# Patient Record
Sex: Female | Born: 1955 | Race: White | Hispanic: No | Smoking: Never smoker
Health system: Southern US, Community
[De-identification: ages and names within clinical notes are randomized; demographics above are authoritative.]

## PROBLEM LIST (undated history)

## (undated) DIAGNOSIS — I639 Cerebral infarction, unspecified: Secondary | ICD-10-CM

## (undated) DIAGNOSIS — F329 Major depressive disorder, single episode, unspecified: Secondary | ICD-10-CM

## (undated) DIAGNOSIS — G500 Trigeminal neuralgia: Secondary | ICD-10-CM

## (undated) DIAGNOSIS — Z8719 Personal history of other diseases of the digestive system: Secondary | ICD-10-CM

## (undated) DIAGNOSIS — T8859XA Other complications of anesthesia, initial encounter: Secondary | ICD-10-CM

## (undated) DIAGNOSIS — I10 Essential (primary) hypertension: Secondary | ICD-10-CM

## (undated) DIAGNOSIS — S52501A Unspecified fracture of the lower end of right radius, initial encounter for closed fracture: Secondary | ICD-10-CM

## (undated) DIAGNOSIS — IMO0002 Reserved for concepts with insufficient information to code with codable children: Secondary | ICD-10-CM

## (undated) DIAGNOSIS — M1990 Unspecified osteoarthritis, unspecified site: Secondary | ICD-10-CM

## (undated) DIAGNOSIS — K219 Gastro-esophageal reflux disease without esophagitis: Secondary | ICD-10-CM

## (undated) DIAGNOSIS — K3184 Gastroparesis: Secondary | ICD-10-CM

## (undated) DIAGNOSIS — K589 Irritable bowel syndrome without diarrhea: Secondary | ICD-10-CM

## (undated) DIAGNOSIS — R112 Nausea with vomiting, unspecified: Secondary | ICD-10-CM

## (undated) DIAGNOSIS — I341 Nonrheumatic mitral (valve) prolapse: Secondary | ICD-10-CM

## (undated) DIAGNOSIS — M329 Systemic lupus erythematosus, unspecified: Secondary | ICD-10-CM

## (undated) HISTORY — DX: Cerebral infarction, unspecified: I63.9

## (undated) HISTORY — PX: COLONOSCOPY: SHX174

## (undated) HISTORY — PX: CHOLECYSTECTOMY: SHX55

## (undated) HISTORY — PX: CARPAL TUNNEL RELEASE: SHX101

---

## 1998-02-03 ENCOUNTER — Other Ambulatory Visit: Admission: RE | Admit: 1998-02-03 | Discharge: 1998-02-03 | Payer: Self-pay | Admitting: Obstetrics and Gynecology

## 2000-12-12 ENCOUNTER — Encounter: Payer: Self-pay | Admitting: Family Medicine

## 2000-12-12 ENCOUNTER — Encounter: Admission: RE | Admit: 2000-12-12 | Discharge: 2000-12-12 | Payer: Self-pay | Admitting: Family Medicine

## 2001-03-05 ENCOUNTER — Encounter: Payer: Self-pay | Admitting: Occupational Medicine

## 2001-03-05 ENCOUNTER — Encounter: Admission: RE | Admit: 2001-03-05 | Discharge: 2001-03-05 | Payer: Self-pay | Admitting: Occupational Medicine

## 2001-05-01 ENCOUNTER — Encounter: Admission: RE | Admit: 2001-05-01 | Discharge: 2001-05-01 | Payer: Self-pay | Admitting: Family Medicine

## 2001-05-01 ENCOUNTER — Encounter: Payer: Self-pay | Admitting: Family Medicine

## 2001-10-02 ENCOUNTER — Encounter: Payer: Self-pay | Admitting: Rheumatology

## 2001-10-02 ENCOUNTER — Encounter: Admission: RE | Admit: 2001-10-02 | Discharge: 2001-10-02 | Payer: Self-pay | Admitting: Rheumatology

## 2002-02-17 ENCOUNTER — Other Ambulatory Visit: Admission: RE | Admit: 2002-02-17 | Discharge: 2002-02-17 | Payer: Self-pay | Admitting: *Deleted

## 2002-05-05 ENCOUNTER — Encounter: Payer: Self-pay | Admitting: Emergency Medicine

## 2002-05-05 ENCOUNTER — Observation Stay (HOSPITAL_COMMUNITY): Admission: EM | Admit: 2002-05-05 | Discharge: 2002-05-07 | Payer: Self-pay | Admitting: Emergency Medicine

## 2003-03-18 ENCOUNTER — Encounter: Admission: RE | Admit: 2003-03-18 | Discharge: 2003-03-18 | Payer: Self-pay | Admitting: Family Medicine

## 2003-03-18 ENCOUNTER — Encounter: Payer: Self-pay | Admitting: Family Medicine

## 2003-04-02 ENCOUNTER — Encounter
Admission: RE | Admit: 2003-04-02 | Discharge: 2003-07-01 | Payer: Self-pay | Admitting: Physical Medicine & Rehabilitation

## 2004-03-22 ENCOUNTER — Encounter: Admission: RE | Admit: 2004-03-22 | Discharge: 2004-03-22 | Payer: Self-pay | Admitting: Family Medicine

## 2012-07-07 ENCOUNTER — Encounter (HOSPITAL_BASED_OUTPATIENT_CLINIC_OR_DEPARTMENT_OTHER): Payer: Self-pay

## 2012-07-07 ENCOUNTER — Emergency Department (HOSPITAL_BASED_OUTPATIENT_CLINIC_OR_DEPARTMENT_OTHER)
Admission: EM | Admit: 2012-07-07 | Discharge: 2012-07-07 | Disposition: A | Discharge status: Home / Self Care | Payer: No Typology Code available for payment source | Attending: Emergency Medicine | Admitting: Emergency Medicine

## 2012-07-07 DIAGNOSIS — Z882 Allergy status to sulfonamides status: Secondary | ICD-10-CM | POA: Insufficient documentation

## 2012-07-07 DIAGNOSIS — I10 Essential (primary) hypertension: Secondary | ICD-10-CM | POA: Insufficient documentation

## 2012-07-07 DIAGNOSIS — F3130 Bipolar disorder, current episode depressed, mild or moderate severity, unspecified: Secondary | ICD-10-CM | POA: Insufficient documentation

## 2012-07-07 DIAGNOSIS — K529 Noninfective gastroenteritis and colitis, unspecified: Secondary | ICD-10-CM

## 2012-07-07 DIAGNOSIS — Z880 Allergy status to penicillin: Secondary | ICD-10-CM | POA: Insufficient documentation

## 2012-07-07 DIAGNOSIS — K5289 Other specified noninfective gastroenteritis and colitis: Secondary | ICD-10-CM | POA: Insufficient documentation

## 2012-07-07 HISTORY — DX: Gastroparesis: K31.84

## 2012-07-07 HISTORY — DX: Essential (primary) hypertension: I10

## 2012-07-07 HISTORY — DX: Irritable bowel syndrome, unspecified: K58.9

## 2012-07-07 HISTORY — DX: Systemic lupus erythematosus, unspecified: M32.9

## 2012-07-07 HISTORY — DX: Reserved for concepts with insufficient information to code with codable children: IMO0002

## 2012-07-07 LAB — URINALYSIS, ROUTINE W REFLEX MICROSCOPIC
Hgb urine dipstick: NEGATIVE
Leukocytes, UA: NEGATIVE
Nitrite: NEGATIVE
Specific Gravity, Urine: 1.019 (ref 1.005–1.030)
Urobilinogen, UA: 0.2 mg/dL (ref 0.0–1.0)

## 2012-07-07 LAB — CBC WITH DIFFERENTIAL/PLATELET
Eosinophils Absolute: 0.1 10*3/uL (ref 0.0–0.7)
Lymphocytes Relative: 4 % — ABNORMAL LOW (ref 12–46)
Lymphs Abs: 0.4 10*3/uL — ABNORMAL LOW (ref 0.7–4.0)
MCH: 29.6 pg (ref 26.0–34.0)
Neutrophils Relative %: 90 % — ABNORMAL HIGH (ref 43–77)
Platelets: 315 10*3/uL (ref 150–400)
RBC: 4.02 MIL/uL (ref 3.87–5.11)
WBC: 11.3 10*3/uL — ABNORMAL HIGH (ref 4.0–10.5)

## 2012-07-07 LAB — BASIC METABOLIC PANEL
GFR calc non Af Amer: >90 mL/min (ref >90)
Glucose, Bld: 155 mg/dL — ABNORMAL HIGH (ref 70–99)
Potassium: 4.3 mEq/L (ref 3.5–5.1)
Sodium: 132 mEq/L — ABNORMAL LOW (ref 135–145)

## 2012-07-07 MED ORDER — LOPERAMIDE HCL 2 MG PO CAPS
4.0000 mg | ORAL_CAPSULE | Freq: Once | ORAL | Status: AC
  Administered 2012-07-07: 4 mg via ORAL
  Filled 2012-07-07 (×2): qty 1

## 2012-07-07 MED ORDER — SODIUM CHLORIDE 0.9 % IV BOLUS (SEPSIS)
1000.0000 mL | Freq: Once | INTRAVENOUS | Status: AC
  Administered 2012-07-07: 1000 mL via INTRAVENOUS

## 2012-07-07 MED ORDER — PROMETHAZINE HCL 25 MG PO TABS: 25.0000 mg | ORAL_TABLET | Freq: Four times a day (QID) | ORAL | Status: DC | PRN

## 2012-07-07 MED ORDER — ONDANSETRON HCL 4 MG/2ML IJ SOLN
4.0000 mg | Freq: Once | INTRAMUSCULAR | Status: AC
  Administered 2012-07-07: 4 mg via INTRAVENOUS
  Filled 2012-07-07: qty 2

## 2012-07-07 NOTE — ED Provider Notes (Signed)
History     CSN: [id number]  Arrival date & time 07/07/12  0546   First MD Initiated Contact with Patient 07/07/12 [phone]      Chief Complaint  Patient presents with  . Emesis    (Consider location/radiation/quality/duration/timing/severity/associated sxs/prior treatment) Patient is a 56 y.o. female presenting with vomiting. The history is provided by the patient.  Emesis   She had onset yesterday of diarrhea which was followed by nausea and vomiting. This is associated with abdominal cramping which is moderate in intensity. She rates her abdominal pain at 4/10. Abdominal pain and improves following a bowel movement the following vomiting. She denies any hematemesis or hematochezia. She denies fever, chills, sweats. Another family member has developed diarrhea without nausea and vomiting. Symptoms are severe. Nothing makes it better nothing makes it worse. She was unable to take any medications for her symptoms at home because of the vomiting.  Past Medical History  Diagnosis Date  . Lupus   . Irritable bowel disease   . Bipolar 1 disorder, depressed   . Hypertension     History reviewed. No pertinent past surgical history.  No family history on file.  History  Substance Use Topics  . Smoking status: Never Smoker   . Smokeless tobacco: Not on file  . Alcohol Use: No    OB History    Grav Para Term Preterm Abortions TAB SAB Ect Mult Living                  Review of Systems  Gastrointestinal: Positive for vomiting.  All other systems reviewed and are negative.    Allergies  Sulfa antibiotics and Penicillins  Home Medications   Current Outpatient Rx  Name Route Sig Dispense Refill  . ACYCLOVIR 800 MG PO TABS Oral Take 1,000 mg by mouth once.    . ALPRAZOLAM 0.5 MG PO TABS Oral Take 0.5 mg by mouth at bedtime as needed.    . CARBAMAZEPINE ER 300 MG PO CP12 Oral Take 300 mg by mouth 3 (three) times daily.    Marland Kitchen ESOMEPRAZOLE MAGNESIUM 40 MG PO CPDR Oral Take 40 mg  by mouth Nightly.    Marland Kitchen HYDROXYZINE PAMOATE 25 MG PO CAPS Oral Take 25 mg by mouth 3 (three) times daily as needed.    Marland Kitchen LISINOPRIL-HYDROCHLOROTHIAZIDE 20-12.5 MG PO TABS Oral Take 1 tablet by mouth daily.    Marland Kitchen RANITIDINE HCL 300 MG PO TABS Oral Take 300 mg by mouth every morning.    . TRAZODONE HCL 150 MG PO TABS Oral Take 150 mg by mouth at bedtime.    . VENLAFAXINE HCL ER 150 MG PO CP24 Oral Take 150 mg by mouth daily.      BP 123/61  Pulse 91  Temp 97.8 F (36.6 C) (Oral)  Resp 20  SpO2 100%  Physical Exam  Nursing note and vitals reviewed. 56 year old female, resting comfortably and in no acute distress. Vital signs are normal. Oxygen saturation is 100%, which is normal. Head is normocephalic and atraumatic. PERRLA, EOMI. Oropharynx is clear. Neck is nontender and supple without adenopathy or JVD. Back is nontender and there is no CVA tenderness. Lungs are clear without rales, wheezes, or rhonchi. Chest is nontender. Heart has regular rate and rhythm without murmur. Abdomen is soft, flat, nontender without masses or hepatosplenomegaly and peristalsis is hypoactive. Extremities have no cyanosis or edema, full range of motion is present. Skin is warm and dry without rash. Neurologic: Mental status is normal,  cranial nerves are intact, there are no motor or sensory deficits.   ED Course  Procedures (including critical care time)  Results for orders placed during the hospital encounter of 07/07/12  CBC WITH DIFFERENTIAL      Component Value Range   WBC 11.3 (*) 4.0 - 10.5 K/uL   RBC 4.02  3.87 - 5.11 MIL/uL   Hemoglobin 11.9 (*) 12.0 - 15.0 g/dL   HCT 09.8 (*) 11.9 - 14.7 %   MCV 84.1  78.0 - 100.0 fL   MCH 29.6  26.0 - 34.0 pg   MCHC 35.2  30.0 - 36.0 g/dL   RDW 82.9  56.2 - 13.0 %   Platelets 315  150 - 400 K/uL   Neutrophils Relative 90 (*) 43 - 77 %   Neutro Abs 10.2 (*) 1.7 - 7.7 K/uL   Lymphocytes Relative 4 (*) 12 - 46 %   Lymphs Abs 0.4 (*) 0.7 - 4.0 K/uL    Monocytes Relative 5  3 - 12 %   Monocytes Absolute 0.6  0.1 - 1.0 K/uL   Eosinophils Relative 0  0 - 5 %   Eosinophils Absolute 0.1  0.0 - 0.7 K/uL   Basophils Relative 0  0 - 1 %   Basophils Absolute 0.0  0.0 - 0.1 K/uL  BASIC METABOLIC PANEL      Component Value Range   Sodium 132 (*) 135 - 145 mEq/L   Potassium 4.3  3.5 - 5.1 mEq/L   Chloride 95 (*) 96 - 112 mEq/L   CO2 24  19 - 32 mEq/L   Glucose, Bld 155 (*) 70 - 99 mg/dL   BUN 16  6 - 23 mg/dL   Creatinine, Ser 8.65  0.50 - 1.10 mg/dL   Calcium 9.1  8.4 - 78.4 mg/dL   GFR calc non Af Amer >90  >90 mL/min   GFR calc Af Amer >90  >90 mL/min    1. Gastroenteritis       MDM  Gastroenteritis which most likely is viral. She'll be given IV hydration and IV ondansetron and oral loperamide.  She feels much better after IV hydration and IV ondansetron. Laboratory workup is unremarkable. Urinalysis is pending. She will be discharged after urinalysis results are available.      Dione Booze, MD 07/07/12 [phone]

## 2012-07-07 NOTE — ED Notes (Signed)
Report reced form JB rn- pt states feels much better. Unable to give urine. MD aware

## 2012-07-07 NOTE — ED Notes (Signed)
Patient reports that she developed nausea, vomiting and diarrhea 18 hours ago. Reports abdominal cramping for same.

## 2014-03-28 ENCOUNTER — Encounter (HOSPITAL_BASED_OUTPATIENT_CLINIC_OR_DEPARTMENT_OTHER): Payer: Self-pay | Admitting: Emergency Medicine

## 2014-03-28 ENCOUNTER — Emergency Department (HOSPITAL_BASED_OUTPATIENT_CLINIC_OR_DEPARTMENT_OTHER): Payer: BC Managed Care – PPO

## 2014-03-28 ENCOUNTER — Emergency Department (HOSPITAL_BASED_OUTPATIENT_CLINIC_OR_DEPARTMENT_OTHER)
Admission: EM | Admit: 2014-03-28 | Discharge: 2014-03-29 | Disposition: A | Discharge status: Home / Self Care | Payer: BC Managed Care – PPO | Attending: Emergency Medicine | Admitting: Emergency Medicine

## 2014-03-28 DIAGNOSIS — F329 Major depressive disorder, single episode, unspecified: Secondary | ICD-10-CM | POA: Insufficient documentation

## 2014-03-28 DIAGNOSIS — F3289 Other specified depressive episodes: Secondary | ICD-10-CM | POA: Insufficient documentation

## 2014-03-28 DIAGNOSIS — Y9389 Activity, other specified: Secondary | ICD-10-CM | POA: Insufficient documentation

## 2014-03-28 DIAGNOSIS — Y9289 Other specified places as the place of occurrence of the external cause: Secondary | ICD-10-CM | POA: Insufficient documentation

## 2014-03-28 DIAGNOSIS — S8010XA Contusion of unspecified lower leg, initial encounter: Secondary | ICD-10-CM | POA: Insufficient documentation

## 2014-03-28 DIAGNOSIS — I10 Essential (primary) hypertension: Secondary | ICD-10-CM | POA: Insufficient documentation

## 2014-03-28 DIAGNOSIS — IMO0002 Reserved for concepts with insufficient information to code with codable children: Secondary | ICD-10-CM | POA: Insufficient documentation

## 2014-03-28 DIAGNOSIS — Z880 Allergy status to penicillin: Secondary | ICD-10-CM | POA: Insufficient documentation

## 2014-03-28 DIAGNOSIS — Z23 Encounter for immunization: Secondary | ICD-10-CM | POA: Insufficient documentation

## 2014-03-28 DIAGNOSIS — Z8719 Personal history of other diseases of the digestive system: Secondary | ICD-10-CM | POA: Insufficient documentation

## 2014-03-28 DIAGNOSIS — Z79899 Other long term (current) drug therapy: Secondary | ICD-10-CM | POA: Insufficient documentation

## 2014-03-28 HISTORY — DX: Major depressive disorder, single episode, unspecified: F32.9

## 2014-03-28 MED ORDER — BACITRACIN 500 UNIT/GM EX OINT
1.0000 "application " | TOPICAL_OINTMENT | Freq: Two times a day (BID) | CUTANEOUS | Status: DC
  Administered 2014-03-28: 1 via TOPICAL
  Filled 2014-03-28: qty 0.9

## 2014-03-28 MED ORDER — HYDROCODONE-ACETAMINOPHEN 5-325 MG PO TABS: 2.0000 | ORAL_TABLET | ORAL | Status: DC | PRN

## 2014-03-28 MED ORDER — OXYCODONE-ACETAMINOPHEN 5-325 MG PO TABS
1.0000 | ORAL_TABLET | Freq: Once | ORAL | Status: AC
  Administered 2014-03-28: 1 via ORAL
  Filled 2014-03-28: qty 1

## 2014-03-28 MED ORDER — TETANUS-DIPHTH-ACELL PERTUSSIS 5-2.5-18.5 LF-MCG/0.5 IM SUSP
0.5000 mL | Freq: Once | INTRAMUSCULAR | Status: AC
  Administered 2014-03-28: 0.5 mL via INTRAMUSCULAR
  Filled 2014-03-28: qty 0.5

## 2014-03-28 NOTE — ED Provider Notes (Signed)
CSN: [id number]     Arrival date & time 03/28/14  2001 History   First MD Initiated Contact with Patient 03/28/14 2143     Chief Complaint  Patient presents with  . Foot Injury     (Consider location/radiation/quality/duration/timing/severity/associated sxs/prior Treatment) Patient is a 58 y.o. female presenting with foot injury. The history is provided by the patient.  Foot Injury Location:  Ankle and leg Injury: yes   Mechanism of injury: fall   Fall:    Fall occurred:  Down stairs and tripped   Impact surface:  Grass   Entrapped after fall: no   Ankle location:  R ankle Pain details:    Severity:  Moderate   Onset quality:  Sudden   Timing:  Constant   Progression:  Worsening Chronicity:  New Dislocation: no   Foreign body present:  No foreign bodies Prior injury to area:  Yes Worsened by:  Activity and bearing weight Associated symptoms: decreased ROM and swelling    Debbie Hatfield is a 58 y.o. female who presents to the ED with bilateral lower extremity injuries. She tripped over a fence today and went forward. She went down 2 steps and hit her right lower leg/ankle and left lower leg. She has swelling, bruising and tenderness to bilateral lower legs.   Past Medical History  Diagnosis Date  . Lupus   . Irritable bowel disease   . Hypertension   . Gastroparesis   . Major depression    Past Surgical History  Procedure Laterality Date  . Cholecystectomy     History reviewed. No pertinent family history. History  Substance Use Topics  . Smoking status: Never Smoker   . Smokeless tobacco: Not on file  . Alcohol Use: No   OB History   Grav Para Term Preterm Abortions TAB SAB Ect Mult Living                 Review of Systems Negative except as stated in HPI   Allergies  Sulfa antibiotics and Penicillins  Home Medications   Prior to Admission medications   Medication Sig Start Date End Date Taking? Authorizing Provider  acyclovir (ZOVIRAX) 800 MG  tablet Take 1,000 mg by mouth once.    Historical Provider, MD  ALPRAZolam Prudy Feeler) 0.5 MG tablet Take 0.5 mg by mouth at bedtime as needed.    Historical Provider, MD  carbamazepine (CARBATROL) 300 MG 12 hr capsule Take 300 mg by mouth 3 (three) times daily.    Historical Provider, MD  esomeprazole (NEXIUM) 40 MG capsule Take 40 mg by mouth Nightly.    Historical Provider, MD  hydrOXYzine (VISTARIL) 25 MG capsule Take 25 mg by mouth 3 (three) times daily as needed.    Historical Provider, MD  lisinopril-hydrochlorothiazide (PRINZIDE,ZESTORETIC) 20-12.5 MG per tablet Take 1 tablet by mouth daily.    Historical Provider, MD  promethazine (PHENERGAN) 25 MG tablet Take 1 tablet (25 mg total) by mouth every 6 (six) hours as needed for nausea. 07/07/12   Dione Booze, MD  ranitidine (ZANTAC) 300 MG tablet Take 300 mg by mouth every morning.    Historical Provider, MD  traZODone (DESYREL) 150 MG tablet Take 150 mg by mouth at bedtime.    Historical Provider, MD  venlafaxine XR (EFFEXOR-XR) 150 MG 24 hr capsule Take 150 mg by mouth daily.    Historical Provider, MD   BP 135/70  Pulse 74  Temp(Src) 98.3 F (36.8 C) (Oral)  Resp 16  SpO2 99% Physical  Exam  Nursing note and vitals reviewed. Constitutional: She is oriented to person, place, and time. She appears well-developed and well-nourished.  HENT:  Head: Normocephalic.  Eyes: Conjunctivae and EOM are normal.  Neck: Neck supple.  Cardiovascular: Normal rate.   Pulmonary/Chest: Effort normal.  Musculoskeletal:       Right ankle: She exhibits swelling. Lacerations: abrasion. Tenderness.       Left lower leg: She exhibits tenderness, bony tenderness and swelling. She exhibits no deformity and no laceration.       Legs:      Feet:  Tenderness and abrasions to the right ankle and lower leg.  Tenderness to left lower leg with swelling and ecchymosis noted. Pedal pulses equal bilateral, adequate circulation, good touch sensation.   Neurological:  She is alert and oriented to person, place, and time. No cranial nerve deficit.  Skin: Skin is warm and dry.  Psychiatric: She has a normal mood and affect. Her behavior is normal.    ED Course  Procedures Dg Tibia/fibula Left  03/28/2014   CLINICAL DATA:  Fall, injury.  EXAM: LEFT TIBIA AND FIBULA - 2 VIEW  COMPARISON:  None.  FINDINGS: There is no evidence of fracture or other focal bone lesions. Soft tissues are unremarkable.  IMPRESSION: Negative.   Electronically Signed   By: Awilda Metroourtnay  Bloomer   On: 03/28/2014 22:36   Dg Ankle Complete Right  03/28/2014   CLINICAL DATA:  Fall, pain.  EXAM: RIGHT ANKLE - COMPLETE 3+ VIEW  COMPARISON:  None.  FINDINGS: No fracture deformity nor dislocation. The ankle mortise appears congruent and the tibiofibular syndesmosis intact. Mild tibiotalar osteoarthrosis. No destructive bony lesions. Soft tissue planes are non-suspicious.  IMPRESSION: No acute fracture deformity or dislocation.   Electronically Signed   By: Awilda Metroourtnay  Bloomer   On: 03/28/2014 22:35    MDM  58 y.o. female with contusions to bilateral lower legs and contusion and abrasion to the right ankle. Stable for discharge with no fracture noted on x-ray and no neurovascular deficits. Ace wrap applied to left lower leg. Right ankle wounds cleaned, bacitracin ointment and dressing followed by ace wrap. Tetanus up dated, crutches, pain management. Patient will follow up with her PCP or return here for any problems. I have reviewed this patient's vital signs, nurses notes, appropriate labs and imaging.  I have discussed findings and plan of care with the patient. She voices understanding.    Medication List    TAKE these medications       HYDROcodone-acetaminophen 5-325 MG per tablet  Commonly known as:  NORCO/VICODIN  Take 2 tablets by mouth every 4 (four) hours as needed.      ASK your doctor about these medications       acyclovir 800 MG tablet  Commonly known as:  ZOVIRAX  Take 1,000 mg  by mouth once.     ALPRAZolam 0.5 MG tablet  Commonly known as:  XANAX  Take 0.5 mg by mouth at bedtime as needed.     carbamazepine 300 MG 12 hr capsule  Commonly known as:  CARBATROL  Take 300 mg by mouth 3 (three) times daily.     esomeprazole 40 MG capsule  Commonly known as:  NEXIUM  Take 40 mg by mouth Nightly.     hydrOXYzine 25 MG capsule  Commonly known as:  VISTARIL  Take 25 mg by mouth 3 (three) times daily as needed.     lisinopril-hydrochlorothiazide 20-12.5 MG per tablet  Commonly known as:  PRINZIDE,ZESTORETIC  Take 1 tablet by mouth daily.     promethazine 25 MG tablet  Commonly known as:  PHENERGAN  Take 1 tablet (25 mg total) by mouth every 6 (six) hours as needed for nausea.     ranitidine 300 MG tablet  Commonly known as:  ZANTAC  Take 300 mg by mouth every morning.     traZODone 150 MG tablet  Commonly known as:  DESYREL  Take 150 mg by mouth at bedtime.     venlafaxine XR 150 MG 24 hr capsule  Commonly known as:  EFFEXOR-XR  Take 150 mg by mouth daily.           [address]Hope DeerfieldM Neese, [place]NP 03/29/14 [phone]

## 2014-03-28 NOTE — Discharge Instructions (Signed)
Follow up with your doctor. Return here as needed for problems. °

## 2014-03-28 NOTE — ED Notes (Signed)
Pt reports while stepping ovefr dog fence twisted ankle and foot

## 2014-03-29 NOTE — ED Provider Notes (Signed)
Medical screening examination/treatment/procedure(s) were performed by non-physician practitioner and as supervising physician I was immediately available for consultation/collaboration.   EKG Interpretation None        Junius ArgyleForrest S Tine Mabee, MD 03/29/14 1545

## 2015-12-14 DIAGNOSIS — K279 Peptic ulcer, site unspecified, unspecified as acute or chronic, without hemorrhage or perforation: Secondary | ICD-10-CM

## 2015-12-14 DIAGNOSIS — M8580 Other specified disorders of bone density and structure, unspecified site: Secondary | ICD-10-CM

## 2015-12-14 HISTORY — DX: Peptic ulcer, site unspecified, unspecified as acute or chronic, without hemorrhage or perforation: K27.9

## 2015-12-14 HISTORY — DX: Other specified disorders of bone density and structure, unspecified site: M85.80

## 2016-06-01 DIAGNOSIS — M359 Systemic involvement of connective tissue, unspecified: Secondary | ICD-10-CM | POA: Insufficient documentation

## 2016-08-09 DIAGNOSIS — R000 Tachycardia, unspecified: Secondary | ICD-10-CM | POA: Insufficient documentation

## 2016-08-22 ENCOUNTER — Encounter (HOSPITAL_COMMUNITY): Payer: Self-pay | Admitting: Family Medicine

## 2016-08-22 ENCOUNTER — Ambulatory Visit (HOSPITAL_COMMUNITY)
Admission: EM | Admit: 2016-08-22 | Discharge: 2016-08-22 | Disposition: A | Discharge status: Home / Self Care | Payer: Managed Care, Other (non HMO) | Attending: Family Medicine | Admitting: Family Medicine

## 2016-08-22 DIAGNOSIS — B029 Zoster without complications: Secondary | ICD-10-CM

## 2016-08-22 HISTORY — DX: Trigeminal neuralgia: G50.0

## 2016-08-22 MED ORDER — VALACYCLOVIR HCL 1 G PO TABS: 1000.0000 mg | ORAL_TABLET | Freq: Three times a day (TID) | ORAL | 0 refills | Status: AC

## 2016-08-22 NOTE — ED Triage Notes (Signed)
Pt here for rash under left eye for a few days. sts she thinks its shingles and took extra dose of acyclovir.

## 2016-08-22 NOTE — Discharge Instructions (Signed)
I believe this is a very mild shingles outbreak based on the distribution, the paresthesias that you're feeling in the skin, and the fact that you have a shingles vaccine in the past. This shouldn't last more than 10 days and should be very mild.

## 2016-08-22 NOTE — ED Provider Notes (Signed)
[facility]    CSN: [id number] Arrival date & time: 08/22/16  1004     History   Chief Complaint Chief Complaint  Patient presents with  . Rash    HPI Debbie Hatfield is a 60 y.o. female.   This a 60 year old woman who presents with rash. She has a history of hypertension, reflux esophagitis, lupus, and trigeminal neuralgia.  She's had a rash on her face for the last 3 days. Is characterized by a tingling and burning in the skin as well as papular features. She has no scalp pain or tenderness. She has no ear pain. She's had a shingles shot in the past. She takes acyclovir daily for HSV.  Patient works as a Engineer, civil (consulting)nurse in admitting.      Past Medical History:  Diagnosis Date  . Gastroparesis   . Hypertension   . Irritable bowel disease   . Lupus   . Major depression   . Trigeminal neuralgia     There are no active problems to display for this patient.   Past Surgical History:  Procedure Laterality Date  . CHOLECYSTECTOMY      OB History    No data available       Home Medications    Prior to Admission medications   Medication Sig Start Date End Date Taking? Authorizing Provider  acyclovir (ZOVIRAX) 800 MG tablet Take 1,000 mg by mouth once.    Historical Provider, MD  carbamazepine (CARBATROL) 300 MG 12 hr capsule Take 300 mg by mouth 3 (three) times daily.    Historical Provider, MD  esomeprazole (NEXIUM) 40 MG capsule Take 40 mg by mouth Nightly.    Historical Provider, MD  HYDROcodone-acetaminophen (NORCO/VICODIN) 5-325 MG per tablet Take 2 tablets by mouth every 4 (four) hours as needed. 03/28/14   Hope Orlene OchM Neese, NP  hydrOXYzine (VISTARIL) 25 MG capsule Take 25 mg by mouth Nightly.     Historical Provider, MD  lisinopril-hydrochlorothiazide (PRINZIDE,ZESTORETIC) 20-12.5 MG per tablet Take 1 tablet by mouth daily.    Historical Provider, MD  promethazine (PHENERGAN) 25 MG tablet Take 1 tablet (25 mg total) by mouth every 6 (six) hours as needed for  nausea. 07/07/12   Dione Boozeavid Glick, MD  ranitidine (ZANTAC) 300 MG tablet Take 300 mg by mouth every morning.    Historical Provider, MD  valACYclovir (VALTREX) 1000 MG tablet Take 1 tablet (1,000 mg total) by mouth 3 (three) times daily. 08/22/16 09/05/16  Elvina SidleKurt Graylyn Bunney, MD  venlafaxine XR (EFFEXOR-XR) 150 MG 24 hr capsule Take 75 mg by mouth daily.     Historical Provider, MD    Family History History reviewed. No pertinent family history.  Social History Social History  Substance Use Topics  . Smoking status: Never Smoker  . Smokeless tobacco: Never Used  . Alcohol use No     Allergies   Sulfa antibiotics and Penicillins   Review of Systems Review of Systems  Constitutional: Negative.   HENT: Negative.   Eyes: Negative for pain, discharge, redness, itching and visual disturbance.  Respiratory: Negative.   Cardiovascular: Negative.   Neurological:       Patient has paresthesias on him under the left eye     Physical Exam Triage Vital Signs ED Triage Vitals [08/22/16 1033]  Enc Vitals Group     BP 141/76     Pulse Rate 73     Resp 18     Temp 98.1 F (36.7 C)     Temp src  SpO2 100 %     Weight      Height      Head Circumference      Peak Flow      Pain Score 4     Pain Loc      Pain Edu?      Excl. in GC?    No data found.   Updated Vital Signs BP 141/76   Pulse 73   Temp 98.1 F (36.7 C)   Resp 18   SpO2 100%      Physical Exam  Constitutional: She is oriented to person, place, and time. She appears well-developed and well-nourished.  HENT:  Patient has a dermatomal orientation to a mild papular rash which extends from the bridge of the nose under the left eye with some mild swelling there as well. She has no tenderness or rash in her scalp or ear.  Musculoskeletal: Normal range of motion.  Neurological: She is alert and oriented to person, place, and time. No cranial nerve deficit. Coordination normal.  Skin: Skin is warm and dry. Rash  noted.  Nursing note and vitals reviewed.    UC Treatments / Results  Labs (all labs ordered are listed, but only abnormal results are displayed) Labs Reviewed - No data to display  EKG  EKG Interpretation None       Radiology No results found.  Procedures Procedures (including critical care time)  Medications Ordered in UC Medications - No data to display   Initial Impression / Assessment and Plan / UC Course  I have reviewed the triage vital signs and the nursing notes.  Pertinent labs & imaging results that were available during my care of the patient were reviewed by me and considered in my medical decision making (see chart for details).  Clinical Course     Final Clinical Impressions(s) / UC Diagnoses   Final diagnoses:  Herpes zoster without complication    New Prescriptions New Prescriptions   VALACYCLOVIR (VALTREX) 1000 MG TABLET    Take 1 tablet (1,000 mg total) by mouth 3 (three) times daily.     Elvina SidleKurt Ahava Kissoon, MD 08/22/16 1048

## 2017-06-05 DIAGNOSIS — F339 Major depressive disorder, recurrent, unspecified: Secondary | ICD-10-CM | POA: Insufficient documentation

## 2019-03-04 DIAGNOSIS — M5416 Radiculopathy, lumbar region: Secondary | ICD-10-CM | POA: Insufficient documentation

## 2019-10-16 HISTORY — PX: LOOP RECORDER IMPLANT: SHX5954

## 2020-01-26 DIAGNOSIS — M51369 Other intervertebral disc degeneration, lumbar region without mention of lumbar back pain or lower extremity pain: Secondary | ICD-10-CM

## 2020-01-26 DIAGNOSIS — M5136 Other intervertebral disc degeneration, lumbar region: Secondary | ICD-10-CM | POA: Insufficient documentation

## 2020-01-26 HISTORY — DX: Other intervertebral disc degeneration, lumbar region without mention of lumbar back pain or lower extremity pain: M51.369

## 2020-09-14 ENCOUNTER — Emergency Department (HOSPITAL_COMMUNITY): Payer: Worker's Compensation

## 2020-09-14 ENCOUNTER — Emergency Department (HOSPITAL_COMMUNITY)
Admission: EM | Admit: 2020-09-14 | Discharge: 2020-09-14 | Disposition: A | Discharge status: Home / Self Care | Payer: Worker's Compensation | Attending: Emergency Medicine | Admitting: Emergency Medicine

## 2020-09-14 ENCOUNTER — Encounter (HOSPITAL_COMMUNITY): Payer: Self-pay | Admitting: Student

## 2020-09-14 ENCOUNTER — Other Ambulatory Visit: Payer: Self-pay

## 2020-09-14 DIAGNOSIS — S52571A Other intraarticular fracture of lower end of right radius, initial encounter for closed fracture: Secondary | ICD-10-CM | POA: Diagnosis not present

## 2020-09-14 DIAGNOSIS — W19XXXA Unspecified fall, initial encounter: Secondary | ICD-10-CM

## 2020-09-14 DIAGNOSIS — Y92009 Unspecified place in unspecified non-institutional (private) residence as the place of occurrence of the external cause: Secondary | ICD-10-CM | POA: Insufficient documentation

## 2020-09-14 DIAGNOSIS — W108XXA Fall (on) (from) other stairs and steps, initial encounter: Secondary | ICD-10-CM | POA: Insufficient documentation

## 2020-09-14 DIAGNOSIS — T148XXA Other injury of unspecified body region, initial encounter: Secondary | ICD-10-CM

## 2020-09-14 DIAGNOSIS — Z79899 Other long term (current) drug therapy: Secondary | ICD-10-CM | POA: Insufficient documentation

## 2020-09-14 DIAGNOSIS — I10 Essential (primary) hypertension: Secondary | ICD-10-CM | POA: Insufficient documentation

## 2020-09-14 DIAGNOSIS — Y990 Civilian activity done for income or pay: Secondary | ICD-10-CM | POA: Insufficient documentation

## 2020-09-14 DIAGNOSIS — S59911A Unspecified injury of right forearm, initial encounter: Secondary | ICD-10-CM | POA: Diagnosis present

## 2020-09-14 DIAGNOSIS — S62101A Fracture of unspecified carpal bone, right wrist, initial encounter for closed fracture: Secondary | ICD-10-CM

## 2020-09-14 NOTE — ED Provider Notes (Signed)
[facility]-EMERGENCY DEPT Provider Note   CSN: [id number] Arrival date & time: 09/14/20  1841     History Chief Complaint  Patient presents with  . Fall  . Arm Injury    Debbie Hatfield is a 64 y.o. female.  HPI Patient is a 63 year old female with a medical history as noted below. 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 She reports sudden onset, moderate, pain along the right wrist. Pain worsens with movement. Associated mild swelling in the region. No numbness, tingling, weakness. No head trauma or LOC.    Past Medical History:  Diagnosis Date  . Gastroparesis   . Hypertension   . Irritable bowel disease   . Lupus (HCC)   . Major depression   . Trigeminal neuralgia     There are no problems to display for this patient.   Past Surgical History:  Procedure Laterality Date  . CHOLECYSTECTOMY       OB History   No obstetric history on file.     History reviewed. No pertinent family history.  Social History   Tobacco Use  . Smoking status: Never Smoker  . Smokeless tobacco: Never Used  Substance Use Topics  . Alcohol use: No  . Drug use: Not on file    Home Medications Prior to Admission medications   Medication Sig Start Date End Date Taking? Authorizing Provider  acyclovir (ZOVIRAX) 800 MG tablet Take 1,000 mg by mouth once.    [provider]  carbamazepine (CARBATROL) 300 MG 12 hr capsule Take 300 mg by mouth 3 (three) times daily.    [provider]  esomeprazole (NEXIUM) 40 MG capsule Take 40 mg by mouth Nightly.    [provider]  HYDROcodone-acetaminophen (NORCO/VICODIN) 5-325 MG per tablet Take 2 tablets by mouth every 4 (four) hours as needed. 03/28/14   Janne Napoleon, NP  hydrOXYzine (VISTARIL) 25 MG capsule Take 25 mg by mouth Nightly.     [provider]   lisinopril-hydrochlorothiazide (PRINZIDE,ZESTORETIC) 20-12.5 MG per tablet Take 1 tablet by mouth daily.    [provider]  promethazine (PHENERGAN) 25 MG tablet Take 1 tablet (25 mg total) by mouth every 6 (six) hours as needed for nausea. 07/07/12   Dione Booze, MD  ranitidine (ZANTAC) 300 MG tablet Take 300 mg by mouth every morning.    [provider]  venlafaxine XR (EFFEXOR-XR) 150 MG 24 hr capsule Take 75 mg by mouth daily.     [provider]    Allergies    Sulfa antibiotics and Penicillins  Review of Systems   Review of Systems  Musculoskeletal: Positive for arthralgias and joint swelling.  Skin: Negative for wound.  Neurological: Negative for weakness and numbness.   Physical Exam Updated Vital Signs BP (!) 202/84 (BP Location: Left Arm)   Pulse 75   Temp 98.4 F (36.9 C) (Oral)   Resp 16   Ht 5\' 2"  (1.575 m)   Wt 68 kg   SpO2 100%   BMI 27.44 kg/m   Physical Exam Vitals and nursing note reviewed.  Constitutional:      General: She is not in acute distress.    Appearance: She is well-developed.  HENT:     Head: Normocephalic and atraumatic.     Right Ear: External ear normal.     Left Ear: External ear normal.  Eyes:     General: No scleral icterus.       Right eye: No discharge.        Left eye: No discharge.     Conjunctiva/sclera: Conjunctivae normal.  Neck:     Trachea: No tracheal deviation.  Cardiovascular:     Rate and Rhythm: Normal rate.  Pulmonary:     Effort: Pulmonary effort is normal. No respiratory distress.     Breath sounds: No stridor.  Abdominal:     General: There is no distension.  Musculoskeletal:        General: Swelling and tenderness present. No deformity.     Cervical back: Neck supple.     Comments: Exquisite TTP overlying the radial aspect of the right wrist. Mild hematoma noted overlying the region. Unable to assess range of motion secondary to pain. Distal sensation intact. Good cap refill.   Skin:    General: Skin is warm and dry.     Findings: No rash.  Neurological:     Mental Status: She is alert.     Cranial Nerves: Cranial nerve deficit: no gross deficits.    ED Results / Procedures / Treatments   Labs (all labs ordered are listed, but only abnormal results are displayed) Labs Reviewed - No data to display  EKG None  Radiology DG Forearm Right  Result Date: 09/14/2020 CLINICAL DATA:  Fall, pain EXAM: RIGHT FOREARM - 2 VIEW; RIGHT WRIST - COMPLETE 3+ VIEW COMPARISON:  None. FINDINGS: Comminuted impacted fracture of the distal radial metaphysis with intra-articular extension to the radiocarpal joint and possible extension into the distal radioulnar joint as well. Question a tiny nondisplaced fracture of the ulnar styloid process. No other acute fracture or traumatic osseous injury is identified. Mild degenerative changes in the wrist. Carpal bones remain in expected alignment. Dorsal ossicle noted along the first carpal arc. Circumferential soft tissue swelling of the wrist. No additional proximal radial or ulnar fractures are identified. No additional swelling or soft tissue abnormality seen in the proximal forearm. Alignment at the elbow is grossly maintained. Minimal enthesopathic changes along medial epicondyle. IMPRESSION: 1. Comminuted impacted fracture of the distal radial metaphysis with intra-articular extension to the radiocarpal joint and possible extension into the distal radioulnar joint. 2. Question tiny nondisplaced fracture of the ulnar styloid process. 3. No other acute fracture or traumatic osseous injury. 4. Circumferential swelling. 5. Degenerative changes and ossicles as above. Electronically Signed   By: Kreg Shropshire M.D.   On: 09/14/2020 19:25   DG Wrist Complete Right  Result Date: 09/14/2020 CLINICAL DATA:  Fall, pain EXAM: RIGHT FOREARM - 2 VIEW; RIGHT WRIST - COMPLETE 3+ VIEW COMPARISON:  None. FINDINGS: Comminuted impacted fracture of the distal  radial metaphysis with intra-articular extension to the radiocarpal joint and possible extension into the distal radioulnar joint as well. Question a tiny nondisplaced fracture of the ulnar styloid process. No other acute fracture or traumatic osseous injury is identified. Mild degenerative changes in the wrist. Carpal bones remain in expected alignment. Dorsal ossicle noted along the first carpal arc. Circumferential soft tissue swelling of the wrist. No additional proximal radial or ulnar fractures are identified. No additional swelling or soft tissue abnormality seen in the proximal forearm. Alignment at the elbow is grossly maintained. Minimal enthesopathic changes along medial epicondyle. IMPRESSION: 1. Comminuted impacted fracture of the distal radial metaphysis with intra-articular extension to the radiocarpal joint and possible extension into the distal radioulnar joint. 2. Question tiny nondisplaced fracture of the  ulnar styloid process. 3. No other acute fracture or traumatic osseous injury. 4. Circumferential swelling. 5. Degenerative changes and ossicles as above. Electronically Signed   By: Kreg Shropshire M.D.   On: 09/14/2020 19:25   Procedures Procedures   Medications Ordered in ED Medications - No data to display  ED Course  I have reviewed the triage vital signs and the nursing notes.  Pertinent labs & imaging results that were available during my care of the patient were reviewed by me and considered in my medical decision making (see chart for details).    MDM Rules/Calculators/A&P                          Patient is a 64 year old female who presents to the emergency department with a right wrist fracture. Findings noted above which were personally reviewed and interpreted.  I spoke to Dr. Roney Mans who is on-call for hand surgery. Recommended we place patient in a sugar tong splint and have her call the office tomorrow. They can evaluate her Friday of this week or Tuesday of next  week. This was discussed with the patient and she confirms that she will call tomorrow and is planning on going to their office this Friday.  Patient has Ultram for pain at home and plans on using this as prescribed. Knows to return to the emergency department with any new or worsening symptoms. Her questions were answered and she was amicable at the time of discharge.   Final Clinical Impression(s) / ED Diagnoses Final diagnoses:  Fall, initial encounter  Impacted comminuted fracture  Closed fracture of right wrist, initial encounter   Rx / DC Orders ED Discharge Orders    None       Placido Sou, PA-C 09/14/20 2042    Gwyneth Sprout, MD 09/18/20 0900

## 2020-09-14 NOTE — Progress Notes (Signed)
Orthopedic Tech Progress Note Patient Details:  Debbie Hatfield [birth year] [id number]  Ortho Devices Type of Ortho Device: Ace wrap, Sugartong splint Ortho Device/Splint Location: splint and sling to RUE Ortho Device/Splint Interventions: Ordered, Application   Post Interventions Patient Tolerated: Well Instructions Provided: Care of device   [name] 09/14/2020, 9:03 PM

## 2020-09-14 NOTE — ED Triage Notes (Signed)
Patient BIB POV after a fall.  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  Patient has what she believes is a fx right arm, 8/10 pain.

## 2020-09-14 NOTE — Discharge Instructions (Signed)
Like we discussed, please follow-up with Dr. Candi Leash at [facility]. They are expecting your call tomorrow. They can see you in the office this Friday or next Tuesday.  If you develop any new or worsening symptoms please return the emergency department for reevaluation. It was a pleasure to meet you.

## 2020-09-15 DIAGNOSIS — M79641 Pain in right hand: Secondary | ICD-10-CM | POA: Insufficient documentation

## 2020-09-19 ENCOUNTER — Encounter (HOSPITAL_BASED_OUTPATIENT_CLINIC_OR_DEPARTMENT_OTHER): Payer: Self-pay | Admitting: Orthopaedic Surgery

## 2020-09-19 ENCOUNTER — Encounter (HOSPITAL_BASED_OUTPATIENT_CLINIC_OR_DEPARTMENT_OTHER)
Admission: RE | Admit: 2020-09-19 | Discharge: 2020-09-19 | Disposition: A | Discharge status: Home / Self Care | Payer: PRIVATE HEALTH INSURANCE | Source: Ambulatory Visit | Attending: Orthopaedic Surgery | Admitting: Orthopaedic Surgery

## 2020-09-19 ENCOUNTER — Other Ambulatory Visit: Payer: Self-pay

## 2020-09-19 ENCOUNTER — Other Ambulatory Visit (HOSPITAL_COMMUNITY)
Admission: RE | Admit: 2020-09-19 | Discharge: 2020-09-19 | Disposition: A | Discharge status: Home / Self Care | Payer: PRIVATE HEALTH INSURANCE | Source: Ambulatory Visit | Attending: Orthopaedic Surgery | Admitting: Orthopaedic Surgery

## 2020-09-19 DIAGNOSIS — Z20822 Contact with and (suspected) exposure to covid-19: Secondary | ICD-10-CM | POA: Diagnosis not present

## 2020-09-19 DIAGNOSIS — Z01812 Encounter for preprocedural laboratory examination: Secondary | ICD-10-CM | POA: Diagnosis present

## 2020-09-19 DIAGNOSIS — I10 Essential (primary) hypertension: Secondary | ICD-10-CM | POA: Insufficient documentation

## 2020-09-19 DIAGNOSIS — M329 Systemic lupus erythematosus, unspecified: Secondary | ICD-10-CM | POA: Insufficient documentation

## 2020-09-19 DIAGNOSIS — Z01818 Encounter for other preprocedural examination: Secondary | ICD-10-CM | POA: Insufficient documentation

## 2020-09-19 LAB — BASIC METABOLIC PANEL
Anion gap: 9 (ref 5–15)
BUN: 15 mg/dL (ref 8–23)
CO2: 28 mmol/L (ref 22–32)
Calcium: 8.9 mg/dL (ref 8.9–10.3)
Chloride: 100 mmol/L (ref 98–111)
Creatinine, Ser: 1.05 mg/dL — ABNORMAL HIGH (ref 0.44–1.00)
GFR, Estimated: 60 mL/min — ABNORMAL LOW (ref >60)
Glucose, Bld: 92 mg/dL (ref 70–99)
Potassium: 4 mmol/L (ref 3.5–5.1)
Sodium: 137 mmol/L (ref 135–145)

## 2020-09-19 LAB — SARS CORONAVIRUS 2 (TAT 6-24 HRS): SARS Coronavirus 2: NEGATIVE

## 2020-09-19 NOTE — Progress Notes (Signed)

## 2020-09-22 ENCOUNTER — Encounter (HOSPITAL_BASED_OUTPATIENT_CLINIC_OR_DEPARTMENT_OTHER): Payer: Self-pay | Admitting: Orthopaedic Surgery

## 2020-09-22 ENCOUNTER — Ambulatory Visit (HOSPITAL_BASED_OUTPATIENT_CLINIC_OR_DEPARTMENT_OTHER): Payer: Worker's Compensation | Admitting: Anesthesiology

## 2020-09-22 ENCOUNTER — Encounter (HOSPITAL_BASED_OUTPATIENT_CLINIC_OR_DEPARTMENT_OTHER)
Admission: RE | Disposition: A | Discharge status: Home / Self Care | Payer: Self-pay | Source: Home / Self Care | Attending: Orthopaedic Surgery

## 2020-09-22 ENCOUNTER — Ambulatory Visit (HOSPITAL_BASED_OUTPATIENT_CLINIC_OR_DEPARTMENT_OTHER)
Admission: RE | Admit: 2020-09-22 | Discharge: 2020-09-22 | Disposition: A | Discharge status: Home / Self Care | Payer: Worker's Compensation | Attending: Orthopaedic Surgery | Admitting: Orthopaedic Surgery

## 2020-09-22 ENCOUNTER — Other Ambulatory Visit: Payer: Self-pay

## 2020-09-22 DIAGNOSIS — Z882 Allergy status to sulfonamides status: Secondary | ICD-10-CM | POA: Insufficient documentation

## 2020-09-22 DIAGNOSIS — S52571A Other intraarticular fracture of lower end of right radius, initial encounter for closed fracture: Secondary | ICD-10-CM | POA: Insufficient documentation

## 2020-09-22 DIAGNOSIS — Z79899 Other long term (current) drug therapy: Secondary | ICD-10-CM | POA: Diagnosis not present

## 2020-09-22 DIAGNOSIS — M329 Systemic lupus erythematosus, unspecified: Secondary | ICD-10-CM | POA: Insufficient documentation

## 2020-09-22 DIAGNOSIS — I10 Essential (primary) hypertension: Secondary | ICD-10-CM | POA: Insufficient documentation

## 2020-09-22 DIAGNOSIS — Z880 Allergy status to penicillin: Secondary | ICD-10-CM | POA: Insufficient documentation

## 2020-09-22 DIAGNOSIS — W19XXXA Unspecified fall, initial encounter: Secondary | ICD-10-CM | POA: Diagnosis not present

## 2020-09-22 DIAGNOSIS — Y939 Activity, unspecified: Secondary | ICD-10-CM | POA: Diagnosis not present

## 2020-09-22 HISTORY — PX: ORIF WRIST FRACTURE: SHX2133

## 2020-09-22 HISTORY — DX: Unspecified fracture of the lower end of right radius, initial encounter for closed fracture: S52.501A

## 2020-09-22 HISTORY — DX: Gastro-esophageal reflux disease without esophagitis: K21.9

## 2020-09-22 SURGERY — OPEN REDUCTION INTERNAL FIXATION (ORIF) WRIST FRACTURE
Anesthesia: Monitor Anesthesia Care | Site: Wrist | Laterality: Right

## 2020-09-22 MED ORDER — PROPOFOL 500 MG/50ML IV EMUL
INTRAVENOUS | Status: AC
  Filled 2020-09-22: qty 50

## 2020-09-22 MED ORDER — CEFAZOLIN SODIUM-DEXTROSE 2-4 GM/100ML-% IV SOLN
INTRAVENOUS | Status: AC
  Filled 2020-09-22: qty 100

## 2020-09-22 MED ORDER — PROPOFOL 500 MG/50ML IV EMUL
INTRAVENOUS | Status: DC | PRN
  Administered 2020-09-22: 125 ug/kg/min via INTRAVENOUS

## 2020-09-22 MED ORDER — LACTATED RINGERS IV SOLN: INTRAVENOUS | Status: DC

## 2020-09-22 MED ORDER — FENTANYL CITRATE (PF) 100 MCG/2ML IJ SOLN
INTRAMUSCULAR | Status: AC
  Filled 2020-09-22: qty 2

## 2020-09-22 MED ORDER — ACETAMINOPHEN 500 MG PO TABS
ORAL_TABLET | ORAL | Status: AC
  Filled 2020-09-22: qty 2

## 2020-09-22 MED ORDER — MIDAZOLAM HCL 2 MG/2ML IJ SOLN
INTRAMUSCULAR | Status: AC
  Filled 2020-09-22: qty 2

## 2020-09-22 MED ORDER — FENTANYL CITRATE (PF) 100 MCG/2ML IJ SOLN: 25.0000 ug | INTRAMUSCULAR | Status: DC | PRN

## 2020-09-22 MED ORDER — POVIDONE-IODINE 10 % EX SWAB
2.0000 "application " | Freq: Once | CUTANEOUS | Status: AC
  Administered 2020-09-22: 2 via TOPICAL

## 2020-09-22 MED ORDER — DEXAMETHASONE SODIUM PHOSPHATE 10 MG/ML IJ SOLN
INTRAMUSCULAR | Status: AC
  Filled 2020-09-22: qty 1

## 2020-09-22 MED ORDER — HYDROCODONE-ACETAMINOPHEN 5-325 MG PO TABS
1.0000 | ORAL_TABLET | Freq: Four times a day (QID) | ORAL | 0 refills | Status: DC | PRN
Start: 2020-09-22 — End: 2021-01-12

## 2020-09-22 MED ORDER — ACETAMINOPHEN 500 MG PO TABS
1000.0000 mg | ORAL_TABLET | Freq: Once | ORAL | Status: AC
  Administered 2020-09-22: 1000 mg via ORAL

## 2020-09-22 MED ORDER — LIDOCAINE 2% (20 MG/ML) 5 ML SYRINGE
INTRAMUSCULAR | Status: AC
  Filled 2020-09-22: qty 5

## 2020-09-22 MED ORDER — ONDANSETRON HCL 4 MG/2ML IJ SOLN
INTRAMUSCULAR | Status: DC | PRN
  Administered 2020-09-22: 4 mg via INTRAVENOUS

## 2020-09-22 MED ORDER — MIDAZOLAM HCL 2 MG/2ML IJ SOLN
2.0000 mg | Freq: Once | INTRAMUSCULAR | Status: AC
  Administered 2020-09-22: 2 mg via INTRAVENOUS

## 2020-09-22 MED ORDER — CEFAZOLIN SODIUM-DEXTROSE 2-4 GM/100ML-% IV SOLN
2.0000 g | INTRAVENOUS | Status: AC
  Administered 2020-09-22: 2 g via INTRAVENOUS

## 2020-09-22 MED ORDER — LIDOCAINE HCL (CARDIAC) PF 100 MG/5ML IV SOSY
PREFILLED_SYRINGE | INTRAVENOUS | Status: DC | PRN
  Administered 2020-09-22: 40 mg via INTRAVENOUS

## 2020-09-22 MED ORDER — FENTANYL CITRATE (PF) 100 MCG/2ML IJ SOLN
INTRAMUSCULAR | Status: DC | PRN
  Administered 2020-09-22 (×2): 50 ug via INTRAVENOUS

## 2020-09-22 MED ORDER — BUPIVACAINE-EPINEPHRINE (PF) 0.5% -1:200000 IJ SOLN
INTRAMUSCULAR | Status: DC | PRN
  Administered 2020-09-22: 30 mL via PERINEURAL

## 2020-09-22 MED ORDER — FENTANYL CITRATE (PF) 100 MCG/2ML IJ SOLN
50.0000 ug | Freq: Once | INTRAMUSCULAR | Status: AC
  Administered 2020-09-22: 50 ug via INTRAVENOUS

## 2020-09-22 MED ORDER — CHLORHEXIDINE GLUCONATE 4 % EX LIQD: 60.0000 mL | Freq: Once | CUTANEOUS | Status: DC

## 2020-09-22 MED ORDER — ONDANSETRON HCL 4 MG/2ML IJ SOLN
INTRAMUSCULAR | Status: AC
  Filled 2020-09-22: qty 2

## 2020-09-22 SURGICAL SUPPLY — 81 items
APL PRP STRL LF DISP 70% ISPRP (MISCELLANEOUS) ×1
BIT DRILL 2.0 LNG QUCK RELEASE (BIT) ×1 IMPLANT
BIT DRILL QC 2.8X5 (BIT) ×3 IMPLANT
BLADE CLIPPER SURG (BLADE) IMPLANT
BLADE SURG 15 STRL LF DISP TIS (BLADE) ×2 IMPLANT
BLADE SURG 15 STRL SS (BLADE) ×6
BNDG CMPR 9X4 STRL LF SNTH (GAUZE/BANDAGES/DRESSINGS) ×1
BNDG CONFORM 2 STRL LF (GAUZE/BANDAGES/DRESSINGS) IMPLANT
BNDG ELASTIC 3X5.8 VLCR STR LF (GAUZE/BANDAGES/DRESSINGS) ×3 IMPLANT
BNDG ELASTIC 4X5.8 VLCR STR LF (GAUZE/BANDAGES/DRESSINGS) IMPLANT
BNDG ESMARK 4X9 LF (GAUZE/BANDAGES/DRESSINGS) ×3 IMPLANT
BNDG GAUZE ELAST 4 BULKY (GAUZE/BANDAGES/DRESSINGS) ×6 IMPLANT
BRUSH SCRUB EZ PLAIN DRY (MISCELLANEOUS) IMPLANT
CANISTER SUCT 1200ML W/VALVE (MISCELLANEOUS) ×3 IMPLANT
CHLORAPREP W/TINT 26 (MISCELLANEOUS) ×3 IMPLANT
CORD BIPOLAR FORCEPS 12FT (ELECTRODE) ×3 IMPLANT
COVER BACK TABLE 60X90IN (DRAPES) ×3 IMPLANT
COVER WAND RF STERILE (DRAPES) IMPLANT
CUFF TOURN SGL QUICK 18X4 (TOURNIQUET CUFF) ×3 IMPLANT
DECANTER SPIKE VIAL GLASS SM (MISCELLANEOUS) IMPLANT
DRAPE EXTREMITY T 121X128X90 (DISPOSABLE) ×3 IMPLANT
DRAPE OEC MINIVIEW 54X84 (DRAPES) ×3 IMPLANT
DRAPE SURG 17X23 STRL (DRAPES) ×3 IMPLANT
DRILL 2.0 LNG QUICK RELEASE (BIT) ×3
DRSG ADAPTIC 3X8 NADH LF (GAUZE/BANDAGES/DRESSINGS) IMPLANT
DRSG EMULSION OIL 3X3 NADH (GAUZE/BANDAGES/DRESSINGS) IMPLANT
GAUZE 4X4 16PLY RFD (DISPOSABLE) IMPLANT
GAUZE SPONGE 4X4 12PLY STRL (GAUZE/BANDAGES/DRESSINGS) ×3 IMPLANT
GAUZE XEROFORM 1X8 LF (GAUZE/BANDAGES/DRESSINGS) ×3 IMPLANT
GAUZE XEROFORM 5X9 LF (GAUZE/BANDAGES/DRESSINGS) IMPLANT
GLOVE ECLIPSE 6.5 STRL STRAW (GLOVE) ×3 IMPLANT
GLOVE SRG 8 PF TXTR STRL LF DI (GLOVE) ×1 IMPLANT
GLOVE SURG SYN 7.5  E (GLOVE)
GLOVE SURG SYN 7.5 E (GLOVE) IMPLANT
GLOVE SURG UNDER POLY LF SZ8 (GLOVE) ×3
GOWN STRL REUS W/ TWL LRG LVL3 (GOWN DISPOSABLE) ×1 IMPLANT
GOWN STRL REUS W/ TWL XL LVL3 (GOWN DISPOSABLE) ×1 IMPLANT
GOWN STRL REUS W/TWL LRG LVL3 (GOWN DISPOSABLE) ×3
GOWN STRL REUS W/TWL XL LVL3 (GOWN DISPOSABLE) ×3
GUIDEWIRE ORTHO 0.054X6 (WIRE) ×3 IMPLANT
NEEDLE HYPO 22GX1.5 SAFETY (NEEDLE) IMPLANT
NEEDLE HYPO 25X1 1.5 SAFETY (NEEDLE) IMPLANT
NS IRRIG 1000ML POUR BTL (IV SOLUTION) ×3 IMPLANT
PACK BASIN DAY SURGERY FS (CUSTOM PROCEDURE TRAY) ×3 IMPLANT
PAD CAST 3X4 CTTN HI CHSV (CAST SUPPLIES) ×2 IMPLANT
PAD CAST 4YDX4 CTTN HI CHSV (CAST SUPPLIES) ×1 IMPLANT
PADDING CAST ABS 3INX4YD NS (CAST SUPPLIES)
PADDING CAST ABS 4INX4YD NS (CAST SUPPLIES) ×2
PADDING CAST ABS COTTON 3X4 (CAST SUPPLIES) IMPLANT
PADDING CAST ABS COTTON 4X4 ST (CAST SUPPLIES) ×1 IMPLANT
PADDING CAST COTTON 3X4 STRL (CAST SUPPLIES) ×6
PADDING CAST COTTON 4X4 STRL (CAST SUPPLIES) ×3
PLATE ACULOCK 2 NARROW RT (Plate) ×3 IMPLANT
SCREW BN FT 16X2.3XLCK HEX CRT (Screw) ×2 IMPLANT
SCREW CORT FT 18X2.3XLCK HEX (Screw) ×4 IMPLANT
SCREW CORTICAL LOCKING 2.3X16M (Screw) ×6 IMPLANT
SCREW CORTICAL LOCKING 2.3X18M (Screw) ×12 IMPLANT
SCREW LOCK 12X3.5X HEXALOBE (Screw) ×1 IMPLANT
SCREW LOCKING 3.5X10MM (Screw) ×3 IMPLANT
SCREW LOCKING 3.5X12 (Screw) ×3 IMPLANT
SCREW NON LOCK 3.5X10MM (Screw) ×3 IMPLANT
SHEET MEDIUM DRAPE 40X70 STRL (DRAPES) ×3 IMPLANT
SPLINT FIBERGLASS 3X35 (CAST SUPPLIES) ×3 IMPLANT
SPLINT FIBERGLASS 4X30 (CAST SUPPLIES) IMPLANT
SPLINT PLASTER CAST XFAST 3X15 (CAST SUPPLIES) IMPLANT
SPLINT PLASTER XTRA FASTSET 3X (CAST SUPPLIES)
STOCKINETTE SYNTHETIC 3 UNSTER (CAST SUPPLIES) ×3 IMPLANT
SUCTION FRAZIER HANDLE 10FR (MISCELLANEOUS) ×3
SUCTION TUBE FRAZIER 10FR DISP (MISCELLANEOUS) ×1 IMPLANT
SUT ETHIBOND 3-0 V-5 (SUTURE) IMPLANT
SUT PROLENE 4 0 PS 2 18 (SUTURE) ×6 IMPLANT
SUT VIC AB 3-0 FS2 27 (SUTURE) ×3 IMPLANT
SYR BULB EAR ULCER 3OZ GRN STR (SYRINGE) ×3 IMPLANT
SYR CONTROL 10ML LL (SYRINGE) IMPLANT
SYSTEM CHEST DRAIN TLS 7FR (DRAIN) IMPLANT
TAPE SURG TRANSPORE 1 IN (GAUZE/BANDAGES/DRESSINGS) ×1 IMPLANT
TAPE SURGICAL TRANSPORE 1 IN (GAUZE/BANDAGES/DRESSINGS) ×3
TOWEL GREEN STERILE FF (TOWEL DISPOSABLE) ×6 IMPLANT
TUBE CONNECTING 20'X1/4 (TUBING) ×1
TUBE CONNECTING 20X1/4 (TUBING) ×2 IMPLANT
UNDERPAD 30X36 HEAVY ABSORB (UNDERPADS AND DIAPERS) ×3 IMPLANT

## 2020-09-22 NOTE — Discharge Instructions (Signed)
Discharge Instructions  - Keep dressings in place. Do not remove them. - The dressings must stay dry - Take all medication as prescribed. Transition to over the counter pain medication as your pain improves - Keep the hand elevated over the next 48-72 hours to help with pain and swelling - Move all digits not restricted by the dressings regularly to prevent stiffness - Please call to schedule a follow up appointment with Dr. Roney Mans and therapy at [phone] for 10-14 days following surgery - Your pain medication have been sent digitally to your pharmacy   Post Anesthesia Home Care Instructions  Activity: Get plenty of rest for the remainder of the day. A responsible individual must stay with you for 24 hours following the procedure.  For the next 24 hours, DO NOT: -Drive a car -Advertising copywriter -Drink alcoholic beverages -Take any medication unless instructed by your physician -Make any legal decisions or sign important papers.  Meals: Start with liquid foods such as gelatin or soup. Progress to regular foods as tolerated. Avoid greasy, spicy, heavy foods. If nausea and/or vomiting occur, drink only clear liquids until the nausea and/or vomiting subsides. Call your physician if vomiting continues.  Special Instructions/Symptoms: Your throat may feel dry or sore from the anesthesia or the breathing tube placed in your throat during surgery. If this causes discomfort, gargle with warm salt water. The discomfort should disappear within 24 hours.  If you had a scopolamine patch placed behind your ear for the management of post- operative nausea and/or vomiting:  1. The medication in the patch is effective for 72 hours, after which it should be removed.  Wrap patch in a tissue and discard in the trash. Wash hands thoroughly with soap and water. 2. You may remove the patch earlier than 72 hours if you experience unpleasant side effects which may include dry mouth, dizziness or visual  disturbances. 3. Avoid touching the patch. Wash your hands with soap and water after contact with the patch.    Regional Anesthesia Blocks  1. Numbness or the inability to move the "blocked" extremity may last from 3-48 hours after placement. The length of time depends on the medication injected and your individual response to the medication. If the numbness is not going away after 48 hours, call your surgeon.  2. The extremity that is blocked will need to be protected until the numbness is gone and the  Strength has returned. Because you cannot feel it, you will need to take extra care to avoid injury. Because it may be weak, you may have difficulty moving it or using it. You may not know what position it is in without looking at it while the block is in effect.  3. For blocks in the legs and feet, returning to weight bearing and walking needs to be done carefully. You will need to wait until the numbness is entirely gone and the strength has returned. You should be able to move your leg and foot normally before you try and bear weight or walk. You will need someone to be with you when you first try to ensure you do not fall and possibly risk injury.  4. Bruising and tenderness at the needle site are common side effects and will resolve in a few days.  5. Persistent numbness or new problems with movement should be communicated to the surgeon or the [facility] [phone] [facility] [phone]).  NO TYLENOL BEFORE 4pm, IF NEEDED.

## 2020-09-22 NOTE — Transfer of Care (Signed)
Immediate Anesthesia Transfer of Care Note  Patient: Debbie Hatfield  Procedure(s) Performed: OPEN REDUCTION INTERNAL FIXATION (ORIF) WRIST FRACTURE (Right Wrist)  Patient Location: PACU  Anesthesia Type:MAC  Level of Consciousness: awake and alert   Airway & Oxygen Therapy: Patient Spontanous Breathing and Patient connected to nasal cannula oxygen  Post-op Assessment: Report given to RN and Post -op Vital signs reviewed and stable  Post vital signs: Reviewed and stable  Last Vitals:  Vitals Value Taken Time  BP    Temp    Pulse 87 09/22/20 1155  Resp 18 09/22/20 1155  SpO2 99 % 09/22/20 1155  Vitals shown include unvalidated device data.  Last Pain:  Vitals:   09/22/20 0906  TempSrc: Oral  PainSc: 4       Patients Stated Pain Goal: 2 (67/61/95 0932)  Complications: No complications documented.

## 2020-09-22 NOTE — Anesthesia Procedure Notes (Signed)
Anesthesia Regional Block: Supraclavicular block   Pre-Anesthetic Checklist: ,, timeout performed, Correct Patient, Correct Site, Correct Laterality, Correct Procedure, Correct Position, site marked, Risks and benefits discussed, pre-op evaluation,  At surgeon's request and post-op pain management  Laterality: Right  Prep: Maximum Sterile Barrier Precautions used, chloraprep       Needles:  Injection technique: Single-shot  Needle Type: Echogenic Stimulator Needle     Needle Length: 5cm  Needle Gauge: 22     Additional Needles:   Procedures:,,,, ultrasound used (permanent image in chart),,,,  Narrative:  Start time: 09/22/2020 9:46 AM End time: 09/22/2020 9:56 AM Injection made incrementally with aspirations every 5 mL. Anesthesiologist: Gaynelle Adu, MD  Additional Notes: 2% Lidocaine skin wheel.

## 2020-09-22 NOTE — Op Note (Signed)
PREOPERATIVE DIAGNOSIS: Displaced right three-part intra-articular distal radius fracture  POSTOPERATIVE DIAGNOSIS: Same  ATTENDING PHYSICIAN: Maudry Mayhew. Jeannie Fend, III, MD who was present and scrubbed for the entire case   ASSISTANT SURGEON: None.   ANESTHESIA: Regional with MAC  SURGICAL PROCEDURES: Open reduction and internal fixation of right three-part intra-articular distal radius fracture  SURGICAL INDICATIONS: Patient is a 64 year old female who last had a fall onto an outstretched right hand.  She had immediate pain in the right wrist and was seen at [facility] long ER.  She was found to have a intra-articular displaced radius fracture.  She was then seen by me in clinic.  We discussed treatment options going forward and due to the loss of articular height and inclination of the distal radius fracture, did recommend proceeding forward with open reduction and internal fixation to reestablish the normal contour of her articular surface.  After discussing treatment options she did wish to proceed forward with surgery and presents today for fixation of her right wrist.  FINDING: Anatomic alignment of the three-part intra-articular distal radius fracture was achieved and stable fixation using a volar locking plate screw construct.  DESCRIPTION OF PROCEDURE: Patient was identified in the preoperative holding area where the risk benefits and alternatives of the procedure were discussed with the patient.  These risks include but are not limited to infection, bleeding, damage to surrounding structures including blood vessels and nerves, pain, stiffness, malunion, nonunion, implant failure need for additional procedures.  After having this discussion informed consent was obtained the patient's right wrist was marked with a surgical marking pen.  She then underwent a right upper extremity plexus block by anesthesia.  She was then brought to the operative suite where timeout was performed identifying the  correct patient operative site.  Patient was positioned supine on the operative table and induced under MAC sedation.  Preoperative antibiotics were administered.  A tourniquet was placed on the upper arm and the right upper extremity was then prepped and draped in usual sterile fashion.  The limb was exsanguinated and the tourniquet was inflated.  A longitudinal incision was made over the volar radial wrist overlying the FCR tendon.  Sharp dissection was carried down through subcutaneous tissue.  The FCR tendon sheath was incised and the tendon was mobilized ulnarly.  The deep FCR tendon sheath was then incised and the FPL tendon was bluntly mobilized ulnarly as well.  15 blade was then used to incise the pronator quadratus which was then elevated off the volar distal radius using a wood handle elevator.  This revealed the distal radius fracture.  This was distal along the volar wrist with intra-articular split creating both a lunate and scaphoid fossa fragments.  Manipulation of these fragments was performed in anatomic alignment of the pieces was achieved using palmar translation of the hand and ulnar deviation of the wrist.  While holding this in position the narrow width distally fitting Acumed volar locking plate was placed along the volar distal radius and pinned in the position.  K wires were placed to help hold reduction.  A screw was then placed into the oblong hole into the proximal portion of the plate to allow for some manipulation of the plate into good position along the volar rim of the distal radius.  A distal row of locking screws were then placed while holding the distal radius in a reduced position.  Fluoroscopic images were then obtained including both AP and lateral images.  This showed anatomic reduction of the  intra-articular distal radius fracture with appropriate position of the volar locking plate and screws.  At this point the remaining holes in the distal row of the plate were filled  as where the two additional locking screws placed proximally into the radial shaft.  Additional fluoroscopic images were obtained which showed appropriate screw lengths throughout as well as appropriate plate and screw position along the volar distal radius.  The wrist was taken through series range of motion and found to have full flexion, extension, pronation and supination of the DRUJ was found to be stable in both pronation and supination.  The wound was then copiously irrigated with normal saline.  The skin was then closed with interrupted 4-0 Prolene sutures.  Xeroform, 4 x 4's and well-padded volar slab splint was then placed.  The tourniquet was released and the patient had return of brisk capillary refill to all of her digits.  She was awoken from her sedation and taken the PACU in stable condition.  She tolerated the procedure well and there were no complications.  RADIOGRAPHIC INTERPRETATION: AP, lateral and fossa lateral images were obtained intraoperative under fluoroscopy.  These show anatomic alignment of the previously displaced and intra-articular 3 part distal radius fracture with volar locking plate and screw construct.  No new fractures or dislocations are noted.  ESTIMATED BLOOD LOSS: Less than 10 mL  TOURNIQUET TIME: 30 minutes  SPECIMENS: None  POSTOPERATIVE PLAN: The patient will be discharged home and seen back  in the office in approximately 10-12 days for wound check, suture  removal, and then be sent to a therapist for volar wrist splint, early range of motion and edema control exercises.  IMPLANTS: Acumed distally fitting, narrow volar locking plate and screw construct.

## 2020-09-22 NOTE — Anesthesia Preprocedure Evaluation (Signed)
Anesthesia Evaluation  Patient identified by MRN, date of birth, ID band Patient awake    Reviewed: Allergy & Precautions, H&P , NPO status , Patient's Chart, lab work & pertinent test results  Airway Mallampati: II  TM Distance: >3 FB Neck ROM: Full    Dental no notable dental hx. (+) Teeth Intact, Dental Advisory Given   Pulmonary neg pulmonary ROS,    Pulmonary exam normal breath sounds clear to auscultation       Cardiovascular hypertension, Pt. on medications  Rhythm:Regular Rate:Normal     Neuro/Psych Depression negative neurological ROS  negative psych ROS   GI/Hepatic Neg liver ROS, GERD  Medicated,  Endo/Other  negative endocrine ROS  Renal/GU negative Renal ROS  negative genitourinary   Musculoskeletal   Abdominal   Peds  Hematology negative hematology ROS (+)   Anesthesia Other Findings   Reproductive/Obstetrics negative OB ROS                             Anesthesia Physical Anesthesia Plan  ASA: II  Anesthesia Plan: MAC and Regional   Post-op Pain Management:    Induction: Intravenous  PONV Risk Score and Plan: 2 and Propofol infusion, Ondansetron, Dexamethasone and Midazolam  Airway Management Planned: Simple Face Mask  Additional Equipment:   Intra-op Plan:   Post-operative Plan:   Informed Consent: I have reviewed the patients History and Physical, chart, labs and discussed the procedure including the risks, benefits and alternatives for the proposed anesthesia with the patient or authorized representative who has indicated his/her understanding and acceptance.     Dental advisory given  Plan Discussed with: CRNA  Anesthesia Plan Comments:         Anesthesia Quick Evaluation

## 2020-09-22 NOTE — Progress Notes (Signed)
Assisted Dr. Edmond Fitzgerald with right, ultrasound guided, supraclavicular block. Side rails up, monitors on throughout procedure. See vital signs in flow sheet. Tolerated Procedure well. 

## 2020-09-22 NOTE — Anesthesia Postprocedure Evaluation (Signed)
Anesthesia Post Note  Patient: Debbie Hatfield  Procedure(s) Performed: OPEN REDUCTION INTERNAL FIXATION (ORIF) WRIST FRACTURE (Right Wrist)     Patient location during evaluation: PACU Anesthesia Type: Regional and MAC Level of consciousness: awake and alert Pain management: pain level controlled Vital Signs Assessment: post-procedure vital signs reviewed and stable Respiratory status: spontaneous breathing, nonlabored ventilation and respiratory function stable Cardiovascular status: stable and blood pressure returned to baseline Postop Assessment: no apparent nausea or vomiting Anesthetic complications: no   No complications documented.  Last Vitals:  Vitals:   09/22/20 1200 09/22/20 1201  BP: (!) 155/76   Pulse: 80 80  Resp: 17 16  Temp: (!) 36.4 C   SpO2: 99% 99%    Last Pain:  Vitals:   09/22/20 1154  TempSrc:   PainSc: 0-No pain                 Imanuel Pruiett,[name]

## 2020-09-22 NOTE — H&P (Signed)
ORTHOPAEDIC H&P  PCP:  [facility]  Chief Complaint: Right wrist pain  HPI: Debbie Hatfield is a 64 y.o. female who complains of right wrist pain.  Patient fell last week onto outstretched right hand.  She had immediate pain and swelling to her right wrist and was seen at [facility] long ER.  She was found to have an impacted and displaced intra-articular distal radius fracture.  She was then sent to see me in clinic.  Based on the amount of displacement and step-off of her articular surface we discussed both operative and nonoperative treatment measures.  She did wish to proceed forward with surgical fixation of her right wrist and she presents today for that.  She states her pain is been controlled.  She denies numbness or tingling in the digits.  She has been n.p.o.  Past Medical History:  Diagnosis Date  . Closed fracture of right distal radius   . Gastroparesis   . GERD (gastroesophageal reflux disease)   . Hypertension   . Irritable bowel disease   . Lupus (HCC)   . Major depression   . Trigeminal neuralgia    Past Surgical History:  Procedure Laterality Date  . CHOLECYSTECTOMY    . COLONOSCOPY     Social History   Socioeconomic History  . Marital status: Divorced    Spouse name: Not on file  . Number of children: Not on file  . Years of education: Not on file  . Highest education level: Not on file  Occupational History  . Not on file  Tobacco Use  . Smoking status: Never Smoker  . Smokeless tobacco: Never Used  Substance and Sexual Activity  . Alcohol use: Yes    Comment: social  . Drug use: Never  . Sexual activity: Not Currently    Birth control/protection: Post-menopausal  Other Topics Concern  . Not on file  Social History Narrative  . Not on file   Social Determinants of Health   Financial Resource Strain: Not on file  Food Insecurity: Not on file  Transportation Needs: Not on file  Physical Activity: Not on file   Stress: Not on file  Social Connections: Not on file   History reviewed. No pertinent family history. Allergies  Allergen Reactions  . Sulfa Antibiotics Nausea And Vomiting  . Penicillins Rash   Prior to Admission medications   Medication Sig Start Date End Date Taking? Authorizing Provider  acyclovir (ZOVIRAX) 800 MG tablet Take 1,000 mg by mouth once.   Yes [provider]  carbamazepine (CARBATROL) 300 MG 12 hr capsule Take 300 mg by mouth daily.    Yes [provider]  esomeprazole (NEXIUM) 40 MG capsule Take 40 mg by mouth Nightly.   Yes [provider]  gabapentin (NEURONTIN) 300 MG capsule Take 600 mg by mouth 3 (three) times daily.   Yes [provider]  HYDROcodone-acetaminophen (NORCO/VICODIN) 5-325 MG per tablet Take 2 tablets by mouth every 4 (four) hours as needed. 03/28/14  Yes Neese, Hope M, NP  hydroxychloroquine (PLAQUENIL) 200 MG tablet Take 200 mg by mouth daily.   Yes [provider]  lisinopril-hydrochlorothiazide (PRINZIDE,ZESTORETIC) 20-12.5 MG per tablet Take 1 tablet by mouth daily.   Yes [provider]  traMADol (ULTRAM) 50 MG tablet Take 50 mg by mouth every 6 (six) hours as needed.   Yes [provider]  venlafaxine XR (EFFEXOR-XR) 150 MG 24 hr capsule Take 75 mg by mouth daily.  Yes [provider]   No results found.  Positive ROS: All other systems have been reviewed and were otherwise negative with the exception of those mentioned in the HPI and as above.  Physical Exam: General: Alert, no acute distress Cardiovascular: No edema Respiratory: No cyanosis, no use of accessory musculature Skin: No lesions in the area of chief complaint Psychiatric: Patient is competent for consent with normal mood and affect  MUSCULOSKELETAL: Sugar tong splint in place.  Intact motion through the digits.  Fingertips are warm well perfused with brisk capillary refill.  Her sensation is grossly  intact light touch throughout all digits.  Assessment: Displaced intra-articular right distal radius fracture  Plan: Proceed forward with open reduction and into fixation of the right distal radius fracture.  Risk, benefits and alternatives of the procedure were once again discussed with the patient.  These risks include but are not limited to infection, bleeding, damage to surrounding structures including blood vessels and nerves, pain, stiffness, malunion, nonunion, implant failure and need for additional procedures.  Informed consent was obtained the patient's right hand was marked.  Plan for discharge home postoperatively with follow-up with me in approximate 10 to 14 days.    Ernest Mallick, MD [phone]   09/22/2020 9:12 AM

## 2020-09-23 ENCOUNTER — Encounter (HOSPITAL_BASED_OUTPATIENT_CLINIC_OR_DEPARTMENT_OTHER): Payer: Self-pay | Admitting: Orthopaedic Surgery

## 2020-12-29 ENCOUNTER — Encounter (HOSPITAL_BASED_OUTPATIENT_CLINIC_OR_DEPARTMENT_OTHER): Payer: Self-pay | Admitting: *Deleted

## 2020-12-29 ENCOUNTER — Other Ambulatory Visit: Payer: Self-pay

## 2020-12-29 ENCOUNTER — Emergency Department (HOSPITAL_BASED_OUTPATIENT_CLINIC_OR_DEPARTMENT_OTHER): Payer: PRIVATE HEALTH INSURANCE

## 2020-12-29 ENCOUNTER — Emergency Department (HOSPITAL_BASED_OUTPATIENT_CLINIC_OR_DEPARTMENT_OTHER)
Admission: EM | Admit: 2020-12-29 | Discharge: 2020-12-29 | Disposition: A | Discharge status: Home / Self Care | Payer: PRIVATE HEALTH INSURANCE | Attending: Emergency Medicine | Admitting: Emergency Medicine

## 2020-12-29 DIAGNOSIS — Z23 Encounter for immunization: Secondary | ICD-10-CM | POA: Insufficient documentation

## 2020-12-29 DIAGNOSIS — S0990XA Unspecified injury of head, initial encounter: Secondary | ICD-10-CM | POA: Diagnosis present

## 2020-12-29 DIAGNOSIS — Z79899 Other long term (current) drug therapy: Secondary | ICD-10-CM | POA: Diagnosis not present

## 2020-12-29 DIAGNOSIS — W1812XA Fall from or off toilet with subsequent striking against object, initial encounter: Secondary | ICD-10-CM | POA: Insufficient documentation

## 2020-12-29 DIAGNOSIS — Y92002 Bathroom of unspecified non-institutional (private) residence single-family (private) house as the place of occurrence of the external cause: Secondary | ICD-10-CM | POA: Insufficient documentation

## 2020-12-29 DIAGNOSIS — I10 Essential (primary) hypertension: Secondary | ICD-10-CM | POA: Insufficient documentation

## 2020-12-29 DIAGNOSIS — R55 Syncope and collapse: Secondary | ICD-10-CM | POA: Diagnosis not present

## 2020-12-29 DIAGNOSIS — M542 Cervicalgia: Secondary | ICD-10-CM | POA: Diagnosis not present

## 2020-12-29 DIAGNOSIS — S0031XA Abrasion of nose, initial encounter: Secondary | ICD-10-CM

## 2020-12-29 DIAGNOSIS — R040 Epistaxis: Secondary | ICD-10-CM | POA: Diagnosis not present

## 2020-12-29 DIAGNOSIS — S0083XA Contusion of other part of head, initial encounter: Secondary | ICD-10-CM | POA: Insufficient documentation

## 2020-12-29 DIAGNOSIS — S0093XA Contusion of unspecified part of head, initial encounter: Secondary | ICD-10-CM

## 2020-12-29 LAB — BASIC METABOLIC PANEL
Anion gap: 9 (ref 5–15)
BUN: 16 mg/dL (ref 8–23)
CO2: 29 mmol/L (ref 22–32)
Calcium: 9.3 mg/dL (ref 8.9–10.3)
Chloride: 100 mmol/L (ref 98–111)
Creatinine, Ser: 0.89 mg/dL (ref 0.44–1.00)
GFR, Estimated: >60 mL/min (ref >60)
Glucose, Bld: 99 mg/dL (ref 70–99)
Potassium: 3.8 mmol/L (ref 3.5–5.1)
Sodium: 138 mmol/L (ref 135–145)

## 2020-12-29 LAB — CBC
HCT: 38.3 % (ref 36.0–46.0)
Hemoglobin: 12.4 g/dL (ref 12.0–15.0)
MCH: 28.7 pg (ref 26.0–34.0)
MCHC: 32.4 g/dL (ref 30.0–36.0)
MCV: 88.7 fL (ref 80.0–100.0)
Platelets: 295 10*3/uL (ref 150–400)
RBC: 4.32 MIL/uL (ref 3.87–5.11)
RDW: 13.1 % (ref 11.5–15.5)
WBC: 6.9 10*3/uL (ref 4.0–10.5)
nRBC: 0 % (ref 0.0–0.2)

## 2020-12-29 MED ORDER — BACITRACIN ZINC 500 UNIT/GM EX OINT
TOPICAL_OINTMENT | Freq: Once | CUTANEOUS | Status: AC
  Administered 2020-12-29: 1 via TOPICAL
  Filled 2020-12-29: qty 28.35

## 2020-12-29 MED ORDER — ACETAMINOPHEN 500 MG PO TABS
1000.0000 mg | ORAL_TABLET | Freq: Once | ORAL | Status: AC
  Administered 2020-12-29: 1000 mg via ORAL
  Filled 2020-12-29: qty 2

## 2020-12-29 MED ORDER — TETANUS-DIPHTH-ACELL PERTUSSIS 5-2.5-18.5 LF-MCG/0.5 IM SUSY
PREFILLED_SYRINGE | INTRAMUSCULAR | Status: AC
  Filled 2020-12-29: qty 0.5

## 2020-12-29 MED ORDER — TETANUS-DIPHTH-ACELL PERTUSSIS 5-2.5-18.5 LF-MCG/0.5 IM SUSY
0.5000 mL | PREFILLED_SYRINGE | Freq: Once | INTRAMUSCULAR | Status: AC
  Administered 2020-12-29: 0.5 mL via INTRAMUSCULAR

## 2020-12-29 NOTE — ED Notes (Signed)
Patient stated that she wants to do the wound care herself and her friend (both are nurses).  She just want to take the Bacitracin with her.

## 2020-12-29 NOTE — ED Triage Notes (Addendum)
Patient fainted last night while she was bearing down in her bathroom.  Patient also c/o being dizzy and nausea.

## 2020-12-29 NOTE — ED Provider Notes (Addendum)
[facility] EMERGENCY DEPT Provider Note   CSN: [id number] Arrival date & time: 12/29/20  4098     History Chief Complaint  Patient presents with  . Fall  . Dizziness  . Nausea    Debbie Hatfield is a 65 y.o. female.  Patient c/o syncope and fall last pm. 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 Brief loc. Was able to get up under own power. This AM, has been ambulatory. Called friend who brought to ED. C/o pain to back of head, superior neck, and face - symptoms acute onset, moderate, dull, worse w palpation. No radicular pain. No numbness/weakness. No nausea/vomiting. No change in vision. Nose bled initially, now stopped. No anticoag use. Prior to syncope/fall felt fine, at baseline. No palpitations. No chest pain or discomfort. No sob or unusual doe. No leg pain or swelling. No recent blood loss. No change of meds/new meds.   The history is provided by the patient.  Fall Pertinent negatives include no chest pain, no abdominal pain, no headaches and no shortness of breath.  Dizziness Associated symptoms: no chest pain, no headaches, no palpitations, no shortness of breath and no vomiting        Past Medical History:  Diagnosis Date  . Closed fracture of right distal radius   . Gastroparesis   . GERD (gastroesophageal reflux disease)   . Hypertension   . Irritable bowel disease   . Lupus (HCC)   . Major depression   . Trigeminal neuralgia     There are no problems to display for this patient.   Past Surgical History:  Procedure Laterality Date  . CHOLECYSTECTOMY    . COLONOSCOPY    . ORIF WRIST FRACTURE Right 09/22/2020   Procedure: OPEN REDUCTION INTERNAL FIXATION (ORIF) WRIST FRACTURE;  Surgeon: Ernest Mallick, MD;  Location: [facility];  Service: Orthopedics;  Laterality: Right;      OB History   No obstetric history on file.     History  reviewed. No pertinent family history.  Social History   Tobacco Use  . Smoking status: Never Smoker  . Smokeless tobacco: Never Used  Substance Use Topics  . Alcohol use: Yes    Comment: social  . Drug use: Never    Home Medications Prior to Admission medications   Medication Sig Start Date End Date Taking? Authorizing Provider  acyclovir (ZOVIRAX) 800 MG tablet Take 1,000 mg by mouth once.    [provider]  carbamazepine (CARBATROL) 300 MG 12 hr capsule Take 300 mg by mouth daily.     [provider]  esomeprazole (NEXIUM) 40 MG capsule Take 40 mg by mouth Nightly.    [provider]  gabapentin (NEURONTIN) 300 MG capsule Take 600 mg by mouth 3 (three) times daily.    [provider]  HYDROcodone-acetaminophen (NORCO/VICODIN) 5-325 MG tablet Take 1-2 tablets by mouth every 6 (six) hours as needed. 09/22/20   Cain Saupe III, MD  hydroxychloroquine (PLAQUENIL) 200 MG tablet Take 200 mg by mouth daily.    [provider]  lisinopril-hydrochlorothiazide (PRINZIDE,ZESTORETIC) 20-12.5 MG per tablet Take 1 tablet by mouth daily.    [provider]  traMADol (ULTRAM) 50 MG tablet Take 50 mg by mouth every 6 (six) hours as needed.    [provider]  venlafaxine XR (EFFEXOR-XR) 150 MG 24 hr capsule Take 75 mg by mouth daily.  [provider]    Allergies    Sulfa antibiotics and Penicillins  Review of Systems   Review of Systems  Constitutional: Negative for fever.  HENT: Positive for nosebleeds.   Eyes: Negative for pain and visual disturbance.  Respiratory: Negative for cough and shortness of breath.   Cardiovascular: Negative for chest pain and palpitations.  Gastrointestinal: Negative for abdominal pain and vomiting.  Genitourinary: Negative for dysuria and flank pain.  Musculoskeletal: Positive for neck pain. Negative for back pain.  Skin:       Contusion/abrasion to nose.   Neurological:  Positive for syncope and light-headedness. Negative for headaches.  Hematological: Does not bruise/bleed easily.  Psychiatric/Behavioral: Negative for confusion.    Physical Exam Updated Vital Signs BP (!) 143/123 (BP Location: Right Arm)   Pulse 84   Resp 15   Ht 1.575 m (5\' 2" )   Wt 68 kg   SpO2 100%   BMI 27.44 kg/m   Physical Exam Vitals and nursing note reviewed.  Constitutional:      Appearance: Normal appearance. She is well-developed.  HENT:     Head:     Comments: Contusion, sts/tenderness, to bridge or nose and right cheek/inferior orbit. No nasal septal hematoma. No malocclusion.     Right Ear: Tympanic membrane normal.     Left Ear: Tympanic membrane normal.     Nose: Nose normal.     Mouth/Throat:     Mouth: Mucous membranes are moist.  Eyes:     General: No scleral icterus.    Conjunctiva/sclera: Conjunctivae normal.     Pupils: Pupils are equal, round, and reactive to light.  Neck:     Vascular: No carotid bruit.     Trachea: No tracheal deviation.  Cardiovascular:     Rate and Rhythm: Normal rate and regular rhythm.     Pulses: Normal pulses.     Heart sounds: Normal heart sounds. No murmur heard. No friction rub. No gallop.   Pulmonary:     Effort: Pulmonary effort is normal. No respiratory distress.     Breath sounds: Normal breath sounds.  Chest:     Chest wall: No tenderness.  Abdominal:     General: Bowel sounds are normal. There is no distension.     Palpations: Abdomen is soft. There is no mass.     Tenderness: There is no abdominal tenderness.  Genitourinary:    Comments: No cva tenderness.  Musculoskeletal:        General: No swelling.     Cervical back: Normal range of motion and neck supple. Tenderness present. No rigidity. No muscular tenderness.     Comments: Tenderness upper cervical area, otherwise, CTLS spine, non tender, aligned, no step off. Good rom bil ext without pain or focal bony tenderness.   Skin:    General: Skin is  warm and dry.     Findings: No rash.  Neurological:     Mental Status: She is alert.     Comments: Alert, speech normal. GCS 15. Motor/sens grossly intact bil. Steady gait.   Psychiatric:        Mood and Affect: Mood normal.     ED Results / Procedures / Treatments   Labs (all labs ordered are listed, but only abnormal results are displayed) Results for orders placed or performed during the hospital encounter of 12/29/20  CBC  Result Value Ref Range   WBC 6.9 4.0 - 10.5 K/uL   RBC 4.32 3.87 - 5.11 MIL/uL  Hemoglobin 12.4 12.0 - 15.0 g/dL   HCT 16.1 09.6 - 04.5 %   MCV 88.7 80.0 - 100.0 fL   MCH 28.7 26.0 - 34.0 pg   MCHC 32.4 30.0 - 36.0 g/dL   RDW 40.9 81.1 - 91.4 %   Platelets 295 150 - 400 K/uL   nRBC 0.0 0.0 - 0.2 %  Basic metabolic panel  Result Value Ref Range   Sodium 138 135 - 145 mmol/L   Potassium 3.8 3.5 - 5.1 mmol/L   Chloride 100 98 - 111 mmol/L   CO2 29 22 - 32 mmol/L   Glucose, Bld 99 70 - 99 mg/dL   BUN 16 8 - 23 mg/dL   Creatinine, Ser 7.82 0.44 - 1.00 mg/dL   Calcium 9.3 8.9 - 95.6 mg/dL   GFR, Estimated >21 >30 mL/min   Anion gap 9 5 - 15   CT HEAD WO CONTRAST  Result Date: 12/29/2020 CLINICAL DATA:  Larey Seat last night with trauma to the head, face and neck. EXAM: CT HEAD WITHOUT CONTRAST CT MAXILLOFACIAL WITHOUT CONTRAST CT CERVICAL SPINE WITHOUT CONTRAST TECHNIQUE: Multidetector CT imaging of the head, cervical spine, and maxillofacial structures were performed using the standard protocol without intravenous contrast. Multiplanar CT image reconstructions of the cervical spine and maxillofacial structures were also generated. COMPARISON:  None. FINDINGS: CT HEAD FINDINGS Brain: The brain shows a normal appearance without evidence of malformation, atrophy, old or acute small or large vessel infarction, mass lesion, hemorrhage, hydrocephalus or extra-axial collection. Vascular: No hyperdense vessel. No evidence of atherosclerotic calcification. Skull: Normal.   No traumatic finding.  No focal bone lesion. Sinuses/Orbits: Sinuses are clear. Orbits appear normal. Mastoids are clear. Other: None significant CT MAXILLOFACIAL FINDINGS Osseous: No facial fracture. Orbits: No orbital injury. Sinuses: Clear Soft tissues: Soft tissue swelling of the nose. CT CERVICAL SPINE FINDINGS Alignment: No traumatic malalignment. Straightening of the normal cervical lordosis, which could simply be positional. Skull base and vertebrae: No regional fracture. Soft tissues and spinal canal: Normal Disc levels: No degenerative disc disease. No facet arthropathy. No stenosis. Upper chest: Normal Other: None IMPRESSION: HEAD CT: Normal. MAXILLOFACIAL CT: Soft tissue swelling of the nose. No facial fracture. CERVICAL SPINE CT: No traumatic finding. Straightening of the normal cervical lordosis, which could simply be positional. Electronically Signed   By: Paulina Fusi M.D.   On: 12/29/2020 11:33   CT CERVICAL SPINE WO CONTRAST  Result Date: 12/29/2020 CLINICAL DATA:  Larey Seat last night with trauma to the head, face and neck. EXAM: CT HEAD WITHOUT CONTRAST CT MAXILLOFACIAL WITHOUT CONTRAST CT CERVICAL SPINE WITHOUT CONTRAST TECHNIQUE: Multidetector CT imaging of the head, cervical spine, and maxillofacial structures were performed using the standard protocol without intravenous contrast. Multiplanar CT image reconstructions of the cervical spine and maxillofacial structures were also generated. COMPARISON:  None. FINDINGS: CT HEAD FINDINGS Brain: The brain shows a normal appearance without evidence of malformation, atrophy, old or acute small or large vessel infarction, mass lesion, hemorrhage, hydrocephalus or extra-axial collection. Vascular: No hyperdense vessel. No evidence of atherosclerotic calcification. Skull: Normal.  No traumatic finding.  No focal bone lesion. Sinuses/Orbits: Sinuses are clear. Orbits appear normal. Mastoids are clear. Other: None significant CT MAXILLOFACIAL FINDINGS  Osseous: No facial fracture. Orbits: No orbital injury. Sinuses: Clear Soft tissues: Soft tissue swelling of the nose. CT CERVICAL SPINE FINDINGS Alignment: No traumatic malalignment. Straightening of the normal cervical lordosis, which could simply be positional. Skull base and vertebrae: No regional fracture. Soft tissues and  spinal canal: Normal Disc levels: No degenerative disc disease. No facet arthropathy. No stenosis. Upper chest: Normal Other: None IMPRESSION: HEAD CT: Normal. MAXILLOFACIAL CT: Soft tissue swelling of the nose. No facial fracture. CERVICAL SPINE CT: No traumatic finding. Straightening of the normal cervical lordosis, which could simply be positional. Electronically Signed   By: Paulina FusiMark  Shogry M.D.   On: 12/29/2020 11:33   CT Maxillofacial WO CM  Result Date: 12/29/2020 CLINICAL DATA:  Larey SeatFell last night with trauma to the head, face and neck. EXAM: CT HEAD WITHOUT CONTRAST CT MAXILLOFACIAL WITHOUT CONTRAST CT CERVICAL SPINE WITHOUT CONTRAST TECHNIQUE: Multidetector CT imaging of the head, cervical spine, and maxillofacial structures were performed using the standard protocol without intravenous contrast. Multiplanar CT image reconstructions of the cervical spine and maxillofacial structures were also generated. COMPARISON:  None. FINDINGS: CT HEAD FINDINGS Brain: The brain shows a normal appearance without evidence of malformation, atrophy, old or acute small or large vessel infarction, mass lesion, hemorrhage, hydrocephalus or extra-axial collection. Vascular: No hyperdense vessel. No evidence of atherosclerotic calcification. Skull: Normal.  No traumatic finding.  No focal bone lesion. Sinuses/Orbits: Sinuses are clear. Orbits appear normal. Mastoids are clear. Other: None significant CT MAXILLOFACIAL FINDINGS Osseous: No facial fracture. Orbits: No orbital injury. Sinuses: Clear Soft tissues: Soft tissue swelling of the nose. CT CERVICAL SPINE FINDINGS Alignment: No traumatic malalignment.  Straightening of the normal cervical lordosis, which could simply be positional. Skull base and vertebrae: No regional fracture. Soft tissues and spinal canal: Normal Disc levels: No degenerative disc disease. No facet arthropathy. No stenosis. Upper chest: Normal Other: None IMPRESSION: HEAD CT: Normal. MAXILLOFACIAL CT: Soft tissue swelling of the nose. No facial fracture. CERVICAL SPINE CT: No traumatic finding. Straightening of the normal cervical lordosis, which could simply be positional. Electronically Signed   By: Paulina FusiMark  Shogry M.D.   On: 12/29/2020 11:33    EKG EKG Interpretation  Date/Time:  Thursday December 29 2020 10:06:09 EDT Ventricular Rate:  74 PR Interval:    QRS Duration: 77 QT Interval:  379 QTC Calculation: 421 R Axis:   46 Text Interpretation: Sinus rhythm Borderline prolonged PR interval Confirmed by Cathren LaineSteinl, Schyler Butikofer ([id number]) on 12/29/2020 10:08:50 AM   Radiology CT HEAD WO CONTRAST  Result Date: 12/29/2020 CLINICAL DATA:  Larey SeatFell last night with trauma to the head, face and neck. EXAM: CT HEAD WITHOUT CONTRAST CT MAXILLOFACIAL WITHOUT CONTRAST CT CERVICAL SPINE WITHOUT CONTRAST TECHNIQUE: Multidetector CT imaging of the head, cervical spine, and maxillofacial structures were performed using the standard protocol without intravenous contrast. Multiplanar CT image reconstructions of the cervical spine and maxillofacial structures were also generated. COMPARISON:  None. FINDINGS: CT HEAD FINDINGS Brain: The brain shows a normal appearance without evidence of malformation, atrophy, old or acute small or large vessel infarction, mass lesion, hemorrhage, hydrocephalus or extra-axial collection. Vascular: No hyperdense vessel. No evidence of atherosclerotic calcification. Skull: Normal.  No traumatic finding.  No focal bone lesion. Sinuses/Orbits: Sinuses are clear. Orbits appear normal. Mastoids are clear. Other: None significant CT MAXILLOFACIAL FINDINGS Osseous: No facial fracture. Orbits:  No orbital injury. Sinuses: Clear Soft tissues: Soft tissue swelling of the nose. CT CERVICAL SPINE FINDINGS Alignment: No traumatic malalignment. Straightening of the normal cervical lordosis, which could simply be positional. Skull base and vertebrae: No regional fracture. Soft tissues and spinal canal: Normal Disc levels: No degenerative disc disease. No facet arthropathy. No stenosis. Upper chest: Normal Other: None IMPRESSION: HEAD CT: Normal. MAXILLOFACIAL CT: Soft tissue swelling of the nose.  No facial fracture. CERVICAL SPINE CT: No traumatic finding. Straightening of the normal cervical lordosis, which could simply be positional. Electronically Signed   By: Paulina Fusi M.D.   On: 12/29/2020 11:33   CT CERVICAL SPINE WO CONTRAST  Result Date: 12/29/2020 CLINICAL DATA:  Larey Seat last night with trauma to the head, face and neck. EXAM: CT HEAD WITHOUT CONTRAST CT MAXILLOFACIAL WITHOUT CONTRAST CT CERVICAL SPINE WITHOUT CONTRAST TECHNIQUE: Multidetector CT imaging of the head, cervical spine, and maxillofacial structures were performed using the standard protocol without intravenous contrast. Multiplanar CT image reconstructions of the cervical spine and maxillofacial structures were also generated. COMPARISON:  None. FINDINGS: CT HEAD FINDINGS Brain: The brain shows a normal appearance without evidence of malformation, atrophy, old or acute small or large vessel infarction, mass lesion, hemorrhage, hydrocephalus or extra-axial collection. Vascular: No hyperdense vessel. No evidence of atherosclerotic calcification. Skull: Normal.  No traumatic finding.  No focal bone lesion. Sinuses/Orbits: Sinuses are clear. Orbits appear normal. Mastoids are clear. Other: None significant CT MAXILLOFACIAL FINDINGS Osseous: No facial fracture. Orbits: No orbital injury. Sinuses: Clear Soft tissues: Soft tissue swelling of the nose. CT CERVICAL SPINE FINDINGS Alignment: No traumatic malalignment. Straightening of the normal  cervical lordosis, which could simply be positional. Skull base and vertebrae: No regional fracture. Soft tissues and spinal canal: Normal Disc levels: No degenerative disc disease. No facet arthropathy. No stenosis. Upper chest: Normal Other: None IMPRESSION: HEAD CT: Normal. MAXILLOFACIAL CT: Soft tissue swelling of the nose. No facial fracture. CERVICAL SPINE CT: No traumatic finding. Straightening of the normal cervical lordosis, which could simply be positional. Electronically Signed   By: Paulina Fusi M.D.   On: 12/29/2020 11:33   CT Maxillofacial WO CM  Result Date: 12/29/2020 CLINICAL DATA:  Larey Seat last night with trauma to the head, face and neck. EXAM: CT HEAD WITHOUT CONTRAST CT MAXILLOFACIAL WITHOUT CONTRAST CT CERVICAL SPINE WITHOUT CONTRAST TECHNIQUE: Multidetector CT imaging of the head, cervical spine, and maxillofacial structures were performed using the standard protocol without intravenous contrast. Multiplanar CT image reconstructions of the cervical spine and maxillofacial structures were also generated. COMPARISON:  None. FINDINGS: CT HEAD FINDINGS Brain: The brain shows a normal appearance without evidence of malformation, atrophy, old or acute small or large vessel infarction, mass lesion, hemorrhage, hydrocephalus or extra-axial collection. Vascular: No hyperdense vessel. No evidence of atherosclerotic calcification. Skull: Normal.  No traumatic finding.  No focal bone lesion. Sinuses/Orbits: Sinuses are clear. Orbits appear normal. Mastoids are clear. Other: None significant CT MAXILLOFACIAL FINDINGS Osseous: No facial fracture. Orbits: No orbital injury. Sinuses: Clear Soft tissues: Soft tissue swelling of the nose. CT CERVICAL SPINE FINDINGS Alignment: No traumatic malalignment. Straightening of the normal cervical lordosis, which could simply be positional. Skull base and vertebrae: No regional fracture. Soft tissues and spinal canal: Normal Disc levels: No degenerative disc disease.  No facet arthropathy. No stenosis. Upper chest: Normal Other: None IMPRESSION: HEAD CT: Normal. MAXILLOFACIAL CT: Soft tissue swelling of the nose. No facial fracture. CERVICAL SPINE CT: No traumatic finding. Straightening of the normal cervical lordosis, which could simply be positional. Electronically Signed   By: Paulina Fusi M.D.   On: 12/29/2020 11:33    Procedures Procedures   Medications Ordered in ED Medications - No data to display  ED Course  I have reviewed the triage vital signs and the nursing notes.  Pertinent labs & imaging results that were available during my care of the patient were reviewed by me and considered  in my medical decision making (see chart for details).    MDM Rules/Calculators/A&P                          Continuous pulse ox and cardiac monitoring. Ecg. Labs. Imaging ordered.   Reviewed nursing notes and prior charts for additional history.   Labs reviewed/interpreted by me hgb normal.   CT reviewed/interpreted by me - no hem.   Acetaminophen po. Po fluids. Ambulate in hall.  Pt appears stable for d/c.   Return precautions provided.      Final Clinical Impression(s) / ED Diagnoses Final diagnoses:  None    Rx / DC Orders ED Discharge Orders    None           Cathren Laine, MD 12/29/20 1225

## 2020-12-29 NOTE — Discharge Instructions (Signed)
It was our pleasure to provide your ER care today - we hope that you feel better.  Icepack to sore area/face.   Take your pain medication as need.   If you begin to feel faint, lie down immediately, so as to help decrease chance of fall and injury.   Follow up with primary care doctor in the next couple of weeks - also have blood pressure rechecked then as it is high today.   Return to ER if worse, new symptoms, fevers, new, worsening or severe pain, recurrent fainting, chest pain, racing heart beat, or other concern.

## 2020-12-29 NOTE — ED Notes (Signed)
ED Provider at bedside. 

## 2021-01-07 ENCOUNTER — Emergency Department (HOSPITAL_BASED_OUTPATIENT_CLINIC_OR_DEPARTMENT_OTHER)
Admission: EM | Admit: 2021-01-07 | Discharge: 2021-01-07 | Disposition: A | Discharge status: Home / Self Care | Payer: PRIVATE HEALTH INSURANCE | Attending: Emergency Medicine | Admitting: Emergency Medicine

## 2021-01-07 ENCOUNTER — Other Ambulatory Visit: Payer: Self-pay

## 2021-01-07 ENCOUNTER — Emergency Department (HOSPITAL_BASED_OUTPATIENT_CLINIC_OR_DEPARTMENT_OTHER): Payer: PRIVATE HEALTH INSURANCE | Admitting: Radiology

## 2021-01-07 ENCOUNTER — Emergency Department (HOSPITAL_BASED_OUTPATIENT_CLINIC_OR_DEPARTMENT_OTHER): Payer: PRIVATE HEALTH INSURANCE

## 2021-01-07 DIAGNOSIS — S060X1A Concussion with loss of consciousness of 30 minutes or less, initial encounter: Secondary | ICD-10-CM | POA: Insufficient documentation

## 2021-01-07 DIAGNOSIS — R002 Palpitations: Secondary | ICD-10-CM | POA: Diagnosis not present

## 2021-01-07 DIAGNOSIS — Z79899 Other long term (current) drug therapy: Secondary | ICD-10-CM | POA: Insufficient documentation

## 2021-01-07 DIAGNOSIS — Y9301 Activity, walking, marching and hiking: Secondary | ICD-10-CM | POA: Insufficient documentation

## 2021-01-07 DIAGNOSIS — R0789 Other chest pain: Secondary | ICD-10-CM | POA: Diagnosis not present

## 2021-01-07 DIAGNOSIS — S0990XA Unspecified injury of head, initial encounter: Secondary | ICD-10-CM | POA: Diagnosis present

## 2021-01-07 DIAGNOSIS — W19XXXA Unspecified fall, initial encounter: Secondary | ICD-10-CM | POA: Insufficient documentation

## 2021-01-07 DIAGNOSIS — I10 Essential (primary) hypertension: Secondary | ICD-10-CM | POA: Insufficient documentation

## 2021-01-07 DIAGNOSIS — S060X9A Concussion with loss of consciousness of unspecified duration, initial encounter: Secondary | ICD-10-CM

## 2021-01-07 LAB — CBC WITH DIFFERENTIAL/PLATELET
Abs Immature Granulocytes: 0 10*3/uL (ref 0.00–0.07)
Basophils Absolute: 0 10*3/uL (ref 0.0–0.1)
Basophils Relative: 1 %
Eosinophils Absolute: 0.1 10*3/uL (ref 0.0–0.5)
Eosinophils Relative: 2 %
HCT: 39.2 % (ref 36.0–46.0)
Hemoglobin: 12.7 g/dL (ref 12.0–15.0)
Immature Granulocytes: 0 %
Lymphocytes Relative: 40 %
Lymphs Abs: 2.3 10*3/uL (ref 0.7–4.0)
MCH: 28.7 pg (ref 26.0–34.0)
MCHC: 32.4 g/dL (ref 30.0–36.0)
MCV: 88.7 fL (ref 80.0–100.0)
Monocytes Absolute: 0.8 10*3/uL (ref 0.1–1.0)
Monocytes Relative: 14 %
Neutro Abs: 2.5 10*3/uL (ref 1.7–7.7)
Neutrophils Relative %: 43 %
Platelets: 338 10*3/uL (ref 150–400)
RBC: 4.42 MIL/uL (ref 3.87–5.11)
RDW: 12.9 % (ref 11.5–15.5)
WBC: 5.7 10*3/uL (ref 4.0–10.5)
nRBC: 0 % (ref 0.0–0.2)

## 2021-01-07 LAB — BASIC METABOLIC PANEL
Anion gap: 11 (ref 5–15)
BUN: 16 mg/dL (ref 8–23)
CO2: 25 mmol/L (ref 22–32)
Calcium: 9.2 mg/dL (ref 8.9–10.3)
Chloride: 103 mmol/L (ref 98–111)
Creatinine, Ser: 0.9 mg/dL (ref 0.44–1.00)
GFR, Estimated: >60 mL/min (ref >60)
Glucose, Bld: 111 mg/dL — ABNORMAL HIGH (ref 70–99)
Potassium: 3.7 mmol/L (ref 3.5–5.1)
Sodium: 139 mmol/L (ref 135–145)

## 2021-01-07 LAB — URINALYSIS, ROUTINE W REFLEX MICROSCOPIC
Bilirubin Urine: NEGATIVE
Glucose, UA: NEGATIVE mg/dL
Hgb urine dipstick: NEGATIVE
Ketones, ur: NEGATIVE mg/dL
Leukocytes,Ua: NEGATIVE
Nitrite: NEGATIVE
Protein, ur: NEGATIVE mg/dL
Specific Gravity, Urine: 1.017 (ref 1.005–1.030)
pH: 6 (ref 5.0–8.0)

## 2021-01-07 LAB — TROPONIN I (HIGH SENSITIVITY)
Troponin I (High Sensitivity): 6 ng/L (ref <18)
Troponin I (High Sensitivity): 7 ng/L (ref <18)

## 2021-01-07 MED ORDER — SODIUM CHLORIDE 0.9 % IV BOLUS
1000.0000 mL | Freq: Once | INTRAVENOUS | Status: AC
  Administered 2021-01-07: 1000 mL via INTRAVENOUS

## 2021-01-07 MED ORDER — ACETAMINOPHEN 500 MG PO TABS
1000.0000 mg | ORAL_TABLET | Freq: Once | ORAL | Status: AC
  Administered 2021-01-07: 1000 mg via ORAL
  Filled 2021-01-07: qty 2

## 2021-01-07 NOTE — ED Triage Notes (Signed)
Pt c/o high HR, up to 123, & high BP x4 days. Seen last week for fall; states dizzy, L sd headache, & visual disturbances since.

## 2021-01-07 NOTE — Discharge Instructions (Addendum)
Follow-up with your primary care doctor as discussed.  Refrain from driving until your symptoms have improved.  I have given you a referral to follow-up with the cardiologist regarding your recurrent syncopal episodes.  You will need to call make an appointment.  Return here as needed for any worsening symptoms.

## 2021-01-07 NOTE — ED Notes (Signed)
Advised by lab that trop is being sent out due to machine still being worked on.

## 2021-01-07 NOTE — ED Provider Notes (Signed)
[facility] EMERGENCY DEPT Provider Note   CSN: [id number] Arrival date & time: 01/07/21  1135     History Chief Complaint  Patient presents with  . Tachycardia  . Hypertension    Debbie Hatfield is a 65 y.o. female.  Patient is a 65 year old female with a history of lupus, hypertension, GERD, trigeminal neuralgia who presents with worsening headaches and dizziness after recent fall.  She was seen here on March 17 after she had a fall.  She has a history of vasovagal syncope and had an episode on March 17 where she was having some abdominal cramping and went to sit on the toilet and then passed out, waking up on the ground.  She had some facial trauma and had a CT scan of her head and facial bones as well as her cervical spine here that were negative for acute trauma.  She said about 48 hours after the event she started having left-sided headache which is progressively gotten worse since that time.  She has associated nausea.  She is also noticed some intermittent vision change in that left eye.  She says she feels like it is a little bit blurry on the outside but as she concentrates, it normalizes.  She does not have any double vision or blind spots.  She has had no vomiting.  She has some soreness all down the left side of her face and her left neck.  She also has felt like her blood pressure and her heart rate had been elevated for the last few days.  Her heart rate was up to 123 this morning.  She is felt like her heart is pounding harder than normally and at times she has some chest tightness which is actually what she is experiencing currently.  Its in the center of her chest.  Its nonradiating.  She denies any shortness of breath.  No diaphoresis.  She has had some difficulty with her concentration since the accident.  She is also had some feeling that she is a little off balance when she is walking.  She denies any numbness or weakness to her extremities.  She does report some  dizziness but but no vertiginous symptoms.  No difficulty with her speech.        Past Medical History:  Diagnosis Date  . Closed fracture of right distal radius   . Gastroparesis   . GERD (gastroesophageal reflux disease)   . Hypertension   . Irritable bowel disease   . Lupus (HCC)   . Major depression   . Trigeminal neuralgia     There are no problems to display for this patient.   Past Surgical History:  Procedure Laterality Date  . CHOLECYSTECTOMY    . COLONOSCOPY    . ORIF WRIST FRACTURE Right 09/22/2020   Procedure: OPEN REDUCTION INTERNAL FIXATION (ORIF) WRIST FRACTURE;  Surgeon: Ernest Mallick, MD;  Location: [facility];  Service: Orthopedics;  Laterality: Right;      OB History   No obstetric history on file.     No family history on file.  Social History   Tobacco Use  . Smoking status: Never Smoker  . Smokeless tobacco: Never Used  Substance Use Topics  . Alcohol use: Yes    Comment: social  . Drug use: Never    Home Medications Prior to Admission medications   Medication Sig Start Date End Date Taking? Authorizing Provider  acyclovir (ZOVIRAX) 800 MG tablet Take 1,000 mg  by mouth once.    [provider]  carbamazepine (CARBATROL) 300 MG 12 hr capsule Take 300 mg by mouth daily.     [provider]  esomeprazole (NEXIUM) 40 MG capsule Take 40 mg by mouth Nightly.    [provider]  gabapentin (NEURONTIN) 300 MG capsule Take 600 mg by mouth 3 (three) times daily.    [provider]  HYDROcodone-acetaminophen (NORCO/VICODIN) 5-325 MG tablet Take 1-2 tablets by mouth every 6 (six) hours as needed. 09/22/20   Cain Saupe III, MD  hydroxychloroquine (PLAQUENIL) 200 MG tablet Take 200 mg by mouth daily.    [provider]  lisinopril-hydrochlorothiazide (PRINZIDE,ZESTORETIC) 20-12.5 MG per tablet Take 1 tablet by mouth daily.    [provider]  traMADol (ULTRAM) 50  MG tablet Take 50 mg by mouth every 6 (six) hours as needed.    [provider]  venlafaxine XR (EFFEXOR-XR) 150 MG 24 hr capsule Take 75 mg by mouth daily.     [provider]    Allergies    Sulfa antibiotics and Penicillins  Review of Systems   Review of Systems  Constitutional: Positive for fatigue. Negative for chills, diaphoresis and fever.  HENT: Negative for congestion, rhinorrhea and sneezing.   Eyes: Negative.   Respiratory: Positive for chest tightness. Negative for cough and shortness of breath.   Cardiovascular: Positive for palpitations. Negative for chest pain and leg swelling.  Gastrointestinal: Positive for nausea. Negative for abdominal pain, blood in stool, diarrhea and vomiting.  Genitourinary: Negative for difficulty urinating, flank pain, frequency and hematuria.  Musculoskeletal: Positive for myalgias. Negative for arthralgias and back pain.  Skin: Negative for rash.  Neurological: Positive for dizziness and headaches. Negative for speech difficulty, weakness and numbness.  Psychiatric/Behavioral: Positive for decreased concentration.    Physical Exam Updated Vital Signs BP (!) 158/72   Pulse 77   Temp 98 F (36.7 C)   Resp 20   Ht 5\' 2"  (1.575 m)   Wt 68 kg   SpO2 100%   BMI 27.44 kg/m   Physical Exam Constitutional:      Appearance: She is well-developed.  HENT:     Head: Normocephalic.     Comments: Minor ecchymosis around her eyes, left TM is clear.  There is some tenderness on palpation of the left frontal temporal bone.  No step-offs or deformities are noted.  There is some minor soreness over her left maxilla but no deformity noted. Eyes:     Pupils: Pupils are equal, round, and reactive to light.  Neck:     Comments: No meningismus or palpable tenderness along the spine, no carotid bruit Cardiovascular:     Rate and Rhythm: Normal rate and regular rhythm.     Heart sounds: Normal heart sounds.  Pulmonary:     Effort:  Pulmonary effort is normal. No respiratory distress.     Breath sounds: Normal breath sounds. No wheezing or rales.  Chest:     Chest wall: No tenderness.  Abdominal:     General: Bowel sounds are normal.     Palpations: Abdomen is soft.     Tenderness: There is no abdominal tenderness. There is no guarding or rebound.  Musculoskeletal:        General: Normal range of motion.     Cervical back: Normal range of motion and neck supple.  Lymphadenopathy:     Cervical: No cervical adenopathy.  Skin:    General: Skin is warm and  dry.     Findings: No rash.  Neurological:     Mental Status: She is alert and oriented to person, place, and time.     Comments: Motor 5/5 all extremities Sensation grossly intact to LT all extremities Finger to Nose intact, no pronator drift CN II-XII grossly intact Visual fields full to confrontation      ED Results / Procedures / Treatments   Labs (all labs ordered are listed, but only abnormal results are displayed) Labs Reviewed  BASIC METABOLIC PANEL - Abnormal; Notable for the following components:      Result Value   Glucose, Bld 111 (*)    All other components within normal limits  CBC WITH DIFFERENTIAL/PLATELET  URINALYSIS, ROUTINE W REFLEX MICROSCOPIC  TROPONIN I (HIGH SENSITIVITY)  TROPONIN I (HIGH SENSITIVITY)    EKG None  Radiology DG Chest 2 View  Result Date: 01/07/2021 CLINICAL DATA:  Chest pain, hypertension EXAM: CHEST - 2 VIEW COMPARISON:  None. FINDINGS: The heart size and mediastinal contours are within normal limits. Atherosclerotic calcification of the aortic knob. 2-3 mm probable granuloma at the right upper lobe. Minimal right basilar atelectasis. Lungs are otherwise clear. No pleural effusion or pneumothorax. The visualized skeletal structures are unremarkable. IMPRESSION: Minimal right basilar atelectasis. Otherwise, no acute cardiopulmonary findings. Electronically Signed   By: Duanne GuessNicholas  Plundo D.O.   On: 01/07/2021  13:02   CT Head Wo Contrast  Result Date: 01/07/2021 CLINICAL DATA:  Hypertension EXAM: CT HEAD WITHOUT CONTRAST TECHNIQUE: Contiguous axial images were obtained from the base of the skull through the vertex without intravenous contrast. COMPARISON:  12/29/2020 FINDINGS: Brain: No evidence of acute infarction, hemorrhage, hydrocephalus, extra-axial collection or mass lesion/mass effect. Vascular: Atherosclerotic calcifications involving the large vessels of the skull base. No unexpected hyperdense vessel. Skull: Normal. Negative for fracture or focal lesion. Sinuses/Orbits: No acute finding. Other: None. IMPRESSION: No acute intracranial findings. Electronically Signed   By: Duanne GuessNicholas  Plundo D.O.   On: 01/07/2021 13:04    Procedures Procedures   Medications Ordered in ED Medications  acetaminophen (TYLENOL) tablet 1,000 mg (1,000 mg Oral Given 01/07/21 1224)  sodium chloride 0.9 % bolus 1,000 mL (0 mLs Intravenous Stopped 01/07/21 1410)    ED Course  I have reviewed the triage vital signs and the nursing notes.  Pertinent labs & imaging results that were available during my care of the patient were reviewed by me and considered in my medical decision making (see chart for details).    MDM Rules/Calculators/A&P                          Patient is a 65 year old female who presents with what seems like some concussion symptoms.  She has had some worsening headache associated with some nausea dizziness and some intermittent vision changes.  She does not have any focal neurologic changes.  She had a repeat head CT which shows no acute abnormality.  No delayed bleeding.  She previously had a CT scan of her head facial bones and cervical spine on her prior ED visit.  Her labs are nonconcerning.  She was having some chest discomfort which is unclear whether it was related to palpitations or not.  She does not have any arrhythmias or ischemic changes on EKG.  She is had 2 - troponins.  Her chest x-ray  is clear other than some mild atelectasis.  She was given IV fluids.  Her heart rate is consistently in the 70s  and 80s without identified arrhythmias.  She is able to ambulate without ataxia or significant dizziness.  She says she is feeling a bit better after the IV fluids and some Tylenol for her headache.  She was discharged home in good condition.  I encouraged her to follow-up with her PCP regarding her concussion symptoms.  She does report that she has had these recurrent vasovagal type episodes and she is now having them every 3 to 4 months.  She does not report that she has had any significant evaluation for these and request a referral to cardiology which I feel is appropriate.  Strict return precautions were given. Final Clinical Impression(s) / ED Diagnoses Final diagnoses:  Concussion with loss of consciousness, initial encounter  Palpitations    Rx / DC Orders ED Discharge Orders    None       Rolan Bucco, MD 01/07/21 1507

## 2021-01-07 NOTE — ED Notes (Signed)
EKG exported and given to Rolan Bucco MD.

## 2021-01-07 NOTE — ED Notes (Signed)
She has ambulated to and from b.r. capably without assistance. She ha no requests at this time.

## 2021-01-09 DIAGNOSIS — G500 Trigeminal neuralgia: Secondary | ICD-10-CM | POA: Insufficient documentation

## 2021-01-09 DIAGNOSIS — K589 Irritable bowel syndrome without diarrhea: Secondary | ICD-10-CM | POA: Insufficient documentation

## 2021-01-09 DIAGNOSIS — F329 Major depressive disorder, single episode, unspecified: Secondary | ICD-10-CM | POA: Insufficient documentation

## 2021-01-09 DIAGNOSIS — S52501A Unspecified fracture of the lower end of right radius, initial encounter for closed fracture: Secondary | ICD-10-CM | POA: Insufficient documentation

## 2021-01-09 DIAGNOSIS — M329 Systemic lupus erythematosus, unspecified: Secondary | ICD-10-CM | POA: Insufficient documentation

## 2021-01-09 DIAGNOSIS — K3184 Gastroparesis: Secondary | ICD-10-CM | POA: Insufficient documentation

## 2021-01-09 DIAGNOSIS — I10 Essential (primary) hypertension: Secondary | ICD-10-CM | POA: Insufficient documentation

## 2021-01-09 DIAGNOSIS — IMO0002 Reserved for concepts with insufficient information to code with codable children: Secondary | ICD-10-CM | POA: Insufficient documentation

## 2021-01-09 DIAGNOSIS — K219 Gastro-esophageal reflux disease without esophagitis: Secondary | ICD-10-CM | POA: Insufficient documentation

## 2021-01-11 NOTE — Progress Notes (Signed)
Cardiology Office Note:    Date:  01/12/2021   ID:  Debbie Hatfield, DOB [birth year], MRN [id number]  PCP:  [facility]  Cardiologist:  Norman Herrlich, MD   Referring MD: [facility]  ASSESSMENT:    1. Syncope and collapse   2. Hypertension, unspecified type   3. Systolic murmur    PLAN:    In order of problems listed above:  1. Unusual presentation aspect suggest seizure disorder with bowel incontinence and some bleeding from the mouth symptoms that could be postictal afterwards.  Unfortunately this was unwitnessed.  As part of evaluation for syncope we will check an EEG for 30-day live monitor on it these were not remarkable we should consider an implantable loop recorder. 2. Poorly controlled avoid beta-blockers avoid direct peripheral dilators and additionally I will switch her to a more potent ARB thiazide combination trend blood pressures with a second agent as needed consider MRA. 3. Check echocardiogram differential diagnosis includes aortic valve sclerosis bicuspid aortic valve or even obstructive hypertrophic cardiomyopathy  Next appointment 6 weeks   Medication Adjustments/Labs and Tests Ordered: Current medicines are reviewed at length with the patient today.  Concerns regarding medicines are outlined above.  Orders Placed This Encounter  Procedures  . LONG TERM MONITOR-LIVE TELEMETRY (3-14 DAYS)  . EKG 12-Lead  . ECHOCARDIOGRAM COMPLETE  . EEG adult   Meds ordered this encounter  Medications  . telmisartan-hydrochlorothiazide (MICARDIS HCT) 40-12.5 MG tablet    Sig: Take 1 tablet by mouth daily.    Dispense:  90 tablet    Refill:  3     Chief Complaint  Patient presents with  . Follow-up    History of syncope recent episode in ED visit    History of Present Illness:    Debbie Hatfield is a 65 y.o. female with a history of hypertension who is being seen today for the evaluation of syncope at the request of  [facility]  She was seen in the ED after an episode which occurred during defecation fell and struck her face and head.  ED evaluation showed normal CBC BMP EKG was sinus rhythm borderline first-degree AV block otherwise normal CT of the head was performed markable was discharged from the hospital.  She was seen again in the ED 01/07/2021 for headaches and dizziness following her recent syncopal episode and fall.  She is felt to have symptoms from concussion.  She is agreeable and reevaluated EKG with stable no arrhythmia and troponin was normal.  She was monitored in the ED with heart rates 70 to 90 bpm without arrhythmia  She is a Facilities manager in outpatient care. She has a history for several years of episodes of near syncope never severe enough to lose consciousness did not happen frequently but usually brief. Recently she got up in the middle the night to go the bathroom and she loses track of events and was alone at home and found herself on the floor.  She had fallen off the toilet and she had defecated she had struck her nose was bleeding but was also bleeding from her mouth.  She managed to clean up to get back to bed and went back to sleep and has no idea how long she was on before no recall of events but knows that she was not right and how she felt to have a cough process when she regained consciousness.  She has no history of previous head trauma  or seizure and was subsequently seen in the ED was felt to be postconcussive symptoms.  Her blood pressures been difficult to control and recently she has had systolics greater than 190 diastolics greater than 100.  She has been taking ACE inhibitor diuretic in apparent basis.  She has no known history of heart disease congenital rheumatic, she was told she had a murmur in the past no palpitation.  Family history of individuals with slow heart rates and pacemakers.  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  Her mother was present and participated in the evaluation and decision making Past Medical History:  Diagnosis Date  . Closed fracture of right distal radius   . Gastroparesis   . GERD (gastroesophageal reflux disease)   . Hypertension   . Irritable bowel disease   . Lupus (HCC)   . Major depression   . Trigeminal neuralgia     Past Surgical History:  Procedure Laterality Date  . CHOLECYSTECTOMY    . COLONOSCOPY    . ORIF WRIST FRACTURE Right 09/22/2020   Procedure: OPEN REDUCTION INTERNAL FIXATION (ORIF) WRIST FRACTURE;  Surgeon: Ernest Mallick, MD;  Location: [facility];  Service: Orthopedics;  Laterality: Right;     Current Medications: Current Meds  Medication Sig  . acyclovir (ZOVIRAX) 800 MG tablet Take 1,000 mg by mouth once.  . carbamazepine (CARBATROL) 300 MG 12 hr capsule Take 300 mg by mouth daily.   Marland Kitchen esomeprazole (NEXIUM) 40 MG capsule Take 40 mg by mouth Nightly.  . gabapentin (NEURONTIN) 300 MG capsule Take 600 mg by mouth 3 (three) times daily.  . hydroxychloroquine (PLAQUENIL) 200 MG tablet Take 200 mg by mouth daily.  Marland Kitchen telmisartan-hydrochlorothiazide (MICARDIS HCT) 40-12.5 MG tablet Take 1 tablet by mouth daily.  . tizanidine (ZANAFLEX) 2 MG capsule Take 1 capsule by mouth at bedtime.  . traMADol (ULTRAM) 50 MG tablet Take 50 mg by mouth every 6 (six) hours as needed.  . venlafaxine XR (EFFEXOR-XR) 150 MG 24 hr capsule Take 150 mg by mouth daily.  . [DISCONTINUED] lisinopril-hydrochlorothiazide (PRINZIDE,ZESTORETIC) 20-12.5 MG per tablet Take 1 tablet by mouth daily.     Allergies:   Sulfa antibiotics and Penicillins   Social History   Socioeconomic History  . Marital status: Divorced    Spouse name: Not on file  . Number of children: Not on file  . Years of education: Not on file  . Highest education level: Not on file  Occupational History  . Not on file  Tobacco Use  . Smoking  status: Never Smoker  . Smokeless tobacco: Never Used  Substance and Sexual Activity  . Alcohol use: Yes    Comment: social  . Drug use: Never  . Sexual activity: Not Currently    Birth control/protection: Post-menopausal  Other Topics Concern  . Not on file  Social History Narrative  . Not on file   Social Determinants of Health   Financial Resource Strain: Not on file  Food Insecurity: Not on file  Transportation Needs: Not on file  Physical Activity: Not on file  Stress: Not on file  Social Connections: Not on file     Family History: The patient's family history includes Alcoholism in her father; Colon cancer in her maternal grandfather; Congestive Heart Failure in her maternal grandfather and maternal grandmother; Depression in her mother; Diabetes in her maternal grandmother; Gout in her mother; Heart block  in her mother; Hypertension in her mother and paternal grandfather; Macular degeneration in her mother; Stroke in her paternal grandfather.  ROS:   ROS Please see the history of present illness.     All other systems reviewed and are negative.  EKGs/Labs/Other Studies Reviewed:    The following studies were reviewed today:   EKG:  EKG is  ordered today.  The ekg ordered today is personally reviewed and demonstrates sinus rhythm and is normal  Recent Labs: 01/07/2021: BUN 16; Creatinine, Ser 0.90; Hemoglobin 12.7; Platelets 338; Potassium 3.7; Sodium 139    Physical Exam:    VS:  BP (!) 180/80   Pulse 68   Ht 5\' 2"  (1.575 m)   Wt 149 lb 1.3 oz (67.6 kg)   SpO2 99%   BMI 27.27 kg/m     Wt Readings from Last 3 Encounters:  01/12/21 149 lb 1.3 oz (67.6 kg)  01/07/21 150 lb (68 kg)  12/29/20 150 lb (68 kg)     GEN:  Well nourished, well developed in no acute distress HEENT: Normal NECK: No JVD; No carotid bruits LYMPHATICS: No lymphadenopathy CARDIAC: 1-2 of 6 localized left sternal border to the aortic area systolic ejection murmur does not radiate to  the carotids no aortic regurgitation RRR, no  rubs, gallops RESPIRATORY:  Clear to auscultation without rales, wheezing or rhonchi  ABDOMEN: Soft, non-tender, non-distended MUSCULOSKELETAL:  No edema; No deformity  SKIN: Warm and dry NEUROLOGIC:  Alert and oriented x 3 PSYCHIATRIC:  Normal affect     Signed, 12/31/20, MD  01/12/2021 10:36 AM    [facility]

## 2021-01-12 ENCOUNTER — Other Ambulatory Visit: Payer: Self-pay

## 2021-01-12 ENCOUNTER — Ambulatory Visit (INDEPENDENT_AMBULATORY_CARE_PROVIDER_SITE_OTHER): Payer: No Typology Code available for payment source | Admitting: Cardiology

## 2021-01-12 ENCOUNTER — Ambulatory Visit (INDEPENDENT_AMBULATORY_CARE_PROVIDER_SITE_OTHER): Payer: No Typology Code available for payment source

## 2021-01-12 ENCOUNTER — Encounter: Payer: Self-pay | Admitting: Cardiology

## 2021-01-12 VITALS — BP 180/80 | HR 68 | Ht 62.0 in | Wt 149.1 lb

## 2021-01-12 DIAGNOSIS — R011 Cardiac murmur, unspecified: Secondary | ICD-10-CM | POA: Diagnosis not present

## 2021-01-12 DIAGNOSIS — I10 Essential (primary) hypertension: Secondary | ICD-10-CM | POA: Diagnosis not present

## 2021-01-12 DIAGNOSIS — R55 Syncope and collapse: Secondary | ICD-10-CM | POA: Diagnosis not present

## 2021-01-12 MED ORDER — TELMISARTAN-HCTZ 40-12.5 MG PO TABS: 1.0000 | ORAL_TABLET | Freq: Every day | ORAL | 3 refills | Status: DC

## 2021-01-12 NOTE — Patient Instructions (Signed)
Medication Instructions:  Your physician has recommended you make the following change in your medication: STOP: Lisinopril/HCTZ START: Telmisartan/HCTZ 40/12.5 mg take one tablet by mouth daily.  *If you need a refill on your cardiac medications before your next appointment, please call your pharmacy*   Lab Work: None If you have labs (blood work) drawn today and your tests are completely normal, you will receive your results only by: Marland Kitchen MyChart Message (if you have MyChart) OR . A paper copy in the mail If you have any lab test that is abnormal or we need to change your treatment, we will call you to review the results.   Testing/Procedures: Your physician has requested that you have an echocardiogram. Echocardiography is a painless test that uses sound waves to create images of your heart. It provides your doctor with information about the size and shape of your heart and how well your heart's chambers and valves are working. This procedure takes approximately one hour. There are no restrictions for this procedure.  A zio monitor was ordered today. It will remain on for 14 days. You will then return monitor and event diary in provided box. It takes 1-2 weeks for report to be downloaded and returned to [place]. We will call you with the results. If monitor falls off or has orange flashing light, please call Zio for further instructions.    EEG:  Bring a list of your medications.  Sleep deprived studies: You must be without sleep 6 hours  prior to test.   No napping prior to test.  Shampoo your hair prior to study. Hair should be dry.  Please do not use any hair products including hair spray.  Hold Ritalin or any other medications of this type the day of  your study.  Eat a well-balanced caffeine-free meal, which excludes soft  drinks, tea, coffee and chocolate.   Follow-Up: At [facility], you and your health needs are our priority.  As part of our continuing mission to  provide you with exceptional heart care, we have created designated Provider Care Teams.  These Care Teams include your primary Cardiologist (physician) and Advanced Practice Providers (APPs -  Physician Assistants and Nurse Practitioners) who all work together to provide you with the care you need, when you need it.  We recommend signing up for the patient portal called "MyChart".  Sign up information is provided on this After Visit Summary.  MyChart is used to connect with patients for Virtual Visits (Telemedicine).  Patients are able to view lab/test results, encounter notes, upcoming appointments, etc.  Non-urgent messages can be sent to your provider as well.   To learn more about what you can do with MyChart, go to [email or web address].    Your next appointment:   6 week(s)  The format for your next appointment:   In Person  Provider:   Norman Herrlich, MD   Other Instructions

## 2021-01-17 ENCOUNTER — Other Ambulatory Visit: Payer: Self-pay

## 2021-01-17 ENCOUNTER — Ambulatory Visit (HOSPITAL_COMMUNITY)
Admission: RE | Admit: 2021-01-17 | Discharge: 2021-01-17 | Disposition: A | Discharge status: Home / Self Care | Payer: PRIVATE HEALTH INSURANCE | Source: Ambulatory Visit | Attending: Cardiology | Admitting: Cardiology

## 2021-01-17 ENCOUNTER — Telehealth: Payer: Self-pay

## 2021-01-17 DIAGNOSIS — R55 Syncope and collapse: Secondary | ICD-10-CM | POA: Diagnosis present

## 2021-01-17 NOTE — Telephone Encounter (Signed)
-----   Message from Baldo Daub, MD sent at 01/17/2021  4:12 PM EDT ----- Good result EEG normal

## 2021-01-17 NOTE — Progress Notes (Signed)
EEG complete. Results pending.  ?

## 2021-01-17 NOTE — Procedures (Signed)
Patient Name: Debbie Hatfield  MRN: [id number]  Epilepsy Attending: Charlsie Quest  Referring Physician/Provider: Dr Norman Herrlich Date: 01/17/2021 Duration: 32.29 mins  Patient history: 65yo F with syncope. EEG to evaluate for seizure  Level of alertness: Awake  AEDs during EEG study: None  Technical aspects: This EEG study was done with scalp electrodes positioned according to the 10-20 International system of electrode placement. Electrical activity was acquired at a sampling rate of 500Hz  and reviewed with a high frequency filter of 70Hz  and a low frequency filter of 1Hz . EEG data were recorded continuously and digitally stored.   Description: The posterior dominant rhythm consists of 9 Hz activity of moderate voltage (25-35 uV) seen predominantly in posterior head regions, symmetric and reactive to eye opening and eye closing. Hyperventilation did not show any EEG change.  Physiologic photic driving was seen during photic stimulation.       IMPRESSION: This study is within normal limits. No seizures or epileptiform discharges were seen throughout the recording.  [name] 

## 2021-01-17 NOTE — Telephone Encounter (Signed)
Left message on patients voicemail to please return our call.   

## 2021-01-18 ENCOUNTER — Telehealth: Payer: Self-pay

## 2021-01-18 LAB — HM PAP SMEAR

## 2021-01-18 NOTE — Telephone Encounter (Signed)
Spoke with patient regarding results and recommendation.  Patient verbalizes understanding and is agreeable to plan of care. Advised patient to call back with any issues or concerns.  

## 2021-01-18 NOTE — Telephone Encounter (Signed)
-----   Message from Brian J Munley, MD sent at 01/17/2021  4:12 PM EDT ----- Good result EEG normal 

## 2021-01-20 MED ORDER — CLONIDINE HCL 0.1 MG PO TABS: 0.1000 mg | ORAL_TABLET | Freq: Two times a day (BID) | ORAL | 3 refills | Status: DC | PRN

## 2021-01-26 DIAGNOSIS — R55 Syncope and collapse: Secondary | ICD-10-CM

## 2021-01-27 ENCOUNTER — Ambulatory Visit (HOSPITAL_COMMUNITY)
Admission: RE | Admit: 2021-01-27 | Discharge: 2021-01-27 | Disposition: A | Discharge status: Home / Self Care | Payer: No Typology Code available for payment source | Source: Ambulatory Visit | Attending: Cardiology | Admitting: Cardiology

## 2021-01-27 ENCOUNTER — Other Ambulatory Visit: Payer: Self-pay

## 2021-01-27 DIAGNOSIS — R55 Syncope and collapse: Secondary | ICD-10-CM | POA: Insufficient documentation

## 2021-01-27 NOTE — Progress Notes (Signed)
  Echocardiogram 2D Echocardiogram has been performed.  Debbie Hatfield 01/27/2021, 8:59 AM

## 2021-01-31 ENCOUNTER — Telehealth: Payer: Self-pay

## 2021-01-31 LAB — ECHOCARDIOGRAM COMPLETE
Area-P 1/2: 4.21 cm2
S' Lateral: 2.3 cm

## 2021-01-31 NOTE — Telephone Encounter (Signed)
-----   Message from Baldo Daub, MD sent at 01/31/2021  4:15 PM EDT ----- There is a good results normal echocardiogram no evidence of underlying heart disease

## 2021-01-31 NOTE — Telephone Encounter (Signed)
Spoke with patient regarding results and recommendation.  Patient verbalizes understanding and is agreeable to plan of care. Advised patient to call back with any issues or concerns.  

## 2021-02-20 ENCOUNTER — Telehealth: Payer: Self-pay

## 2021-02-20 NOTE — Telephone Encounter (Signed)
Left message on patients voicemail to please return our call.   

## 2021-02-20 NOTE — Telephone Encounter (Signed)
-----   Message from Baldo Daub, MD sent at 02/18/2021  1:24 PM EDT ----- Overall her monitor is good, she has occasional ventricular and atrial premature beats and she is aware of them.  I think that her episode was so profound that she should consider an implanted loop recorder.  If she is interested please schedule her with Dr. Elberta Fortis.

## 2021-02-20 NOTE — Telephone Encounter (Signed)
Debbie Hatfield is returning a call regarding results from this morning. Please advise

## 2021-02-21 ENCOUNTER — Telehealth: Payer: Self-pay

## 2021-02-21 DIAGNOSIS — I491 Atrial premature depolarization: Secondary | ICD-10-CM

## 2021-02-21 NOTE — Telephone Encounter (Signed)
-----   Message from Brian J Munley, MD sent at 02/18/2021  1:24 PM EDT ----- Overall her monitor is good, she has occasional ventricular and atrial premature beats and she is aware of them.  I think that her episode was so profound that she should consider an implanted loop recorder.  If she is interested please schedule her with Dr. Camnitz. 

## 2021-02-21 NOTE — Telephone Encounter (Signed)
Spoke with patient regarding results and recommendation.  Patient verbalizes understanding and is agreeable to plan of care. Advised patient to call back with any issues or concerns.  

## 2021-02-23 ENCOUNTER — Ambulatory Visit (INDEPENDENT_AMBULATORY_CARE_PROVIDER_SITE_OTHER): Payer: No Typology Code available for payment source | Admitting: Cardiology

## 2021-02-23 ENCOUNTER — Encounter: Payer: Self-pay | Admitting: Cardiology

## 2021-02-23 ENCOUNTER — Other Ambulatory Visit: Payer: Self-pay

## 2021-02-23 VITALS — BP 142/70 | HR 66 | Ht 62.0 in | Wt 152.0 lb

## 2021-02-23 DIAGNOSIS — R55 Syncope and collapse: Secondary | ICD-10-CM | POA: Diagnosis not present

## 2021-02-23 NOTE — Patient Instructions (Signed)
Medication Instructions:  Your physician recommends that you continue on your current medications as directed. Please refer to the Current Medication list given to you today.  *If you need a refill on your cardiac medications before your next appointment, please call your pharmacy*   Lab Work: None ordered   Testing/Procedures: None ordered   Follow-Up: At [facility], you and your health needs are our priority.  As part of our continuing mission to provide you with exceptional heart care, we have created designated Provider Care Teams.  These Care Teams include your primary Cardiologist (physician) and Advanced Practice Providers (APPs -  Physician Assistants and Nurse Practitioners) who all work together to provide you with the care you need, when you need it.   Your next appointment:    to be determined  The format for your next appointment:   In Person  Provider:   Loman Brooklyn, MD  We will call you to schedule loop recorder implant when we have received approval from your insurance.   Thank you for choosing [facility]!!   Dory Horn, RN [phone]   Other Instructions    Implantable Loop Recorder Placement  An implantable loop recorder is a small electronic device that is placed under the skin of your chest. The device records the electrical activity of your heart over a long period of time. Your health care provider can download these recordings to monitor your heart. You may need an implantable loop recorder if you have periods of abnormal heart activity (arrhythmias) or unexplained fainting (syncope). The recorder can be left in place for 1 year or longer. Tell a health care provider about:  Any allergies you have.  All medicines you are taking, including vitamins, herbs, eye drops, creams, and over-the-counter medicines.  Any problems you or family members have had with anesthetic medicines.  Any blood disorders you have.  Any surgeries you  have had.  Any medical conditions you have.  Whether you are pregnant or may be pregnant. What are the risks? Generally, this is a safe procedure. However, problems may occur, including:  Infection.  Bleeding.  Allergic reactions to anesthetic medicines.  Damage to nerves or blood vessels.  Failure of the device to work. This could require another surgery to replace it. What happens before the procedure?  You may have a physical exam, blood tests, and imaging tests of your heart, such as a chest X-ray.  Follow instructions from your health care provider about eating or drinking restrictions.  Ask your health care provider about: ? Changing or stopping your regular medicines. This is especially important if you are taking diabetes medicines or blood thinners. ? Taking medicines such as aspirin and ibuprofen. These medicines can thin your blood. Do not take these medicines unless your health care provider tells you to take them. ? Taking over-the-counter medicines, vitamins, herbs, and supplements.  Ask your health care provider how your surgical site will be marked or identified.  Ask your health care provider what steps will be taken to help prevent infection. These may include: ? Removing hair at the surgery site. ? Washing skin with a germ-killing soap.  Plan to have someone take you home from the hospital or clinic.  Plan to have a responsible adult care for you for at least 24 hours after you leave the hospital or clinic. This is important.  Do not use any products that contain nicotine or tobacco, such as cigarettes and e-cigarettes. If you need help quitting, ask  your health care provider.   What happens during the procedure?  An IV will be inserted into one of your veins.  You may be given one or more of the following: ? A medicine to help you relax (sedative). ? A medicine to numb the area (local anesthetic).  A small incision will be made on the left side of  your upper chest.  A pocket will be created under your skin.  The device will be placed in the pocket.  The incision will be closed with stitches (sutures) or adhesive strips.  A bandage (dressing) will be placed over the incision. The procedure may vary among health care providers and hospitals. What happens after the procedure?  Your blood pressure, heart rate, breathing rate, and blood oxygen level will be monitored until you leave the hospital or clinic.  You may be able to go home on the day of your surgery. Before you go home: ? Your health care provider will program your recorder. ? You will learn how to trigger your device with a handheld activator. ? You will learn how to send recordings to your health care provider. ? You will get an ID card for your device, and you will be told when to use it.  Do not drive for 24 hours if you were given a sedative during your procedure. Summary  An implantable loop recorder is a small electronic device that is placed under the skin of your chest to monitor your heart over a long period of time.  The recorder can be left in place for 1 year or longer.  Plan to have someone take you home from the hospital or clinic. This information is not intended to replace advice given to you by your health care provider. Make sure you discuss any questions you have with your health care provider. Document Revised: 06/21/2020 Document Reviewed: 11/16/2017 Elsevier Patient Education  2021 [employer].

## 2021-02-23 NOTE — Progress Notes (Signed)
Electrophysiology Office Note   Date:  02/23/2021   ID:  Debbie Hatfield, DOB 28-Apr-1956, MRN [id number]  PCP:  [facility]  Cardiologist:  [facility] Primary Electrophysiologist:  Dorlene Footman Jorja Loa, MD    Chief Complaint: Syncope   History of Present Illness: Debbie Hatfield is a 65 y.o. female who is being seen today for the evaluation of Syncope at the request of Dulce Sellar, Iline Oven, MD. Presenting today for electrophysiology evaluation.  She has a history of syncope.  She was seen in emergency room after an episode of syncope.  She struck her head and face.  She was using the restroom at the time.  Evaluation in the emergency room showed no major abnormalities.  She was then again in the emergency room 01/07/2021 for headaches and dizziness following a recurrent syncopal episode.  She was felt to have symptoms consistent with a concussion.  Again no arrhythmia was found and work-up was negative.  She has a several year history of near syncope.  She has never had syncope prior to these most recent events.  She recently got up to go to the bathroom in the middle of the night.  She was alone at home and found herself on the floor.  She had fallen off the toilet, and had a bowel movement, both her mouth and nose were bleeding at the time.  She got back in bed and went to sleep.  Today, she denies symptoms of palpitations, chest pain, shortness of breath, orthopnea, PND, lower extremity edema, claudication, dizziness, presyncope, syncope, bleeding, or neurologic sequela. The patient is tolerating medications without difficulties.  Since his episodes, she has had no further episodes of syncope.  She is felt well.  While wearing the monitor, felt well and did not have syncope or near syncope.   Past Medical History:  Diagnosis Date  . Closed fracture of right distal radius   . Gastroparesis   . GERD (gastroesophageal reflux disease)   . Hypertension   . Irritable  bowel disease   . Lupus (HCC)   . Major depression   . Trigeminal neuralgia    Past Surgical History:  Procedure Laterality Date  . CHOLECYSTECTOMY    . COLONOSCOPY    . ORIF WRIST FRACTURE Right 09/22/2020   Procedure: OPEN REDUCTION INTERNAL FIXATION (ORIF) WRIST FRACTURE;  Surgeon: Ernest Mallick, MD;  Location: [facility];  Service: Orthopedics;  Laterality: Right;      Current Outpatient Medications  Medication Sig Dispense Refill  . acyclovir (ZOVIRAX) 800 MG tablet Take 1,000 mg by mouth once.    . carbamazepine (CARBATROL) 300 MG 12 hr capsule Take 300 mg by mouth daily.     . cloNIDine (CATAPRES) 0.1 MG tablet Take 1 tablet (0.1 mg total) by mouth 2 (two) times daily as needed. 180 tablet 3  . esomeprazole (NEXIUM) 40 MG capsule Take 40 mg by mouth Nightly.    . gabapentin (NEURONTIN) 300 MG capsule Take 600 mg by mouth 3 (three) times daily.    . hydroxychloroquine (PLAQUENIL) 200 MG tablet Take 200 mg by mouth daily.    Marland Kitchen telmisartan-hydrochlorothiazide (MICARDIS HCT) 40-12.5 MG tablet Take 1 tablet by mouth daily. 90 tablet 3  . tizanidine (ZANAFLEX) 2 MG capsule Take 1 capsule by mouth at bedtime.    . traMADol (ULTRAM) 50 MG tablet Take 50 mg by mouth every 6 (six) hours as needed.    . venlafaxine XR (EFFEXOR-XR)  150 MG 24 hr capsule Take 150 mg by mouth daily.     No current facility-administered medications for this visit.    Allergies:   Sulfa antibiotics and Penicillins   Social History:  The patient  reports that she has never smoked. She has never used smokeless tobacco. She reports current alcohol use. She reports that she does not use drugs.   Family History:  The patient's family history includes Alcoholism in her father; Colon cancer in her maternal grandfather; Congestive Heart Failure in her maternal grandfather and maternal grandmother; Depression in her mother; Diabetes in her maternal grandmother; Gout in her mother; Heart  block in her mother; Hypertension in her mother and paternal grandfather; Macular degeneration in her mother; Stroke in her paternal grandfather.    ROS:  Please see the history of present illness.   Otherwise, review of systems is positive for none.   All other systems are reviewed and negative.    PHYSICAL EXAM: VS:  BP (!) 142/70   Pulse 66   Ht 5\' 2"  (1.575 m)   Wt 152 lb (68.9 kg)   SpO2 99%   BMI 27.80 kg/m  , BMI Body mass index is 27.8 kg/m. GEN: Well nourished, well developed, in no acute distress  HEENT: normal  Neck: no JVD, carotid bruits, or masses Cardiac: RRR; no murmurs, rubs, or gallops,no edema  Respiratory:  clear to auscultation bilaterally, normal work of breathing GI: soft, nontender, nondistended, + BS MS: no deformity or atrophy  Skin: warm and dry Neuro:  Strength and sensation are intact Psych: euthymic mood, full affect  EKG:  EKG is not ordered today. Personal review of the ekg ordered 01/12/2021 shows sinus rhythm, anterior Q waves  Recent Labs: 01/07/2021: BUN 16; Creatinine, Ser 0.90; Hemoglobin 12.7; Platelets 338; Potassium 3.7; Sodium 139    Lipid Panel  No results found for: CHOL, TRIG, HDL, CHOLHDL, VLDL, LDLCALC, LDLDIRECT   Wt Readings from Last 3 Encounters:  02/23/21 152 lb (68.9 kg)  01/12/21 149 lb 1.3 oz (67.6 kg)  01/07/21 150 lb (68 kg)      Other studies Reviewed: Additional studies/ records that were reviewed today include: TTE 01/31/21  Review of the above records today demonstrates:  1. Left ventricular ejection fraction, by estimation, is 60 to 65%. The  left ventricle has normal function. The left ventricle has no regional  wall motion abnormalities. Left ventricular diastolic parameters are  consistent with Grade I diastolic  dysfunction (impaired relaxation).  2. Right ventricular systolic function is normal. The right ventricular  size is normal.  3. The mitral valve is normal in structure. Trivial mitral  valve  regurgitation. No evidence of mitral stenosis.  4. The aortic valve is normal in structure. Aortic valve regurgitation is  trivial. Mild aortic valve sclerosis is present, with no evidence of  aortic valve stenosis.  5. The inferior vena cava is normal in size with greater than 50%  respiratory variability, suggesting right atrial pressure of 3 mmHg.   Cardiac monitor 02/18/21 personally reviewed Patient had a min HR of 53 bpm, max HR of 185 bpm, and avg HR of 83 bpm. Predominant underlying rhythm was Sinus Rhythm. First Degree AV Block was present. 8 Supraventricular Tachycardia runs occurred, the run with the fastest interval lasting 5 beats  with a max rate of 185 bpm, the longest lasting 9 beats with an avg rate of 129 bpm. Supraventricular Tachycardia was detected within +/- 45 seconds of symptomatic patient event(s).  Isolated SVEs were rare (<1.0%), SVE Couplets were rare (<1.0%), and SVE  Triplets were rare (<1.0%). Isolated VEs were rare (<1.0%, 1579), VE Couplets were rare (<1.0%, 1), and VE Triplets were rare (<1.0%, 1).   ASSESSMENT AND PLAN:  1.  Syncope: She wore a cardiac monitor that showed no obvious causes.  She was mostly in sinus rhythm without PACs or PVCs.  She did not have episodes of syncope while wearing her monitor.  It is certainly possible that she is having syncope due to Valsalva, though this would be quite a severe reaction.  I do feel that further monitoring is required.  Risk and benefits of Linq monitor implant have been discussed include bleeding and infection.  She is agreed to the procedure.  We Erskin Zinda check with her insurance to make sure that it Shereen Marton be covered.  2.  Hypertension: Mildly elevated today.  Plan per primary cardiology.  Case discussed with primary cardiology  Current medicines are reviewed at length with the patient today.   The patient does not have concerns regarding her medicines.  The following changes were made today:  none  Labs/  tests ordered today include:  No orders of the defined types were placed in this encounter.    Disposition:   FU with Ulis Kaps pending insurance review  Signed, Briannah Lona Jorja Loa, MD  02/23/2021 9:29 AM     [facility] [address] [place] [address] [phone] (office) [phone] (fax)

## 2021-02-24 ENCOUNTER — Ambulatory Visit: Payer: No Typology Code available for payment source | Admitting: Cardiology

## 2021-03-27 LAB — LIPID PANEL
Cholesterol: 212 — AB (ref 0–200)
HDL: 95 — AB (ref 35–70)
LDL Cholesterol: 101
Triglycerides: 93 (ref 40–160)

## 2021-03-27 LAB — HM HEPATITIS C SCREENING LAB: HM Hepatitis Screen: NEGATIVE

## 2021-05-08 NOTE — Progress Notes (Signed)
Electrophysiology Office Note   Date:  05/09/2021   ID:  Debbie Hatfield, DOB [birth year], MRN [id number]  PCP:  [facility]  Cardiologist:  [facility] Primary Electrophysiologist:  Henrick Mcgue Jorja Loa, MD    Chief Complaint: Syncope   History of Present Illness: Debbie Hatfield is a 65 y.o. female who is being seen today for the evaluation of Syncope at the request of [facility] Presenting today for electrophysiology evaluation.   She has a history of syncope.  She was seen in emergency room after an episode of syncope.  She fell and struck her face.  She was in the restroom at the time.  Evaluation in the emergency room showed no major abnormalities.  She was seen again in the emergency room 01/07/2021 for headaches and dizziness following a recurrent syncopal episode.  She was found to have symptoms consistent with a concussion.  No arrhythmia was found at the time.  She has a several year history of near syncope.  She has not had syncope prior to these events.  She wore a cardiac monitor that showed no major abnormalities.  She presents today for Linq monitor implant.  Today, denies symptoms of palpitations, chest pain, shortness of breath, orthopnea, PND, lower extremity edema, claudication, dizziness, presyncope, syncope, bleeding, or neurologic sequela. The patient is tolerating medications without difficulties. She has had no further episodes of syncope.    Past Medical History:  Diagnosis Date   Closed fracture of right distal radius    Gastroparesis    GERD (gastroesophageal reflux disease)    Hypertension    Irritable bowel disease    Lupus (HCC)    Major depression    Trigeminal neuralgia    Past Surgical History:  Procedure Laterality Date   CHOLECYSTECTOMY     COLONOSCOPY     ORIF WRIST FRACTURE Right 09/22/2020   Procedure: OPEN REDUCTION INTERNAL FIXATION (ORIF) WRIST FRACTURE;  Surgeon: Ernest Mallick, MD;   Location: [facility];  Service: Orthopedics;  Laterality: Right;      Current Outpatient Medications  Medication Sig Dispense Refill   acyclovir (ZOVIRAX) 800 MG tablet Take 800 mg by mouth once.     carbamazepine (CARBATROL) 300 MG 12 hr capsule Take 300 mg by mouth daily.      cloNIDine (CATAPRES) 0.1 MG tablet Take 1 tablet (0.1 mg total) by mouth 2 (two) times daily as needed. 180 tablet 3   esomeprazole (NEXIUM) 40 MG capsule Take 40 mg by mouth Nightly.     gabapentin (NEURONTIN) 300 MG capsule Take 600 mg by mouth 3 (three) times daily.     hydroxychloroquine (PLAQUENIL) 200 MG tablet Take 200 mg by mouth daily.     telmisartan-hydrochlorothiazide (MICARDIS HCT) 40-12.5 MG tablet Take 1 tablet by mouth daily. 90 tablet 3   tizanidine (ZANAFLEX) 2 MG capsule Take 1 capsule by mouth at bedtime.     traMADol (ULTRAM) 50 MG tablet Take 50 mg by mouth every 6 (six) hours as needed.     venlafaxine XR (EFFEXOR-XR) 150 MG 24 hr capsule Take 150 mg by mouth daily.     No current facility-administered medications for this visit.    Allergies:   Sulfa antibiotics and Penicillins   Social History:  The patient  reports that she has never smoked. She has never used smokeless tobacco. She reports current alcohol use. She reports that she does not use drugs.   Family History:  The patient's family history includes Alcoholism in her father; Colon cancer in her maternal grandfather; Congestive Heart Failure in her maternal grandfather and maternal grandmother; Depression in her mother; Diabetes in her maternal grandmother; Gout in her mother; Heart block in her mother; Hypertension in her mother and paternal grandfather; Macular degeneration in her mother; Stroke in her paternal grandfather.   ROS:  Please see the history of present illness.   Otherwise, review of systems is positive for none.   All other systems are reviewed and negative.   PHYSICAL EXAM: VS:  BP 130/70 (BP  Location: Left Arm, Patient Position: Sitting, Cuff Size: Normal)   Pulse 90   Ht 5\' 2"  (1.575 m)   Wt 147 lb (66.7 kg)   SpO2 98%   BMI 26.89 kg/m  , BMI Body mass index is 26.89 kg/m. GEN: Well nourished, well developed, in no acute distress  HEENT: normal  Neck: no JVD, carotid bruits, or masses Cardiac: RRR; no murmurs, rubs, or gallops,no edema  Respiratory:  clear to auscultation bilaterally, normal work of breathing GI: soft, nontender, nondistended, + BS MS: no deformity or atrophy  Skin: warm and dry Neuro:  Strength and sensation are intact Psych: euthymic mood, full affect  EKG:  EKG is not ordered today. Personal review of the ekg ordered 01/12/21 shows sinus rhythm   Recent Labs: 01/07/2021: BUN 16; Creatinine, Ser 0.90; Hemoglobin 12.7; Platelets 338; Potassium 3.7; Sodium 139    Lipid Panel  No results found for: CHOL, TRIG, HDL, CHOLHDL, VLDL, LDLCALC, LDLDIRECT   Wt Readings from Last 3 Encounters:  05/09/21 147 lb (66.7 kg)  02/23/21 152 lb (68.9 kg)  01/12/21 149 lb 1.3 oz (67.6 kg)      Other studies Reviewed: Additional studies/ records that were reviewed today include: TTE 01/31/21  Review of the above records today demonstrates:   1. Left ventricular ejection fraction, by estimation, is 60 to 65%. The  left ventricle has normal function. The left ventricle has no regional  wall motion abnormalities. Left ventricular diastolic parameters are  consistent with Grade I diastolic  dysfunction (impaired relaxation).   2. Right ventricular systolic function is normal. The right ventricular  size is normal.   3. The mitral valve is normal in structure. Trivial mitral valve  regurgitation. No evidence of mitral stenosis.   4. The aortic valve is normal in structure. Aortic valve regurgitation is  trivial. Mild aortic valve sclerosis is present, with no evidence of  aortic valve stenosis.   5. The inferior vena cava is normal in size with greater than 50%   respiratory variability, suggesting right atrial pressure of 3 mmHg.   Cardiac monitor 02/18/21 personally reviewed Patient had a min HR of 53 bpm, max HR of 185 bpm, and avg HR of 83 bpm. Predominant underlying rhythm was Sinus Rhythm. First Degree AV Block was present. 8 Supraventricular Tachycardia runs occurred, the run with the fastest interval lasting 5 beats  with a max rate of 185 bpm, the longest lasting 9 beats with an avg rate of 129 bpm. Supraventricular Tachycardia was detected within +/- 45 seconds of symptomatic patient event(s). Isolated SVEs were rare (<1.0%), SVE Couplets were rare (<1.0%), and SVE  Triplets were rare (<1.0%). Isolated VEs were rare (<1.0%, 1579), VE Couplets were rare (<1.0%, 1), and VE Triplets were rare (<1.0%, 1).   ASSESSMENT AND PLAN:  1.  Syncope: Patient wore a cardiac monitor that showed no causes.  Was mostly in sinus rhythm  with out PACs or PVCs.  We Lovenia Debruler plan for Linq monitor.  Risks and benefits of been discussed include bleeding and infection.  She understands the risks and has agreed to the procedure.  2.  Hypertension:well controlled  Current medicines are reviewed at length with the patient today.   The patient does not have concerns regarding her medicines.  The following changes were made today:  none  Labs/ tests ordered today include:  No orders of the defined types were placed in this encounter.    Disposition:   FU with Jaelon Gatley pending LINQ results  Signed, Malakye Nolden Jorja Loa, MD  05/09/2021 8:48 AM     [facility] [address] [place] [address] [phone] (office) [phone] (fax)  SURGEON:  Loman Brooklyn, MD     PREPROCEDURE DIAGNOSIS:  Syncope    POSTPROCEDURE DIAGNOSIS:  Syncope     PROCEDURES:   1. Implantable loop recorder implantation    INTRODUCTION:  RIHANA KIDDY is a 65 y.o. female with a history of syncope who presents today for implantable loop implantation.  The  patient has had syncope without a cause identified.   she has worn telemetry previously during which she did not have arrhythmias.  There is significant concern for possible arrhythmia as the cause for the syncope. The patient therefore presents today for implantable loop implantation.     DESCRIPTION OF PROCEDURE:  Informed written consent was obtained, and the patient was brought to the electrophysiology lab in a fasting state.  The patient required no sedation for the procedure today.  Mapping over the patient's chest was performed by the EP lab staff to identify the area where electrograms were most prominent for ILR recording.  This area was found to be the left parasternal region over the 3rd-4th intercostal space. The patients left chest was therefore prepped and draped in the usual sterile fashion by the EP lab staff. The skin overlying the left parasternal region was infiltrated with lidocaine for local analgesia.  A 0.5-cm incision was made over the left parasternal region over the 3rd intercostal space.  A subcutaneous ILR pocket was fashioned using a combination of sharp and blunt dissection.  A Medtronic Reveal Linq model [place] [id number] implantable loop recorder was then placed into the pocket  R waves were very prominent and measured 0.57mV.  Steri- Strips and a sterile dressing were then applied.  There were no early apparent complications.     CONCLUSIONS:   1. Successful implantation of a Medtronic Reveal LINQ implantable loop recorder for syncope  2. No early apparent complications.   Lasheika Ortloff Jorja Loa, MD 05/09/2021 8:48 AM

## 2021-05-09 ENCOUNTER — Ambulatory Visit (INDEPENDENT_AMBULATORY_CARE_PROVIDER_SITE_OTHER): Payer: No Typology Code available for payment source | Admitting: Cardiology

## 2021-05-09 ENCOUNTER — Encounter: Payer: Self-pay | Admitting: Cardiology

## 2021-05-09 ENCOUNTER — Other Ambulatory Visit: Payer: Self-pay

## 2021-05-09 VITALS — BP 130/70 | HR 90 | Ht 62.0 in | Wt 147.0 lb

## 2021-05-09 DIAGNOSIS — R55 Syncope and collapse: Secondary | ICD-10-CM

## 2021-05-09 NOTE — Patient Instructions (Signed)
Medication Instructions:  Your physician recommends that you continue on your current medications as directed. Please refer to the Current Medication list given to you today.  *If you need a refill on your cardiac medications before your next appointment, please call your pharmacy*   Lab Work: None ordered   Testing/Procedures: None ordered   Follow-Up: At [facility], you and your health needs are our priority.  As part of our continuing mission to provide you with exceptional heart care, we have created designated Provider Care Teams.  These Care Teams include your primary Cardiologist (physician) and Advanced Practice Providers (APPs -  Physician Assistants and Nurse Practitioners) who all work together to provide you with the care you need, when you need it.  Your next appointment:   As needed  The format for your next appointment:   In Person  Provider:   Loman Brooklyn, MD    Thank you for choosing [facility]!!   Dory Horn, RN [phone]   Other Instructions   Implantable Loop Recorder Placement, Care After This sheet gives you information about how to care for yourself after your procedure. Your health care provider may also give you more specific instructions. If you have problems or questions, contact your health careprovider. What can I expect after the procedure? After the procedure, it is common to have: Soreness or discomfort near the incision. Some swelling or bruising near the incision. Follow these instructions at home: Incision care  Follow instructions from your health care provider about how to take care of your incision. Make sure you: Wash your hands with soap and water before you change your bandage (dressing). If soap and water are not available, use hand sanitizer. Change your dressing as told by your health care provider. Keep your dressing dry. Leave stitches (sutures), skin glue, or adhesive strips in place. These skin closures  may need to stay in place for 2 weeks or longer. If adhesive strip edges start to loosen and curl up, you may trim the loose edges. Do not remove adhesive strips completely unless your health care provider tells you to do that. Check your incision area every day for signs of infection. Check for: Redness, swelling, or pain. Fluid or blood. Warmth. Pus or a bad smell. Do not take baths, swim, or use a hot tub until your health care provider approves. Ask your health care provider if you can take showers.  Activity  Return to your normal activities as told by your health care provider. Ask your health care provider what activities are safe for you. Do not drive for 24 hours if you were given a sedative during your procedure.  General instructions Follow instructions from your health care provider about how to manage your implantable loop recorder and transmit the information. Learn how to activate a recording if this is necessary for your type of device. Do not go through a metal detection gate, and do not let someone hold a metal detector over your chest. Show your ID card. Do not have an MRI unless you check with your health care provider first. Take over-the-counter and prescription medicines only as told by your health care provider. Keep all follow-up visits as told by your health care provider. This is important. Contact a health care provider if: You have redness, swelling, or pain around your incision. You have a fever. You have pain that is not relieved by your pain medicine. You have triggered your device because of fainting (syncope) or because of  a heartbeat that feels like it is racing, slow, fluttering, or skipping (palpitations). Get help right away if you have: Chest pain. Difficulty breathing. Summary After the procedure, it is common to have soreness or discomfort near the incision. Change your dressing as told by your health care provider. Follow instructions from your  health care provider about how to manage your implantable loop recorder and transmit the information. Keep all follow-up visits as told by your health care provider. This is important. This information is not intended to replace advice given to you by your health care provider. Make sure you discuss any questions you have with your healthcare provider. Document Revised: 11/16/2017 Document Reviewed: 11/16/2017 Elsevier Patient Education  2022 [employer].

## 2021-06-13 ENCOUNTER — Ambulatory Visit (INDEPENDENT_AMBULATORY_CARE_PROVIDER_SITE_OTHER): Payer: No Typology Code available for payment source

## 2021-06-13 DIAGNOSIS — R55 Syncope and collapse: Secondary | ICD-10-CM | POA: Diagnosis not present

## 2021-06-13 LAB — CUP PACEART REMOTE DEVICE CHECK
Date Time Interrogation Session: 20220830091431
Implantable Pulse Generator Implant Date: 20220728

## 2021-06-26 NOTE — Progress Notes (Signed)
Carelink Summary Report / Loop Recorder 

## 2021-07-16 LAB — CUP PACEART REMOTE DEVICE CHECK
Date Time Interrogation Session: 20221002091351
Implantable Pulse Generator Implant Date: 20220728

## 2021-07-17 ENCOUNTER — Ambulatory Visit (INDEPENDENT_AMBULATORY_CARE_PROVIDER_SITE_OTHER): Payer: No Typology Code available for payment source

## 2021-07-17 DIAGNOSIS — R55 Syncope and collapse: Secondary | ICD-10-CM

## 2021-07-25 NOTE — Progress Notes (Signed)
Carelink Summary Report / Loop Recorder 

## 2021-08-20 LAB — CUP PACEART REMOTE DEVICE CHECK
Date Time Interrogation Session: 20221104091405
Implantable Pulse Generator Implant Date: 20220728

## 2021-08-21 ENCOUNTER — Ambulatory Visit (INDEPENDENT_AMBULATORY_CARE_PROVIDER_SITE_OTHER): Payer: No Typology Code available for payment source

## 2021-08-21 DIAGNOSIS — R55 Syncope and collapse: Secondary | ICD-10-CM | POA: Diagnosis not present

## 2021-08-28 NOTE — Progress Notes (Signed)
Carelink Summary Report / Loop Recorder 

## 2021-09-25 ENCOUNTER — Ambulatory Visit (INDEPENDENT_AMBULATORY_CARE_PROVIDER_SITE_OTHER): Payer: No Typology Code available for payment source

## 2021-09-25 DIAGNOSIS — R55 Syncope and collapse: Secondary | ICD-10-CM

## 2021-09-26 LAB — CUP PACEART REMOTE DEVICE CHECK
Date Time Interrogation Session: 20221207091231
Implantable Pulse Generator Implant Date: 20220728

## 2021-10-05 NOTE — Progress Notes (Signed)
Carelink Summary Report / Loop Recorder 

## 2021-10-30 ENCOUNTER — Ambulatory Visit (INDEPENDENT_AMBULATORY_CARE_PROVIDER_SITE_OTHER): Payer: No Typology Code available for payment source

## 2021-10-30 DIAGNOSIS — R55 Syncope and collapse: Secondary | ICD-10-CM

## 2021-10-31 LAB — CUP PACEART REMOTE DEVICE CHECK
Date Time Interrogation Session: 20230115230737
Implantable Pulse Generator Implant Date: 20220728

## 2021-11-09 NOTE — Progress Notes (Signed)
Carelink Summary Report / Loop Recorder 

## 2021-11-15 ENCOUNTER — Other Ambulatory Visit: Payer: Self-pay

## 2021-11-15 ENCOUNTER — Ambulatory Visit (INDEPENDENT_AMBULATORY_CARE_PROVIDER_SITE_OTHER): Payer: Medicare Other | Admitting: Internal Medicine

## 2021-11-15 ENCOUNTER — Encounter: Payer: Self-pay | Admitting: Internal Medicine

## 2021-11-15 VITALS — BP 132/70 | HR 93 | Temp 97.7°F | Ht 62.0 in | Wt 139.0 lb

## 2021-11-15 DIAGNOSIS — F3341 Major depressive disorder, recurrent, in partial remission: Secondary | ICD-10-CM

## 2021-11-15 DIAGNOSIS — Z23 Encounter for immunization: Secondary | ICD-10-CM | POA: Diagnosis not present

## 2021-11-15 DIAGNOSIS — F411 Generalized anxiety disorder: Secondary | ICD-10-CM | POA: Diagnosis not present

## 2021-11-15 DIAGNOSIS — I10 Essential (primary) hypertension: Secondary | ICD-10-CM

## 2021-11-15 DIAGNOSIS — M329 Systemic lupus erythematosus, unspecified: Secondary | ICD-10-CM | POA: Diagnosis not present

## 2021-11-15 DIAGNOSIS — N1831 Chronic kidney disease, stage 3a: Secondary | ICD-10-CM | POA: Insufficient documentation

## 2021-11-15 DIAGNOSIS — Z1231 Encounter for screening mammogram for malignant neoplasm of breast: Secondary | ICD-10-CM | POA: Insufficient documentation

## 2021-11-15 DIAGNOSIS — K635 Polyp of colon: Secondary | ICD-10-CM | POA: Insufficient documentation

## 2021-11-15 HISTORY — DX: Encounter for immunization: Z23

## 2021-11-15 HISTORY — DX: Chronic kidney disease, stage 3a: N18.31

## 2021-11-15 HISTORY — DX: Major depressive disorder, recurrent, in partial remission: F33.41

## 2021-11-15 HISTORY — DX: Encounter for screening mammogram for malignant neoplasm of breast: Z12.31

## 2021-11-15 HISTORY — DX: Generalized anxiety disorder: F41.1

## 2021-11-15 LAB — BASIC METABOLIC PANEL
BUN: 19 mg/dL (ref 6–23)
CO2: 30 mEq/L (ref 19–32)
Calcium: 9.9 mg/dL (ref 8.4–10.5)
Chloride: 105 mEq/L (ref 96–112)
Creatinine, Ser: 1.06 mg/dL (ref 0.40–1.20)
GFR: 55.29 mL/min — ABNORMAL LOW (ref >60.00)
Glucose, Bld: 94 mg/dL (ref 70–99)
Potassium: 4.3 mEq/L (ref 3.5–5.1)
Sodium: 143 mEq/L (ref 135–145)

## 2021-11-15 LAB — CBC WITH DIFFERENTIAL/PLATELET
Basophils Absolute: 0 10*3/uL (ref 0.0–0.1)
Basophils Relative: 0.5 % (ref 0.0–3.0)
Eosinophils Absolute: 0 10*3/uL (ref 0.0–0.7)
Eosinophils Relative: 0.6 % (ref 0.0–5.0)
HCT: 38.8 % (ref 36.0–46.0)
Hemoglobin: 12.6 g/dL (ref 12.0–15.0)
Lymphocytes Relative: 28.2 % (ref 12.0–46.0)
Lymphs Abs: 2.2 10*3/uL (ref 0.7–4.0)
MCHC: 32.5 g/dL (ref 30.0–36.0)
MCV: 86.8 fl (ref 78.0–100.0)
Monocytes Absolute: 1.2 10*3/uL — ABNORMAL HIGH (ref 0.1–1.0)
Monocytes Relative: 15.2 % — ABNORMAL HIGH (ref 3.0–12.0)
Neutro Abs: 4.4 10*3/uL (ref 1.4–7.7)
Neutrophils Relative %: 55.5 % (ref 43.0–77.0)
Platelets: 335 10*3/uL (ref 150.0–400.0)
RBC: 4.47 Mil/uL (ref 3.87–5.11)
RDW: 13.4 % (ref 11.5–15.5)
WBC: 7.8 10*3/uL (ref 4.0–10.5)

## 2021-11-15 LAB — TSH: TSH: 1.45 u[IU]/mL (ref 0.35–5.50)

## 2021-11-15 MED ORDER — TELMISARTAN-HCTZ 40-12.5 MG PO TABS: 1.0000 | ORAL_TABLET | Freq: Every day | ORAL | 0 refills | Status: DC

## 2021-11-15 MED ORDER — HYDROXYZINE HCL 10 MG PO TABS: 10.0000 mg | ORAL_TABLET | Freq: Three times a day (TID) | ORAL | 0 refills | Status: DC | PRN

## 2021-11-15 MED ORDER — VENLAFAXINE HCL ER 37.5 MG PO CP24: 75.0000 mg | ORAL_CAPSULE | Freq: Every day | ORAL | 1 refills | Status: DC

## 2021-11-15 MED ORDER — SHINGRIX 50 MCG/0.5ML IM SUSR: 0.5000 mL | Freq: Once | INTRAMUSCULAR | 1 refills | Status: AC

## 2021-11-15 MED ORDER — HYDROXYCHLOROQUINE SULFATE 200 MG PO TABS: 200.0000 mg | ORAL_TABLET | Freq: Every day | ORAL | 0 refills | Status: DC

## 2021-11-15 NOTE — Patient Instructions (Signed)

## 2021-11-15 NOTE — Progress Notes (Signed)
Subjective:  Patient ID: Debbie Hatfield, female    DOB: 17-Jul-1956  Age: 66 y.o. MRN: [id number]  CC: Hypertension  This visit occurred during the SARS-CoV-2 public health emergency.  Safety protocols were in place, including screening questions prior to the visit, additional usage of staff PPE, and extensive cleaning of exam room while observing appropriate contact time as indicated for disinfecting solutions.    HPI Debbie Hatfield presents for establishing.  She would like to lower the dose of venlafaxine.  She has a history of serotonin syndrome and feels like the current dose is too high because she struggles with a sensation of heart pounding, anxiety, insomnia, and a panicky sensation.  She has a history of mixed connective tissue disease and lupus and asked that I refill her hydroxychloroquine.  She has had no recent episodes of joint pain or swelling.  She is active and denies chest pain, shortness of breath, diaphoresis, dizziness, or lightheadedness.  History Debbie Hatfield has a past medical history of Closed fracture of right distal radius, Gastroparesis, GERD (gastroesophageal reflux disease), Hypertension, Irritable bowel disease, Lupus ([place]), Major depression, and Trigeminal neuralgia.   She has a past surgical history that includes Cholecystectomy; Colonoscopy; and ORIF wrist fracture (Right, 09/22/2020).   Her family history includes Alcoholism in her father; Colon cancer in her maternal grandfather; Congestive Heart Failure in her maternal grandfather and maternal grandmother; Depression in her mother; Diabetes in her maternal grandmother; Gout in her mother; Heart block in her mother; Hypertension in her mother and paternal grandfather; Macular degeneration in her mother; Stroke in her paternal grandfather.She reports that she has never smoked. She has never used smokeless tobacco. She reports current alcohol use. She reports that she does not use drugs.  Outpatient Medications Prior to  Visit  Medication Sig Dispense Refill   acyclovir (ZOVIRAX) 800 MG tablet Take 800 mg by mouth once.     carbamazepine (CARBATROL) 300 MG 12 hr capsule Take 300 mg by mouth daily.      esomeprazole (NEXIUM) 40 MG capsule Take 40 mg by mouth Nightly.     gabapentin (NEURONTIN) 300 MG capsule Take 600 mg by mouth 3 (three) times daily.     tizanidine (ZANAFLEX) 2 MG capsule Take 1 capsule by mouth at bedtime.     traMADol (ULTRAM) 50 MG tablet Take 50 mg by mouth every 6 (six) hours as needed.     hydroxychloroquine (PLAQUENIL) 200 MG tablet Take 200 mg by mouth daily.     telmisartan-hydrochlorothiazide (MICARDIS HCT) 40-12.5 MG tablet Take 1 tablet by mouth daily. 90 tablet 3   venlafaxine XR (EFFEXOR-XR) 150 MG 24 hr capsule Take 150 mg by mouth daily.     cloNIDine (CATAPRES) 0.1 MG tablet Take 1 tablet (0.1 mg total) by mouth 2 (two) times daily as needed. (Patient not taking: Reported on 11/15/2021) 180 tablet 3   No facility-administered medications prior to visit.    ROS Review of Systems  Constitutional:  Negative for chills, diaphoresis, fatigue and fever.  HENT: Negative.    Eyes: Negative.   Respiratory:  Negative for cough, chest tightness, shortness of breath and wheezing.   Cardiovascular:  Negative for chest pain, palpitations and leg swelling.  Gastrointestinal:  Negative for abdominal pain, diarrhea, nausea and vomiting.  Endocrine: Negative.   Genitourinary: Negative.   Musculoskeletal:  Negative for joint swelling and myalgias.  Skin: Negative.   Neurological:  Negative for dizziness, light-headedness and headaches.  Hematological:  Negative for adenopathy.  Does not bruise/bleed easily.  Psychiatric/Behavioral:  Positive for dysphoric mood and sleep disturbance. Negative for confusion, decreased concentration, self-injury and suicidal ideas. The patient is nervous/anxious.    Objective:  BP 132/70 (BP Location: Left Arm, Patient Position: Sitting, Cuff Size: Large)     Pulse 93    Temp 97.7 F (36.5 C) (Oral)    Ht 5\' 2"  (1.575 m)    Wt 139 lb (63 kg)    SpO2 98%    BMI 25.42 kg/m   Physical Exam Vitals reviewed.  HENT:     Nose: Nose normal.     Mouth/Throat:     Mouth: Mucous membranes are moist.  Eyes:     General: No scleral icterus.    Conjunctiva/sclera: Conjunctivae normal.  Cardiovascular:     Rate and Rhythm: Normal rate and regular rhythm.     Heart sounds: S1 normal and S2 normal. Murmur heard.  Decrescendo systolic murmur is present with a grade of 1/6.    No gallop.     Comments: 1/6 Harsh SEM RUSB Pulmonary:     Effort: Pulmonary effort is normal.     Breath sounds: No stridor. No wheezing, rhonchi or rales.  Abdominal:     General: Abdomen is flat.     Palpations: There is no mass.     Tenderness: There is no abdominal tenderness. There is no guarding.     Hernia: No hernia is present.  Musculoskeletal:        General: No swelling, tenderness or deformity.     Right lower leg: No edema.     Left lower leg: No edema.  Skin:    General: Skin is warm and dry.  Neurological:     General: No focal deficit present.     Mental Status: She is alert.  Psychiatric:        Mood and Affect: Mood normal.        Behavior: Behavior normal.    Lab Results  Component Value Date   WBC 7.8 11/15/2021   HGB 12.6 11/15/2021   HCT 38.8 11/15/2021   PLT 335.0 11/15/2021   GLUCOSE 94 11/15/2021   CHOL 212 (A) 03/27/2021   TRIG 93 03/27/2021   HDL 95 (A) 03/27/2021   LDLCALC 101 03/27/2021   NA 143 11/15/2021   K 4.3 11/15/2021   CL 105 11/15/2021   CREATININE 1.06 11/15/2021   BUN 19 11/15/2021   CO2 30 11/15/2021   TSH 1.45 11/15/2021     Assessment & Plan:   Debbie Hatfield was seen today for hypertension.  Diagnoses and all orders for this visit:  Lupus ([place]) -     hydroxychloroquine (PLAQUENIL) 200 MG tablet; Take 1 tablet (200 mg total) by mouth daily.  Primary hypertension- Her blood pressure is adequately well  controlled. -     telmisartan-hydrochlorothiazide (MICARDIS HCT) 40-12.5 MG tablet; Take 1 tablet by mouth daily. -     Basic metabolic panel; Future -     TSH; Future -     CBC with Differential/Platelet; Future -     CBC with Differential/Platelet -     TSH -     Basic metabolic panel  Need for shingles vaccine -     Zoster Vaccine Adjuvanted [facility]) injection; Inject 0.5 mLs into the muscle once for 1 dose.  Recurrent major depressive disorder, in partial remission (HCC) -     venlafaxine XR (EFFEXOR XR) 37.5 MG 24 hr capsule; Take 2 capsules (75 mg  total) by mouth daily with breakfast. -     TSH; Future -     TSH  Need for vaccination -     Pneumococcal conjugate vaccine 20-valent (Prevnar 20)  Visit for screening mammogram -     MM DIGITAL SCREENING BILATERAL; Future  Polyp of colon, unspecified part of colon, unspecified type -     Ambulatory referral to Gastroenterology  GAD (generalized anxiety disorder) -     hydrOXYzine (ATARAX) 10 MG tablet; Take 1 tablet (10 mg total) by mouth every 8 (eight) hours as needed.  Stage 3a chronic kidney disease ([place])- Her blood pressure is adequately well controlled.  She will avoid nephrotoxic agents.   I have discontinued Arrie [employer]. Hornig's venlafaxine XR and cloNIDine. I have also changed her hydroxychloroquine. Additionally, I am having her start on Shingrix, venlafaxine XR, and hydrOXYzine. Lastly, I am having her maintain her esomeprazole, acyclovir, carbamazepine, traMADol, gabapentin, tizanidine, and telmisartan-hydrochlorothiazide.  Meds ordered this encounter  Medications   hydroxychloroquine (PLAQUENIL) 200 MG tablet    Sig: Take 1 tablet (200 mg total) by mouth daily.    Dispense:  90 tablet    Refill:  0   telmisartan-hydrochlorothiazide (MICARDIS HCT) 40-12.5 MG tablet    Sig: Take 1 tablet by mouth daily.    Dispense:  90 tablet    Refill:  0   Zoster Vaccine Adjuvanted [facility]) injection    Sig: Inject 0.5 mLs  into the muscle once for 1 dose.    Dispense:  0.5 mL    Refill:  1   venlafaxine XR (EFFEXOR XR) 37.5 MG 24 hr capsule    Sig: Take 2 capsules (75 mg total) by mouth daily with breakfast.    Dispense:  180 capsule    Refill:  1   hydrOXYzine (ATARAX) 10 MG tablet    Sig: Take 1 tablet (10 mg total) by mouth every 8 (eight) hours as needed.    Dispense:  270 tablet    Refill:  0     Follow-up: Return in about 6 months (around 05/15/2022).  Scarlette Calico, MD

## 2021-12-04 ENCOUNTER — Ambulatory Visit (INDEPENDENT_AMBULATORY_CARE_PROVIDER_SITE_OTHER): Payer: Medicare Other

## 2021-12-04 DIAGNOSIS — R55 Syncope and collapse: Secondary | ICD-10-CM

## 2021-12-04 LAB — CUP PACEART REMOTE DEVICE CHECK
Date Time Interrogation Session: 20230219231411
Implantable Pulse Generator Implant Date: 20220728

## 2021-12-08 IMAGING — CT CT HEAD W/O CM
2 series · 15 of 30 positions shown, 17 images · non-contrast
Comparison: 12/29/2020

CLINICAL DATA: Hypertension

EXAM:
CT HEAD WITHOUT CONTRAST
TECHNIQUE: Contiguous axial images were obtained from the base of the skull
through the vertex without intravenous contrast.

[Series 2: head wo · axial · 0.39mm/px · z∈[-358,-238]mm · 7 of 34 slices shown, 9 images]
[im 5/34  brain]
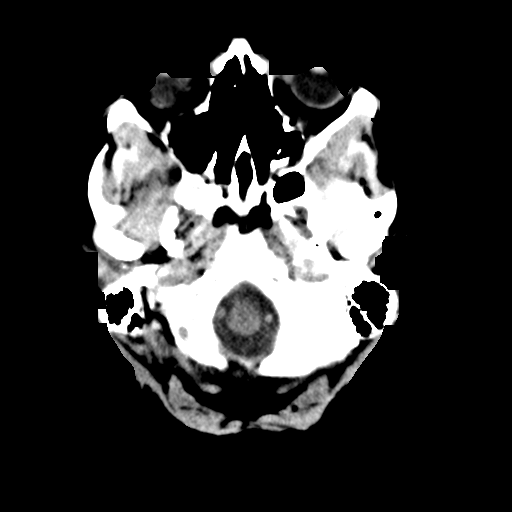
[im 5/34  bone]
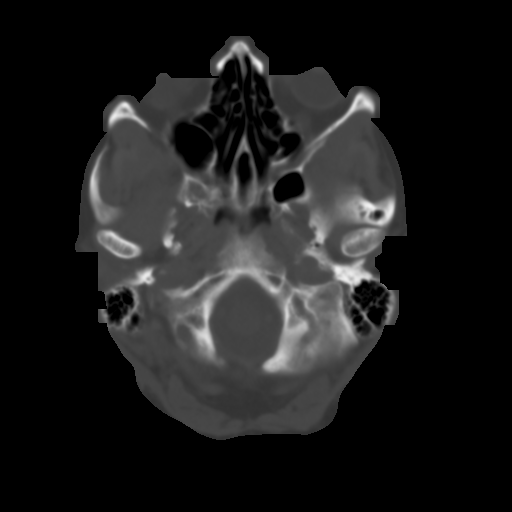
[im 9/34  brain]
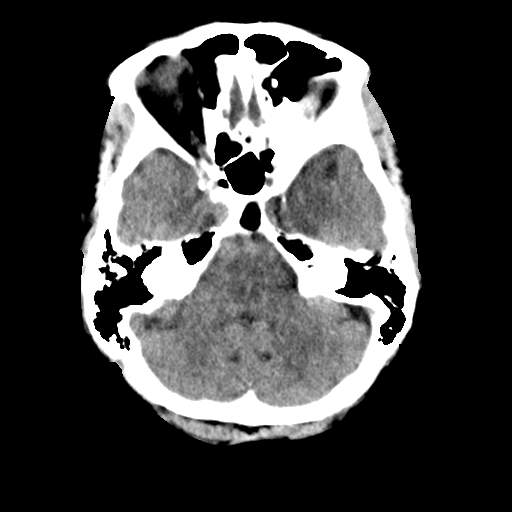
[im 13/34  brain]
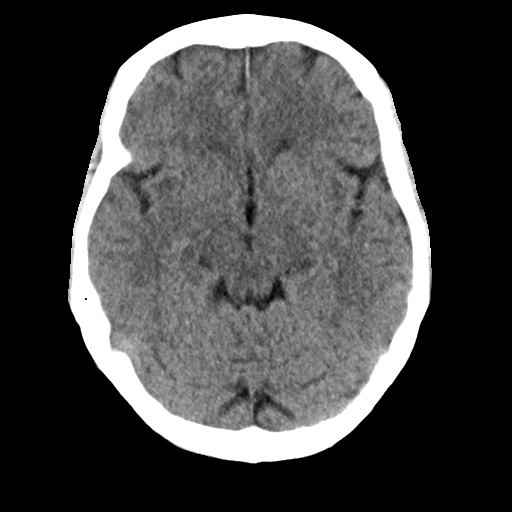
[im 17/34  brain]
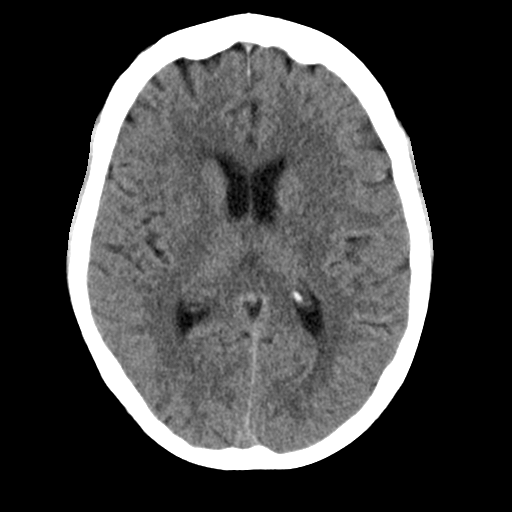
[im 21/34  brain]
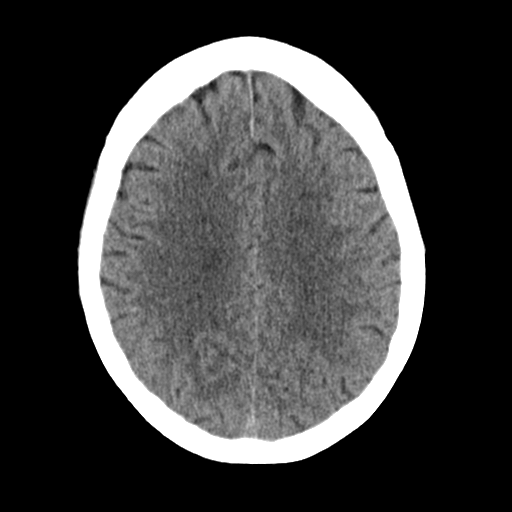
[im 21/34  bone]
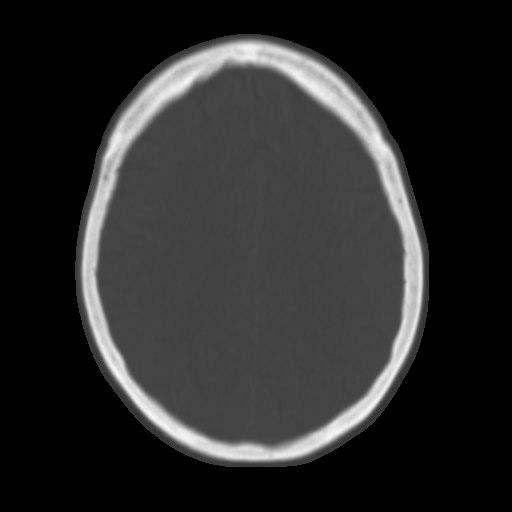
[im 25/34  brain]
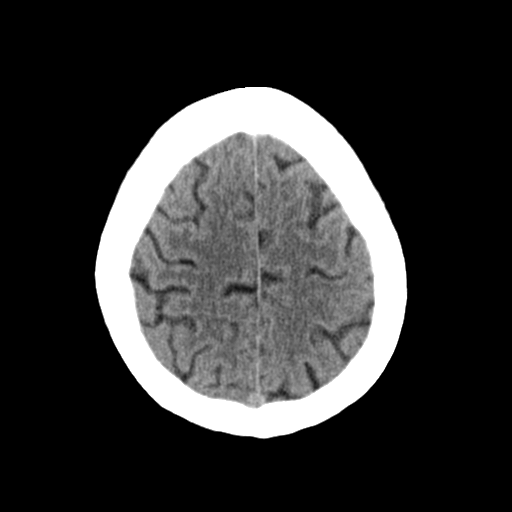
[im 29/34  brain]
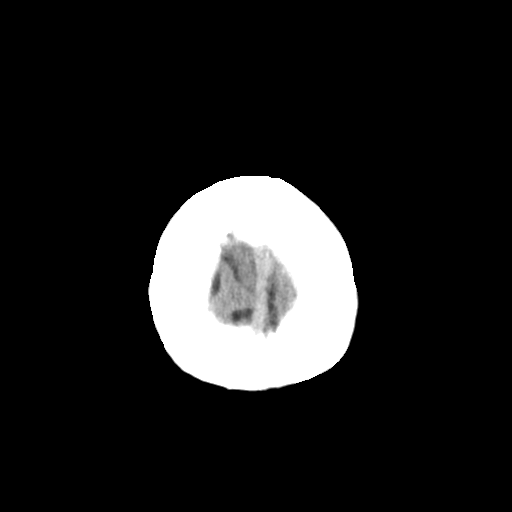

[Series 3: head bone · axial · 0.39mm/px · z∈[-362,-230]mm · 8 of 84 slices shown]
[im 9/84  bone]
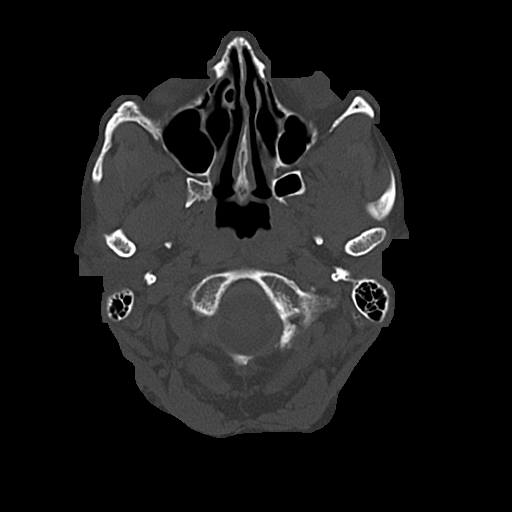
[im 17/84  bone]
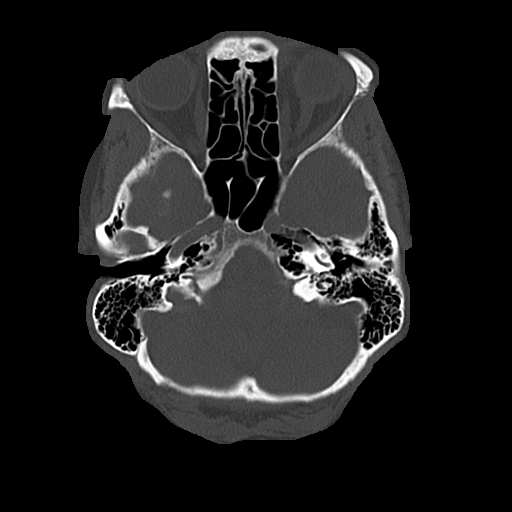
[im 25/84  bone]
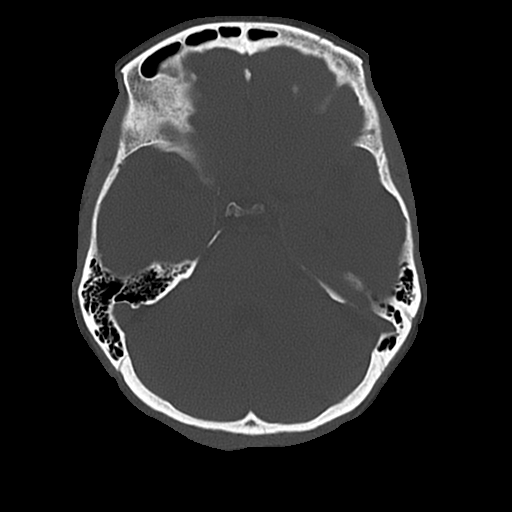
[im 38/84  bone]
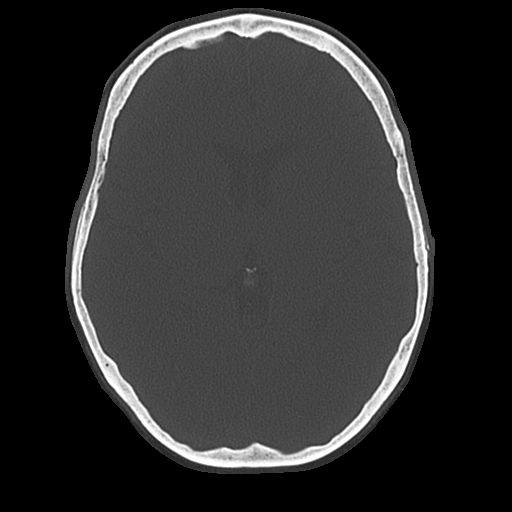
[im 46/84  bone]
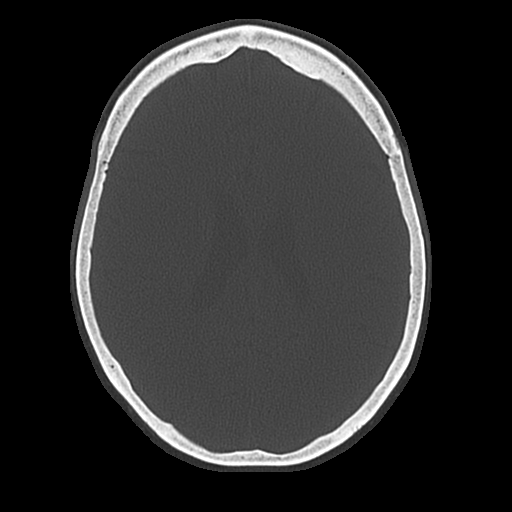
[im 59/84  bone]
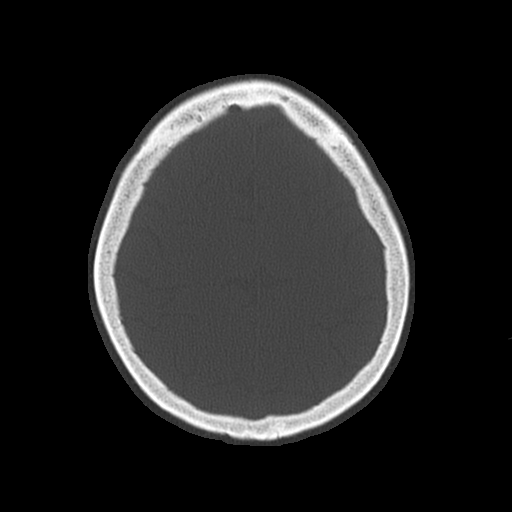
[im 67/84  bone]
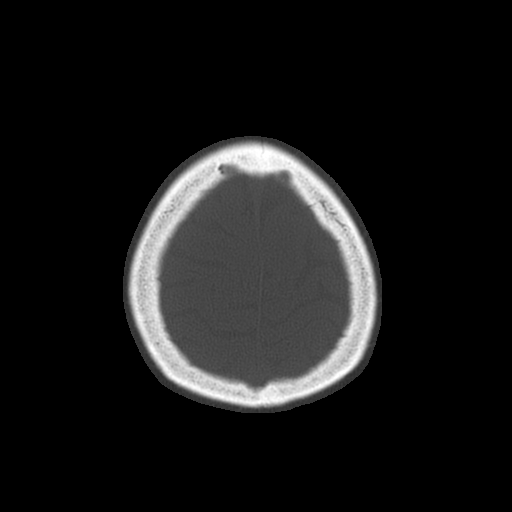
[im 75/84  bone]
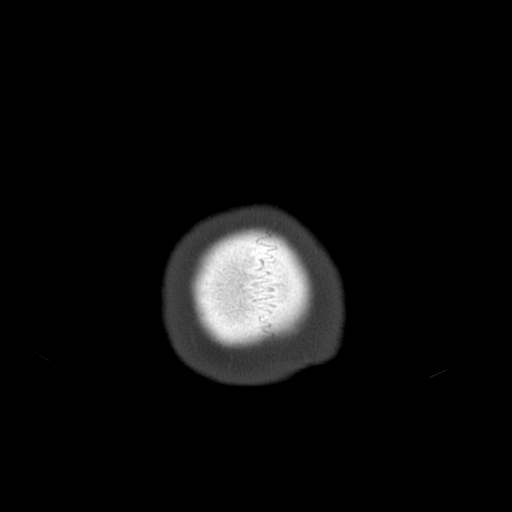

[15 of 30 positions shown; findings below may reference images not displayed]

FINDINGS: Brain: No evidence of acute infarction, hemorrhage, hydrocephalus,
extra-axial collection or mass lesion/mass effect.

Vascular: Atherosclerotic calcifications involving the large vessels
of the skull base. No unexpected hyperdense vessel.

Skull: Normal. Negative for fracture or focal lesion.

Sinuses/Orbits: No acute finding.

Other: None.
IMPRESSION: No acute intracranial findings.

## 2021-12-08 IMAGING — DX DG CHEST 2V
2 series · 2 of 2 positions shown · non-contrast
Comparison: None.

CLINICAL DATA: Chest pain, hypertension

EXAM:
CHEST - 2 VIEW

[chest pa]
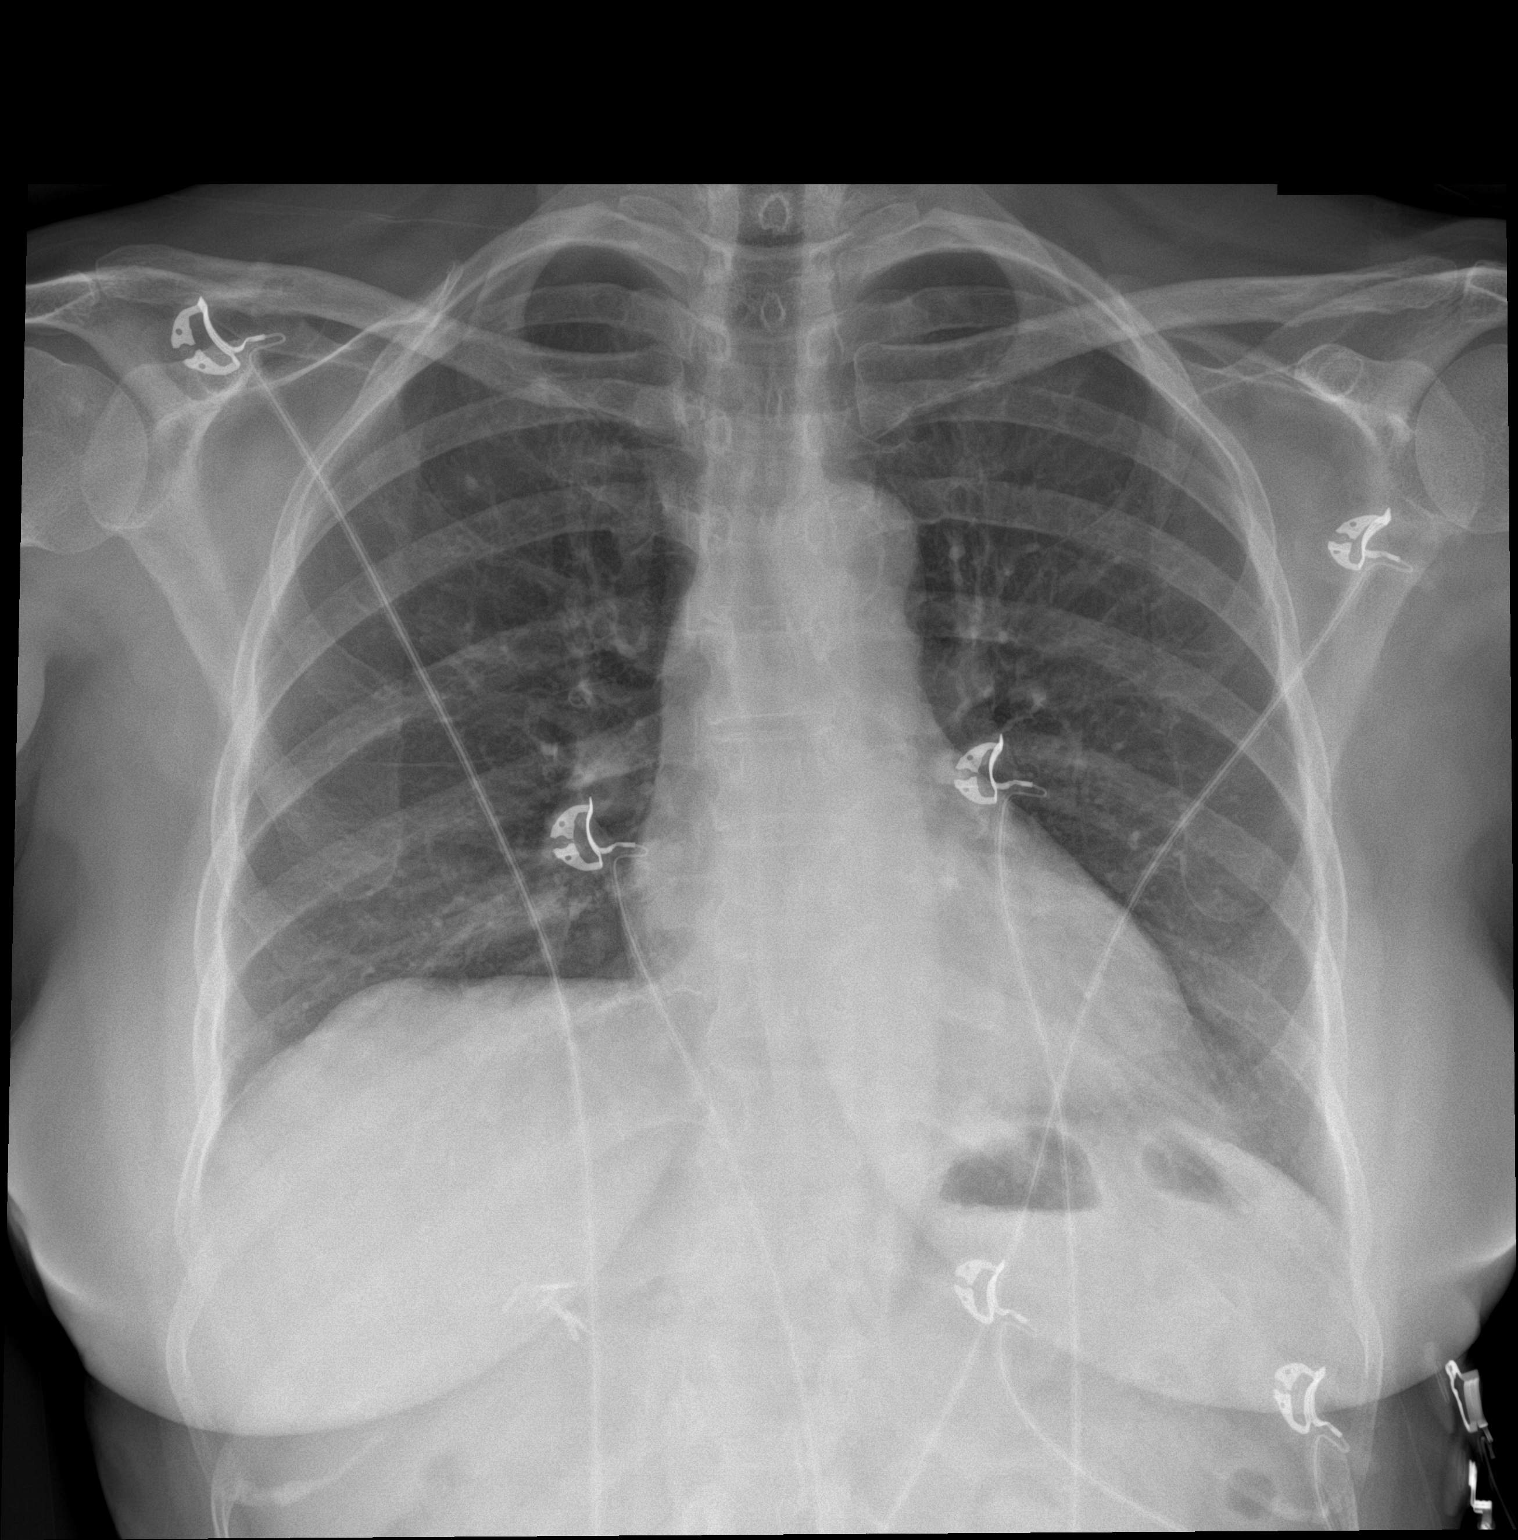

[chest lat]
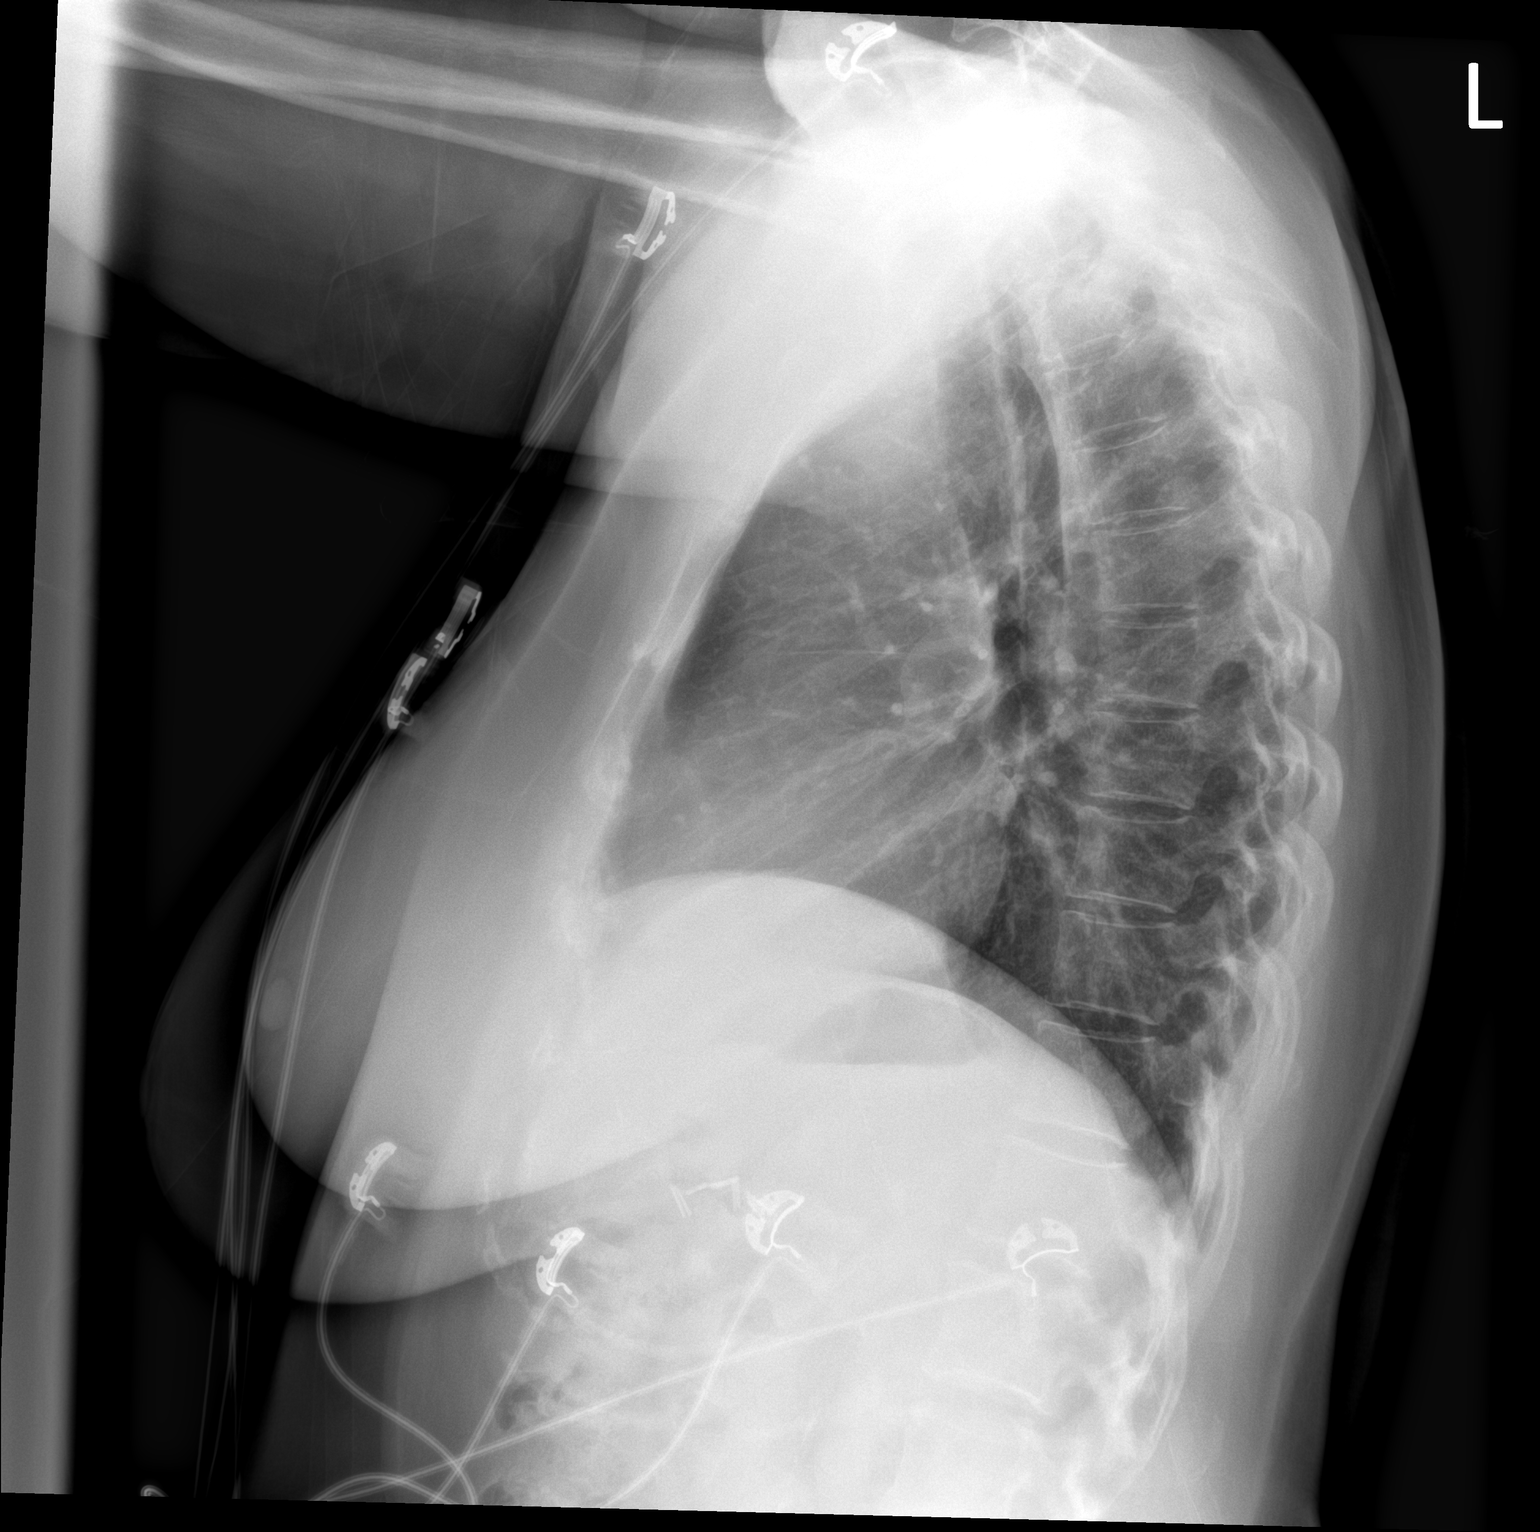

[2 of 2 positions shown; findings below may reference images not displayed]

FINDINGS: The heart size and mediastinal contours are within normal limits.
Atherosclerotic calcification of the aortic knob. 2-3 mm probable
granuloma at the right upper lobe. Minimal right basilar
atelectasis. Lungs are otherwise clear. No pleural effusion or
pneumothorax. The visualized skeletal structures are unremarkable.
IMPRESSION: Minimal right basilar atelectasis. Otherwise, no acute
cardiopulmonary findings.

## 2021-12-08 NOTE — Progress Notes (Signed)
Carelink Summary Report / Loop Recorder 

## 2021-12-26 ENCOUNTER — Ambulatory Visit
Admission: RE | Admit: 2021-12-26 | Discharge: 2021-12-26 | Disposition: A | Discharge status: Home / Self Care | Payer: Medicare Other | Source: Ambulatory Visit | Attending: Internal Medicine | Admitting: Internal Medicine

## 2021-12-26 DIAGNOSIS — Z1231 Encounter for screening mammogram for malignant neoplasm of breast: Secondary | ICD-10-CM

## 2022-01-01 ENCOUNTER — Encounter: Payer: Self-pay | Admitting: Internal Medicine

## 2022-01-01 NOTE — Progress Notes (Signed)
? ? ?Subjective:  ? ? Patient ID: Debbie Hatfield, female    DOB: 05/26/1956, 66 y.o.   MRN: [id number] ? ?This visit occurred during the SARS-CoV-2 public health emergency.  Safety protocols were in place, including screening questions prior to the visit, additional usage of staff PPE, and extensive cleaning of exam room while observing appropriate contact time as indicated for disinfecting solutions. ? ? ? ?HPI ?Debbie Hatfield is here for  ?Chief Complaint  ?Patient presents with  ? Medication discussion  ? ? 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 She has lost some of her central vision.  She also has macular degeneration.  She has been on the plaquenil for 30 + years.  She noticed changes in her vision 3-4 weeks ago.   ? ?She stopped the plaquenil Thursday - has increased fatigue and pain.  Has increased leg muscle pain.  She is concerned that without the Plaquenil her pain will continue to get worse-Lupus will flare.  She has not been on anything else for her lupus.  She was seeing a rheumatologist and last somewhat in May 2022, but changed insurances and was not able to see them.  She just recently started Medicare and can now see anyone, but would like to stay in the [facility] system.  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 ? ?She has a follow-up appointment with the retinal specialist in 3 weeks ? ? ? ? ?Medications and allergies reviewed with patient and updated if appropriate. ? ?Current Outpatient Medications on File Prior to Visit  ?Medication Sig Dispense Refill  ? acyclovir (ZOVIRAX) 800 MG tablet Take 800 mg by mouth once.    ? carbamazepine (CARBATROL) 300 MG 12 hr capsule Take 300 mg by mouth daily.     ? esomeprazole (NEXIUM) 40 MG capsule Take 40 mg by mouth Nightly.    ? gabapentin (NEURONTIN) 300 MG capsule Take 600 mg by mouth 3 (three) times daily.    ? hydroxychloroquine (PLAQUENIL)  200 MG tablet Take 1 tablet (200 mg total) by mouth daily. 90 tablet 0  ? hydrOXYzine (ATARAX) 10 MG tablet Take 1 tablet (10 mg total) by mouth every 8 (eight) hours as needed. 270 tablet 0  ? telmisartan-hydrochlorothiazide (MICARDIS HCT) 40-12.5 MG tablet Take 1 tablet by mouth daily. 90 tablet 0  ? tizanidine (ZANAFLEX) 2 MG capsule Take 1 capsule by mouth at bedtime.    ? traMADol (ULTRAM) 50 MG tablet Take 50 mg by mouth every 6 (six) hours as needed.    ? venlafaxine XR (EFFEXOR XR) 37.5 MG 24 hr capsule Take 2 capsules (75 mg total) by mouth daily with breakfast. 180 capsule 1  ? ?No current facility-administered medications on file prior to visit.  ? ? ?Review of Systems  ?Constitutional:  Positive for fatigue.  ?Musculoskeletal:  Positive for arthralgias and myalgias.  ? ?   ?Objective:  ? ?Vitals:  ? 01/02/22 1053  ?BP: 126/76  ?Pulse: 61  ?Temp: 98.1 ?F (36.7 ?C)  ?SpO2: 99%  ? ?BP Readings from Last 3 Encounters:  ?01/02/22 126/76  ?11/15/21 132/70  ?05/09/21 130/70  ? ?Wt Readings from Last 3 Encounters:  ?01/02/22 138 lb 6.4 oz (62.8 kg)  ?11/15/21 139 lb (63 kg)  ?05/09/21 147 lb (66.7 kg)  ? ?Body mass index is 25.31 kg/m?. ? ?  ?  Physical Exam ?Constitutional:   ?   General: She is not in acute distress. ?   Appearance: Normal appearance.  ?HENT:  ?   Head: Normocephalic and atraumatic.  ?Skin: ?   General: Skin is warm and dry.  ?   Findings: No rash.  ?Neurological:  ?   Mental Status: She is alert.  ? ?   ? ? ? ? ? ?Assessment & Plan:  ? ? ?Lupus: ?Chronic ?Has been on Plaquenil for 30+ years and has done well with it-lupus was controlled ?Noticed vision changes 3-4 weeks ago and saw ophthalmologist 5 days ago and was found to have retinal changes from Plaquenil and stopped it.  Also diagnosed with macular degeneration ?Will have follow-up with retinal specialist in 3 weeks ?Has noticed some increase in pain and fatigue since being off the Plaquenil-wonders what is next for the lupus ?Recommend  that she see a rheumatologist for further treatment-referral ordered ?She will call with any questions or concerns ? ? ? ? ?

## 2022-01-02 ENCOUNTER — Telehealth: Payer: Self-pay | Admitting: Internal Medicine

## 2022-01-02 ENCOUNTER — Ambulatory Visit (INDEPENDENT_AMBULATORY_CARE_PROVIDER_SITE_OTHER): Payer: Medicare Other | Admitting: Internal Medicine

## 2022-01-02 ENCOUNTER — Other Ambulatory Visit: Payer: Self-pay

## 2022-01-02 VITALS — BP 126/76 | HR 61 | Temp 98.1°F | Ht 62.0 in | Wt 138.4 lb

## 2022-01-02 DIAGNOSIS — M329 Systemic lupus erythematosus, unspecified: Secondary | ICD-10-CM

## 2022-01-02 NOTE — Telephone Encounter (Signed)
Upon check out of visit today, pt requested to change from current PCP to Dr. Lawerance Bach.  ? ?She gave no reason for wanting change- other than she "loved her" ? ?Is it okay to change? ?

## 2022-01-02 NOTE — Telephone Encounter (Signed)
I am not accepting new patients.

## 2022-01-02 NOTE — Patient Instructions (Signed)
? ? ? ? ?  A referral was ordered for rheumatology.     Someone from that office will call you to schedule an appointment.  ? ? ?

## 2022-01-04 ENCOUNTER — Encounter: Payer: Self-pay | Admitting: Internal Medicine

## 2022-01-04 DIAGNOSIS — N1831 Chronic kidney disease, stage 3a: Secondary | ICD-10-CM

## 2022-01-08 ENCOUNTER — Ambulatory Visit (INDEPENDENT_AMBULATORY_CARE_PROVIDER_SITE_OTHER): Payer: Medicare Other

## 2022-01-08 ENCOUNTER — Encounter: Payer: Self-pay | Admitting: Internal Medicine

## 2022-01-08 DIAGNOSIS — R55 Syncope and collapse: Secondary | ICD-10-CM

## 2022-01-09 LAB — CUP PACEART REMOTE DEVICE CHECK
Date Time Interrogation Session: 20230326230505
Implantable Pulse Generator Implant Date: 20220728

## 2022-01-10 ENCOUNTER — Encounter: Payer: Self-pay | Admitting: Internal Medicine

## 2022-01-17 NOTE — Progress Notes (Signed)
Carelink Summary Report / Loop Recorder 

## 2022-02-07 ENCOUNTER — Other Ambulatory Visit: Payer: Self-pay | Admitting: Internal Medicine

## 2022-02-07 DIAGNOSIS — I10 Essential (primary) hypertension: Secondary | ICD-10-CM

## 2022-02-13 ENCOUNTER — Ambulatory Visit (INDEPENDENT_AMBULATORY_CARE_PROVIDER_SITE_OTHER): Payer: Medicare Other

## 2022-02-13 DIAGNOSIS — R55 Syncope and collapse: Secondary | ICD-10-CM

## 2022-02-13 LAB — CUP PACEART REMOTE DEVICE CHECK
Date Time Interrogation Session: 20230428230513
Implantable Pulse Generator Implant Date: 20220728

## 2022-02-20 ENCOUNTER — Ambulatory Visit: Payer: Medicare Other | Admitting: Internal Medicine

## 2022-02-28 NOTE — Progress Notes (Signed)
Carelink Summary Report / Loop Recorder 

## 2022-03-19 ENCOUNTER — Ambulatory Visit (INDEPENDENT_AMBULATORY_CARE_PROVIDER_SITE_OTHER): Payer: Medicare Other

## 2022-03-19 DIAGNOSIS — R55 Syncope and collapse: Secondary | ICD-10-CM | POA: Diagnosis not present

## 2022-03-20 LAB — CUP PACEART REMOTE DEVICE CHECK
Date Time Interrogation Session: 20230531230428
Implantable Pulse Generator Implant Date: 20220728

## 2022-03-26 ENCOUNTER — Encounter: Payer: Self-pay | Admitting: Internal Medicine

## 2022-03-26 ENCOUNTER — Ambulatory Visit
Admission: RE | Admit: 2022-03-26 | Discharge: 2022-03-26 | Disposition: A | Discharge status: Home / Self Care | Payer: Medicare Other | Source: Ambulatory Visit | Attending: Internal Medicine | Admitting: Internal Medicine

## 2022-03-26 ENCOUNTER — Ambulatory Visit (INDEPENDENT_AMBULATORY_CARE_PROVIDER_SITE_OTHER): Payer: Medicare Other | Admitting: Internal Medicine

## 2022-03-26 VITALS — BP 166/72 | HR 75 | Temp 97.9°F | Resp 16 | Ht 62.0 in | Wt 141.0 lb

## 2022-03-26 DIAGNOSIS — R519 Headache, unspecified: Secondary | ICD-10-CM | POA: Insufficient documentation

## 2022-03-26 DIAGNOSIS — G500 Trigeminal neuralgia: Secondary | ICD-10-CM

## 2022-03-26 DIAGNOSIS — E785 Hyperlipidemia, unspecified: Secondary | ICD-10-CM

## 2022-03-26 DIAGNOSIS — I10 Essential (primary) hypertension: Secondary | ICD-10-CM | POA: Diagnosis not present

## 2022-03-26 DIAGNOSIS — N1831 Chronic kidney disease, stage 3a: Secondary | ICD-10-CM

## 2022-03-26 DIAGNOSIS — A6000 Herpesviral infection of urogenital system, unspecified: Secondary | ICD-10-CM

## 2022-03-26 DIAGNOSIS — F411 Generalized anxiety disorder: Secondary | ICD-10-CM | POA: Diagnosis not present

## 2022-03-26 HISTORY — DX: Hyperlipidemia, unspecified: E78.5

## 2022-03-26 LAB — CBC WITH DIFFERENTIAL/PLATELET
Basophils Absolute: 0 10*3/uL (ref 0.0–0.1)
Basophils Relative: 0.3 % (ref 0.0–3.0)
Eosinophils Absolute: 0.1 10*3/uL (ref 0.0–0.7)
Eosinophils Relative: 1.4 % (ref 0.0–5.0)
HCT: 35 % — ABNORMAL LOW (ref 36.0–46.0)
Hemoglobin: 11.5 g/dL — ABNORMAL LOW (ref 12.0–15.0)
Lymphocytes Relative: 41.2 % (ref 12.0–46.0)
Lymphs Abs: 2.4 10*3/uL (ref 0.7–4.0)
MCHC: 32.8 g/dL (ref 30.0–36.0)
MCV: 89.9 fl (ref 78.0–100.0)
Monocytes Absolute: 0.9 10*3/uL (ref 0.1–1.0)
Monocytes Relative: 14.7 % — ABNORMAL HIGH (ref 3.0–12.0)
Neutro Abs: 2.5 10*3/uL (ref 1.4–7.7)
Neutrophils Relative %: 42.4 % — ABNORMAL LOW (ref 43.0–77.0)
Platelets: 325 10*3/uL (ref 150.0–400.0)
RBC: 3.9 Mil/uL (ref 3.87–5.11)
RDW: 13.7 % (ref 11.5–15.5)
WBC: 5.9 10*3/uL (ref 4.0–10.5)

## 2022-03-26 LAB — LIPID PANEL
Cholesterol: 223 mg/dL — ABNORMAL HIGH (ref 0–200)
HDL: 102 mg/dL (ref >39.00)
LDL Cholesterol: 100 mg/dL — ABNORMAL HIGH (ref 0–99)
NonHDL: 121.16
Total CHOL/HDL Ratio: 2
Triglycerides: 105 mg/dL (ref 0.0–149.0)
VLDL: 21 mg/dL (ref 0.0–40.0)

## 2022-03-26 LAB — SEDIMENTATION RATE: Sed Rate: 26 mm/hr (ref 0–30)

## 2022-03-26 MED ORDER — ACYCLOVIR 400 MG PO TABS: 400.0000 mg | ORAL_TABLET | Freq: Every day | ORAL | 0 refills | Status: DC

## 2022-03-26 MED ORDER — CARBAMAZEPINE ER 300 MG PO CP12: 300.0000 mg | ORAL_CAPSULE | Freq: Every day | ORAL | 0 refills | Status: DC

## 2022-03-26 NOTE — Progress Notes (Unsigned)
Subjective:  Patient ID: Debbie Hatfield, female    DOB: 1955-10-20  Age: 66 y.o. MRN: [id number]  CC: Hypertension and Headache   HPI Debbie Hatfield presents for f/up -   She complains of a 1 week history of right frontal headache.  She has blurred vision but believes this is caused by Plaquenil.  She describes the headache as an achy sensation.  She denies nausea, vomiting, or paresthesias.  She is using methotrexate and leucovorin for lupus.  Outpatient Medications Prior to Visit  Medication Sig Dispense Refill   esomeprazole (NEXIUM) 40 MG capsule Take 40 mg by mouth Nightly.     gabapentin (NEURONTIN) 300 MG capsule Take 600 mg by mouth 3 (three) times daily.     hydrOXYzine (ATARAX) 10 MG tablet Take 1 tablet (10 mg total) by mouth every 8 (eight) hours as needed. 270 tablet 0   leucovorin (WELLCOVORIN) 5 MG tablet Take 5 mg by mouth once a week.     telmisartan-hydrochlorothiazide (MICARDIS HCT) 40-12.5 MG tablet TAKE 1 TABLET BY MOUTH DAILY 90 tablet 0   tizanidine (ZANAFLEX) 2 MG capsule Take 1 capsule by mouth at bedtime.     traMADol (ULTRAM) 50 MG tablet Take 50 mg by mouth every 6 (six) hours as needed.     venlafaxine XR (EFFEXOR XR) 37.5 MG 24 hr capsule Take 2 capsules (75 mg total) by mouth daily with breakfast. 180 capsule 1   acyclovir (ZOVIRAX) 800 MG tablet Take 800 mg by mouth once.     carbamazepine (CARBATROL) 300 MG 12 hr capsule Take 300 mg by mouth daily.      No facility-administered medications prior to visit.    ROS Review of Systems  Constitutional: Negative.  Negative for chills, diaphoresis, fatigue and fever.  HENT: Negative.  Negative for trouble swallowing.   Eyes:  Positive for visual disturbance. Negative for photophobia and pain.  Respiratory: Negative.  Negative for cough, chest tightness, shortness of breath and wheezing.   Cardiovascular:  Negative for chest pain, palpitations and leg swelling.  Gastrointestinal:  Negative for abdominal  pain, diarrhea, nausea and vomiting.  Genitourinary:  Negative for difficulty urinating.  Musculoskeletal: Negative.  Negative for back pain and neck pain.  Skin: Negative.   Neurological:  Positive for headaches. Negative for dizziness, tremors, syncope, speech difficulty, weakness, light-headedness and numbness.  Hematological:  Negative for adenopathy. Does not bruise/bleed easily.  Psychiatric/Behavioral: Negative.      Objective:  BP (!) 166/72 (BP Location: Left Arm, Patient Position: Sitting, Cuff Size: Large)   Pulse 75   Temp 97.9 F (36.6 C) (Oral)   Resp 16   Ht 5\' 2"  (1.575 m)   Wt 141 lb (64 kg)   SpO2 99%   BMI 25.79 kg/m   BP Readings from Last 3 Encounters:  03/26/22 (!) 166/72  01/02/22 126/76  11/15/21 132/70    Wt Readings from Last 3 Encounters:  03/26/22 141 lb (64 kg)  01/02/22 138 lb 6.4 oz (62.8 kg)  11/15/21 139 lb (63 kg)    Physical Exam Vitals reviewed.  HENT:     Nose: Nose normal.     Mouth/Throat:     Mouth: Mucous membranes are moist.  Eyes:     General: No scleral icterus.    Extraocular Movements: Extraocular movements intact.     Conjunctiva/sclera: Conjunctivae normal.     Pupils: Pupils are equal, round, and reactive to light.  Cardiovascular:     Rate and  Rhythm: Normal rate and regular rhythm.     Heart sounds: Normal heart sounds, S1 normal and S2 normal. No murmur heard.    Comments: EKG- NSR, 66 bpm Normal EKG Pulmonary:     Effort: Pulmonary effort is normal.     Breath sounds: No stridor. No wheezing, rhonchi or rales.  Abdominal:     General: Abdomen is flat.     Palpations: There is no mass.     Tenderness: There is no abdominal tenderness. There is no guarding.     Hernia: No hernia is present.  Musculoskeletal:     Cervical back: Neck supple.     Right lower leg: No edema.     Left lower leg: No edema.  Lymphadenopathy:     Cervical: No cervical adenopathy.  Skin:    General: Skin is warm and dry.      Findings: No rash.  Neurological:     General: No focal deficit present.     Mental Status: She is alert and oriented to person, place, and time. Mental status is at baseline.     Deep Tendon Reflexes: Reflexes normal.  Psychiatric:        Mood and Affect: Mood normal.        Behavior: Behavior normal.     Lab Results  Component Value Date   WBC 5.9 03/26/2022   HGB 11.5 (L) 03/26/2022   HCT 35.0 (L) 03/26/2022   PLT 325.0 03/26/2022   GLUCOSE 94 11/15/2021   CHOL 223 (H) 03/26/2022   TRIG 105.0 03/26/2022   HDL 102.00 03/26/2022   LDLCALC 100 (H) 03/26/2022   NA 143 11/15/2021   K 4.3 11/15/2021   CL 105 11/15/2021   CREATININE 1.06 11/15/2021   BUN 19 11/15/2021   CO2 30 11/15/2021   TSH 1.45 11/15/2021    MM 3D SCREEN BREAST BILATERAL  Result Date: 12/26/2021 CLINICAL DATA:  Screening. EXAM: DIGITAL SCREENING BILATERAL MAMMOGRAM WITH TOMOSYNTHESIS AND CAD TECHNIQUE: Bilateral screening digital craniocaudal and mediolateral oblique mammograms were obtained. Bilateral screening digital breast tomosynthesis was performed. The images were evaluated with computer-aided detection. COMPARISON:  None. ACR Breast Density Category b: There are scattered areas of fibroglandular density. FINDINGS: There are no findings suspicious for malignancy. IMPRESSION: No mammographic evidence of malignancy. A result letter of this screening mammogram will be mailed directly to the patient. RECOMMENDATION: Screening mammogram in one year. (Code:SM-B-01Y) BI-RADS CATEGORY  1: Negative. Electronically Signed   By: Everlean Alstrom M.D.   On: 12/26/2021 08:44   CT HEAD WO CONTRAST (5MM)  Result Date: 03/26/2022 CLINICAL DATA:  Headache, new or worsening (Age >= 50y) EXAM: CT HEAD WITHOUT CONTRAST TECHNIQUE: Contiguous axial images were obtained from the base of the skull through the vertex without intravenous contrast. RADIATION DOSE REDUCTION: This exam was performed according to the departmental  dose-optimization program which includes automated exposure control, adjustment of the mA and/or kV according to patient size and/or use of iterative reconstruction technique. COMPARISON:  CT head January 07, 2021. FINDINGS: Brain: No evidence of acute infarction, hemorrhage, hydrocephalus, extra-axial collection or mass lesion/mass effect. Vascular: No hyperdense vessel identified. Calcific intracranial atherosclerosis. Skull: No acute fracture. Sinuses/Orbits: Clear sinuses.  No acute orbital finding. Other: No mastoid effusions. IMPRESSION: No evidence of acute intracranial abnormality. Electronically Signed   By: Margaretha Sheffield M.D.   On: 03/26/2022 14:03      Assessment & Plan:   Debbie Hatfield was seen today for hypertension and headache.  Diagnoses and  all orders for this visit:  Primary hypertension- This may be contributing to her headaches.  I have asked her to add amlodipine to the current antihypertensives. -     Cancel: EKG 12-Lead -     CBC with Differential/Platelet; Future -     EKG 12-Lead -     CBC with Differential/Platelet -     amLODipine (NORVASC) 5 MG tablet; Take 1 tablet (5 mg total) by mouth daily.  Stage 3a chronic kidney disease ([place])- Her renal function is stable.  Will try to get better control of her blood pressure. -     CBC with Differential/Platelet; Future -     CBC with Differential/Platelet  GAD (generalized anxiety disorder)  Severe frontal headaches- Exam, CT scan, and labs are reassuring. -     CT HEAD WO CONTRAST (5MM); Future -     CBC with Differential/Platelet; Future -     Sedimentation rate; Future -     Sedimentation rate -     CBC with Differential/Platelet  Hyperlipidemia LDL goal <100- Her HDL is over 100.  Statin therapy is not indicated. -     Lipid panel; Future -     Lipid panel  Recurrent genital herpes -     acyclovir (ZOVIRAX) 400 MG tablet; Take 1 tablet (400 mg total) by mouth daily.  Trigeminal neuralgia -     carbamazepine  (CARBATROL) 300 MG 12 hr capsule; Take 1 capsule (300 mg total) by mouth daily.   I have discontinued Debbie Hatfield. Debbie Hatfield acyclovir. I have also changed her carbamazepine. Additionally, I am having her start on acyclovir and amLODipine. Lastly, I am having her maintain her esomeprazole, traMADol, gabapentin, tizanidine, venlafaxine XR, hydrOXYzine, telmisartan-hydrochlorothiazide, and leucovorin.  Meds ordered this encounter  Medications   acyclovir (ZOVIRAX) 400 MG tablet    Sig: Take 1 tablet (400 mg total) by mouth daily.    Dispense:  90 tablet    Refill:  0   carbamazepine (CARBATROL) 300 MG 12 hr capsule    Sig: Take 1 capsule (300 mg total) by mouth daily.    Dispense:  90 capsule    Refill:  0   amLODipine (NORVASC) 5 MG tablet    Sig: Take 1 tablet (5 mg total) by mouth daily.    Dispense:  90 tablet    Refill:  0   I spent 50 minutes in preparing to see the patient by review of recent labs, obtaining and reviewing separately obtained history, communicating with the patient , ordering medications, an EKG and CT scan - discussing the results with her. Documenting clinical information in the EHR including the differential Dx, treatment, and any further evaluation and management of multiple complex medical issues.     Follow-up: Return in about 3 weeks (around 04/16/2022).  Scarlette Calico, MD

## 2022-03-26 NOTE — Patient Instructions (Signed)
Hypertension, Adult High blood pressure (hypertension) is when the force of blood pumping through the arteries is too strong. The arteries are the blood vessels that carry blood from the heart throughout the body. Hypertension forces the heart to work harder to pump blood and may cause arteries to become narrow or stiff. Untreated or uncontrolled hypertension can lead to a heart attack, heart failure, a stroke, kidney disease, and other problems. A blood pressure reading consists of a higher number over a lower number. Ideally, your blood pressure should be below 120/80. The first ("top") number is called the systolic pressure. It is a measure of the pressure in your arteries as your heart beats. The second ("bottom") number is called the diastolic pressure. It is a measure of the pressure in your arteries as the heart relaxes. What are the causes? The exact cause of this condition is not known. There are some conditions that result in high blood pressure. What increases the risk? Certain factors may make you more likely to develop high blood pressure. Some of these risk factors are under your control, including: Smoking. Not getting enough exercise or physical activity. Being overweight. Having too much fat, sugar, calories, or salt (sodium) in your diet. Drinking too much alcohol. Other risk factors include: Having a personal history of heart disease, diabetes, high cholesterol, or kidney disease. Stress. Having a family history of high blood pressure and high cholesterol. Having obstructive sleep apnea. Age. The risk increases with age. What are the signs or symptoms? High blood pressure may not cause symptoms. Very high blood pressure (hypertensive crisis) may cause: Headache. Fast or irregular heartbeats (palpitations). Shortness of breath. Nosebleed. Nausea and vomiting. Vision changes. Severe chest pain, dizziness, and seizures. How is this diagnosed? This condition is diagnosed by  measuring your blood pressure while you are seated, with your arm resting on a flat surface, your legs uncrossed, and your feet flat on the floor. The cuff of the blood pressure monitor will be placed directly against the skin of your upper arm at the level of your heart. Blood pressure should be measured at least twice using the same arm. Certain conditions can cause a difference in blood pressure between your right and left arms. If you have a high blood pressure reading during one visit or you have normal blood pressure with other risk factors, you may be asked to: Return on a different day to have your blood pressure checked again. Monitor your blood pressure at home for 1 week or longer. If you are diagnosed with hypertension, you may have other blood or imaging tests to help your health care provider understand your overall risk for other conditions. How is this treated? This condition is treated by making healthy lifestyle changes, such as eating healthy foods, exercising more, and reducing your alcohol intake. You may be referred for counseling on a healthy diet and physical activity. Your health care provider may prescribe medicine if lifestyle changes are not enough to get your blood pressure under control and if: Your systolic blood pressure is above 130. Your diastolic blood pressure is above 80. Your personal target blood pressure may vary depending on your medical conditions, your age, and other factors. Follow these instructions at home: Eating and drinking  Eat a diet that is high in fiber and potassium, and low in sodium, added sugar, and fat. An example of this eating plan is called the DASH diet. DASH stands for Dietary Approaches to Stop Hypertension. To eat this way: Eat   plenty of fresh fruits and vegetables. Try to fill one half of your plate at each meal with fruits and vegetables. Eat whole grains, such as whole-wheat pasta, brown rice, or whole-grain bread. Fill about one  fourth of your plate with whole grains. Eat or drink low-fat dairy products, such as skim milk or low-fat yogurt. Avoid fatty cuts of meat, processed or cured meats, and poultry with skin. Fill about one fourth of your plate with lean proteins, such as fish, chicken without skin, beans, eggs, or tofu. Avoid pre-made and processed foods. These tend to be higher in sodium, added sugar, and fat. Reduce your daily sodium intake. Many people with hypertension should eat less than 1,500 mg of sodium a day. Do not drink alcohol if: Your health care provider tells you not to drink. You are pregnant, may be pregnant, or are planning to become pregnant. If you drink alcohol: Limit how much you have to: 0-1 drink a day for women. 0-2 drinks a day for men. Know how much alcohol is in your drink. In the [place] one drink equals one 12 oz bottle of beer (355 mL), one 5 oz glass of wine (148 mL), or one 1 oz glass of hard liquor (44 mL). Lifestyle  Work with your health care provider to maintain a healthy body weight or to lose weight. Ask what an ideal weight is for you. Get at least 30 minutes of exercise that causes your heart to beat faster (aerobic exercise) most days of the week. Activities may include walking, swimming, or biking. Include exercise to strengthen your muscles (resistance exercise), such as Pilates or lifting weights, as part of your weekly exercise routine. Try to do these types of exercises for 30 minutes at least 3 days a week. Do not use any products that contain nicotine or tobacco. These products include cigarettes, chewing tobacco, and vaping devices, such as e-cigarettes. If you need help quitting, ask your health care provider. Monitor your blood pressure at home as told by your health care provider. Keep all follow-up visits. This is important. Medicines Take over-the-counter and prescription medicines only as told by your health care provider. Follow directions carefully. Blood  pressure medicines must be taken as prescribed. Do not skip doses of blood pressure medicine. Doing this puts you at risk for problems and can make the medicine less effective. Ask your health care provider about side effects or reactions to medicines that you should watch for. Contact a health care provider if you: Think you are having a reaction to a medicine you are taking. Have headaches that keep coming back (recurring). Feel dizzy. Have swelling in your ankles. Have trouble with your vision. Get help right away if you: Develop a severe headache or confusion. Have unusual weakness or numbness. Feel faint. Have severe pain in your chest or abdomen. Vomit repeatedly. Have trouble breathing. These symptoms may be an emergency. Get help right away. Call 911. Do not wait to see if the symptoms will go away. Do not drive yourself to the hospital. Summary Hypertension is when the force of blood pumping through your arteries is too strong. If this condition is not controlled, it may put you at risk for serious complications. Your personal target blood pressure may vary depending on your medical conditions, your age, and other factors. For most people, a normal blood pressure is less than 120/80. Hypertension is treated with lifestyle changes, medicines, or a combination of both. Lifestyle changes include losing weight, eating a healthy,   low-sodium diet, exercising more, and limiting alcohol. This information is not intended to replace advice given to you by your health care provider. Make sure you discuss any questions you have with your health care provider. Document Revised: 08/08/2021 Document Reviewed: 08/08/2021 Elsevier Patient Education  2023 [employer]  

## 2022-03-27 ENCOUNTER — Encounter: Payer: Self-pay | Admitting: Internal Medicine

## 2022-03-27 MED ORDER — AMLODIPINE BESYLATE 5 MG PO TABS: 5.0000 mg | ORAL_TABLET | Freq: Every day | ORAL | 0 refills | Status: DC

## 2022-04-03 NOTE — Progress Notes (Signed)
Carelink Summary Report / Loop Recorder 

## 2022-04-20 LAB — CUP PACEART REMOTE DEVICE CHECK
Date Time Interrogation Session: 20230703230405
Implantable Pulse Generator Implant Date: 20220728

## 2022-04-23 ENCOUNTER — Ambulatory Visit (INDEPENDENT_AMBULATORY_CARE_PROVIDER_SITE_OTHER): Payer: Medicare Other

## 2022-04-23 DIAGNOSIS — R55 Syncope and collapse: Secondary | ICD-10-CM | POA: Diagnosis not present

## 2022-04-24 ENCOUNTER — Other Ambulatory Visit: Payer: Self-pay | Admitting: Internal Medicine

## 2022-04-24 DIAGNOSIS — I10 Essential (primary) hypertension: Secondary | ICD-10-CM

## 2022-04-30 ENCOUNTER — Ambulatory Visit (INDEPENDENT_AMBULATORY_CARE_PROVIDER_SITE_OTHER): Payer: Medicare Other | Admitting: Internal Medicine

## 2022-04-30 ENCOUNTER — Encounter: Payer: Self-pay | Admitting: Internal Medicine

## 2022-04-30 VITALS — BP 134/76 | HR 61 | Temp 98.4°F | Ht 62.0 in | Wt 143.0 lb

## 2022-04-30 DIAGNOSIS — I10 Essential (primary) hypertension: Secondary | ICD-10-CM

## 2022-04-30 DIAGNOSIS — K635 Polyp of colon: Secondary | ICD-10-CM | POA: Diagnosis not present

## 2022-04-30 DIAGNOSIS — D638 Anemia in other chronic diseases classified elsewhere: Secondary | ICD-10-CM | POA: Diagnosis not present

## 2022-04-30 DIAGNOSIS — Z23 Encounter for immunization: Secondary | ICD-10-CM

## 2022-04-30 NOTE — Progress Notes (Signed)
Subjective:  Patient ID: Debbie Hatfield, female    DOB: [birth year]  Age: 66 y.o. MRN: [id number]  CC: Anemia and Hypertension   HPI Debbie Hatfield presents for f/up -  She is active and denies chest pain, shortness of breath, diaphoresis, edema, or fatigue.  Outpatient Medications Prior to Visit  Medication Sig Dispense Refill   acyclovir (ZOVIRAX) 400 MG tablet Take 1 tablet (400 mg total) by mouth daily. 90 tablet 0   amLODipine (NORVASC) 5 MG tablet Take 1 tablet (5 mg total) by mouth daily. 90 tablet 0   carbamazepine (CARBATROL) 300 MG 12 hr capsule Take 1 capsule (300 mg total) by mouth daily. 90 capsule 0   esomeprazole (NEXIUM) 40 MG capsule Take 40 mg by mouth Nightly.     gabapentin (NEURONTIN) 300 MG capsule Take 600 mg by mouth 3 (three) times daily.     hydrOXYzine (ATARAX) 10 MG tablet Take 1 tablet (10 mg total) by mouth every 8 (eight) hours as needed. 270 tablet 0   leucovorin (WELLCOVORIN) 5 MG tablet Take 5 mg by mouth once a week.     telmisartan-hydrochlorothiazide (MICARDIS HCT) 40-12.5 MG tablet TAKE 1 TABLET BY MOUTH DAILY 90 tablet 0   tizanidine (ZANAFLEX) 2 MG capsule Take 1 capsule by mouth at bedtime.     traMADol (ULTRAM) 50 MG tablet Take 50 mg by mouth every 6 (six) hours as needed.     venlafaxine XR (EFFEXOR XR) 37.5 MG 24 hr capsule Take 2 capsules (75 mg total) by mouth daily with breakfast. 180 capsule 1   No facility-administered medications prior to visit.    ROS Review of Systems  Constitutional: Negative.  Negative for diaphoresis and fatigue.  HENT: Negative.    Eyes: Negative.   Respiratory:  Negative for chest tightness, shortness of breath and wheezing.   Cardiovascular:  Negative for chest pain, palpitations and leg swelling.  Gastrointestinal:  Negative for abdominal pain, constipation, diarrhea, nausea and vomiting.  Endocrine: Negative.   Genitourinary: Negative.  Negative for difficulty urinating.  Musculoskeletal:  Positive for  arthralgias. Negative for joint swelling.  Skin: Negative.  Negative for color change and pallor.  Neurological: Negative.  Negative for dizziness, weakness, light-headedness and headaches.  Hematological:  Negative for adenopathy. Does not bruise/bleed easily.  Psychiatric/Behavioral: Negative.      Objective:  BP 134/76 (BP Location: Left Arm, Patient Position: Sitting, Cuff Size: Large)   Pulse 61   Temp 98.4 F (36.9 C) (Oral)   Ht 5\' 2"  (1.575 m)   Wt 143 lb (64.9 kg)   SpO2 98%   BMI 26.16 kg/m   BP Readings from Last 3 Encounters:  04/30/22 134/76  03/26/22 (!) 166/72  01/02/22 126/76    Wt Readings from Last 3 Encounters:  04/30/22 143 lb (64.9 kg)  03/26/22 141 lb (64 kg)  01/02/22 138 lb 6.4 oz (62.8 kg)    Physical Exam  Lab Results  Component Value Date   WBC 5.9 03/26/2022   HGB 11.5 (L) 03/26/2022   HCT 35.0 (L) 03/26/2022   PLT 325.0 03/26/2022   GLUCOSE 94 11/15/2021   CHOL 223 (H) 03/26/2022   TRIG 105.0 03/26/2022   HDL 102.00 03/26/2022   LDLCALC 100 (H) 03/26/2022   NA 143 11/15/2021   K 4.3 11/15/2021   CL 105 11/15/2021   CREATININE 1.06 11/15/2021   BUN 19 11/15/2021   CO2 30 11/15/2021   TSH 1.45 11/15/2021    CT HEAD  WO CONTRAST ( )  Result Date: 03/26/2022 CLINICAL DATA:  Headache, new or worsening (Age >= 50y) EXAM: CT HEAD WITHOUT CONTRAST TECHNIQUE: Contiguous axial images were obtained from the base of the skull through the vertex without intravenous contrast. RADIATION DOSE REDUCTION: This exam was performed according to the departmental dose-optimization program which includes automated exposure control, adjustment of the mA and/or kV according to patient size and/or use of iterative reconstruction technique. COMPARISON:  CT head January 07, 2021. FINDINGS: Brain: No evidence of acute infarction, hemorrhage, hydrocephalus, extra-axial collection or mass lesion/mass effect. Vascular: No hyperdense vessel identified. Calcific  intracranial atherosclerosis. Skull: No acute fracture. Sinuses/Orbits: Clear sinuses.  No acute orbital finding. Other: No mastoid effusions. IMPRESSION: No evidence of acute intracranial abnormality. Electronically Signed   By: Feliberto Harts M.D.   On: 03/26/2022 14:03    Assessment & Plan:   Debbie Hatfield was seen today for anemia and hypertension.  Diagnoses and all orders for this visit:  Polyp of colon, unspecified part of colon, unspecified type -     Ambulatory referral to Gastroenterology  Need for shingles vaccine -     Zoster Vaccine Adjuvanted [facility]) injection; Inject 0.5 mLs into the muscle once for 1 dose.  Primary hypertension-her blood pressure is adequately well controlled.  Anemia, chronic disease-her H&H are stable.   I am having Debbie Hatfield start on Shingrix. I am also having her maintain her esomeprazole, traMADol, gabapentin, tizanidine, venlafaxine XR, hydrOXYzine, leucovorin, acyclovir, carbamazepine, amLODipine, and telmisartan-hydrochlorothiazide.  Meds ordered this encounter  Medications   Zoster Vaccine Adjuvanted [facility]) injection    Sig: Inject 0.5 mLs into the muscle once for 1 dose.    Dispense:  0.5 mL    Refill:  1     Follow-up: Return in about 6 months (around 10/31/2022).  [name] Linger, MD

## 2022-04-30 NOTE — Patient Instructions (Signed)
Hypertension, Adult High blood pressure (hypertension) is when the force of blood pumping through the arteries is too strong. The arteries are the blood vessels that carry blood from the heart throughout the body. Hypertension forces the heart to work harder to pump blood and may cause arteries to become narrow or stiff. Untreated or uncontrolled hypertension can lead to a heart attack, heart failure, a stroke, kidney disease, and other problems. A blood pressure reading consists of a higher number over a lower number. Ideally, your blood pressure should be below 120/80. The first ("top") number is called the systolic pressure. It is a measure of the pressure in your arteries as your heart beats. The second ("bottom") number is called the diastolic pressure. It is a measure of the pressure in your arteries as the heart relaxes. What are the causes? The exact cause of this condition is not known. There are some conditions that result in high blood pressure. What increases the risk? Certain factors may make you more likely to develop high blood pressure. Some of these risk factors are under your control, including: Smoking. Not getting enough exercise or physical activity. Being overweight. Having too much fat, sugar, calories, or salt (sodium) in your diet. Drinking too much alcohol. Other risk factors include: Having a personal history of heart disease, diabetes, high cholesterol, or kidney disease. Stress. Having a family history of high blood pressure and high cholesterol. Having obstructive sleep apnea. Age. The risk increases with age. What are the signs or symptoms? High blood pressure may not cause symptoms. Very high blood pressure (hypertensive crisis) may cause: Headache. Fast or irregular heartbeats (palpitations). Shortness of breath. Nosebleed. Nausea and vomiting. Vision changes. Severe chest pain, dizziness, and seizures. How is this diagnosed? This condition is diagnosed by  measuring your blood pressure while you are seated, with your arm resting on a flat surface, your legs uncrossed, and your feet flat on the floor. The cuff of the blood pressure monitor will be placed directly against the skin of your upper arm at the level of your heart. Blood pressure should be measured at least twice using the same arm. Certain conditions can cause a difference in blood pressure between your right and left arms. If you have a high blood pressure reading during one visit or you have normal blood pressure with other risk factors, you may be asked to: Return on a different day to have your blood pressure checked again. Monitor your blood pressure at home for 1 week or longer. If you are diagnosed with hypertension, you may have other blood or imaging tests to help your health care provider understand your overall risk for other conditions. How is this treated? This condition is treated by making healthy lifestyle changes, such as eating healthy foods, exercising more, and reducing your alcohol intake. You may be referred for counseling on a healthy diet and physical activity. Your health care provider may prescribe medicine if lifestyle changes are not enough to get your blood pressure under control and if: Your systolic blood pressure is above 130. Your diastolic blood pressure is above 80. Your personal target blood pressure may vary depending on your medical conditions, your age, and other factors. Follow these instructions at home: Eating and drinking  Eat a diet that is high in fiber and potassium, and low in sodium, added sugar, and fat. An example of this eating plan is called the DASH diet. DASH stands for Dietary Approaches to Stop Hypertension. To eat this way: Eat   plenty of fresh fruits and vegetables. Try to fill one half of your plate at each meal with fruits and vegetables. Eat whole grains, such as whole-wheat pasta, brown rice, or whole-grain bread. Fill about one  fourth of your plate with whole grains. Eat or drink low-fat dairy products, such as skim milk or low-fat yogurt. Avoid fatty cuts of meat, processed or cured meats, and poultry with skin. Fill about one fourth of your plate with lean proteins, such as fish, chicken without skin, beans, eggs, or tofu. Avoid pre-made and processed foods. These tend to be higher in sodium, added sugar, and fat. Reduce your daily sodium intake. Many people with hypertension should eat less than 1,500 mg of sodium a day. Do not drink alcohol if: Your health care provider tells you not to drink. You are pregnant, may be pregnant, or are planning to become pregnant. If you drink alcohol: Limit how much you have to: 0-1 drink a day for women. 0-2 drinks a day for men. Know how much alcohol is in your drink. In the [place] one drink equals one 12 oz bottle of beer (355 mL), one 5 oz glass of wine (148 mL), or one 1 oz glass of hard liquor (44 mL). Lifestyle  Work with your health care provider to maintain a healthy body weight or to lose weight. Ask what an ideal weight is for you. Get at least 30 minutes of exercise that causes your heart to beat faster (aerobic exercise) most days of the week. Activities may include walking, swimming, or biking. Include exercise to strengthen your muscles (resistance exercise), such as Pilates or lifting weights, as part of your weekly exercise routine. Try to do these types of exercises for 30 minutes at least 3 days a week. Do not use any products that contain nicotine or tobacco. These products include cigarettes, chewing tobacco, and vaping devices, such as e-cigarettes. If you need help quitting, ask your health care provider. Monitor your blood pressure at home as told by your health care provider. Keep all follow-up visits. This is important. Medicines Take over-the-counter and prescription medicines only as told by your health care provider. Follow directions carefully. Blood  pressure medicines must be taken as prescribed. Do not skip doses of blood pressure medicine. Doing this puts you at risk for problems and can make the medicine less effective. Ask your health care provider about side effects or reactions to medicines that you should watch for. Contact a health care provider if you: Think you are having a reaction to a medicine you are taking. Have headaches that keep coming back (recurring). Feel dizzy. Have swelling in your ankles. Have trouble with your vision. Get help right away if you: Develop a severe headache or confusion. Have unusual weakness or numbness. Feel faint. Have severe pain in your chest or abdomen. Vomit repeatedly. Have trouble breathing. These symptoms may be an emergency. Get help right away. Call 911. Do not wait to see if the symptoms will go away. Do not drive yourself to the hospital. Summary Hypertension is when the force of blood pumping through your arteries is too strong. If this condition is not controlled, it may put you at risk for serious complications. Your personal target blood pressure may vary depending on your medical conditions, your age, and other factors. For most people, a normal blood pressure is less than 120/80. Hypertension is treated with lifestyle changes, medicines, or a combination of both. Lifestyle changes include losing weight, eating a healthy,   low-sodium diet, exercising more, and limiting alcohol. This information is not intended to replace advice given to you by your health care provider. Make sure you discuss any questions you have with your health care provider. Document Revised: 08/08/2021 Document Reviewed: 08/08/2021 Elsevier Patient Education  2023 [employer]  

## 2022-05-06 DIAGNOSIS — D638 Anemia in other chronic diseases classified elsewhere: Secondary | ICD-10-CM | POA: Insufficient documentation

## 2022-05-06 MED ORDER — SHINGRIX 50 MCG/0.5ML IM SUSR: 0.5000 mL | Freq: Once | INTRAMUSCULAR | 1 refills | Status: AC

## 2022-05-07 ENCOUNTER — Emergency Department (HOSPITAL_COMMUNITY): Payer: Medicare Other

## 2022-05-07 ENCOUNTER — Inpatient Hospital Stay (HOSPITAL_COMMUNITY): Payer: Medicare Other

## 2022-05-07 ENCOUNTER — Other Ambulatory Visit: Payer: Self-pay

## 2022-05-07 ENCOUNTER — Inpatient Hospital Stay (HOSPITAL_COMMUNITY)
Admission: EM | Admit: 2022-05-07 | Discharge: 2022-05-08 | DRG: 063 | Disposition: A | Discharge status: Home / Self Care | Payer: Medicare Other | Attending: Neurology | Admitting: Neurology

## 2022-05-07 ENCOUNTER — Encounter (HOSPITAL_COMMUNITY): Payer: Self-pay | Admitting: *Deleted

## 2022-05-07 ENCOUNTER — Encounter (INDEPENDENT_AMBULATORY_CARE_PROVIDER_SITE_OTHER): Payer: Self-pay | Admitting: *Deleted

## 2022-05-07 DIAGNOSIS — I6350 Cerebral infarction due to unspecified occlusion or stenosis of unspecified cerebral artery: Secondary | ICD-10-CM

## 2022-05-07 DIAGNOSIS — Z882 Allergy status to sulfonamides status: Secondary | ICD-10-CM | POA: Diagnosis not present

## 2022-05-07 DIAGNOSIS — I10 Essential (primary) hypertension: Secondary | ICD-10-CM | POA: Diagnosis present

## 2022-05-07 DIAGNOSIS — Z888 Allergy status to other drugs, medicaments and biological substances status: Secondary | ICD-10-CM | POA: Diagnosis not present

## 2022-05-07 DIAGNOSIS — Z20822 Contact with and (suspected) exposure to covid-19: Secondary | ICD-10-CM | POA: Diagnosis present

## 2022-05-07 DIAGNOSIS — I63311 Cerebral infarction due to thrombosis of right middle cerebral artery: Secondary | ICD-10-CM | POA: Diagnosis not present

## 2022-05-07 DIAGNOSIS — K589 Irritable bowel syndrome without diarrhea: Secondary | ICD-10-CM | POA: Diagnosis present

## 2022-05-07 DIAGNOSIS — G500 Trigeminal neuralgia: Secondary | ICD-10-CM | POA: Diagnosis present

## 2022-05-07 DIAGNOSIS — M329 Systemic lupus erythematosus, unspecified: Secondary | ICD-10-CM | POA: Diagnosis present

## 2022-05-07 DIAGNOSIS — R471 Dysarthria and anarthria: Secondary | ICD-10-CM | POA: Diagnosis present

## 2022-05-07 DIAGNOSIS — I6389 Other cerebral infarction: Secondary | ICD-10-CM

## 2022-05-07 DIAGNOSIS — I639 Cerebral infarction, unspecified: Secondary | ICD-10-CM | POA: Diagnosis present

## 2022-05-07 DIAGNOSIS — Z79899 Other long term (current) drug therapy: Secondary | ICD-10-CM | POA: Diagnosis not present

## 2022-05-07 DIAGNOSIS — K219 Gastro-esophageal reflux disease without esophagitis: Secondary | ICD-10-CM | POA: Diagnosis present

## 2022-05-07 DIAGNOSIS — I69392 Facial weakness following cerebral infarction: Secondary | ICD-10-CM

## 2022-05-07 DIAGNOSIS — K3184 Gastroparesis: Secondary | ICD-10-CM | POA: Diagnosis present

## 2022-05-07 DIAGNOSIS — R29703 NIHSS score 3: Secondary | ICD-10-CM | POA: Diagnosis present

## 2022-05-07 DIAGNOSIS — Z8249 Family history of ischemic heart disease and other diseases of the circulatory system: Secondary | ICD-10-CM

## 2022-05-07 DIAGNOSIS — I6381 Other cerebral infarction due to occlusion or stenosis of small artery: Secondary | ICD-10-CM | POA: Diagnosis present

## 2022-05-07 DIAGNOSIS — Z880 Allergy status to penicillin: Secondary | ICD-10-CM

## 2022-05-07 HISTORY — DX: Cerebral infarction, unspecified: I63.9

## 2022-05-07 LAB — DIFFERENTIAL
Abs Immature Granulocytes: 0.02 10*3/uL (ref 0.00–0.07)
Basophils Absolute: 0 10*3/uL (ref 0.0–0.1)
Basophils Relative: 0 %
Eosinophils Absolute: 0.1 10*3/uL (ref 0.0–0.5)
Eosinophils Relative: 2 %
Immature Granulocytes: 0 %
Lymphocytes Relative: 42 %
Lymphs Abs: 2.5 10*3/uL (ref 0.7–4.0)
Monocytes Absolute: 1.2 10*3/uL — ABNORMAL HIGH (ref 0.1–1.0)
Monocytes Relative: 20 %
Neutro Abs: 2.2 10*3/uL (ref 1.7–7.7)
Neutrophils Relative %: 36 %

## 2022-05-07 LAB — ECHOCARDIOGRAM COMPLETE
Area-P 1/2: 3.31 cm2
Calc EF: 60.6 %
Height: 62 in
S' Lateral: 2.2 cm
Single Plane A2C EF: 62.9 %
Single Plane A4C EF: 60.2 %
Weight: 2321 oz

## 2022-05-07 LAB — URINALYSIS, ROUTINE W REFLEX MICROSCOPIC
Bilirubin Urine: NEGATIVE
Glucose, UA: NEGATIVE mg/dL
Hgb urine dipstick: NEGATIVE
Ketones, ur: NEGATIVE mg/dL
Leukocytes,Ua: NEGATIVE
Nitrite: NEGATIVE
Protein, ur: NEGATIVE mg/dL
Specific Gravity, Urine: 1.029 (ref 1.005–1.030)
pH: 6 (ref 5.0–8.0)

## 2022-05-07 LAB — COMPREHENSIVE METABOLIC PANEL
ALT: 18 U/L (ref 0–44)
AST: 20 U/L (ref 15–41)
Albumin: 3.9 g/dL (ref 3.5–5.0)
Alkaline Phosphatase: 68 U/L (ref 38–126)
Anion gap: 10 (ref 5–15)
BUN: 19 mg/dL (ref 8–23)
CO2: 26 mmol/L (ref 22–32)
Calcium: 9.3 mg/dL (ref 8.9–10.3)
Chloride: 102 mmol/L (ref 98–111)
Creatinine, Ser: 1.14 mg/dL — ABNORMAL HIGH (ref 0.44–1.00)
GFR, Estimated: 53 mL/min — ABNORMAL LOW (ref >60)
Glucose, Bld: 80 mg/dL (ref 70–99)
Potassium: 4.4 mmol/L (ref 3.5–5.1)
Sodium: 138 mmol/L (ref 135–145)
Total Bilirubin: 0.7 mg/dL (ref 0.3–1.2)
Total Protein: 6.6 g/dL (ref 6.5–8.1)

## 2022-05-07 LAB — I-STAT CHEM 8, ED
BUN: 22 mg/dL (ref 8–23)
Calcium, Ion: 1.09 mmol/L — ABNORMAL LOW (ref 1.15–1.40)
Chloride: 102 mmol/L (ref 98–111)
Creatinine, Ser: 1.1 mg/dL — ABNORMAL HIGH (ref 0.44–1.00)
Glucose, Bld: 76 mg/dL (ref 70–99)
HCT: 38 % (ref 36.0–46.0)
Hemoglobin: 12.9 g/dL (ref 12.0–15.0)
Potassium: 4.3 mmol/L (ref 3.5–5.1)
Sodium: 135 mmol/L (ref 135–145)
TCO2: 24 mmol/L (ref 22–32)

## 2022-05-07 LAB — RESP PANEL BY RT-PCR (FLU A&B, COVID) ARPGX2
Influenza A by PCR: NEGATIVE
Influenza B by PCR: NEGATIVE
SARS Coronavirus 2 by RT PCR: NEGATIVE

## 2022-05-07 LAB — RAPID URINE DRUG SCREEN, HOSP PERFORMED
Amphetamines: NOT DETECTED
Barbiturates: NOT DETECTED
Benzodiazepines: NOT DETECTED
Cocaine: NOT DETECTED
Opiates: NOT DETECTED
Tetrahydrocannabinol: NOT DETECTED

## 2022-05-07 LAB — CBC
HCT: 35.5 % — ABNORMAL LOW (ref 36.0–46.0)
Hemoglobin: 11.7 g/dL — ABNORMAL LOW (ref 12.0–15.0)
MCH: 29.8 pg (ref 26.0–34.0)
MCHC: 33 g/dL (ref 30.0–36.0)
MCV: 90.6 fL (ref 80.0–100.0)
Platelets: 305 10*3/uL (ref 150–400)
RBC: 3.92 MIL/uL (ref 3.87–5.11)
RDW: 15.1 % (ref 11.5–15.5)
WBC: 6.1 10*3/uL (ref 4.0–10.5)
nRBC: 0 % (ref 0.0–0.2)

## 2022-05-07 LAB — APTT: aPTT: 25 seconds (ref 24–36)

## 2022-05-07 LAB — PROTIME-INR
INR: 0.9 (ref 0.8–1.2)
Prothrombin Time: 12.1 seconds (ref 11.4–15.2)

## 2022-05-07 LAB — CBG MONITORING, ED: Glucose-Capillary: 77 mg/dL (ref 70–99)

## 2022-05-07 LAB — MRSA NEXT GEN BY PCR, NASAL: MRSA by PCR Next Gen: NOT DETECTED

## 2022-05-07 LAB — ETHANOL: Alcohol, Ethyl (B): <10 mg/dL (ref <10)

## 2022-05-07 MED ORDER — PANTOPRAZOLE SODIUM 40 MG IV SOLR
40.0000 mg | Freq: Every day | INTRAVENOUS | Status: DC
Start: 2022-05-07 — End: 2022-05-07

## 2022-05-07 MED ORDER — TRAMADOL HCL 50 MG PO TABS
100.0000 mg | ORAL_TABLET | Freq: Three times a day (TID) | ORAL | Status: DC
  Administered 2022-05-07 – 2022-05-08 (×3): 100 mg via ORAL
  Filled 2022-05-07 (×3): qty 2

## 2022-05-07 MED ORDER — ACETAMINOPHEN 160 MG/5ML PO SOLN: 650.0000 mg | ORAL | Status: DC | PRN

## 2022-05-07 MED ORDER — SODIUM CHLORIDE 0.9 % IV SOLN: INTRAVENOUS | Status: DC

## 2022-05-07 MED ORDER — LABETALOL HCL 5 MG/ML IV SOLN
INTRAVENOUS | Status: AC
  Filled 2022-05-07: qty 4

## 2022-05-07 MED ORDER — CHLORHEXIDINE GLUCONATE CLOTH 2 % EX PADS
6.0000 | MEDICATED_PAD | Freq: Every day | CUTANEOUS | Status: DC
  Administered 2022-05-07 – 2022-05-08 (×2): 6 via TOPICAL

## 2022-05-07 MED ORDER — CARBAMAZEPINE ER 100 MG PO TB12
300.0000 mg | ORAL_TABLET | Freq: Every day | ORAL | Status: DC
  Administered 2022-05-07 – 2022-05-08 (×2): 300 mg via ORAL
  Filled 2022-05-07 (×2): qty 3

## 2022-05-07 MED ORDER — CLEVIDIPINE BUTYRATE 0.5 MG/ML IV EMUL: 0.0000 mg/h | INTRAVENOUS | Status: DC

## 2022-05-07 MED ORDER — IOHEXOL 350 MG/ML SOLN
100.0000 mL | Freq: Once | INTRAVENOUS | Status: AC | PRN
  Administered 2022-05-07: 100 mL via INTRAVENOUS

## 2022-05-07 MED ORDER — TENECTEPLASE FOR STROKE
0.2500 mg/kg | PACK | Freq: Once | INTRAVENOUS | Status: AC
Start: 2022-05-07 — End: 2022-05-07
  Administered 2022-05-07: 16 mg via INTRAVENOUS
  Filled 2022-05-07: qty 10

## 2022-05-07 MED ORDER — STROKE: EARLY STAGES OF RECOVERY BOOK
Freq: Once | Status: AC
Start: 2022-05-08 — End: 2022-05-08
  Filled 2022-05-07: qty 1

## 2022-05-07 MED ORDER — CLEVIDIPINE BUTYRATE 0.5 MG/ML IV EMUL
INTRAVENOUS | Status: AC
  Administered 2022-05-07: 2 mg/h via INTRAVENOUS
  Filled 2022-05-07: qty 50

## 2022-05-07 MED ORDER — TIZANIDINE HCL 2 MG PO TABS
4.0000 mg | ORAL_TABLET | Freq: Every day | ORAL | Status: DC
  Administered 2022-05-07: 4 mg via ORAL
  Filled 2022-05-07 (×2): qty 2

## 2022-05-07 MED ORDER — PANTOPRAZOLE SODIUM 40 MG PO TBEC
40.0000 mg | DELAYED_RELEASE_TABLET | Freq: Every day | ORAL | Status: DC
  Administered 2022-05-07 – 2022-05-08 (×2): 40 mg via ORAL
  Filled 2022-05-07 (×2): qty 1

## 2022-05-07 MED ORDER — LABETALOL HCL 5 MG/ML IV SOLN
10.0000 mg | Freq: Once | INTRAVENOUS | Status: AC
  Administered 2022-05-07: 10 mg via INTRAVENOUS

## 2022-05-07 MED ORDER — SENNOSIDES-DOCUSATE SODIUM 8.6-50 MG PO TABS: 1.0000 | ORAL_TABLET | Freq: Every evening | ORAL | Status: DC | PRN

## 2022-05-07 MED ORDER — ORAL CARE MOUTH RINSE
15.0000 mL | OROMUCOSAL | Status: DC | PRN
Start: 2022-05-07 — End: 2022-05-08

## 2022-05-07 MED ORDER — ACETAMINOPHEN 650 MG RE SUPP: 650.0000 mg | RECTAL | Status: DC | PRN

## 2022-05-07 MED ORDER — LABETALOL HCL 5 MG/ML IV SOLN: 10.0000 mg | Freq: Once | INTRAVENOUS | Status: DC

## 2022-05-07 MED ORDER — ACETAMINOPHEN 325 MG PO TABS
650.0000 mg | ORAL_TABLET | ORAL | Status: DC | PRN
  Administered 2022-05-07 – 2022-05-08 (×2): 650 mg via ORAL
  Filled 2022-05-07 (×2): qty 2

## 2022-05-07 MED ORDER — VENLAFAXINE HCL ER 75 MG PO CP24
75.0000 mg | ORAL_CAPSULE | Freq: Every day | ORAL | Status: DC
  Administered 2022-05-07: 75 mg via ORAL
  Filled 2022-05-07 (×2): qty 1

## 2022-05-07 MED ORDER — GABAPENTIN 300 MG PO CAPS
600.0000 mg | ORAL_CAPSULE | Freq: Three times a day (TID) | ORAL | Status: DC
  Administered 2022-05-07 – 2022-05-08 (×3): 600 mg via ORAL
  Filled 2022-05-07 (×3): qty 2

## 2022-05-07 MED ORDER — ACYCLOVIR 400 MG PO TABS
400.0000 mg | ORAL_TABLET | Freq: Every day | ORAL | Status: DC
  Administered 2022-05-07 – 2022-05-08 (×2): 400 mg via ORAL
  Filled 2022-05-07 (×2): qty 1

## 2022-05-07 NOTE — H&P (Signed)
Please refer to my consult note. Thanks.   Marvel Plan, MD PhD Stroke Neurology 05/07/2022 5:03 PM

## 2022-05-07 NOTE — Progress Notes (Signed)
PHARMACIST CODE STROKE RESPONSE  Notified to mix TNK at 0936 by Dr. Roda Shutters TNK preparation completed at [phone]  TNK dose = 16 mg IV over 5 seconds  Issues/delays encountered (if applicable): n/a   Leia Alf, PharmD, BCPS Please check AMION for all [facility] contact numbers Clinical Pharmacist 05/07/2022 9:40 AM

## 2022-05-07 NOTE — Progress Notes (Signed)
SLP Cancellation Note  Patient Details Name: Debbie Hatfield MRN: [id number] DOB: February 07, 1956   Cancelled treatment:       Reason Eval/Treat Not Completed: SLP screened, no needs identified, will sign off.  Pt passed [facility] swallow screen since swallow evaluation evaluation order placed. No formalized SLP swallow eval is needed per protocol; SLP will follow for speech-language evaluation only.  Anivea Velasques I. Vear Clock, MS, CCC-SLP Acute Rehabilitation Services Office number [phone] Pager [phone]  Scheryl Marten 05/07/2022, 3:00 PM

## 2022-05-07 NOTE — ED Notes (Signed)
Into ED orange 15 with neuro MD, and stroke RN. Pt alert, NAD, calm, interactive.

## 2022-05-07 NOTE — ED Notes (Signed)
Report called to [id number] Beryl Meager, RN, ready to receive pt. Echo in progress at [facility] now. Stroke RN at [facility] [place]. VSS/BP improved.

## 2022-05-07 NOTE — ED Provider Notes (Signed)
[facility] EMERGENCY DEPARTMENT Provider Note   CSN: [id number] Arrival date & time: 05/07/22  0539  An emergency department physician performed an initial assessment on this suspected stroke patient at 57.  History  Chief Complaint  Patient presents with   Code Stroke    Debbie Hatfield is a 66 y.o. female.  HPI Patient presents with facial droop and difficulty speaking.  Last normal last night around 1030 when she went to bed.  Woke up with a facial droop this morning and discovered difficulty speaking.  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  Found to have a deficits brought down to the ER.  Code stroke called and I met the patient prior to being in her room.  No headache.  States she has difficulty getting the sentences out.  States the words are in her head but trouble getting to speak them.   Past Medical History:  Diagnosis Date   Closed fracture of right distal radius    Gastroparesis    GERD (gastroesophageal reflux disease)    Hypertension    Irritable bowel disease    Lupus ([place])    Major depression    Trigeminal neuralgia     Home Medications Prior to Admission medications   Medication Sig Start Date End Date Taking? Authorizing Provider  acyclovir (ZOVIRAX) 400 MG tablet Take 1 tablet (400 mg total) by mouth daily. 03/26/22   Janith Lima, MD  amLODipine (NORVASC) 5 MG tablet Take 1 tablet (5 mg total) by mouth daily. 03/27/22   Janith Lima, MD  carbamazepine (CARBATROL) 300 MG 12 hr capsule Take 1 capsule (300 mg total) by mouth daily. 03/26/22   Janith Lima, MD  esomeprazole (NEXIUM) 40 MG capsule Take 40 mg by mouth Nightly.    [provider]  gabapentin (NEURONTIN) 300 MG capsule Take 600 mg by mouth 3 (three) times daily.    [provider]  hydrOXYzine (ATARAX) 10 MG tablet Take 1 tablet (10 mg total) by mouth every 8 (eight) hours as needed. 11/15/21   Janith Lima, MD  leucovorin  (WELLCOVORIN) 5 MG tablet Take 5 mg by mouth once a week.    [provider]  telmisartan-hydrochlorothiazide (MICARDIS HCT) 40-12.5 MG tablet TAKE 1 TABLET BY MOUTH DAILY 04/24/22   Janith Lima, MD  tizanidine (ZANAFLEX) 2 MG capsule Take 1 capsule by mouth at bedtime. 12/16/20   [provider]  traMADol (ULTRAM) 50 MG tablet Take 50 mg by mouth every 6 (six) hours as needed.    [provider]  venlafaxine XR (EFFEXOR XR) 37.5 MG 24 hr capsule Take 2 capsules (75 mg total) by mouth daily with breakfast. 11/15/21   Janith Lima, MD      Allergies    Plaquenil [hydroxychloroquine], Sulfa antibiotics, and Penicillins    Review of Systems   Review of Systems  Physical Exam Updated Vital Signs BP (!) 177/80   Pulse 68   Temp 97.9 F (36.6 C) (Oral)   Resp (!) 9   Ht _0  (1.575 m)   Wt 65.8 kg   SpO2 100%   BMI 26.53 kg/m  Physical Exam HENT:     Head: Atraumatic.  Eyes:     Extraocular Movements: Extraocular movements intact.  Cardiovascular:     Rate and Rhythm: Regular rhythm.  Musculoskeletal:     Cervical back: Neck supple.  Skin:    General: Skin  is warm.  Neurological:     Mental Status: She is alert.     Comments: Left lower facial droop.  Forehead appears to be spared.  Some difficulty with speech.  Appears to have normal reception of speech but difficulty getting the words out.  Somewhat broken up speech.  Moving all extremities equally.   Complete NIH scoring done by neurology.  ED Results / Procedures / Treatments   Labs (all labs ordered are listed, but only abnormal results are displayed) Labs Reviewed  CBC - Abnormal; Notable for the following components:      Result Value   Hemoglobin 11.7 (*)    HCT 35.5 (*)    All other components within normal limits  DIFFERENTIAL - Abnormal; Notable for the following components:   Monocytes Absolute 1.2 (*)    All other components within normal limits  I-STAT CHEM 8, ED - Abnormal;  Notable for the following components:   Creatinine, Ser 1.10 (*)    Calcium, Ion 1.09 (*)    All other components within normal limits  RESP PANEL BY RT-PCR (FLU A&B, COVID) ARPGX2  PROTIME-INR  APTT  ETHANOL  COMPREHENSIVE METABOLIC PANEL  RAPID URINE DRUG SCREEN, HOSP PERFORMED  URINALYSIS, ROUTINE W REFLEX MICROSCOPIC  CBG MONITORING, ED    EKG None  Radiology MR BRAIN WO CONTRAST  Result Date: 05/07/2022 CLINICAL DATA:  Stroke, follow up EXAM: MRI HEAD WITHOUT CONTRAST TECHNIQUE: Multiplanar, multiecho pulse sequences of the brain and surrounding structures were obtained without intravenous contrast. COMPARISON:  Same day CT head and CTA head/neck. FINDINGS: Brain: Small acute infarct in the right posterior frontal white matter. Slight edema without mass effect. Moderate scattered T2/FLAIR hyperintensities within the white matter. No evidence of acute hemorrhage, mass lesion, midline shift or hydrocephalus. Vascular: Major arterial flow voids are maintained at the skull base Skull and upper cervical spine: Normal marrow signal. Sinuses/Orbits: Visualized sinuses are clear. No acute orbital findings. Other: No mastoid effusions. IMPRESSION: 1. Small acute infarct in the right posterior frontal white matter. 2. Moderate scattered T2/FLAIR hyperintensities the white matter, which are nonspecific but most likely due to chronic microvascular ischemic disease given the patient's risk factors. Chronic demyelination is thought less likely. Electronically Signed   By: Margaretha Sheffield M.D.   On: 05/07/2022 09:38   CT ANGIO HEAD NECK W WO CM W PERF (CODE STROKE)  Result Date: 05/07/2022 CLINICAL DATA:  Neuro deficit, acute, stroke suspected. EXAM: CT ANGIOGRAPHY HEAD AND NECK TECHNIQUE: Multidetector CT imaging of the head and neck was performed using the standard protocol during bolus administration of intravenous contrast. Multiplanar CT image reconstructions and MIPs were obtained to evaluate  the vascular anatomy. Carotid stenosis measurements (when applicable) are obtained utilizing NASCET criteria, using the distal internal carotid diameter as the denominator. RADIATION DOSE REDUCTION: This exam was performed according to the departmental dose-optimization program which includes automated exposure control, adjustment of the mA and/or kV according to patient size and/or use of iterative reconstruction technique. CONTRAST:  121m OMNIPAQUE IOHEXOL 350 MG/ML SOLN COMPARISON:  Same day CT head. FINDINGS: CTA NECK FINDINGS Aortic arch: Great vessel origins are patent. Right carotid system: No evidence of dissection, stenosis (50% or greater) or occlusion. Left carotid system: No evidence of dissection, stenosis (50% or greater) or occlusion. Vertebral arteries: Reflux of contrast into the venous vessels in the neck results in streak artifact and significantly limits evaluation the vertebral arteries. The reflux of contrast in the neck is likely due to narrowing  of the brachiocephalic vein subjacent to the sternum. Within this limitation, both vertebral arteries appear patent without visible high-grade stenosis. Skeleton: No acute fracture. Other neck: 2.0 cm right thyroid nodule. Upper chest: Visualized lung apices are clear. Review of the MIP images confirms the above findings CTA HEAD FINDINGS Anterior circulation: Bilateral intracranial ICAs, MCAs and ACAs are patent without proximal hemodynamically significant stenosis. Posterior circulation: Bilateral intradural vertebral arteries, basilar artery, and both posterior cerebral arteries are patent without proximal hemodynamically significant stenosis. Venous sinuses: As permitted by contrast timing, patent. Review of the MIP images confirms the above findings IMPRESSION: 1. No emergent large vessel occlusion. 2. No visible hemodynamically significant stenosis in the neck with limited assessment of the vertebral arteries due to reflux of venous contrast  into the neck. 3. Approximately 2.0 cm right thyroid nodule. Recommend thyroid ultrasound (ref: J Am Coll Radiol. 2015 Feb;12(2): 143-50). Electronically Signed   By: Margaretha Sheffield M.D.   On: 05/07/2022 09:26   CT HEAD CODE STROKE WO CONTRAST  Result Date: 05/07/2022 CLINICAL DATA:  Code stroke. Neuro deficit, acute, stroke suspected. EXAM: CT HEAD WITHOUT CONTRAST TECHNIQUE: Contiguous axial images were obtained from the base of the skull through the vertex without intravenous contrast. RADIATION DOSE REDUCTION: This exam was performed according to the departmental dose-optimization program which includes automated exposure control, adjustment of the mA and/or kV according to patient size and/or use of iterative reconstruction technique. COMPARISON:  03/26/2022 FINDINGS: Brain: No acute intracranial finding. Question old small left parietal cortical infarction as seen previously. No mass, hemorrhage, hydrocephalus or extra-axial collection. Aided Vascular: Minimal calcification in the carotid siphon regions. Otherwise negative. No hyperdense vessel identified. Skull: Normal Sinuses/Orbits: Clear/normal Other: None ASPECTS ([place] Stroke Program Early CT Score) - Ganglionic level infarction (caudate, lentiform nuclei, internal capsule, insula, M1-M3 cortex): 7 - Supraganglionic infarction (M4-M6 cortex): 3 Total score (0-10 with 10 being normal): 10 IMPRESSION: 1. No acute CT finding. Question old small left parietal cortical infarction, unchanged from prior exams. 2. ASPECTS is 10. 3. These results were communicated to Dr. Erlinda Hong at 9:05 am on 05/07/2022 by text page via the [facility] messaging system. Electronically Signed   By: Nelson Chimes M.D.   On: 05/07/2022 09:06    Procedures Procedures    Medications Ordered in ED Medications  labetalol (NORMODYNE) injection 10 mg (has no administration in time range)  clevidipine (CLEVIPREX) infusion 0.5 mg/mL (has no administration in time range)  labetalol  (NORMODYNE) 5 MG/ML injection (has no administration in time range)  iohexol (OMNIPAQUE) 350 MG/ML injection 100 mL (100 mLs Intravenous Contrast Given 05/07/22 0907)  tenecteplase (TNKASE) injection for Stroke 16 mg (16 mg Intravenous Given 05/07/22 0939)  labetalol (NORMODYNE) injection 10 mg (10 mg Intravenous Given 05/07/22 1005)    ED Course/ Medical Decision Making/ A&P                           Medical Decision Making Amount and/or Complexity of Data Reviewed Labs: ordered. Radiology: ordered.  Risk Decision regarding hospitalization.  Patient presented with difficulty speaking and facial droop.  Last normal last night however.  Woke up with symptoms.  Code stroke called initially by stroke nurse after discussing with Dr. Leonel Ramsay who had not seen the patient.  I then evaluated her and does have deficits.  Taken emergently to CT scan.  Initial head CT reassuring independently interpreted by me.  CTA also done.  MRI done emergently and did  show stroke.  Also interestingly showed potential MS that would not be the cause of the symptoms.  Dr.Xu from neurology gave TNK at 938. Patient will be admitted to the neuro ICU.  CRITICAL CARE Performed by: Davonna Belling Total critical care time: 30 minutes Critical care time was exclusive of separately billable procedures and treating other patients. Critical care was necessary to treat or prevent imminent or life-threatening deterioration. Critical care was time spent personally by me on the following activities: development of treatment plan with patient and/or surrogate as well as nursing, discussions with consultants, evaluation of patient's response to treatment, examination of patient, obtaining history from patient or surrogate, ordering and performing treatments and interventions, ordering and review of laboratory studies, ordering and review of radiographic studies, pulse oximetry and re-evaluation of patient's  condition.         Final Clinical Impression(s) / ED Diagnoses Final diagnoses:  Cerebrovascular accident (CVA), unspecified mechanism [facility])    Rx / Margaret Orders ED Discharge Orders     None         Davonna Belling, MD 05/07/22 1008

## 2022-05-07 NOTE — Consult Note (Signed)
Neurology Consultation  Reason for Consult: Code Stroke Referring Physician: Rubin Payor  CC: Dysarthria, left facial droop  History is obtained from: Patient  HPI: Debbie Hatfield is a 66 y.o. female with a past medical hisotry of undifferentiated connective tissue disease, GERD, gastroparesis, HTN, trigeminal neuralgia (left), and IBS. She went to bed at 2230 last night, woke up this morning around 6am seemed felt some unusual feeling on the left face but not much she could tell. When she drove to work at 7:30am she felt left face numbness, and she noted left facial droop. At work she had dysarthria, left facial droop, and a left sensory deficit. She also reported some tongue thickness feeling, difficulty swallowing and difficulty getting words out. She was sent to ED for emergent evaluation.   She has been seeing rheumatology for achy pain and fatigue, and labs unremarkable. Diagnosed with undifferentiated connective tissue disease but more likel fibromyalgia. She was not able to tolerate plaquenil in the past. Currently not on any immunosuppressive meds.   She also has hx of left trigeminal neuralgia, on tegretol and controlled well. Last flare was 2-3 years ago with left facial pain.  She is not on blood thinners.   LKW: 2230 7/23 IV thrombolysis given?: yes, per woke up protocol Premorbid modified Rankin scale (mRS): 1  ROS: Full ROS was performed and is negative except as noted in the HPI.   Past Medical History:  Diagnosis Date   Closed fracture of right distal radius    Gastroparesis    GERD (gastroesophageal reflux disease)    Hypertension    Irritable bowel disease    Lupus (HCC)    Major depression    Trigeminal neuralgia     Family History  Problem Relation Age of Onset   Heart block Mother        Pacemaker   Hypertension Mother    Depression Mother    Gout Mother    Macular degeneration Mother    Alcoholism Father    Congestive Heart Failure Maternal Grandmother     Diabetes Maternal Grandmother    Congestive Heart Failure Maternal Grandfather    Colon cancer Maternal Grandfather    Stroke Paternal Grandfather    Hypertension Paternal Grandfather     Social History:   reports that she has never smoked. She has never used smokeless tobacco. She reports current alcohol use. She reports that she does not use drugs.  Medications No current facility-administered medications for this encounter.  Current Outpatient Medications:    acyclovir (ZOVIRAX) 400 MG tablet, Take 1 tablet (400 mg total) by mouth daily., Disp: 90 tablet, Rfl: 0   amLODipine (NORVASC) 5 MG tablet, Take 1 tablet (5 mg total) by mouth daily., Disp: 90 tablet, Rfl: 0   carbamazepine (CARBATROL) 300 MG 12 hr capsule, Take 1 capsule (300 mg total) by mouth daily., Disp: 90 capsule, Rfl: 0   esomeprazole (NEXIUM) 40 MG capsule, Take 40 mg by mouth Nightly., Disp: , Rfl:    gabapentin (NEURONTIN) 300 MG capsule, Take 600 mg by mouth 3 (three) times daily., Disp: , Rfl:    hydrOXYzine (ATARAX) 10 MG tablet, Take 1 tablet (10 mg total) by mouth every 8 (eight) hours as needed., Disp: 270 tablet, Rfl: 0   leucovorin (WELLCOVORIN) 5 MG tablet, Take 5 mg by mouth once a week., Disp: , Rfl:    telmisartan-hydrochlorothiazide (MICARDIS HCT) 40-12.5 MG tablet, TAKE 1 TABLET BY MOUTH DAILY, Disp: 90 tablet, Rfl: 0   tizanidine (  ZANAFLEX) 2 MG capsule, Take 1 capsule by mouth at bedtime., Disp: , Rfl:    traMADol (ULTRAM) 50 MG tablet, Take 50 mg by mouth every 6 (six) hours as needed., Disp: , Rfl:    venlafaxine XR (EFFEXOR XR) 37.5 MG 24 hr capsule, Take 2 capsules (75 mg total) by mouth daily with breakfast., Disp: 180 capsule, Rfl: 1  Exam: Current vital signs: BP (!) 176/76  Vital signs in last 24 hours: BP: (176)/(76) 176/76 (07/24 0853)  GENERAL: Awake, alert in NAD HEENT: - Normocephalic and atraumatic, dry mm, no LN++, no Thyromegally LUNGS - Clear to auscultation bilaterally with no  wheezes CV - S1S2 RRR, no m/r/g, equal pulses bilaterally. ABDOMEN - Soft, nontender, nondistended with normoactive BS Ext: warm, well perfused, intact peripheral pulses, no edema  NEURO:  Mental Status: AA&Ox3  Language: speech is mildly dysarthric.  Naming, repetition and comprehension intact. Slow on speech, but no aphasia.  Cranial Nerves: PERRL EOMI, visual fields full, mild left facial droop, left facial sensation mildly decreased comparing with right, hearing intact, tongue/uvula/soft palate midline, normal sternocleidomastoid and trapezius muscle strength. No evidence of tongue atrophy or fibrillations Left facial droop  Motor: Strength is 5/5 in all extremities Tone: is normal and bulk is normal Sensation- Intact to light touch bilaterally Coordination: FTN intact bilaterally, no ataxia in BLE. Gait- deferred  NIHSS 1a Level of Conscious.: 0 1b LOC Questions: 0 1c LOC Commands: 0 2 Best Gaze: 0 3 Visual: 0 4 Facial Palsy: 1 5a Motor Arm - left: 0 5b Motor Arm - Right: 0 6a Motor Leg - Left: 0 6b Motor Leg - Right: 0 7 Limb Ataxia: 0 8 Sensory: 1 9 Best Language: 0 10 Dysarthria: 1 11 Extinct. and Inatten.: 0 TOTAL: 3   Labs I have reviewed labs in epic and the results pertinent to this consultation are:  CBC    Component Value Date/Time   WBC 5.9 03/26/2022 1021   RBC 3.90 03/26/2022 1021   HGB 11.5 (L) 03/26/2022 1021   HCT 35.0 (L) 03/26/2022 1021   PLT 325.0 03/26/2022 1021   MCV 89.9 03/26/2022 1021   MCH 28.7 01/07/2021 1212   MCHC 32.8 03/26/2022 1021   RDW 13.7 03/26/2022 1021   LYMPHSABS 2.4 03/26/2022 1021   MONOABS 0.9 03/26/2022 1021   EOSABS 0.1 03/26/2022 1021   BASOSABS 0.0 03/26/2022 1021    CMP     Component Value Date/Time   NA 143 11/15/2021 1148   K 4.3 11/15/2021 1148   CL 105 11/15/2021 1148   CO2 30 11/15/2021 1148   GLUCOSE 94 11/15/2021 1148   BUN 19 11/15/2021 1148   CREATININE 1.06 11/15/2021 1148   CALCIUM 9.9  11/15/2021 1148   GFRNONAA >60 01/07/2021 1212   GFRAA >90 07/07/2012 0610    Lipid Panel     Component Value Date/Time   CHOL 223 (H) 03/26/2022 1021   TRIG 105.0 03/26/2022 1021   HDL 102.00 03/26/2022 1021   CHOLHDL 2 03/26/2022 1021   VLDL 21.0 03/26/2022 1021   LDLCALC 100 (H) 03/26/2022 1021     Imaging I have reviewed the images obtained:  MR BRAIN WO CONTRAST  Result Date: 05/07/2022 CLINICAL DATA:  Stroke, follow up EXAM: MRI HEAD WITHOUT CONTRAST TECHNIQUE: Multiplanar, multiecho pulse sequences of the brain and surrounding structures were obtained without intravenous contrast. COMPARISON:  Same day CT head and CTA head/neck. FINDINGS: Brain: Small acute infarct in the right posterior frontal white matter. Slight  edema without mass effect. Moderate scattered T2/FLAIR hyperintensities within the white matter. No evidence of acute hemorrhage, mass lesion, midline shift or hydrocephalus. Vascular: Major arterial flow voids are maintained at the skull base Skull and upper cervical spine: Normal marrow signal. Sinuses/Orbits: Visualized sinuses are clear. No acute orbital findings. Other: No mastoid effusions. IMPRESSION: 1. Small acute infarct in the right posterior frontal white matter. 2. Moderate scattered T2/FLAIR hyperintensities the white matter, which are nonspecific but most likely due to chronic microvascular ischemic disease given the patient's risk factors. Chronic demyelination is thought less likely. Electronically Signed   By: Margaretha Sheffield M.D.   On: 05/07/2022 09:38   CT ANGIO HEAD NECK W WO CM W PERF (CODE STROKE)  Result Date: 05/07/2022 CLINICAL DATA:  Neuro deficit, acute, stroke suspected. EXAM: CT ANGIOGRAPHY HEAD AND NECK TECHNIQUE: Multidetector CT imaging of the head and neck was performed using the standard protocol during bolus administration of intravenous contrast. Multiplanar CT image reconstructions and MIPs were obtained to evaluate the vascular  anatomy. Carotid stenosis measurements (when applicable) are obtained utilizing NASCET criteria, using the distal internal carotid diameter as the denominator. RADIATION DOSE REDUCTION: This exam was performed according to the departmental dose-optimization program which includes automated exposure control, adjustment of the mA and/or kV according to patient size and/or use of iterative reconstruction technique. CONTRAST:  121mL OMNIPAQUE IOHEXOL 350 MG/ML SOLN COMPARISON:  Same day CT head. FINDINGS: CTA NECK FINDINGS Aortic arch: Great vessel origins are patent. Right carotid system: No evidence of dissection, stenosis (50% or greater) or occlusion. Left carotid system: No evidence of dissection, stenosis (50% or greater) or occlusion. Vertebral arteries: Reflux of contrast into the venous vessels in the neck results in streak artifact and significantly limits evaluation the vertebral arteries. The reflux of contrast in the neck is likely due to narrowing of the brachiocephalic vein subjacent to the sternum. Within this limitation, both vertebral arteries appear patent without visible high-grade stenosis. Skeleton: No acute fracture. Other neck: 2.0 cm right thyroid nodule. Upper chest: Visualized lung apices are clear. Review of the MIP images confirms the above findings CTA HEAD FINDINGS Anterior circulation: Bilateral intracranial ICAs, MCAs and ACAs are patent without proximal hemodynamically significant stenosis. Posterior circulation: Bilateral intradural vertebral arteries, basilar artery, and both posterior cerebral arteries are patent without proximal hemodynamically significant stenosis. Venous sinuses: As permitted by contrast timing, patent. Review of the MIP images confirms the above findings IMPRESSION: 1. No emergent large vessel occlusion. 2. No visible hemodynamically significant stenosis in the neck with limited assessment of the vertebral arteries due to reflux of venous contrast into the neck.  3. Approximately 2.0 cm right thyroid nodule. Recommend thyroid ultrasound (ref: J Am Coll Radiol. 2015 Feb;12(2): 143-50). Electronically Signed   By: Margaretha Sheffield M.D.   On: 05/07/2022 09:26   CT HEAD CODE STROKE WO CONTRAST  Result Date: 05/07/2022 CLINICAL DATA:  Code stroke. Neuro deficit, acute, stroke suspected. EXAM: CT HEAD WITHOUT CONTRAST TECHNIQUE: Contiguous axial images were obtained from the base of the skull through the vertex without intravenous contrast. RADIATION DOSE REDUCTION: This exam was performed according to the departmental dose-optimization program which includes automated exposure control, adjustment of the mA and/or kV according to patient size and/or use of iterative reconstruction technique. COMPARISON:  03/26/2022 FINDINGS: Brain: No acute intracranial finding. Question old small left parietal cortical infarction as seen previously. No mass, hemorrhage, hydrocephalus or extra-axial collection. Aided Vascular: Minimal calcification in the carotid siphon regions. Otherwise negative.  No hyperdense vessel identified. Skull: Normal Sinuses/Orbits: Clear/normal Other: None ASPECTS ([place] Stroke Program Early CT Score) - Ganglionic level infarction (caudate, lentiform nuclei, internal capsule, insula, M1-M3 cortex): 7 - Supraganglionic infarction (M4-M6 cortex): 3 Total score (0-10 with 10 being normal): 10 IMPRESSION: 1. No acute CT finding. Question old small left parietal cortical infarction, unchanged from prior exams. 2. ASPECTS is 10. 3. These results were communicated to Dr. Erlinda Hong at 9:05 am on 05/07/2022 by text page via the [facility] messaging system. Electronically Signed   By: Nelson Chimes M.D.   On: 05/07/2022 09:06     Assessment: 66 yo F with a past medical hisotry of undifferentiated connective tissue disease, HTN, trigeminal neuralgia (left), and IBS presented to ED for left facial droop, slurry speech and left facial numbness. LSW 2230 last night, but symptoms more  likely started at 6am-7:30am, but not quite sure. CT no acute finding, CTA head and neck and CTP negative. MRI showed small right CR lacunar infarct with DWI-FLAIR mismatch. Discussed with pt about TNK benefit and risk, she is agreeable with TNK. Not IR candidate given no LVO and negative CTP. Will admit to neuro ICU for post TNK care.    Recommendations: Admit for routine post IV thrombolytics care  Check blood pressure and NIHSS every 15 min for 2 h, then every 30 min for 6 h, and finally every hour for 16 h BP goal < 180/105. Will give labetalol once and put on cleviprex PRN. CT or MRI brain 24 hours post thrombolytics Stat CT head without contrast if acute neuro changesNPO until swallowing screen performed and passed No antiplatelet agents or anticoagulants (including heparin for DVT prophylaxis) in first 24 hours No Foley catheter, nasogastric tube, arterial catheter or central venous catheter for 24 hours, unless absolutely necessary Telemetry Euglycemia  Avoid hyperthermia, PRN acetaminophen DVT prophylaxis with SCDsDiscussed with Dr. Alvino Chapel ED physician  Rosalin Hawking, MD PhD Stroke Neurology 05/07/2022 10:08 AM  This patient is critically ill due to acute stroke s/p TNK and at significant risk of neurological worsening, death form recurrent stroke, hemorrhagic conversion and bleeding from TNK. This patient's care requires constant monitoring of vital signs, hemodynamics, respiratory and cardiac monitoring, review of multiple databases, neurological assessment, discussion with family, other specialists and medical decision making of high complexity. I spent 60 minutes of neurocritical care time in the care of this patient.

## 2022-05-07 NOTE — ED Notes (Signed)
EDP at BS 

## 2022-05-07 NOTE — Plan of Care (Signed)
  Problem: Education: Goal: Knowledge of General Education information will improve Description: Including pain rating scale, medication(s)/side effects and non-pharmacologic comfort measures Outcome: Progressing   Problem: Clinical Measurements: Goal: Ability to maintain clinical measurements within normal limits will improve Outcome: Progressing   

## 2022-05-07 NOTE — ED Notes (Addendum)
BP 170/83. TNK given in MRI at 0939. Neuro MD present.

## 2022-05-07 NOTE — Code Documentation (Addendum)
Stroke Response Nurse Documentation Code Documentation  Debbie Hatfield is a 66 y.o. female arriving to [facility]  via  Wheelchair from [place]  on 05/07/2022 with past medical hx of Lupus, HTN, Trigeminal Neuralgia. Code stroke was activated by ED.   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 This morning, pt originally reported waking up with left sided facial numbness and facial weakness. She reported talking to her cats at 0730 and noted slurred speech at that time. When she was in the car, she reported looking in the mirror and seeing left sided facial droop and feeling numbness on the left side. She came into work and started talking to her coworker and they noted slight aphasia and dysarthria at this time.   [facility] [place] staff called Rapid Response at [phone] and this Stroke RN went and evaluated the patient on [place]. Upon arrival, pt was noted to have left sided facial droop, left sided facial numbness, slurred speech, and very slight aphasia. Her speech was noted to be in short sentences with lack of fluency. Pt was brought down to the ED with LKW thought to be at 1030pm when she went to bed. At that time, pt did not meet criteria to activate Code Stroke by protocol.   Neurology called and stated to activate due to aphasia. EDP evaluated at the bridge. Pt was taken directly to CT and MD Xu evaluated in CT. NIHSS 5 - See Stroke Timeline. CT, CTA completed. Decision to take patient to MRI at this time for potential WAKE UP stroke.   After MRI, pt started to report that she got up at 0600 and ate yogurt with no problem. "The more I start thinking about it. I woke up and felt fine, ate yogurt without having trouble swallowing, and then noted it when talking to my cats at 0730."   The following imaging was completed:  CT Head, CTA, and MRI. Patient is a candidate for IV Thrombolytic due to having stroke like symptoms and DWI/Flair mismatch noted by MD Roda Shutters. Patient is not a candidate for IR  due to no LVO noted on imaging per provider.   Care Plan: Post-TNK Protocol and Stroke- Work-up.    Debbie Hatfield  Stroke Response RN

## 2022-05-08 ENCOUNTER — Encounter: Payer: Self-pay | Admitting: Internal Medicine

## 2022-05-08 ENCOUNTER — Inpatient Hospital Stay (HOSPITAL_COMMUNITY): Payer: Medicare Other

## 2022-05-08 DIAGNOSIS — I63311 Cerebral infarction due to thrombosis of right middle cerebral artery: Secondary | ICD-10-CM

## 2022-05-08 LAB — CBC
HCT: 33.4 % — ABNORMAL LOW (ref 36.0–46.0)
Hemoglobin: 10.9 g/dL — ABNORMAL LOW (ref 12.0–15.0)
MCH: 29.5 pg (ref 26.0–34.0)
MCHC: 32.6 g/dL (ref 30.0–36.0)
MCV: 90.5 fL (ref 80.0–100.0)
Platelets: 294 10*3/uL (ref 150–400)
RBC: 3.69 MIL/uL — ABNORMAL LOW (ref 3.87–5.11)
RDW: 15 % (ref 11.5–15.5)
WBC: 5.3 10*3/uL (ref 4.0–10.5)
nRBC: 0 % (ref 0.0–0.2)

## 2022-05-08 LAB — HEMOGLOBIN A1C
Hgb A1c MFr Bld: 5.7 % — ABNORMAL HIGH (ref 4.8–5.6)
Mean Plasma Glucose: 116.89 mg/dL

## 2022-05-08 LAB — LIPID PANEL
Cholesterol: 238 mg/dL — ABNORMAL HIGH (ref 0–200)
HDL: 109 mg/dL (ref >40)
LDL Cholesterol: 105 mg/dL — ABNORMAL HIGH (ref 0–99)
Total CHOL/HDL Ratio: 2.2 RATIO
Triglycerides: 119 mg/dL (ref <150)
VLDL: 24 mg/dL (ref 0–40)

## 2022-05-08 LAB — BASIC METABOLIC PANEL
Anion gap: 8 (ref 5–15)
BUN: 18 mg/dL (ref 8–23)
CO2: 26 mmol/L (ref 22–32)
Calcium: 8.7 mg/dL — ABNORMAL LOW (ref 8.9–10.3)
Chloride: 107 mmol/L (ref 98–111)
Creatinine, Ser: 0.82 mg/dL (ref 0.44–1.00)
GFR, Estimated: >60 mL/min (ref >60)
Glucose, Bld: 94 mg/dL (ref 70–99)
Potassium: 4 mmol/L (ref 3.5–5.1)
Sodium: 141 mmol/L (ref 135–145)

## 2022-05-08 LAB — HIV ANTIBODY (ROUTINE TESTING W REFLEX): HIV Screen 4th Generation wRfx: NONREACTIVE

## 2022-05-08 MED ORDER — SODIUM CHLORIDE 0.9 % IV BOLUS
1000.0000 mL | Freq: Once | INTRAVENOUS | Status: AC
  Administered 2022-05-08: 1000 mL via INTRAVENOUS

## 2022-05-08 MED ORDER — CLOPIDOGREL BISULFATE 75 MG PO TABS
75.0000 mg | ORAL_TABLET | Freq: Every day | ORAL | Status: DC
  Administered 2022-05-08: 75 mg via ORAL
  Filled 2022-05-08: qty 1

## 2022-05-08 MED ORDER — ASPIRIN 81 MG PO TBEC
81.0000 mg | DELAYED_RELEASE_TABLET | Freq: Every day | ORAL | Status: DC
  Administered 2022-05-08: 81 mg via ORAL
  Filled 2022-05-08: qty 1

## 2022-05-08 MED ORDER — SODIUM CHLORIDE 0.9 % IV SOLN
INTRAVENOUS | Status: DC
Start: 2022-05-08 — End: 2022-05-08

## 2022-05-08 MED ORDER — IRBESARTAN 150 MG PO TABS
150.0000 mg | ORAL_TABLET | Freq: Every day | ORAL | Status: DC
  Administered 2022-05-08: 150 mg via ORAL
  Filled 2022-05-08: qty 1

## 2022-05-08 MED ORDER — ASPIRIN 81 MG PO TBEC: 81.0000 mg | DELAYED_RELEASE_TABLET | Freq: Every day | ORAL | 12 refills | Status: DC

## 2022-05-08 MED ORDER — HYDROCHLOROTHIAZIDE 12.5 MG PO TABS
12.5000 mg | ORAL_TABLET | Freq: Every day | ORAL | Status: DC
  Administered 2022-05-08: 12.5 mg via ORAL
  Filled 2022-05-08: qty 1

## 2022-05-08 MED ORDER — AMLODIPINE BESYLATE 5 MG PO TABS
5.0000 mg | ORAL_TABLET | Freq: Every day | ORAL | Status: DC
Start: 2022-05-08 — End: 2022-05-08
  Administered 2022-05-08: 5 mg via ORAL
  Filled 2022-05-08: qty 1

## 2022-05-08 MED ORDER — CLOPIDOGREL BISULFATE 75 MG PO TABS: 75.0000 mg | ORAL_TABLET | Freq: Every day | ORAL | 1 refills | Status: DC

## 2022-05-08 MED ORDER — SODIUM CHLORIDE 0.9 % IV SOLN: INTRAVENOUS | Status: DC

## 2022-05-08 MED ORDER — ROSUVASTATIN CALCIUM 20 MG PO TABS
20.0000 mg | ORAL_TABLET | Freq: Every day | ORAL | Status: DC
  Administered 2022-05-08: 20 mg via ORAL
  Filled 2022-05-08: qty 1

## 2022-05-08 MED ORDER — TELMISARTAN-HCTZ 40-12.5 MG PO TABS: 1.0000 | ORAL_TABLET | Freq: Every day | ORAL | Status: DC

## 2022-05-08 MED ORDER — ROSUVASTATIN CALCIUM 20 MG PO TABS: 20.0000 mg | ORAL_TABLET | Freq: Every day | ORAL | 1 refills | Status: DC

## 2022-05-08 NOTE — Evaluation (Signed)
Occupational Therapy Evaluation Patient Details Name: Debbie Hatfield MRN: [id number] DOB: 28-Feb-1956 Today's Date: 05/08/2022   History of Present Illness Patient is a 66 y/o female who presents on 7/24 with speech difficulties, facial droop and left facial numbness. NIH: 5. 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  PMH includes connective tissue disease, gastroparesis, HTN, trigeminal neuralgia (left).   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 Pt does admit to having a few falls due to chronic back pain and BLEs "not wanting to work." She lives with her mother for 24/7 assist if needed. Upon evaluation pt was able to get to the EOB with mod I, however required min G for standing and min-mod A for ambulating to the bathroom due to RLE pain and several LOBs. She was furniture walking for support and still needed further assist for balance, anticipate increased safety with RW. Due to deficits listed below, pt needs min G- mod A for ADLs to prevent a fall. She will benefit from OT acutely. Recommend d/c to home with OP OT follow up as she is already active with OP PT.      Recommendations for follow up therapy are one component of a multi-disciplinary discharge planning process, led by the attending physician.  Recommendations may be updated based on patient status, additional functional criteria and insurance authorization.   Follow Up Recommendations  Outpatient OT    Assistance Recommended at Discharge Frequent or constant Supervision/Assistance  Patient can return home with the following A lot of help with walking and/or transfers;A lot of help with bathing/dressing/bathroom;Help with stairs or ramp for entrance;Assist for transportation    Functional Status Assessment  Patient has had a recent decline in their functional status and demonstrates the ability to make significant  improvements in function in a reasonable and predictable amount of time.  Equipment Recommendations  Tub/shower seat    Recommendations for Other Services       Precautions / Restrictions Precautions Precautions: Fall Precaution Comments: chronic low back pain Restrictions Weight Bearing Restrictions: No      Mobility Bed Mobility Overal bed mobility: Modified Independent                  Transfers Overall transfer level: Needs assistance Equipment used: 1 person hand held assist Transfers: Sit to/from Stand Sit to Stand: Min guard                  Balance Overall balance assessment: Needs assistance, History of Falls Sitting-balance support: Feet supported Sitting balance-Leahy Scale: Good     Standing balance support: Single extremity supported, During functional activity Standing balance-Leahy Scale: Poor Standing balance comment: min-mod A for any dynamic standing task without UE support                           ADL either performed or assessed with clinical judgement   ADL Overall ADL's : Needs assistance/impaired Eating/Feeding: Independent;Sitting   Grooming: Supervision/safety;Standing Grooming Details (indicate cue type and reason): statically standing. did require mod A for LOB when ambulating to the sink Upper Body Bathing: Set up;Sitting   Lower Body Bathing: Min guard;Sit to/from stand   Upper Body Dressing : Set up;Sitting   Lower Body Dressing: Min guard;Sit to/from stand   Toilet Transfer: Moderate assistance;Ambulation Toilet Transfer Details (indicate cue type and reason): no AD.  mod A for 1 LOB Toileting- Clothing Manipulation and Hygiene: Supervision/safety;Sitting/lateral lean       Functional mobility during ADLs: Moderate assistance;Minimal assistance General ADL Comments: limited by RLE radiating pain from chronic back issues. Pt furniture walking in the room and needed min-mod A for LOBs in the bathroom      Vision Baseline Vision/History: 1 Wears glasses Vision Assessment?: No apparent visual deficits     Perception     Praxis      Pertinent Vitals/Pain Pain Assessment Pain Assessment: Faces Faces Pain Scale: Hurts even more Pain Location: right buttock/lateral thigh Pain Descriptors / Indicators: Grimacing, Guarding, Sore Pain Intervention(s): Limited activity within patient's tolerance, Monitored during session     Hand Dominance Right   Extremity/Trunk Assessment Upper Extremity Assessment Upper Extremity Assessment: Overall WFL for tasks assessed   Lower Extremity Assessment Lower Extremity Assessment: Defer to PT evaluation   Cervical / Trunk Assessment Cervical / Trunk Assessment: Normal   Communication Communication Communication: Expressive difficulties (mild - states tongue feels "heavy")   Cognition Arousal/Alertness: Awake/alert Behavior During Therapy: WFL for tasks assessed/performed Overall Cognitive Status: Within Functional Limits for tasks assessed                                       General Comments  VSS on RA, family present. MD care team arrived at the end of the session, limited assessment due to interruption.    Exercises     Shoulder Instructions      Home Living Family/patient expects to be discharged to:: Private residence Living Arrangements: Parent Available Help at Discharge: Family Type of Home: House Home Access: Stairs to enter [employer] of Steps: threshold   Home Layout: One level     Bathroom Shower/Tub: Tub/shower unit;Walk-in shower   Bathroom Toilet: Standard     Home Equipment: Teacher, English as a foreign language (2 wheels)   Additional Comments: placed over toilet      Prior Functioning/Environment Prior Level of Function : Independent/Modified Independent             Mobility Comments: Works as Engineer, 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  Pt states she has has a few falls, and sometimes feels as if her legs "don't want to work." Needs moms assist to get up from the fround occasionally ADLs Comments: independent        OT Problem List: Decreased strength;Decreased range of motion;Decreased activity tolerance;Impaired balance (sitting and/or standing);Decreased cognition;Decreased safety awareness;Decreased knowledge of precautions;Decreased knowledge of use of DME or AE      OT Treatment/Interventions: Self-care/ADL training;Therapeutic exercise;DME and/or AE instruction;Therapeutic activities;Patient/family education;Balance training    OT Goals(Current goals can be found in the care plan section) Acute Rehab OT Goals Patient Stated Goal: home today OT Goal Formulation: With patient Time For Goal Achievement: 05/22/22 Potential to Achieve Goals: Good ADL Goals Pt Will Perform Grooming: Independently;standing Pt Will Perform Lower Body Dressing: Independently;sit to/from stand Pt Will Transfer to Toilet: Independently;ambulating;regular height toilet Additional ADL Goal #1: Pt will demonstrate increased activity tolerance to complete at least 5 OOB standing ADLs with superivison A without sitting rest break or LOB  OT Frequency: Min 2X/week    Co-evaluation              AM-PAC OT "6 Clicks" Daily Activity     Outcome Measure Help from another person eating meals?: None  Help from another person taking care of personal grooming?: A Little Help from another person toileting, which includes using toliet, bedpan, or urinal?: A Lot Help from another person bathing (including washing, rinsing, drying)?: A Lot Help from another person to put on and taking off regular upper body clothing?: A Little Help from another person to put on and taking off regular lower body clothing?: A Lot 6 Click Score: 16   End of Session Equipment Utilized During Treatment: Gait belt Nurse Communication: Mobility status  Activity  Tolerance: Patient tolerated treatment well Patient left: in bed;with call bell/phone within reach  OT Visit Diagnosis: Unsteadiness on feet (R26.81);Other abnormalities of gait and mobility (R26.89);Muscle weakness (generalized) (M62.81);History of falling (Z91.81);Pain                Time: 1937-9024 OT Time Calculation (min): 23 min Charges:  OT General Charges $OT Visit: 1 Visit OT Evaluation $OT Eval Moderate Complexity: 1 Mod    Chareese Sergent A Tiffanny Lamarche 05/08/2022, 11:07 AM

## 2022-05-08 NOTE — Progress Notes (Signed)
Dr Otelia Limes notified of blood pressure. Patient neuro exam is unchanged.

## 2022-05-08 NOTE — Evaluation (Addendum)
Speech Language Pathology Evaluation Patient Details Name: Debbie Hatfield MRN: [id number] DOB: 11/02/1955 Today's Date: 05/08/2022 Time: 6195-0932 SLP Time Calculation (min) (ACUTE ONLY): 26 min  Problem List:  Patient Active Problem List   Diagnosis Date Noted   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   Anemia, chronic disease 05/06/2022   Hyperlipidemia LDL goal <100 03/26/2022   Recurrent major depressive disorder, in partial remission (HCC) 11/15/2021   Need for shingles vaccine 11/15/2021   Visit for screening mammogram 11/15/2021   Need for vaccination 11/15/2021   GAD (generalized anxiety disorder) 11/15/2021   Stage 3a chronic kidney disease (HCC) 11/15/2021   Trigeminal neuralgia    Lupus (HCC)    Irritable bowel disease    GERD (gastroesophageal reflux disease)    Hypertension    Gastroparesis    Degeneration of lumbar intervertebral disc 01/26/2020   Chronic recurrent major depressive disorder (HCC) 06/05/2017   Osteopenia 12/14/2015   Peptic ulcer disease 12/14/2015   Past Medical History:  Past Medical History:  Diagnosis Date   Closed fracture of right distal radius    Gastroparesis    GERD (gastroesophageal reflux disease)    Hypertension    Irritable bowel disease    Lupus (HCC)    Major depression    Trigeminal neuralgia    Past Surgical History:  Past Surgical History:  Procedure Laterality Date   CHOLECYSTECTOMY     COLONOSCOPY     ORIF WRIST FRACTURE Right 09/22/2020   Procedure: OPEN REDUCTION INTERNAL FIXATION (ORIF) WRIST FRACTURE;  Surgeon: Ernest Mallick, MD;  Location: [facility];  Service: Orthopedics;  Laterality: Right;    HPI:  Pt is a 66 yo female presenting with L facial droop/numbness and slurred speech. MRI showed small R CR lacunar infarct. PMH includes:  GERD, gastroparesis, undifferentiated connective tissue disease, HTN, trigeminal neuralgia (left), IBS, lupus   Assessment / Plan / Recommendation Clinical  Impression  Pt presents with a mild dysarthria, with speech itnelligible at the conversational level but with mild errors with articulation noted. Note that she has mild L facial weakness and although she has symmetric protrusion of her tongue, she reports difficulty with moving her tongue to the L side (she demonstrates this, showing effort in bringing tongue to this side). Education was initiated about use of over-articulation as a strategy for speech with practice opportunities provided. Note that pt also completed the SLUMS, scoring 24/30, which falls into the range of mild difficulty. Mild errors were noted with delayed recall, clock drawiing, and paragraph level comprehension/recall. Given pt's highly independent baseline and work as a Engineer, civil (consulting), recommend OP SLP follow up.    SLP Assessment  SLP Recommendation/Assessment: All further Speech Lanaguage Pathology  needs can be addressed in the next venue of care SLP Visit Diagnosis: Dysarthria and anarthria (R47.1);Cognitive communication deficit (R41.841)    Recommendations for follow up therapy are one component of a multi-disciplinary discharge planning process, led by the attending physician.  Recommendations may be updated based on patient status, additional functional criteria and insurance authorization.    Follow Up Recommendations  Outpatient SLP    Assistance Recommended at Discharge  Intermittent Supervision/Assistance  Functional Status Assessment Patient has had a recent decline in their functional status and demonstrates the ability to make significant improvements in function in a reasonable and predictable amount of time.  Frequency and Duration           SLP Evaluation Cognition  Overall Cognitive Status: Impaired/Different from baseline Arousal/Alertness:  Awake/alert Orientation Level: Oriented X4 Attention: Sustained Sustained Attention: Appears intact Memory: Impaired Memory Impairment: Retrieval deficit Awareness:  Appears intact Problem Solving: Appears intact Safety/Judgment: Appears intact       Comprehension  Auditory Comprehension Overall Auditory Comprehension: Appears within functional limits for tasks assessed    Expression Expression Primary Mode of Expression: Verbal Verbal Expression Overall Verbal Expression: Appears within functional limits for tasks assessed Written Expression Dominant Hand: Right   Oral / Motor  Oral Motor/Sensory Function Overall Oral Motor/Sensory Function: Mild impairment Facial ROM: Reduced left;Suspected CN VII (facial) dysfunction Facial Symmetry: Abnormal symmetry left;Suspected CN VII (facial) dysfunction Lingual ROM: Reduced left Lingual Symmetry: Within Functional Limits Motor Speech Overall Motor Speech: Impaired Respiration: Within functional limits Phonation: Normal Resonance: Within functional limits Articulation: Impaired Level of Impairment: Conversation Intelligibility: Intelligible Motor Planning: Witnin functional limits Motor Speech Errors: Not applicable            Mahala Menghini., M.A. CCC-SLP Acute Rehabilitation Services Office [phone]  Secure chat preferred  05/08/2022, 2:41 PM

## 2022-05-08 NOTE — TOC Transition Note (Signed)
Transition of Care [facility]) - CM/SW Discharge Note   Patient Details  Name: GOLDY CALANDRA MRN: [id number] Date of Birth: June 19, 1956  [facility]) CM/SW Contact:  Glennon Mac, RN Phone Number: 05/08/2022, 1:54 PM   Clinical Narrative:    Patient is a 66 y/o female who presents on 7/24 with speech difficulties, facial droop and left facial numbness. NIH: 5. 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   PTA, pt independent and working; she lives at home with her mother, who is able to provide needed assistance at home.  PT/OT and ST recommending OP follow up, and patient agreeable. Referral to [facility] OP Neuro-Rehab for continued therapies.  Referral to [facility] for RW, to be delivered to bedside prior to dc.    Final next level of care: OP Rehab Barriers to Discharge: Barriers Resolved                         Discharge Plan and Services   Discharge Planning Services: CM Consult            DME Arranged: Dan Humphreys rolling   Date DME Agency Contacted: 05/08/22 Time DME Agency Contacted: 1315 Representative spoke with at DME Agency: Mayra Reel            Social Determinants of Health (SDOH) Interventions     Readmission Risk Interventions     No data to display         Quintella Baton, RN, BSN  Trauma/Neuro ICU Case Manager [phone]

## 2022-05-08 NOTE — Evaluation (Signed)
Physical Therapy Evaluation Patient Details Name: Debbie Hatfield MRN: [id number] DOB: 05-Sep-1956 Today's Date: 05/08/2022  History of Present Illness  Patient is a 66 y/o female who presents on 7/24 with speech difficulties, facial droop and left facial numbness. NIH: 5. 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  PMH includes connective tissue disease, gastroparesis, HTN, trigeminal neuralgia (left).  Clinical Impression  Patient presents with speech difficulties, decreased facial sensation and pain s/p above impacting mobility. Pt is independent PTA and lives with her mother; works as a Engineer, civil (consulting) at [employer]. Today, pt reports increased pain in right lower back/hip resulting in difficulty with gait requiring Min-Mod A for balance and to advance RLE. Reports speech is improving but still not at baseline. Session limited due to interruption of NPs. Pt reports hx of falls due to chronic back pain/arthritis, which she gets injections for. Will follow acutely to maximize independence and mobility as well as further gait training to determine if pt needs DME for home safety.      Recommendations for follow up therapy are one component of a multi-disciplinary discharge planning process, led by the attending physician.  Recommendations may be updated based on patient status, additional functional criteria and insurance authorization.  Follow Up Recommendations Outpatient PT (neuro)      Assistance Recommended at Discharge Intermittent Supervision/Assistance  Patient can return home with the following  A little help with walking and/or transfers;A little help with bathing/dressing/bathroom;Help with stairs or ramp for entrance;Assistance with cooking/housework    Equipment Recommendations None recommended by PT  Recommendations for Other Services       Functional Status Assessment Patient has had a recent decline in their functional status and demonstrates the ability to make significant  improvements in function in a reasonable and predictable amount of time.     Precautions / Restrictions Precautions Precautions: Fall;Other (comment) Precaution Comments: chronic low back pain Restrictions Weight Bearing Restrictions: No      Mobility  Bed Mobility Overal bed mobility: Modified Independent             General bed mobility comments: No assist needed.    Transfers Overall transfer level: Needs assistance Equipment used: 1 person hand held assist Transfers: Sit to/from Stand Sit to Stand: Min guard           General transfer comment: Min guard for safety. Difficulty getting upright and slow to rise, hx of chronic back pain. Stood from [employer], from toilet x1.    Ambulation/Gait Ambulation/Gait assistance: Min assist, Mod assist Gait Distance (Feet): 12 Feet (+ 15') Assistive device: 1 person hand held assist Gait Pattern/deviations: Step-to pattern, Decreased step length - right, Narrow base of support Gait velocity: decreased Gait velocity interpretation: <1.31 ft/sec, indicative of household ambulator   General Gait Details: Slow, unsteady gait with difficulty advancing RLE resulting in pt reaching forward to grab sink and missing counter needing Mod A for support, Needs UE support at this time for balance, might try RW. Stiffness noted in back/right hip.  Stairs            Wheelchair Mobility    Modified Rankin (Stroke Patients Only) Modified Rankin (Stroke Patients Only) Pre-Morbid Rankin Score: No significant disability Modified Rankin: Moderately severe disability     Balance Overall balance assessment: Needs assistance, History of Falls Sitting-balance support: Feet supported, No upper extremity supported Sitting balance-Leahy Scale: Good     Standing balance support: During functional activity Standing balance-Leahy Scale: Poor Standing balance comment: min-mod  A for any dynamic standing task without UE support                              Pertinent Vitals/Pain Pain Assessment Pain Assessment: Faces Faces Pain Scale: Hurts even more Pain Location: right buttock/lateral thigh Pain Descriptors / Indicators: Grimacing, Guarding, Sore Pain Intervention(s): Monitored during session, Repositioned, Limited activity within patient's tolerance    Home Living Family/patient expects to be discharged to:: Private residence Living Arrangements: Parent (mom) Available Help at Discharge: Family Type of Home: House Home Access: Stairs to enter   Secretary/administrator of Steps: threshold   Home Layout: One level Home Equipment: Teacher, English as a foreign language (2 wheels) Additional Comments: placed over toilet    Prior Function Prior Level of Function : Independent/Modified Independent             Mobility Comments: Works as Engineer, 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 Pt states she has has a few falls, and sometimes feels as if her legs "don't want to work." Needs moms assist to get up from the ground occasionally ADLs Comments: independent     Hand Dominance   Dominant Hand: Right    Extremity/Trunk Assessment   Upper Extremity Assessment Upper Extremity Assessment: Defer to OT evaluation    Lower Extremity Assessment Lower Extremity Assessment: RLE deficits/detail RLE Deficits / Details: Stiff like movement esp with hip flexion/advancement of LE during walking    Cervical / Trunk Assessment Cervical / Trunk Assessment: Normal  Communication   Communication: Expressive difficulties (mild, states her tongue is swollen)  Cognition Arousal/Alertness: Awake/alert Behavior During Therapy: WFL for tasks assessed/performed Overall Cognitive Status: Within Functional Limits for tasks assessed                                          General Comments General comments (skin integrity, edema, etc.): VSS on RA, family present during  session. Limited evaluation due to interruption.    Exercises     Assessment/Plan    PT Assessment Patient needs continued PT services  PT Problem List Decreased strength;Decreased mobility;Decreased range of motion;Pain;Decreased balance;Decreased activity tolerance       PT Treatment Interventions Therapeutic activities;DME instruction;Gait training;Therapeutic exercise;Stair training;Balance training;Neuromuscular re-education;Patient/family education    PT Goals (Current goals can be found in the Care Plan section)  Acute Rehab PT Goals Patient Stated Goal: to go home today PT Goal Formulation: With patient Time For Goal Achievement: 05/22/22 Potential to Achieve Goals: Good    Frequency Min 4X/week     Co-evaluation               AM-PAC PT "6 Clicks" Mobility  Outcome Measure Help needed turning from your back to your side while in a flat bed without using bedrails?: None Help needed moving from lying on your back to sitting on the side of a flat bed without using bedrails?: None Help needed moving to and from a bed to a chair (including a wheelchair)?: A Little Help needed standing up from a chair using your arms (e.g., wheelchair or bedside chair)?: A Little Help needed to walk in hospital room?: Total Help needed climbing 3-5 steps with a railing? : A Lot 6 Click Score: 17    End of Session Equipment Utilized During Treatment: Gait belt Activity Tolerance: Patient limited  by pain Patient left: in bed;with call bell/phone within reach;Other (comment);with family/visitor present (with multiple MDs present) Nurse Communication: Mobility status PT Visit Diagnosis: Pain;Difficulty in walking, not elsewhere classified (R26.2);Unsteadiness on feet (R26.81) Pain - Right/Left: Right Pain - part of body: Hip (back)    Time: 2585-2778 PT Time Calculation (min) (ACUTE ONLY): 23 min   Charges:   PT Evaluation $PT Eval Moderate Complexity: 1 Mod           Vale Haven, PT, DPT Acute Rehabilitation Services Secure chat preferred Office [phone]     Blake Divine A Breckin [place] 05/08/2022, 12:16 PM

## 2022-05-08 NOTE — Progress Notes (Signed)
   05/08/22 1445  Clinical Encounter Type  Visited With Patient and family together  Visit Type Initial;Spiritual support  Referral From Nurse  Consult/Referral To Chaplain   Chaplain visited with patient as I was rounding on the floor. The patient, Debbie Hatfield, expressed that she was doing much better than the prior day. I shared that her co- workers were concerned for her and she is in our prayers.  Debbie Hatfield is looking forward to returning home today and resting in familiar surroundings.  I wished her well as I departed.   Valerie Roys [facility]  [phone]

## 2022-05-08 NOTE — Discharge Summary (Addendum)
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   acyclovir 400 MG tablet Commonly known as: ZOVIRAX Take 1 tablet (400 mg total) by mouth daily.   amLODipine 5 MG tablet Commonly known as: NORVASC Take 1 tablet (5 mg total) by mouth daily.   aspirin EC 81 MG tablet Take 1 tablet (81 mg total) by mouth daily. Swallow whole. Start taking on: May 09, 2022   B-12 PO Take 1 tablet by mouth daily.   carbamazepine 300 MG 12 hr capsule Commonly known as: CARBATROL Take 1 capsule (300 mg total) by mouth daily.   clopidogrel 75 MG tablet Commonly known as: PLAVIX Take 1 tablet (75 mg total) by mouth daily. Start taking on: May 09, 2022   D3 PO Take 1 capsule by mouth daily.   esomeprazole 40 MG capsule Commonly known as: NEXIUM Take 40 mg by mouth Nightly.   gabapentin 300 MG capsule Commonly known as: NEURONTIN Take 600 mg by mouth 3 (three) times daily.   hydrOXYzine 10 MG tablet Commonly known as: ATARAX Take 1 tablet (10 mg total) by mouth every 8 (eight) hours as needed. What changed: reasons to take this   predniSONE 5 MG tablet Commonly known as: DELTASONE Take 5-15 mg by mouth daily.   rosuvastatin 20 MG tablet Commonly known as: CRESTOR Take 1  tablet (20 mg total) by mouth daily.   telmisartan-hydrochlorothiazide 40-12.5 MG tablet Commonly known as: MICARDIS HCT TAKE 1 TABLET BY MOUTH DAILY   tizanidine 2 MG capsule Commonly known as: ZANAFLEX Take 4 mg by mouth at bedtime.   traMADol 50 MG tablet Commonly known as: ULTRAM Take 100 mg by mouth 3 (three) times daily.   venlafaxine XR 37.5 MG 24 hr capsule Commonly known as: Effexor XR Take 2 capsules (75 mg total) by mouth daily with breakfast. What changed: when to take this        LABORATORY STUDIES CBC    Component Value Date/Time   WBC 5.3 05/08/2022 0424   RBC 3.69 (L) 05/08/2022 0424   HGB 10.9 (L) 05/08/2022 0424   HCT 33.4 (L) 05/08/2022 0424   PLT 294 05/08/2022 0424   MCV 90.5 05/08/2022 0424   MCH 29.5 05/08/2022 0424   MCHC 32.6 05/08/2022 0424   RDW 15.0 05/08/2022 0424   LYMPHSABS 2.5 05/07/2022 0905   MONOABS 1.2 (H) 05/07/2022 0905   EOSABS 0.1 05/07/2022 0905   BASOSABS 0.0 05/07/2022 0905   CMP    Component Value Date/Time   NA 141 05/08/2022 0424   K 4.0 05/08/2022 0424   CL 107 05/08/2022 0424   CO2 26 05/08/2022 0424   GLUCOSE  94 05/08/2022 0424   BUN 18 05/08/2022 0424   CREATININE 0.82 05/08/2022 0424   CALCIUM 8.7 (L) 05/08/2022 0424   PROT 6.6 05/07/2022 0905   ALBUMIN 3.9 05/07/2022 0905   AST 20 05/07/2022 0905   ALT 18 05/07/2022 0905   ALKPHOS 68 05/07/2022 0905   BILITOT 0.7 05/07/2022 0905   GFRNONAA >60 05/08/2022 0424   GFRAA >90 07/07/2012 0610   COAGS Lab Results  Component Value Date   INR 0.9 05/07/2022   Lipid Panel    Component Value Date/Time   CHOL 238 (H) 05/08/2022 0424   TRIG 119 05/08/2022 0424   HDL 109 05/08/2022 0424   CHOLHDL 2.2 05/08/2022 0424   VLDL 24 05/08/2022 0424   LDLCALC 105 (H) 05/08/2022 0424   HgbA1C  Lab Results  Component Value Date   HGBA1C 5.7 (H) 05/08/2022   Urinalysis    Component Value Date/Time   COLORURINE COLORLESS (A) 05/07/2022 0851   APPEARANCEUR  CLEAR 05/07/2022 0851   LABSPEC 1.029 05/07/2022 0851   PHURINE 6.0 05/07/2022 0851   GLUCOSEU NEGATIVE 05/07/2022 0851   HGBUR NEGATIVE 05/07/2022 0851   BILIRUBINUR NEGATIVE 05/07/2022 0851   KETONESUR NEGATIVE 05/07/2022 0851   PROTEINUR NEGATIVE 05/07/2022 0851   UROBILINOGEN 0.2 07/07/2012 0720   NITRITE NEGATIVE 05/07/2022 0851   LEUKOCYTESUR NEGATIVE 05/07/2022 0851   Urine Drug Screen     Component Value Date/Time   LABOPIA NONE DETECTED 05/07/2022 1056   COCAINSCRNUR NONE DETECTED 05/07/2022 1056   LABBENZ NONE DETECTED 05/07/2022 1056   AMPHETMU NONE DETECTED 05/07/2022 1056   THCU NONE DETECTED 05/07/2022 1056   LABBARB NONE DETECTED 05/07/2022 1056    Alcohol Level    Component Value Date/Time   ETH <10 05/07/2022 0905     SIGNIFICANT DIAGNOSTIC STUDIES CT HEAD WO CONTRAST ( )  Result Date: 05/08/2022 CLINICAL DATA:  Follow-up stroke EXAM: CT HEAD WITHOUT CONTRAST TECHNIQUE: Contiguous axial images were obtained from the base of the skull through the vertex without intravenous contrast. RADIATION DOSE REDUCTION: This exam was performed according to the departmental dose-optimization program which includes automated exposure control, adjustment of the mA and/or kV according to patient size and/or use of iterative reconstruction technique. COMPARISON:  Head CT dated April 07, 2022 FINDINGS: Brain: Small evolving infarct of the right posterior frontal white matter. No evidence of hemorrhage, hydrocephalus, extra-axial collection or mass lesion/mass effect. Vascular: No hyperdense vessel or unexpected calcification. Skull: Normal. Negative for fracture or focal lesion. Sinuses/Orbits: No acute finding. Other: None. IMPRESSION: Small evolving infarct of the right posterior frontal white matter. Electronically Signed   By: Allegra Lai M.D.   On: 05/08/2022 08:16   ECHOCARDIOGRAM COMPLETE  Result Date: 05/07/2022    ECHOCARDIOGRAM REPORT   Patient Name:   Debbie Hatfield  Date of Exam: 05/07/2022 Medical Rec #:  [id number]      Height:       62.0 in Accession #:    [id number]     Weight:       145.1 lb Date of Birth:  Jan 29, 1956       BSA:          1.668 m Patient Age:    66 years       BP:           167/80 mmHg Patient Gender: F              HR:           57 bpm.  Exam Location:  Inpatient Procedure: 2D Echo, Cardiac Doppler and Color Doppler Indications:    Stroke I63.9  History:        Patient has prior history of Echocardiogram examinations, most                 recent 01/31/2021. Risk Factors:Hypertension.  Sonographer:    Ronny Flurry Sonographer#2:  Bernadene Person RDCS Referring Phys: [id number] [place]  1. Left ventricular ejection fraction, by estimation, is 60 to 65%. The left ventricle has normal function. The left ventricle has no regional wall motion abnormalities. Left ventricular diastolic parameters were normal.  2. Right ventricular systolic function is normal. The right ventricular size is normal. There is normal pulmonary artery systolic pressure.  3. Left atrial size was mildly dilated.  4. The mitral valve is abnormal. Mild mitral valve regurgitation. No evidence of mitral stenosis.  5. The aortic valve is tricuspid. There is mild calcification of the aortic valve. There is mild thickening of the aortic valve. Aortic valve regurgitation is trivial. Aortic valve sclerosis is present, with no evidence of aortic valve stenosis.  6. The inferior vena cava is normal in size with greater than 50% respiratory variability, suggesting right atrial pressure of 3 mmHg. FINDINGS  Left Ventricle: Left ventricular ejection fraction, by estimation, is 60 to 65%. The left ventricle has normal function. The left ventricle has no regional wall motion abnormalities. The left ventricular internal cavity size was normal in size. There is  no left ventricular hypertrophy. Left ventricular diastolic parameters were normal. Right Ventricle: The right ventricular size is  normal. No increase in right ventricular wall thickness. Right ventricular systolic function is normal. There is normal pulmonary artery systolic pressure. The tricuspid regurgitant velocity is 2.20 m/s, and  with an assumed right atrial pressure of 3 mmHg, the estimated right ventricular systolic pressure is [id number] mmHg. Left Atrium: Left atrial size was mildly dilated. Right Atrium: Right atrial size was normal in size. Pericardium: There is no evidence of pericardial effusion. Mitral Valve: The mitral valve is abnormal. There is mild thickening of the mitral valve leaflet(s). There is mild calcification of the mitral valve leaflet(s). Mild mitral annular calcification. Mild mitral valve regurgitation. No evidence of mitral valve stenosis. Tricuspid Valve: The tricuspid valve is normal in structure. Tricuspid valve regurgitation is mild . No evidence of tricuspid stenosis. Aortic Valve: The aortic valve is tricuspid. There is mild calcification of the aortic valve. There is mild thickening of the aortic valve. Aortic valve regurgitation is trivial. Aortic valve sclerosis is present, with no evidence of aortic valve stenosis. Pulmonic Valve: The pulmonic valve was normal in structure. Pulmonic valve regurgitation is not visualized. No evidence of pulmonic stenosis. Aorta: The aortic root is normal in size and structure. Venous: The inferior vena cava is normal in size with greater than 50% respiratory variability, suggesting right atrial pressure of 3 mmHg. IAS/Shunts: No atrial level shunt detected by color flow Doppler.  LEFT VENTRICLE PLAX 2D LVIDd:         4.00 cm     Diastology LVIDs:         2.20 cm     LV e' medial:    6.57 cm/s LV PW:         1.00 cm     LV E/e' medial:  13.4 LV IVS:        0.80 cm     LV e' lateral:   7.98 cm/s LVOT diam:  1.90 cm     LV E/e' lateral: 11.0 LV SV:         72 LV SV Index:   43 LVOT Area:     2.84 cm  LV Volumes (MOD) LV vol d, MOD A2C: 83.2 ml LV vol d, MOD A4C: 83.7 ml LV  vol s, MOD A2C: 30.9 ml LV vol s, MOD A4C: 33.3 ml LV SV MOD A2C:     52.3 ml LV SV MOD A4C:     83.7 ml LV SV MOD BP:      50.3 ml RIGHT VENTRICLE RV S prime:     11.90 cm/s TAPSE (M-mode): 2.0 cm LEFT ATRIUM             Index        RIGHT ATRIUM           Index LA diam:        3.80 cm 2.28 cm/m   RA Area:     10.10 cm LA Vol (A2C):   56.5 ml 33.88 ml/m  RA Volume:   18.40 ml  11.03 ml/m LA Vol (A4C):   39.3 ml 23.56 ml/m LA Biplane Vol: 49.4 ml 29.62 ml/m  AORTIC VALVE LVOT Vmax:   96.30 cm/s LVOT Vmean:  71.700 cm/s LVOT VTI:    0.254 m  AORTA Ao Root diam: 3.00 cm Ao Asc diam:  3.00 cm MITRAL VALVE               TRICUSPID VALVE MV Area (PHT): 3.31 cm    TR Peak grad:   19.4 mmHg MV Decel Time: 229 msec    TR Vmax:        220.00 cm/s MV E velocity: 88.10 cm/s MV A velocity: 96.70 cm/s  SHUNTS MV E/A ratio:  0.91        Systemic VTI:  0.25 m                            Systemic Diam: 1.90 cm Jenkins Rouge MD Electronically signed by Jenkins Rouge MD Signature Date/Time: 05/07/2022/11:50:43 AM    Final    MR BRAIN WO CONTRAST  Result Date: 05/07/2022 CLINICAL DATA:  Stroke, follow up EXAM: MRI HEAD WITHOUT CONTRAST TECHNIQUE: Multiplanar, multiecho pulse sequences of the brain and surrounding structures were obtained without intravenous contrast. COMPARISON:  Same day CT head and CTA head/neck. FINDINGS: Brain: Small acute infarct in the right posterior frontal white matter. Slight edema without mass effect. Moderate scattered T2/FLAIR hyperintensities within the white matter. No evidence of acute hemorrhage, mass lesion, midline shift or hydrocephalus. Vascular: Major arterial flow voids are maintained at the skull base Skull and upper cervical spine: Normal marrow signal. Sinuses/Orbits: Visualized sinuses are clear. No acute orbital findings. Other: No mastoid effusions. IMPRESSION: 1. Small acute infarct in the right posterior frontal white matter. 2. Moderate scattered T2/FLAIR hyperintensities the  white matter, which are nonspecific but most likely due to chronic microvascular ischemic disease given the patient's risk factors. Chronic demyelination is thought less likely. Electronically Signed   By: Margaretha Sheffield M.D.   On: 05/07/2022 09:38   CT ANGIO HEAD NECK W WO CM W PERF (CODE STROKE)  Result Date: 05/07/2022 CLINICAL DATA:  Neuro deficit, acute, stroke suspected. EXAM: CT ANGIOGRAPHY HEAD AND NECK TECHNIQUE: Multidetector CT imaging of the head and neck was performed using the standard protocol during bolus administration of intravenous contrast. Multiplanar CT image reconstructions and  MIPs were obtained to evaluate the vascular anatomy. Carotid stenosis measurements (when applicable) are obtained utilizing NASCET criteria, using the distal internal carotid diameter as the denominator. RADIATION DOSE REDUCTION: This exam was performed according to the departmental dose-optimization program which includes automated exposure control, adjustment of the mA and/or kV according to patient size and/or use of iterative reconstruction technique. CONTRAST:  173mL OMNIPAQUE IOHEXOL 350 MG/ML SOLN COMPARISON:  Same day CT head. FINDINGS: CTA NECK FINDINGS Aortic arch: Great vessel origins are patent. Right carotid system: No evidence of dissection, stenosis (50% or greater) or occlusion. Left carotid system: No evidence of dissection, stenosis (50% or greater) or occlusion. Vertebral arteries: Reflux of contrast into the venous vessels in the neck results in streak artifact and significantly limits evaluation the vertebral arteries. The reflux of contrast in the neck is likely due to narrowing of the brachiocephalic vein subjacent to the sternum. Within this limitation, both vertebral arteries appear patent without visible high-grade stenosis. Skeleton: No acute fracture. Other neck: 2.0 cm right thyroid nodule. Upper chest: Visualized lung apices are clear. Review of the MIP images confirms the above  findings CTA HEAD FINDINGS Anterior circulation: Bilateral intracranial ICAs, MCAs and ACAs are patent without proximal hemodynamically significant stenosis. Posterior circulation: Bilateral intradural vertebral arteries, basilar artery, and both posterior cerebral arteries are patent without proximal hemodynamically significant stenosis. Venous sinuses: As permitted by contrast timing, patent. Review of the MIP images confirms the above findings IMPRESSION: 1. No emergent large vessel occlusion. 2. No visible hemodynamically significant stenosis in the neck with limited assessment of the vertebral arteries due to reflux of venous contrast into the neck. 3. Approximately 2.0 cm right thyroid nodule. Recommend thyroid ultrasound (ref: J Am Coll Radiol. 2015 Feb;12(2): 143-50). Electronically Signed   By: Margaretha Sheffield M.D.   On: 05/07/2022 09:26   CT HEAD CODE STROKE WO CONTRAST  Result Date: 05/07/2022 CLINICAL DATA:  Code stroke. Neuro deficit, acute, stroke suspected. EXAM: CT HEAD WITHOUT CONTRAST TECHNIQUE: Contiguous axial images were obtained from the base of the skull through the vertex without intravenous contrast. RADIATION DOSE REDUCTION: This exam was performed according to the departmental dose-optimization program which includes automated exposure control, adjustment of the mA and/or kV according to patient size and/or use of iterative reconstruction technique. COMPARISON:  03/26/2022 FINDINGS: Brain: No acute intracranial finding. Question old small left parietal cortical infarction as seen previously. No mass, hemorrhage, hydrocephalus or extra-axial collection. Aided Vascular: Minimal calcification in the carotid siphon regions. Otherwise negative. No hyperdense vessel identified. Skull: Normal Sinuses/Orbits: Clear/normal Other: None ASPECTS ([place] Stroke Program Early CT Score) - Ganglionic level infarction (caudate, lentiform nuclei, internal capsule, insula, M1-M3 cortex): 7 -  Supraganglionic infarction (M4-M6 cortex): 3 Total score (0-10 with 10 being normal): 10 IMPRESSION: 1. No acute CT finding. Question old small left parietal cortical infarction, unchanged from prior exams. 2. ASPECTS is 10. 3. These results were communicated to Dr. Erlinda Hong at 9:05 am on 05/07/2022 by text page via the [facility] messaging system. Electronically Signed   By: Nelson Chimes M.D.   On: 05/07/2022 09:06   CUP PACEART REMOTE DEVICE CHECK  Result Date: 04/20/2022 ILR summary report received. Battery status OK. Normal device function. No new symptom, tachy, brady, or pause episodes. No new AF episodes. Monthly summary reports and ROV/PRN     HISTORY OF PRESENT ILLNESS ABIOLA KUEHNLE is a 66 y.o. female with a past medical hisotry of undifferentiated connective tissue disease, GERD, gastroparesis, HTN, trigeminal neuralgia (  left), and IBS. She went to bed at 2230 last night, woke up this morning around 6am seemed felt some unusual feeling on the left face but not much she could tell. When she drove to work at 7:30am she felt left face numbness, and she noted left facial droop. At work she had dysarthria, left facial droop, and a left sensory deficit. She also reported some tongue thickness feeling, difficulty swallowing and difficulty getting words out   HOSPITAL COURSE Stroke: Small acute infarct in the right posterior frontal lobe s/p TNK per wake up protocol, etiology likely small vessel disease  Code Stroke CT head No acute abnormality.  CTA head & neck No LVO CT perfusion No  core or penumbra identified MRI Small acute infarct in the right posterior frontal white matter. Moderate scattered T2/FLAIR hyperintensities the white matter, which are nonspecific but most likely due to chronic microvascular ischemic disease given the patient's risk factors. Chronic demyelination is thought less likely. 2D Echo EF 60-65% LDL 105 HgbA1c 5.7 VTE prophylaxis - SCDs No antithrombotic prior to admission, now on  aspirin 81 mg daily and clopidogrel 75 mg daily for 3 weeks and then ASA 81mg  alone Therapy recommendations:  Outpatient follow up Disposition:  Home   Undifferentiated connective disease disease Follow-up with outpatient rheumatology, concerning for fibromyalgia Unspecific symptoms including numbness, whole body pain, tiredness MRI with multiple white matter changes Not very consistent with MS but will refer to Dr. Felecia Shelling for evaluation.  Hypertension Home meds:  amlodipine, telmisartan- hydrochlorothiazide Normotensive is long term goal  Hyperlipidemia Home meds:  None LDL 105, goal < 70 Add crestor 20mg   Continue statin at discharge  Other Stroke Risk Factors Advanced Age >/= 45  Coronary artery disease Migraines  Other Active Problems Hx of Trigeminal Neuralgia affecting left side of face, on Tegretol, continue on discharge Depression- continue home medications   DISCHARGE EXAM Blood pressure (!) 159/71, pulse 65, temperature 98.1 F (36.7 C), temperature source Oral, resp. rate 16, height 5\' 2"  (1.575 m), weight 65.8 kg, SpO2 99 %.  GENERAL: Awake, alert in NAD LUNGS - Clear to auscultation bilaterally with no wheezes CV - S1S2 RRR, no m/r/g, equal pulses bilaterally.   NEURO:  Mental Status: AA&Ox3  Language: speech is clear.  Naming, repetition and comprehension intact. Slow on speech, but no aphasia.  Cranial Nerves: PERRL EOMI, visual fields full, mild left facial droop, left facial sensation mildly decreased comparing with right, hearing intact, tongue/uvula/soft palate midline, normal sternocleidomastoid and trapezius muscle strength. No evidence of tongue atrophy or fibrillations Mild left facial droop   Motor: Strength is 5/5 in all extremities Tone: is normal and bulk is normal Sensation- Intact to light touch bilaterally Coordination: FTN intact bilaterally, no ataxia in BLE. Gait- deferred  Discharge Diet       Diet   Diet regular Room service  appropriate? Yes; Fluid consistency: Thin   liquids  DISCHARGE PLAN Disposition:  Home with outpatient OT/PT aspirin 81 mg daily and clopidogrel 75 mg daily for secondary stroke prevention for 3 weeks then asprin alone. Ongoing stroke risk factor control by Primary Care Physician at time of discharge Follow-up PCP Janith Lima, MD in 2 weeks. Follow-up in [facility] in 8 weeks- follow up with Dr. Felecia Shelling  35 minutes were spent preparing discharge.  Patient seen and examined by NP/APP with MD. MD to update note as needed.   Janine Ores, DNP, FNP-BC [facility] Pager: [phone]  ATTENDING NOTE:  I reviewed above note and agree with the assessment and plan. Pt was seen and examined.   2 family members at the bedside.  Patient working with PT/OT doing the time of rounding.  Still has mild left facial droop and mild slurred speech but improved from yesterday.  CT at 24 hours no bleeding.  Put on DAPT for 3 weeks and then aspirin alone.  Recommend to follow-up with Dr. Epimenio Foot at [facility] for MS evaluation although not a typical case.  Continue Tegretol for left trigeminal neuralgia.  PT/OT recommend outpatient therapy.  Patient will be discharged in good condition.  For detailed assessment and plan, please refer to above/below as I have made changes wherever appropriate.   Marvel Plan, MD PhD Stroke Neurology 05/08/2022 6:09 PM  ADDENDEM Pt admitted for right CR stroke, received TNK for acute stroke treatment. Her symptoms improved significantly the second day and much quicker than expected. Her stroke work up was done and I do not think she needed to remain in hospital for further stroke treatment. PT/OT recommended outpt PT/OT given the improvement. Therefore, she was discharged home in good condition with neurology outpt follow up.   Marvel Plan, MD PhD Stroke Neurology 05/15/2022 10:44 AM   To contact Stroke Continuity provider, please  refer to [email or web address]. After hours, contact General Neurology

## 2022-05-08 NOTE — Progress Notes (Signed)
PT Cancellation Note  Patient Details Name: SAACHI ZALE MRN: [id number] DOB: 1956-09-22   Cancelled Treatment:    Reason Eval/Treat Not Completed: Active bedrest order Will await increase in activity orders prior to PT evaluation. Will follow.   Blake Divine A Serai Tukes 05/08/2022, 6:57 AM [place], PT, DPT Acute Rehabilitation Services Secure chat preferred Office [phone]

## 2022-05-08 NOTE — Progress Notes (Signed)
Pt d/c with mom an bedside, rolling walker delivered to pt's room, vs wnl, PIV d/c and pressure dressing applied. Reviewed d/c instructions with pt, no concerns noted.

## 2022-05-08 NOTE — Progress Notes (Signed)
Physical Therapy Treatment Patient Details Name: Debbie Hatfield MRN: [id number] DOB: 1956/03/25 Today's Date: 05/08/2022   History of Present Illness Patient is a 66 y/o female who presents on 7/24 with speech difficulties, facial droop and left facial numbness. NIH: 5. 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  PMH includes connective tissue disease, gastroparesis, HTN, trigeminal neuralgia (left).    PT Comments    Patient progressing well towards PT goals for second PT session for gait training to prepare pt for d/c home. Tolerated gait training with HHA for support (Mod A at times) progressing to ambulation with use of RW and Min guard-supervision for safety. Able to progress to step through gait with increased distance and cues with RLE better able to advance during swing phase. Encouraged use of RW for home for safe ambulation and to decrease fall risk. Continues to report pain in right hip/back described as sharp, stabbing pain. Eager to see SLP prior to d/c. Will follow.   Recommendations for follow up therapy are one component of a multi-disciplinary discharge planning process, led by the attending physician.  Recommendations may be updated based on patient status, additional functional criteria and insurance authorization.  Follow Up Recommendations  Outpatient PT     Assistance Recommended at Discharge Set up Supervision/Assistance  Patient can return home with the following A little help with walking and/or transfers;A little help with bathing/dressing/bathroom;Help with stairs or ramp for entrance;Assistance with cooking/housework   Equipment Recommendations  Rolling walker (2 wheels)    Recommendations for Other Services       Precautions / Restrictions Precautions Precautions: None Precaution Comments: chronic low back pain Restrictions Weight Bearing Restrictions: No     Mobility  Bed Mobility Overal bed mobility: Modified Independent              General bed mobility comments: No assist needed.    Transfers Overall transfer level: Needs assistance Equipment used: None Transfers: Sit to/from Stand Sit to Stand: Min guard           General transfer comment: Min guard for safety. Difficulty getting upright with sharp pains initially, slow to rise, stood from EOB x1.    Ambulation/Gait Ambulation/Gait assistance: Min guard, Supervision Gait Distance (Feet): 175 Feet Assistive device: Rolling walker (2 wheels), 1 person hand held assist Gait Pattern/deviations: Step-to pattern, Decreased step length - right, Narrow base of support, Step-through pattern, Decreased stride length, Trunk flexed Gait velocity: decreased Gait velocity interpretation: <1.31 ft/sec, indicative of household ambulator   General Gait Details: Initially slow step to and guarded gait with HHA progressing to step through gait with RW, some jerking noted with sharp pain during RLE advancement, improved with increased distance.   Stairs             Wheelchair Mobility    Modified Rankin (Stroke Patients Only) Modified Rankin (Stroke Patients Only) Pre-Morbid Rankin Score: No significant disability Modified Rankin: Moderate disability     Balance Overall balance assessment: Needs assistance, History of Falls Sitting-balance support: Feet supported, No upper extremity supported Sitting balance-Leahy Scale: Good     Standing balance support: During functional activity Standing balance-Leahy Scale: Fair Standing balance comment: Does better with BUE support and RW                            Cognition Arousal/Alertness: Awake/alert Behavior During Therapy: WFL for tasks assessed/performed Overall Cognitive Status: Within Functional Limits for tasks assessed  Exercises      General Comments General comments (skin integrity, edema, etc.): Mother present during  session.      Pertinent Vitals/Pain Pain Assessment Pain Assessment: Faces Faces Pain Scale: Hurts even more Pain Location: right buttock/lateral thigh Pain Descriptors / Indicators: Guarding, Grimacing, Stabbing, Sharp Pain Intervention(s): Monitored during session, Patient requesting pain meds-RN notified, Repositioned    Home Living Family/patient expects to be discharged to:: Private residence Living Arrangements: Parent (mom) Available Help at Discharge: Family Type of Home: House Home Access: Stairs to enter   Secretary/administrator of Steps: threshold   Home Layout: One level Home Equipment: Teacher, English as a foreign language (2 wheels) Additional Comments: placed over toilet    Prior Function            PT Goals (current goals can now be found in the care plan section) Acute Rehab PT Goals Patient Stated Goal: to go home today PT Goal Formulation: With patient Time For Goal Achievement: 05/22/22 Potential to Achieve Goals: Good Progress towards PT goals: Progressing toward goals    Frequency    Min 4X/week      PT Plan Current plan remains appropriate    Co-evaluation              AM-PAC PT "6 Clicks" Mobility   Outcome Measure  Help needed turning from your back to your side while in a flat bed without using bedrails?: None Help needed moving from lying on your back to sitting on the side of a flat bed without using bedrails?: None Help needed moving to and from a bed to a chair (including a wheelchair)?: A Little Help needed standing up from a chair using your arms (e.g., wheelchair or bedside chair)?: A Little Help needed to walk in hospital room?: A Little Help needed climbing 3-5 steps with a railing? : A Little 6 Click Score: 20    End of Session Equipment Utilized During Treatment: Gait belt Activity Tolerance: Patient tolerated treatment well Patient left: in bed;with call bell/phone within reach;with family/visitor present Nurse  Communication: Mobility status PT Visit Diagnosis: Pain;Difficulty in walking, not elsewhere classified (R26.2);Unsteadiness on feet (R26.81) Pain - Right/Left: Right Pain - part of body: Hip (back)     Time: 8250-5397 PT Time Calculation (min) (ACUTE ONLY): 19 min  Charges:  $Gait Training: 8-22 mins                     Vale Haven, PT, DPT Acute Rehabilitation Services Secure chat preferred Office [phone]      Blake Divine A Eleny Cortez 05/08/2022, 1:08 PM

## 2022-05-08 NOTE — Progress Notes (Signed)
OT Cancellation Note  Patient Details Name: Debbie Hatfield MRN: [id number] DOB: [birth year]   Cancelled Treatment:    Reason Eval/Treat Not Completed: Active bedrest order (Per [facility] protocol. OT to evaluate once activity orders are updated)  Lelon Mast A Lowella Kindley 05/08/2022, 7:44 AM

## 2022-05-10 ENCOUNTER — Ambulatory Visit: Payer: Medicare Other | Attending: Neurology | Admitting: Occupational Therapy

## 2022-05-10 ENCOUNTER — Encounter: Payer: Self-pay | Admitting: Internal Medicine

## 2022-05-10 ENCOUNTER — Encounter: Payer: Self-pay | Admitting: Occupational Therapy

## 2022-05-10 ENCOUNTER — Ambulatory Visit (INDEPENDENT_AMBULATORY_CARE_PROVIDER_SITE_OTHER): Payer: Medicare Other | Admitting: Internal Medicine

## 2022-05-10 VITALS — BP 160/78 | HR 88 | Temp 97.8°F | Resp 16 | Ht 62.0 in | Wt 147.0 lb

## 2022-05-10 DIAGNOSIS — I340 Nonrheumatic mitral (valve) insufficiency: Secondary | ICD-10-CM | POA: Diagnosis not present

## 2022-05-10 DIAGNOSIS — M6281 Muscle weakness (generalized): Secondary | ICD-10-CM | POA: Diagnosis present

## 2022-05-10 DIAGNOSIS — M25551 Pain in right hip: Secondary | ICD-10-CM | POA: Insufficient documentation

## 2022-05-10 DIAGNOSIS — I69328 Other speech and language deficits following cerebral infarction: Secondary | ICD-10-CM

## 2022-05-10 DIAGNOSIS — I63311 Cerebral infarction due to thrombosis of right middle cerebral artery: Secondary | ICD-10-CM

## 2022-05-10 DIAGNOSIS — R2681 Unsteadiness on feet: Secondary | ICD-10-CM | POA: Insufficient documentation

## 2022-05-10 DIAGNOSIS — I10 Essential (primary) hypertension: Secondary | ICD-10-CM

## 2022-05-10 DIAGNOSIS — R278 Other lack of coordination: Secondary | ICD-10-CM | POA: Insufficient documentation

## 2022-05-10 DIAGNOSIS — R41840 Attention and concentration deficit: Secondary | ICD-10-CM | POA: Insufficient documentation

## 2022-05-10 HISTORY — DX: Nonrheumatic mitral (valve) insufficiency: I34.0

## 2022-05-10 MED ORDER — OLMESARTAN MEDOXOMIL 20 MG PO TABS: 20.0000 mg | ORAL_TABLET | Freq: Every day | ORAL | 0 refills | Status: DC

## 2022-05-10 NOTE — Patient Instructions (Signed)
Hypertension, Adult High blood pressure (hypertension) is when the force of blood pumping through the arteries is too strong. The arteries are the blood vessels that carry blood from the heart throughout the body. Hypertension forces the heart to work harder to pump blood and may cause arteries to become narrow or stiff. Untreated or uncontrolled hypertension can lead to a heart attack, heart failure, a stroke, kidney disease, and other problems. A blood pressure reading consists of a higher number over a lower number. Ideally, your blood pressure should be below 120/80. The first ("top") number is called the systolic pressure. It is a measure of the pressure in your arteries as your heart beats. The second ("bottom") number is called the diastolic pressure. It is a measure of the pressure in your arteries as the heart relaxes. What are the causes? The exact cause of this condition is not known. There are some conditions that result in high blood pressure. What increases the risk? Certain factors may make you more likely to develop high blood pressure. Some of these risk factors are under your control, including: Smoking. Not getting enough exercise or physical activity. Being overweight. Having too much fat, sugar, calories, or salt (sodium) in your diet. Drinking too much alcohol. Other risk factors include: Having a personal history of heart disease, diabetes, high cholesterol, or kidney disease. Stress. Having a family history of high blood pressure and high cholesterol. Having obstructive sleep apnea. Age. The risk increases with age. What are the signs or symptoms? High blood pressure may not cause symptoms. Very high blood pressure (hypertensive crisis) may cause: Headache. Fast or irregular heartbeats (palpitations). Shortness of breath. Nosebleed. Nausea and vomiting. Vision changes. Severe chest pain, dizziness, and seizures. How is this diagnosed? This condition is diagnosed by  measuring your blood pressure while you are seated, with your arm resting on a flat surface, your legs uncrossed, and your feet flat on the floor. The cuff of the blood pressure monitor will be placed directly against the skin of your upper arm at the level of your heart. Blood pressure should be measured at least twice using the same arm. Certain conditions can cause a difference in blood pressure between your right and left arms. If you have a high blood pressure reading during one visit or you have normal blood pressure with other risk factors, you may be asked to: Return on a different day to have your blood pressure checked again. Monitor your blood pressure at home for 1 week or longer. If you are diagnosed with hypertension, you may have other blood or imaging tests to help your health care provider understand your overall risk for other conditions. How is this treated? This condition is treated by making healthy lifestyle changes, such as eating healthy foods, exercising more, and reducing your alcohol intake. You may be referred for counseling on a healthy diet and physical activity. Your health care provider may prescribe medicine if lifestyle changes are not enough to get your blood pressure under control and if: Your systolic blood pressure is above 130. Your diastolic blood pressure is above 80. Your personal target blood pressure may vary depending on your medical conditions, your age, and other factors. Follow these instructions at home: Eating and drinking  Eat a diet that is high in fiber and potassium, and low in sodium, added sugar, and fat. An example of this eating plan is called the DASH diet. DASH stands for Dietary Approaches to Stop Hypertension. To eat this way: Eat   plenty of fresh fruits and vegetables. Try to fill one half of your plate at each meal with fruits and vegetables. Eat whole grains, such as whole-wheat pasta, brown rice, or whole-grain bread. Fill about one  fourth of your plate with whole grains. Eat or drink low-fat dairy products, such as skim milk or low-fat yogurt. Avoid fatty cuts of meat, processed or cured meats, and poultry with skin. Fill about one fourth of your plate with lean proteins, such as fish, chicken without skin, beans, eggs, or tofu. Avoid pre-made and processed foods. These tend to be higher in sodium, added sugar, and fat. Reduce your daily sodium intake. Many people with hypertension should eat less than 1,500 mg of sodium a day. Do not drink alcohol if: Your health care provider tells you not to drink. You are pregnant, may be pregnant, or are planning to become pregnant. If you drink alcohol: Limit how much you have to: 0-1 drink a day for women. 0-2 drinks a day for men. Know how much alcohol is in your drink. In the [place] one drink equals one 12 oz bottle of beer (355 mL), one 5 oz glass of wine (148 mL), or one 1 oz glass of hard liquor (44 mL). Lifestyle  Work with your health care provider to maintain a healthy body weight or to lose weight. Ask what an ideal weight is for you. Get at least 30 minutes of exercise that causes your heart to beat faster (aerobic exercise) most days of the week. Activities may include walking, swimming, or biking. Include exercise to strengthen your muscles (resistance exercise), such as Pilates or lifting weights, as part of your weekly exercise routine. Try to do these types of exercises for 30 minutes at least 3 days a week. Do not use any products that contain nicotine or tobacco. These products include cigarettes, chewing tobacco, and vaping devices, such as e-cigarettes. If you need help quitting, ask your health care provider. Monitor your blood pressure at home as told by your health care provider. Keep all follow-up visits. This is important. Medicines Take over-the-counter and prescription medicines only as told by your health care provider. Follow directions carefully. Blood  pressure medicines must be taken as prescribed. Do not skip doses of blood pressure medicine. Doing this puts you at risk for problems and can make the medicine less effective. Ask your health care provider about side effects or reactions to medicines that you should watch for. Contact a health care provider if you: Think you are having a reaction to a medicine you are taking. Have headaches that keep coming back (recurring). Feel dizzy. Have swelling in your ankles. Have trouble with your vision. Get help right away if you: Develop a severe headache or confusion. Have unusual weakness or numbness. Feel faint. Have severe pain in your chest or abdomen. Vomit repeatedly. Have trouble breathing. These symptoms may be an emergency. Get help right away. Call 911. Do not wait to see if the symptoms will go away. Do not drive yourself to the hospital. Summary Hypertension is when the force of blood pumping through your arteries is too strong. If this condition is not controlled, it may put you at risk for serious complications. Your personal target blood pressure may vary depending on your medical conditions, your age, and other factors. For most people, a normal blood pressure is less than 120/80. Hypertension is treated with lifestyle changes, medicines, or a combination of both. Lifestyle changes include losing weight, eating a healthy,   low-sodium diet, exercising more, and limiting alcohol. This information is not intended to replace advice given to you by your health care provider. Make sure you discuss any questions you have with your health care provider. Document Revised: 08/08/2021 Document Reviewed: 08/08/2021 Elsevier Patient Education  2023 [employer]  

## 2022-05-10 NOTE — Progress Notes (Signed)
Subjective:  Patient ID: Debbie Hatfield, female    DOB: 1955-12-15  Age: 66 y.o. MRN: [id number]  CC: Hypertension   HPI Debbie Hatfield presents for f/up -  She was recently admitted for a small cerebral stroke.  Her only lingering neurological sequelae is some difficulty with speech.  Her post discharge course has been complicated by an episode of sciatica which was treated recently with steroids by orthopedics.  She denies headache, blurred vision, or paresthesias.  Her echo during the admission showed mitral regurgitation.  Date of Admission: 05/07/2022 Date of Discharge: 05/08/2022   Attending Physician:  Marvel Plan MD Consultant(s):    None  Patient's PCP:  Debbie Grandchild, MD   DISCHARGE DIAGNOSIS: Small acute infarct in the right posterior frontal white matter likely due to small vessel disease Principal Problem:   Stroke (cerebrum) [facility])  Outpatient Medications Prior to Visit  Medication Sig Dispense Refill   acetaminophen (TYLENOL) 325 MG tablet Take 325-650 mg by mouth every 6 (six) hours as needed for mild pain.     acyclovir (ZOVIRAX) 400 MG tablet Take 1 tablet (400 mg total) by mouth daily. 90 tablet 0   amLODipine (NORVASC) 5 MG tablet Take 1 tablet (5 mg total) by mouth daily. 90 tablet 0   aspirin EC 81 MG tablet Take 1 tablet (81 mg total) by mouth daily. Swallow whole. 30 tablet 12   carbamazepine (CARBATROL) 300 MG 12 hr capsule Take 1 capsule (300 mg total) by mouth daily. 90 capsule 0   clopidogrel (PLAVIX) 75 MG tablet Take 1 tablet (75 mg total) by mouth daily. 30 tablet 1   Cyanocobalamin (B-12 PO) Take 1 tablet by mouth daily.     esomeprazole (NEXIUM) 40 MG capsule Take 40 mg by mouth Nightly.     gabapentin (NEURONTIN) 300 MG capsule Take 600 mg by mouth 3 (three) times daily.     hydrOXYzine (ATARAX) 10 MG tablet Take 1 tablet (10 mg total) by mouth every 8 (eight) hours as needed. (Patient taking differently: Take 10 mg by mouth every 8 (eight) hours as  needed for anxiety.) 270 tablet 0   rosuvastatin (CRESTOR) 20 MG tablet Take 1 tablet (20 mg total) by mouth daily. 30 tablet 1   telmisartan-hydrochlorothiazide (MICARDIS HCT) 40-12.5 MG tablet TAKE 1 TABLET BY MOUTH DAILY 90 tablet 0   tizanidine (ZANAFLEX) 2 MG capsule Take 4 mg by mouth at bedtime.     traMADol (ULTRAM) 50 MG tablet Take 100 mg by mouth 3 (three) times daily.     venlafaxine XR (EFFEXOR XR) 37.5 MG 24 hr capsule Take 2 capsules (75 mg total) by mouth daily with breakfast. (Patient taking differently: Take 75 mg by mouth at bedtime.) 180 capsule 1   Cholecalciferol (D3 PO) Take 1 capsule by mouth daily.     predniSONE (DELTASONE) 5 MG tablet Take 5-15 mg by mouth daily.     No facility-administered medications prior to visit.    ROS Review of Systems  Constitutional: Negative.  Negative for diaphoresis and fatigue.  HENT: Negative.    Eyes: Negative.   Respiratory: Negative.  Negative for choking, shortness of breath and wheezing.   Cardiovascular:  Negative for chest pain, palpitations and leg swelling.  Gastrointestinal:  Negative for abdominal pain, constipation, diarrhea, nausea and vomiting.  Endocrine: Negative.   Genitourinary: Negative.   Musculoskeletal:  Positive for back pain and gait problem. Negative for myalgias.  Neurological:  Positive for speech difficulty. Negative  for dizziness, weakness and numbness.  Hematological:  Negative for adenopathy. Does not bruise/bleed easily.  Psychiatric/Behavioral: Negative.      Objective:  BP (!) 160/78 (BP Location: Right Arm, Patient Position: Sitting, Cuff Size: Large)   Pulse 88   Temp 97.8 F (36.6 C) (Oral)   Resp 16   Ht 5\' 2"  (1.575 m)   Wt 147 lb (66.7 kg)   SpO2 98%   BMI 26.89 kg/m   BP Readings from Last 3 Encounters:  05/10/22 (!) 160/78  05/08/22 (!) 158/68  04/30/22 134/76    Wt Readings from Last 3 Encounters:  05/10/22 147 lb (66.7 kg)  05/07/22 145 lb 1 oz (65.8 kg)  04/30/22  143 lb (64.9 kg)    Physical Exam Vitals reviewed.  HENT:     Nose: Nose normal.     Mouth/Throat:     Mouth: Mucous membranes are moist.  Eyes:     General: No scleral icterus.    Conjunctiva/sclera: Conjunctivae normal.  Cardiovascular:     Rate and Rhythm: Normal rate and regular rhythm.     Heart sounds: Murmur heard.     Crescendo systolic murmur is present with a grade of 2/6.     No gallop.  Musculoskeletal:        General: No swelling.     Cervical back: Neck supple.     Right lower leg: No edema.     Left lower leg: No edema.  Skin:    General: Skin is warm and dry.  Neurological:     General: No focal deficit present.     Mental Status: She is alert.  Psychiatric:        Mood and Affect: Mood normal.        Behavior: Behavior normal.     Lab Results  Component Value Date   WBC 5.3 05/08/2022   HGB 10.9 (L) 05/08/2022   HCT 33.4 (L) 05/08/2022   PLT 294 05/08/2022   GLUCOSE 94 05/08/2022   CHOL 238 (H) 05/08/2022   TRIG 119 05/08/2022   HDL 109 05/08/2022   LDLCALC 105 (H) 05/08/2022   ALT 18 05/07/2022   AST 20 05/07/2022   NA 141 05/08/2022   K 4.0 05/08/2022   CL 107 05/08/2022   CREATININE 0.82 05/08/2022   BUN 18 05/08/2022   CO2 26 05/08/2022   TSH 1.45 11/15/2021   INR 0.9 05/07/2022   HGBA1C 5.7 (H) 05/08/2022    CT HEAD WO CONTRAST (5MM)  Result Date: 05/08/2022 CLINICAL DATA:  Follow-up stroke EXAM: CT HEAD WITHOUT CONTRAST TECHNIQUE: Contiguous axial images were obtained from the base of the skull through the vertex without intravenous contrast. RADIATION DOSE REDUCTION: This exam was performed according to the departmental dose-optimization program which includes automated exposure control, adjustment of the mA and/or kV according to patient size and/or use of iterative reconstruction technique. COMPARISON:  Head CT dated April 07, 2022 FINDINGS: Brain: Small evolving infarct of the right posterior frontal white matter. No evidence of  hemorrhage, hydrocephalus, extra-axial collection or mass lesion/mass effect. Vascular: No hyperdense vessel or unexpected calcification. Skull: Normal. Negative for fracture or focal lesion. Sinuses/Orbits: No acute finding. Other: None. IMPRESSION: Small evolving infarct of the right posterior frontal white matter. Electronically Signed   By: Yetta Glassman M.D.   On: 05/08/2022 08:16   ECHOCARDIOGRAM COMPLETE  Result Date: 05/07/2022    ECHOCARDIOGRAM REPORT   Patient Name:   NATAJHA PINTADO Date of Exam: 05/07/2022  Medical Rec #:  [id number]      Height:       62.0 in Accession #:    [id number]     Weight:       145.1 lb Date of Birth:  1955-11-11       BSA:          1.668 m Patient Age:    54 years       BP:           167/80 mmHg Patient Gender: F              HR:           57 bpm. Exam Location:  Inpatient Procedure: 2D Echo, Cardiac Doppler and Color Doppler Indications:    Stroke I63.9  History:        Patient has prior history of Echocardiogram examinations, most                 recent 01/31/2021. Risk Factors:Hypertension.  Sonographer:    Ronny Flurry Sonographer#2:  Bernadene Person RDCS Referring Phys: [id number] [place]  1. Left ventricular ejection fraction, by estimation, is 60 to 65%. The left ventricle has normal function. The left ventricle has no regional wall motion abnormalities. Left ventricular diastolic parameters were normal.  2. Right ventricular systolic function is normal. The right ventricular size is normal. There is normal pulmonary artery systolic pressure.  3. Left atrial size was mildly dilated.  4. The mitral valve is abnormal. Mild mitral valve regurgitation. No evidence of mitral stenosis.  5. The aortic valve is tricuspid. There is mild calcification of the aortic valve. There is mild thickening of the aortic valve. Aortic valve regurgitation is trivial. Aortic valve sclerosis is present, with no evidence of aortic valve stenosis.  6. The inferior vena cava is  normal in size with greater than 50% respiratory variability, suggesting right atrial pressure of 3 mmHg. FINDINGS  Left Ventricle: Left ventricular ejection fraction, by estimation, is 60 to 65%. The left ventricle has normal function. The left ventricle has no regional wall motion abnormalities. The left ventricular internal cavity size was normal in size. There is  no left ventricular hypertrophy. Left ventricular diastolic parameters were normal. Right Ventricle: The right ventricular size is normal. No increase in right ventricular wall thickness. Right ventricular systolic function is normal. There is normal pulmonary artery systolic pressure. The tricuspid regurgitant velocity is 2.20 m/s, and  with an assumed right atrial pressure of 3 mmHg, the estimated right ventricular systolic pressure is [id number] mmHg. Left Atrium: Left atrial size was mildly dilated. Right Atrium: Right atrial size was normal in size. Pericardium: There is no evidence of pericardial effusion. Mitral Valve: The mitral valve is abnormal. There is mild thickening of the mitral valve leaflet(s). There is mild calcification of the mitral valve leaflet(s). Mild mitral annular calcification. Mild mitral valve regurgitation. No evidence of mitral valve stenosis. Tricuspid Valve: The tricuspid valve is normal in structure. Tricuspid valve regurgitation is mild . No evidence of tricuspid stenosis. Aortic Valve: The aortic valve is tricuspid. There is mild calcification of the aortic valve. There is mild thickening of the aortic valve. Aortic valve regurgitation is trivial. Aortic valve sclerosis is present, with no evidence of aortic valve stenosis. Pulmonic Valve: The pulmonic valve was normal in structure. Pulmonic valve regurgitation is not visualized. No evidence of pulmonic stenosis. Aorta: The aortic root is normal in size and structure. Venous: The inferior vena cava is normal in  size with greater than 50% respiratory variability, suggesting  right atrial pressure of 3 mmHg. IAS/Shunts: No atrial level shunt detected by color flow Doppler.  LEFT VENTRICLE PLAX 2D LVIDd:         4.00 cm     Diastology LVIDs:         2.20 cm     LV e' medial:    6.57 cm/s LV PW:         1.00 cm     LV E/e' medial:  13.4 LV IVS:        0.80 cm     LV e' lateral:   7.98 cm/s LVOT diam:     1.90 cm     LV E/e' lateral: 11.0 LV SV:         72 LV SV Index:   43 LVOT Area:     2.84 cm  LV Volumes (MOD) LV vol d, MOD A2C: 83.2 ml LV vol d, MOD A4C: 83.7 ml LV vol s, MOD A2C: 30.9 ml LV vol s, MOD A4C: 33.3 ml LV SV MOD A2C:     52.3 ml LV SV MOD A4C:     83.7 ml LV SV MOD BP:      50.3 ml RIGHT VENTRICLE RV S prime:     11.90 cm/s TAPSE (M-mode): 2.0 cm LEFT ATRIUM             Index        RIGHT ATRIUM           Index LA diam:        3.80 cm 2.28 cm/m   RA Area:     10.10 cm LA Vol (A2C):   56.5 ml 33.88 ml/m  RA Volume:   18.40 ml  11.03 ml/m LA Vol (A4C):   39.3 ml 23.56 ml/m LA Biplane Vol: 49.4 ml 29.62 ml/m  AORTIC VALVE LVOT Vmax:   96.30 cm/s LVOT Vmean:  71.700 cm/s LVOT VTI:    0.254 m  AORTA Ao Root diam: 3.00 cm Ao Asc diam:  3.00 cm MITRAL VALVE               TRICUSPID VALVE MV Area (PHT): 3.31 cm    TR Peak grad:   19.4 mmHg MV Decel Time: 229 msec    TR Vmax:        220.00 cm/s MV E velocity: 88.10 cm/s MV A velocity: 96.70 cm/s  SHUNTS MV E/A ratio:  0.91        Systemic VTI:  0.25 m                            Systemic Diam: 1.90 cm Jenkins Rouge MD Electronically signed by Jenkins Rouge MD Signature Date/Time: 05/07/2022/11:50:43 AM    Final    MR BRAIN WO CONTRAST  Result Date: 05/07/2022 CLINICAL DATA:  Stroke, follow up EXAM: MRI HEAD WITHOUT CONTRAST TECHNIQUE: Multiplanar, multiecho pulse sequences of the brain and surrounding structures were obtained without intravenous contrast. COMPARISON:  Same day CT head and CTA head/neck. FINDINGS: Brain: Small acute infarct in the right posterior frontal white matter. Slight edema without mass effect.  Moderate scattered T2/FLAIR hyperintensities within the white matter. No evidence of acute hemorrhage, mass lesion, midline shift or hydrocephalus. Vascular: Major arterial flow voids are maintained at the skull base Skull and upper cervical spine: Normal marrow signal. Sinuses/Orbits: Visualized sinuses are clear. No acute orbital findings. Other:  No mastoid effusions. IMPRESSION: 1. Small acute infarct in the right posterior frontal white matter. 2. Moderate scattered T2/FLAIR hyperintensities the white matter, which are nonspecific but most likely due to chronic microvascular ischemic disease given the patient's risk factors. Chronic demyelination is thought less likely. Electronically Signed   By: Feliberto Harts M.D.   On: 05/07/2022 09:38   CT ANGIO HEAD NECK W WO CM W PERF (CODE STROKE)  Result Date: 05/07/2022 CLINICAL DATA:  Neuro deficit, acute, stroke suspected. EXAM: CT ANGIOGRAPHY HEAD AND NECK TECHNIQUE: Multidetector CT imaging of the head and neck was performed using the standard protocol during bolus administration of intravenous contrast. Multiplanar CT image reconstructions and MIPs were obtained to evaluate the vascular anatomy. Carotid stenosis measurements (when applicable) are obtained utilizing NASCET criteria, using the distal internal carotid diameter as the denominator. RADIATION DOSE REDUCTION: This exam was performed according to the departmental dose-optimization program which includes automated exposure control, adjustment of the mA and/or kV according to patient size and/or use of iterative reconstruction technique. CONTRAST:  OMNIPAQUE IOHEXOL 350 MG/ML SOLN COMPARISON:  Same day CT head. FINDINGS: CTA NECK FINDINGS Aortic arch: Great vessel origins are patent. Right carotid system: No evidence of dissection, stenosis (50% or greater) or occlusion. Left carotid system: No evidence of dissection, stenosis (50% or greater) or occlusion. Vertebral arteries: Reflux of  contrast into the venous vessels in the neck results in streak artifact and significantly limits evaluation the vertebral arteries. The reflux of contrast in the neck is likely due to narrowing of the brachiocephalic vein subjacent to the sternum. Within this limitation, both vertebral arteries appear patent without visible high-grade stenosis. Skeleton: No acute fracture. Other neck: 2.0 cm right thyroid nodule. Upper chest: Visualized lung apices are clear. Review of the MIP images confirms the above findings CTA HEAD FINDINGS Anterior circulation: Bilateral intracranial ICAs, MCAs and ACAs are patent without proximal hemodynamically significant stenosis. Posterior circulation: Bilateral intradural vertebral arteries, basilar artery, and both posterior cerebral arteries are patent without proximal hemodynamically significant stenosis. Venous sinuses: As permitted by contrast timing, patent. Review of the MIP images confirms the above findings IMPRESSION: 1. No emergent large vessel occlusion. 2. No visible hemodynamically significant stenosis in the neck with limited assessment of the vertebral arteries due to reflux of venous contrast into the neck. 3. Approximately 2.0 cm right thyroid nodule. Recommend thyroid ultrasound (ref: J Am Coll Radiol. 2015 Feb;12(2): 143-50). Electronically Signed   By: Feliberto Harts M.D.   On: 05/07/2022 09:26   CT HEAD CODE STROKE WO CONTRAST  Result Date: 05/07/2022 CLINICAL DATA:  Code stroke. Neuro deficit, acute, stroke suspected. EXAM: CT HEAD WITHOUT CONTRAST TECHNIQUE: Contiguous axial images were obtained from the base of the skull through the vertex without intravenous contrast. RADIATION DOSE REDUCTION: This exam was performed according to the departmental dose-optimization program which includes automated exposure control, adjustment of the mA and/or kV according to patient size and/or use of iterative reconstruction technique. COMPARISON:  03/26/2022 FINDINGS:  Brain: No acute intracranial finding. Question old small left parietal cortical infarction as seen previously. No mass, hemorrhage, hydrocephalus or extra-axial collection. Aided Vascular: Minimal calcification in the carotid siphon regions. Otherwise negative. No hyperdense vessel identified. Skull: Normal Sinuses/Orbits: Clear/normal Other: None ASPECTS ([place] Stroke Program Early CT Score) - Ganglionic level infarction (caudate, lentiform nuclei, internal capsule, insula, M1-M3 cortex): 7 - Supraganglionic infarction (M4-M6 cortex): 3 Total score (0-10 with 10 being normal): 10 IMPRESSION: 1. No acute CT finding. Question old  small left parietal cortical infarction, unchanged from prior exams. 2. ASPECTS is 10. 3. These results were communicated to Dr. Erlinda Hong at 9:05 am on 05/07/2022 by text page via the [facility] messaging system. Electronically Signed   By: Nelson Chimes M.D.   On: 05/07/2022 09:06    Assessment & Plan:   Zonnie was seen today for hypertension.  Diagnoses and all orders for this visit:  Cerebrovascular accident (CVA) due to thrombosis of right middle cerebral artery ([place]) -     Ambulatory referral to [place]  Primary hypertension- Her blood pressure is not adequately well controlled.  Will evaluate for hyperaldosteronism and start an ARB. -     olmesartan (BENICAR) 20 MG tablet; Take 1 tablet (20 mg total) by mouth daily. -     Aldosterone + renin activity w/ ratio; Future -     Aldosterone + renin activity w/ ratio  Nonrheumatic mitral valve regurgitation -     Ambulatory referral to Cardiology   I am having Reeves Forth start on olmesartan. I am also having her maintain her esomeprazole, traMADol, gabapentin, tizanidine, venlafaxine XR, hydrOXYzine, acyclovir, carbamazepine, amLODipine, telmisartan-hydrochlorothiazide, acetaminophen, Cholecalciferol (D3 PO), Cyanocobalamin (B-12 PO), predniSONE, aspirin EC, clopidogrel, and rosuvastatin.  Meds ordered this encounter   Medications   olmesartan (BENICAR) 20 MG tablet    Sig: Take 1 tablet (20 mg total) by mouth daily.    Dispense:  90 tablet    Refill:  0     Follow-up: Return in about 6 weeks (around 06/21/2022).  Scarlette Calico, MD

## 2022-05-10 NOTE — Therapy (Signed)
OUTPATIENT OCCUPATIONAL THERAPY NEURO EVALUATION  Patient Name: Debbie Hatfield MRN: [id number] DOB:[birth year], 66 y.o., female Today's Date: 05/10/2022  PCP: [name] Linger REFERRING PROVIDER: Elmer Picker - will send to [name] Linger PCP   OT End of Session - 05/10/22 1435     Visit Number 1    Number of Visits 13    Date for OT Re-Evaluation 07/09/22    Authorization Type Medicare A&B    Progress Note Due on Visit 10    OT Start Time 1100    OT Stop Time 1145    OT Time Calculation (min) 45 min    Activity Tolerance Patient tolerated treatment well    Behavior During Therapy WFL for tasks assessed/performed             Past Medical History:  Diagnosis Date   Closed fracture of right distal radius    Gastroparesis    GERD (gastroesophageal reflux disease)    Hypertension    Irritable bowel disease    Lupus (HCC)    Major depression    Trigeminal neuralgia    Past Surgical History:  Procedure Laterality Date   CHOLECYSTECTOMY     COLONOSCOPY     ORIF WRIST FRACTURE Right 09/22/2020   Procedure: OPEN REDUCTION INTERNAL FIXATION (ORIF) WRIST FRACTURE;  Surgeon: Ernest Mallick, MD;  Location: [facility];  Service: Orthopedics;  Laterality: Right;    Patient Active Problem List   Diagnosis Date Noted   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   Anemia, chronic disease 05/06/2022   Hyperlipidemia LDL goal <100 03/26/2022   Recurrent major depressive disorder, in partial remission (HCC) 11/15/2021   Need for shingles vaccine 11/15/2021   Visit for screening mammogram 11/15/2021   Need for vaccination 11/15/2021   GAD (generalized anxiety disorder) 11/15/2021   Stage 3a chronic kidney disease (HCC) 11/15/2021   Trigeminal neuralgia    Lupus (HCC)    Irritable bowel disease    GERD (gastroesophageal reflux disease)    Hypertension    Gastroparesis    Degeneration of lumbar intervertebral disc 01/26/2020   Osteopenia 12/14/2015   Peptic ulcer  disease 12/14/2015    ONSET DATE: 05/08/22  REFERRING DIAG: Small acute infarct in the right posterior frontal white matter likely due to small vessel disease   THERAPY DIAG:  Other lack of coordination  Muscle weakness (generalized)  Unsteadiness on feet  Attention and concentration deficit  Pain in right hip  Rationale for Evaluation and Treatment Rehabilitation  SUBJECTIVE:   SUBJECTIVE STATEMENT: The left side of my face and my tongue more than my arms/legs Pt accompanied by: family member  PERTINENT HISTORY: connective tissue disorder, HTN  PRECAUTIONS: Fall  WEIGHT BEARING RESTRICTIONS No  PAIN:  Are you having pain? Yes: NPRS scale: 6/10 Pain location: Back radiating to hip and thigh Pain description: stabbing, burning Aggravating factors: sit to stand/ stand to sit, bed mobility Relieving factors: walking, heat and ice/ meds - limited help  FALLS: Has patient fallen in last 6 months? No  LIVING ENVIRONMENT: Lives with: lives with their family Lives in: House/apartment Stairs: No Has following equipment at home: Walker - 2 wheeled, shower chair, and bed side commode  PLOF: Independent with basic ADLs  PATIENT GOALS Wants to return to independence  OBJECTIVE:   HAND DOMINANCE: Right  ADLs: Overall ADLs: Mod assist Transfers/ambulation related to ADLs: Eating: Independent - may have some issues with swallowing Grooming: Independent UB Dressing: Able to don/doff pull over  shirt and bra LB Dressing: Unable to doff/don underwear, pants, socks, shoes, tie shoes - max assist Toileting: Walks with walker - has to use LEFT arm to clean - not easy.  Has BSC over toilet Bathing: Cannot bathe/dry self below knees Tub Shower transfers: Using mom's walk in shower and seat.  Has ~4in lip.   Equipment: Shower seat with back, Walk in shower, and bed side commode   IADLs: Patient is just home from the hospital, has not yet attempted IADL's    POSTURE  COMMENTS:  decreased lumbar lordosis and decreased thoracic kyphosisPatient sits erectly has limited pelvic excursion toward posterior tilt Sitting balance: Moves/returns truncal midpoint 1-2 inches in multiple planes  ACTIVITY TOLERANCE: Activity tolerance: Reports fatigue and overstimulation    UPPER EXTREMITY ROM     Active ROM Right eval Left eval  Shoulder flexion [facility]  Shoulder abduction thru out thru out  Shoulder adduction    Shoulder extension    Shoulder internal rotation    Shoulder external rotation    Elbow flexion    Elbow extension    Wrist flexion    Wrist extension    Wrist ulnar deviation    Wrist radial deviation    Wrist pronation    Wrist supination    (Blank rows = not tested)   UPPER EXTREMITY MMT:     MMT Right eval Left eval  Shoulder flexion 4+/5 4-/5  Shoulder abduction    Shoulder adduction    Shoulder extension    Shoulder internal rotation    Shoulder external rotation    Middle trapezius    Lower trapezius    Elbow flexion    Elbow extension    Wrist flexion    Wrist extension    Wrist ulnar deviation    Wrist radial deviation    Wrist pronation    Wrist supination    (Blank rows = not tested)  HAND FUNCTION: Grip strength: Right: 33 lbs; Left: 16.3 lbs and Lateral pinch: Right: 8 lbs, Left: 4 lbs  COORDINATION: 9 Hole Peg test: Right: 36.09 sec; Left: 42.65 sec  SENSATION: WFL    MUSCLE TONE: RUE: Within functional limits and LUE: Within functional limits  COGNITION: Overall cognitive status: Impaired Patient reports thoughts are unclear, she feels "fuzzy"  Fatigue and pain also factors  VISION: Subjective report:  Reports no change to vision with recent stroke Baseline vision: Wears glasses for reading only Visual history: Had failing vision in left eye due to medication side effect.  Med stopped 2 months ago - vision improved in R eye - still has some central vision and left inferior field deficits per  report.   VISION ASSESSMENT: To be further assessed in functional context    PERCEPTION: WFL  PRAXIS: WFL     TODAY'S TREATMENT:  Reviewed OT potential goals and plan of care following sharing OT eval results.     PATIENT EDUCATION: Education details: eval results Person educated: Patient Education method: Explanation Education comprehension: needs further education   HOME EXERCISE PROGRAM: TBD    GOALS: Goals reviewed with patient? No  SHORT TERM GOALS: Target date: 06/10/22  Patient will complete an HEP designed to improve grip and pinch strength in LUE Baseline: Goal status: INITIAL  2.  Patient will demonstrate awareness of AE for LB bathing and dressing Baseline:  Goal status: INITIAL  3.  Patient will bathe lower body with min assist Baseline:  Goal status: INITIAL   LONG TERM GOALS: Target  date: 07/11/22  Patient will complete updated HEP designed to improve LUE strength and coordination Baseline:  Goal status: INITIAL  2.  Patient will demonstrate awareness of return to driving recommendations Baseline:  Goal status: INITIAL  3.  Patient will demonstrate awareness of return to work recommendations Baseline:  Goal status: INITIAL  4.  Patient will dress and bathe herself with modified independence Baseline:  Goal status: INITIAL   ASSESSMENT:  CLINICAL IMPRESSION: Patient is a 66 y.o. female with a past medical hisotry of undifferentiated connective tissue disease, GERD, gastroparesis, HTN, trigeminal neuralgia (left), and IBS. Referred to OT following acute cerebral infarction onset 05/08/22.   PERFORMANCE DEFICITS in functional skills including coordination, strength, pain, flexibility, FMC, mobility, balance, body mechanics, and endurance, cognitive skills including attention, memory, and sequencing.  IMPAIRMENTS are limiting patient from ADLs, IADLs, and work.   COMORBIDITIES may have co-morbidities  that affects occupational  performance. Patient will benefit from skilled OT to address above impairments and improve overall function.  MODIFICATION OR ASSISTANCE TO COMPLETE EVALUATION: Min-Moderate modification of tasks or assist with assess necessary to complete an evaluation.  OT OCCUPATIONAL PROFILE AND HISTORY: Detailed assessment: Review of records and additional review of physical, cognitive, psychosocial history related to current functional performance.  CLINICAL DECISION MAKING: Moderate - several treatment options, min-mod task modification necessary  REHAB POTENTIAL: Good  EVALUATION COMPLEXITY: Moderate    PLAN: OT FREQUENCY: 2x/week  OT DURATION: 8 weeks  PLANNED INTERVENTIONS: self care/ADL training, therapeutic exercise, therapeutic activity, neuromuscular re-education, manual therapy, balance training, functional mobility training, aquatic therapy, patient/family education, cognitive remediation/compensation, and DME and/or AE instructions  RECOMMENDED OTHER SERVICES: NA  CONSULTED AND AGREED WITH PLAN OF CARE: Patient and family member/caregiver  PLAN FOR NEXT SESSION: Review OT goals, Introduce AE for LB self care   Mariah Milling, OT 05/10/2022, 2:39 PM

## 2022-05-11 ENCOUNTER — Ambulatory Visit: Payer: Medicare Other

## 2022-05-11 ENCOUNTER — Ambulatory Visit: Payer: Medicare Other | Admitting: Physical Therapy

## 2022-05-14 NOTE — Progress Notes (Unsigned)
Cardiology Office Note:    Date:  05/15/2022   ID:  Debbie Hatfield, DOB [birth year], MRN [id number]  PCP:  Janith Lima, MD   [facility] Providers Cardiologist:  None Electrophysiologist:  Will Meredith Leeds, MD     Referring MD: Janith Lima, MD   Chief Complaint: chest pain  History of Present Illness:    Debbie Hatfield is a very pleasant 66 y.o. female with a hx of syncope, connective tissue disease, hypertension, GERD, trigeminal neuralgia, and CVA.   Initially seen by Dr. Bettina Gavia 01/12/2021 following an episode of syncope that occurred during defecation and she fell and struck her face and head.  ED evaluation showed normal CBC and BMP.  EKG with SR borderline first-degree AV block.  Normal CT of the head.  Seen again in ED on 01/07/2021 for headaches and dizziness felt to be secondary to concussion.  EKG was stable with no arrhythmia, troponin was normal.  Monitor in ED revealed HR 70 to 90 bpm without arrhythmia.  At office visit 01/12/2021 she reported a history of several years of near syncope without loss of consciousness.  She was home alone at the time of the initial syncopal event.  She reported that BP had recently been quite elevated.  Reported a history of several family members with slow heart rates and pacemakers.  Cardiac monitor completed 02/18/2021 revealed occasional ventricular and atrial premature beats, no significant arrhythmias.  Echocardiogram revealed no evidence of underlying heart disease. She was referred to Dr. Curt Bears, [place]. Seen by Dr. Curt Bears on 05/09/21 and LINQ monitor was implanted . There has been no recorded arrhythmia on monitor.   Admission 7/24-7/25/23 for CVA.  Went to bed the evening prior and woke up with unusual feeling on left face but could not tell much difference on examination.  Drove to work that morning and felt facial numbness, was noted to have facial droop.  At work she had dysarthria, left facial droop, and left sensory deficit.  Was  found to have small acute infarct in right posterior frontal white matter likely due to small vessel disease.  She was discharged home with OT/PT, advised to take aspirin and clopidogrel daily for 3 weeks then aspirin alone.  Today, she is here for post-hospital follow-up. Having episodes of chest pressure with radiation into neck. Occurred today while walking in today, now resolved. More pronounced after eating. Residual left sided weakness s/p CVA, still has home PT/OT and speech therapy. Uses a walker to ambulate. Chest pain started after hospital discharge. Cannot determine if symptoms worsen with activity due to bed rest during hospitalization and has just resumed activity yesterday due to severe sciatica. Currently on 20 mg daily of prednisone for sciatica pain, can now bear weight. Has always had pain when sitting to eat so she stands to eat. Use Gaviscon in addition to Nexium for GERD. Thinks chest pressure may be 2/2 GERD. She denies shortness of breath, lower extremity edema, fatigue, palpitations, melena, hematuria, hemoptysis, diaphoresis, presyncope, syncope, orthopnea, and PND.  Past Medical History:  Diagnosis Date   Closed fracture of right distal radius    Gastroparesis    GERD (gastroesophageal reflux disease)    Hypertension    Irritable bowel disease    Lupus ([place])    Major depression    Trigeminal neuralgia     Past Surgical History:  Procedure Laterality Date   CHOLECYSTECTOMY     COLONOSCOPY     ORIF WRIST FRACTURE Right 09/22/2020  Procedure: OPEN REDUCTION INTERNAL FIXATION (ORIF) WRIST FRACTURE;  Surgeon: Verner Mould, MD;  Location: [facility];  Service: Orthopedics;  Laterality: Right;  80min    Current Medications: Current Meds  Medication Sig   acetaminophen (TYLENOL) 325 MG tablet Take 325-650 mg by mouth every 6 (six) hours as needed for mild pain.   acyclovir (ZOVIRAX) 400 MG tablet Take 1 tablet (400 mg total) by mouth daily.    amLODipine (NORVASC) 5 MG tablet Take 1 tablet (5 mg total) by mouth daily.   aspirin EC 81 MG tablet Take 1 tablet (81 mg total) by mouth daily. Swallow whole.   carbamazepine (CARBATROL) 300 MG 12 hr capsule Take 1 capsule (300 mg total) by mouth daily.   Cholecalciferol (D3 PO) Take 1 capsule by mouth daily.   clopidogrel (PLAVIX) 75 MG tablet Take 1 tablet (75 mg total) by mouth daily.   Cyanocobalamin (B-12 PO) Take 1 tablet by mouth daily.   esomeprazole (NEXIUM) 40 MG capsule Take 40 mg by mouth Nightly.   gabapentin (NEURONTIN) 300 MG capsule Take 600 mg by mouth 3 (three) times daily.   hydrOXYzine (ATARAX) 10 MG tablet Take 1 tablet (10 mg total) by mouth every 8 (eight) hours as needed.   predniSONE (DELTASONE) 5 MG tablet Take 20 mg by mouth daily.   rosuvastatin (CRESTOR) 20 MG tablet Take 1 tablet (20 mg total) by mouth daily.   telmisartan-hydrochlorothiazide (MICARDIS HCT) 40-12.5 MG tablet TAKE 1 TABLET BY MOUTH DAILY   tizanidine (ZANAFLEX) 2 MG capsule Take 4 mg by mouth at bedtime.   traMADol (ULTRAM) 50 MG tablet Take 100 mg by mouth 3 (three) times daily.   venlafaxine XR (EFFEXOR XR) 37.5 MG 24 hr capsule Take 2 capsules (75 mg total) by mouth daily with breakfast.   [DISCONTINUED] olmesartan (BENICAR) 20 MG tablet Take 1 tablet (20 mg total) by mouth daily.     Allergies:   Plaquenil [hydroxychloroquine], Sulfa antibiotics, and Penicillins   Social History   Socioeconomic History   Marital status: Divorced    Spouse name: Not on file   Number of children: Not on file   Years of education: Not on file   Highest education level: Not on file  Occupational History   Not on file  Tobacco Use   Smoking status: Never    Passive exposure: Never   Smokeless tobacco: Never  Substance and Sexual Activity   Alcohol use: Yes    Comment: social   Drug use: Never   Sexual activity: Not Currently    Birth control/protection: Post-menopausal  Other Topics Concern   Not  on file  Social History Narrative   Not on file   Social Determinants of Health   Financial Resource Strain: Not on file  Food Insecurity: Not on file  Transportation Needs: Not on file  Physical Activity: Not on file  Stress: Not on file  Social Connections: Not on file     Family History: The patient's family history includes Alcoholism in her father; Colon cancer in her maternal grandfather; Congestive Heart Failure in her maternal grandfather and maternal grandmother; Depression in her mother; Diabetes in her maternal grandmother; Gout in her mother; Heart block in her mother; Hypertension in her mother and paternal grandfather; Macular degeneration in her mother; Stroke in her paternal grandfather.  ROS:   Please see the history of present illness.    + chest pain + left sided weakness All other systems reviewed and  are negative.  Labs/Other Studies Reviewed:    The following studies were reviewed today:  Echo 05/07/22  1. Left ventricular ejection fraction, by estimation, is 60 to 65%. The  left ventricle has normal function. The left ventricle has no regional  wall motion abnormalities. Left ventricular diastolic parameters were  normal.   2. Right ventricular systolic function is normal. The right ventricular  size is normal. There is normal pulmonary artery systolic pressure.   3. Left atrial size was mildly dilated.   4. The mitral valve is abnormal. Mild mitral valve regurgitation. No  evidence of mitral stenosis.   5. The aortic valve is tricuspid. There is mild calcification of the  aortic valve. There is mild thickening of the aortic valve. Aortic valve  regurgitation is trivial. Aortic valve sclerosis is present, with no  evidence of aortic valve stenosis.   6. The inferior vena cava is normal in size with greater than 50%  respiratory variability, suggesting right atrial pressure of 3 mmHg.   MR Brain 05/07/22  1. Small acute infarct in the right posterior  frontal white matter. 2. Moderate scattered T2/FLAIR hyperintensities the white matter, which are nonspecific but most likely due to chronic microvascular ischemic disease given the patient's risk factors. Chronic demyelination is thought less likely.    Cardiac monitor 02/18/21  Conclusion, first-degree AV block, rare ventricular and supraventricular ectopy however symptomatic events were predominantly associated with PVCs and APCs.    Echo 01/31/21  1. Left ventricular ejection fraction, by estimation, is 60 to 65%. The  left ventricle has normal function. The left ventricle has no regional  wall motion abnormalities. Left ventricular diastolic parameters are  consistent with Grade I diastolic  dysfunction (impaired relaxation).   2. Right ventricular systolic function is normal. The right ventricular  size is normal.   3. The mitral valve is normal in structure. Trivial mitral valve  regurgitation. No evidence of mitral stenosis.   4. The aortic valve is normal in structure. Aortic valve regurgitation is  trivial. Mild aortic valve sclerosis is present, with no evidence of  aortic valve stenosis.   5. The inferior vena cava is normal in size with greater than 50%  respiratory variability, suggesting right atrial pressure of 3 mmHg.    Recent Labs: 11/15/2021: TSH 1.45 05/07/2022: ALT 18 05/08/2022: BUN 18; Creatinine, Ser 0.82; Hemoglobin 10.9; Platelets 294; Potassium 4.0; Sodium 141  Recent Lipid Panel    Component Value Date/Time   CHOL 238 (H) 05/08/2022 0424   TRIG 119 05/08/2022 0424   HDL 109 05/08/2022 0424   CHOLHDL 2.2 05/08/2022 0424   VLDL 24 05/08/2022 0424   LDLCALC 105 (H) 05/08/2022 0424     Risk Assessment/Calculations:       Physical Exam:    VS:  BP 138/62   Pulse 66   Ht 5\' 2"  (1.575 m)   Wt 143 lb 9.6 oz (65.1 kg)   SpO2 96%   BMI 26.26 kg/m     Wt Readings from Last 3 Encounters:  05/15/22 143 lb 9.6 oz (65.1 kg)  05/10/22 147 lb (66.7  kg)  05/07/22 145 lb 1 oz (65.8 kg)     GEN:  Well nourished, well developed in no acute distress HEENT: Normal NECK: No JVD; No carotid bruits CARDIAC: RRR, no murmurs, rubs, gallops RESPIRATORY:  Clear to auscultation without rales, wheezing or rhonchi  ABDOMEN: Soft, non-tender, non-distended MUSCULOSKELETAL:  No edema; No deformity. 2+ pedal pulses, equal bilaterally SKIN: Warm  and dry NEUROLOGIC:  Alert and oriented x 3 PSYCHIATRIC:  Normal affect   EKG:  EKG is ordered today.  The ekg ordered today demonstrates NSR at 64 bpm, nonspecific T wave abnormality, no acute change from previous  Diagnoses:    1. Hypertension, unspecified type   2. Hyperlipidemia LDL goal <100   3. Chest pain of uncertain etiology   4. Syncope, unspecified syncope type   5. History of stroke    Assessment and Plan:     Chest pain: Has had chest tightness that radiates into her neck on a few occasions since hospital discharge. Had one episode today walking into our office which has resolved. No acute changes on EKG. In talking with her, this may be a symptom of GERD. Has had issues with swallowing food in the past.  She is just now increasing activity since discharge due to sciatica.  Advised her to monitor symptoms as activity increases and notify us if symptoms worsen. We will get a Lexiscan Myoview in 3 weeks when she is able to return to the office for that test. ED precautions advised.    Syncope: No recent episodes of presyncope or syncope. BP and HR are stable.  No evidence of arrhythmia on Linq monitor. Continue remote device checks.   Stroke: Recent admission for small acute infarct in the right posterior frontal white matter likely due to small vessel disease. Residual left-sided weakness. Emphasized importance of good BP control. No bleeding problems on Plavix and aspirin. Will d/c Plavix per neurology after 3 weeks.  Management per neurology.  Hypertension: Was advised to take olmesartan in  addition to Micardis by PCP for very elevated BP.  Kidney function and electrolytes are stable on lab work 05/08/2022, however we will discontinue olmesartan and have her monitor BP at home.  I have advised her to notify us if BP is consistently > 135/80 so that we can add an additional anti-hypertensive agent for better BP control without overly taxing her kidneys.  Continue amlodipine, Micardis.  Hyperlipidemia LDL goal < 70: LDL 105 on 05/08/22.  Recently started on rosuvastatin. Emphasized importance of goal < 100, preferably < 70 in the setting of prevention of CAD.  Will recheck lipids/lfts at follow-up appointment in 6 weeks  Disposition: 1-2 months with Dr. Bettina Gavia  Shared Decision Making/Informed Consent The risks [chest pain, shortness of breath, cardiac arrhythmias, dizziness, blood pressure fluctuations, myocardial infarction, stroke/transient ischemic attack, nausea, vomiting, allergic reaction, radiation exposure, metallic taste sensation and life-threatening complications (estimated to be 1 in 10,000)], benefits (risk stratification, diagnosing coronary artery disease, treatment guidance) and alternatives of a nuclear stress test were discussed in detail with Debbie Hatfield and she agrees to proceed.    Medication Adjustments/Labs and Tests Ordered: Current medicines are reviewed at length with the patient today.  Concerns regarding medicines are outlined above.  Orders Placed This Encounter  Procedures   Cardiac Stress Test: Informed Consent Details: Physician/Practitioner Attestation; Transcribe to consent form and obtain patient signature   Myocardial Perfusion Imaging   EKG 12-Lead   No orders of the defined types were placed in this encounter.   Patient Instructions  Medication Instructions:   DISCONTINUE Olmesartan  *If you need a refill on your cardiac medications before your next appointment, please call your pharmacy*   Lab Work:  Your physician recommends that you  return for a FASTING lipid profile/lft on the day you see Dr. Bettina Gavia in [facility].    If you have labs (blood  work) drawn today and your tests are completely normal, you will receive your results only by: MyChart Message (if you have MyChart) OR A paper copy in the mail If you have any lab test that is abnormal or we need to change your treatment, we will call you to review the results.   Testing/Procedures:  You are scheduled for a Myocardial Perfusion Imaging Study on Tuesday, August 8 at 8:00 am.   Please arrive 15 minutes prior to your appointment time for registration and insurance purposes.   The test will take approximately 3 to 4 hours to complete; you may bring reading material. If someone comes with you to your appointment, they will need to remain in the main lobby due to limited space in the testing area.   How to prepare for your Myocardial Perfusion test:   Do not eat or drink 3 hours prior to your test, except you may have water.    Do not consume products containing caffeine (regular or decaffeinated) 12 hours prior to your test (ex: coffee, chocolate, soda, tea)   Do bring a list of your current medications with you. If not listed below, you may take your medications as normal.   Bring any held medication to your appointment, as you may be required to take it once the test is complete.   Do wear comfortable clothes (no dresses or overalls) and walking shoes. Tennis shoes are preferred. No heels or open toed shoes.  Do not wear  perfume, or lotions (deodorant is allowed).   If these instructions are not followed, you test will have to be rescheduled.   Please report to [address] for your test. If you have questions or concerns about your appointment, please call the Nuclear Lab at #[phone].  If you cannot keep your appointment, please provide 24 hour notification to the Nuclear lab to avoid a possible $50 charge to your account.        Follow-Up: At [facility], you and your health needs are our priority.  As part of our continuing mission to provide you with exceptional heart care, we have created designated Provider Care Teams.  These Care Teams include your primary Cardiologist (physician) and Advanced Practice Providers (APPs -  Physician Assistants and Nurse Practitioners) who all work together to provide you with the care you need, when you need it.  We recommend signing up for the patient portal called "MyChart".  Sign up information is provided on this After Visit Summary.  MyChart is used to connect with patients for Virtual Visits (Telemedicine).  Patients are able to view lab/test results, encounter notes, upcoming appointments, etc.  Non-urgent messages can be sent to your provider as well.   To learn more about what you can do with MyChart, go to [email or web address].    Your next appointment:   1 month(s)  The format for your next appointment:   In Person  Provider:   Norman Herrlich, MD    Other Instructions HOW TO TAKE YOUR BLOOD PRESSURE: Rest 5 minutes before taking your blood pressure.  Don't smoke or drink caffeinated beverages for at least 30 minutes before. Take your blood pressure before (not after) you eat. Sit comfortably with your back supported and both feet on the floor (don't cross your legs). Elevate your arm to heart level on a table or a desk. Use the proper sized cuff. It should fit smoothly and snugly around your bare upper arm. There should be enough room  to slip a fingertip under the cuff. The bottom edge of the cuff should be 1 inch above the crease of the elbow.  If Blood pressure is consisently above 135/80 please call or send mychart message.   Important Information About Sugar         Signed, Levi Aland, NP  05/15/2022 12:47 PM    [facility]

## 2022-05-15 ENCOUNTER — Ambulatory Visit (INDEPENDENT_AMBULATORY_CARE_PROVIDER_SITE_OTHER): Payer: Medicare Other | Admitting: Nurse Practitioner

## 2022-05-15 ENCOUNTER — Encounter: Payer: Self-pay | Admitting: Nurse Practitioner

## 2022-05-15 VITALS — BP 138/62 | HR 66 | Ht 62.0 in | Wt 143.6 lb

## 2022-05-15 DIAGNOSIS — I10 Essential (primary) hypertension: Secondary | ICD-10-CM

## 2022-05-15 DIAGNOSIS — R079 Chest pain, unspecified: Secondary | ICD-10-CM | POA: Diagnosis not present

## 2022-05-15 DIAGNOSIS — E785 Hyperlipidemia, unspecified: Secondary | ICD-10-CM

## 2022-05-15 DIAGNOSIS — R55 Syncope and collapse: Secondary | ICD-10-CM

## 2022-05-15 DIAGNOSIS — Z8673 Personal history of transient ischemic attack (TIA), and cerebral infarction without residual deficits: Secondary | ICD-10-CM

## 2022-05-15 NOTE — Progress Notes (Signed)
Carelink Summary Report / Loop Recorder 

## 2022-05-15 NOTE — Patient Instructions (Signed)
Medication Instructions:   DISCONTINUE Olmesartan  *If you need a refill on your cardiac medications before your next appointment, please call your pharmacy*   Lab Work:  Your physician recommends that you return for a FASTING lipid profile/lft on the day you see Dr. Dulce Sellar in [facility].    If you have labs (blood work) drawn today and your tests are completely normal, you will receive your results only by: MyChart Message (if you have MyChart) OR A paper copy in the mail If you have any lab test that is abnormal or we need to change your treatment, we will call you to review the results.   Testing/Procedures:  You are scheduled for a Myocardial Perfusion Imaging Study on Tuesday, August 8 at 8:00 am.   Please arrive 15 minutes prior to your appointment time for registration and insurance purposes.   The test will take approximately 3 to 4 hours to complete; you may bring reading material. If someone comes with you to your appointment, they will need to remain in the main lobby due to limited space in the testing area.   How to prepare for your Myocardial Perfusion test:   Do not eat or drink 3 hours prior to your test, except you may have water.    Do not consume products containing caffeine (regular or decaffeinated) 12 hours prior to your test (ex: coffee, chocolate, soda, tea)   Do bring a list of your current medications with you. If not listed below, you may take your medications as normal.   Bring any held medication to your appointment, as you may be required to take it once the test is complete.   Do wear comfortable clothes (no dresses or overalls) and walking shoes. Tennis shoes are preferred. No heels or open toed shoes.  Do not wear  perfume, or lotions (deodorant is allowed).   If these instructions are not followed, you test will have to be rescheduled.   Please report to [address] for your test. If you have questions or concerns  about your appointment, please call the Nuclear Lab at #[phone].  If you cannot keep your appointment, please provide 24 hour notification to the Nuclear lab to avoid a possible $50 charge to your account.       Follow-Up: At [facility], you and your health needs are our priority.  As part of our continuing mission to provide you with exceptional heart care, we have created designated Provider Care Teams.  These Care Teams include your primary Cardiologist (physician) and Advanced Practice Providers (APPs -  Physician Assistants and Nurse Practitioners) who all work together to provide you with the care you need, when you need it.  We recommend signing up for the patient portal called "MyChart".  Sign up information is provided on this After Visit Summary.  MyChart is used to connect with patients for Virtual Visits (Telemedicine).  Patients are able to view lab/test results, encounter notes, upcoming appointments, etc.  Non-urgent messages can be sent to your provider as well.   To learn more about what you can do with MyChart, go to [email or web address].    Your next appointment:   1 month(s)  The format for your next appointment:   In Person  Provider:   Norman Herrlich, MD    Other Instructions HOW TO TAKE YOUR BLOOD PRESSURE: Rest 5 minutes before taking your blood pressure.  Don't smoke or drink caffeinated beverages for at least 30 minutes before.  Take your blood pressure before (not after) you eat. Sit comfortably with your back supported and both feet on the floor (don't cross your legs). Elevate your arm to heart level on a table or a desk. Use the proper sized cuff. It should fit smoothly and snugly around your bare upper arm. There should be enough room to slip a fingertip under the cuff. The bottom edge of the cuff should be 1 inch above the crease of the elbow.  If Blood pressure is consisently above 135/80 please call or send mychart message.   Important  Information About Sugar

## 2022-05-16 ENCOUNTER — Telehealth: Payer: Self-pay | Admitting: Internal Medicine

## 2022-05-16 ENCOUNTER — Telehealth: Payer: Self-pay

## 2022-05-16 DIAGNOSIS — Z0289 Encounter for other administrative examinations: Secondary | ICD-10-CM

## 2022-05-16 LAB — ALDOSTERONE + RENIN ACTIVITY W/ RATIO
ALDO / PRA Ratio: 1.7 Ratio (ref 0.9–28.9)
Aldosterone: 3 ng/dL
Renin Activity: 1.79 ng/mL/h (ref 0.25–5.82)

## 2022-05-16 NOTE — Telephone Encounter (Signed)
[facility] is needing a verbal order speech therapy 1 x week for 6 weeks.  Please advise

## 2022-05-16 NOTE — Telephone Encounter (Signed)
Dorian, a home health nurse, called for verbal orders for home care 1-2 times a week for 8 weeks.   Please call Dorian to confirm.  Dorian: [phone]

## 2022-05-16 NOTE — Telephone Encounter (Signed)
Verbal given for speech therapy. 

## 2022-05-16 NOTE — Telephone Encounter (Signed)
[employer] FML[employer] received via fax. Pt requesting continuous leave for post-stroke care.   Forms have been completed and given to PCP to review and sign.

## 2022-05-16 NOTE — Telephone Encounter (Signed)
[facility] is requesting an order for occupational therapy 2 times a week for 2 weeks 1 time a week for 6 weeks  Also wants a Child psychotherapist consult and an order for a home health aide.

## 2022-05-16 NOTE — Telephone Encounter (Signed)
Verbal given for HH orders.

## 2022-05-16 NOTE — Telephone Encounter (Signed)
Verbal order given for OT, social work consult and HH.

## 2022-05-16 NOTE — Telephone Encounter (Signed)
Forms have been signed by PCP  Copy sent to pt via mail Copy filed with CMA Copy sent to charge.

## 2022-05-17 ENCOUNTER — Ambulatory Visit: Payer: Medicare Other | Admitting: Internal Medicine

## 2022-05-17 ENCOUNTER — Other Ambulatory Visit: Payer: Self-pay | Admitting: Nurse Practitioner

## 2022-05-18 MED ORDER — HYDROCHLOROTHIAZIDE 12.5 MG PO CAPS: 12.5000 mg | ORAL_CAPSULE | Freq: Every day | ORAL | 11 refills | Status: DC

## 2022-05-24 LAB — CUP PACEART REMOTE DEVICE CHECK
Date Time Interrogation Session: 20230805230812
Implantable Pulse Generator Implant Date: 20220728

## 2022-05-25 ENCOUNTER — Encounter: Payer: Self-pay | Admitting: Internal Medicine

## 2022-05-25 ENCOUNTER — Telehealth: Payer: Self-pay | Admitting: Internal Medicine

## 2022-05-25 NOTE — Telephone Encounter (Signed)
Pt called to request she be discharged from [facility]. Claims they are unable to meet her needs, [facility] can't get an RN, PT or aid out to her in the next 3 weeks and she needs home aid as soon as possible.  Pt is requesting her referral be sent to [facility] instead.  Referral # [id number] [place]: [phone]

## 2022-05-28 ENCOUNTER — Other Ambulatory Visit: Payer: Self-pay | Admitting: Internal Medicine

## 2022-05-28 ENCOUNTER — Encounter: Payer: Self-pay | Admitting: Internal Medicine

## 2022-05-28 ENCOUNTER — Telehealth: Payer: Self-pay

## 2022-05-28 ENCOUNTER — Ambulatory Visit: Payer: Medicare Other

## 2022-05-28 DIAGNOSIS — I63311 Cerebral infarction due to thrombosis of right middle cerebral artery: Secondary | ICD-10-CM

## 2022-05-28 DIAGNOSIS — Z0289 Encounter for other administrative examinations: Secondary | ICD-10-CM

## 2022-05-28 NOTE — Telephone Encounter (Signed)
Form has been signed and faxed.  Copy sent to charge Copy sent to pt via mail as requested. Original filed with CMA

## 2022-05-28 NOTE — Telephone Encounter (Addendum)
Forms received from [employer] for disability.  Forms have been completed and given to PCP to sign.

## 2022-05-29 ENCOUNTER — Encounter: Payer: Self-pay | Admitting: Cardiology

## 2022-05-31 ENCOUNTER — Encounter: Payer: Medicare Other | Admitting: Occupational Therapy

## 2022-06-04 ENCOUNTER — Telehealth: Payer: Self-pay | Admitting: Internal Medicine

## 2022-06-04 ENCOUNTER — Ambulatory Visit: Payer: Medicare Other | Admitting: Internal Medicine

## 2022-06-04 NOTE — Telephone Encounter (Signed)
Verbal orders given  

## 2022-06-04 NOTE — Telephone Encounter (Signed)
Debbie Hatfield is requesting verbal orders for speech therapy once a week for 8 weeks.   Ok to leave a message  [phone] Debbie Hatfield

## 2022-06-07 ENCOUNTER — Other Ambulatory Visit: Payer: Self-pay | Admitting: Internal Medicine

## 2022-06-07 ENCOUNTER — Telehealth (HOSPITAL_COMMUNITY): Payer: Self-pay | Admitting: *Deleted

## 2022-06-07 DIAGNOSIS — F3341 Major depressive disorder, recurrent, in partial remission: Secondary | ICD-10-CM

## 2022-06-07 NOTE — Telephone Encounter (Signed)
Patient given detailed instructions per Myocardial Perfusion Study Information Sheet for the test on   06/12/22 Patient notified to arrive 15 minutes early and that it is imperative to arrive on time for appointment to keep from having the test rescheduled.  If you need to cancel or reschedule your appointment, please call the office within 24 hours of your appointment. . Patient verbalized understanding. Debbie Hatfield, Debbie Hatfield   

## 2022-06-08 ENCOUNTER — Telehealth: Payer: Self-pay | Admitting: *Deleted

## 2022-06-08 NOTE — Progress Notes (Signed)
Office Visit Note  Patient: Debbie Hatfield             Date of Birth: Jul 16, 1956           MRN: [id number]             PCP: Janith Lima, MD Referring: Binnie Rail, MD Visit Date: 06/22/2022 Occupation: @GUAROCC @  Subjective:  Pain in multiple joints and muscles  History of Present Illness: Debbie Hatfield is a 66 y.o. female seen in consultation per request of her PCP.  She is currently on disability due to recent a stroke.  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  According the patient her symptoms a started at age 55 with rash on her chest, pain in her legs and fatigue.  She was also experiencing generalized joint and muscle pain.  She states her husband was a physician and he did lab work.  She recalls that her sed rate was elevated and her rheumatoid factor was positive and her ANA was 1: 40.  She states that her husband gave her the diagnosis of systemic lupus and started her on prednisone.  She was on prednisone for about 90 days.  She also took some pain medications and muscle relaxers.  After 90 days her symptoms completely resolved and she discontinued prednisone.  She states 5 years later her husband passed away and she went to see Dr. Charlestine Night who was concerned about the diagnosis of lupus and placed her on hydroxychloroquine.  She states she was on hydroxychloroquine 200 mg twice daily for many years.  He also gave her trochanteric bursa injections.  After Dr. Truddie Coco retired she went to [facility] rheumatology where she continued hydroxychloroquine.  She states she did not have eye exams.  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  She stopped hydroxychloroquine and went to see a retina specialist who confirmed the ocular toxicity.  She was switched to Imuran which she took for about 4 weeks and discontinued due to muscle pain.  She states over the years she had episodes of  joint pain about twice a year for which she to was given prednisone tapers.  She has not taken any treatment for the last 8 months.  She states for the last 6 months she has been experiencing some weakness in her left lower extremity.  She initially thought it was due to working in the yard and doing her routine work on the surgical floor.  She states in the last 6 months she fell about 3 times due to left lower extremity weakness.  In July 2023 she drove to work and noticed that she had left facial droop and dysarthria.  She went to the emergency room where she underwent thrombectomy for a stroke.  Her symptoms reversed.  She was referred to Dr. Felecia Shelling.  She is undergoing work-up for possible multiple sclerosis.  She states she has been having pain in the right hip which radiates down into her leg.  She also has difficulty sleeping on the right side.  She was given Medrol Dosepak by her neurologist which did not help.  Baclofen 10 mg 3 times daily helps but it causes dizziness.  She has been on gabapentin and Ultram for many years.  She denies any history of joint swelling.  She has intermittent pain in her knee joints in her ankle  joints.  The pain moves around.  There is no history of oral ulcers, nasal ulcers.  She gives history of dry mouth and dry eyes which could be related to medication use.  There is no history of malar rash, Raynaud's phenomenon, lymphadenopathy or hair loss.  This history of Sjogren's in her knees and her maternal aunt.  She is gravida 0.  There is no history of DVTs.  Activities of Daily Living:  Patient reports morning stiffness for 1 hour.   Patient Reports nocturnal pain.  Difficulty dressing/grooming: Reports Difficulty climbing stairs: Reports Difficulty getting out of chair: Reports Difficulty using hands for taps, buttons, cutlery, and/or writing: Denies  Review of Systems  Constitutional:  Positive for fatigue.  HENT:  Positive for mouth dryness. Negative for mouth  sores.   Eyes:  Positive for dryness.  Respiratory:  Positive for shortness of breath.   Cardiovascular:  Positive for chest pain. Negative for palpitations.  Gastrointestinal:  Positive for constipation. Negative for blood in stool and diarrhea.  Endocrine: Negative for increased urination.  Genitourinary:  Negative for involuntary urination.  Musculoskeletal:  Positive for joint pain, joint pain, myalgias, muscle weakness, morning stiffness, muscle tenderness and myalgias. Negative for gait problem and joint swelling.  Skin:  Positive for sensitivity to sunlight. Negative for color change, rash and hair loss.  Allergic/Immunologic: Negative for susceptible to infections.  Neurological:  Positive for dizziness and headaches.  Hematological:  Negative for swollen glands.  Psychiatric/Behavioral:  Positive for depressed mood. Negative for sleep disturbance. The patient is nervous/anxious.     PMFS History:  Patient Active Problem List   Diagnosis Date Noted   Nonrheumatic mitral valve regurgitation 05/10/2022   Stroke (cerebrum) ([place]) 05/07/2022   Anemia, chronic disease 05/06/2022   Hyperlipidemia LDL goal <100 03/26/2022   Recurrent major depressive disorder, in partial remission ([place]) 11/15/2021   Need for shingles vaccine 11/15/2021   Visit for screening mammogram 11/15/2021   Need for vaccination 11/15/2021   GAD (generalized anxiety disorder) 11/15/2021   Stage 3a chronic kidney disease ([place]) 11/15/2021   Trigeminal neuralgia    Lupus ([place])    Irritable bowel disease    GERD (gastroesophageal reflux disease)    Hypertension    Gastroparesis    Degeneration of lumbar intervertebral disc 01/26/2020   Osteopenia 12/14/2015   Peptic ulcer disease 12/14/2015    Past Medical History:  Diagnosis Date   Closed fracture of right distal radius    Gastroparesis    GERD (gastroesophageal reflux disease)    Hypertension    Irritable bowel disease    Lupus ([place])    Major  depression    Stroke ([place])    Trigeminal neuralgia     Family History  Problem Relation Age of Onset   Heart block Mother        Pacemaker   Hypertension Mother    Depression Mother    Gout Mother    Macular degeneration Mother    Alcoholism Father    Congestive Heart Failure Maternal Grandmother    Diabetes Maternal Grandmother    Congestive Heart Failure Maternal Grandfather    Colon cancer Maternal Grandfather    Stroke Paternal Grandfather    Hypertension Paternal Grandfather    Past Surgical History:  Procedure Laterality Date   CHOLECYSTECTOMY     COLONOSCOPY     LOOP RECORDER IMPLANT  2021   ORIF WRIST FRACTURE Right 09/22/2020   Procedure: OPEN REDUCTION INTERNAL FIXATION (ORIF) WRIST FRACTURE;  Surgeon:  Ernest Mallick, MD;  Location: [facility];  Service: Orthopedics;  Laterality: Right;    Social History   Social History Narrative   06/13/22 living with her mother   Immunization History  Administered Date(s) Administered   Influenza,inj,Quad PF,6-35 Mos 06/17/2019   Influenza-Unspecified 09/02/2020, 07/27/2021   Moderna SARS-COV2 Booster Vaccination 09/02/2020   Moderna Sars-Covid-2 Vaccination 10/23/2019, 11/20/2019   PNEUMOCOCCAL CONJUGATE-20 11/15/2021   Tdap 03/28/2014, 12/29/2020     Objective: Vital Signs: BP (!) 143/80 (BP Location: Right Arm, Patient Position: Sitting, Cuff Size: Normal)   Pulse 84   Resp 15   Ht 5\' 2"  (1.575 m)   Wt 146 lb 9.6 oz (66.5 kg)   BMI 26.81 kg/m    Physical Exam Vitals and nursing note reviewed.  Constitutional:      Appearance: She is well-developed.  HENT:     Head: Normocephalic and atraumatic.  Eyes:     Conjunctiva/sclera: Conjunctivae normal.  Cardiovascular:     Rate and Rhythm: Normal rate and regular rhythm.     Heart sounds: Normal heart sounds.  Pulmonary:     Effort: Pulmonary effort is normal.     Breath sounds: Normal breath sounds.  Abdominal:     General: Bowel  sounds are normal.     Palpations: Abdomen is soft.  Musculoskeletal:     Cervical back: Normal range of motion.  Lymphadenopathy:     Cervical: No cervical adenopathy.  Skin:    General: Skin is warm and dry.     Capillary Refill: Capillary refill takes less than 2 seconds.  Neurological:     Mental Status: She is alert and oriented to person, place, and time.  Psychiatric:        Behavior: Behavior normal.      Musculoskeletal Exam: C-spine, thoracic and lumbar spine were in good range of motion.  Shoulder joints, elbow joints, wrist joints, MCPs PIPs and DIPs and good range of motion.  She has surgical scar over the volar aspect of her right wrist due to previous fracture.  She had bilateral PIP and DIP thickening.  Hip joints were in good range of motion.  She had tenderness over right trochanteric bursa.  Knee joints were in good range of motion without any warmth swelling or effusion.  There was no tenderness over ankles or MTPs.  No synovitis was noted.  CDAI Exam: CDAI Score: -- Patient Global: --; Provider Global: -- Swollen: --; Tender: -- Joint Exam 06/22/2022   No joint exam has been documented for this visit   There is currently no information documented on the homunculus. Go to the Rheumatology activity and complete the homunculus joint exam.  Investigation: No additional findings.  Imaging: Myocardial Perfusion Imaging  Result Date: 06/12/2022   The study is normal. The study is low risk.   No ST deviation was noted.   Left ventricular function is normal. Nuclear stress EF: 72 %. The left ventricular ejection fraction is hyperdynamic (>65%). End diastolic cavity size is normal.   Prior study not available for comparison.    Recent Labs: Lab Results  Component Value Date   WBC 5.3 05/08/2022   HGB 10.9 (L) 05/08/2022   PLT 294 05/08/2022   NA 141 05/08/2022   K 4.0 05/08/2022   CL 107 05/08/2022   CO2 26 05/08/2022   GLUCOSE 94 05/08/2022   BUN 18  05/08/2022   CREATININE 0.82 05/08/2022   BILITOT 0.7 05/07/2022   ALKPHOS  68 05/07/2022   AST 20 05/07/2022   ALT 18 05/07/2022   PROT 6.6 05/07/2022   ALBUMIN 3.9 05/07/2022   CALCIUM 8.7 (L) 05/08/2022   GFRAA >90 07/07/2012    Speciality Comments: No specialty comments available.  Procedures:  Large Joint Inj: R greater trochanter on 06/22/2022 11:50 AM Indications: pain Details: 27 G 1.5 in needle, lateral approach  Arthrogram: No  Medications: 40 mg triamcinolone acetonide 40 MG/ML; 1.5 mL lidocaine 1 % Aspirate: 0 mL Outcome: tolerated well, no immediate complications Procedure, treatment alternatives, risks and benefits explained, specific risks discussed. Consent was given by the patient. Immediately prior to procedure a time out was called to verify the correct patient, procedure, equipment, support staff and site/side marked as required. Patient was prepped and draped in the usual sterile fashion.     Allergies: Plaquenil [hydroxychloroquine], Sulfa antibiotics, and Penicillins   Assessment / Plan:     Visit Diagnoses: Lupus ([place]) -patient states that she was diagnosed with lupus at age 84 by her husband based on her positive ANA elevated sedimentation rate and fatigue and myalgias.  She was treated with prednisone for 90 days and her symptoms resolved.  She went to see Dr. Truddie Coco 5 years later and was placed on hydroxychloroquine due to previous history.  She had been on Hydroxychloroquine since then until 8 months ago.  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  She was tried on Imuran for 2 months which was discontinued due to muscle pain.  She gives history of fatigue, dry mouth and dry eyes.  There is no history of inflammatory arthritis, Raynaud's phenomenon, malar rash or lymphadenopathy.  I will obtain autoimmune labs today.  Plan: Sedimentation rate, Cyclic citrul peptide  antibody, IgG, ANA, Anti-scleroderma antibody, RNP Antibody, Anti-Mey antibody, Sjogrens syndrome-A extractable nuclear antibody, Sjogrens syndrome-B extractable nuclear antibody, Anti-DNA antibody, double-stranded, C3 and C4, Beta-2 glycoprotein antibodies, Cardiolipin antibodies, IgG, IgM, IgA, Lupus Anticoagulant Eval w/Reflex  High risk medication use - Plaquenil discontinued 8 months ago per patient due to retinal toxicity-30+years of use.  She was initially given prednisone for 90 days and since then she has had about 2 courses of prednisone a year.- Plan: CBC with Differential/Platelet, COMPLETE METABOLIC PANEL WITH GFR, Hepatitis B core antibody, IgM, Hepatitis B surface antigen, Hepatitis C antibody, QuantiFERON-TB Gold Plus, Serum protein electrophoresis with reflex, IgG, IgA, IgM  Degeneration of lumbar intervertebral disc-patient states that she was diagnosed with degenerative disc disease of lumbar spine several years ago.  She has intermittent discomfort in her lower back.  She was evaluated by Dr. Nelva Bush and had MRI of her lumbar spine.  Trochanteric bursitis, right hip-she had pain and tenderness on palpation of her right trochanteric bursa.  She is also having nocturnal pain and difficulty sleeping on the right side.  She was given a prednisone taper by Dr. Felecia Shelling without response.  She states the muscle relaxers help.  After different treatment options were discussed as patient was having severe pain and discomfort right trochanteric bursa was injected with lidocaine and Kenalog as described above.  She tolerated the procedure well.  Postprocedure instructions were given.  IT band stretches were advised.  Myalgia -she gives history of myalgias.  No muscular weakness or tenderness was noted.  Plan: CK  Chronic pain of both knees -she gives history of ongoing discomfort in her knee joints and ankle joints.  She states the joint pain  moves around.  No warmth swelling or effusion was noted.   Plan: Rheumatoid factor  Cerebrovascular accident (CVA) due to thrombosis of right middle cerebral artery (HCC)-she developed a stroke on May 07, 2022 with left facial droop and dysarthria.  She was treated with TNK therapy and had reversal of symptoms in 8 hours.  She is getting further work-up by Dr. Epimenio Foot.  She is on aspirin 81 mg p.o. daily and Plavix 75 mg a day.  Primary hypertension-pressure was mildly elevated today.  She was advised to monitor blood pressure closely.  Other medical problems are listed as follows:  Nonrheumatic mitral valve regurgitation  Hyperlipidemia LDL goal <100  Peptic ulcer disease  History of gastroesophageal reflux (GERD)  History of IBS  Gastroparesis  Trigeminal neuralgia  Stage 3a chronic kidney disease (HCC)  History of osteopenia  Anemia, chronic disease  Recurrent major depressive disorder, in partial remission (HCC)  GAD (generalized anxiety disorder)   Orders: Orders Placed This Encounter  Procedures   Large Joint Inj: R greater trochanter   CBC with Differential/Platelet   COMPLETE METABOLIC PANEL WITH GFR   CK   Sedimentation rate   Rheumatoid factor   Cyclic citrul peptide antibody, IgG   ANA   Anti-scleroderma antibody   RNP Antibody   Anti-Iden antibody   Sjogrens syndrome-A extractable nuclear antibody   Sjogrens syndrome-B extractable nuclear antibody   Anti-DNA antibody, double-stranded   C3 and C4   Beta-2 glycoprotein antibodies   Cardiolipin antibodies, IgG, IgM, IgA   Lupus Anticoagulant Eval w/Reflex   Hepatitis B core antibody, IgM   Hepatitis B surface antigen   Hepatitis C antibody   QuantiFERON-TB Gold Plus   Serum protein electrophoresis with reflex   IgG, IgA, IgM   No orders of the defined types were placed in this encounter.  Face-to-face time spent patient was over 60 minutes.  More than 50% time was spent and counseling and coordination of care.  Follow-Up Instructions: Return for  History of lupus, arthralgias.   Pollyann Savoy, MD  Note - This record has been created using Animal nutritionist.  Chart creation errors have been sought, but may not always  have been located. Such creation errors do not reflect on  the standard of medical care.

## 2022-06-08 NOTE — Telephone Encounter (Signed)
This patient was d/c 06/05/22 after 21 days for Hemiplegia and hemiparesis following cerebral infarction affecting left non-dominant side. Was told to f/u w/ MD in 2 weeks. You don't have anything until 06/26/22.Marland Kitchen is it ok to add somewhere..She already have hosp f/u w/ neurology on 8/30.Marland KitchenRaechel Chute

## 2022-06-11 NOTE — Telephone Encounter (Signed)
Called pt for [facility] call no answer  Transition Care Management Unsuccessful Follow-up Telephone Call  Date of discharge and from where:  [facility]  Attempts:  2nd Attempt  Reason for unsuccessful TCM follow-up call:  Left voice message

## 2022-06-12 ENCOUNTER — Ambulatory Visit (HOSPITAL_COMMUNITY): Payer: Medicare Other | Attending: Internal Medicine

## 2022-06-12 DIAGNOSIS — I10 Essential (primary) hypertension: Secondary | ICD-10-CM | POA: Insufficient documentation

## 2022-06-12 DIAGNOSIS — E785 Hyperlipidemia, unspecified: Secondary | ICD-10-CM | POA: Diagnosis present

## 2022-06-12 DIAGNOSIS — R079 Chest pain, unspecified: Secondary | ICD-10-CM | POA: Diagnosis present

## 2022-06-12 LAB — MYOCARDIAL PERFUSION IMAGING
LV dias vol: 53 mL (ref 46–106)
LV sys vol: 15 mL
Nuc Stress EF: 72 %
Peak HR: 81 {beats}/min
Rest HR: 57 {beats}/min
Rest Nuclear Isotope Dose: 10.6 mCi
SDS: 1
SRS: 0
SSS: 1
ST Depression (mm): 0 mm
Stress Nuclear Isotope Dose: 32.9 mCi
TID: 0.95

## 2022-06-12 MED ORDER — TECHNETIUM TC 99M TETROFOSMIN IV KIT
30.6000 | PACK | Freq: Once | INTRAVENOUS | Status: AC | PRN
  Administered 2022-06-12: 30.6 via INTRAVENOUS

## 2022-06-12 MED ORDER — REGADENOSON 0.4 MG/5ML IV SOLN
0.4000 mg | Freq: Once | INTRAVENOUS | Status: AC
  Administered 2022-06-12: 0.4 mg via INTRAVENOUS

## 2022-06-12 MED ORDER — TECHNETIUM TC 99M TETROFOSMIN IV KIT
10.4000 | PACK | Freq: Once | INTRAVENOUS | Status: AC | PRN
  Administered 2022-06-12: 10.4 via INTRAVENOUS

## 2022-06-13 ENCOUNTER — Ambulatory Visit (INDEPENDENT_AMBULATORY_CARE_PROVIDER_SITE_OTHER): Payer: Medicare Other | Admitting: Neurology

## 2022-06-13 ENCOUNTER — Telehealth: Payer: Self-pay | Admitting: Neurology

## 2022-06-13 ENCOUNTER — Encounter: Payer: Self-pay | Admitting: Neurology

## 2022-06-13 VITALS — BP 145/75 | HR 76 | Ht 62.0 in | Wt 144.6 lb

## 2022-06-13 DIAGNOSIS — R260 Ataxic gait: Secondary | ICD-10-CM

## 2022-06-13 DIAGNOSIS — R29898 Other symptoms and signs involving the musculoskeletal system: Secondary | ICD-10-CM

## 2022-06-13 DIAGNOSIS — M62838 Other muscle spasm: Secondary | ICD-10-CM

## 2022-06-13 DIAGNOSIS — I63311 Cerebral infarction due to thrombosis of right middle cerebral artery: Secondary | ICD-10-CM

## 2022-06-13 DIAGNOSIS — G500 Trigeminal neuralgia: Secondary | ICD-10-CM

## 2022-06-13 DIAGNOSIS — R9082 White matter disease, unspecified: Secondary | ICD-10-CM

## 2022-06-13 DIAGNOSIS — R0683 Snoring: Secondary | ICD-10-CM

## 2022-06-13 DIAGNOSIS — G4719 Other hypersomnia: Secondary | ICD-10-CM

## 2022-06-13 MED ORDER — BACLOFEN 10 MG PO TABS: 10.0000 mg | ORAL_TABLET | Freq: Three times a day (TID) | ORAL | 5 refills | Status: DC

## 2022-06-13 MED ORDER — METHYLPREDNISOLONE 4 MG PO TABS: ORAL_TABLET | ORAL | 0 refills | Status: DC

## 2022-06-13 NOTE — Progress Notes (Signed)
[facility]  PATIENT: KARNE OZGA DOB: [birth year]  REFERRING DOCTOR OR PCP: [name] Linger, MD (PCP); Elmer Picker, NP (referring provider) SOURCE: Patient, notes from hospital admission, imaging and lab reports, MRI and CT images personally reviewed.  _________________________________   HISTORICAL  CHIEF COMPLAINT:  Chief Complaint  Patient presents with   Cerebrovascular Accident    Rm 16 hospital FU, sister- French Ana, "getting ready to be d/c from PT/OT; want to discuss MRI report"      HISTORY OF PRESENT ILLNESS She was working as an Charity fundraiser.  She was driving to work 0/62/6948 3 AM when she noted her left face was drooping and she felt speech unable to speak. At first she had difficulty with word finding and slurring.  She stuttered a little.     She went to [employer] where she worked and another Engineer, civil (consulting) also noted the facial droop and speech issues.  Her tongue felt thick and she felt numb on the left side.Marland Kitchen  She went to the ED at [facility].  She had a CT, MRi and tenecteplase (TNK) thrombolytic therapy quickly.   She saw Dr. Roda Shutters.   After TNK therapy, facial weakness and speech started to improve 8 hours later.   She is seeing speech therapy and is near baseline now..     She noted some patchy visual changes around the time of the stroke x 2 week.       She notes mild cognitive issues since the stroke but not before.    She has phasic spasms of right leg that started after the stroke..  Tizanifine had not helped.    Thinking back in time, she has had several neurologic symptoms.  She had bells palsy many years ago and has left Trigeminal neuralgia x 30 years.  She has noted left leg weakness x 10 years that is progressing.  She can walk one hour but is slower than others.     The onset of the leg weakness was fairly sudden around 10 years ago.   Gait has slowly worsened.  She has 3 falls this year.  She has trouble getting up from the floor.     She has left  anterolateral thigh numbness.   She has visual changes due to Plaquenil toxicity (for SLE vs undifferentiated CTD).  She is currently on Plavix and ASA.   Dr. Roda Shutters had recommended DAPT x 3 weeks then ASA 81 mg daily.   She has had intense pain in the back and right leg.  She saw Dr. Ethelene Hal and had an MRI of the lumbar spine but does not know the result.   We have requested the images and report.  She has taken some steroid with benefit.  She takes tramadol and gabapentin 600 tid and 4 mg tizanidine.     Also on CBZ for trigeminal neuralgia.     She snores and has excessive daytime sleepiness.  She often dozes off watching TV and falls asleep easily during the day.   From Dr. Warren Danes notes: HOSPITAL COURSE Stroke: Small acute infarct in the right posterior frontal lobe s/p TNK per wake up protocol, etiology likely small vessel disease  Code Stroke CT head No acute abnormality.  CTA head & neck No LVO CT perfusion No  core or penumbra identified MRI Small acute infarct in the right posterior frontal white matter. Moderate scattered T2/FLAIR hyperintensities the white matter, which are nonspecific but most likely due to chronic microvascular ischemic  disease given the patient's risk factors. Chronic demyelination is thought less likely. 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 for evaluation.   Hypertension Home meds:  amlodipine, telmisartan- hydrochlorothiazide Normotensive is long term goal    Hyperlipidemia Home meds:  None LDL 105, goal < 70 Add crestor 20mg   Continue statin at discharge   Other Stroke Risk Factors Advanced Age >/= 76  Coronary artery disease Migraines   Other Active Problems Hx of Trigeminal Neuralgia affecting left side of face, on Tegretol, continue on discharge Depression- continue home medications     DISCHARGE PLAN Disposition:  Home with outpatient OT/PT aspirin 81 mg daily and clopidogrel 75 mg daily for secondary stroke prevention for 3 weeks then asprin alone. Ongoing stroke risk factor control by Primary Care Physician at time of discharge Follow-up PCP , MD in 2 weeks. Follow-up in [facility] in 8 weeks- follow up with Dr. 76  Imaging: MRI of the brain 05/07/2022 showed an acute stroke in the right frontoparietal region.  She has multiple T2/FLAIR hypertense foci.  Many are nonspecific but some are periventricular and radially oriented to the ventricles.  CT angiogram showed no large vessel occlusion or significant stenosis.  REVIEW OF SYSTEMS: Constitutional: No fevers, chills, sweats, or change in appetite Eyes: No visual changes, double vision, eye pain Ear, nose and throat: No hearing loss, ear pain, nasal congestion, sore throat Cardiovascular: No chest pain, palpitations Respiratory:  No shortness of breath at rest or with exertion.   No wheezes GastrointestinaI: No nausea, vomiting, diarrhea, abdominal pain, fecal incontinence Genitourinary:  No dysuria, urinary retention or frequency.  No nocturia. Musculoskeletal:  No neck pain, back pain Integumentary: No rash, pruritus, skin lesions Neurological: as above Psychiatric: No depression at this time.  No anxiety Endocrine: No palpitations, diaphoresis, change in appetite, change in weigh or increased thirst Hematologic/Lymphatic:  No anemia, purpura, petechiae. Allergic/Immunologic: No itchy/runny eyes, nasal congestion, recent  allergic reactions, rashes  ALLERGIES: Allergies  Allergen Reactions   Plaquenil [Hydroxychloroquine]     Retinal damage   Sulfa Antibiotics Nausea And Vomiting   Penicillins Rash    HOME MEDICATIONS:  Current Outpatient Medications:    acetaminophen (TYLENOL) 325 MG tablet, Take 325-650 mg by mouth every 6 (six) hours as needed for mild pain., Disp: , Rfl:    acyclovir (ZOVIRAX) 400 MG tablet, Take 1 tablet (400 mg total) by mouth daily., Disp: 90 tablet, Rfl: 0   amLODipine (NORVASC) 5 MG tablet, Take 1 tablet (5 mg total) by mouth daily., Disp: 90 tablet, Rfl: 0   aspirin EC 81 MG tablet, Take 1 tablet (81 mg total) by mouth daily. Swallow whole., Disp: 30 tablet, Rfl: 12   carbamazepine (CARBATROL) 300 MG 12 hr capsule, Take 1 capsule (300 mg total) by mouth daily., Disp: 90 capsule, Rfl: 0   clopidogrel (PLAVIX) 75 MG tablet, Take 1 tablet (75 mg total) by mouth daily., Disp: 30 tablet, Rfl: 1   esomeprazole (NEXIUM) 40 MG capsule, Take 40 mg by mouth Nightly.,  Disp: , Rfl:    gabapentin (NEURONTIN) 300 MG capsule, Take 600 mg by mouth 3 (three) times daily., Disp: , Rfl:    hydrochlorothiazide (MICROZIDE) 12.5 MG capsule, Take 1 capsule (12.5 mg total) by mouth daily., Disp: 30 capsule, Rfl: 11   hydrOXYzine (ATARAX) 10 MG tablet, Take 1 tablet (10 mg total) by mouth every 8 (eight) hours as needed., Disp: 270 tablet, Rfl: 0   rosuvastatin (CRESTOR) 20 MG tablet, Take 1 tablet (20 mg total) by mouth daily., Disp: 30 tablet, Rfl: 1   telmisartan-hydrochlorothiazide (MICARDIS HCT) 40-12.5 MG tablet, TAKE 1 TABLET BY MOUTH DAILY, Disp: 90 tablet, Rfl: 0   tizanidine (ZANAFLEX) 2 MG capsule, Take 4 mg by mouth at bedtime., Disp: , Rfl:    traMADol (ULTRAM) 50 MG tablet, Take 100 mg by mouth 3 (three) times daily., Disp: , Rfl:    venlafaxine XR (EFFEXOR-XR) 37.5 MG 24 hr capsule, TAKE 2 CAPSULES(75 MG) BY MOUTH DAILY WITH BREAKFAST, Disp: 180 capsule, Rfl: 1   predniSONE (DELTASONE) 5  MG tablet, Take 20 mg by mouth daily., Disp: , Rfl:   PAST MEDICAL HISTORY: Past Medical History:  Diagnosis Date   Closed fracture of right distal radius    Gastroparesis    GERD (gastroesophageal reflux disease)    Hypertension    Irritable bowel disease    Lupus (HCC)    Major depression    Stroke (HCC)    Trigeminal neuralgia     PAST SURGICAL HISTORY: Past Surgical History:  Procedure Laterality Date   CHOLECYSTECTOMY     COLONOSCOPY     ORIF WRIST FRACTURE Right 09/22/2020   Procedure: OPEN REDUCTION INTERNAL FIXATION (ORIF) WRIST FRACTURE;  Surgeon: Ernest Mallickreighton, James J III, MD;  Location: [facility];  Service: Orthopedics;  Laterality: Right;  90min    FAMILY HISTORY: Family History  Problem Relation Age of Onset   Heart block Mother        Pacemaker   Hypertension Mother    Depression Mother    Gout Mother    Macular degeneration Mother    Alcoholism Father    Congestive Heart Failure Maternal Grandmother    Diabetes Maternal Grandmother    Congestive Heart Failure Maternal Grandfather    Colon cancer Maternal Grandfather    Stroke Paternal Grandfather    Hypertension Paternal Grandfather     SOCIAL HISTORY:  Social History   Socioeconomic History   Marital status: Widowed    Spouse name: Not on file   Number of children: 0   Years of education: Not on file   Highest education level: Bachelor's degree (e.g., BA, AB, BS)  Occupational History    Comment: RN ortho/neuro at [employer]  Tobacco Use   Smoking status: Never    Passive exposure: Never   Smokeless tobacco: Never  Substance and Sexual Activity   Alcohol use: Yes    Comment: social   Drug use: Never   Sexual activity: Not Currently    Birth control/protection: Post-menopausal  Other Topics Concern   Not on file  Social History Narrative   06/13/22 living with her mother   Social Determinants of Health   Financial Resource Strain: Not on file  Food Insecurity: Not on file   Transportation Needs: Not on file  Physical Activity: Not on file  Stress: Not on file  Social Connections: Not on file  Intimate Partner Violence: Not on file     PHYSICAL EXAM  Vitals:   06/13/22 1435  BP: (!) 145/75  Pulse: 76  Weight: 144 lb 9.6 oz (65.6 kg)  Height: 5\' 2"  (1.575 m)    Body mass index is 26.45 kg/m.   General: The patient is well-developed and well-nourished and in no acute distress  HEENT:  Head is [place]/AT.  Sclera are anicteric.    Neck: No carotid bruits are noted.  The neck is nontender.  Cardiovascular: The heart has a regular rate and rhythm with a normal S1 and S2. There were no murmurs, gallops or rubs.    Skin: Extremities are without rash or  edema.  Musculoskeletal:  Back is nontender  Neurologic Exam  Mental status: The patient is alert and oriented x 3 at the time of the examination. The patient has apparent normal recent and remote memory, with an apparently normal attention span and concentration ability.   Speech is normal.  Cranial nerves: Extraocular movements are full. Pupils are equal, round, and reactive to light and accomodation.  Visual fields are full.  Facial symmetry is present.  She has reduced facial sensation on the left..Facial strength is normal.  Trapezius and sternocleidomastoid strength is normal. No dysarthria is noted.  The tongue is midline, and the patient has symmetric elevation of the soft palate. No obvious hearing deficits are noted.  Motor:  Muscle bulk is normal.   Tone is normal. Strength is  5 / 5 in the arms and right leg.  Strength was 4+/5 in the left EHL..   Sensory: Sensory testing is intact to pinprick, soft touch and vibration sensation in the arms but she had reduced sensation to touch in the distribution of the left lateral femoral cutaneous nerve..  Coordination: Cerebellar testing reveals good finger-nose-finger and heel-to-shin bilaterally.  Gait and station: Station is normal.   Gait is near  normal. Tandem gait is wide. Romberg is negative.   Reflexes: Deep tendon reflexes are symmetric and normal in the arms but increased at the knees with crossed adductors.  Ankles were 3.  No clonus..   Plantar responses are flexor.    DIAGNOSTIC DATA (LABS, IMAGING, TESTING) - I reviewed patient records, labs, notes, testing and imaging myself where available.  Lab Results  Component Value Date   WBC 5.3 05/08/2022   HGB 10.9 (L) 05/08/2022   HCT 33.4 (L) 05/08/2022   MCV 90.5 05/08/2022   PLT 294 05/08/2022      Component Value Date/Time   NA 141 05/08/2022 0424   K 4.0 05/08/2022 0424   CL 107 05/08/2022 0424   CO2 26 05/08/2022 0424   GLUCOSE 94 05/08/2022 0424   BUN 18 05/08/2022 0424   CREATININE 0.82 05/08/2022 0424   CALCIUM 8.7 (L) 05/08/2022 0424   PROT 6.6 05/07/2022 0905   ALBUMIN 3.9 05/07/2022 0905   AST 20 05/07/2022 0905   ALT 18 05/07/2022 0905   ALKPHOS 68 05/07/2022 0905   BILITOT 0.7 05/07/2022 0905   GFRNONAA >60 05/08/2022 0424   GFRAA >90 07/07/2012 0610   Lab Results  Component Value Date   CHOL 238 (H) 05/08/2022   HDL 109 05/08/2022   LDLCALC 105 (H) 05/08/2022   TRIG 119 05/08/2022   CHOLHDL 2.2 05/08/2022   Lab Results  Component Value Date   HGBA1C 5.7 (H) 05/08/2022   No results found for: "VITAMINB12" Lab Results  Component Value Date   TSH 1.45 11/15/2021       ASSESSMENT AND PLAN  Cerebrovascular accident (CVA) due to thrombosis of right middle cerebral artery (  HCC) - Plan: Ambulatory referral to Physical Therapy  Trigeminal neuralgia  White matter abnormality on MRI of brain - Plan: MR CERVICAL SPINE WO CONTRAST, MR THORACIC SPINE WO CONTRAST  Ataxic gait - Plan: MR CERVICAL SPINE WO CONTRAST, MR THORACIC SPINE WO CONTRAST  Left leg weakness - Plan: MR CERVICAL SPINE WO CONTRAST, MR THORACIC SPINE WO CONTRAST, Ambulatory referral to Physical Therapy  Muscle spasm of right leg  In summary, Ms. Troost is a  66 year old woman who had a stroke 05/07/2022.  Some of her symptoms but easy to explain based on the location of the stroke.  However, her phasic spasms in the right leg and increased deep tendon reflexes are not consistent.  The MRI of the brain with unusual and that besides the definite acute stroke she had multiple T2/FLAIR hyperintense foci.  Many were nonspecific but some had an appearance that could be consistent with multiple sclerosis.  Because of the MRI abnormality and the increased reflexes and phasic spasms of the right leg.  We need to check MRI of the cervical and thoracic spine.  This will help [place] rule out myelopathy as well as demyelinating lesion.  Based on the results, additional evaluation or treatment for demyelination may be needed.       For the stroke, she will switch from the combination of Plavix and aspirin to just aspirin as recommended by Dr. Roda Shutters.  I will also have her do outpatient physical therapy.  She has right leg pain that is not always associated with the phasic spasms and this pain was intense at times.  I will prescribe a steroid pack and I will try to get the results of the MRI of the lumbar spine.  She will continue carbamazepine and gabapentin.  For the spasticity I will have her try baclofen since she did not get a benefit from tizanidine.  Due to the history of stroke as well as snoring and EDS, we will check a home sleep study to determine if she has sleep apnea and treat if appropriate.  She will return to see me in 2 months or sooner based on the results of the studies.  She should also call if new or worsening symptoms.  Thank you for asked me to see Ms. Bialas.  Please let me know if I can be of further assistance with her or other patients in the future.  80-minute office visit with the majority of the time spent face-to-face for history and physical, discussion/counseling and decision-making.  Additional time with extensive record review and  documentation.  Ishan Sanroman A. Epimenio Foot, MD, [facility] 06/13/2022, 2:55 PM Certified in Neurology, Clinical Neurophysiology, Sleep Medicine and Neuroimaging  [facility] [address], [address] [place], [place] [address] [phone]

## 2022-06-13 NOTE — Telephone Encounter (Signed)
medicare/[place] sup NPR sent to GI  

## 2022-06-13 NOTE — Telephone Encounter (Signed)
FYI Dr. Epimenio Foot put in his check out notes for pt to be seen in two months but nothing was available until 10/31/22 at 2:45pm. Pt was scheduled for this time and added to the wait list

## 2022-06-13 NOTE — Telephone Encounter (Signed)
Please call pt and offer 08/27/22 at 1:30p with Dr. Epimenio Foot instead. Thank you

## 2022-06-14 ENCOUNTER — Telehealth: Payer: Self-pay | Admitting: Neurology

## 2022-06-14 NOTE — Telephone Encounter (Signed)
Referral sent to [facility] [phone]. ?

## 2022-06-14 NOTE — Telephone Encounter (Signed)
LVM asking Debbie Hatfield to call back about sooner appointment available for Dr. Epimenio Foot. If Debbie Hatfield calls back her appointment on 10/31/22 can be rescheduled for the held spot on 11/13 at 1:30 if Debbie Hatfield is available for that time

## 2022-06-15 ENCOUNTER — Encounter: Payer: Self-pay | Admitting: Neurology

## 2022-06-21 ENCOUNTER — Telehealth: Payer: Self-pay | Admitting: Neurology

## 2022-06-21 NOTE — Telephone Encounter (Signed)
[place]icare/[place] no auth req left VM to schedule 06/20/22 [place] LVM for pt to call back to schedule

## 2022-06-21 NOTE — Telephone Encounter (Signed)
HST- [place]/[employer] [place] no auth req.  Patient is scheduled at [facility] for 07/17/22 at 11 AM.  Mailed packet to the patient.

## 2022-06-22 ENCOUNTER — Ambulatory Visit: Payer: Medicare Other | Attending: Rheumatology | Admitting: Rheumatology

## 2022-06-22 ENCOUNTER — Encounter: Payer: Self-pay | Admitting: Rheumatology

## 2022-06-22 VITALS — BP 143/80 | HR 84 | Resp 15 | Ht 62.0 in | Wt 146.6 lb

## 2022-06-22 DIAGNOSIS — M7061 Trochanteric bursitis, right hip: Secondary | ICD-10-CM | POA: Diagnosis present

## 2022-06-22 DIAGNOSIS — I10 Essential (primary) hypertension: Secondary | ICD-10-CM

## 2022-06-22 DIAGNOSIS — M5136 Other intervertebral disc degeneration, lumbar region: Secondary | ICD-10-CM

## 2022-06-22 DIAGNOSIS — K279 Peptic ulcer, site unspecified, unspecified as acute or chronic, without hemorrhage or perforation: Secondary | ICD-10-CM | POA: Diagnosis present

## 2022-06-22 DIAGNOSIS — M25561 Pain in right knee: Secondary | ICD-10-CM | POA: Diagnosis present

## 2022-06-22 DIAGNOSIS — D638 Anemia in other chronic diseases classified elsewhere: Secondary | ICD-10-CM

## 2022-06-22 DIAGNOSIS — I63311 Cerebral infarction due to thrombosis of right middle cerebral artery: Secondary | ICD-10-CM | POA: Diagnosis present

## 2022-06-22 DIAGNOSIS — N1831 Chronic kidney disease, stage 3a: Secondary | ICD-10-CM

## 2022-06-22 DIAGNOSIS — F3341 Major depressive disorder, recurrent, in partial remission: Secondary | ICD-10-CM | POA: Diagnosis present

## 2022-06-22 DIAGNOSIS — I6350 Cerebral infarction due to unspecified occlusion or stenosis of unspecified cerebral artery: Secondary | ICD-10-CM

## 2022-06-22 DIAGNOSIS — E785 Hyperlipidemia, unspecified: Secondary | ICD-10-CM | POA: Diagnosis present

## 2022-06-22 DIAGNOSIS — M25562 Pain in left knee: Secondary | ICD-10-CM

## 2022-06-22 DIAGNOSIS — F411 Generalized anxiety disorder: Secondary | ICD-10-CM

## 2022-06-22 DIAGNOSIS — K3184 Gastroparesis: Secondary | ICD-10-CM | POA: Diagnosis present

## 2022-06-22 DIAGNOSIS — M791 Myalgia, unspecified site: Secondary | ICD-10-CM | POA: Diagnosis present

## 2022-06-22 DIAGNOSIS — Z8739 Personal history of other diseases of the musculoskeletal system and connective tissue: Secondary | ICD-10-CM | POA: Diagnosis present

## 2022-06-22 DIAGNOSIS — M329 Systemic lupus erythematosus, unspecified: Secondary | ICD-10-CM | POA: Diagnosis present

## 2022-06-22 DIAGNOSIS — G500 Trigeminal neuralgia: Secondary | ICD-10-CM

## 2022-06-22 DIAGNOSIS — G8929 Other chronic pain: Secondary | ICD-10-CM | POA: Diagnosis present

## 2022-06-22 DIAGNOSIS — Z8719 Personal history of other diseases of the digestive system: Secondary | ICD-10-CM | POA: Diagnosis present

## 2022-06-22 DIAGNOSIS — M51369 Other intervertebral disc degeneration, lumbar region without mention of lumbar back pain or lower extremity pain: Secondary | ICD-10-CM

## 2022-06-22 DIAGNOSIS — Z79899 Other long term (current) drug therapy: Secondary | ICD-10-CM | POA: Diagnosis present

## 2022-06-22 DIAGNOSIS — I340 Nonrheumatic mitral (valve) insufficiency: Secondary | ICD-10-CM | POA: Diagnosis present

## 2022-06-22 DIAGNOSIS — IMO0002 Reserved for concepts with insufficient information to code with codable children: Secondary | ICD-10-CM

## 2022-06-22 MED ORDER — LIDOCAINE HCL 1 % IJ SOLN
1.5000 mL | INTRAMUSCULAR | Status: AC | PRN
  Administered 2022-06-22: 1.5 mL

## 2022-06-22 MED ORDER — TRIAMCINOLONE ACETONIDE 40 MG/ML IJ SUSP
40.0000 mg | INTRAMUSCULAR | Status: AC | PRN
  Administered 2022-06-22: 40 mg via INTRA_ARTICULAR

## 2022-06-23 ENCOUNTER — Other Ambulatory Visit: Payer: Self-pay | Admitting: Internal Medicine

## 2022-06-23 DIAGNOSIS — I10 Essential (primary) hypertension: Secondary | ICD-10-CM

## 2022-06-24 ENCOUNTER — Ambulatory Visit
Admission: RE | Admit: 2022-06-24 | Discharge: 2022-06-24 | Disposition: A | Discharge status: Home / Self Care | Payer: Medicare Other | Source: Ambulatory Visit | Attending: Neurology | Admitting: Neurology

## 2022-06-24 ENCOUNTER — Other Ambulatory Visit: Payer: Self-pay | Admitting: Internal Medicine

## 2022-06-24 DIAGNOSIS — R29898 Other symptoms and signs involving the musculoskeletal system: Secondary | ICD-10-CM

## 2022-06-24 DIAGNOSIS — R260 Ataxic gait: Secondary | ICD-10-CM

## 2022-06-24 DIAGNOSIS — I10 Essential (primary) hypertension: Secondary | ICD-10-CM

## 2022-06-24 DIAGNOSIS — R9082 White matter disease, unspecified: Secondary | ICD-10-CM

## 2022-06-24 NOTE — H&P (View-Only) (Signed)
Cardiology Office Note:    Date:  06/25/2022   ID:  Debbie Hatfield, DOB [birth year], MRN [id number]  PCP:  Janith Lima, MD  Cardiologist:  Shirlee More, MD    Referring MD: Janith Lima, MD   Case reviewed with Dr. Kerman Passey and the patient and we will go ahead and set her up for TEE.  When I saw her in the office I obtained informed consent. ASSESSMENT:    1. Syncope, unspecified syncope type   2. Primary hypertension   3. Hyperlipidemia LDL goal <100   4. History of stroke    PLAN:    In order of problems listed above:  Patient was seen by me when she had a profound syncopal episode has implanted loop recorder and has had no bradycardia atrial fibrillation or flutter documented.  She inquired about explanting but I told her that in stroke of uncertain etiology with concern of embolic mechanism he can take months to capture an episode of atrial fibrillation I asked her to continue the loop recorder downloads. Stable good technique validated device blood pressure runs in the range of 130/70 or less at rest did not take her morning medications Continue her statin Waiting feedback by neurology asked her to send me a MyChart message she may require transesophageal echocardiogram for completeness   Next appointment: 6 months   Medication Adjustments/Labs and Tests Ordered: Current medicines are reviewed at length with the patient today.  Concerns regarding medicines are outlined above.  No orders of the defined types were placed in this encounter.  Meds ordered this encounter  Medications   pantoprazole (PROTONIX) 40 MG tablet    Sig: Take 1 tablet (40 mg total) by mouth daily.    Dispense:  90 tablet    Refill:  3    Follow-up for syncope she has implantable loop recorder she had a stroke in July   History of Present Illness:    Debbie Hatfield is a 66 y.o. female with a hx of hypertension and syncope last seen 01/12/2021.  Implanted loop recorder followed in our device  clinic and she has had no sick clinically important arrhythmia documented.  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  HOSPITAL COURSE Stroke: Small acute infarct in the right posterior frontal lobe s/p TNK per wake up protocol, etiology likely small vessel disease  Code Stroke CT head No acute abnormality.  CTA head & neck No LVO CT perfusion No  core or penumbra identified MRI Small acute infarct in the right posterior frontal white matter. Moderate scattered T2/FLAIR hyperintensities the white matter, which are nonspecific but most likely due to chronic microvascular ischemic disease given the patient's risk factors. Chronic demyelination is thought less likely. Imaging: MRI of the brain 05/07/2022 showed an acute stroke in the right frontoparietal region.  She has multiple T2/FLAIR hypertense foci.  Many are nonspecific but some are periventricular and radially oriented to the ventricles.  CT angiogram showed no large vessel occlusion or significant stenosis. 2D Echo EF 60-65% LDL 105 HgbA1c 5.7 VTE prophylaxis - SCDs No antithrombotic prior to admission, now on aspirin 81 mg daily and clopidogrel 75 mg daily for 3 weeks and then ASA 81mg  alone Therapy recommendations:  Outpatient follow up Disposition:  Home   Compliance with diet, lifestyle and medications: Yes  She is having esophageal symptoms taking over-the-counter PPI and will transition to prescription strength She  has occasional palpitation not severe or sustained has implanted loop recorder and we have not captured atrial fibrillation or flutter She is bothered now by pain and difficulty in gait in her right lower extremity and had MRI done yesterday reported as normal is awaiting follow-up by neurology. She may require transesophageal echocardiogram which was not done as an inpatient with stroke.  Echocardiogram performed 01/27/2021  showed grade 1 diastolic dysfunction EF 60 to 65% normal right ventricular size and function and no significant valvular or pericardial abnormality.  1. Left ventricular ejection fraction, by estimation, is 60 to 65%. The  left ventricle has normal function. The left ventricle has no regional  wall motion abnormalities. Left ventricular diastolic parameters are  consistent with Grade I diastolic  dysfunction (impaired relaxation).   2. Right ventricular systolic function is normal. The right ventricular  size is normal.   3. The mitral valve is normal in structure. Trivial mitral valve  regurgitation. No evidence of mitral stenosis.   4. The aortic valve is normal in structure. Aortic valve regurgitation is  trivial. Mild aortic valve sclerosis is present, with no evidence of  aortic valve stenosis.   5. The inferior vena cava is normal in size with greater than 50%  respiratory variability, suggesting right atrial pressure of 3 mmHg.   Lexiscan myocardial perfusion study 06/12/2022 low risk no EKG evidence of ischemia normal left ventricular function EF 72% with normal myocardial perfusion. Study Highlights   The study is normal. The study is low risk.   No ST deviation was noted.   Left ventricular function is normal. Nuclear stress EF: 72 %. The left ventricular ejection fraction is hyperdynamic (>65%). End diastolic cavity size is normal.   Prior study not available for comparison. Past Medical History:  Diagnosis Date   Closed fracture of right distal radius    Gastroparesis    GERD (gastroesophageal reflux disease)    Hypertension    Irritable bowel disease    Lupus ([place])    Major depression    Stroke [facility])    Trigeminal neuralgia     Past Surgical History:  Procedure Laterality Date   CHOLECYSTECTOMY     COLONOSCOPY     LOOP RECORDER IMPLANT  2021   ORIF WRIST FRACTURE Right 09/22/2020   Procedure: OPEN REDUCTION INTERNAL FIXATION (ORIF) WRIST FRACTURE;  Surgeon:  Verner Mould, MD;  Location: [place];  Service: Orthopedics;  Laterality: Right;  30min    Current Medications: Current Meds  Medication Sig   acetaminophen (TYLENOL) 325 MG tablet Take 325-650 mg by mouth every 6 (six) hours as needed for mild pain.   acyclovir (ZOVIRAX) 400 MG tablet Take 1 tablet (400 mg total) by mouth daily.   amLODipine (NORVASC) 5 MG tablet TAKE 1 TABLET(5 MG) BY MOUTH DAILY   aspirin EC 81 MG tablet Take 1 tablet (81 mg total) by mouth daily. Swallow whole.   baclofen (LIORESAL) 10 MG tablet Take 1 tablet (10 mg total) by mouth 3 (three) times daily.   carbamazepine (CARBATROL) 300 MG 12 hr capsule Take 1 capsule (300 mg total) by mouth daily.   dimenhyDRINATE (DRAMAMINE) 50 MG tablet Take 50 mg by mouth as needed.   esomeprazole (NEXIUM) 40 MG capsule Take 40 mg by mouth Nightly.   gabapentin (NEURONTIN) 300 MG capsule Take 300 mg by mouth 3 (three) times daily.   hydrochlorothiazide (MICROZIDE) 12.5 MG capsule Take 1 capsule (12.5 mg total) by mouth daily.  hydrOXYzine (ATARAX) 10 MG tablet Take 1 tablet (10 mg total) by mouth every 8 (eight) hours as needed.   meclizine (ANTIVERT) 25 MG tablet Take 25 mg by mouth 3 (three) times daily. Pt takes tid daily   pantoprazole (PROTONIX) 40 MG tablet Take 1 tablet (40 mg total) by mouth daily.   rosuvastatin (CRESTOR) 20 MG tablet Take 1 tablet (20 mg total) by mouth daily.   telmisartan-hydrochlorothiazide (MICARDIS HCT) 40-12.5 MG tablet TAKE 1 TABLET BY MOUTH DAILY   traMADol (ULTRAM) 50 MG tablet Take 50 mg by mouth 3 (three) times daily as needed for moderate pain.   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[employer]  Tobacco Use   Smoking status: Never    Passive exposure: Never   Smokeless tobacco: Never  Vaping Use   Vaping Use: Never used  Substance and Sexual Activity   Alcohol use: Yes    Comment: social   Drug use: Never   Sexual activity: Not Currently    Birth control/protection: Post-menopausal  Other Topics Concern   Not on file  Social History Narrative   06/13/22 living with her mother   Social Determinants of Health   Financial Resource Strain: Not on file  Food Insecurity: Not on file  Transportation Needs: Not on file  Physical Activity: Not on file  Stress: Not on file  Social Connections: Not on file     Family History: The patient's family history includes Alcoholism in her father; Colon cancer in her maternal grandfather; Congestive Heart Failure in her maternal grandfather and maternal grandmother; Depression in her mother; Diabetes in her maternal grandmother; Gout in her mother; Heart block in her mother; Hypertension in her mother and paternal grandfather; Macular degeneration in her mother; Stroke in her paternal grandfather. ROS:   Please see the history of present illness.    All other systems reviewed and are negative.  EKGs/Labs/Other Studies Reviewed:    The following studies were reviewed today:    Recent Labs: 11/15/2021: TSH 1.45 06/22/2022: ALT 21; BUN 20; Creat 0.97; Hemoglobin 11.5; Platelets 305; Potassium 4.1; Sodium 138  Recent Lipid Panel    Component Value Date/Time   CHOL 238 (H) 05/08/2022 0424   TRIG 119 05/08/2022 0424   HDL 109 05/08/2022 0424   CHOLHDL 2.2 05/08/2022 0424   VLDL 24 05/08/2022 0424   LDLCALC 105 (H) 05/08/2022 0424    Physical Exam:    VS:  BP (!) 173/72 (BP Location: Left Arm, Patient Position: Sitting)   Pulse 63   Ht 5\' 2"  (1.575 m)   Wt 145 lb 1.3 oz (65.8 kg)    SpO2 99%   BMI 26.54 kg/m     Wt Readings from Last 3 Encounters:  06/25/22 145 lb 1.3 oz (65.8 kg)  06/22/22 146 lb 9.6 oz (66.5 kg)  06/13/22 144 lb 9.6 oz (65.6 kg)     GEN:  Well nourished, well developed in no acute distress HEENT: Normal NECK: No JVD; No carotid bruits LYMPHATICS: No lymphadenopathy CARDIAC: RRR,  no murmurs, rubs, gallops RESPIRATORY:  Clear to auscultation without rales, wheezing or rhonchi  ABDOMEN: Soft, non-tender, non-distended MUSCULOSKELETAL:  No edema; No deformity  SKIN: Warm and dry NEUROLOGIC:  Alert and oriented x 3 PSYCHIATRIC:  Normal affect    Signed, Norman Herrlich, MD  06/25/2022 8:15 AM    [facility]

## 2022-06-24 NOTE — Progress Notes (Addendum)
Cardiology Office Note:    Date:  06/25/2022   ID:  Debbie Hatfield, DOB February 08, 1956, MRN [id number]  PCP:  Debbie Lima, MD  Cardiologist:  Debbie More, MD    Referring MD: Debbie Lima, MD   Case reviewed with Dr. Kerman Passey and the patient and we will go ahead and set her up for TEE.  When I saw her in the office I obtained informed consent. ASSESSMENT:    1. Syncope, unspecified syncope type   2. Primary hypertension   3. Hyperlipidemia LDL goal <100   4. History of stroke    PLAN:    In order of problems listed above:  Patient was seen by me when she had a profound syncopal episode has implanted loop recorder and has had no bradycardia atrial fibrillation or flutter documented.  She inquired about explanting but I told her that in stroke of uncertain etiology with concern of embolic mechanism he can take months to capture an episode of atrial fibrillation I asked her to continue the loop recorder downloads. Stable good technique validated device blood pressure runs in the range of 130/70 or less at rest did not take her morning medications Continue her statin Waiting feedback by neurology asked her to send me a MyChart message she may require transesophageal echocardiogram for completeness   Next appointment: 6 months   Medication Adjustments/Labs and Tests Ordered: Current medicines are reviewed at length with the patient today.  Concerns regarding medicines are outlined above.  No orders of the defined types were placed in this encounter.  Meds ordered this encounter  Medications   pantoprazole (PROTONIX) 40 MG tablet    Sig: Take 1 tablet (40 mg total) by mouth daily.    Dispense:  90 tablet    Refill:  3    Follow-up for syncope she has implantable loop recorder she had a stroke in July   History of Present Illness:    Debbie Hatfield is a 66 y.o. female with a hx of hypertension and syncope last seen 01/12/2021.  Implanted loop recorder followed in our device  clinic and she has had no sick clinically important arrhythmia documented.  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  HOSPITAL COURSE Stroke: Small acute infarct in the right posterior frontal lobe s/p TNK per wake up protocol, etiology likely small vessel disease  Code Stroke CT head No acute abnormality.  CTA head & neck No LVO CT perfusion No  core or penumbra identified MRI Small acute infarct in the right posterior frontal white matter. Moderate scattered T2/FLAIR hyperintensities the white matter, which are nonspecific but most likely due to chronic microvascular ischemic disease given the patient's risk factors. Chronic demyelination is thought less likely. Imaging: MRI of the brain 05/07/2022 showed an acute stroke in the right frontoparietal region.  She has multiple T2/FLAIR hypertense foci.  Many are nonspecific but some are periventricular and radially oriented to the ventricles.  CT angiogram showed no large vessel occlusion or significant stenosis. 2D Echo EF 60-65% LDL 105 HgbA1c 5.7 VTE prophylaxis - SCDs No antithrombotic prior to admission, now on aspirin 81 mg daily and clopidogrel 75 mg daily for 3 weeks and then ASA 81mg  alone Therapy recommendations:  Outpatient follow up Disposition:  Home   Compliance with diet, lifestyle and medications: Yes  She is having esophageal symptoms taking over-the-counter PPI and will transition to prescription strength She  has occasional palpitation not severe or sustained has implanted loop recorder and we have not captured atrial fibrillation or flutter She is bothered now by pain and difficulty in gait in her right lower extremity and had MRI done yesterday reported as normal is awaiting follow-up by neurology. She may require transesophageal echocardiogram which was not done as an inpatient with stroke.  Echocardiogram performed 01/27/2021  showed grade 1 diastolic dysfunction EF 60 to 65% normal right ventricular size and function and no significant valvular or pericardial abnormality.  1. Left ventricular ejection fraction, by estimation, is 60 to 65%. The  left ventricle has normal function. The left ventricle has no regional  wall motion abnormalities. Left ventricular diastolic parameters are  consistent with Grade I diastolic  dysfunction (impaired relaxation).   2. Right ventricular systolic function is normal. The right ventricular  size is normal.   3. The mitral valve is normal in structure. Trivial mitral valve  regurgitation. No evidence of mitral stenosis.   4. The aortic valve is normal in structure. Aortic valve regurgitation is  trivial. Mild aortic valve sclerosis is present, with no evidence of  aortic valve stenosis.   5. The inferior vena cava is normal in size with greater than 50%  respiratory variability, suggesting right atrial pressure of 3 mmHg.   Lexiscan myocardial perfusion study 06/12/2022 low risk no EKG evidence of ischemia normal left ventricular function EF 72% with normal myocardial perfusion. Study Highlights   The study is normal. The study is low risk.   No ST deviation was noted.   Left ventricular function is normal. Nuclear stress EF: 72 %. The left ventricular ejection fraction is hyperdynamic (>65%). End diastolic cavity size is normal.   Prior study not available for comparison. Past Medical History:  Diagnosis Date   Closed fracture of right distal radius    Gastroparesis    GERD (gastroesophageal reflux disease)    Hypertension    Irritable bowel disease    Lupus ([place])    Major depression    Stroke [facility])    Trigeminal neuralgia     Past Surgical History:  Procedure Laterality Date   CHOLECYSTECTOMY     COLONOSCOPY     LOOP RECORDER IMPLANT  2021   ORIF WRIST FRACTURE Right 09/22/2020   Procedure: OPEN REDUCTION INTERNAL FIXATION (ORIF) WRIST FRACTURE;  Surgeon:  Verner Mould, MD;  Location: [place];  Service: Orthopedics;  Laterality: Right;  54min    Current Medications: Current Meds  Medication Sig   acetaminophen (TYLENOL) 325 MG tablet Take 325-650 mg by mouth every 6 (six) hours as needed for mild pain.   acyclovir (ZOVIRAX) 400 MG tablet Take 1 tablet (400 mg total) by mouth daily.   amLODipine (NORVASC) 5 MG tablet TAKE 1 TABLET(5 MG) BY MOUTH DAILY   aspirin EC 81 MG tablet Take 1 tablet (81 mg total) by mouth daily. Swallow whole.   baclofen (LIORESAL) 10 MG tablet Take 1 tablet (10 mg total) by mouth 3 (three) times daily.   carbamazepine (CARBATROL) 300 MG 12 hr capsule Take 1 capsule (300 mg total) by mouth daily.   dimenhyDRINATE (DRAMAMINE) 50 MG tablet Take 50 mg by mouth as needed.   esomeprazole (NEXIUM) 40 MG capsule Take 40 mg by mouth Nightly.   gabapentin (NEURONTIN) 300 MG capsule Take 300 mg by mouth 3 (three) times daily.   hydrochlorothiazide (MICROZIDE) 12.5 MG capsule Take 1 capsule (12.5 mg total) by mouth daily.  hydrOXYzine (ATARAX) 10 MG tablet Take 1 tablet (10 mg total) by mouth every 8 (eight) hours as needed.   meclizine (ANTIVERT) 25 MG tablet Take 25 mg by mouth 3 (three) times daily. Pt takes tid daily   pantoprazole (PROTONIX) 40 MG tablet Take 1 tablet (40 mg total) by mouth daily.   rosuvastatin (CRESTOR) 20 MG tablet Take 1 tablet (20 mg total) by mouth daily.   telmisartan-hydrochlorothiazide (MICARDIS HCT) 40-12.5 MG tablet TAKE 1 TABLET BY MOUTH DAILY   traMADol (ULTRAM) 50 MG tablet Take 50 mg by mouth 3 (three) times daily as needed for moderate pain.   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[employer]  Tobacco Use   Smoking status: Never    Passive exposure: Never   Smokeless tobacco: Never  Vaping Use   Vaping Use: Never used  Substance and Sexual Activity   Alcohol use: Yes    Comment: social   Drug use: Never   Sexual activity: Not Currently    Birth control/protection: Post-menopausal  Other Topics Concern   Not on file  Social History Narrative   06/13/22 living with her mother   Social Determinants of Health   Financial Resource Strain: Not on file  Food Insecurity: Not on file  Transportation Needs: Not on file  Physical Activity: Not on file  Stress: Not on file  Social Connections: Not on file     Family History: The patient's family history includes Alcoholism in her father; Colon cancer in her maternal grandfather; Congestive Heart Failure in her maternal grandfather and maternal grandmother; Depression in her mother; Diabetes in her maternal grandmother; Gout in her mother; Heart block in her mother; Hypertension in her mother and paternal grandfather; Macular degeneration in her mother; Stroke in her paternal grandfather. ROS:   Please see the history of present illness.    All other systems reviewed and are negative.  EKGs/Labs/Other Studies Reviewed:    The following studies were reviewed today:    Recent Labs: 11/15/2021: TSH 1.45 06/22/2022: ALT 21; BUN 20; Creat 0.97; Hemoglobin 11.5; Platelets 305; Potassium 4.1; Sodium 138  Recent Lipid Panel    Component Value Date/Time   CHOL 238 (H) 05/08/2022 0424   TRIG 119 05/08/2022 0424   HDL 109 05/08/2022 0424   CHOLHDL 2.2 05/08/2022 0424   VLDL 24 05/08/2022 0424   LDLCALC 105 (H) 05/08/2022 0424    Physical Exam:    VS:  BP (!) 173/72 (BP Location: Left Arm, Patient Position: Sitting)   Pulse 63   Ht 5\' 2"  (1.575 m)   Wt 145 lb 1.3 oz (65.8 kg)    SpO2 99%   BMI 26.54 kg/m     Wt Readings from Last 3 Encounters:  06/25/22 145 lb 1.3 oz (65.8 kg)  06/22/22 146 lb 9.6 oz (66.5 kg)  06/13/22 144 lb 9.6 oz (65.6 kg)     GEN:  Well nourished, well developed in no acute distress HEENT: Normal NECK: No JVD; No carotid bruits LYMPHATICS: No lymphadenopathy CARDIAC: RRR,  no murmurs, rubs, gallops RESPIRATORY:  Clear to auscultation without rales, wheezing or rhonchi  ABDOMEN: Soft, non-tender, non-distended MUSCULOSKELETAL:  No edema; No deformity  SKIN: Warm and dry NEUROLOGIC:  Alert and oriented x 3 PSYCHIATRIC:  Normal affect    Signed, Norman Herrlich, MD  06/25/2022 8:15 AM    [facility]

## 2022-06-25 ENCOUNTER — Encounter: Payer: Self-pay | Admitting: Neurology

## 2022-06-25 ENCOUNTER — Encounter: Payer: Self-pay | Admitting: Cardiology

## 2022-06-25 ENCOUNTER — Ambulatory Visit: Payer: Medicare Other | Admitting: Internal Medicine

## 2022-06-25 ENCOUNTER — Ambulatory Visit: Payer: Medicare Other | Attending: Cardiology | Admitting: Cardiology

## 2022-06-25 VITALS — BP 173/72 | HR 63 | Ht 62.0 in | Wt 145.1 lb

## 2022-06-25 DIAGNOSIS — E785 Hyperlipidemia, unspecified: Secondary | ICD-10-CM | POA: Insufficient documentation

## 2022-06-25 DIAGNOSIS — I10 Essential (primary) hypertension: Secondary | ICD-10-CM | POA: Diagnosis present

## 2022-06-25 DIAGNOSIS — R55 Syncope and collapse: Secondary | ICD-10-CM | POA: Diagnosis present

## 2022-06-25 DIAGNOSIS — Z8673 Personal history of transient ischemic attack (TIA), and cerebral infarction without residual deficits: Secondary | ICD-10-CM

## 2022-06-25 MED ORDER — AMLODIPINE BESYLATE 5 MG PO TABS: 5.0000 mg | ORAL_TABLET | Freq: Every day | ORAL | 1 refills | Status: DC

## 2022-06-25 MED ORDER — PANTOPRAZOLE SODIUM 40 MG PO TBEC: 40.0000 mg | DELAYED_RELEASE_TABLET | Freq: Every day | ORAL | 3 refills | Status: DC

## 2022-06-25 NOTE — Patient Instructions (Signed)
Medication Instructions:  Your physician has recommended you make the following change in your medication:   STOP: OTC Prilosec START: Protonix 40 mg daily  *If you need a refill on your cardiac medications before your next appointment, please call your pharmacy*   Lab Work: None If you have labs (blood work) drawn today and your tests are completely normal, you will receive your results only by: MyChart Message (if you have MyChart) OR A paper copy in the mail If you have any lab test that is abnormal or we need to change your treatment, we will call you to review the results.   Testing/Procedures: None   Follow-Up: At [facility], you and your health needs are our priority.  As part of our continuing mission to provide you with exceptional heart care, we have created designated Provider Care Teams.  These Care Teams include your primary Cardiologist (physician) and Advanced Practice Providers (APPs -  Physician Assistants and Nurse Practitioners) who all work together to provide you with the care you need, when you need it.  We recommend signing up for the patient portal called "MyChart".  Sign up information is provided on this After Visit Summary.  MyChart is used to connect with patients for Virtual Visits (Telemedicine).  Patients are able to view lab/test results, encounter notes, upcoming appointments, etc.  Non-urgent messages can be sent to your provider as well.   To learn more about what you can do with MyChart, go to [email or web address].    Your next appointment:   6 month(s)  The format for your next appointment:   In Person  Provider:   Norman Herrlich, MD    Other Instructions None  Important Information About Sugar

## 2022-06-26 ENCOUNTER — Other Ambulatory Visit: Payer: Self-pay | Admitting: Internal Medicine

## 2022-06-26 ENCOUNTER — Encounter: Payer: Self-pay | Admitting: Cardiology

## 2022-06-26 ENCOUNTER — Encounter: Payer: Self-pay | Admitting: Neurology

## 2022-06-26 DIAGNOSIS — G500 Trigeminal neuralgia: Secondary | ICD-10-CM

## 2022-06-26 NOTE — Progress Notes (Signed)
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  She should take aspirin 325 mg p.o. daily.  She should also discuss with Dr. Epimenio Foot if she needs additional anticoagulation based on her history of recent a stroke.  Most of the other labs are pending.  We will discuss results at the follow-up visit.

## 2022-06-27 ENCOUNTER — Other Ambulatory Visit: Payer: Self-pay | Admitting: Neurology

## 2022-06-27 ENCOUNTER — Encounter: Payer: Self-pay | Admitting: Neurology

## 2022-06-27 LAB — COMPLETE METABOLIC PANEL WITH GFR
AG Ratio: 1.5 (calc) (ref 1.0–2.5)
ALT: 21 U/L (ref 6–29)
AST: 25 U/L (ref 10–35)
Albumin: 4.3 g/dL (ref 3.6–5.1)
Alkaline phosphatase (APISO): 72 U/L (ref 37–153)
BUN: 20 mg/dL (ref 7–25)
CO2: 24 mmol/L (ref 20–32)
Calcium: 9.6 mg/dL (ref 8.6–10.4)
Chloride: 102 mmol/L (ref 98–110)
Creat: 0.97 mg/dL (ref 0.50–1.05)
Globulin: 2.8 g/dL (calc) (ref 1.9–3.7)
Glucose, Bld: 102 mg/dL — ABNORMAL HIGH (ref 65–99)
Potassium: 4.1 mmol/L (ref 3.5–5.3)
Sodium: 138 mmol/L (ref 135–146)
Total Bilirubin: 0.5 mg/dL (ref 0.2–1.2)
Total Protein: 7.1 g/dL (ref 6.1–8.1)
eGFR: 65 mL/min/{1.73_m2} (ref >=60)

## 2022-06-27 LAB — CBC WITH DIFFERENTIAL/PLATELET
Absolute Monocytes: 570 cells/uL (ref 200–950)
Basophils Absolute: 11 cells/uL (ref 0–200)
Basophils Relative: 0.3 %
Eosinophils Absolute: 81 cells/uL (ref 15–500)
Eosinophils Relative: 2.2 %
HCT: 34.9 % — ABNORMAL LOW (ref 35.0–45.0)
Hemoglobin: 11.5 g/dL — ABNORMAL LOW (ref 11.7–15.5)
Lymphs Abs: 1391 cells/uL (ref 850–3900)
MCH: 29.7 pg (ref 27.0–33.0)
MCHC: 33 g/dL (ref 32.0–36.0)
MCV: 90.2 fL (ref 80.0–100.0)
MPV: 10 fL (ref 7.5–12.5)
Monocytes Relative: 15.4 %
Neutro Abs: 1647 cells/uL (ref 1500–7800)
Neutrophils Relative %: 44.5 %
Platelets: 305 10*3/uL (ref 140–400)
RBC: 3.87 10*6/uL (ref 3.80–5.10)
RDW: 13 % (ref 11.0–15.0)
Total Lymphocyte: 37.6 %
WBC: 3.7 10*3/uL — ABNORMAL LOW (ref 3.8–10.8)

## 2022-06-27 LAB — LUPUS ANTICOAGULANT EVAL W/ REFLEX
PTT-LA Screen: 29 s (ref <=40)
dRVVT: 31 s (ref <=45)

## 2022-06-27 LAB — PROTEIN ELECTROPHORESIS, SERUM, WITH REFLEX
Albumin ELP: 4.3 g/dL (ref 3.8–4.8)
Alpha 1: 0.3 g/dL (ref 0.2–0.3)
Alpha 2: 0.7 g/dL (ref 0.5–0.9)
Beta 2: 0.3 g/dL (ref 0.2–0.5)
Beta Globulin: 0.4 g/dL (ref 0.4–0.6)
Gamma Globulin: 1 g/dL (ref 0.8–1.7)
Total Protein: 7.1 g/dL (ref 6.1–8.1)

## 2022-06-27 LAB — QUANTIFERON-TB GOLD PLUS
Mitogen-NIL: >10 IU/mL
NIL: 0.08 IU/mL
QuantiFERON-TB Gold Plus: NEGATIVE
TB1-NIL: <0 IU/mL
TB2-NIL: <0 IU/mL

## 2022-06-27 LAB — C3 AND C4
C3 Complement: 145 mg/dL (ref 83–193)
C4 Complement: 23 mg/dL (ref 15–57)

## 2022-06-27 LAB — ANTI-SCLERODERMA ANTIBODY: Scleroderma (Scl-70) (ENA) Antibody, IgG: <1 AI

## 2022-06-27 LAB — IGG, IGA, IGM
IgG (Immunoglobin G), Serum: 1113 mg/dL (ref 600–1540)
IgM, Serum: 56 mg/dL (ref 50–300)
Immunoglobulin A: 243 mg/dL (ref 70–320)

## 2022-06-27 LAB — BETA-2 GLYCOPROTEIN ANTIBODIES
Beta-2 Glyco 1 IgA: <2 U/mL (ref <20.0)
Beta-2 Glyco 1 IgM: <2 U/mL (ref <20.0)
Beta-2 Glyco I IgG: >112 U/mL — ABNORMAL HIGH (ref <20.0)

## 2022-06-27 LAB — CARDIOLIPIN ANTIBODIES, IGG, IGM, IGA
Anticardiolipin IgA: <2 APL-U/mL (ref <20.0)
Anticardiolipin IgG: >112 GPL-U/mL — ABNORMAL HIGH (ref <20.0)
Anticardiolipin IgM: <2 MPL-U/mL (ref <20.0)

## 2022-06-27 LAB — HEPATITIS B SURFACE ANTIGEN: Hepatitis B Surface Ag: NONREACTIVE

## 2022-06-27 LAB — ANTI-NUCLEAR AB-TITER (ANA TITER): ANA Titer 1: 1:40 {titer} — ABNORMAL HIGH

## 2022-06-27 LAB — ANTI-DNA ANTIBODY, DOUBLE-STRANDED: ds DNA Ab: <1 IU/mL

## 2022-06-27 LAB — CK: Total CK: 39 U/L (ref 29–143)

## 2022-06-27 LAB — RNP ANTIBODY: Ribonucleic Protein(ENA) Antibody, IgG: <1 AI

## 2022-06-27 LAB — ANA: Anti Nuclear Antibody (ANA): POSITIVE — AB

## 2022-06-27 LAB — CYCLIC CITRUL PEPTIDE ANTIBODY, IGG: Cyclic Citrullin Peptide Ab: <16 UNITS

## 2022-06-27 LAB — HEPATITIS C ANTIBODY: Hepatitis C Ab: NONREACTIVE

## 2022-06-27 LAB — SJOGRENS SYNDROME-A EXTRACTABLE NUCLEAR ANTIBODY: SSA (Ro) (ENA) Antibody, IgG: <1 AI

## 2022-06-27 LAB — SEDIMENTATION RATE: Sed Rate: 6 mm/h (ref 0–30)

## 2022-06-27 LAB — SJOGRENS SYNDROME-B EXTRACTABLE NUCLEAR ANTIBODY: SSB (La) (ENA) Antibody, IgG: <1 AI

## 2022-06-27 LAB — HEPATITIS B CORE ANTIBODY, IGM: Hep B C IgM: NONREACTIVE

## 2022-06-27 LAB — ANTI-SMITH ANTIBODY: ENA SM Ab Ser-aCnc: <1 AI

## 2022-06-27 LAB — RHEUMATOID FACTOR: Rheumatoid fact SerPl-aCnc: <14 IU/mL (ref <14)

## 2022-06-27 MED ORDER — CLOPIDOGREL BISULFATE 75 MG PO TABS: 75.0000 mg | ORAL_TABLET | Freq: Every day | ORAL | 11 refills | Status: DC

## 2022-06-27 NOTE — Progress Notes (Signed)
I had a detailed conversation with the patient today.  She states that she had a peptic ulcer 40 years ago.  Although she does have reflux.  It may be better for her to go on Plavix and reduce the dose of aspirin to 81 mg p.o. daily.  I would also like to start her on hydroxychloroquine which she has taken for many years.  There was a question about hydroxychloroquine ocular toxicity.  She will see her retina specialist again if she can start hydroxychloroquine and if the ocular toxicity was confirmed or not. Thank you, Janalyn Rouse

## 2022-06-28 NOTE — Progress Notes (Signed)
Thank you :)

## 2022-06-29 ENCOUNTER — Telehealth: Payer: Self-pay

## 2022-06-29 ENCOUNTER — Other Ambulatory Visit: Payer: Self-pay

## 2022-06-29 DIAGNOSIS — R55 Syncope and collapse: Secondary | ICD-10-CM

## 2022-06-29 NOTE — Telephone Encounter (Signed)
Received a message from Dr. Dulce Sellar to schedule this patient for a TEE. Patient was scheduled for a TEE on 9/21 at noon. Lab orders were placed in Epic for a CBC and BMP. Instructions for the procedure were reviewed with the patient and she had no further questions at this time.

## 2022-06-29 NOTE — Telephone Encounter (Signed)
[facility] is calling for an update on paperwork requiring a signature and date for the PCP.   Forms were emailed. I printed them and placed in providers boxes.  Please advise

## 2022-06-29 NOTE — Addendum Note (Signed)
Addended by: Norman Herrlich on: 06/29/2022 03:39 PM   Modules accepted: Orders

## 2022-06-30 ENCOUNTER — Encounter: Payer: Self-pay | Admitting: Internal Medicine

## 2022-07-02 ENCOUNTER — Other Ambulatory Visit: Payer: Self-pay | Admitting: Internal Medicine

## 2022-07-02 ENCOUNTER — Ambulatory Visit (INDEPENDENT_AMBULATORY_CARE_PROVIDER_SITE_OTHER): Payer: Medicare Other

## 2022-07-02 ENCOUNTER — Telehealth: Payer: Self-pay | Admitting: *Deleted

## 2022-07-02 DIAGNOSIS — R55 Syncope and collapse: Secondary | ICD-10-CM | POA: Diagnosis not present

## 2022-07-02 DIAGNOSIS — A6000 Herpesviral infection of urogenital system, unspecified: Secondary | ICD-10-CM

## 2022-07-02 DIAGNOSIS — E785 Hyperlipidemia, unspecified: Secondary | ICD-10-CM

## 2022-07-02 MED ORDER — ROSUVASTATIN CALCIUM 20 MG PO TABS: 20.0000 mg | ORAL_TABLET | Freq: Every day | ORAL | 1 refills | Status: DC

## 2022-07-02 MED ORDER — ACYCLOVIR 400 MG PO TABS: 400.0000 mg | ORAL_TABLET | Freq: Every day | ORAL | 1 refills | Status: DC

## 2022-07-02 NOTE — Telephone Encounter (Signed)
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

## 2022-07-02 NOTE — Telephone Encounter (Addendum)
Forms were faxed back last week on 9/14.

## 2022-07-03 ENCOUNTER — Other Ambulatory Visit: Payer: Self-pay | Admitting: Rheumatology

## 2022-07-03 DIAGNOSIS — M329 Systemic lupus erythematosus, unspecified: Secondary | ICD-10-CM

## 2022-07-03 DIAGNOSIS — Z79899 Other long term (current) drug therapy: Secondary | ICD-10-CM

## 2022-07-03 LAB — BASIC METABOLIC PANEL
BUN/Creatinine Ratio: 14 (ref 12–28)
BUN: 15 mg/dL (ref 8–27)
CO2: 22 mmol/L (ref 20–29)
Calcium: 9.5 mg/dL (ref 8.7–10.3)
Chloride: 99 mmol/L (ref 96–106)
Creatinine, Ser: 1.09 mg/dL — ABNORMAL HIGH (ref 0.57–1.00)
Glucose: 94 mg/dL (ref 70–99)
Potassium: 3.8 mmol/L (ref 3.5–5.2)
Sodium: 137 mmol/L (ref 134–144)
eGFR: 56 mL/min/{1.73_m2} — ABNORMAL LOW (ref >59)

## 2022-07-03 LAB — CBC
Hematocrit: 36.5 % (ref 34.0–46.6)
Hemoglobin: 12.2 g/dL (ref 11.1–15.9)
MCH: 30.7 pg (ref 26.6–33.0)
MCHC: 33.4 g/dL (ref 31.5–35.7)
MCV: 92 fL (ref 79–97)
Platelets: 373 10*3/uL (ref 150–450)
RBC: 3.97 x10E6/uL (ref 3.77–5.28)
RDW: 13.1 % (ref 11.7–15.4)
WBC: 3.9 10*3/uL (ref 3.4–10.8)

## 2022-07-03 LAB — CUP PACEART REMOTE DEVICE CHECK
Date Time Interrogation Session: 20230917230649
Implantable Pulse Generator Implant Date: 20220728

## 2022-07-03 NOTE — Telephone Encounter (Signed)
I called patient and discussed that we would not be able to start her on  hydroxychloroquine.  I discussed the option of starting her on Imuran.  Patient plans to come on Friday to get labs including TPMT.  I will pend orders.

## 2022-07-05 ENCOUNTER — Ambulatory Visit (HOSPITAL_COMMUNITY)
Admission: RE | Admit: 2022-07-05 | Discharge: 2022-07-05 | Disposition: A | Discharge status: Home / Self Care | Payer: Medicare Other | Attending: Internal Medicine | Admitting: Internal Medicine

## 2022-07-05 ENCOUNTER — Ambulatory Visit (HOSPITAL_BASED_OUTPATIENT_CLINIC_OR_DEPARTMENT_OTHER)
Admission: RE | Admit: 2022-07-05 | Discharge: 2022-07-05 | Disposition: A | Discharge status: Home / Self Care | Payer: Medicare Other | Source: Ambulatory Visit | Attending: Cardiology | Admitting: Cardiology

## 2022-07-05 ENCOUNTER — Other Ambulatory Visit: Payer: Self-pay

## 2022-07-05 ENCOUNTER — Ambulatory Visit (HOSPITAL_BASED_OUTPATIENT_CLINIC_OR_DEPARTMENT_OTHER): Payer: Medicare Other | Admitting: Anesthesiology

## 2022-07-05 ENCOUNTER — Encounter (HOSPITAL_COMMUNITY): Payer: Self-pay | Admitting: Internal Medicine

## 2022-07-05 ENCOUNTER — Ambulatory Visit (HOSPITAL_COMMUNITY): Payer: Medicare Other | Admitting: Anesthesiology

## 2022-07-05 ENCOUNTER — Encounter (HOSPITAL_COMMUNITY)
Admission: RE | Disposition: A | Discharge status: Home / Self Care | Payer: Self-pay | Source: Home / Self Care | Attending: Internal Medicine

## 2022-07-05 DIAGNOSIS — N1831 Chronic kidney disease, stage 3a: Secondary | ICD-10-CM

## 2022-07-05 DIAGNOSIS — I10 Essential (primary) hypertension: Secondary | ICD-10-CM | POA: Diagnosis not present

## 2022-07-05 DIAGNOSIS — Z8673 Personal history of transient ischemic attack (TIA), and cerebral infarction without residual deficits: Secondary | ICD-10-CM | POA: Diagnosis not present

## 2022-07-05 DIAGNOSIS — I129 Hypertensive chronic kidney disease with stage 1 through stage 4 chronic kidney disease, or unspecified chronic kidney disease: Secondary | ICD-10-CM | POA: Diagnosis not present

## 2022-07-05 DIAGNOSIS — I639 Cerebral infarction, unspecified: Secondary | ICD-10-CM | POA: Diagnosis not present

## 2022-07-05 DIAGNOSIS — Q2112 Patent foramen ovale: Secondary | ICD-10-CM | POA: Diagnosis not present

## 2022-07-05 DIAGNOSIS — E785 Hyperlipidemia, unspecified: Secondary | ICD-10-CM | POA: Insufficient documentation

## 2022-07-05 DIAGNOSIS — I700 Atherosclerosis of aorta: Secondary | ICD-10-CM | POA: Diagnosis not present

## 2022-07-05 DIAGNOSIS — I3139 Other pericardial effusion (noninflammatory): Secondary | ICD-10-CM | POA: Diagnosis not present

## 2022-07-05 DIAGNOSIS — F418 Other specified anxiety disorders: Secondary | ICD-10-CM | POA: Diagnosis not present

## 2022-07-05 DIAGNOSIS — I350 Nonrheumatic aortic (valve) stenosis: Secondary | ICD-10-CM

## 2022-07-05 DIAGNOSIS — R55 Syncope and collapse: Secondary | ICD-10-CM | POA: Diagnosis not present

## 2022-07-05 HISTORY — PX: TEE WITHOUT CARDIOVERSION: SHX5443

## 2022-07-05 HISTORY — PX: BUBBLE STUDY: SHX6837

## 2022-07-05 LAB — ECHO TEE
AR max vel: 1.6 cm2
AV Area VTI: 1.83 cm2
AV Area mean vel: 1.7 cm2
AV Mean grad: 12 mmHg
AV Peak grad: 17.2 mmHg
Ao pk vel: 2.08 m/s

## 2022-07-05 SURGERY — ECHOCARDIOGRAM, TRANSESOPHAGEAL
Anesthesia: Monitor Anesthesia Care

## 2022-07-05 MED ORDER — PROPOFOL 500 MG/50ML IV EMUL
INTRAVENOUS | Status: DC | PRN
  Administered 2022-07-05: 150 ug/kg/min via INTRAVENOUS

## 2022-07-05 MED ORDER — SODIUM CHLORIDE 0.9 % IV SOLN: INTRAVENOUS | Status: DC | PRN

## 2022-07-05 MED ORDER — PROPOFOL 10 MG/ML IV BOLUS
INTRAVENOUS | Status: DC | PRN
  Administered 2022-07-05: 10 mg via INTRAVENOUS
  Administered 2022-07-05: 20 mg via INTRAVENOUS
  Administered 2022-07-05: 40 mg via INTRAVENOUS
  Administered 2022-07-05: 10 mg via INTRAVENOUS

## 2022-07-05 NOTE — Anesthesia Preprocedure Evaluation (Signed)
Anesthesia Evaluation  Patient identified by MRN, date of birth, ID band Patient awake    Reviewed: Allergy & Precautions, NPO status , Patient's Chart, lab work & pertinent test results  History of Anesthesia Complications Negative for: history of anesthetic complications  Airway Mallampati: II  TM Distance: >3 FB Neck ROM: Full    Dental  (+) Dental Advisory Given, Teeth Intact   Pulmonary neg pulmonary ROS,    breath sounds clear to auscultation       Cardiovascular hypertension, Pt. on medications (-) angina Rhythm:Regular     Neuro/Psych PSYCHIATRIC DISORDERS Anxiety Depression  Neuromuscular disease CVA, No Residual Symptoms    GI/Hepatic Neg liver ROS, PUD, GERD  Medicated and Controlled,  Endo/Other  negative endocrine ROS  Renal/GU CRFRenal diseaseLab Results      Component                Value               Date                      CREATININE               1.09 (H)            07/02/2022                Musculoskeletal  (+) Arthritis ,   Abdominal   Peds  Hematology  (+) Blood dyscrasia, , Lab Results      Component                Value               Date                      WBC                      3.9                 07/02/2022                HGB                      12.2                07/02/2022                HCT                      36.5                07/02/2022                MCV                      92                  07/02/2022                PLT                      373                 07/02/2022           plavix   Anesthesia Other Findings   Reproductive/Obstetrics  Anesthesia Physical Anesthesia Plan  ASA: 2  Anesthesia Plan: MAC   Post-op Pain Management: Minimal or no pain anticipated   Induction: Intravenous  PONV Risk Score and Plan: 2 and Propofol infusion and Treatment may vary due to age or medical condition  Airway  Management Planned: Nasal Cannula and Natural Airway  Additional Equipment: None  Intra-op Plan:   Post-operative Plan:   Informed Consent: I have reviewed the patients History and Physical, chart, labs and discussed the procedure including the risks, benefits and alternatives for the proposed anesthesia with the patient or authorized representative who has indicated his/her understanding and acceptance.     Dental advisory given  Plan Discussed with: CRNA  Anesthesia Plan Comments:         Anesthesia Quick Evaluation

## 2022-07-05 NOTE — Interval H&P Note (Signed)
History and Physical Interval Note:  07/05/2022 10:49 AM  Debbie Hatfield  has presented today for surgery, with the diagnosis of stroke.  The various methods of treatment have been discussed with the patient and family. After consideration of risks, benefits and other options for treatment, the patient has consented to  Procedure(s): TRANSESOPHAGEAL ECHOCARDIOGRAM (TEE) (N/A) as a surgical intervention.  The patient's history has been reviewed, patient examined, no change in status, stable for surgery.  I have reviewed the patient's chart and labs.  Questions were answered to the patient's satisfaction.     Elouise Munroe

## 2022-07-05 NOTE — CV Procedure (Signed)
INDICATIONS: Stroke evaluation  PROCEDURE:   Informed consent was obtained prior to the procedure. The risks, benefits and alternatives for the procedure were discussed and the patient comprehended these risks.  Risks include, but are not limited to, cough, sore throat, vomiting, nausea, somnolence, esophageal and stomach trauma or perforation, bleeding, low blood pressure, aspiration, pneumonia, infection, trauma to the teeth and death.    After a procedural time-out, the oropharynx was anesthetized with 20% benzocaine spray.   During this procedure the patient was administered propofol per anesthesia.  The patient's heart rate, blood pressure, and oxygen saturation were monitored continuously during the procedure. The period of conscious sedation was 20 minutes, of which I was present face-to-face 100% of this time.  The transesophageal probe was inserted in the esophagus and stomach without difficulty and multiple views were obtained.  The patient was kept under observation until the patient left the procedure room.  The patient left the procedure room in stable condition.   Agitated microbubble saline contrast was administered.  COMPLICATIONS:    There were no immediate complications.  FINDINGS:   FORMAL ECHOCARDIOGRAM REPORT PENDING LVEF 65-70%. RV normal size and function. PFO with bidirectional shunt by color flow and agitated saline.  No LA or LA appendage thrombus.  Mild aortic valve stenosis, mean gradient approximately 12 mmHg. Mild aortic valve calcification. Trivial MR, TR, AI, PR.   RECOMMENDATIONS:     D/c when alert.  Time Spent Directly with the Patient:  45 minutes   Elouise Munroe 07/05/2022, 12:19 PM

## 2022-07-05 NOTE — Anesthesia Procedure Notes (Addendum)
Procedure Name: General with mask airway Date/Time: 07/05/2022 11:52 AM  Performed by: Erick Colace, CRNAPre-anesthesia Checklist: Patient identified, Emergency Drugs available, Suction available and Patient being monitored Patient Re-evaluated:Patient Re-evaluated prior to induction Oxygen Delivery Method: Nasal cannula Preoxygenation: Pre-oxygenation with 100% oxygen Induction Type: IV induction Airway Equipment and Method: Bite block Dental Injury: Teeth and Oropharynx as per pre-operative assessment

## 2022-07-05 NOTE — Transfer of Care (Signed)
Immediate Anesthesia Transfer of Care Note  Patient: Debbie Hatfield  Procedure(s) Performed: TRANSESOPHAGEAL ECHOCARDIOGRAM (TEE) BUBBLE STUDY  Patient Location: Endoscopy Unit  Anesthesia Type:MAC  Level of Consciousness: awake, alert  and oriented  Airway & Oxygen Therapy: Patient Spontanous Breathing  Post-op Assessment: Report given to RN and Post -op Vital signs reviewed and stable  Post vital signs: Reviewed and stable  Last Vitals:  Vitals Value Taken Time  BP 146/83 07/05/22 1220  Temp 36.6 C 07/05/22 1220  Pulse 82 07/05/22 1222  Resp 12 07/05/22 1222  SpO2 100 % 07/05/22 1222  Vitals shown include unvalidated device data.  Last Pain:  Vitals:   07/05/22 1220  TempSrc: Oral  PainSc: 0-No pain         Complications: No notable events documented.

## 2022-07-06 NOTE — Anesthesia Postprocedure Evaluation (Signed)
Anesthesia Post Note  Patient: Debbie Hatfield  Procedure(s) Performed: TRANSESOPHAGEAL ECHOCARDIOGRAM (TEE) BUBBLE STUDY     Patient location during evaluation: Endoscopy Anesthesia Type: MAC Level of consciousness: awake and alert Pain management: pain level controlled Vital Signs Assessment: post-procedure vital signs reviewed and stable Respiratory status: spontaneous breathing, nonlabored ventilation and respiratory function stable Cardiovascular status: stable and blood pressure returned to baseline Postop Assessment: no apparent nausea or vomiting Anesthetic complications: no   No notable events documented.  Last Vitals:  Vitals:   07/05/22 1230 07/05/22 1240  BP: (!) 153/73 (!) 162/78  Pulse: 78 74  Resp: 15 15  Temp:    SpO2: 100% 100%    Last Pain:  Vitals:   07/05/22 1240  TempSrc:   PainSc: 0-No pain                 Azaliyah Kennard

## 2022-07-07 ENCOUNTER — Encounter: Payer: Self-pay | Admitting: Neurology

## 2022-07-08 ENCOUNTER — Encounter (HOSPITAL_COMMUNITY): Payer: Self-pay | Admitting: Internal Medicine

## 2022-07-09 ENCOUNTER — Ambulatory Visit: Payer: Medicare Other | Admitting: Internal Medicine

## 2022-07-10 ENCOUNTER — Telehealth: Payer: Self-pay | Admitting: Internal Medicine

## 2022-07-10 NOTE — Telephone Encounter (Signed)
Patient dropped off form to be filled out, I placed in providers box up front. Patient requested a call back at [phone] when done

## 2022-07-11 NOTE — Progress Notes (Addendum)
Office Visit Note  Patient: Debbie Hatfield             Date of Birth: 08-Mar-1956           MRN: [id number]             PCP: Janith Lima, MD Referring: Janith Lima, MD Visit Date: 07/20/2022 Occupation: @GUAROCC @  Subjective:  Patient management  History of Present Illness: Debbie Hatfield is a 66 y.o. 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  She states she is still has difficulty with word finding and in vision.  She is unable to return back to work.  She still has some difficulty walking.  She denies any history of recent oral ulcers.  She continues to have dry mouth.  There is no history of Raynaud's phenomenon, lymphadenopathy, malar rash or photosensitivity.  There is no history of inflammatory arthritis.  She was started on Plavix by Dr. Felecia Shelling due to positive anticardiolipin antibody and beta-2 GP 1 antibodies.Patient clarified today that initially she had intolerance to methotrexate.  She states she was taking methotrexate 3 tablets p.o. weekly and developed muscle cramps.  She discontinued methotrexate.  At the last visit she mentioned Imuran.  She has never taken Imuran.   Activities of Daily Living:  Patient reports morning stiffness for all day. Patient Reports nocturnal pain.  Difficulty dressing/grooming: Reports Difficulty climbing stairs: Reports Difficulty getting out of chair: Reports Difficulty using hands for taps, buttons, cutlery, and/or writing: Denies  Review of Systems  Constitutional:  Positive for fatigue.  HENT:  Positive for mouth sores and mouth dryness.   Eyes:  Negative for dryness.  Respiratory:  Negative for shortness of breath.   Cardiovascular:  Positive for palpitations. Negative for chest pain.  Gastrointestinal:  Positive for constipation and diarrhea. Negative for blood in stool.  Endocrine: Negative for increased urination.  Genitourinary:  Negative for involuntary urination.   Musculoskeletal:  Positive for myalgias, muscle weakness, morning stiffness, muscle tenderness and myalgias. Negative for joint pain, gait problem, joint pain and joint swelling.  Skin:  Negative for color change, rash, hair loss and sensitivity to sunlight.  Allergic/Immunologic: Negative for susceptible to infections.  Neurological:  Negative for dizziness and headaches.  Hematological:  Negative for swollen glands.  Psychiatric/Behavioral:  Positive for depressed mood and sleep disturbance. The patient is nervous/anxious.     PMFS History:  Patient Active Problem List   Diagnosis Date Noted   Nonrheumatic mitral valve regurgitation 05/10/2022   Stroke (cerebrum) ([place]) 05/07/2022   Anemia, chronic disease 05/06/2022   Hyperlipidemia LDL goal <100 03/26/2022   Recurrent major depressive disorder, in partial remission ([place]) 11/15/2021   Need for shingles vaccine 11/15/2021   Visit for screening mammogram 11/15/2021   Need for vaccination 11/15/2021   GAD (generalized anxiety disorder) 11/15/2021   Stage 3a chronic kidney disease ([place]) 11/15/2021   Trigeminal neuralgia    Lupus ([place])    Irritable bowel disease    GERD (gastroesophageal reflux disease)    Hypertension    Gastroparesis    Degeneration of lumbar intervertebral disc 01/26/2020   Osteopenia 12/14/2015   Peptic ulcer disease 12/14/2015    Past Medical History:  Diagnosis Date   Closed fracture of right distal radius    Gastroparesis    GERD (gastroesophageal reflux disease)    Hypertension    Irritable bowel disease    Lupus ([place])    Major depression  Stroke [facility])    Trigeminal neuralgia     Family History  Problem Relation Age of Onset   Heart block Mother        Pacemaker   Hypertension Mother    Depression Mother    Gout Mother    Macular degeneration Mother    Alcoholism Father    Congestive Heart Failure Maternal Grandmother    Diabetes Maternal Grandmother    Congestive Heart Failure Maternal  Grandfather    Colon cancer Maternal Grandfather    Stroke Paternal Grandfather    Hypertension Paternal Grandfather    Past Surgical History:  Procedure Laterality Date   BUBBLE STUDY  07/05/2022   Procedure: BUBBLE STUDY;  Surgeon: Elouise [place], MD;  Location: [place];  Service: Cardiology;;   CHOLECYSTECTOMY     COLONOSCOPY     LOOP RECORDER IMPLANT  2021   ORIF WRIST FRACTURE Right 09/22/2020   Procedure: OPEN REDUCTION INTERNAL FIXATION (ORIF) WRIST FRACTURE;  Surgeon: Verner Mould, MD;  Location: [place];  Service: Orthopedics;  Laterality: Right;  60min   TEE WITHOUT CARDIOVERSION N/A 07/05/2022   Procedure: TRANSESOPHAGEAL ECHOCARDIOGRAM (TEE);  Surgeon: Elouise [place]roe, MD;  Location: [place];  Service: Cardiology;  Laterality: N/A;   Social History   Social History Narrative   06/13/22 living with her mother   Immunization History  Administered Date(s) Administered   Influenza,inj,Quad PF,6-35 Mos 06/17/2019   Influenza-Unspecified 09/02/2020, 07/27/2021   Moderna SARS-COV2 Booster Vaccination 09/02/2020   Moderna Sars-Covid-2 Vaccination 10/23/2019, 11/20/2019   PNEUMOCOCCAL CONJUGATE-20 11/15/2021   Tdap 03/28/2014, 12/29/2020     Objective: Vital Signs: BP (!) 145/82 (BP Location: Right Arm, Patient Position: Sitting, Cuff Size: Normal)   Pulse 82   Resp 15   Ht 5\' 2"  (1.575 m)   Wt 147 lb 3.2 oz (66.8 kg)   BMI 26.92 kg/m    Physical Exam Vitals and nursing note reviewed.  Constitutional:      Appearance: She is well-developed.  HENT:     Head: Normocephalic and atraumatic.  Eyes:     Conjunctiva/sclera: Conjunctivae normal.  Cardiovascular:     Rate and Rhythm: Normal rate and regular rhythm.     Heart sounds: Normal heart sounds.  Pulmonary:     Effort: Pulmonary effort is normal.     Breath sounds: Normal breath sounds.  Abdominal:     General: Bowel sounds are normal.     Palpations: Abdomen is  soft.  Musculoskeletal:     Cervical back: Normal range of motion.  Lymphadenopathy:     Cervical: No cervical adenopathy.  Skin:    General: Skin is warm and dry.     Capillary Refill: Capillary refill takes less than 2 seconds.  Neurological:     Mental Status: She is alert and oriented to person, place, and time.  Psychiatric:        Behavior: Behavior normal.      Musculoskeletal Exam: Cervical, thoracic and lumbar spine were in good range of motion.  Shoulder joints, elbow joints, wrist joints, MCPs PIPs and DIPs been good range of motion with no synovitis.  Hip joints and knee joints with good range of motion.  There was no tenderness over ankles or MTPs.  No synovitis was noted.  CDAI Exam: CDAI Score: -- Patient Global: --; Provider Global: -- Swollen: --; Tender: -- Joint Exam 07/20/2022   No joint exam has been documented for this visit   There is  currently no information documented on the homunculus. Go to the Rheumatology activity and complete the homunculus joint exam.  Investigation: No additional findings.  Imaging: ECHO TEE  Result Date: 07/05/2022    TRANSESOPHOGEAL ECHO REPORT   Patient Name:   HAREEM FATHEREE Date of Exam: 07/05/2022 Medical Rec #:  [id number]      Height:       62.0 in Accession #:    [id number]     Weight:       145.1 lb Date of Birth:  [birth year]       BSA:          1.668 m Patient Age:    65 years       BP:           164/72 mmHg Patient Gender: F              HR:           90 bpm. Exam Location:  Inpatient Procedure: Transesophageal Echo, Color Doppler, Cardiac Doppler and Saline            Contrast Bubble Study Indications:     Stroke i63.9  History:         Patient has prior history of Echocardiogram examinations, most                  recent 05/07/2022. Risk Factors:Hypertension and Dyslipidemia.  Sonographer:     Raquel Sarna Senior RDCS Referring Phys:  Shirlee More, J Diagnosing Phys: Cherlynn Kaiser MD PROCEDURE: After discussion of the risks and  benefits of a TEE, an informed consent was obtained from the patient. TEE procedure time was 16 minutes. The transesophogeal probe was passed without difficulty through the esophogus of the patient. Imaged were obtained with the patient in a left lateral decubitus position. Local oropharyngeal anesthetic was provided with Cetacaine. Sedation performed by different physician. The patient was monitored while under deep sedation. Anesthestetic sedation was provided intravenously by Anesthesiology: 335mg  of Propofol. Image quality was excellent. The patient's vital signs; including heart rate, blood pressure, and oxygen saturation; remained stable throughout the procedure. The patient developed no complications during the procedure. IMPRESSIONS  1. Left ventricular ejection fraction, by estimation, is 65 to 70%. The left ventricle has normal function. The left ventricle has no regional wall motion abnormalities.  2. Right ventricular systolic function is normal. The right ventricular size is normal.  3. Left atrial size was mildly dilated. No left atrial/left atrial appendage thrombus was detected. The LAA emptying velocity was 95 cm/s.  4. A small pericardial effusion is present.  5. The mitral valve is normal in structure. Trivial mitral valve regurgitation. No evidence of mitral stenosis.  6. The aortic valve is tricuspid. There is mild calcification of the aortic valve. Aortic valve regurgitation is trivial. Mild aortic valve stenosis. Aortic valve mean gradient measures 12.0 mmHg.  7. There is mild (Grade II) atheroma plaque involving the aortic arch and descending aorta.  8. Evidence of atrial level shunting detected by color flow Doppler. Agitated saline contrast bubble study was positive with shunting observed within 3-6 cardiac cycles suggestive of interatrial shunt. There is a small patent foramen ovale with bidirectional shunting across atrial septum. FINDINGS  Left Ventricle: Left ventricular ejection  fraction, by estimation, is 65 to 70%. The left ventricle has normal function. The left ventricle has no regional wall motion abnormalities. The left ventricular internal cavity size was normal in size. Right Ventricle: The right ventricular size is normal.  No increase in right ventricular wall thickness. Right ventricular systolic function is normal. Left Atrium: Left atrial size was mildly dilated. No left atrial/left atrial appendage thrombus was detected. The LAA emptying velocity was 95 cm/s. Right Atrium: Right atrial size was normal in size. Pericardium: A small pericardial effusion is present. Mitral Valve: The mitral valve is normal in structure. Trivial mitral valve regurgitation. No evidence of mitral valve stenosis. Tricuspid Valve: The tricuspid valve is normal in structure. Tricuspid valve regurgitation is trivial. No evidence of tricuspid stenosis. Aortic Valve: The aortic valve is tricuspid. There is mild calcification of the aortic valve. Aortic valve regurgitation is trivial. Mild aortic stenosis is present. Aortic valve mean gradient measures 12.0 mmHg. Aortic valve peak gradient measures 17.2 mmHg. Aortic valve area, by VTI measures 1.83 cm. Pulmonic Valve: The pulmonic valve was normal in structure. Pulmonic valve regurgitation is trivial. No evidence of pulmonic stenosis. Aorta: The aortic root and ascending aorta are structurally normal, with no evidence of dilitation. There is mild (Grade II) atheroma plaque involving the aortic arch and descending aorta. IAS/Shunts: There is redundancy of the interatrial septum. Evidence of atrial level shunting detected by color flow Doppler. Agitated saline contrast was given intravenously to evaluate for intracardiac shunting. Agitated saline contrast bubble study was  positive with shunting observed within 3-6 cardiac cycles suggestive of interatrial shunt. A small patent foramen ovale is detected with bidirectional shunting across atrial septum.  LEFT  VENTRICLE PLAX 2D LVOT diam:     2.00 cm LV SV:         78 LV SV Index:   47 LVOT Area:     3.14 cm  AORTIC VALVE AV Area (Vmax):    1.60 cm AV Area (Vmean):   1.70 cm AV Area (VTI):     1.83 cm AV Vmax:           207.50 cm/s AV Vmean:          156.000 cm/s AV VTI:            0.423 m AV Peak Grad:      17.2 mmHg AV Mean Grad:      12.0 mmHg LVOT Vmax:         106.00 cm/s LVOT Vmean:        84.600 cm/s LVOT VTI:          0.247 m LVOT/AV VTI ratio: 0.58  AORTA Ao Root diam: 3.00 cm Ao Asc diam:  3.00 cm  SHUNTS Systemic VTI:  0.25 m Systemic Diam: 2.00 cm Cherlynn Kaiser MD Electronically signed by Cherlynn Kaiser MD Signature Date/Time: 07/05/2022/1:28:03 PM    Final    CUP PACEART REMOTE DEVICE CHECK  Result Date: 07/03/2022 ILR summary report received. Battery status OK. Normal device function. No new tachy, brady, or pause episodes. No new AF episodes. Monthly summary reports and ROV/PRN 2 symptom activation's no associated arrhythmia LA  MR THORACIC SPINE WO CONTRAST  Result Date: 06/24/2022 [facility] [address], [place],  [address] [phone] NEUROIMAGING REPORT STUDY DATE: 06/24/2022 PATIENT NAME: KRIYA JAN DOB: 1956/03/24 MRN: [id number] EXAM: MRI of the thoracic spine ORDERING CLINICIAN: Richard A. Sater, MD. PhD CLINICAL HISTORY: 66 year old woman with white matter changes in the brain, gait disturbance and left leg weakness. COMPARISON FILMS: None TECHNIQUE: MRI of the thoracic spine was obtained utilizing 3 mm sagittal slices from the posterior fossa from C7 to L1 level with T1, T2 and inversion recovery views. In  addition 4 mm axial slices from X9B7 to J69C7 level were included with T2 and gradient echo views. CONTRAST: None IMAGING SITE: [facility], [place], [place], [place] FINDINGS: :  On scout images, the spine is imaged from above the cervicomedullary junction to the lower thoracic spine.  The dedicated thoracic spine images run from  C7-L1.  The spinal cord is of normal caliber and signal.   The vertebral bodies are normally aligned.    There is a small hemangioma within the T1 vertebral body.  Otherwise, the vertebral bodies have normal signal.  The discs and interspaces were further evaluated on axial views from C7 to L1..  There were no disc bulges or protrusions.  No significant spondylosis.  Neural foramina are widely patent.  No spinal stenosis or nerve root compression.   This is a normal MRI of the thoracic spine. INTERPRETING PHYSICIAN: Richard A. Felecia Shelling, MD, PhD, FAAN Certified in  Neuroimaging by [place] [employer]  MR [place]  Result Date: 06/24/2022  [facility] [address], [place], [place] [address] [phone] NEUROIMAGING REPORT STUDY DATE: 06/24/2022 PATIENT NAME: NOELIA LENART DOB: [birth year] MRN: [id number] EXAM: MRI of the cervical spine ORDERING CLINICIAN: Richard A. Sater, MD. PhD CLINICAL HISTORY: 67 year old woman with white matter abnormality of the brain and ataxic gait with left leg weakness COMPARISON FILMS: None TECHNIQUE: MRI of the cervical spine was obtained utilizing 3 mm sagittal slices from the posterior fossa down to the T3-4 level with T1, T2 and inversion recovery views. In addition 4 mm axial slices from T6-1 down to T1-2 level were included with T2 and gradient echo views. CONTRAST: None IMAGING SITE: [facility], [place], [place], [place] FINDINGS: :  On sagittal images, the spine is imaged from above the cervicomedullary junction to T2.  The visible brain appears normal.  The cervicomedullary junction appears normal.  Paravertebral soft tissues appears normal.  The spinal cord is of normal caliber and signal.   The vertebral bodies are normally aligned.   The vertebral bodies have normal signal.  The discs and interspaces were further evaluated on axial views from C2 to T1 as follows: C2-C3: The disc and interspace appear  normal. C3-C4: The disc and interspace appear normal. C4-C5: The disc and interspace appear normal. C5-C6: There is mild disc bulging and minimal right uncovertebral spurring causing minimal right foraminal narrowing but no spinal stenosis or nerve root compression. C6-C7: There is mild central disc bulging causing minimal right foraminal narrowing but no spinal stenosis or nerve root compression.  C7-T1: The disc and interspace appear normal.   This MRI of the cervical spine shows the following: The spinal cord appears normal. Minimal disc degenerative changes as detailed above, normal for age and not leading to nerve root compression or spinal stenosis. INTERPRETING PHYSICIAN: Richard A. Felecia Shelling, MD, PhD, FAAN Certified in  Neuroimaging by [employer]    Recent Labs: Lab Results  Component Value Date   WBC 3.9 07/02/2022   HGB 12.2 07/02/2022   PLT 373 07/02/2022   NA 137 07/02/2022   K 3.8 07/02/2022   CL 99 07/02/2022   CO2 22 07/02/2022   GLUCOSE 94 07/02/2022   BUN 15 07/02/2022   CREATININE 1.09 (H) 07/02/2022   BILITOT 0.5 06/22/2022   ALKPHOS 68 05/07/2022   AST 25 06/22/2022   ALT 21 06/22/2022   PROT 7.1 06/22/2022   PROT 7.1 06/22/2022   ALBUMIN 3.9  05/07/2022   CALCIUM 9.5 07/02/2022   GFRAA >90 07/07/2012   QFTBGOLDPLUS NEGATIVE 06/22/2022   September 2023 SPEP normal, immunoglobulins normal, TB Gold negative, hepatitis B negative, hepatitis C negative, CK 39, ANA 1:40 NS, ENA (double-stranded DNA, SSA, SSB, Menz, RNP, SCL 70) negative, C3-C4 normal, lupus anticoagulant negative, anticardiolipin IgG> 112, beta-2 GP 1 IgG> 112.  Speciality Comments: Ocular toxicity from Plaquenil use MTX 6 tablets p.o. weekly caused muscle pain  Procedures:  No procedures performed Allergies: Plaquenil [hydroxychloroquine], Sulfa antibiotics, and Penicillins   Assessment / Plan:     Visit Diagnoses: Lupus ([place]) - dxd at age 25.H/o +ANA, fatigue and myalgias.  PLQ  discontinued in 2023 due to ocular toxicity. She has sicca symptoms, fatigue and recent history of stroke -patient is gradually recovering.  She still has visual disturbances and word finding difficulty.  She has some difficulty walking.  She has not back to work yet.  She states she will have to extend her short-term disability.  To complete the work-up I will obtain urine protein creatinine ratio today.  Plan: Protein / creatinine ratio, urine, Ambulatory referral to Nephrology.  I had a detailed discussion with the patient regarding different treatment options.  She has never taken Imuran in the past.  Patient states that she confused Imuran with methotrexate at the last visit.  I plan is to restart her on Imuran 50 mg p.o. daily and keep her on the same dose if the TPMT is normal.  We will check labs in a month and then every 3 months to monitor for drug toxicity.  Indications side effects contraindications were discussed.  Informed consent was obtained.  Baseline Immunosuppressant Therapy Labs TB GOLD    Latest Ref Rng & Units 06/22/2022   11:27 AM  Quantiferon TB Gold  Quantiferon TB Gold Plus NEGATIVE NEGATIVE    Hepatitis Panel    Latest Ref Rng & Units 06/22/2022   11:27 AM  Hepatitis  Hep B Surface Ag NON-REACTIVE NON-REACTIVE   Hep B IgM NON-REACTIVE NON-REACTIVE   Hep C Ab NON-REACTIVE NON-REACTIVE    HIV Lab Results  Component Value Date   HIV Non Reactive 05/08/2022   Immunoglobulins    Latest Ref Rng & Units 06/22/2022   11:27 AM  Immunoglobulin Electrophoresis  IgA  70 - 320 mg/dL 243   IgG 600 - 1,540 mg/dL 1,113   IgM 50 - 300 mg/dL 56    SPEP    Latest Ref Rng & Units 06/22/2022   11:27 AM  Serum Protein Electrophoresis  Total Protein 6.1 - 8.1 g/dL 6.1 - 8.1 g/dL 7.1    7.1   Albumin 3.8 - 4.8 g/dL 4.3   Alpha-1 0.2 - 0.3 g/dL 0.3   Alpha-2 0.5 - 0.9 g/dL 0.7   Beta Globulin 0.4 - 0.6 g/dL 0.4   Beta 2 0.2 - 0.5 g/dL 0.3   Gamma Globulin 0.8 - 1.7 g/dL 1.0      Patient was counseled on the purpose, proper use, and adverse effects of azathioprine including risk of infection, nausea, rash, and hair loss.  Also informed that medication can cause discoloration of urine, sweat and tears. Reviewed risk of cancer after long term use.  Discussed risk of myelosupression and reviewed importance of frequent lab work to monitor blood counts.  Standing orders placed. Reviewed drug-drug interactions including contraindication with allopurinol.  Provided patient with educational materials on azathioprine and answered all questions.  Patient consented to azathioprine.  Will upload consent  into the media tab.   Patient dose will be 50 mg p.o. daily.  Prescription will be sent to pharmacy pending lab results and insurance approval.   High risk medication use - Plaquenil from age 66 till 2023, discontinued due to ocular toxicity.  MTX for 2 months and discontinued due to muscle pain. - Plan: Thiopurine methyltransferase(tpmt)rbc.  Information about immunization was placed in the AVS.  She was advised to stop Imuran if she develops an infection and resume after the infection resolves.  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  Left findings were discussed with the patient at length.  I will repeat antibody titers in 3 months.  Cerebrovascular accident (CVA) due to thrombosis of right middle cerebral artery [facility]) - May 07, 2022 with left facial droop and dysarthria.  She is on Plavix 75 mg p.o. daily and aspirin 81 mg p.o. daily.  She is followed by Dr. Felecia Shelling.  Degeneration of lumbar intervertebral disc - History of intermittent chronic lower back pain.  She continues to have intermittent lower back pain.  Trochanteric bursitis, right hip -improved.  She had prednisone taper by the neurologist in the past and was given an injection at the last visit.  Chronic pain  of both knees - No warmth swelling or effusion was noted at the last visit.  Myalgia -she continues to have some generalized muscle pain.  CK normal.  Elevated serum creatinine-her creatinine has been mildly elevated.  Patient is concerned because she has history of lupus.  I will refer her to nephrologist.  Primary hypertension-blood pressure was elevated.  She was advised to monitor blood pressure closely and follow-up with her PCP.  Other medical problems are listed as follows:  Nonrheumatic mitral valve regurgitation  Hyperlipidemia LDL goal <100  Peptic ulcer disease  History of gastroesophageal reflux (GERD)  History of IBS  Gastroparesis  Stage 3a chronic kidney disease ([place]) - Plan: Ambulatory referral to Nephrology  Trigeminal neuralgia  Anemia, chronic disease  History of osteopenia  GAD (generalized anxiety disorder)  Recurrent major depressive disorder, in partial remission ([place])  Orders: Orders Placed This Encounter  Procedures   Protein / creatinine ratio, urine   Thiopurine methyltransferase(tpmt)rbc   Ambulatory referral to Nephrology   No orders of the defined types were placed in this encounter.    Follow-Up Instructions: Return in about 6 weeks (around 08/31/2022) for SLE, anticardiolipin syndrome.   Bo Merino, MD  Note - This record has been created using Editor, commissioning.  Chart creation errors have been sought, but may not always  have been located. Such creation errors do not reflect on  the standard of medical care.

## 2022-07-11 NOTE — Telephone Encounter (Signed)
Page 3 of 8 was dropped off  Pt has been informed that the entire packet would need to be viewed prior to completion.   She expressed understanding and will bring the rest.

## 2022-07-17 ENCOUNTER — Telehealth: Payer: Self-pay | Admitting: *Deleted

## 2022-07-17 ENCOUNTER — Telehealth: Payer: Self-pay | Admitting: Neurology

## 2022-07-17 ENCOUNTER — Ambulatory Visit (INDEPENDENT_AMBULATORY_CARE_PROVIDER_SITE_OTHER): Payer: Medicare Other | Admitting: Neurology

## 2022-07-17 DIAGNOSIS — G4710 Hypersomnia, unspecified: Secondary | ICD-10-CM | POA: Diagnosis not present

## 2022-07-17 DIAGNOSIS — G4719 Other hypersomnia: Secondary | ICD-10-CM

## 2022-07-17 DIAGNOSIS — R0683 Snoring: Secondary | ICD-10-CM

## 2022-07-17 NOTE — Progress Notes (Signed)
Carelink Summary Report / Loop Recorder 

## 2022-07-17 NOTE — Telephone Encounter (Signed)
R/c cd from [facility]. Pt cd in nurse pod.

## 2022-07-17 NOTE — Telephone Encounter (Signed)
I personally reviewed the MRI of the lumbar spine from 06/04/2022.  At L2-L3, there is minimal retrolisthesis and disc bulging.  There is moderate right facet hypertrophy.  No spinal stenosis.  Mild left foraminal narrowing but no nerve root compression.  At L3-L4, there is minimal anterolisthesis associated with severe facet hypertrophy and other degenerative change.  There is moderately severe left and mild right foraminal narrowing with potential for left L3 nerve root compression.    L4-L5: There is minimal anterolisthesis and right greater than left facet hypertrophy and other degenerative change causing moderately severe right foraminal narrowing with potential for right L4 nerve root compression.  No spinal stenosis.

## 2022-07-17 NOTE — Telephone Encounter (Signed)
PT visits today with all forms. They are currently in Dr.Jones' mailbox in a [place] envelope with PT's name and information.  CB if needed: [phone]

## 2022-07-18 ENCOUNTER — Encounter: Payer: Self-pay | Admitting: Cardiovascular Disease

## 2022-07-18 ENCOUNTER — Ambulatory Visit: Payer: Medicare Other | Attending: Cardiovascular Disease | Admitting: Cardiovascular Disease

## 2022-07-18 VITALS — BP 112/62 | HR 59 | Ht 62.0 in | Wt 147.0 lb

## 2022-07-18 DIAGNOSIS — I6350 Cerebral infarction due to unspecified occlusion or stenosis of unspecified cerebral artery: Secondary | ICD-10-CM

## 2022-07-18 DIAGNOSIS — Q2112 Patent foramen ovale: Secondary | ICD-10-CM | POA: Diagnosis present

## 2022-07-18 NOTE — Patient Instructions (Signed)
Medication Instructions:  Your physician recommends that you continue on your current medications as directed. Please refer to the Current Medication list given to you today.  *If you need a refill on your cardiac medications before your next appointment, please call your pharmacy*   Lab Work: NONE If you have labs (blood work) drawn today and your tests are completely normal, you will receive your results only by: [place] (if you have MyChart) OR A paper copy in the mail If you have any lab test that is abnormal or we need to change your treatment, we will call you to review the results.   Testing/Procedures: NONE   Follow-Up: At [facility], you and your health needs are our priority.  As part of our continuing mission to provide you with exceptional heart care, we have created designated Provider Care Teams.  These Care Teams include your primary Cardiologist (physician) and Advanced Practice Providers (APPs -  Physician Assistants and Nurse Practitioners) who all work together to provide you with the care you need, when you need it.  We recommend signing up for the patient portal called "MyChart".  Sign up information is provided on this After Visit Summary.  MyChart is used to connect with patients for Virtual Visits (Telemedicine).  Patients are able to view lab/test results, encounter notes, upcoming appointments, etc.  Non-urgent messages can be sent to your provider as well.   To learn more about what you can do with MyChart, go to [email or web address].    Your next appointment:   As Needed w/Structural Team  The format for your next appointment:   In Person  Provider:   Sherren Mocha, MD      Important Information About Sugar

## 2022-07-18 NOTE — Telephone Encounter (Signed)
Forms given to PCP to review and sign.

## 2022-07-18 NOTE — Progress Notes (Signed)
Cardiology Office Note:    Date:  07/18/2022   ID:  Debbie Hatfield, DOB May 28, 1956, MRN [id number]  PCP:  Etta Grandchild, MD   [facility] Providers Cardiologist:  None Electrophysiologist:  Will Jorja Loa, MD     Referring MD: Etta Grandchild, MD   Chief Complaint  Patient presents with   pfo    History of Present Illness:    Debbie Hatfield is a 66 y.o. female who presents for evaluation of PFO.  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  She was in her normal state of health until she presented with acute onset of left face numbness, left facial droop, and a left sensory deficit.  A code stroke was called and she was found to have a small acute infarct in the right posterior frontal white matter.  She was treated with IV tPA and was discharged home the following day after undergoing inpatient stroke evaluation.  A 2D echocardiogram in the hospital showed normal LV and RV function with no significant valvular disease.  She later underwent a transesophageal echo which demonstrated a small PFO with no atrial septal aneurysm.  There were no other significant abnormalities noted.  She is referred for discussion of PFO management.  The patient has no history of previous stroke.  Hypercoagulable labs were significant for an elevated anticardiolipin antibody IgG of greater than 112.  Her ANA is positive with a titer of 1: 40.  She is followed by Dr. Corliss Skains and reportedly will be started on Imuran in the near future.  She is currently taking aspirin and clopidogrel.  She feels like she is recovering fairly well from her stroke.  She denies any chest pain, shortness of breath, or heart palpitations.  Past Medical History:  Diagnosis Date   Closed fracture of right distal radius    Gastroparesis    GERD (gastroesophageal reflux disease)    Hypertension    Irritable bowel disease    Lupus (HCC)    Major depression    Stroke [facility])    Trigeminal  neuralgia     Past Surgical History:  Procedure Laterality Date   BUBBLE STUDY  07/05/2022   Procedure: BUBBLE STUDY;  Surgeon: Parke Poisson, MD;  Location: [facility];  Service: Cardiology;;   CHOLECYSTECTOMY     COLONOSCOPY     LOOP RECORDER IMPLANT  2021   ORIF WRIST FRACTURE Right 09/22/2020   Procedure: OPEN REDUCTION INTERNAL FIXATION (ORIF) WRIST FRACTURE;  Surgeon: Ernest Mallick, MD;  Location: [facility];  Service: Orthopedics;  Laterality: Right;    TEE WITHOUT CARDIOVERSION N/A 07/05/2022   Procedure: TRANSESOPHAGEAL ECHOCARDIOGRAM (TEE);  Surgeon: Parke Poisson, MD;  Location: [facility] END[facility];  Service: Cardiology;  Laterality: N/A;    Current Medications: Current Meds  Medication Sig   acetaminophen (TYLENOL) 325 MG tablet Take 650 mg by mouth 2 (two) times daily.   acyclovir (ZOVIRAX) 400 MG tablet Take 1 tablet (400 mg total) by mouth daily.   amLODipine (NORVASC) 5 MG tablet Take 1 tablet (5 mg total) by mouth daily.   aspirin EC 81 MG tablet Take 1 tablet (81 mg total) by mouth daily. Swallow whole. (Patient taking differently: Take 81 mg by mouth at bedtime. Swallow whole.)   baclofen (LIORESAL) 10 MG tablet Take 1 tablet (10 mg total) by mouth 3 (three) times daily. (Patient taking differently: Take 10 mg by mouth  as needed.)   carbamazepine (CARBATROL) 300 MG 12 hr capsule TAKE 1 CAPSULE(300 MG) BY MOUTH DAILY (Patient taking differently: Take 300 mg by mouth at bedtime.)   clopidogrel (PLAVIX) 75 MG tablet Take 1 tablet (75 mg total) by mouth daily.   gabapentin (NEURONTIN) 300 MG capsule Take 300 mg by mouth 3 (three) times daily.   hydrochlorothiazide (MICROZIDE) 12.5 MG capsule Take 1 capsule (12.5 mg total) by mouth daily.   hydrOXYzine (ATARAX) 10 MG tablet Take 1 tablet (10 mg total) by mouth every 8 (eight) hours as needed. (Patient taking differently: Take 10 mg by mouth every 8 (eight) hours as needed for anxiety.)    meclizine (ANTIVERT) 25 MG tablet Take 25 mg by mouth daily as needed for dizziness.   pantoprazole (PROTONIX) 40 MG tablet Take 1 tablet (40 mg total) by mouth daily.   rosuvastatin (CRESTOR) 20 MG tablet Take 1 tablet (20 mg total) by mouth daily.   telmisartan-hydrochlorothiazide (MICARDIS HCT) 40-12.5 MG tablet TAKE 1 TABLET BY MOUTH DAILY   tizanidine (ZANAFLEX) 2 MG capsule Take 4 mg by mouth at bedtime.   traMADol (ULTRAM) 50 MG tablet Take 50 mg by mouth 2 (two) times daily.   venlafaxine XR (EFFEXOR-XR) 37.5 MG 24 hr capsule TAKE 2 CAPSULES(75 MG) BY MOUTH DAILY WITH BREAKFAST (Patient taking differently: Take 75 mg by mouth at bedtime.)     Allergies:   Plaquenil [hydroxychloroquine], Sulfa antibiotics, and Penicillins   Social History   Socioeconomic History   Marital status: Widowed    Spouse name: Not on file   Number of children: 0   Years of education: Not on file   Highest education level: Bachelor's degree (e.g., BA, AB, BS)  Occupational History    Comment: RN ortho/neuro at [employer]  Tobacco Use   Smoking status: Never    Passive exposure: Never   Smokeless tobacco: Never  Vaping Use   Vaping Use: Never used  Substance and Sexual Activity   Alcohol use: Yes    Comment: social   Drug use: Never   Sexual activity: Not Currently    Birth control/protection: Post-menopausal  Other Topics Concern   Not on file  Social History Narrative   06/13/22 living with her mother   Social Determinants of Health   Financial Resource Strain: Not on file  Food Insecurity: Not on file  Transportation Needs: Not on file  Physical Activity: Not on file  Stress: Not on file  Social Connections: Not on file     Family History: The patient's family history includes Alcoholism in her father; Colon cancer in her maternal grandfather; Congestive Heart Failure in her maternal grandfather and maternal grandmother; Depression in her mother; Diabetes in her maternal grandmother; Gout  in her mother; Heart block in her mother; Hypertension in her mother and paternal grandfather; Macular degeneration in her mother; Stroke in her paternal grandfather.  ROS:   Please see the history of present illness.    All other systems reviewed and are negative.  EKGs/Labs/Other Studies Reviewed:    The following studies were reviewed today: TEE/echo studies reviewed as above  EKG:  EKG is not ordered today.  The ekg from 05/15/2022 shows normal sinus rhythm with nonspecific T wave flattening.  Recent Labs: 11/15/2021: TSH 1.45 06/22/2022: ALT 21 07/02/2022: BUN 15; Creatinine, Ser 1.09; Hemoglobin 12.2; Platelets 373; Potassium 3.8; Sodium 137  Recent Lipid Panel    Component Value Date/Time   CHOL 238 (H) 05/08/2022 0424   TRIG  119 05/08/2022 0424   HDL 109 05/08/2022 0424   CHOLHDL 2.2 05/08/2022 0424   VLDL 24 05/08/2022 0424   LDLCALC 105 (H) 05/08/2022 0424     Risk Assessment/Calculations:                Physical Exam:    VS:  BP 112/62   Pulse (!) 59   Ht 5\' 2"  (1.575 m)   Wt 147 lb (66.7 kg)   SpO2 99%   BMI 26.89 kg/m     Wt Readings from Last 3 Encounters:  07/18/22 147 lb (66.7 kg)  07/05/22 145 lb 1 oz (65.8 kg)  06/25/22 145 lb 1.3 oz (65.8 kg)     GEN:  Well nourished, well developed in no acute distress HEENT: Normal NECK: No JVD; No carotid bruits LYMPHATICS: No lymphadenopathy CARDIAC: RRR, no murmurs, rubs, gallops RESPIRATORY:  Clear to auscultation without rales, wheezing or rhonchi  ABDOMEN: Soft, non-tender, non-distended MUSCULOSKELETAL:  No edema; No deformity  SKIN: Warm and dry NEUROLOGIC:  Alert and oriented x 3 PSYCHIATRIC:  Normal affect   ASSESSMENT:    No diagnosis found. PLAN:    In order of problems listed above:  The patient's clinical history, neuroimaging, and cardiac imaging is all reviewed.  I specifically reviewed her transesophageal echo images which show a small PFO with no evidence of atrial septal aneurysm.   We discussed the potential relationship between PFO and cryptogenic stroke today.  We discussed both anatomic considerations of recurrent stroke risk as well as patient characteristics.  From a PFO anatomy standpoint, the patient's PFO would be considered "low risk" because it is small and has no atrial septal aneurysm associated with it.  Considering patient factors, with age greater than 6, presence of hypertension and connective tissue disease, she is less likely to have suffered a PFO-related stroke especially considering her stroke type is small vessel related.  It seems appropriate to continue her on medical therapy and I have not recommended moving forward with transcatheter PFO closure at this time.  The patient is provided the opportunity to ask questions and participate in a shared decision-making conversation.  She is in agreement with medical therapy and we would reserve PFO closure for a recurrent event if all other risk factors are well treated.           Medication Adjustments/Labs and Tests Ordered: Current medicines are reviewed at length with the patient today.  Concerns regarding medicines are outlined above.  No orders of the defined types were placed in this encounter.  No orders of the defined types were placed in this encounter.   Patient Instructions  Medication Instructions:  Your physician recommends that you continue on your current medications as directed. Please refer to the Current Medication list given to you today.  *If you need a refill on your cardiac medications before your next appointment, please call your pharmacy*   Lab Work: NONE If you have labs (blood work) drawn today and your tests are completely normal, you will receive your results only by: MyChart Message (if you have MyChart) OR A paper copy in the mail If you have any lab test that is abnormal or we need to change your treatment, we will call you to review the  results.   Testing/Procedures: NONE   Follow-Up: At [facility], you and your health needs are our priority.  As part of our continuing mission to provide you with exceptional heart care, we have created designated  Provider Care Teams.  These Care Teams include your primary Cardiologist (physician) and Advanced Practice Providers (APPs -  Physician Assistants and Nurse Practitioners) who all work together to provide you with the care you need, when you need it.  We recommend signing up for the patient portal called "MyChart".  Sign up information is provided on this After Visit Summary.  MyChart is used to connect with patients for Virtual Visits (Telemedicine).  Patients are able to view lab/test results, encounter notes, upcoming appointments, etc.  Non-urgent messages can be sent to your provider as well.   To learn more about what you can do with MyChart, go to [email or web address].    Your next appointment:   As Needed w/Structural Team  The format for your next appointment:   In Person  Provider:   Sherren Mocha, MD      Important Information About Sugar         Signed, Sherren Mocha, MD  07/18/2022 10:32 AM    [place]

## 2022-07-19 NOTE — Progress Notes (Signed)
   [facility]  HOME SLEEP STUDY  STUDY DATE: 07/17/2022 PATIENT NAME: Debbie Hatfield DOB: [birth year] MRN: [id number]  ORDERING CLINICIAN: Richard A. Felecia Shelling, MD, PhD REFERRING CLINICIAN: Richard A. Sater, MD. PhD   CLINICAL INFORMATION: 66 year old woman with snoring, EDS and history of stroke   IMPRESSION:  There is no significant OSA (pAHI 3% = 4.6) No significant nocturnal hypoxemia (less than 1 minute less than 88%)   RECOMMENDATION: The study did not show OSA and therefore CPAP is not indicated.  If the snoring is troublesome, weight loss or an oral appliance may be helpful. Follow-up with Dr. Felecia Shelling.   INTERPRETING PHYSICIAN:   Richard A. Felecia Shelling, MD, PhD, [facility] Certified in Neurology, Clinical Neurophysiology, Sleep Medicine, Pain Medicine and Neuroimaging  [facility] [address], [place], [place] [address] [phone]

## 2022-07-20 ENCOUNTER — Encounter: Payer: Self-pay | Admitting: Rheumatology

## 2022-07-20 ENCOUNTER — Ambulatory Visit: Payer: Medicare Other | Attending: Rheumatology | Admitting: Rheumatology

## 2022-07-20 ENCOUNTER — Other Ambulatory Visit: Payer: Self-pay

## 2022-07-20 VITALS — BP 145/82 | HR 82 | Resp 15 | Ht 62.0 in | Wt 147.2 lb

## 2022-07-20 DIAGNOSIS — M5136 Other intervertebral disc degeneration, lumbar region: Secondary | ICD-10-CM | POA: Diagnosis present

## 2022-07-20 DIAGNOSIS — M791 Myalgia, unspecified site: Secondary | ICD-10-CM

## 2022-07-20 DIAGNOSIS — R7989 Other specified abnormal findings of blood chemistry: Secondary | ICD-10-CM | POA: Diagnosis present

## 2022-07-20 DIAGNOSIS — F3341 Major depressive disorder, recurrent, in partial remission: Secondary | ICD-10-CM | POA: Diagnosis present

## 2022-07-20 DIAGNOSIS — T372X5A Adverse effect of antimalarials and drugs acting on other blood protozoa, initial encounter: Secondary | ICD-10-CM | POA: Insufficient documentation

## 2022-07-20 DIAGNOSIS — Z79899 Other long term (current) drug therapy: Secondary | ICD-10-CM

## 2022-07-20 DIAGNOSIS — G8929 Other chronic pain: Secondary | ICD-10-CM

## 2022-07-20 DIAGNOSIS — M51369 Other intervertebral disc degeneration, lumbar region without mention of lumbar back pain or lower extremity pain: Secondary | ICD-10-CM

## 2022-07-20 DIAGNOSIS — Z8739 Personal history of other diseases of the musculoskeletal system and connective tissue: Secondary | ICD-10-CM

## 2022-07-20 DIAGNOSIS — K3184 Gastroparesis: Secondary | ICD-10-CM

## 2022-07-20 DIAGNOSIS — M7061 Trochanteric bursitis, right hip: Secondary | ICD-10-CM

## 2022-07-20 DIAGNOSIS — F411 Generalized anxiety disorder: Secondary | ICD-10-CM

## 2022-07-20 DIAGNOSIS — D638 Anemia in other chronic diseases classified elsewhere: Secondary | ICD-10-CM | POA: Diagnosis present

## 2022-07-20 DIAGNOSIS — IMO0002 Reserved for concepts with insufficient information to code with codable children: Secondary | ICD-10-CM

## 2022-07-20 DIAGNOSIS — E785 Hyperlipidemia, unspecified: Secondary | ICD-10-CM

## 2022-07-20 DIAGNOSIS — M25562 Pain in left knee: Secondary | ICD-10-CM

## 2022-07-20 DIAGNOSIS — N1831 Chronic kidney disease, stage 3a: Secondary | ICD-10-CM | POA: Diagnosis present

## 2022-07-20 DIAGNOSIS — I10 Essential (primary) hypertension: Secondary | ICD-10-CM

## 2022-07-20 DIAGNOSIS — H35389 Toxic maculopathy, unspecified eye: Secondary | ICD-10-CM

## 2022-07-20 DIAGNOSIS — K279 Peptic ulcer, site unspecified, unspecified as acute or chronic, without hemorrhage or perforation: Secondary | ICD-10-CM

## 2022-07-20 DIAGNOSIS — M329 Systemic lupus erythematosus, unspecified: Secondary | ICD-10-CM

## 2022-07-20 DIAGNOSIS — I340 Nonrheumatic mitral (valve) insufficiency: Secondary | ICD-10-CM | POA: Diagnosis present

## 2022-07-20 DIAGNOSIS — M25561 Pain in right knee: Secondary | ICD-10-CM | POA: Diagnosis present

## 2022-07-20 DIAGNOSIS — G500 Trigeminal neuralgia: Secondary | ICD-10-CM | POA: Diagnosis present

## 2022-07-20 DIAGNOSIS — I63311 Cerebral infarction due to thrombosis of right middle cerebral artery: Secondary | ICD-10-CM

## 2022-07-20 DIAGNOSIS — R760 Raised antibody titer: Secondary | ICD-10-CM | POA: Diagnosis not present

## 2022-07-20 DIAGNOSIS — I6350 Cerebral infarction due to unspecified occlusion or stenosis of unspecified cerebral artery: Secondary | ICD-10-CM

## 2022-07-20 DIAGNOSIS — Z8719 Personal history of other diseases of the digestive system: Secondary | ICD-10-CM | POA: Diagnosis present

## 2022-07-20 NOTE — Telephone Encounter (Signed)
Pending lab results, patient will be starting imuran per Dr. Estanislado Pandy. Thanks!   Consent obtained and sent to the scan center.

## 2022-07-20 NOTE — Patient Instructions (Addendum)
Azathioprine Tablets What is this medication? AZATHIOPRINE (ay za THYE oh preen) prevents the body from rejecting an organ transplant. It works by lowering the body's immune system response. This helps the body accept the donor organ. It may also be used to treat rheumatoid arthritis. This medicine may be used for other purposes; ask your health care provider or pharmacist if you have questions. COMMON BRAND NAME(S): Azasan, Imuran What should I tell my care team before I take this medication? They need to know if you have any of these conditions: Infection Kidney disease Liver disease An unusual or allergic reaction to azathioprine, lactose, other medications, foods, dyes, or preservatives Pregnant or trying to get pregnant Breastfeeding How should I use this medication? Take this medication by mouth with a full glass of water. Take it as directed on the prescription label at the same time every day. Keep taking it unless your care team tells you to stop. Keep taking it even if you think you are better. Talk to your care team about the use of this medication in children. Special care may be needed. Overdosage: If you think you have taken too much of this medicine contact a poison control center or emergency room at once. NOTE: This medicine is only for you. Do not share this medicine with others. What if I miss a dose? If you miss a dose, take it as soon as you can. If it is almost time for your next dose, take only that dose. Do not take double or extra doses. What may interact with this medication? Do not take this medication with any of the following: Febuxostat Mercaptopurine This medication may also interact with the following: Allopurinol Aminosalicylates, such as sulfasalazine, mesalamine, balsalazide, and olsalazine Leflunomide Medications called ACE inhibitors, such as benazepril, captopril, enalapril, fosinopril, quinapril, lisinopril, ramipril, and  trandolapril Mycophenolate Sulfamethoxazole; trimethoprim Vaccines Warfarin This list may not describe all possible interactions. Give your health care provider a list of all the medicines, herbs, non-prescription drugs, or dietary supplements you use. Also tell them if you smoke, drink alcohol, or use illegal drugs. Some items may interact with your medicine. What should I watch for while using this medication? Visit your care team for regular checks on your progress. You may need blood work done while you are taking this medication. This medication may increase your risk of getting an infection. Call your care team for advice if you get a fever, chills, sore throat, or other symptoms of a cold or flu. Do not treat yourself. Try to avoid being around people who are sick. Talk to your care team about your risk of cancer. You may be more at risk for certain types of cancer if you take this medication. Talk to your care team if you may be pregnant. This medication can cause serious birth defects if taken during pregnancy. This medication may cause infertility. Talk to your care team if you are concerned about your fertility. What side effects may I notice from receiving this medication? Side effects that you should report to your care team as soon as possible: Allergic reactions--skin rash, itching, hives, swelling of the face, lips, tongue, or throat Change in your skin, such as a new growth, a sore that doesn't heal, or a change in a mole Dizziness, loss of balance or coordination, confusion or trouble speaking Infection--fever, chills, cough, sore throat, wounds that don't heal, pain or trouble when passing urine, general feeling of discomfort or being unwell Low red blood cell level--unusual  weakness or fatigue, dizziness, headache, trouble breathing Unusual bruising or bleeding Side effects that usually do not require medical attention (report to your care team if they continue or are  bothersome): Diarrhea Fatigue Nausea Vomiting This list may not describe all possible side effects. Call your doctor for medical advice about side effects. You may report side effects to FDA at 1-800-FDA-1088. Where should I keep my medication? Keep out of the reach of children and pets. Store at room temperature between 15 and 25 degrees C (59 and 77 degrees F). Protect from light. Get rid of any unused medication after the expiration date. To get rid of medications that are no longer needed or have expired: Take the medication to a medication take-back program. Check with your pharmacy or law enforcement to find a location. If you cannot return the medication, check the label or package insert to see if the medication should be thrown out in the garbage or flushed down the toilet. If you are not sure, ask your care team. If it is safe to put it in the trash, empty the medication out of the container. Mix the medication with cat litter, dirt, coffee grounds, or other unwanted substance. Seal the mixture in a bag or container. Put it in the trash. NOTE: This sheet is a summary. It may not cover all possible information. If you have questions about this medicine, talk to your doctor, pharmacist, or health care provider.  2023 Elsevier/Gold Standard (2022-02-23 00:00:00)  Standing Labs We placed an order today for your standing lab work.   Please have your standing labs drawn in 1 month after starting Imuran and then every 3 months  Please have your labs drawn 2 weeks prior to your appointment so that the provider can discuss your lab results at your appointment.  Please note that you may see your imaging and lab results in [place] before we have reviewed them. We will contact you once all results are reviewed. Please allow our office up to 72 hours to thoroughly review all of the results before contacting the office for clarification of your results.  Lab hours are: Monday through Thursday  from 1:30 pm-4:30 pm and Friday from 1:30 pm- 4:00 pm  You may experience shorter wait times on Monday, Thursday or Friday afternoons,.   Effective August 13, 2022, new lab hours will be: Monday through Thursday from 8:00 am -12:30 pm and 1:00 pm-5:00 pm and Friday from 8:00 am-12:00 pm.  Please be advised, all patients with office appointments requiring lab work will take precedent over walk-in lab work.   Labs are drawn by [facility]. Please bring your co-pay at the time of your lab draw.  You may receive a bill from [place] for your lab work.  Please note if you are on Hydroxychloroquine and and an order has been placed for a Hydroxychloroquine level, you will need to have it drawn 4 hours or more after your last dose.  If you wish to have your labs drawn at another location, please call the office 24 hours in advance so we can fax the orders.  The office is located at [address] [place], [place], [place] [address] No appointment is necessary.    If you have any questions regarding directions or hours of operation,  please call [phone].   As a reminder, please drink plenty of water prior to coming for your lab work. Thanks!   Vaccines You are taking a medication(s) that can suppress your immune system.  The following immunizations are recommended: Flu annually Covid-19  Td/Tdap (tetanus, diphtheria, pertussis) every 10 years Pneumonia (Prevnar 15 then Pneumovax 23 at least 1 year apart.  Alternatively, can take Prevnar 20 without needing additional dose) Shingrix: 2 doses from 4 weeks to 6 months apart  Please check with your PCP to make sure you are up to date.   If you have signs or symptoms of an infection or start antibiotics: First, call your PCP for workup of your infection. Hold your medication through the infection, until you complete your antibiotics, and until symptoms resolve if you take the following: Injectable medication (Actemra, Benlysta, Cimzia, Cosentyx,  Enbrel, Humira, Kevzara, Orencia, Remicade, Simponi, Stelara, Taltz, Tremfya) Methotrexate Leflunomide (Arava) Mycophenolate (Cellcept) Harriette Ohara, Olumiant, or Rinvoq

## 2022-07-23 ENCOUNTER — Other Ambulatory Visit: Payer: Self-pay | Admitting: Internal Medicine

## 2022-07-23 ENCOUNTER — Encounter: Payer: Self-pay | Admitting: Rheumatology

## 2022-07-23 DIAGNOSIS — I10 Essential (primary) hypertension: Secondary | ICD-10-CM

## 2022-07-24 DIAGNOSIS — Z0289 Encounter for other administrative examinations: Secondary | ICD-10-CM

## 2022-07-25 ENCOUNTER — Ambulatory Visit: Payer: Medicare Other | Admitting: Internal Medicine

## 2022-07-25 ENCOUNTER — Encounter: Payer: Self-pay | Admitting: Internal Medicine

## 2022-07-25 NOTE — Telephone Encounter (Signed)
Pt has been informed that forms were completed and ready for pick up at the front desk.   Original given to pt Copy sent to scan Copy filed with [place]

## 2022-07-25 NOTE — Telephone Encounter (Signed)
Forms almost done.   Waiting for response from pt in regard to her limitations.   See MyChart dated 07/25/22.

## 2022-07-26 ENCOUNTER — Encounter: Payer: Self-pay | Admitting: Rheumatology

## 2022-07-28 ENCOUNTER — Encounter: Payer: Self-pay | Admitting: Neurology

## 2022-07-29 LAB — THIOPURINE METHYLTRANSFERASE (TPMT), RBC: Thiopurine Methyltransferase, RBC: 15 nmol/hr/mL RBC

## 2022-07-29 LAB — PROTEIN / CREATININE RATIO, URINE
Creatinine, Urine: 132 mg/dL (ref 20–275)
Protein/Creat Ratio: 76 mg/g creat (ref 24–184)
Protein/Creatinine Ratio: 0.076 mg/mg creat (ref 0.024–0.184)
Total Protein, Urine: 10 mg/dL (ref 5–24)

## 2022-07-30 MED ORDER — AZATHIOPRINE 50 MG PO TABS: 50.0000 mg | ORAL_TABLET | Freq: Every day | ORAL | 0 refills | Status: DC

## 2022-07-30 NOTE — Progress Notes (Signed)
Urine protein creatinine ratio is normal.  Please send a prescription for Imuran 50 mg p.o. daily.

## 2022-07-30 NOTE — Progress Notes (Signed)
Patient will need labs in 2 weeks, 2 months and then every 3 months which will include CBC with differential and CMP with GFR.  She should notify us if she develops any side effects on Imuran.

## 2022-07-30 NOTE — Telephone Encounter (Signed)
Please send a prescription for Imuran 50 mg p.o. daily.

## 2022-08-01 ENCOUNTER — Encounter: Payer: Self-pay | Admitting: Neurology

## 2022-08-02 ENCOUNTER — Encounter: Payer: Self-pay | Admitting: Neurology

## 2022-08-02 ENCOUNTER — Encounter: Payer: Self-pay | Admitting: Internal Medicine

## 2022-08-03 ENCOUNTER — Encounter: Payer: Self-pay | Admitting: Rheumatology

## 2022-08-03 MED ORDER — PREDNISONE 5 MG PO TABS: ORAL_TABLET | ORAL | 0 refills | Status: DC

## 2022-08-03 NOTE — Telephone Encounter (Signed)
Please send a prednisone taper starting at 20 mg and taper by 5 mg every 4 days.

## 2022-08-05 ENCOUNTER — Encounter: Payer: Self-pay | Admitting: Rheumatology

## 2022-08-06 ENCOUNTER — Ambulatory Visit (INDEPENDENT_AMBULATORY_CARE_PROVIDER_SITE_OTHER): Payer: Medicare Other

## 2022-08-06 DIAGNOSIS — R55 Syncope and collapse: Secondary | ICD-10-CM

## 2022-08-06 NOTE — Progress Notes (Signed)
Office Visit Note  Patient: Debbie Hatfield             Date of Birth: 1956-01-21           MRN: [id number]             PCP: Janith Lima, MD Referring: Janith Lima, MD Visit Date: 08/07/2022 Occupation: @GUAROCC @  Subjective:  Discuss medication options   History of Present Illness: Debbie Hatfield is a 66 y.o. female with history of systemic lupus and DDD.  Patient was started on Imuran after her last office visit on 07/20/2022.  She took her first dose of Imuran on Saturday night and had uncontrollable shaking on Sunday.  On Monday she experienced significant muscle soreness and fatigue.  Patient reports that she only took 1 dose of Imuran and has discontinued it and does not plan to restart.  She states that she had similar side effects when taking low-dose methotrexate and discontinued.  She previously had ocular toxicity after long-term use of Plaquenil.  She presents today to discuss other treatment options.  She is unsure if the possible risks will outweigh the benefits. She continues to take Plavix as prescribed by Dr. Rachel Moulds.  She is also taking a baby aspirin.  She has an upcoming appointment with Dr. Felecia Shelling tomorrow.  She continues to have difficulty with recall, word finding, and difficulty talking at times.  At times it feels as though her tongue is tied.  She continues to use a cane to assist with ambulation.   She denies any joint swelling at this time.  Denies a history of inflammatory arthritis.  She has intermittent symptoms of Raynaud's with cold exposure.  Denies any oral or nasal ulcerations.  Denies any recent rashes.    Activities of Daily Living:  Patient reports morning stiffness for all day. Patient Reports nocturnal pain.  Difficulty dressing/grooming: Reports Difficulty climbing stairs: Reports Difficulty getting out of chair: Reports Difficulty using hands for taps, buttons, cutlery, and/or writing: Denies  Review of Systems  Constitutional:  Positive for  fatigue.  HENT:  Positive for mouth dryness. Negative for mouth sores.   Eyes:  Positive for dryness.  Respiratory:  Negative for shortness of breath.   Cardiovascular:  Negative for chest pain and palpitations.  Gastrointestinal:  Negative for blood in stool, constipation and diarrhea.  Endocrine: Negative for increased urination.  Genitourinary:  Negative for involuntary urination.  Musculoskeletal:  Positive for joint pain, gait problem, joint pain, myalgias, muscle weakness, morning stiffness, muscle tenderness and myalgias. Negative for joint swelling.  Skin:  Negative for color change, rash, hair loss and sensitivity to sunlight.  Allergic/Immunologic: Negative for susceptible to infections.  Neurological:  Positive for dizziness and headaches.  Hematological:  Negative for swollen glands.  Psychiatric/Behavioral:  Positive for sleep disturbance. Negative for depressed mood. The patient is not nervous/anxious.     PMFS History:  Patient Active Problem List   Diagnosis Date Noted   Nonrheumatic mitral valve regurgitation 05/10/2022   Stroke (cerebrum) ([place]) 05/07/2022   Anemia, chronic disease 05/06/2022   Hyperlipidemia LDL goal <100 03/26/2022   Recurrent major depressive disorder, in partial remission ([place]) 11/15/2021   Need for shingles vaccine 11/15/2021   Visit for screening mammogram 11/15/2021   Need for vaccination 11/15/2021   GAD (generalized anxiety disorder) 11/15/2021   Stage 3a chronic kidney disease ([place]) 11/15/2021   Trigeminal neuralgia    Lupus ([place])    Irritable bowel disease  GERD (gastroesophageal reflux disease)    Hypertension    Gastroparesis    Degeneration of lumbar intervertebral disc 01/26/2020   Osteopenia 12/14/2015   Peptic ulcer disease 12/14/2015    Past Medical History:  Diagnosis Date   Closed fracture of right distal radius    Gastroparesis    GERD (gastroesophageal reflux disease)    Hypertension    Irritable bowel disease     Lupus ([place])    Major depression    Stroke ([place])    Trigeminal neuralgia     Family History  Problem Relation Age of Onset   Heart block Mother        Pacemaker   Hypertension Mother    Depression Mother    Gout Mother    Macular degeneration Mother    Alcoholism Father    Congestive Heart Failure Maternal Grandmother    Diabetes Maternal Grandmother    Congestive Heart Failure Maternal Grandfather    Colon cancer Maternal Grandfather    Stroke Paternal Grandfather    Hypertension Paternal Grandfather    Past Surgical History:  Procedure Laterality Date   BUBBLE STUDY  07/05/2022   Procedure: BUBBLE STUDY;  Surgeon: Elouise Munroe, MD;  Location: [facility] ENDOSCOPY;  Service: Cardiology;;   CHOLECYSTECTOMY     COLONOSCOPY     LOOP RECORDER IMPLANT  2021   ORIF WRIST FRACTURE Right 09/22/2020   Procedure: OPEN REDUCTION INTERNAL FIXATION (ORIF) WRIST FRACTURE;  Surgeon: Verner Mould, MD;  Location: [facility];  Service: Orthopedics;  Laterality: Right;  62mn   TEE WITHOUT CARDIOVERSION N/A 07/05/2022   Procedure: TRANSESOPHAGEAL ECHOCARDIOGRAM (TEE);  Surgeon: AElouise Munroe MD;  [place] [facility]  Service: Cardiology;  Laterality: N/A;   Social History   Social History Narrative   06/13/22 living with her mother   Immunization History  Administered Date(s) Administered   Influenza,inj,Quad PF,6-35 Mos 06/17/2019   Influenza-Unspecified 09/02/2020, 07/27/2021   Moderna SARS-COV2 Booster Vaccination 09/02/2020   Moderna Sars-Covid-2 Vaccination 10/23/2019, 11/20/2019   PNEUMOCOCCAL CONJUGATE-20 11/15/2021   Tdap 03/28/2014, 12/29/2020     Objective: Vital Signs: BP (!) 155/82 (BP Location: Left Arm, Patient Position: Sitting, Cuff Size: Normal)   Pulse 61   Resp 16   Ht 5' 2"  (1.575 m)   Wt 149 lb 6.4 oz (67.8 kg)   BMI 27.33 kg/m    Physical Exam Vitals and nursing note reviewed.  Constitutional:      Appearance: She is  well-developed.  HENT:     Head: Normocephalic and atraumatic.  Eyes:     Conjunctiva/sclera: Conjunctivae normal.  Cardiovascular:     Rate and Rhythm: Normal rate and regular rhythm.     Heart sounds: Murmur heard.  Pulmonary:     Effort: Pulmonary effort is normal.     Breath sounds: Normal breath sounds.  Abdominal:     General: Bowel sounds are normal.     Palpations: Abdomen is soft.  Musculoskeletal:     Cervical back: Normal range of motion.  Skin:    General: Skin is warm and dry.     Capillary Refill: Capillary refill takes less than 2 seconds.  Neurological:     Mental Status: She is alert and oriented to person, place, and time.  Psychiatric:        Behavior: Behavior normal.      Musculoskeletal Exam: C-spine has good range of motion with crepitus.  Painful range of motion of lumbar spine.  Shoulder  joints have good range of motion with no discomfort.  Tenderness over both elbow joints but no warmth or synovitis noted.  Wrist joints, MCPs, PIPs, DIPs have good range of motion with no synovitis.  Complete fist formation bilaterally.  Hip joints have limited range of motion bilaterally.  Knee joints have good range of motion with no warmth or effusion.  Ankle joints have good range of motion with no tenderness or joint swelling.  CDAI Exam: CDAI Score: -- Patient Global: --; Provider Global: -- Swollen: --; Tender: -- Joint Exam 08/07/2022   No joint exam has been documented for this visit   There is currently no information documented on the homunculus. Go to the Rheumatology activity and complete the homunculus joint exam.  Investigation: No additional findings.  Imaging: No results found.  Recent Labs: Lab Results  Component Value Date   WBC 3.9 07/02/2022   HGB 12.2 07/02/2022   PLT 373 07/02/2022   NA 137 07/02/2022   K 3.8 07/02/2022   CL 99 07/02/2022   CO2 22 07/02/2022   GLUCOSE 94 07/02/2022   BUN 15 07/02/2022   CREATININE 1.09 (H)  07/02/2022   BILITOT 0.5 06/22/2022   ALKPHOS 68 05/07/2022   AST 25 06/22/2022   ALT 21 06/22/2022   PROT 7.1 06/22/2022   PROT 7.1 06/22/2022   ALBUMIN 3.9 05/07/2022   CALCIUM 9.5 07/02/2022   GFRAA >90 07/07/2012   QFTBGOLDPLUS NEGATIVE 06/22/2022    Speciality Comments: Ocular toxicity from Plaquenil use MTX 6 tablets p.o. weekly caused muscle pain  Procedures:  No procedures performed Allergies: Plaquenil [hydroxychloroquine], Sulfa antibiotics, and Penicillins     Protein creatinine ratio WNL on 07/20/22.  Assessment / Plan:     Visit Diagnoses: Lupus ([place]) - dxd at age 46.H/o +ANA, fatigue and myalgias.  PLQ discontinued in 2023 due to ocular toxicity. She has sicca symptoms, fatigue and recent history of stroke:  Lab work from 06/22/2022 was reviewed today in the office: ESR within normal limits, RF negative, anti-CCP negative, SCL 70 negative, RNP negative, Fredin antibody negative, Ro antibody negative,  La-, double-stranded DNA negative, complements within normal limits, lupus anticoagulant not detected, ANA 1:40 NS, anticardiolipin IgG >112 and beta-2 glycoprotein IgG >112.  Labs were not consistent with an active systemic lupus flare but the significance of anticardiolipin IgG and beta-2 glycoprotein IgG positivity were previously discussed.  She is taking aspirin 81 mg daily and Plavix as prescribed by Dr. Felecia Shelling.  The plan was for the patient to initiate Imuran after her last office visit on 07/20/2022, but she was only able to take 1 dose of Imuran on Saturday 08/04/22 but developed uncontrollable shaking and discontinued.  She is currently experiencing myalgias and fatigue.  She presents today to discuss the risks and benefits of trying another medication going forward.  She previously could not tolerate methotrexate due to similar side effects including muscle cramping and uncontrollable shaking.  She previously discontinued Plaquenil due to ocular toxicity. Dr. Estanislado Pandy  discussed medications with the patient today in the office in detail.  Apprehensive to proceed with CellCept due to the risk for immunosuppression.  Discussed trying low-dose arava in the future if she develops any signs of active systemic lupus.  We will hold off on adding any systemic immunosuppressive agents at this time.  She will follow-up in the office in 2 months or sooner if needed.  - Plan: CBC with Differential/Platelet, COMPLETE METABOLIC PANEL WITH GFR, Anti-DNA antibody, double-stranded, C3 and C4,  Sedimentation rate  High risk medication use - Not currently taking any immunosuppressive agents. Discontinued imuran after 1 dose on 08/04/22.  TPMT WNL on 07/20/22.  Previous therapy: MTX-muscle cramps, plaquenil-ocular toxicity (use from age 34 until 2023), Imuran-d/c due to uncontrollable shaking after 1 dose.  Future order for CBC and CMP placed today.  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 Under care of Dr. Felecia Shelling.  Taking aspirin 81 mg daily and plavix as prescribed.  She has an upcoming appointment tomorrow with Dr. Felecia Shelling.    Cerebrovascular accident (CVA) due to thrombosis of right middle cerebral artery [facility]) - May 07, 2022 with left facial droop and dysarthria.  She is on Plavix 75 mg p.o. daily and aspirin 81 mg p.o. daily.  She is followed by Dr. Felecia Shelling.  Degeneration of lumbar intervertebral disc: Chronic pain.   Trochanteric bursitis, right SUO:[id number] discomfort.   Chronic pain of both knees: No warmth or effusion noted on examination.   Myalgia: CK WNL on 06/22/22. She continues to experience intermittent myalgias and muscle tenderness.  She is having increased muscle soreness after taking 1 dose of imuran.   Elevated serum  creatinine: Creatinine was 1.09 and GFR was 56 on 07/02/2022. Dr. Bettina Gavia reduced diuretic to take as needed.   Primary hypertension: Blood pressure was 155/82 today in the office.  Other medical conditions are listed as follows:  History of gastroesophageal reflux (GERD)  Nonrheumatic mitral valve regurgitation  Peptic ulcer disease  Hyperlipidemia LDL goal <100  Gastroparesis  History of IBS  Trigeminal neuralgia  Stage 3a chronic kidney disease (H[facility])  History of osteopenia  Anemia, chronic disease  GAD (generalized anxiety disorder)  Recurrent major depressive disorder, in partial remission ([place])  Orders: Orders Placed This Encounter  Procedures   CBC with Differential/Platelet   COMPLETE METABOLIC PANEL WITH GFR   Anti-DNA antibody, double-stranded   C3 and C4   Sedimentation rate   No orders of the defined types were placed in this encounter.     Follow-Up Instructions: Return in 2 months (on 10/07/2022) for Systemic lupus erythematosus, DDD.   Ofilia Neas, PA-C  Note - This record has been created using Dragon software.  Chart creation errors have been sought, but may not always  have been located. Such creation errors do not reflect on  the standard of medical care.

## 2022-08-07 ENCOUNTER — Encounter: Payer: Self-pay | Admitting: Physician Assistant

## 2022-08-07 ENCOUNTER — Ambulatory Visit: Payer: Medicare Other | Attending: Physician Assistant | Admitting: Physician Assistant

## 2022-08-07 VITALS — BP 155/82 | HR 61 | Resp 16 | Ht 62.0 in | Wt 149.4 lb

## 2022-08-07 DIAGNOSIS — M329 Systemic lupus erythematosus, unspecified: Secondary | ICD-10-CM | POA: Diagnosis not present

## 2022-08-07 DIAGNOSIS — M791 Myalgia, unspecified site: Secondary | ICD-10-CM | POA: Insufficient documentation

## 2022-08-07 DIAGNOSIS — N1831 Chronic kidney disease, stage 3a: Secondary | ICD-10-CM | POA: Diagnosis present

## 2022-08-07 DIAGNOSIS — G8929 Other chronic pain: Secondary | ICD-10-CM | POA: Insufficient documentation

## 2022-08-07 DIAGNOSIS — F411 Generalized anxiety disorder: Secondary | ICD-10-CM | POA: Diagnosis present

## 2022-08-07 DIAGNOSIS — G500 Trigeminal neuralgia: Secondary | ICD-10-CM | POA: Diagnosis present

## 2022-08-07 DIAGNOSIS — D638 Anemia in other chronic diseases classified elsewhere: Secondary | ICD-10-CM | POA: Diagnosis present

## 2022-08-07 DIAGNOSIS — F3341 Major depressive disorder, recurrent, in partial remission: Secondary | ICD-10-CM | POA: Diagnosis present

## 2022-08-07 DIAGNOSIS — M5136 Other intervertebral disc degeneration, lumbar region: Secondary | ICD-10-CM | POA: Diagnosis present

## 2022-08-07 DIAGNOSIS — K279 Peptic ulcer, site unspecified, unspecified as acute or chronic, without hemorrhage or perforation: Secondary | ICD-10-CM | POA: Insufficient documentation

## 2022-08-07 DIAGNOSIS — I340 Nonrheumatic mitral (valve) insufficiency: Secondary | ICD-10-CM | POA: Diagnosis present

## 2022-08-07 DIAGNOSIS — R760 Raised antibody titer: Secondary | ICD-10-CM | POA: Diagnosis not present

## 2022-08-07 DIAGNOSIS — Z8719 Personal history of other diseases of the digestive system: Secondary | ICD-10-CM | POA: Insufficient documentation

## 2022-08-07 DIAGNOSIS — K3184 Gastroparesis: Secondary | ICD-10-CM | POA: Insufficient documentation

## 2022-08-07 DIAGNOSIS — H35389 Toxic maculopathy, unspecified eye: Secondary | ICD-10-CM | POA: Insufficient documentation

## 2022-08-07 DIAGNOSIS — I10 Essential (primary) hypertension: Secondary | ICD-10-CM | POA: Diagnosis present

## 2022-08-07 DIAGNOSIS — Z79899 Other long term (current) drug therapy: Secondary | ICD-10-CM | POA: Diagnosis not present

## 2022-08-07 DIAGNOSIS — E785 Hyperlipidemia, unspecified: Secondary | ICD-10-CM | POA: Diagnosis present

## 2022-08-07 DIAGNOSIS — M25562 Pain in left knee: Secondary | ICD-10-CM | POA: Insufficient documentation

## 2022-08-07 DIAGNOSIS — T372X5A Adverse effect of antimalarials and drugs acting on other blood protozoa, initial encounter: Secondary | ICD-10-CM | POA: Diagnosis present

## 2022-08-07 DIAGNOSIS — I63311 Cerebral infarction due to thrombosis of right middle cerebral artery: Secondary | ICD-10-CM | POA: Insufficient documentation

## 2022-08-07 DIAGNOSIS — M7061 Trochanteric bursitis, right hip: Secondary | ICD-10-CM | POA: Diagnosis present

## 2022-08-07 DIAGNOSIS — M25561 Pain in right knee: Secondary | ICD-10-CM | POA: Diagnosis present

## 2022-08-07 DIAGNOSIS — Z8739 Personal history of other diseases of the musculoskeletal system and connective tissue: Secondary | ICD-10-CM | POA: Insufficient documentation

## 2022-08-07 DIAGNOSIS — R7989 Other specified abnormal findings of blood chemistry: Secondary | ICD-10-CM | POA: Insufficient documentation

## 2022-08-07 NOTE — Patient Instructions (Signed)
Leflunomide Tablets What is this medication? LEFLUNOMIDE (le FLOO na mide) treats the symptoms of rheumatoid arthritis. It works by slowing down an overactive immune system. This decreases inflammation. It belongs to a group of medications called DMARDs. This medicine may be used for other purposes; ask your health care provider or pharmacist if you have questions. COMMON BRAND NAME(S): Arava What should I tell my care team before I take this medication? They need to know if you have any of these conditions: Cancer Diabetes High blood pressure Immune system problems Infection Kidney disease Liver disease Low blood cell levels (white cells, red cells, and platelets) Lung or breathing disease, such as asthma or COPD Recent or upcoming vaccine Skin conditions Tingling of the fingers or toes, or other nerve disorder An unusual or allergic reaction to leflunomide, other medications, food, dyes, or preservatives Pregnant or trying to get pregnant Breastfeeding How should I use this medication? Take this medication by mouth with a full glass of water. Take it as directed on the prescription label at the same time every day. Keep taking it unless your care team tells you to stop. Talk to your care team about the use of this medication in children. Special care may be needed. Overdosage: If you think you have taken too much of this medicine contact a poison control center or emergency room at once. NOTE: This medicine is only for you. Do not share this medicine with others. What if I miss a dose? If you miss a dose, take it as soon as you can. If it is almost time for your next dose, take only that dose. Do not take double or extra doses. What may interact with this medication? Do not take this medication with any of the following: Teriflunomide This medication may also interact with the following: Alosetron Caffeine Cefaclor Certain medications for diabetes, such as nateglinide,  repaglinide, rosiglitazone, pioglitazone Certain medications for high cholesterol, such as atorvastatin, pravastatin, rosuvastatin, simvastatin Charcoal Cholestyramine Ciprofloxacin Duloxetine Estrogen and progestin hormones Furosemide Ketoprofen Live virus vaccines Medications that increase your risk for infection Methotrexate Mitoxantrone Paclitaxel Penicillin Theophylline Tizanidine Warfarin This list may not describe all possible interactions. Give your health care provider a list of all the medicines, herbs, non-prescription drugs, or dietary supplements you use. Also tell them if you smoke, drink alcohol, or use illegal drugs. Some items may interact with your medicine. What should I watch for while using this medication? Visit your care team for regular checks on your progress. Tell your care team if your symptoms do not start to get better or if they get worse. You may need blood work done while you are taking this medication. This medication may cause serious skin reactions. They can happen weeks to months after starting the medication. Contact your care team right away if you notice fevers or flu-like symptoms with a rash. The rash may be red or purple and then turn into blisters or peeling of the skin. You may also notice a red rash with swelling of the face, lips, or lymph nodes in your neck or under your arms. You should not receive certain vaccines during your treatment and for a certain time after your treatment with this medication ends. Talk to your care team for more information. This medication may stay in your body for up to 2 years after your last dose. Tell your care team about any unusual side effects or symptoms. A medication can be given to help lower your blood levels of this   medication more quickly. Talk to your care team if you may be pregnant. This medication can cause serious birth defects if taken during pregnancy and for a while after the last dose. You will  need a negative pregnancy test before starting this medication. Contraception is recommended while taking this medication and for a while after the last dose. Your care team can help you find the option that works for you. Do not breastfeed while taking this medication. What side effects may I notice from receiving this medication? Side effects that you should report to your care team as soon as possible: Allergic reactions--skin rash, itching, hives, swelling of the face, lips, tongue, or throat Dry cough, shortness of breath or trouble breathing Increase in blood pressure Infection--fever, chills, cough, sore throat, wounds that don't heal, pain or trouble when passing urine, general feeling of discomfort or being unwell Redness, blistering, peeling, or loosening of the skin, including inside the mouth Liver injury--right upper belly pain, loss of appetite, nausea, light-colored stool, dark yellow or brown urine, yellowing skin or eyes, unusual weakness or fatigue Pain, tingling, or numbness in the hands or feet Unusual bruising or bleeding Side effects that usually do not require medical attention (report to your care team if they continue or are bothersome): Back pain Diarrhea Hair loss Headache Nausea This list may not describe all possible side effects. Call your doctor for medical advice about side effects. You may report side effects to FDA at 1-800-FDA-1088. Where should I keep my medication? Keep out of the reach of children and pets. Store at room temperature between 20 and 25 degrees C (68 and 77 degrees F). Protect from moisture and light. Keep the container tightly closed. Get rid of any unused medication after the expiration date. To get rid of medications that are no longer needed or have expired: Take the medication to a medication take-back program. Check with your pharmacy or law enforcement to find a location. If you cannot return the medication, ask your pharmacist or  care team how to get rid of this medication safely. NOTE: This sheet is a summary. It may not cover all possible information. If you have questions about this medicine, talk to your doctor, pharmacist, or health care provider.  2023 Elsevier/Gold Standard (2021-10-02 00:00:00)

## 2022-08-08 ENCOUNTER — Other Ambulatory Visit: Payer: Self-pay

## 2022-08-08 ENCOUNTER — Encounter: Payer: Self-pay | Admitting: Neurology

## 2022-08-08 ENCOUNTER — Ambulatory Visit (INDEPENDENT_AMBULATORY_CARE_PROVIDER_SITE_OTHER): Payer: Medicare Other | Admitting: Neurology

## 2022-08-08 VITALS — BP 152/66 | HR 71 | Ht 62.0 in | Wt 148.0 lb

## 2022-08-08 DIAGNOSIS — R260 Ataxic gait: Secondary | ICD-10-CM

## 2022-08-08 DIAGNOSIS — I63311 Cerebral infarction due to thrombosis of right middle cerebral artery: Secondary | ICD-10-CM

## 2022-08-08 DIAGNOSIS — R29898 Other symptoms and signs involving the musculoskeletal system: Secondary | ICD-10-CM

## 2022-08-08 DIAGNOSIS — R4701 Aphasia: Secondary | ICD-10-CM | POA: Diagnosis not present

## 2022-08-08 DIAGNOSIS — I6350 Cerebral infarction due to unspecified occlusion or stenosis of unspecified cerebral artery: Secondary | ICD-10-CM

## 2022-08-08 DIAGNOSIS — G500 Trigeminal neuralgia: Secondary | ICD-10-CM

## 2022-08-08 LAB — CUP PACEART REMOTE DEVICE CHECK
Date Time Interrogation Session: 20231022231607
Implantable Pulse Generator Implant Date: 20220728

## 2022-08-08 NOTE — Patient Outreach (Signed)
Telephone outreach to patient to obtain mRS was successfully completed. MRS=1  Debbie Hatfield [facility] Care Management Assistant [phone]  

## 2022-08-08 NOTE — Progress Notes (Signed)
[facility]  PATIENT: Debbie Hatfield DOB: 1956-02-07  REFERRING DOCTOR OR PCP: [name] Linger, MD (PCP); Elmer Picker, NP (referring provider) SOURCE: Patient, notes from hospital admission, imaging and lab reports, MRI and CT images personally reviewed.  _________________________________   HISTORICAL  CHIEF COMPLAINT:  Chief Complaint  Patient presents with   Follow-up    Pt with mom, rm 1. Pt here for follow up from HST, MRI and discuss plan of care. She saw rheumatology yesterday and wanted to discuss that visit with MD. She is interested in completing another round of PT and cognitive therapy with ST. She still has pain in BLE     HISTORY OF PRESENT  She is noting more issues with her cognition and recall.  Sh has some expressive aphasia.   She notes reduced auditory processing if there are distracting noises (as in a store).  She would like to be referred for speech and neurocognitive therapy  She has phasic spasms of right leg that started after the stroke..  Tizanifine had not helped.   Addition of baclofen 5 -5 -10 mg has helped some.   These can be painful.       She has antiphospholipid antibodies and SLE (dx 30 years ago).   She saw rheumatology yesterday   She was placed on Imuran and had difficulty tolerating it.   She also had difficulty tolerating methotrexate.    Plaquenil caused eye issues.   Ranae Plumber was discussed at yesterday's visit with rheum  She is on aspirin and plavix DAPT for prophylaxis.   She also has a PFO and saw Dr. Excell Seltzer.  Closure has not been recommended.     She has had intense pain in the back and right leg.  She saw Dr. Ethelene Hal and had an MRI of the lumbar spine  She has taken some steroid with benefit.  She takes tramadol and gabapentin 600 tid and 4 mg tizanidine.     Also on CBZ for trigeminal neuralgia.     She snores and has excessive daytime sleepiness.  She often dozes off watching TV and falls asleep easily during the day.  A  home sleep study 07/17/2022 did not show any significant OSA (AHI 4.6).  History of stroke: She was working as an Charity fundraiser.  She was driving to work 7/82/9562 3 AM when she noted her left face was drooping and she felt speech unable to speak. At first she had difficulty with word finding and slurring.  She stuttered a little.     She went to [employer] where she worked and another Engineer, civil (consulting) also noted the facial droop and speech issues.  Her tongue felt thick and she felt numb on the left side.Marland Kitchen  She went to the ED at [facility].  She had a CT, MRi and tenecteplase (TNK) thrombolytic therapy quickly.   She saw Dr. Roda Shutters.   After TNK therapy, facial weakness and speech started to improve 8 hours later.   She is seeing speech therapy and is near baseline now..     She noted some patchy visual changes around the time of the stroke x 2 week.       She notes mild cognitive issues since the stroke but not before.     Other neurologic events:  She had bells palsy many years ago and has left Trigeminal neuralgia x 30 years.  She has noted left leg weakness x 10 years that is progressing.  She can walk  one hour but is slower than others.     The onset of the leg weakness was fairly sudden around 10 years ago.   Gait has slowly worsened.  She has 3 falls this year.  She has trouble getting up from the floor.     She has left anterolateral thigh numbness.   She has visual changes due to Plaquenil toxicity (for SLE vs undifferentiated CTD).     From Dr. Warren DanesXu's notes: HOSPITAL COURSE Stroke: Small acute infarct in the right posterior frontal lobe s/p TNK per wake up protocol, etiology likely small vessel disease  Code Stroke CT head No acute abnormality.  CTA head & neck No LVO CT perfusion No  core or penumbra identified MRI Small acute infarct in the right posterior frontal white matter. Moderate scattered T2/FLAIR hyperintensities the white matter, which are nonspecific but most likely due to chronic microvascular ischemic  disease given the patient's risk factors. Chronic demyelination is thought less likely. 2D Echo EF 60-65% LDL 105 HgbA1c 5.7 VTE prophylaxis - SCDs No antithrombotic prior to admission, now on aspirin 81 mg daily and clopidogrel 75 mg daily for 3 weeks and then ASA 81mg  alone Therapy recommendations:  Outpatient follow up Disposition:  Home      Imaging: MRI of the brain 05/07/2022 showed an acute stroke in the right frontoparietal region.  She has multiple T2/FLAIR hypertense foci.  Many are nonspecific but some are periventricular and radially oriented to the ventricles.  CT angiogram showed no large vessel occlusion or significant stenosis.  CT angiogram of the head and neck 05/07/2022 showed no emergent large vessel occlusion.  A thyroid nodule was noted.  MRI of the cervical and thoracic spine 06/24/2022 shows a normal spinal cord.  In the cervical spine there are mild degenerative changes at C5-C6 and C6-C7.  No spinal stenosis or nerve root compression.  MRI of the lumbar spine from 06/04/2022:   At L2-L3, there is minimal retrolisthesis and disc bulging.  There is moderate right facet hypertrophy.  No spinal stenosis.  Mild left foraminal narrowing but no nerve root compression.    At L3-L4, there is minimal anterolisthesis associated with severe facet hypertrophy and other degenerative change.  There is moderately severe left and mild right foraminal narrowing with potential for left L3 nerve root compression.    L4-L5: There is minimal anterolisthesis and right greater than left facet hypertrophy and other degenerative change causing moderately severe right foraminal narrowing with potential for right L4 nerve root compression.  No spinal stenosis.  REVIEW OF SYSTEMS: Constitutional: No fevers, chills, sweats, or change in appetite Eyes: No visual changes, double vision, eye pain Ear, nose and throat: No hearing loss, ear pain, nasal congestion, sore throat Cardiovascular: No chest pain,  palpitations Respiratory:  No shortness of breath at rest or with exertion.   No wheezes GastrointestinaI: No nausea, vomiting, diarrhea, abdominal pain, fecal incontinence Genitourinary:  No dysuria, urinary retention or frequency.  No nocturia. Musculoskeletal:  No neck pain, back pain Integumentary: No rash, pruritus, skin lesions Neurological: as above Psychiatric: No depression at this time.  No anxiety Endocrine: No palpitations, diaphoresis, change in appetite, change in weigh or increased thirst Hematologic/Lymphatic:  No anemia, purpura, petechiae. Allergic/Immunologic: No itchy/runny eyes, nasal congestion, recent allergic reactions, rashes  ALLERGIES: Allergies  Allergen Reactions   Plaquenil [Hydroxychloroquine]     Retinal damage   Sulfa Antibiotics Nausea And Vomiting   Penicillins Rash    HOME MEDICATIONS:  Current Outpatient Medications:  acetaminophen (TYLENOL) 325 MG tablet, Take 650 mg by mouth as needed., Disp: , Rfl:    acyclovir (ZOVIRAX) 400 MG tablet, Take 1 tablet (400 mg total) by mouth daily., Disp: 90 tablet, Rfl: 1   amLODipine (NORVASC) 5 MG tablet, Take 1 tablet (5 mg total) by mouth daily., Disp: 90 tablet, Rfl: 1   aspirin EC 81 MG tablet, Take 1 tablet (81 mg total) by mouth daily. Swallow whole. (Patient taking differently: Take 81 mg by mouth at bedtime. Swallow whole.), Disp: 30 tablet, Rfl: 12   azaTHIOprine (IMURAN) 50 MG tablet, Take 1 tablet (50 mg total) by mouth daily., Disp: 30 tablet, Rfl: 0   baclofen (LIORESAL) 10 MG tablet, Take 1 tablet (10 mg total) by mouth 3 (three) times daily. (Patient taking differently: Take 10 mg by mouth as needed.), Disp: 90 each, Rfl: 5   carbamazepine (CARBATROL) 300 MG 12 hr capsule, TAKE 1 CAPSULE(300 MG) BY MOUTH DAILY (Patient taking differently: Take 300 mg by mouth at bedtime.), Disp: 90 capsule, Rfl: 0   clopidogrel (PLAVIX) 75 MG tablet, Take 1 tablet (75 mg total) by mouth daily., Disp: 30 tablet,  Rfl: 11   gabapentin (NEURONTIN) 300 MG capsule, Take 300 mg by mouth 3 (three) times daily., Disp: , Rfl:    hydrochlorothiazide (MICROZIDE) 12.5 MG capsule, Take 1 capsule (12.5 mg total) by mouth daily., Disp: 30 capsule, Rfl: 11   hydrOXYzine (ATARAX) 10 MG tablet, Take 1 tablet (10 mg total) by mouth every 8 (eight) hours as needed. (Patient taking differently: Take 10 mg by mouth every 8 (eight) hours as needed for anxiety.), Disp: 270 tablet, Rfl: 0   meclizine (ANTIVERT) 25 MG tablet, Take 25 mg by mouth daily as needed for dizziness., Disp: , Rfl:    pantoprazole (PROTONIX) 40 MG tablet, Take 1 tablet (40 mg total) by mouth daily., Disp: 90 tablet, Rfl: 3   rosuvastatin (CRESTOR) 20 MG tablet, Take 1 tablet (20 mg total) by mouth daily., Disp: 90 tablet, Rfl: 1   telmisartan-hydrochlorothiazide (MICARDIS HCT) 40-12.5 MG tablet, TAKE 1 TABLET BY MOUTH DAILY, Disp: 90 tablet, Rfl: 0   tizanidine (ZANAFLEX) 2 MG capsule, Take 4 mg by mouth at bedtime., Disp: , Rfl:    traMADol (ULTRAM) 50 MG tablet, Take 50 mg by mouth 2 (two) times daily., Disp: , Rfl:    venlafaxine XR (EFFEXOR-XR) 37.5 MG 24 hr capsule, TAKE 2 CAPSULES(75 MG) BY MOUTH DAILY WITH BREAKFAST (Patient taking differently: Take 75 mg by mouth at bedtime.), Disp: 180 capsule, Rfl: 1  PAST MEDICAL HISTORY: Past Medical History:  Diagnosis Date   Closed fracture of right distal radius    Gastroparesis    GERD (gastroesophageal reflux disease)    Hypertension    Irritable bowel disease    Lupus ([place])    Major depression    Stroke ([place])    Trigeminal neuralgia     PAST SURGICAL HISTORY: Past Surgical History:  Procedure Laterality Date   BUBBLE STUDY  07/05/2022   Procedure: BUBBLE STUDY;  Surgeon: Elouise Munroe, MD;  Location: [facility];  Service: Cardiology;;   CHOLECYSTECTOMY     COLONOSCOPY     LOOP RECORDER IMPLANT  2021   ORIF WRIST FRACTURE Right 09/22/2020   Procedure: OPEN REDUCTION INTERNAL FIXATION  (ORIF) WRIST FRACTURE;  Surgeon: Verner Mould, MD;  Location: [facility];  Service: Orthopedics;  Laterality: Right;  66min   TEE WITHOUT CARDIOVERSION N/A 07/05/2022  Procedure: TRANSESOPHAGEAL ECHOCARDIOGRAM (TEE);  Surgeon: Parke Poisson, MD;  Location: [facility] ENDOSCOPY;  Service: Cardiology;  Laterality: N/A;    FAMILY HISTORY: Family History  Problem Relation Age of Onset   Heart block Mother        Pacemaker   Hypertension Mother    Depression Mother    Gout Mother    Macular degeneration Mother    Alcoholism Father    Congestive Heart Failure Maternal Grandmother    Diabetes Maternal Grandmother    Congestive Heart Failure Maternal Grandfather    Colon cancer Maternal Grandfather    Stroke Paternal Grandfather    Hypertension Paternal Grandfather     SOCIAL HISTORY:  Social History   Socioeconomic History   Marital status: Widowed    Spouse name: Not on file   Number of children: 0   Years of education: Not on file   Highest education level: Bachelor's degree (e.g., BA, AB, BS)  Occupational History    Comment: RN ortho/neuro at [employer]  Tobacco Use   Smoking status: Never    Passive exposure: Never   Smokeless tobacco: Never  Vaping Use   Vaping Use: Never used  Substance and Sexual Activity   Alcohol use: Yes    Comment: social   Drug use: Never   Sexual activity: Not Currently    Birth control/protection: Post-menopausal  Other Topics Concern   Not on file  Social History Narrative   06/13/22 living with her mother   Social Determinants of Health   Financial Resource Strain: Not on file  Food Insecurity: Not on file  Transportation Needs: Not on file  Physical Activity: Not on file  Stress: Not on file  Social Connections: Not on file  Intimate Partner Violence: Not on file     PHYSICAL EXAM  Vitals:   08/08/22 0934  BP: (!) 152/66  Pulse: 71  Weight: 148 lb (67.1 kg)  Height: 5\' 2"  (1.575 m)    Body mass index  is 27.07 kg/m.   General: The patient is well-developed and well-nourished and in no acute distress  HEENT:  Head is [place]/AT.  Sclera are anicteric.    Skin: Extremities are without rash or  edema.  Musculoskeletal:  Back is nontender  Neurologic Exam  Mental status: The patient is alert and oriented x 3 at the time of the examination. No obvious memory issue during visit.   Speech shows mild word finding difficulty.  She was able to name objects pointed to around the room.  Cranial nerves: Extraocular movements are full.    She has symmetric facial sensation .Facial strength is normal.  Trapezius and sternocleidomastoid strength is normal. No dysarthria is noted.  The tongue is midline, and the patient has symmetric elevation of the soft palate. No obvious hearing deficits are noted.  Motor:  Muscle bulk is normal.   Tone is normal. Strength is  5 / 5 in the arms and right leg.  Strength was 4+/5 in the left EHL..   Sensory: Sensory testing is intact to pinprick, soft touch and vibration sensation in the arms but she had reduced sensation to touch in the distribution of the left lateral femoral cutaneous nerve..  Coordination: Cerebellar testing reveals good finger-nose-finger and heel-to-shin bilaterally.  Gait and station: Station is normal.   Gait is near normal. Tandem gait is wide. Romberg is negative.   Reflexes: Deep tendon reflexes are symmetric and normal in the arms  3 inlegs with spread at knees but  no clonus at ankles.    DIAGNOSTIC DATA (LABS, IMAGING, TESTING) - I reviewed patient records, labs, notes, testing and imaging myself where available.  Lab Results  Component Value Date   WBC 3.9 07/02/2022   HGB 12.2 07/02/2022   HCT 36.5 07/02/2022   MCV 92 07/02/2022   PLT 373 07/02/2022      Component Value Date/Time   NA 137 07/02/2022 0812   K 3.8 07/02/2022 0812   CL 99 07/02/2022 0812   CO2 22 07/02/2022 0812   GLUCOSE 94 07/02/2022 0812   GLUCOSE 102 (H)  06/22/2022 1127   BUN 15 07/02/2022 0812   CREATININE 1.09 (H) 07/02/2022 0812   CREATININE 0.97 06/22/2022 1127   CALCIUM 9.5 07/02/2022 0812   PROT 7.1 06/22/2022 1127   PROT 7.1 06/22/2022 1127   ALBUMIN 3.9 05/07/2022 0905   AST 25 06/22/2022 1127   ALT 21 06/22/2022 1127   ALKPHOS 68 05/07/2022 0905   BILITOT 0.5 06/22/2022 1127   GFRNONAA >60 05/08/2022 0424   GFRAA >90 07/07/2012 0610   Lab Results  Component Value Date   CHOL 238 (H) 05/08/2022   HDL 109 05/08/2022   LDLCALC 105 (H) 05/08/2022   TRIG 119 05/08/2022   CHOLHDL 2.2 05/08/2022   Lab Results  Component Value Date   HGBA1C 5.7 (H) 05/08/2022   No results found for: "VITAMINB12" Lab Results  Component Value Date   TSH 1.45 11/15/2021       ASSESSMENT AND PLAN  Cerebrovascular accident (CVA) due to thrombosis of right middle cerebral artery (HCC) - Plan: SLP cognitive language evaluation  Aphasia  Left leg weakness  Ataxic gait  Trigeminal neuralgia  T2 foci are nonspecific.  No evidence of demyelination in spinal cord.   Likely ischemic given her history. For the stroke and antiphospholipid syndrome, continue Plavix and aspirin for now.   Change to just aspirin if any bleeding. OK for Arava if decided on by Rheumatology. For the spasticity stop tizanidine and try to get to baclofen 10 mg po tid.   Refer to speech and neurocognitve therapy.  She will return to see me in 2 months or sooner based on the results of the studies.  She should also call if new or worsening symptoms.   45-minute office visit with the majority of the time spent face-to-face for history and physical, discussion/counseling and decision-making.  Additional time with record review and documentation.   Anika Shore A. Epimenio Foot, MD, [facility] 08/08/2022, 1:38 PM Certified in Neurology, Clinical Neurophysiology, Sleep Medicine and Neuroimaging  [facility] [address] [place], [place] [address] [phone]

## 2022-08-10 ENCOUNTER — Telehealth: Payer: Self-pay | Admitting: Physician Assistant

## 2022-08-10 NOTE — Telephone Encounter (Signed)
-----   Message from Britt Bottom, MD sent at 08/08/2022  2:05 PM EDT ----- She is on mutual patient with antiphospholipid syndrome and stroke.  I saw on the last note that you were contemplating Rogers.    I would be fine with her starting that drug.

## 2022-08-13 ENCOUNTER — Ambulatory Visit: Payer: Medicare Other | Admitting: Internal Medicine

## 2022-08-15 ENCOUNTER — Other Ambulatory Visit: Payer: Self-pay | Admitting: Neurology

## 2022-08-15 DIAGNOSIS — I63311 Cerebral infarction due to thrombosis of right middle cerebral artery: Secondary | ICD-10-CM

## 2022-08-15 NOTE — Progress Notes (Unsigned)
[facility]  PATIENT: Debbie Hatfield DOB: December 21, 1955  REFERRING DOCTOR OR PCP: [name] Linger, MD (PCP); Elmer Picker, NP (referring provider) SOURCE: Patient, notes from hospital admission, imaging and lab reports, MRI and CT images personally reviewed.  _________________________________   HISTORICAL  CHIEF COMPLAINT:  No chief complaint on file.    HISTORY OF PRESENT  She is noting more issues with her cognition and recall.  Sh has some expressive aphasia.   She notes reduced auditory processing if there are distracting noises (as in a store).  She would like to be referred for speech and neurocognitive therapy  She has phasic spasms of right leg that started after the stroke..  Tizanifine had not helped.   Addition of baclofen 5 -5 -10 mg has helped some.   These can be painful.       She has antiphospholipid antibodies and SLE (dx 30 years ago).   She saw rheumatology yesterday   She was placed on Imuran and had difficulty tolerating it.   She also had difficulty tolerating methotrexate.    Plaquenil caused eye issues.   Ranae Plumber was discussed at yesterday's visit with rheum  She is on aspirin and plavix DAPT for prophylaxis.   She also has a PFO and saw Dr. Excell Seltzer.  Closure has not been recommended.     She has had intense pain in the back and right leg.  She saw Dr. Ethelene Hal and had an MRI of the lumbar spine  She has taken some steroid with benefit.  She takes tramadol and gabapentin 600 tid and 4 mg tizanidine.     Also on CBZ for trigeminal neuralgia.     She snores and has excessive daytime sleepiness.  She often dozes off watching TV and falls asleep easily during the day.  A home sleep study 07/17/2022 did not show any significant OSA (AHI 4.6).  History of stroke: She was working as an Charity fundraiser.  She was driving to work 9/60/4540 3 AM when she noted her left face was drooping and she felt speech unable to speak. At first she had difficulty with word finding and  slurring.  She stuttered a little.     She went to [employer] where she worked and another Engineer, civil (consulting) also noted the facial droop and speech issues.  Her tongue felt thick and she felt numb on the left side.Marland Kitchen  She went to the ED at [facility].  She had a CT, MRi and tenecteplase (TNK) thrombolytic therapy quickly.   She saw Dr. Roda Shutters.   After TNK therapy, facial weakness and speech started to improve 8 hours later.   She is seeing speech therapy and is near baseline now..     She noted some patchy visual changes around the time of the stroke x 2 week.       She notes mild cognitive issues since the stroke but not before.     Other neurologic events:  She had bells palsy many years ago and has left Trigeminal neuralgia x 30 years.  She has noted left leg weakness x 10 years that is progressing.  She can walk one hour but is slower than others.     The onset of the leg weakness was fairly sudden around 10 years ago.   Gait has slowly worsened.  She has 3 falls this year.  She has trouble getting up from the floor.     She has left anterolateral thigh numbness.  She has visual changes due to Plaquenil toxicity (for SLE vs undifferentiated CTD).     From Dr. Warren Danes notes: HOSPITAL COURSE Stroke: Small acute infarct in the right posterior frontal lobe s/p TNK per wake up protocol, etiology likely small vessel disease  Code Stroke CT head No acute abnormality.  CTA head & neck No LVO CT perfusion No  core or penumbra identified MRI Small acute infarct in the right posterior frontal white matter. Moderate scattered T2/FLAIR hyperintensities the white matter, which are nonspecific but most likely due to chronic microvascular ischemic disease given the patient's risk factors. Chronic demyelination is thought less likely. 2D Echo EF 60-65% LDL 105 HgbA1c 5.7 VTE prophylaxis - SCDs No antithrombotic prior to admission, now on aspirin 81 mg daily and clopidogrel 75 mg daily for 3 weeks and then ASA 81mg  alone Therapy  recommendations:  Outpatient follow up Disposition:  Home      Imaging: MRI of the brain 05/07/2022 showed an acute stroke in the right frontoparietal region.  She has multiple T2/FLAIR hypertense foci.  Many are nonspecific but some are periventricular and radially oriented to the ventricles.  CT angiogram showed no large vessel occlusion or significant stenosis.  CT angiogram of the head and neck 05/07/2022 showed no emergent large vessel occlusion.  A thyroid nodule was noted.  MRI of the cervical and thoracic spine 06/24/2022 shows a normal spinal cord.  In the cervical spine there are mild degenerative changes at C5-C6 and C6-C7.  No spinal stenosis or nerve root compression.  MRI of the lumbar spine from 06/04/2022:   At L2-L3, there is minimal retrolisthesis and disc bulging.  There is moderate right facet hypertrophy.  No spinal stenosis.  Mild left foraminal narrowing but no nerve root compression.    At L3-L4, there is minimal anterolisthesis associated with severe facet hypertrophy and other degenerative change.  There is moderately severe left and mild right foraminal narrowing with potential for left L3 nerve root compression.    L4-L5: There is minimal anterolisthesis and right greater than left facet hypertrophy and other degenerative change causing moderately severe right foraminal narrowing with potential for right L4 nerve root compression.  No spinal stenosis.  REVIEW OF SYSTEMS: Constitutional: No fevers, chills, sweats, or change in appetite Eyes: No visual changes, double vision, eye pain Ear, nose and throat: No hearing loss, ear pain, nasal congestion, sore throat Cardiovascular: No chest pain, palpitations Respiratory:  No shortness of breath at rest or with exertion.   No wheezes GastrointestinaI: No nausea, vomiting, diarrhea, abdominal pain, fecal incontinence Genitourinary:  No dysuria, urinary retention or frequency.  No nocturia. Musculoskeletal:  No neck pain, back  pain Integumentary: No rash, pruritus, skin lesions Neurological: as above Psychiatric: No depression at this time.  No anxiety Endocrine: No palpitations, diaphoresis, change in appetite, change in weigh or increased thirst Hematologic/Lymphatic:  No anemia, purpura, petechiae. Allergic/Immunologic: No itchy/runny eyes, nasal congestion, recent allergic reactions, rashes  ALLERGIES: Allergies  Allergen Reactions   Plaquenil [Hydroxychloroquine]     Retinal damage   Sulfa Antibiotics Nausea And Vomiting   Penicillins Rash    HOME MEDICATIONS:  Current Outpatient Medications:    acetaminophen (TYLENOL) 325 MG tablet, Take 650 mg by mouth as needed., Disp: , Rfl:    acyclovir (ZOVIRAX) 400 MG tablet, Take 1 tablet (400 mg total) by mouth daily., Disp: 90 tablet, Rfl: 1   amLODipine (NORVASC) 5 MG tablet, Take 1 tablet (5 mg total) by mouth daily., Disp: 90  tablet, Rfl: 1   aspirin EC 81 MG tablet, Take 1 tablet (81 mg total) by mouth daily. Swallow whole. (Patient taking differently: Take 81 mg by mouth at bedtime. Swallow whole.), Disp: 30 tablet, Rfl: 12   azaTHIOprine (IMURAN) 50 MG tablet, Take 1 tablet (50 mg total) by mouth daily., Disp: 30 tablet, Rfl: 0   baclofen (LIORESAL) 10 MG tablet, Take 1 tablet (10 mg total) by mouth 3 (three) times daily. (Patient taking differently: Take 10 mg by mouth as needed.), Disp: 90 each, Rfl: 5   carbamazepine (CARBATROL) 300 MG 12 hr capsule, TAKE 1 CAPSULE(300 MG) BY MOUTH DAILY (Patient taking differently: Take 300 mg by mouth at bedtime.), Disp: 90 capsule, Rfl: 0   clopidogrel (PLAVIX) 75 MG tablet, Take 1 tablet (75 mg total) by mouth daily., Disp: 30 tablet, Rfl: 11   gabapentin (NEURONTIN) 300 MG capsule, Take 300 mg by mouth 3 (three) times daily., Disp: , Rfl:    hydrochlorothiazide (MICROZIDE) 12.5 MG capsule, Take 1 capsule (12.5 mg total) by mouth daily., Disp: 30 capsule, Rfl: 11   hydrOXYzine (ATARAX) 10 MG tablet, Take 1 tablet  (10 mg total) by mouth every 8 (eight) hours as needed. (Patient taking differently: Take 10 mg by mouth every 8 (eight) hours as needed for anxiety.), Disp: 270 tablet, Rfl: 0   meclizine (ANTIVERT) 25 MG tablet, Take 25 mg by mouth daily as needed for dizziness., Disp: , Rfl:    pantoprazole (PROTONIX) 40 MG tablet, Take 1 tablet (40 mg total) by mouth daily., Disp: 90 tablet, Rfl: 3   rosuvastatin (CRESTOR) 20 MG tablet, Take 1 tablet (20 mg total) by mouth daily., Disp: 90 tablet, Rfl: 1   telmisartan-hydrochlorothiazide (MICARDIS HCT) 40-12.5 MG tablet, TAKE 1 TABLET BY MOUTH DAILY, Disp: 90 tablet, Rfl: 0   tizanidine (ZANAFLEX) 2 MG capsule, Take 4 mg by mouth at bedtime., Disp: , Rfl:    traMADol (ULTRAM) 50 MG tablet, Take 50 mg by mouth 2 (two) times daily., Disp: , Rfl:    venlafaxine XR (EFFEXOR-XR) 37.5 MG 24 hr capsule, TAKE 2 CAPSULES(75 MG) BY MOUTH DAILY WITH BREAKFAST (Patient taking differently: Take 75 mg by mouth at bedtime.), Disp: 180 capsule, Rfl: 1  PAST MEDICAL HISTORY: Past Medical History:  Diagnosis Date   Closed fracture of right distal radius    Gastroparesis    GERD (gastroesophageal reflux disease)    Hypertension    Irritable bowel disease    Lupus ([place])    Major depression    Stroke ([place])    Trigeminal neuralgia     PAST SURGICAL HISTORY: Past Surgical History:  Procedure Laterality Date   BUBBLE STUDY  07/05/2022   Procedure: BUBBLE STUDY;  Surgeon: Elouise Munroe, MD;  Location: [place];  Service: Cardiology;;   CHOLECYSTECTOMY     COLONOSCOPY     LOOP RECORDER IMPLANT  2021   ORIF WRIST FRACTURE Right 09/22/2020   Procedure: OPEN REDUCTION INTERNAL FIXATION (ORIF) WRIST FRACTURE;  Surgeon: Verner Mould, MD;  Location: [place];  Service: Orthopedics;  Laterality: Right;  79min   TEE WITHOUT CARDIOVERSION N/A 07/05/2022   Procedure: TRANSESOPHAGEAL ECHOCARDIOGRAM (TEE);  Surgeon: Elouise Munroe, MD;   Location: [place];  Service: Cardiology;  Laterality: N/A;    FAMILY HISTORY: Family History  Problem Relation Age of Onset   Heart block Mother        Pacemaker   Hypertension Mother    Depression Mother  Gout Mother    Macular degeneration Mother    Alcoholism Father    Congestive Heart Failure Maternal Grandmother    Diabetes Maternal Grandmother    Congestive Heart Failure Maternal Grandfather    Colon cancer Maternal Grandfather    Stroke Paternal Grandfather    Hypertension Paternal Grandfather     SOCIAL HISTORY:  Social History   Socioeconomic History   Marital status: Widowed    Spouse name: Not on file   Number of children: 0   Years of education: Not on file   Highest education level: Bachelor's degree (e.g., BA, AB, BS)  Occupational History    Comment: RN ortho/neuro at [employer]  Tobacco Use   Smoking status: Never    Passive exposure: Never   Smokeless tobacco: Never  Vaping Use   Vaping Use: Never used  Substance and Sexual Activity   Alcohol use: Yes    Comment: social   Drug use: Never   Sexual activity: Not Currently    Birth control/protection: Post-menopausal  Other Topics Concern   Not on file  Social History Narrative   06/13/22 living with her mother   Social Determinants of Health   Financial Resource Strain: Not on file  Food Insecurity: Not on file  Transportation Needs: Not on file  Physical Activity: Not on file  Stress: Not on file  Social Connections: Not on file  Intimate Partner Violence: Not on file     PHYSICAL EXAM  There were no vitals filed for this visit.   There is no height or weight on file to calculate BMI.   General: The patient is well-developed and well-nourished and in no acute distress  HEENT:  Head is [place]/AT.  Sclera are anicteric.    Skin: Extremities are without rash or  edema.  Musculoskeletal:  Back is nontender  Neurologic Exam  Mental status: The patient is alert and oriented x 3 at  the time of the examination. No obvious memory issue during visit.   Speech shows mild word finding difficulty.  She was able to name objects pointed to around the room.  Cranial nerves: Extraocular movements are full.    She has symmetric facial sensation .Facial strength is normal.  Trapezius and sternocleidomastoid strength is normal. No dysarthria is noted.  The tongue is midline, and the patient has symmetric elevation of the soft palate. No obvious hearing deficits are noted.  Motor:  Muscle bulk is normal.   Tone is normal. Strength is  5 / 5 in the arms and right leg.  Strength was 4+/5 in the left EHL..   Sensory: Sensory testing is intact to pinprick, soft touch and vibration sensation in the arms but she had reduced sensation to touch in the distribution of the left lateral femoral cutaneous nerve..  Coordination: Cerebellar testing reveals good finger-nose-finger and heel-to-shin bilaterally.  Gait and station: Station is normal.   Gait is near normal. Tandem gait is wide. Romberg is negative.   Reflexes: Deep tendon reflexes are symmetric and normal in the arms  3 inlegs with spread at knees but no clonus at ankles.    DIAGNOSTIC DATA (LABS, IMAGING, TESTING) - I reviewed patient records, labs, notes, testing and imaging myself where available.  Lab Results  Component Value Date   WBC 3.9 07/02/2022   HGB 12.2 07/02/2022   HCT 36.5 07/02/2022   MCV 92 07/02/2022   PLT 373 07/02/2022      Component Value Date/Time   NA 137 07/02/2022  0812   K 3.8 07/02/2022 0812   CL 99 07/02/2022 0812   CO2 22 07/02/2022 0812   GLUCOSE 94 07/02/2022 0812   GLUCOSE 102 (H) 06/22/2022 1127   BUN 15 07/02/2022 0812   CREATININE 1.09 (H) 07/02/2022 0812   CREATININE 0.97 06/22/2022 1127   CALCIUM 9.5 07/02/2022 0812   PROT 7.1 06/22/2022 1127   PROT 7.1 06/22/2022 1127   ALBUMIN 3.9 05/07/2022 0905   AST 25 06/22/2022 1127   ALT 21 06/22/2022 1127   ALKPHOS 68 05/07/2022 0905    BILITOT 0.5 06/22/2022 1127   GFRNONAA >60 05/08/2022 0424   GFRAA >90 07/07/2012 0610   Lab Results  Component Value Date   CHOL 238 (H) 05/08/2022   HDL 109 05/08/2022   LDLCALC 105 (H) 05/08/2022   TRIG 119 05/08/2022   CHOLHDL 2.2 05/08/2022   Lab Results  Component Value Date   HGBA1C 5.7 (H) 05/08/2022   No results found for: "VITAMINB12" Lab Results  Component Value Date   TSH 1.45 11/15/2021       ASSESSMENT AND PLAN  No diagnosis found.  T2 foci are nonspecific.  No evidence of demyelination in spinal cord.   Likely ischemic given her history. For the stroke and antiphospholipid syndrome, continue Plavix and aspirin for now.   Change to just aspirin if any bleeding. OK for Arava if decided on by Rheumatology. For the spasticity stop tizanidine and try to get to baclofen 10 mg po tid.   Refer to speech and neurocognitve therapy.  She will return to see me in 2 months or sooner based on the results of the studies.  She should also call if new or worsening symptoms.   45-minute office visit with the majority of the time spent face-to-face for history and physical, discussion/counseling and decision-making.  Additional time with record review and documentation.   Ketrina Boateng A. Epimenio FootSater, MD, [facility] Center[facility],FAAN 08/15/2022, 9:49 AM Certified in Neurology, Clinical Neurophysiology, Sleep Medicine and Neuroimaging  [facility] [address], [address] [place], [place] [id number] [phone]

## 2022-08-20 ENCOUNTER — Telehealth: Payer: Self-pay | Admitting: *Deleted

## 2022-08-20 NOTE — Telephone Encounter (Signed)
Gave completed/signed [employer] form back to medical records to process for pt.

## 2022-08-23 NOTE — Therapy (Signed)
OUTPATIENT SPEECH LANGUAGE PATHOLOGY EVALUATION   Patient Name: Debbie Hatfield MRN: [id number] DOB:Sep 19, 1956, 66 y.o., female Today's Date: 08/24/2022  PCP: Dr. [name] Linger REFERRING PROVIDER: Dr. Despina Arias   End of Session - 08/24/22 1229     Visit Number 1    Number of Visits 17    Date for SLP Re-Evaluation 11/02/22   for scheduling   Authorization Type Medicare    Progress Note Due on Visit 10    SLP Start Time 1013    SLP Stop Time  1100    SLP Time Calculation (min) 47 min    Activity Tolerance Patient tolerated treatment well             Past Medical History:  Diagnosis Date   Closed fracture of right distal radius    Gastroparesis    GERD (gastroesophageal reflux disease)    Hypertension    Irritable bowel disease    Lupus (HCC)    Major depression    Stroke [facility])    Trigeminal neuralgia    Past Surgical History:  Procedure Laterality Date   BUBBLE STUDY  07/05/2022   Procedure: BUBBLE STUDY;  Surgeon: Parke Poisson, MD;  Location: [facility] ENDOSCOPY;  Service: Cardiology;;   CHOLECYSTECTOMY     COLONOSCOPY     LOOP RECORDER IMPLANT  2021   ORIF WRIST FRACTURE Right 09/22/2020   Procedure: OPEN REDUCTION INTERNAL FIXATION (ORIF) WRIST FRACTURE;  Surgeon: Ernest Mallick, MD;  Location: [facility];  Service: Orthopedics;  Laterality: Right;    TEE WITHOUT CARDIOVERSION N/A 07/05/2022   Procedure: TRANSESOPHAGEAL ECHOCARDIOGRAM (TEE);  Surgeon: Parke Poisson, MD;  Location: [facility] ENDOSCOPY;  Service: Cardiology;  Laterality: N/A;   Patient Active Problem List   Diagnosis Date Noted   Nonrheumatic mitral valve regurgitation 05/10/2022   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   Anemia, chronic disease 05/06/2022   Hyperlipidemia LDL goal <100 03/26/2022   Recurrent major depressive disorder, in partial remission (HCC) 11/15/2021   Need for shingles vaccine 11/15/2021   Visit for screening mammogram 11/15/2021   Need for  vaccination 11/15/2021   GAD (generalized anxiety disorder) 11/15/2021   Stage 3a chronic kidney disease (HCC) 11/15/2021   Trigeminal neuralgia    Lupus (HCC)    Irritable bowel disease    GERD (gastroesophageal reflux disease)    Hypertension    Gastroparesis    Degeneration of lumbar intervertebral disc 01/26/2020   Osteopenia 12/14/2015   Peptic ulcer disease 12/14/2015    ONSET DATE: referral 08/15/2022   REFERRING DIAG: I63.311 (ICD-10-CM) - Cerebrovascular accident (CVA) due to thrombosis of right middle cerebral artery (HCC)   THERAPY DIAG:  No diagnosis found.  Rationale for Evaluation and Treatment: Rehabilitation  SUBJECTIVE:   SUBJECTIVE STATEMENT: "Cognitive and word recall" re: what brings pt in for ST evaluation  Pt accompanied by: self  PERTINENT HISTORY: Stroke 2020 with ongoing issues with cognition, expressive aphasia, and auditory comprehension in presence of distractions. Has received HH ST since d/c, targeting word finding and dysarthria   PAIN:  Are you having pain? No  FALLS: Has patient fallen in last 6 months?  Yes, Comment: finished course of PT 3 weeks ago  LIVING ENVIRONMENT: Lives with:  mother who is 63 whom she A in caring for Lives in: House/apartment  PLOF:  Level of assistance: Independent Employment: Applying for long term disability, reports does not feel confident in return to work at this time d/t cognitive and  language deficits   PATIENT GOALS: improved control over tongue, better word finding  OBJECTIVE:   DIAGNOSTIC FINDINGS: MRI of the brain 05/07/2022 showed an acute stroke in the right frontoparietal region.  She has multiple T2/FLAIR hypertense foci.  Many are nonspecific but some are periventricular and radially oriented to the ventricles.  CT angiogram showed no large vessel occlusion or significant stenosis.   CT angiogram of the head and neck 05/07/2022 showed no emergent large vessel occlusion.  A thyroid nodule was  noted.  COGNITION: Overall cognitive status:  pt c/o chance in cognition, to be formally assessed at subsequent therapy session.  Areas of impairment:  To be determined Comments: SLP questions differences in cognition with only example pt able to provide at time of initial eval being "remembering words." Suspect may have deficits in executive functioning, attention, and processing based on today's conversation.   COGNITIVE COMMUNICATION: Following directions: Impaired at moderately complex level Auditory comprehension: Impaired: conversation, especially in presence of distractions Verbal expression: Impaired: mild, high-level word finding, evidencing occasional (x2) semantic parapahsias during evaluation and demonstrating usual extended time to respond to questions Functional communication: Impaired: word finding and auditory comprehension deficits impacting pt's confidence in communication. Paraphasias and anomia with potential to negatively impact pt's discourse cohesion. 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   MOTOR SPEECH: Rate of speech: unremarkable Dysfluencies: none evidenced Vocal loudness: reduced volume (averages mid 60s in conversation) Voice quality: monopitch, reduced phonation time Word and phrasal stress: reduced demonstration of stress Articulation: WFL Resonance: WFL Diadochokinetic rate: irregular rhythm, reduced rate Overall intelligibility: 100%  Comments: assess informally in conversational speech. Pt c/o tongue feeling "heavy" and like it is "twisting" R. Does not impact articulation or intelligibility.   ORAL MOTOR EXAMINATION: Overall status: Impaired:   Lingual: Bilateral (Coordination) Comments: fasciculations observed at rest, demonstrated full ROM, however required extra effort and motions were observed to not be fluid or effortless   ASSESSMENTS: Hopkins Action  Naming Assessment [HANA]: 10/30 correct initial attempt; increased accuracy to 19/30 with verbal cues; pt with usual provision of objects presented despite repeated instructions for needing to provide associated action Hopkins Auditory Comprehension with Contxt Assessment [HACCA] (Auditory comprehension):  Following Directions: 5/5 simple, single step commands; 1/2 single step, multi-component commands; 3/3 multistep commands Discourse Comprehension: 8/10 accuracy ID appropriate photo from FO4 with related foils with door open and light background noise  PATIENT REPORTED OUTCOME MEASURES (PROM): Deferred d/t time constraints    TODAY'S TREATMENT:  08-24-22: Education provided on evaluation results and SLP's recommendations. Collaborated with pt to formulate POC and goals. Pt verbalizes agreement with POC, all questions answered to satisfaction at conclusion of session. SLP advised to continue with HEP established for dysarthria by [facility] SLP.   PATIENT EDUCATION: Education details: see above Person educated: Patient Education method: Medical illustrator Education comprehension: verbalized understanding, returned demonstration, and needs further education   GOALS: Goals reviewed with patient? Yes  SHORT TERM GOALS: Target date: 10/05/2022  Pt will complete standardized cognitive assessment and/or patient reported outcome measure to assess need for cognitive interventions Baseline: Goal status: INITIAL  2.  Pt will demonstrate auditory processing strategies in structured activities with rare min-A Baseline:  Goal status: INITIAL  3.  Pt will complete moderately complex structured language tasks with 80% accuracy given occasional min-A over 2 sessions Baseline:  Goal status: INITIAL  4.  Pt will demonstrate over-articulation and pacing dysarthria strategies  in moderately complex speech tasks with 80% accuracy given rare min-A Baseline:  Goal status: INITIAL   LONG TERM  GOALS: Target date: 11/02/2022  Pt will employ auditory comprehension strategies during 15 minute conversation in busy environment (e.g. therapy gym), resulting in ability to respond appropriately to comments and questions in conversation with rare min-A Baseline:  Goal status: INITIAL  2.  Pt will complete moderately complex structured language tasks with 90% accuracy given rare min-A over 2 sessions Baseline:  Goal status: INITIAL  3.  Pt will employ anomia compensations in 80% of opportunities, over 10 minute conversation, with rare min-A Baseline:  Goal status: INITIAL  ASSESSMENT:  CLINICAL IMPRESSION: Patient is a 66 y.o. F who was seen today for cognitive linguistic evaluation s/p stroke. Pt reporting ongoing memory, word finding, and dysarthria. Paraphasias evidenced x2 this date and overt difficulties noted in verb naming assessment. Pt's goal is to return to work if able. Barrier for pt for communication and cognition is current challenge of sensory sensitivity which is new following stroke. Prior to stroke, pt reports no sensory issues but did experience change in mood. Cognition to be assessed at initial therapy session, unable to complete this date d/t time constraints. Goals will be updated to address cognition PRN. Skilled ST is indicated to maximize cognition, speech, and language, and facilitate potential return to work.  OBJECTIVE IMPAIRMENTS: include attention, memory, executive functioning, aphasia, and dysarthria. These impairments are limiting patient from return to work, ADLs/IADLs, and effectively communicating at home and in community. Factors affecting potential to achieve goals and functional outcome are  lack of support . Patient will benefit from skilled SLP services to address above impairments and improve overall function.  REHAB POTENTIAL: Good  PLAN:  SLP FREQUENCY: 2x/week  SLP DURATION: 10 weeks  PLANNED INTERVENTIONS: Language facilitation, Cueing  hierachy, Cognitive reorganization, Internal/external aids, Oral motor exercises, Functional tasks, SLP instruction and feedback, Compensatory strategies, and Patient/family education    Maia Breslow, CCC-SLP 08/24/2022, 12:31 PM

## 2022-08-24 ENCOUNTER — Other Ambulatory Visit: Payer: Self-pay

## 2022-08-24 ENCOUNTER — Encounter: Payer: Self-pay | Admitting: Speech Pathology

## 2022-08-24 ENCOUNTER — Ambulatory Visit: Payer: Medicare Other | Attending: Neurology | Admitting: Speech Pathology

## 2022-08-24 DIAGNOSIS — R4701 Aphasia: Secondary | ICD-10-CM | POA: Insufficient documentation

## 2022-08-24 DIAGNOSIS — R471 Dysarthria and anarthria: Secondary | ICD-10-CM | POA: Insufficient documentation

## 2022-08-24 DIAGNOSIS — R41841 Cognitive communication deficit: Secondary | ICD-10-CM | POA: Insufficient documentation

## 2022-08-27 ENCOUNTER — Ambulatory Visit: Payer: Medicare Other | Admitting: Neurology

## 2022-09-04 NOTE — Progress Notes (Signed)
Carelink Summary Report / Loop Recorder 

## 2022-09-05 NOTE — Progress Notes (Signed)
Office Visit Note  Patient: Debbie Hatfield             Date of Birth: [birth year]           MRN: [id number]             PCP: Asa Lente, MD Referring: Etta Grandchild, MD Visit Date: 09/19/2022 Occupation: @GUAROCC @  Subjective:  Medication management  History of Present Illness: Debbie MCDONELL is a 66 y.o. female with history of systemic lupus, anticardiolipin antibodies and degenerative disc disease.  She returns today after her last visit in October 2023.  She took 1 dose of Imuran and had to discontinue that due to muscle pains and uncontrollable shaking.  She is not on any immunosuppressive agents.  She has not noticed any difference in her symptoms.  She continues to take Plavix and aspirin as prescribed by Dr. November 2023.  She states gradually her strength is improving in her left lower extremity.  She is able to walk without any assistance now.  She has noticed improvement in her speech and recall as well.  She is planning to return to work part-time.  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  She denies any history of oral ulcers, nasal ulcers, malar rash, photosensitivity, sicca symptoms or lymphadenopathy.  She continues to have Raynauds symptoms.  Activities of Daily Living:  Patient reports morning stiffness for 15 minutes.   Patient Reports nocturnal pain.  Difficulty dressing/grooming: Denies Difficulty climbing stairs: Denies Difficulty getting out of chair: Reports Difficulty using hands for taps, buttons, cutlery, and/or writing: Reports  Review of Systems  Constitutional:  Positive for fatigue.  HENT:  Negative for mouth sores and mouth dryness.   Eyes:  Negative for dryness.  Respiratory:  Negative for shortness of breath.   Cardiovascular:  Negative for chest pain and palpitations.  Gastrointestinal:  Positive for constipation. Negative for blood in stool and diarrhea.  Endocrine: Negative for increased urination.  Genitourinary:   Negative for involuntary urination.  Musculoskeletal:  Positive for joint pain, gait problem, joint pain, myalgias, muscle weakness, morning stiffness, muscle tenderness and myalgias. Negative for joint swelling.  Skin:  Positive for color change. Negative for rash, hair loss and sensitivity to sunlight.  Allergic/Immunologic: Negative for susceptible to infections.  Neurological:  Negative for dizziness and headaches.  Hematological:  Positive for swollen glands.  Psychiatric/Behavioral:  Positive for depressed mood. Negative for sleep disturbance. The patient is nervous/anxious.     PMFS History:  Patient Active Problem List   Diagnosis Date Noted   Nonrheumatic mitral valve regurgitation 05/10/2022   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   Anemia, chronic disease 05/06/2022   Hyperlipidemia LDL goal <100 03/26/2022   Recurrent major depressive disorder, in partial remission (HCC) 11/15/2021   Need for shingles vaccine 11/15/2021   Visit for screening mammogram 11/15/2021   Need for vaccination 11/15/2021   GAD (generalized anxiety disorder) 11/15/2021   Stage 3a chronic kidney disease (HCC) 11/15/2021   Trigeminal neuralgia    Lupus (HCC)    Irritable bowel disease    GERD (gastroesophageal reflux disease)    Hypertension    Gastroparesis    Degeneration of lumbar intervertebral disc 01/26/2020   Osteopenia 12/14/2015   Peptic ulcer disease 12/14/2015    Past Medical History:  Diagnosis Date   Closed fracture of right distal radius    Gastroparesis    GERD (gastroesophageal reflux disease)    Hypertension  Irritable bowel disease    Lupus (HCC)    Major depression    Stroke [facility])    Trigeminal neuralgia     Family History  Problem Relation Age of Onset   Heart block Mother        Pacemaker   Hypertension Mother    Depression Mother    Gout Mother    Macular degeneration Mother    Aneurysm Mother    Alcoholism Father    Congestive Heart Failure Maternal  Grandmother    Diabetes Maternal Grandmother    Congestive Heart Failure Maternal Grandfather    Colon cancer Maternal Grandfather    Stroke Paternal Grandfather    Hypertension Paternal Grandfather    Past Surgical History:  Procedure Laterality Date   BUBBLE STUDY  07/05/2022   Procedure: BUBBLE STUDY;  Surgeon: Parke Poisson, MD;  Location: [facility] ENDOSCOPY;  Service: Cardiology;;   CHOLECYSTECTOMY     COLONOSCOPY     LOOP RECORDER IMPLANT  2021   ORIF WRIST FRACTURE Right 09/22/2020   Procedure: OPEN REDUCTION INTERNAL FIXATION (ORIF) WRIST FRACTURE;  Surgeon: Ernest Mallick, MD;  Location: [facility];  Service: Orthopedics;  Laterality: Right;    TEE WITHOUT CARDIOVERSION N/A 07/05/2022   Procedure: TRANSESOPHAGEAL ECHOCARDIOGRAM (TEE);  Surgeon: Parke Poisson, MD;  Location: [facility] ENDOSCOPY;  Service: Cardiology;  Laterality: N/A;   Social History   Social History Narrative   06/13/22 living with her mother   Immunization History  Administered Date(s) Administered   Influenza,inj,Quad PF,6-35 Mos 06/17/2019   Influenza-Unspecified 09/02/2020, 07/27/2021   Moderna SARS-COV2 Booster Vaccination 09/02/2020   Moderna Sars-Covid-2 Vaccination 10/23/2019, 11/20/2019   PNEUMOCOCCAL CONJUGATE-20 11/15/2021   Tdap 03/28/2014, 12/29/2020     Objective: Vital Signs: BP (!) 173/79 (BP Location: Left Arm, Patient Position: Sitting, Cuff Size: Normal)   Pulse 67   Resp 15   Ht 5\' 2"  (1.575 m)   Wt 150 lb 12.8 oz (68.4 kg)   BMI 27.58 kg/m    Physical Exam Vitals and nursing note reviewed.  Constitutional:      Appearance: She is well-developed.  HENT:     Head: Normocephalic and atraumatic.  Eyes:     Conjunctiva/sclera: Conjunctivae normal.  Cardiovascular:     Rate and Rhythm: Normal rate and regular rhythm.     Heart sounds: Normal heart sounds.  Pulmonary:     Effort: Pulmonary effort is normal.     Breath sounds: Normal breath sounds.   Abdominal:     General: Bowel sounds are normal.     Palpations: Abdomen is soft.  Musculoskeletal:     Cervical back: Normal range of motion.  Lymphadenopathy:     Cervical: No cervical adenopathy.  Skin:    General: Skin is warm and dry.     Capillary Refill: Capillary refill takes less than 2 seconds.  Neurological:     Mental Status: She is alert and oriented to person, place, and time.  Psychiatric:        Behavior: Behavior normal.      Musculoskeletal Exam: Cervical, thoracic and lumbar spine were in good range of motion.  Shoulder joints, elbow joints, wrist joints, MCPs PIPs and DIPs been good range of motion with no synovitis.  Hip joints were in good range of motion.  Knee joints with good range of motion without any warmth swelling or effusion.  There was no tenderness over ankles or MTPs.  CDAI Exam: CDAI Score: --  Patient Global: --; Provider Global: -- Swollen: --; Tender: -- Joint Exam 09/19/2022   No joint exam has been documented for this visit   There is currently no information documented on the homunculus. Go to the Rheumatology activity and complete the homunculus joint exam.  Investigation: No additional findings.  Imaging: CUP PACEART REMOTE DEVICE CHECK  Result Date: 09/11/2022 ILR summary report received. Battery status OK. Normal device function. No new symptom, tachy, brady, or pause episodes. No new AF episodes. Monthly summary reports and ROV/PRN LA   Recent Labs: Lab Results  Component Value Date   WBC 3.9 07/02/2022   HGB 12.2 07/02/2022   PLT 373 07/02/2022   NA 137 07/02/2022   K 3.8 07/02/2022   CL 99 07/02/2022   CO2 22 07/02/2022   GLUCOSE 94 07/02/2022   BUN 15 07/02/2022   CREATININE 1.09 (H) 07/02/2022   BILITOT 0.5 06/22/2022   ALKPHOS 68 05/07/2022   AST 25 06/22/2022   ALT 21 06/22/2022   PROT 7.1 06/22/2022   PROT 7.1 06/22/2022   ALBUMIN 3.9 05/07/2022   CALCIUM 9.5 07/02/2022   GFRAA >90 07/07/2012    QFTBGOLDPLUS NEGATIVE 06/22/2022    Speciality Comments: Ocular toxicity from Plaquenil use MTX 6 tablets p.o. weekly caused muscle pain Imuran-myalgias  Procedures:  No procedures performed Allergies: Plaquenil [hydroxychloroquine], Sulfa antibiotics, and Penicillins   Assessment / Plan:     Visit Diagnoses: Lupus (HCC) - dxd at age 66.H/o +ANA, fatigue and myalgias.  PLQ discontinued in 2023 due to ocular toxicity. She has sicca symptoms, fatigue and recent history of stroke: She developed left-sided weakness, dysphagia and dysarthria and problems with recall which all symptoms are gradually improving.  She states she is able to ambulate without any assistance now.  She is planning to return back to work part-time.  All her autoimmune antibodies were negative except for anticardiolipin IgG and beta-2 GP 1 IgG.  She had been taking Plavix and aspirin.  She was treated with Plaquenil by her husband several years ago which was discontinued after developing ocular toxicity.  Methotrexate caused muscle pain.  Imuran caused myalgias and chills.  I do not see any need to start her on immunosuppressive agent at this point.  Patient voiced understanding.  Next step will be to add CellCept or Arava but she did not like the side effects in the past when discussed.  High risk medication use - Not currently taking any immunosuppressive agents. Discontinued imuran after 1 dose on 08/04/22 due to tremors and myalgias.  Methotrexate caused myalgias in the past.  Future options could be leflunomide or CellCept if needed.  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 Under care of Dr. Epimenio FootSater.  I would also refer patient to hematology for evaluation.  Patient is concerned that Plavix is causing bruising.  Cerebrovascular accident (CVA) due to thrombosis of  right middle cerebral artery [facility](HCC) - May 07, 2022 with left facial droop and dysarthria.  Symptoms are gradually improving.  She will be returning back to work.  Degeneration of lumbar intervertebral disc-she has chronic lower back pain.  Trochanteric bursitis, right hip-she has intermittent pain.  IT band stretches were discussed.  Chronic pain of both knees-improved.  Myalgia -improved after stopping all medications.  CK WNL on 06/22/22.  Primary hypertension-blood pressure was elevated at 166/73 today.  Repeat blood  pressure was 173/79.  She was advised to monitor blood pressure closely and follow-up with her PCP regarding management of hypertension.  Nonrheumatic mitral valve regurgitation  Stage 3a chronic kidney disease (HCC)-patient was evaluated by nephrology.  Her creatinine improved and no further intervention was needed.  Other medical problems listed as follows:  History of gastroesophageal reflux (GERD)  Hyperlipidemia LDL goal <100  Peptic ulcer disease  Gastroparesis  History of IBS  History of osteopenia-calcium rich diet and exercise was emphasized.  Anemia, chronic disease  Trigeminal neuralgia  GAD (generalized anxiety disorder)  Recurrent major depressive disorder, in partial remission (HCC)  Orders: Orders Placed This Encounter  Procedures   CBC with Differential/Platelet   COMPLETE METABOLIC PANEL WITH GFR   Protein / creatinine ratio, urine   Anti-DNA antibody, double-stranded   C3 and C4   Sedimentation rate   Cardiolipin antibodies, IgG, IgM, IgA   Beta-2 glycoprotein antibodies   Ambulatory referral to Hematology / Oncology   No orders of the defined types were placed in this encounter.  .  Follow-Up Instructions: Return in about 3 months (around 12/19/2022) for +ACL, +b2, Systemic lupus.   Pollyann Savoy, MD  Note - This record has been created using Animal nutritionist.  Chart creation errors have been sought, but may not always   have been located. Such creation errors do not reflect on  the standard of medical care.

## 2022-09-10 ENCOUNTER — Ambulatory Visit (INDEPENDENT_AMBULATORY_CARE_PROVIDER_SITE_OTHER): Payer: Medicare Other

## 2022-09-10 DIAGNOSIS — R55 Syncope and collapse: Secondary | ICD-10-CM

## 2022-09-11 ENCOUNTER — Ambulatory Visit: Payer: Medicare Other | Admitting: Internal Medicine

## 2022-09-11 LAB — CUP PACEART REMOTE DEVICE CHECK
Date Time Interrogation Session: 20231126231843
Implantable Pulse Generator Implant Date: 20220728

## 2022-09-11 NOTE — Therapy (Deleted)
OUTPATIENT SPEECH LANGUAGE PATHOLOGY TREATMENT   Patient Name: Debbie Hatfield MRN: [id number] DOB:1956/03/10, 66 y.o., female Today's Date: 09/11/2022  PCP: Dr. Scarlette Calico REFERRING PROVIDER: Dr. Arlice Colt     Past Medical History:  Diagnosis Date   Closed fracture of right distal radius    Gastroparesis    GERD (gastroesophageal reflux disease)    Hypertension    Irritable bowel disease    Lupus ([place])    Major depression    Stroke [facility])    Trigeminal neuralgia    Past Surgical History:  Procedure Laterality Date   BUBBLE STUDY  07/05/2022   Procedure: BUBBLE STUDY;  Surgeon: Elouise Munroe, MD;  Location: [place];  Service: Cardiology;;   CHOLECYSTECTOMY     COLONOSCOPY     LOOP RECORDER IMPLANT  2021   ORIF WRIST FRACTURE Right 09/22/2020   Procedure: OPEN REDUCTION INTERNAL FIXATION (ORIF) WRIST FRACTURE;  Surgeon: Verner Mould, MD;  Location: [place];  Service: Orthopedics;  Laterality: Right;  69min   TEE WITHOUT CARDIOVERSION N/A 07/05/2022   Procedure: TRANSESOPHAGEAL ECHOCARDIOGRAM (TEE);  Surgeon: Elouise Munroe, MD;  Location: [place];  Service: Cardiology;  Laterality: N/A;   Patient Active Problem List   Diagnosis Date Noted   Nonrheumatic mitral valve regurgitation 05/10/2022   Stroke (cerebrum) ([place]) 05/07/2022   Anemia, chronic disease 05/06/2022   Hyperlipidemia LDL goal <100 03/26/2022   Recurrent major depressive disorder, in partial remission ([place]) 11/15/2021   Need for shingles vaccine 11/15/2021   Visit for screening mammogram 11/15/2021   Need for vaccination 11/15/2021   GAD (generalized anxiety disorder) 11/15/2021   Stage 3a chronic kidney disease ([place]) 11/15/2021   Trigeminal neuralgia    Lupus ([place])    Irritable bowel disease    GERD (gastroesophageal reflux disease)    Hypertension    Gastroparesis    Degeneration of lumbar intervertebral disc 01/26/2020   Osteopenia 12/14/2015    Peptic ulcer disease 12/14/2015    ONSET DATE: referral 08/15/2022   REFERRING DIAG: I63.311 (ICD-10-CM) - Cerebrovascular accident (CVA) due to thrombosis of right middle cerebral artery ([place])   THERAPY DIAG:  No diagnosis found.  Rationale for Evaluation and Treatment: Rehabilitation  SUBJECTIVE:   SUBJECTIVE STATEMENT: " *** Pt accompanied by: self  PERTINENT HISTORY: Stroke 2020 with ongoing issues with cognition, expressive aphasia, and auditory comprehension in presence of distractions. Has received [place] since d/c, targeting word finding and dysarthria   PAIN: Are you having pain? No  FALLS: Has patient fallen in last 6 months?  Yes, Comment: finished course of PT 3 weeks ago  LIVING ENVIRONMENT: Lives with:  mother who is 51 whom she A in caring for Lives in: House/apartment  PLOF:  Level of assistance: Independent Employment: Applying for long term disability, reports does not feel confident in return to work at this time d/t cognitive and language deficits   PATIENT GOALS: improved control over tongue, better word finding  OBJECTIVE:   DIAGNOSTIC FINDINGS: MRI of the brain 05/07/2022 showed an acute stroke in the right frontoparietal region.  She has multiple T2/FLAIR hypertense foci.  Many are nonspecific but some are periventricular and radially oriented to the ventricles.  CT angiogram showed no large vessel occlusion or significant stenosis.   CT angiogram of the head and neck 05/07/2022 showed no emergent large vessel occlusion.  A thyroid nodule was noted.   TODAY'S TREATMENT:  09-11-22:  08-24-22: Education provided on evaluation results and  SLP's recommendations. Collaborated with pt to formulate POC and goals. Pt verbalizes agreement with POC, all questions answered to satisfaction at conclusion of session. SLP advised to continue with HEP established for dysarthria by [facility] SLP.   PATIENT EDUCATION: Education details: see above Person educated:  Patient Education method: Medical illustrator Education comprehension: verbalized understanding, returned demonstration, and needs further education   GOALS: Goals reviewed with patient? Yes  SHORT TERM GOALS: Target date: 10/05/2022  Pt will complete standardized cognitive assessment and/or patient reported outcome measure to assess need for cognitive interventions Baseline: Goal status: IN PROGRESS  2.  Pt will demonstrate auditory processing strategies in structured activities with rare min-A Baseline:  Goal status: IN PROGRESS  3.  Pt will complete moderately complex structured language tasks with 80% accuracy given occasional min-A over 2 sessions Baseline:  Goal status: IN PROGRESS  4.  Pt will demonstrate over-articulation and pacing dysarthria strategies in moderately complex speech tasks with 80% accuracy given rare min-A Baseline:  Goal status: IN PROGRESS   LONG TERM GOALS: Target date: 11/02/2022  Pt will employ auditory comprehension strategies during 15 minute conversation in busy environment (e.g. therapy gym), resulting in ability to respond appropriately to comments and questions in conversation with rare min-A Baseline:  Goal status: IN PROGRESS  2.  Pt will complete moderately complex structured language tasks with 90% accuracy given rare min-A over 2 sessions Baseline:  Goal status: IN PROGRESS  3.  Pt will employ anomia compensations in 80% of opportunities, over 10 minute conversation, with rare min-A Baseline:  Goal status: IN PROGRESS  ASSESSMENT:  CLINICAL IMPRESSION: Patient is a 66 y.o. F who was seen today for cognitive linguistic evaluation s/p stroke. Pt reporting ongoing memory, word finding, and dysarthria. Paraphasias evidenced x2 this date and overt difficulties noted in verb naming assessment. Pt's goal is to return to work if able. Barrier for pt for communication and cognition is current challenge of sensory sensitivity which  is new following stroke. Prior to stroke, pt reports no sensory issues but did experience change in mood. Cognition to be assessed at initial therapy session, unable to complete this date d/t time constraints. Goals will be updated to address cognition PRN. Skilled ST is indicated to maximize cognition, speech, and language, and facilitate potential return to work.  OBJECTIVE IMPAIRMENTS: include attention, memory, executive functioning, aphasia, and dysarthria. These impairments are limiting patient from return to work, ADLs/IADLs, and effectively communicating at home and in community. Factors affecting potential to achieve goals and functional outcome are  lack of support . Patient will benefit from skilled SLP services to address above impairments and improve overall function.  REHAB POTENTIAL: Good  PLAN:  SLP FREQUENCY: 2x/week  SLP DURATION: 10 weeks  PLANNED INTERVENTIONS: Language facilitation, Cueing hierachy, Cognitive reorganization, Internal/external aids, Oral motor exercises, Functional tasks, SLP instruction and feedback, Compensatory strategies, and Patient/family education    Gracy Racer, CCC-SLP 09/11/2022, 1:56 PM

## 2022-09-12 ENCOUNTER — Ambulatory Visit: Payer: Medicare Other

## 2022-09-12 DIAGNOSIS — R4701 Aphasia: Secondary | ICD-10-CM

## 2022-09-12 DIAGNOSIS — R471 Dysarthria and anarthria: Secondary | ICD-10-CM | POA: Diagnosis not present

## 2022-09-12 DIAGNOSIS — R41841 Cognitive communication deficit: Secondary | ICD-10-CM

## 2022-09-12 NOTE — Therapy (Signed)
OUTPATIENT SPEECH LANGUAGE PATHOLOGY TREATMENT   Patient Name: Debbie Hatfield MRN: [id number] DOB:[birth year], 66 y.o., female Today's Date: 09/12/2022  PCP: Dr. Scarlette Calico REFERRING PROVIDER: Dr. Arlice Colt   End of Session - 09/12/22 1018     Visit Number 2    Number of Visits 17    Date for SLP Re-Evaluation 11/02/22    Authorization Type Medicare    Progress Note Due on Visit 10    SLP Start Time 1017    SLP Stop Time  1100    SLP Time Calculation (min) 43 min    Activity Tolerance Patient tolerated treatment well              Past Medical History:  Diagnosis Date   Closed fracture of right distal radius    Gastroparesis    GERD (gastroesophageal reflux disease)    Hypertension    Irritable bowel disease    Lupus ([place])    Major depression    Stroke [facility])    Trigeminal neuralgia    Past Surgical History:  Procedure Laterality Date   BUBBLE STUDY  07/05/2022   Procedure: BUBBLE STUDY;  Surgeon: Elouise Munroe, MD;  Location: [place];  Service: Cardiology;;   CHOLECYSTECTOMY     COLONOSCOPY     LOOP RECORDER IMPLANT  2021   ORIF WRIST FRACTURE Right 09/22/2020   Procedure: OPEN REDUCTION INTERNAL FIXATION (ORIF) WRIST FRACTURE;  Surgeon: Verner Mould, MD;  Location: [place];  Service: Orthopedics;  Laterality: Right;  32mn   TEE WITHOUT CARDIOVERSION N/A 07/05/2022   Procedure: TRANSESOPHAGEAL ECHOCARDIOGRAM (TEE);  Surgeon: AElouise Munroe MD;  Location[place]  Service: Cardiology;  Laterality: N/A;   Patient Active Problem List   Diagnosis Date Noted   Nonrheumatic mitral valve regurgitation 05/10/2022   [place]) [place] 05/07/[place]   Anemia, chronic disease 05/06/2022   Hyperlipidemia LDL goal <100 03/26/2022   Recurrent major depressive disorder, in partial remission ([place] 11/15/2021   Need for shingles vaccine 11/15/2021   Visit for screening mammogram 11/15/2021   Need for vaccination  11/15/2021   GAD (generalized anxiety disorder) 11/15/2021   Stage 3a chronic kidney disease ([place] 11/15/2021   Trigeminal neuralgia    [place]upus ([place]    Irritable bowel disease    GERD (gastroesophageal reflux disease)    Hypertension    Gastroparesis    Degeneration of lumbar intervertebral disc 01/26/2020   Osteopenia 12/14/2015   Peptic ulcer disease 12/14/2015    ONSET DATE: referral 08/15/2022   REFERRING DIAG: I63.311 (ICD-10-CM) - Cerebrovascular accident (CVA) due to thrombosis of right middle cerebral artery ([place]   THERAPY DIAG:  Cognitive communication deficit  Dysarthria and anarthria  Aphasia  Rationale for Evaluation and Treatment: Rehabilitation  SUBJECTIVE:   SUBJECTIVE STATEMENT: "I wrote down the wrong appointment time" Pt accompanied by: self  PERTINENT HISTORY: Stroke 2020 with ongoing issues with cognition, expressive aphasia, and auditory comprehension in presence of distractions. Has received [place], targeting word finding and dysarthria   PAIN: Are you having pain? No  FALLS: Has patient fallen in last 6 months?  Yes, Comment: finished course of PT 3 weeks ago  LIVING ENVIRONMENT: Lives with:  mother who is 876whom she A in caring for Lives in: House/apartment  PLOF:  Level of assistance: Independent Employment: Applying for long term disability, reports does not feel confident in return to work at this time d/t cognitive and language deficits  PATIENT GOALS: improved control over tongue, better word finding  OBJECTIVE:   DIAGNOSTIC FINDINGS: MRI of the brain 05/07/2022 showed an acute stroke in the right frontoparietal region.  She has multiple T2/FLAIR hypertense foci.  Many are nonspecific but some are periventricular and radially oriented to the ventricles.  CT angiogram showed no large vessel occlusion or significant stenosis.   CT angiogram of the head and neck 05/07/2022 showed no emergent large vessel occlusion.  A thyroid  nodule was noted.   TODAY'S TREATMENT:  09-11-22: Missed earlier ST appointment due to written error in calendar. Pt c/o memory deficits, speech impairment ("My tongue feels like it turns sideways"), and word finding. Anomia evidenced x1, in which pt able to ID targeted work with additional time and cued use of description strategy. 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 Family providing some assistance with higher level cognitive tasks (I.e., setting up grocery delivery service). Pt frequently forgetting items on to-do and grocery lists. Educated recommended compensations and modifications to aid recall and attention for improved task completion. Discussed importance of cognitive breaks due to mental fatigue and need for self-advocacy with family members to receive increased assistance as needed. See patient instructions for additional details.   08-24-22: Education provided on evaluation results and SLP's recommendations. Collaborated with pt to formulate POC and goals. Pt verbalizes agreement with POC, all questions answered to satisfaction at conclusion of session. SLP advised to continue with HEP established for dysarthria by [facility] SLP.   PATIENT EDUCATION: Education details: see above Person educated: Patient Education method: Customer service manager Education comprehension: verbalized understanding, returned demonstration, and needs further education   GOALS: Goals reviewed with patient? Yes  SHORT TERM GOALS: Target date: 10/05/2022  Pt will complete standardized cognitive assessment and/or patient reported outcome measure to assess need for cognitive interventions Baseline: Cognitive Function PROM=46 Goal status: MET  2.  Pt will demonstrate auditory processing strategies in structured activities with rare min-A Baseline:  Goal status: IN PROGRESS  3.  Pt will complete moderately complex structured language tasks with 80% accuracy  given occasional min-A over 2 sessions Baseline:  Goal status: IN PROGRESS  4.  Pt will demonstrate over-articulation and pacing dysarthria strategies in moderately complex speech tasks with 80% accuracy given rare min-A Baseline:  Goal status: IN PROGRESS   LONG TERM GOALS: Target date: 11/02/2022  Pt will employ auditory comprehension strategies during 15 minute conversation in busy environment (e.g. therapy gym), resulting in ability to respond appropriately to comments and questions in conversation with rare min-A Baseline:  Goal status: IN PROGRESS  2.  Pt will complete moderately complex structured language tasks with 90% accuracy given rare min-A over 2 sessions Baseline:  Goal status: IN PROGRESS  3.  Pt will employ anomia compensations in 80% of opportunities, over 10 minute conversation, with rare min-A Baseline:  Goal status: IN PROGRESS  ASSESSMENT:  CLINICAL IMPRESSION: Patient is a 66 y.o. F who was seen today for cognitive linguistic evaluation s/p stroke. Pt reporting ongoing memory, word finding, and dysarthria. Pt's goal is to return to work if able. Initiated education and instruction of memory, attention, and processing strategies for accurate completion of functional household tasks. Skilled ST is indicated to maximize cognition, speech, and language, and facilitate potential return to work.  OBJECTIVE IMPAIRMENTS: include attention, memory, executive functioning, aphasia, and dysarthria. These impairments are limiting patient from return to work, ADLs/IADLs, and effectively communicating at home and in community. Factors affecting  potential to achieve goals and functional outcome are  lack of support . Patient will benefit from skilled SLP services to address above impairments and improve overall function.  REHAB POTENTIAL: Good  PLAN:  SLP FREQUENCY: 2x/week  SLP DURATION: 10 weeks  PLANNED INTERVENTIONS: Language facilitation, Cueing hierachy, Cognitive  reorganization, Internal/external aids, Oral motor exercises, Functional tasks, SLP instruction and feedback, Compensatory strategies, and Patient/family education    Marzetta Board, CCC-SLP 09/12/2022, 11:14 AM

## 2022-09-12 NOTE — Patient Instructions (Signed)
Remember, a brain injury is invisible to others. You may have to tell others what you are feeling/experiencing in order for them to understand what you are going through   Cognitive breaks are essential as your brain is still recovery from your stroke. You need intermittent 20-30 minutes to decompress and let your brain rest. Consider reducing the stimulation (quiet room, drawing blinds, close eyes) to let you brain rest   Write down to-do lists and check off as you go. Focus on 2-3 tasks per day.  I would recommend assigning things for other family members to help you accomplish as you still get fatigued   Consider another white board - this is great place to write appointments, to do lists, etc   Simplify your life and focus on your priorities  1. 2.  3.

## 2022-09-13 ENCOUNTER — Ambulatory Visit: Payer: Medicare Other | Admitting: Physician Assistant

## 2022-09-14 ENCOUNTER — Ambulatory Visit: Payer: Medicare Other | Attending: Neurology | Admitting: Speech Pathology

## 2022-09-14 DIAGNOSIS — R41841 Cognitive communication deficit: Secondary | ICD-10-CM

## 2022-09-14 DIAGNOSIS — R4701 Aphasia: Secondary | ICD-10-CM | POA: Diagnosis present

## 2022-09-14 DIAGNOSIS — R471 Dysarthria and anarthria: Secondary | ICD-10-CM | POA: Insufficient documentation

## 2022-09-14 NOTE — Patient Instructions (Addendum)
Delegate when able  Create opportunities for others to help you in ways which actually benefit you   Be clear about your boundaries, TELL people in PLAIN TERMS what you need  "I'm going to rest, please do not bother me for X hours"  "I do not need to go to the store right now, if you need something you can go or add it to a list and I'll get it next time"   Budget your time and energy Consider the different factors which impact your cognitive energy  Social - emotional- physical- cognitive    The metaphor for organizing your purse is wonderful: consider how you can organize your life  --we can talk more about this in a later visit--

## 2022-09-14 NOTE — Therapy (Signed)
OUTPATIENT SPEECH LANGUAGE PATHOLOGY TREATMENT   Patient Name: Debbie Hatfield MRN: [id number] DOB:[birth year], 66 y.o., female Today's Date: 09/14/2022  PCP: Dr. Scarlette Calico REFERRING PROVIDER: Dr. Arlice Colt   End of Session - 09/14/22 0932     Visit Number 3    Number of Visits 17    Date for SLP Re-Evaluation 11/02/22    Authorization Type Medicare    Progress Note Due on Visit 10    SLP Start Time 0932    SLP Stop Time  1015    SLP Time Calculation (min) 43 min    Activity Tolerance Patient tolerated treatment well               Past Medical History:  Diagnosis Date   Closed fracture of right distal radius    Gastroparesis    GERD (gastroesophageal reflux disease)    Hypertension    Irritable bowel disease    Lupus ([place])    Major depression    Stroke [facility])    Trigeminal neuralgia    Past Surgical History:  Procedure Laterality Date   BUBBLE STUDY  07/05/2022   Procedure: BUBBLE STUDY;  Surgeon: Elouise Munroe, MD;  Location: [place];  Service: Cardiology;;   CHOLECYSTECTOMY     COLONOSCOPY     LOOP RECORDER IMPLANT  2021   ORIF WRIST FRACTURE Right 09/22/2020   Procedure: OPEN REDUCTION INTERNAL FIXATION (ORIF) WRIST FRACTURE;  Surgeon: Verner Mould, MD;  Location: [place];  Service: Orthopedics;  Laterality: Right;  30mn   TEE WITHOUT CARDIOVERSION N/A 07/05/2022   Procedure: TRANSESOPHAGEAL ECHOCARDIOGRAM (TEE);  Surgeon: AElouise Munroe MD;  [place]  Service: Cardiology;  Laterality: N/A;   Patient Active Problem List   Diagnosis Date Noted   Nonrheumatic mitral valve regurgitation 05/10/2022   [place]) ([place] 05/07/2022   Anemia, chronic disease 05/06/2022   Hyperlipidemia LDL goal <100 03/26/2022   Recurrent major depressive disorder, in partial remission ([place] 11/15/2021   Need for shingles vaccine 11/15/2021   Visit for screening mammogram 11/15/2021   Need for vaccination  11/15/2021   GAD (generalized anxiety disorder) 11/15/2021   Stage 3a chronic kidney disease ([place] 11/15/2021   Trigeminal neuralgia    Lupus ([place]    Irritable bowel disease    GERD (gastroesophageal reflux disease)    Hypertension    Gastroparesis    Degeneration of lumbar intervertebral disc 01/26/2020   Osteopenia 12/14/2015   Peptic ulcer disease 12/14/2015    ONSET DATE: referral 08/15/2022   REFERRING DIAG: I63.311 (ICD-10-CM) - Cerebrovascular accident (CVA) due to thrombosis of right middle cerebral artery (HCC)   THERAPY DIAG:  Cognitive communication deficit  Aphasia  Rationale for Evaluation and Treatment: Rehabilitation  SUBJECTIVE:   SUBJECTIVE STATEMENT: Reports ongoing difficulties with cognitive fatigue d/t overwhelming circumstances at home with mother having had surgery and family visiting.    PAIN: Are you having pain? No   OBJECTIVE:   TODAY'S TREATMENT:  09-14-22: Re-education provided regarding cognitive pacing and fatigue management and strategies to aid in improved abilities to focus on and complete required or desired tasks. See handout. SLP A pt in ID factors which are cognitively draining vs fulfilling. Pt ID personal circumstances which have negative impact on cognitive functioning. SLP educated on meta cognitive strategy of re-framing to A in facilitation of brain breaks and pacing strategies as pt reports feeling guilt. Generated scripts pt can employ to be clear about boundaries to facilitate  pacing/breaks. SLP educated on anomia, paraphasias, and rehabilitation of language s/p stroke per pt's questions. SLP employed visual aid to A in understanding of lexical representation within the brain. Plan to target language in structured tasks in upcoming session.   09-11-22: Missed earlier ST appointment due to written error in calendar. Pt c/o memory deficits, speech impairment ("My tongue feels like it turns sideways"), and word finding. Anomia evidenced  x1, in which pt able to ID targeted work with additional time and cued use of description strategy. 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 Family providing some assistance with higher level cognitive tasks (I.e., setting up grocery delivery service). Pt frequently forgetting items on to-do and grocery lists. Educated recommended compensations and modifications to aid recall and attention for improved task completion. Discussed importance of cognitive breaks due to mental fatigue and need for self-advocacy with family members to receive increased assistance as needed. See patient instructions for additional details.   08-24-22: Education provided on evaluation results and SLP's recommendations. Collaborated with pt to formulate POC and goals. Pt verbalizes agreement with POC, all questions answered to satisfaction at conclusion of session. SLP advised to continue with HEP established for dysarthria by [facility] SLP.   PATIENT EDUCATION: Education details: see above Person educated: Patient Education method: Customer service manager Education comprehension: verbalized understanding, returned demonstration, and needs further education   GOALS: Goals reviewed with patient? Yes  SHORT TERM GOALS: Target date: 10/05/2022  Pt will complete standardized cognitive assessment and/or patient reported outcome measure to assess need for cognitive interventions Baseline: Cognitive Function PROM=46 Goal status: MET  2.  Pt will demonstrate auditory processing strategies in structured activities with rare min-A Baseline:  Goal status: IN PROGRESS  3.  Pt will complete moderately complex structured language tasks with 80% accuracy given occasional min-A over 2 sessions Baseline:  Goal status: IN PROGRESS  4.  Pt will demonstrate over-articulation and pacing dysarthria strategies in moderately complex speech tasks with 80% accuracy given rare min-A Baseline:   Goal status: IN PROGRESS   LONG TERM GOALS: Target date: 11/02/2022  Pt will employ auditory comprehension strategies during 15 minute conversation in busy environment (e.g. therapy gym), resulting in ability to respond appropriately to comments and questions in conversation with rare min-A Baseline:  Goal status: IN PROGRESS  2.  Pt will complete moderately complex structured language tasks with 90% accuracy given rare min-A over 2 sessions Baseline:  Goal status: IN PROGRESS  3.  Pt will employ anomia compensations in 80% of opportunities, over 10 minute conversation, with rare min-A Baseline:  Goal status: IN PROGRESS  ASSESSMENT:  CLINICAL IMPRESSION: Patient is a 66 y.o. F who was seen today for cognitive linguistic evaluation s/p stroke. Pt reporting ongoing memory, word finding, and dysarthria. Pt's goal is to return to work if able. Initiated education and instruction of memory, attention, and processing strategies for accurate completion of functional household tasks. Skilled ST is indicated to maximize cognition, speech, and language, and facilitate potential return to work.  OBJECTIVE IMPAIRMENTS: include attention, memory, executive functioning, aphasia, and dysarthria. These impairments are limiting patient from return to work, ADLs/IADLs, and effectively communicating at home and in community. Factors affecting potential to achieve goals and functional outcome are  lack of support . Patient will benefit from skilled SLP services to address above impairments and improve overall function.  REHAB POTENTIAL: Good  PLAN:  SLP FREQUENCY: 2x/week  SLP DURATION: 10 weeks  PLANNED INTERVENTIONS: Language  facilitation, Cueing hierachy, Cognitive reorganization, Internal/external aids, Oral motor exercises, Functional tasks, SLP instruction and feedback, Compensatory strategies, and Patient/family education    Su Monks, CCC-SLP 09/14/2022, 9:33 AM

## 2022-09-18 ENCOUNTER — Ambulatory Visit: Payer: Medicare Other | Admitting: Speech Pathology

## 2022-09-18 DIAGNOSIS — R4701 Aphasia: Secondary | ICD-10-CM

## 2022-09-18 DIAGNOSIS — R41841 Cognitive communication deficit: Secondary | ICD-10-CM

## 2022-09-18 DIAGNOSIS — R471 Dysarthria and anarthria: Secondary | ICD-10-CM

## 2022-09-18 NOTE — Therapy (Signed)
OUTPATIENT SPEECH LANGUAGE PATHOLOGY TREATMENT   Patient Name: Debbie Hatfield MRN: [id number] DOB:[birth year], 66 y.o., female Today's Date: 09/18/2022  PCP: Dr. Scarlette Calico REFERRING PROVIDER: Dr. Arlice Colt   End of Session - 09/18/22 1014     Visit Number 4    Number of Visits 17    Date for SLP Re-Evaluation 11/02/22    Authorization Type Medicare    Progress Note Due on Visit 10    SLP Start Time 1015    SLP Stop Time  1100    SLP Time Calculation (min) 45 min    Activity Tolerance Patient tolerated treatment well               Past Medical History:  Diagnosis Date   Closed fracture of right distal radius    Gastroparesis    GERD (gastroesophageal reflux disease)    Hypertension    Irritable bowel disease    Lupus ([place])    Major depression    Stroke [facility])    Trigeminal neuralgia    Past Surgical History:  Procedure Laterality Date   BUBBLE STUDY  07/05/2022   Procedure: BUBBLE STUDY;  Surgeon: Elouise Munroe, MD;  Location: [place];  Service: Cardiology;;   CHOLECYSTECTOMY     COLONOSCOPY     LOOP RECORDER IMPLANT  2021   ORIF WRIST FRACTURE Right 09/22/2020   Procedure: OPEN REDUCTION INTERNAL FIXATION (ORIF) WRIST FRACTURE;  Surgeon: Verner Mould, MD;  Location: [place];  Service: Orthopedics;  Laterality: Right;  33mn   TEE WITHOUT CARDIOVERSION N/A 07/05/2022   Procedure: TRANSESOPHAGEAL ECHOCARDIOGRAM (TEE);  Surgeon: AElouise Munroe MD;  [place]  Service: Cardiology;  Laterality: N/A;   Patient Active Problem List   Diagnosis Date Noted   Nonrheumatic mitral valve regurgitation 05/10/2022   [place] 05/[place]   Anemia, chronic disease 05/06/2022   Hyperlipidemia LDL goal <100 03/26/2022   Recurrent major depressive disorder, in partial remission ([facility] 11/15/2021   Need for shingles vaccine 11/15/2021   Visit for screening mammogram 11/15/2021   Need for vaccination  11/15/2021   GAD (generalized anxiety disorder) 11/15/2021   Stage 3a chronic kidney disease ([place] 11/15/2021   Trigeminal neuralgia    [place] ([place]    Irritable bowel disease    GERD (gastroesophageal reflux disease)    Hypertension    Gastroparesis    Degeneration of lumbar intervertebral disc 01/26/2020   Osteopenia 12/14/2015   Peptic ulcer disease 12/14/2015    ONSET DATE: referral 08/15/2022   REFERRING DIAG: I63.311 (ICD-10-CM) - Cerebrovascular accident (CVA) due to thrombosis of right middle cerebral artery (HCC)   THERAPY DIAG:  Cognitive communication deficit  Aphasia  Dysarthria and anarthria  Rationale for Evaluation and Treatment: Rehabilitation  SUBJECTIVE:   SUBJECTIVE STATEMENT: "I set some limits with everybody"    PAIN: Are you having pain? No   OBJECTIVE:   TODAY'S TREATMENT:  09-18-22: Education on using meta-cognition to aid in implementing self-care habits to aid in stroke recovery while being primary caregiver for mother. Education on impact stress and emotions can have on cognitive function with pt verbalizing strong agreement that for her, she notices a difference in processing, planning, organization, and verbal expression with reduced emotional or stress burden. Target verbal expression through moderately complex divergent naming task, with pt able to generate x26 items in given category. Following ID x3 categories and is able to categorize appropriate items into categories with mod-I. Update HEP  with language focused task. Plan to instruct and implement Semantic Feature Analysis in upcoming session. No paraphasias evidenced this date. Pt reports she believes she will need to return to work, SLP advises will work to target cognition and language in context of job performance duties to facilitate return to work.   09-14-22: Re-education provided regarding cognitive pacing and fatigue management and strategies to aid in improved abilities to focus on and  complete required or desired tasks. See handout. SLP A pt in ID factors which are cognitively draining vs fulfilling. Pt ID personal circumstances which have negative impact on cognitive functioning. SLP educated on meta cognitive strategy of re-framing to A in facilitation of brain breaks and pacing strategies as pt reports feeling guilt. Generated scripts pt can employ to be clear about boundaries to facilitate pacing/breaks. SLP educated on anomia, paraphasias, and rehabilitation of language s/p stroke per pt's questions. SLP employed visual aid to A in understanding of lexical representation within the brain. Plan to target language in structured tasks in upcoming session.   09-11-22: Missed earlier ST appointment due to written error in calendar. Pt c/o memory deficits, speech impairment ("My tongue feels like it turns sideways"), and word finding. Anomia evidenced x1, in which pt able to ID targeted work with additional time and cued use of description strategy. 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 Family providing some assistance with higher level cognitive tasks (I.e., setting up grocery delivery service). Pt frequently forgetting items on to-do and grocery lists. Educated recommended compensations and modifications to aid recall and attention for improved task completion. Discussed importance of cognitive breaks due to mental fatigue and need for self-advocacy with family members to receive increased assistance as needed. See patient instructions for additional details.   08-24-22: Education provided on evaluation results and SLP's recommendations. Collaborated with pt to formulate POC and goals. Pt verbalizes agreement with POC, all questions answered to satisfaction at conclusion of session. SLP advised to continue with HEP established for dysarthria by [facility] SLP.   PATIENT EDUCATION: Education details: see above Person educated: Patient Education  method: Customer service manager Education comprehension: verbalized understanding, returned demonstration, and needs further education   GOALS: Goals reviewed with patient? Yes  SHORT TERM GOALS: Target date: 10/05/2022  Pt will complete standardized cognitive assessment and/or patient reported outcome measure to assess need for cognitive interventions Baseline: Cognitive Function PROM=46 Goal status: MET  2.  Pt will demonstrate auditory processing strategies in structured activities with rare min-A Baseline:  Goal status: IN PROGRESS  3.  Pt will complete moderately complex structured language tasks with 80% accuracy given occasional min-A over 2 sessions Baseline:  Goal status: IN PROGRESS  4.  Pt will demonstrate over-articulation and pacing dysarthria strategies in moderately complex speech tasks with 80% accuracy given rare min-A Baseline:  Goal status: IN PROGRESS   LONG TERM GOALS: Target date: 11/02/2022  Pt will employ auditory comprehension strategies during 15 minute conversation in busy environment (e.g. therapy gym), resulting in ability to respond appropriately to comments and questions in conversation with rare min-A Baseline:  Goal status: IN PROGRESS  2.  Pt will complete moderately complex structured language tasks with 90% accuracy given rare min-A over 2 sessions Baseline:  Goal status: IN PROGRESS  3.  Pt will employ anomia compensations in 80% of opportunities, over 10 minute conversation, with rare min-A Baseline:  Goal status: IN PROGRESS  ASSESSMENT:  CLINICAL IMPRESSION: Patient is a 66 y.o. F  who was seen today for cognitive linguistic evaluation s/p stroke. Pt reporting ongoing memory, word finding, and dysarthria. Pt's goal is to return to work if able. Initiated education and instruction of memory, attention, and processing strategies for accurate completion of functional household tasks. Skilled ST is indicated to maximize cognition,  speech, and language, and facilitate potential return to work.  OBJECTIVE IMPAIRMENTS: include attention, memory, executive functioning, aphasia, and dysarthria. These impairments are limiting patient from return to work, ADLs/IADLs, and effectively communicating at home and in community. Factors affecting potential to achieve goals and functional outcome are  lack of support . Patient will benefit from skilled SLP services to address above impairments and improve overall function.  REHAB POTENTIAL: Good  PLAN:  SLP FREQUENCY: 2x/week  SLP DURATION: 10 weeks  PLANNED INTERVENTIONS: Language facilitation, Cueing hierachy, Cognitive reorganization, Internal/external aids, Oral motor exercises, Functional tasks, SLP instruction and feedback, Compensatory strategies, and Patient/family education    Su Monks, CCC-SLP 09/18/2022, 10:15 AM

## 2022-09-19 ENCOUNTER — Encounter: Payer: Self-pay | Admitting: Rheumatology

## 2022-09-19 ENCOUNTER — Ambulatory Visit: Payer: Medicare Other | Attending: Rheumatology | Admitting: Rheumatology

## 2022-09-19 VITALS — BP 173/79 | HR 67 | Resp 15 | Ht 62.0 in | Wt 150.8 lb

## 2022-09-19 DIAGNOSIS — F3341 Major depressive disorder, recurrent, in partial remission: Secondary | ICD-10-CM | POA: Diagnosis present

## 2022-09-19 DIAGNOSIS — I63311 Cerebral infarction due to thrombosis of right middle cerebral artery: Secondary | ICD-10-CM

## 2022-09-19 DIAGNOSIS — M25561 Pain in right knee: Secondary | ICD-10-CM | POA: Diagnosis present

## 2022-09-19 DIAGNOSIS — D638 Anemia in other chronic diseases classified elsewhere: Secondary | ICD-10-CM | POA: Diagnosis present

## 2022-09-19 DIAGNOSIS — M7061 Trochanteric bursitis, right hip: Secondary | ICD-10-CM

## 2022-09-19 DIAGNOSIS — I340 Nonrheumatic mitral (valve) insufficiency: Secondary | ICD-10-CM | POA: Diagnosis present

## 2022-09-19 DIAGNOSIS — Z8739 Personal history of other diseases of the musculoskeletal system and connective tissue: Secondary | ICD-10-CM | POA: Diagnosis present

## 2022-09-19 DIAGNOSIS — G500 Trigeminal neuralgia: Secondary | ICD-10-CM | POA: Diagnosis present

## 2022-09-19 DIAGNOSIS — Z79899 Other long term (current) drug therapy: Secondary | ICD-10-CM

## 2022-09-19 DIAGNOSIS — R7989 Other specified abnormal findings of blood chemistry: Secondary | ICD-10-CM

## 2022-09-19 DIAGNOSIS — G8929 Other chronic pain: Secondary | ICD-10-CM

## 2022-09-19 DIAGNOSIS — M5136 Other intervertebral disc degeneration, lumbar region: Secondary | ICD-10-CM | POA: Insufficient documentation

## 2022-09-19 DIAGNOSIS — H35389 Toxic maculopathy, unspecified eye: Secondary | ICD-10-CM | POA: Diagnosis not present

## 2022-09-19 DIAGNOSIS — N1831 Chronic kidney disease, stage 3a: Secondary | ICD-10-CM

## 2022-09-19 DIAGNOSIS — IMO0002 Reserved for concepts with insufficient information to code with codable children: Secondary | ICD-10-CM

## 2022-09-19 DIAGNOSIS — M329 Systemic lupus erythematosus, unspecified: Secondary | ICD-10-CM | POA: Diagnosis not present

## 2022-09-19 DIAGNOSIS — Z8719 Personal history of other diseases of the digestive system: Secondary | ICD-10-CM | POA: Diagnosis present

## 2022-09-19 DIAGNOSIS — M791 Myalgia, unspecified site: Secondary | ICD-10-CM

## 2022-09-19 DIAGNOSIS — E785 Hyperlipidemia, unspecified: Secondary | ICD-10-CM | POA: Diagnosis present

## 2022-09-19 DIAGNOSIS — T372X5A Adverse effect of antimalarials and drugs acting on other blood protozoa, initial encounter: Secondary | ICD-10-CM | POA: Diagnosis present

## 2022-09-19 DIAGNOSIS — F411 Generalized anxiety disorder: Secondary | ICD-10-CM

## 2022-09-19 DIAGNOSIS — R760 Raised antibody titer: Secondary | ICD-10-CM | POA: Diagnosis not present

## 2022-09-19 DIAGNOSIS — I10 Essential (primary) hypertension: Secondary | ICD-10-CM | POA: Diagnosis present

## 2022-09-19 DIAGNOSIS — I6350 Cerebral infarction due to unspecified occlusion or stenosis of unspecified cerebral artery: Secondary | ICD-10-CM

## 2022-09-19 DIAGNOSIS — K3184 Gastroparesis: Secondary | ICD-10-CM | POA: Diagnosis present

## 2022-09-19 DIAGNOSIS — M51369 Other intervertebral disc degeneration, lumbar region without mention of lumbar back pain or lower extremity pain: Secondary | ICD-10-CM

## 2022-09-19 DIAGNOSIS — K279 Peptic ulcer, site unspecified, unspecified as acute or chronic, without hemorrhage or perforation: Secondary | ICD-10-CM | POA: Diagnosis present

## 2022-09-19 DIAGNOSIS — M25562 Pain in left knee: Secondary | ICD-10-CM | POA: Diagnosis present

## 2022-09-19 NOTE — Patient Instructions (Signed)
Standing Labs We placed an order today for your standing lab work.   Please have your standing labs drawn in March  Please have your labs drawn 2 weeks prior to your appointment so that the provider can discuss your lab results at your appointment.  Please note that you may see your imaging and lab results in MyChart before we have reviewed them. We will contact you once all results are reviewed. Please allow our office up to 72 hours to thoroughly review all of the results before contacting the office for clarification of your results.  Lab hours are:   Monday through Thursday from 8:00 am -12:30 pm and 1:00 pm-5:00 pm and Friday from 8:00 am-12:00 pm.  Please be advised, all patients with office appointments requiring lab work will take precedent over walk-in lab work.   Labs are drawn by [facility]. Please bring your co-pay at the time of your lab draw.  You may receive a bill from [facility] for your lab work.  Please note if you are on Hydroxychloroquine and and an order has been placed for a Hydroxychloroquine level, you will need to have it drawn 4 hours or more after your last dose.  If you wish to have your labs drawn at another location, please call the office 24 hours in advance so we can fax the orders.  The office is located at [address], [place], [place] [address] No appointment is necessary.    If you have any questions regarding directions or hours of operation,  please call [phone].   As a reminder, please drink plenty of water prior to coming for your lab work. Thanks!

## 2022-09-20 ENCOUNTER — Ambulatory Visit: Payer: Medicare Other | Admitting: Speech Pathology

## 2022-09-20 DIAGNOSIS — R41841 Cognitive communication deficit: Secondary | ICD-10-CM | POA: Diagnosis not present

## 2022-09-20 DIAGNOSIS — R4701 Aphasia: Secondary | ICD-10-CM

## 2022-09-20 DIAGNOSIS — R471 Dysarthria and anarthria: Secondary | ICD-10-CM

## 2022-09-20 NOTE — Patient Instructions (Signed)
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 red flags in chart)  Communicate with nurse re: red flags for d/c  Multitasking (answering phones, documentation, A other nurses) Some patient care  Sustain, focused, and selective attention with external distractions Managing nerves and sensory stimulation

## 2022-09-20 NOTE — Therapy (Signed)
OUTPATIENT SPEECH LANGUAGE PATHOLOGY TREATMENT   Patient Name: Debbie Hatfield MRN: [id number] DOB:[birth year], 66 y.o., female Today's Date: 09/20/2022  PCP: Dr. Scarlette Calico REFERRING PROVIDER: Dr. Arlice Colt   End of Session - 09/20/22 1005     Visit Number 5    Number of Visits 17    Date for SLP Re-Evaluation 11/02/22    Authorization Type Medicare    Progress Note Due on Visit 10    SLP Start Time 1015    SLP Stop Time  1100    SLP Time Calculation (min) 45 min    Activity Tolerance Patient tolerated treatment well                Past Medical History:  Diagnosis Date   Closed fracture of right distal radius    Gastroparesis    GERD (gastroesophageal reflux disease)    Hypertension    Irritable bowel disease    Lupus ([place])    Major depression    Stroke [facility])    Trigeminal neuralgia    Past Surgical History:  Procedure Laterality Date   BUBBLE STUDY  07/05/2022   Procedure: BUBBLE STUDY;  Surgeon: Elouise Munroe, MD;  Location: [place];  Service: Cardiology;;   CHOLECYSTECTOMY     COLONOSCOPY     LOOP RECORDER IMPLANT  2021   ORIF WRIST FRACTURE Right 09/22/2020   Procedure: OPEN REDUCTION INTERNAL FIXATION (ORIF) WRIST FRACTURE;  Surgeon: Verner Mould, MD;  Location: [place];  Service: Orthopedics;  Laterality: Right;  61mn   TEE WITHOUT CARDIOVERSION N/A 07/05/2022   Procedure: TRANSESOPHAGEAL ECHOCARDIOGRAM (TEE);  Surgeon: AElouise Munroe MD;  [place]  Service: Cardiology;  Laterality: N/A;   Patient Active Problem List   Diagnosis Date Noted   Nonrheumatic mitral valve regurgitation 05/10/2022   [place]   Anemia, chronic disease 05/06/2022   Hyperlipidemia LDL goal <100 03/26/2022   Recurrent major depressive disorder, in partial remission ([place] 11/15/2021   Need for shingles vaccine 11/15/2021   Visit for screening mammogram 11/15/2021   Need for vaccination  11/15/2021   GAD (generalized anxiety disorder) 11/15/2021   Stage 3a chronic kidney disease ([place] 11/15/2021   Trigeminal neuralgia    [place]us ([place]    Irritable bowel disease    GERD (gastroesophageal reflux disease)    Hypertension    Gastroparesis    Degeneration of lumbar intervertebral disc 01/26/2020   Osteopenia 12/14/2015   Peptic ulcer disease 12/14/2015    ONSET DATE: referral 08/15/2022   REFERRING DIAG: I63.311 (ICD-10-CM) - Cerebrovascular accident (CVA) due to thrombosis of right middle cerebral artery (HCC)   THERAPY DIAG:  Cognitive communication deficit  Aphasia  Dysarthria and anarthria  Rationale for Evaluation and Treatment: Rehabilitation  SUBJECTIVE:   SUBJECTIVE STATEMENT: "good"    PAIN: Are you having pain? No   OBJECTIVE:   TODAY'S TREATMENT:  09-20-22: Pt reporting is planning to return to work. Verbalizes primary concern in return to work is being "sharp enough." With use of occasional questioning cues, pt generates x11 discrete skills she needs to effectively perform role previously employed in. Generated strategy of calling supervisor to assess need for pt in that role or potential other roles to facilitate decision on whether to return. Target skill of medication counseling as return to work skill with pt able to read her personal list of medications, dosage, instructions, and recall what for with 100% accuracy. Provides additional information she would  typically share with pts at this time such as reflux precautions. Pt pleased with performance. Will plan to target again with novel medication list. See instructions for complete list of skills ID during today's session. No paraphasias evidenced over 45 minute session. Discourse coherent and cohesive.   09-18-22: Education on using meta-cognition to aid in implementing self-care habits to aid in stroke recovery while being primary caregiver for mother. Education on impact stress and emotions can have  on cognitive function with pt verbalizing strong agreement that for her, she notices a difference in processing, planning, organization, and verbal expression with reduced emotional or stress burden. Target verbal expression through moderately complex divergent naming task, with pt able to generate x26 items in given category. Following ID x3 categories and is able to categorize appropriate items into categories with mod-I. Update HEP with language focused task. Plan to instruct and implement Semantic Feature Analysis in upcoming session. No paraphasias evidenced this date. Pt reports she believes she will need to return to work, SLP advises will work to target cognition and language in context of job performance duties to facilitate return to work.   09-14-22: Re-education provided regarding cognitive pacing and fatigue management and strategies to aid in improved abilities to focus on and complete required or desired tasks. See handout. SLP A pt in ID factors which are cognitively draining vs fulfilling. Pt ID personal circumstances which have negative impact on cognitive functioning. SLP educated on meta cognitive strategy of re-framing to A in facilitation of brain breaks and pacing strategies as pt reports feeling guilt. Generated scripts pt can employ to be clear about boundaries to facilitate pacing/breaks. SLP educated on anomia, paraphasias, and rehabilitation of language s/p stroke per pt's questions. SLP employed visual aid to A in understanding of lexical representation within the brain. Plan to target language in structured tasks in upcoming session.   09-11-22: Missed earlier ST appointment due to written error in calendar. Pt c/o memory deficits, speech impairment ("My tongue feels like it turns sideways"), and word finding. Anomia evidenced x1, in which pt able to ID targeted work with additional time and cued use of description strategy. 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 Family providing some assistance with higher level cognitive tasks (I.e., setting up grocery delivery service). Pt frequently forgetting items on to-do and grocery lists. Educated recommended compensations and modifications to aid recall and attention for improved task completion. Discussed importance of cognitive breaks due to mental fatigue and need for self-advocacy with family members to receive increased assistance as needed. See patient instructions for additional details.   08-24-22: Education provided on evaluation results and SLP's recommendations. Collaborated with pt to formulate POC and goals. Pt verbalizes agreement with POC, all questions answered to satisfaction at conclusion of session. SLP advised to continue with HEP established for dysarthria by [facility] SLP.   PATIENT EDUCATION: Education details: see above Person educated: Patient Education method: Customer service manager Education comprehension: verbalized understanding, returned demonstration, and needs further education   GOALS: Goals reviewed with patient? Yes  SHORT TERM GOALS: Target date: 10/05/2022  Pt will complete standardized cognitive assessment and/or patient reported outcome measure to assess need for cognitive interventions Baseline: Cognitive Function PROM=46 Goal status: MET  2.  Pt will demonstrate auditory processing strategies in structured activities with rare min-A Baseline:  Goal status: IN PROGRESS  3.  Pt will complete moderately complex structured language tasks with 80% accuracy given occasional min-A over 2 sessions  Baseline:  Goal status: IN PROGRESS  4.  Pt will demonstrate over-articulation and pacing dysarthria strategies in moderately complex speech tasks with 80% accuracy given rare min-A Baseline:  Goal status: IN PROGRESS   LONG TERM GOALS: Target date: 11/02/2022  Pt will employ auditory comprehension strategies during 15 minute conversation in  busy environment (e.g. therapy gym), resulting in ability to respond appropriately to comments and questions in conversation with rare min-A Baseline:  Goal status: IN PROGRESS  2.  Pt will complete moderately complex structured language tasks with 90% accuracy given rare min-A over 2 sessions Baseline:  Goal status: IN PROGRESS  3.  Pt will employ anomia compensations in 80% of opportunities, over 10 minute conversation, with rare min-A Baseline:  Goal status: IN PROGRESS  ASSESSMENT:  CLINICAL IMPRESSION: Patient is a 66 y.o. F who was seen today for cognitive linguistic evaluation s/p stroke. Pt reporting ongoing memory, word finding, and dysarthria. Pt's goal is to return to work if able. Initiated education and instruction of memory, attention, and processing strategies for accurate completion of functional household tasks. Skilled ST is indicated to maximize cognition, speech, and language, and facilitate potential return to work.  OBJECTIVE IMPAIRMENTS: include attention, memory, executive functioning, aphasia, and dysarthria. These impairments are limiting patient from return to work, ADLs/IADLs, and effectively communicating at home and in community. Factors affecting potential to achieve goals and functional outcome are  lack of support . Patient will benefit from skilled SLP services to address above impairments and improve overall function.  REHAB POTENTIAL: Good  PLAN:  SLP FREQUENCY: 2x/week  SLP DURATION: 10 weeks  PLANNED INTERVENTIONS: Language facilitation, Cueing hierachy, Cognitive reorganization, Internal/external aids, Oral motor exercises, Functional tasks, SLP instruction and feedback, Compensatory strategies, and Patient/family education    Su Monks, CCC-SLP 09/20/2022, 10:05 AM

## 2022-09-21 ENCOUNTER — Telehealth: Payer: Self-pay | Admitting: Hematology and Oncology

## 2022-09-21 NOTE — Telephone Encounter (Signed)
Spoke with patient confirming new patient appointment 12/29

## 2022-09-25 ENCOUNTER — Encounter: Payer: Medicare Other | Admitting: Speech Pathology

## 2022-10-02 ENCOUNTER — Ambulatory Visit: Payer: Medicare Other | Admitting: Speech Pathology

## 2022-10-04 ENCOUNTER — Ambulatory Visit: Payer: Medicare Other

## 2022-10-10 ENCOUNTER — Ambulatory Visit: Payer: Medicare Other | Admitting: Speech Pathology

## 2022-10-10 DIAGNOSIS — R4701 Aphasia: Secondary | ICD-10-CM

## 2022-10-10 DIAGNOSIS — R41841 Cognitive communication deficit: Secondary | ICD-10-CM

## 2022-10-10 DIAGNOSIS — R471 Dysarthria and anarthria: Secondary | ICD-10-CM

## 2022-10-10 NOTE — Therapy (Signed)
OUTPATIENT SPEECH LANGUAGE PATHOLOGY TREATMENT   Patient Name: Debbie Hatfield MRN: [id number] DOB:1956-07-05, 66 y.o., female Today's Date: 10/10/2022  PCP: Dr. Scarlette Calico REFERRING PROVIDER: Dr. Arlice Colt   End of Session - 10/10/22 1102     Visit Number 6    Number of Visits 17    Date for SLP Re-Evaluation 11/02/22    Authorization Type Medicare    Progress Note Due on Visit 10    SLP Start Time 1102    SLP Stop Time  1145    SLP Time Calculation (min) 43 min    Activity Tolerance Patient tolerated treatment well                Past Medical History:  Diagnosis Date   Closed fracture of right distal radius    Gastroparesis    GERD (gastroesophageal reflux disease)    Hypertension    Irritable bowel disease    Lupus ([place])    Major depression    Stroke [facility])    Trigeminal neuralgia    Past Surgical History:  Procedure Laterality Date   BUBBLE STUDY  07/05/2022   Procedure: BUBBLE STUDY;  Surgeon: Elouise Munroe, MD;  Location: [place];  Service: Cardiology;;   CHOLECYSTECTOMY     COLONOSCOPY     LOOP RECORDER IMPLANT  2021   ORIF WRIST FRACTURE Right 09/22/2020   Procedure: OPEN REDUCTION INTERNAL FIXATION (ORIF) WRIST FRACTURE;  Surgeon: Verner Mould, MD;  Location: [place];  Service: Orthopedics;  Laterality: Right;  58mn   TEE WITHOUT CARDIOVERSION N/A 07/05/2022   Procedure: TRANSESOPHAGEAL ECHOCARDIOGRAM (TEE);  Surgeon: AElouise Munroe MD;  [place]  Service: Cardiology;  Laterality: N/A;   Patient Active Problem List   Diagnosis Date Noted   Nonrheumatic mitral valve regurgitation 05/10/2022   [place] (cerebrum) ([place] 05/07[place]20[place]   Anemia, chronic disease 05/06/2022   Hyperlipidemia LDL goal <100 03/26/2022   Recurrent major depressive disorder, in partial remission ([place] 11/15/2021   Need for shingles vaccine 11/15/2021   Visit for screening mammogram 11/15/2021   Need for vaccination  11/15/2021   GAD (generalized anxiety disorder) 11/15/2021   Stage 3a chronic kidney disease ([place] 11/15/2021   Trigeminal neuralgia    [place]    Irritable bowel disease    GERD (gastroesophageal reflux disease)    Hypertension    Gastroparesis    Degeneration of lumbar intervertebral disc 01/26/2020   Osteopenia 12/14/2015   Peptic ulcer disease 12/14/2015    ONSET DATE: referral 08/15/2022   REFERRING DIAG: I63.311 (ICD-10-CM) - Cerebrovascular accident (CVA) due to thrombosis of right middle cerebral artery (HCC)   THERAPY DIAG:  Cognitive communication deficit  Aphasia  Dysarthria and anarthria  Rationale for Evaluation and Treatment: Rehabilitation  SUBJECTIVE:   SUBJECTIVE STATEMENT: "hanging in there"    PAIN: Are you having pain? No   OBJECTIVE:   TODAY'S TREATMENT:  10-10-22: SLP led pt through retrospective analysis of recent events, which were complex and challenging, to determine strategies which were beneficial or should be implemented to aid in future improved competencies. Beneficial strategies were in setting clear boundaries, implemented group text messages, asking for A from family members, pacing. In future, pt determines employment of writing things down may be beneficial and setting boundaries sooner would be helpful. 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 Overall going well with rare instances  of forgetting "details," such as diet instructions for a specific medication or checking for access to equipment. SLP suggests use of external aid- d/c checklist to aid in comprehensive and complete d/c instructions. Use of checklist can aid in making usre all important areas are discussed, be used to write down questions she would need answers to and be memory aid should she get off track d/t pt and family questions. Pt to generate list to practice with in  upcoming session. SLP suggests modification to grocery list use: check list before getting into checkout line. Pt going to grocery store upon leaving, will refer to list and report back if was benefiical for getting all desired items.   09-20-22: Pt reporting is planning to return to work. Verbalizes primary concern in return to work is being "sharp enough." With use of occasional questioning cues, pt generates x11 discrete skills she needs to effectively perform role previously employed in. Generated strategy of calling supervisor to assess need for pt in that role or potential other roles to facilitate decision on whether to return. Target skill of medication counseling as return to work skill with pt able to read her personal list of medications, dosage, instructions, and recall what for with 100% accuracy. Provides additional information she would typically share with pts at this time such as reflux precautions. Pt pleased with performance. Will plan to target again with novel medication list. See instructions for complete list of skills ID during today's session. No paraphasias evidenced over 45 minute session. Discourse coherent and cohesive.   09-18-22: Education on using meta-cognition to aid in implementing self-care habits to aid in stroke recovery while being primary caregiver for mother. Education on impact stress and emotions can have on cognitive function with pt verbalizing strong agreement that for her, she notices a difference in processing, planning, organization, and verbal expression with reduced emotional or stress burden. Target verbal expression through moderately complex divergent naming task, with pt able to generate x26 items in given category. Following ID x3 categories and is able to categorize appropriate items into categories with mod-I. Update HEP with language focused task. Plan to instruct and implement Semantic Feature Analysis in upcoming session. No paraphasias evidenced this  date. Pt reports she believes she will need to return to work, SLP advises will work to target cognition and language in context of job performance duties to facilitate return to work.   09-14-22: Re-education provided regarding cognitive pacing and fatigue management and strategies to aid in improved abilities to focus on and complete required or desired tasks. See handout. SLP A pt in ID factors which are cognitively draining vs fulfilling. Pt ID personal circumstances which have negative impact on cognitive functioning. SLP educated on meta cognitive strategy of re-framing to A in facilitation of brain breaks and pacing strategies as pt reports feeling guilt. Generated scripts pt can employ to be clear about boundaries to facilitate pacing/breaks. SLP educated on anomia, paraphasias, and rehabilitation of language s/p stroke per pt's questions. SLP employed visual aid to A in understanding of lexical representation within the brain. Plan to target language in structured tasks in upcoming session.   09-11-22: Missed earlier ST appointment due to written error in calendar. Pt c/o memory deficits, speech impairment ("My tongue feels like it turns sideways"), and word finding. Anomia evidenced x1, in which pt able to ID targeted work with additional time and cued use of description strategy. 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 Family  providing some assistance with higher level cognitive tasks (I.e., setting up grocery delivery service). Pt frequently forgetting items on to-do and grocery lists. Educated recommended compensations and modifications to aid recall and attention for improved task completion. Discussed importance of cognitive breaks due to mental fatigue and need for self-advocacy with family members to receive increased assistance as needed. See patient instructions for additional details.   08-24-22: Education provided on evaluation results and SLP's  recommendations. Collaborated with pt to formulate POC and goals. Pt verbalizes agreement with POC, all questions answered to satisfaction at conclusion of session. SLP advised to continue with HEP established for dysarthria by [facility] SLP.   PATIENT EDUCATION: Education details: see above Person educated: Patient Education method: Customer service manager Education comprehension: verbalized understanding, returned demonstration, and needs further education   GOALS: Goals reviewed with patient? Yes  SHORT TERM GOALS: Target date: 10/05/2022  Pt will complete standardized cognitive assessment and/or patient reported outcome measure to assess need for cognitive interventions Baseline: Cognitive Function PROM=46 Goal status: MET  2.  Pt will demonstrate auditory processing strategies in structured activities with rare min-A Baseline:  Goal status: IN PROGRESS  3.  Pt will complete moderately complex structured language tasks with 80% accuracy given occasional min-A over 2 sessions Baseline:  Goal status: IN PROGRESS  4.  Pt will demonstrate over-articulation and pacing dysarthria strategies in moderately complex speech tasks with 80% accuracy given rare min-A Baseline:  Goal status: IN PROGRESS   LONG TERM GOALS: Target date: 11/02/2022  Pt will employ auditory comprehension strategies during 15 minute conversation in busy environment (e.g. therapy gym), resulting in ability to respond appropriately to comments and questions in conversation with rare min-A Baseline:  Goal status: IN PROGRESS  2.  Pt will complete moderately complex structured language tasks with 90% accuracy given rare min-A over 2 sessions Baseline:  Goal status: IN PROGRESS  3.  Pt will employ anomia compensations in 80% of opportunities, over 10 minute conversation, with rare min-A Baseline:  Goal status: IN PROGRESS  ASSESSMENT:  CLINICAL IMPRESSION: Patient is a 66 y.o. F who was seen today for  cognitive linguistic evaluation s/p stroke. Pt reporting ongoing memory, word finding, and dysarthria. Pt's goal is to return to work if able. Initiated education and instruction of memory, attention, and processing strategies for accurate completion of functional household tasks. Skilled ST is indicated to maximize cognition, speech, and language, and facilitate potential return to work.  OBJECTIVE IMPAIRMENTS: include attention, memory, executive functioning, aphasia, and dysarthria. These impairments are limiting patient from return to work, ADLs/IADLs, and effectively communicating at home and in community. Factors affecting potential to achieve goals and functional outcome are  lack of support . Patient will benefit from skilled SLP services to address above impairments and improve overall function.  REHAB POTENTIAL: Good  PLAN:  SLP FREQUENCY: 2x/week  SLP DURATION: 10 weeks  PLANNED INTERVENTIONS: Language facilitation, Cueing hierachy, Cognitive reorganization, Internal/external aids, Oral motor exercises, Functional tasks, SLP instruction and feedback, Compensatory strategies, and Patient/family education    Su Monks, CCC-SLP 10/10/2022, 11:02 AM

## 2022-10-11 ENCOUNTER — Ambulatory Visit: Payer: Medicare Other | Admitting: Internal Medicine

## 2022-10-12 ENCOUNTER — Ambulatory Visit: Payer: Medicare Other | Admitting: Speech Pathology

## 2022-10-12 ENCOUNTER — Inpatient Hospital Stay: Payer: Medicare Other

## 2022-10-12 ENCOUNTER — Inpatient Hospital Stay: Payer: Medicare Other | Attending: Hematology and Oncology | Admitting: Hematology and Oncology

## 2022-10-16 ENCOUNTER — Ambulatory Visit: Payer: Medicare Other | Attending: Internal Medicine

## 2022-10-16 DIAGNOSIS — R55 Syncope and collapse: Secondary | ICD-10-CM

## 2022-10-16 LAB — CUP PACEART REMOTE DEVICE CHECK
Date Time Interrogation Session: 20240101230650
Implantable Pulse Generator Implant Date: 20220728

## 2022-10-16 NOTE — Therapy (Signed)
OUTPATIENT SPEECH LANGUAGE PATHOLOGY TREATMENT   Patient Name: Debbie Hatfield MRN: [id number] DOB:[birth year], 67 y.o., female Today's Date: 10/17/2022  PCP: Dr. Scarlette Calico REFERRING PROVIDER: Dr. Arlice Colt   End of Session - 10/17/22 1017     Visit Number 7    Number of Visits 17    Date for SLP Re-Evaluation 11/02/22    Authorization Type Medicare    Progress Note Due on Visit 10    SLP Start Time 1018    SLP Stop Time  1100    SLP Time Calculation (min) 42 min    Activity Tolerance Patient tolerated treatment well                 Past Medical History:  Diagnosis Date   Closed fracture of right distal radius    Gastroparesis    GERD (gastroesophageal reflux disease)    Hypertension    Irritable bowel disease    Lupus ([place])    Major depression    Stroke [facility])    Trigeminal neuralgia    Past Surgical History:  Procedure Laterality Date   BUBBLE STUDY  07/05/2022   Procedure: BUBBLE STUDY;  Surgeon: Elouise Munroe, MD;  Location: [place];  Service: Cardiology;;   CHOLECYSTECTOMY     COLONOSCOPY     LOOP RECORDER IMPLANT  2021   ORIF WRIST FRACTURE Right 09/22/2020   Procedure: OPEN REDUCTION INTERNAL FIXATION (ORIF) WRIST FRACTURE;  Surgeon: Verner Mould, MD;  Location: [place];  Service: Orthopedics;  Laterality: Right;  14mn   TEE WITHOUT CARDIOVERSION N/A 07/05/2022   Procedure: TRANSESOPHAGEAL ECHOCARDIOGRAM (TEE);  Surgeon: AElouise Munroe MD;  [place]  Service: Cardiology;  Laterality: N/A;   Patient Active Problem List   Diagnosis Date Noted   Nonrheumatic mitral valve regurgitation 05/10/2022   [place] 05/[place]   Anemia, chronic disease 05/06/2022   Hyperlipidemia LDL goal <100 03/26/2022   Recurrent major depressive disorder, in partial remission ([place] 11/15/2021   Need for shingles vaccine 11/15/2021   Visit for screening mammogram 11/15/2021   Need for vaccination  11/15/2021   GAD (generalized anxiety disorder) 11/15/2021   [place] kidney [place] 11/15/20[place]   Trigeminal neuralgia    Lupus ([place]    Irritable bowel disease    GERD (gastroesophageal reflux disease)    Hypertension    Gastroparesis    Degeneration of lumbar intervertebral disc 01/26/2020   Osteopenia 12/14/2015   Peptic ulcer disease 12/14/2015    ONSET DATE: referral 08/15/2022   REFERRING DIAG: I63.311 (ICD-10-CM) - Cerebrovascular accident (CVA) due to thrombosis of right middle cerebral artery (HCC)   THERAPY DIAG:  Cognitive communication deficit  Dysarthria and anarthria  Aphasia  Rationale for Evaluation and Treatment: Rehabilitation  SUBJECTIVE:   SUBJECTIVE STATEMENT: "my mom is still sick"    PAIN: Are you having pain? No   OBJECTIVE:   TODAY'S TREATMENT:  10-17-22: TOmmieendorsing ongoing difficulties with decision making. Upon questioning, reveals challenges lie in ability to process and consider numerous pieces of information at one time or quickly. SLP advises on strategies to aid in decision making with emphasis this date on getting information out of head via writing to faciliate evaluation of all information. With writing, pt can also add information as she considers complex problems without forgetting information already thought of. Modeled use of pro/con list for numerous potential options for current life decision pt is struggling to make. Encouraged pt  to consider consequences for potential options made and potential solutions for cons if able. These should be added to overall written evaluation of complex problem. Pt to attempt strategy use for making decision regarding discussed personal event before next session and will bring back.   10-10-22: SLP led pt through retrospective analysis of recent events, which were complex and challenging, to determine strategies which were beneficial or should be implemented to aid in future improved  competencies. Beneficial strategies were in setting clear boundaries, implemented group text messages, asking for A from family members, pacing. In future, pt determines employment of writing things down may be beneficial and setting boundaries sooner would be helpful. 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 Overall going well with rare instances of forgetting "details," such as diet instructions for a specific medication or checking for access to equipment. SLP suggests use of external aid- d/c checklist to aid in comprehensive and complete d/c instructions. Use of checklist can aid in making usre all important areas are discussed, be used to write down questions she would need answers to and be memory aid should she get off track d/t pt and family questions. Pt to generate list to practice with in upcoming session. SLP suggests modification to grocery list use: check list before getting into checkout line. Pt going to grocery store upon leaving, will refer to list and report back if was benefiical for getting all desired items.   PATIENT EDUCATION: Education details: see above Person educated: Patient Education method: Customer service manager Education comprehension: verbalized understanding, returned demonstration, and needs further education   GOALS: Goals reviewed with patient? Yes  SHORT TERM GOALS: Target date: 10/05/2022  Pt will complete standardized cognitive assessment and/or patient reported outcome measure to assess need for cognitive interventions Baseline: Cognitive Function PROM=46 Goal status: MET  2.  Pt will demonstrate auditory processing strategies in structured activities with rare min-A Baseline:  Goal status: IN PROGRESS  3.  Pt will complete moderately complex structured language tasks with 80% accuracy given occasional min-A over 2 sessions Baseline:  Goal  status: IN PROGRESS  4.  Pt will demonstrate over-articulation and pacing dysarthria strategies in moderately complex speech tasks with 80% accuracy given rare min-A Baseline:  Goal status: IN PROGRESS   LONG TERM GOALS: Target date: 11/02/2022  Pt will employ auditory comprehension strategies during 15 minute conversation in busy environment (e.g. therapy gym), resulting in ability to respond appropriately to comments and questions in conversation with rare min-A Baseline:  Goal status: IN PROGRESS  2.  Pt will complete moderately complex structured language tasks with 90% accuracy given rare min-A over 2 sessions Baseline:  Goal status: IN PROGRESS  3.  Pt will employ anomia compensations in 80% of opportunities, over 10 minute conversation, with rare min-A Baseline:  Goal status: IN PROGRESS  ASSESSMENT:  CLINICAL IMPRESSION: Patient is a 67 y.o. F who was seen today for cognitive linguistic evaluation s/p stroke. Pt reporting ongoing memory, word finding, and dysarthria. Pt's goal is to return to work if able. Initiated education and instruction of memory, attention, and processing strategies for accurate completion of functional household tasks. Skilled ST is indicated to maximize cognition, speech, and language, and facilitate potential return to work.  OBJECTIVE IMPAIRMENTS: include attention, memory, executive functioning, aphasia, and dysarthria. These impairments are limiting patient from return to work, ADLs/IADLs, and effectively communicating at home and in community. Factors  affecting potential to achieve goals and functional outcome are  lack of support . Patient will benefit from skilled SLP services to address above impairments and improve overall function.  REHAB POTENTIAL: Good  PLAN:  SLP FREQUENCY: 2x/week  SLP DURATION: 10 weeks  PLANNED INTERVENTIONS: Language facilitation, Cueing hierachy, Cognitive reorganization, Internal/external aids, Oral motor  exercises, Functional tasks, SLP instruction and feedback, Compensatory strategies, and Patient/family education    Su Monks, CCC-SLP 10/17/2022, 10:18 AM

## 2022-10-17 ENCOUNTER — Ambulatory Visit: Payer: Medicare Other | Attending: Neurology | Admitting: Speech Pathology

## 2022-10-17 DIAGNOSIS — R4701 Aphasia: Secondary | ICD-10-CM | POA: Diagnosis present

## 2022-10-17 DIAGNOSIS — R41841 Cognitive communication deficit: Secondary | ICD-10-CM

## 2022-10-17 DIAGNOSIS — R471 Dysarthria and anarthria: Secondary | ICD-10-CM

## 2022-10-18 ENCOUNTER — Encounter: Payer: Self-pay | Admitting: Rheumatology

## 2022-10-18 DIAGNOSIS — Z79899 Other long term (current) drug therapy: Secondary | ICD-10-CM

## 2022-10-18 DIAGNOSIS — M329 Systemic lupus erythematosus, unspecified: Secondary | ICD-10-CM

## 2022-10-18 NOTE — Telephone Encounter (Signed)
Does the patient need another referral, if so please send the referral otherwise she can call the hematology office to schedule an appointment.  Patient believes that she is having a flare she may come in to get labs which should include CBC, CMP, sed rate, C3-C4, dsDNA.

## 2022-10-19 ENCOUNTER — Other Ambulatory Visit: Payer: Self-pay | Admitting: *Deleted

## 2022-10-19 ENCOUNTER — Ambulatory Visit: Payer: Medicare Other | Admitting: Speech Pathology

## 2022-10-19 DIAGNOSIS — Z79899 Other long term (current) drug therapy: Secondary | ICD-10-CM

## 2022-10-19 DIAGNOSIS — R471 Dysarthria and anarthria: Secondary | ICD-10-CM

## 2022-10-19 DIAGNOSIS — R41841 Cognitive communication deficit: Secondary | ICD-10-CM | POA: Diagnosis not present

## 2022-10-19 DIAGNOSIS — M329 Systemic lupus erythematosus, unspecified: Secondary | ICD-10-CM

## 2022-10-19 DIAGNOSIS — R4701 Aphasia: Secondary | ICD-10-CM

## 2022-10-19 NOTE — Therapy (Signed)
OUTPATIENT SPEECH LANGUAGE PATHOLOGY TREATMENT   Patient Name: Debbie Hatfield MRN: [id number] DOB:December 18, 1955, 67 y.o., female Today's Date: 10/19/2022  PCP: Dr. Scarlette Calico REFERRING PROVIDER: Dr. Arlice Colt   End of Session - 10/19/22 1011     Visit Number 8    Number of Visits 17    Date for SLP Re-Evaluation 11/02/22    Authorization Type Medicare    Progress Note Due on Visit 10    SLP Start Time 1015    SLP Stop Time  1100    SLP Time Calculation (min) 45 min    Activity Tolerance Patient tolerated treatment well                 Past Medical History:  Diagnosis Date   Closed fracture of right distal radius    Gastroparesis    GERD (gastroesophageal reflux disease)    Hypertension    Irritable bowel disease    Lupus ([place])    Major depression    Stroke [facility])    Trigeminal neuralgia    Past Surgical History:  Procedure Laterality Date   BUBBLE STUDY  07/05/2022   Procedure: BUBBLE STUDY;  Surgeon: Elouise Munroe, MD;  Location: [place];  Service: Cardiology;;   CHOLECYSTECTOMY     COLONOSCOPY     LOOP RECORDER IMPLANT  2021   ORIF WRIST FRACTURE Right 09/22/2020   Procedure: OPEN REDUCTION INTERNAL FIXATION (ORIF) WRIST FRACTURE;  Surgeon: Verner Mould, MD;  Location: [place];  Service: Orthopedics;  Laterality: Right;  56min   TEE WITHOUT CARDIOVERSION N/A 07/05/2022   Procedure: TRANSESOPHAGEAL ECHOCARDIOGRAM (TEE);  Surgeon: Elouise Munroe, MD;  Location: [place];  Service: Cardiology;  Laterality: N/A;   Patient Active Problem List   Diagnosis Date Noted   Nonrheumatic mitral valve regurgitation 05/10/2022   Stroke (cerebrum) ([place]) 05/07/2022   Anemia, chronic disease 05/06/2022   Hyperlipidemia LDL goal <100 03/26/2022   Recurrent major depressive disorder, in partial remission ([place]) 11/15/2021   Need for shingles vaccine 11/15/2021   Visit for screening mammogram 11/15/2021   Need for vaccination  11/15/2021   GAD (generalized anxiety disorder) 11/15/2021   Stage 3a chronic kidney disease ([place]) 11/15/2021   Trigeminal neuralgia    Lupus ([place])    Irritable bowel disease    GERD (gastroesophageal reflux disease)    Hypertension    Gastroparesis    Degeneration of lumbar intervertebral disc 01/26/2020   Osteopenia 12/14/2015   Peptic ulcer disease 12/14/2015    ONSET DATE: referral 08/15/2022   REFERRING DIAG: I63.311 (ICD-10-CM) - Cerebrovascular accident (CVA) due to thrombosis of right middle cerebral artery (HCC)   THERAPY DIAG:  Cognitive communication deficit  Dysarthria and anarthria  Aphasia  Rationale for Evaluation and Treatment: Rehabilitation  SUBJECTIVE:   SUBJECTIVE STATEMENT: "I'm having a lupus flair, I think it's just accumulation of stress"   PAIN: Are you having pain? No   OBJECTIVE:   TODAY'S TREATMENT:  10-19-22: Pt presents with home work completed, demonstrating good insight into strengths and weaknesses and how each would be affected by choice in complex decision making problem. Reporting was helpful to get out of head onto paper but is still challenging decision to make. Per pt, numerous significant life circumstances at this time which are impacting brain fog and cognitive fatigue. Demonstrates increasing awareness of factors which negatively impact overall cognitive functioning and ability to implement strategies and compensations to cope with onoging challenges. Pt has requested  A from sister to manage circumstances and reports to incorporating pacing and delegation, and external aids. Throughout complex discussion of ~20 minutes, pt evidencing no overt anomia/dysnomia. 100% intelligible with speech observed to be within gross normal limits.    10-17-22: Rhaelyn endorsing ongoing difficulties with decision making. Upon questioning, reveals challenges lie in ability to process and consider numerous pieces of information at one time or quickly. SLP  advises on strategies to aid in decision making with emphasis this date on getting information out of head via writing to faciliate evaluation of all information. With writing, pt can also add information as she considers complex problems without forgetting information already thought of. Modeled use of pro/con list for numerous potential options for current life decision pt is struggling to make. Encouraged pt to consider consequences for potential options made and potential solutions for cons if able. These should be added to overall written evaluation of complex problem. Pt to attempt strategy use for making decision regarding discussed personal event before next session and will bring back.   10-10-22: SLP led pt through retrospective analysis of recent events, which were complex and challenging, to determine strategies which were beneficial or should be implemented to aid in future improved competencies. Beneficial strategies were in setting clear boundaries, implemented group text messages, asking for A from family members, pacing. In future, pt determines employment of writing things down may be beneficial and setting boundaries sooner would be helpful. 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 Overall going well with rare instances of forgetting "details," such as diet instructions for a specific medication or checking for access to equipment. SLP suggests use of external aid- d/c checklist to aid in comprehensive and complete d/c instructions. Use of checklist can aid in making usre all important areas are discussed, be used to write down questions she would need answers to and be memory aid should she get off track d/t pt and family questions. Pt to generate list to practice with in upcoming session. SLP suggests modification to grocery list use: check list before getting into checkout line. Pt going to  grocery store upon leaving, will refer to list and report back if was benefiical for getting all desired items.   PATIENT EDUCATION: Education details: see above Person educated: Patient Education method: Customer service manager Education comprehension: verbalized understanding, returned demonstration, and needs further education   GOALS: Goals reviewed with patient? Yes  SHORT TERM GOALS: Target date: 10/05/2022  Pt will complete standardized cognitive assessment and/or patient reported outcome measure to assess need for cognitive interventions Baseline: Cognitive Function PROM=46 Goal status: MET  2.  Pt will demonstrate auditory processing strategies in structured activities with rare min-A Baseline:  Goal status: MET  3.  Pt will complete moderately complex structured language tasks with 80% accuracy given occasional min-A over 2 sessions Baseline:  Goal status: MET  4.  Pt will demonstrate over-articulation and pacing dysarthria strategies in moderately complex speech tasks with 80% accuracy given rare min-A Baseline:  Goal status: MET   LONG TERM GOALS: Target date: 11/02/2022  Pt will employ auditory comprehension strategies during 15 minute conversation in busy environment (e.g. therapy gym), resulting in ability to respond appropriately to comments and questions in conversation with rare min-A Baseline:  Goal status: IN PROGRESS  2.  Pt will complete moderately complex structured language tasks with 90% accuracy given rare min-A over 2 sessions Baseline:  Goal status: IN PROGRESS  3.  Pt will employ anomia compensations in 80% of opportunities, over 10 minute conversation, with rare min-A Baseline:  Goal status: IN PROGRESS  ASSESSMENT:  CLINICAL IMPRESSION: Patient is a 67 y.o. F who was seen today for cognitive linguistic evaluation s/p stroke. Pt reporting ongoing memory, word finding, and dysarthria. Pt's goal is to return to work if able. Initiated  education and instruction of memory, attention, and processing strategies for accurate completion of functional household tasks. Skilled ST is indicated to maximize cognition, speech, and language, and facilitate potential return to work.  OBJECTIVE IMPAIRMENTS: include attention, memory, executive functioning, aphasia, and dysarthria. These impairments are limiting patient from return to work, ADLs/IADLs, and effectively communicating at home and in community. Factors affecting potential to achieve goals and functional outcome are  lack of support . Patient will benefit from skilled SLP services to address above impairments and improve overall function.  REHAB POTENTIAL: Good  PLAN:  SLP FREQUENCY: 2x/week  SLP DURATION: 10 weeks  PLANNED INTERVENTIONS: Language facilitation, Cueing hierachy, Cognitive reorganization, Internal/external aids, Oral motor exercises, Functional tasks, SLP instruction and feedback, Compensatory strategies, and Patient/family education    Maia Breslow, CCC-SLP 10/19/2022, 11:49 AM

## 2022-10-20 LAB — CBC WITH DIFFERENTIAL/PLATELET
Absolute Monocytes: 596 cells/uL (ref 200–950)
Basophils Absolute: 8 cells/uL (ref 0–200)
Basophils Relative: 0.2 %
Eosinophils Absolute: 108 cells/uL (ref 15–500)
Eosinophils Relative: 2.7 %
HCT: 35.2 % (ref 35.0–45.0)
Hemoglobin: 11.6 g/dL — ABNORMAL LOW (ref 11.7–15.5)
Lymphs Abs: 1404 cells/uL (ref 850–3900)
MCH: 29.4 pg (ref 27.0–33.0)
MCHC: 33 g/dL (ref 32.0–36.0)
MCV: 89.3 fL (ref 80.0–100.0)
MPV: 10.6 fL (ref 7.5–12.5)
Monocytes Relative: 14.9 %
Neutro Abs: 1884 cells/uL (ref 1500–7800)
Neutrophils Relative %: 47.1 %
Platelets: 311 10*3/uL (ref 140–400)
RBC: 3.94 10*6/uL (ref 3.80–5.10)
RDW: 11.7 % (ref 11.0–15.0)
Total Lymphocyte: 35.1 %
WBC: 4 10*3/uL (ref 3.8–10.8)

## 2022-10-20 LAB — COMPLETE METABOLIC PANEL WITH GFR
AG Ratio: 1.5 (calc) (ref 1.0–2.5)
ALT: 16 U/L (ref 6–29)
AST: 26 U/L (ref 10–35)
Albumin: 4.2 g/dL (ref 3.6–5.1)
Alkaline phosphatase (APISO): 95 U/L (ref 37–153)
BUN: 15 mg/dL (ref 7–25)
CO2: 30 mmol/L (ref 20–32)
Calcium: 9.3 mg/dL (ref 8.6–10.4)
Chloride: 103 mmol/L (ref 98–110)
Creat: 1.01 mg/dL (ref 0.50–1.05)
Globulin: 2.8 g/dL (calc) (ref 1.9–3.7)
Glucose, Bld: 103 mg/dL — ABNORMAL HIGH (ref 65–99)
Potassium: 4.1 mmol/L (ref 3.5–5.3)
Sodium: 140 mmol/L (ref 135–146)
Total Bilirubin: 0.5 mg/dL (ref 0.2–1.2)
Total Protein: 7 g/dL (ref 6.1–8.1)
eGFR: 61 mL/min/{1.73_m2} (ref >=60)

## 2022-10-20 LAB — SEDIMENTATION RATE: Sed Rate: 22 mm/h (ref 0–30)

## 2022-10-20 LAB — ANTI-DNA ANTIBODY, DOUBLE-STRANDED: ds DNA Ab: <1 IU/mL

## 2022-10-20 LAB — C3 AND C4
C3 Complement: 137 mg/dL (ref 83–193)
C4 Complement: 24 mg/dL (ref 15–57)

## 2022-10-22 ENCOUNTER — Ambulatory Visit (INDEPENDENT_AMBULATORY_CARE_PROVIDER_SITE_OTHER): Payer: Medicare Other | Admitting: Internal Medicine

## 2022-10-22 ENCOUNTER — Encounter: Payer: Self-pay | Admitting: Internal Medicine

## 2022-10-22 VITALS — BP 136/68 | HR 89 | Temp 98.3°F | Resp 16 | Ht 62.0 in | Wt 151.0 lb

## 2022-10-22 DIAGNOSIS — I63311 Cerebral infarction due to thrombosis of right middle cerebral artery: Secondary | ICD-10-CM | POA: Diagnosis not present

## 2022-10-22 DIAGNOSIS — H00015 Hordeolum externum left lower eyelid: Secondary | ICD-10-CM

## 2022-10-22 DIAGNOSIS — F411 Generalized anxiety disorder: Secondary | ICD-10-CM | POA: Diagnosis not present

## 2022-10-22 DIAGNOSIS — N1831 Chronic kidney disease, stage 3a: Secondary | ICD-10-CM | POA: Diagnosis not present

## 2022-10-22 DIAGNOSIS — G500 Trigeminal neuralgia: Secondary | ICD-10-CM | POA: Diagnosis not present

## 2022-10-22 DIAGNOSIS — I10 Essential (primary) hypertension: Secondary | ICD-10-CM | POA: Diagnosis not present

## 2022-10-22 MED ORDER — CARBAMAZEPINE ER 300 MG PO CP12: 300.0000 mg | ORAL_CAPSULE | Freq: Every day | ORAL | 0 refills | Status: DC

## 2022-10-22 MED ORDER — DOXYCYCLINE HYCLATE 100 MG PO TABS: 100.0000 mg | ORAL_TABLET | Freq: Two times a day (BID) | ORAL | 0 refills | Status: AC

## 2022-10-22 NOTE — Progress Notes (Signed)
Sed rate and complements are normal.  Double-stranded DNA is negative.  CBC is normal except low hemoglobin.  CMP is normal.  Labs do not indicate an autoimmune disease flare.

## 2022-10-22 NOTE — Patient Instructions (Signed)
Stye ?A stye, also known as a hordeolum, is a bump that forms on an eyelid. It may look like a pimple next to the eyelash. A stye can form inside the eyelid (internal stye) or outside the eyelid (external stye). A stye can cause redness, swelling, and pain on the eyelid. ?Styes are very common. Anyone can get them at any age. They usually occur in just one eye at a time, but you may have more than one in either eye. ?What are the causes? ?A stye is caused by an infection. The infection is almost always caused by bacteria called Staphylococcus aureus. This is a common type of bacteria that lives on the skin. ?An internal stye may result from an infected oil-producing gland inside the eyelid. An external stye may be caused by an infection at the base of the eyelash (hair follicle). ?What increases the risk? ?You are more likely to develop a stye if: ?You have had a stye before. ?You have any of these conditions: ?Red, itchy, inflamed eyelids (blepharitis). ?A skin condition such as seborrheic dermatitis or rosacea. ?High fat levels in your blood (lipids). ?Dry eyes. ?What are the signs or symptoms? ?The most common symptom of a stye is eyelid pain. Internal styes are more painful than external styes. Other symptoms may include: ?Painful swelling of your eyelid. ?A scratchy feeling in your eye. ?Tearing and redness of your eye. ?A pimple-like bump on the edge of the eyelid. ?Pus draining from the stye. ?How is this diagnosed? ?Your health care provider may be able to diagnose a stye just by examining your eye. The health care provider may also check to make sure: ?You do not have a fever or other signs of a more serious infection. ?The infection has not spread to other parts of your eye or areas around your eye. ?How is this treated? ?Most styes will clear up in a few days without treatment or with warm compresses applied to the area. You may need to use antibiotic drops or ointment to treat an infection. Sometimes,  steroid drops or ointment are used in addition to antibiotics. ?In some cases, your health care provider may give you a small steroid injection in the eyelid. ?If your stye does not heal with routine treatment, your health care provider may drain pus from the stye using a thin blade or needle. This may be done if the stye is large, causing a lot of pain, or affecting your vision. ?Follow these instructions at home: ?Take over-the-counter and prescription medicines only as told by your health care provider. This includes eye drops or ointments. ?If you were prescribed an antibiotic medicine, steroid medicine, or both, apply or use them as told by your health care provider. Do not stop using the medicine even if your condition improves. ?Apply a warm, wet cloth (warm compress) to your eye for 5-10 minutes, 4 to 6 times a day. ?Clean the affected eyelid as directed by your health care provider. ?Do not wear contact lenses or eye makeup until your stye has healed and your health care provider says that it is safe. ?Do not try to pop or drain the stye. ?Do not rub your eye. ?Contact a health care provider if: ?You have chills or a fever. ?Your stye does not go away after several days. ?Your stye affects your vision. ?Your eyeball becomes swollen, red, or painful. ?Get help right away if: ?You have pain when moving your eye around. ?Summary ?A stye is a bump that forms   on an eyelid. It may look like a pimple next to the eyelash. ?A stye can form inside the eyelid (internal stye) or outside the eyelid (external stye). A stye can cause redness, swelling, and pain on the eyelid. ?Your health care provider may be able to diagnose a stye just by examining your eye. ?Apply a warm, wet cloth (warm compress) to your eye for 5-10 minutes, 4 to 6 times a day. ?This information is not intended to replace advice given to you by your health care provider. Make sure you discuss any questions you have with your health care  provider. ?Document Revised: 12/07/2020 Document Reviewed: 12/07/2020 ?Elsevier Patient Education ? 2023 [employer] ? ?

## 2022-10-22 NOTE — Progress Notes (Signed)
Subjective:  Patient ID: Debbie Hatfield, female    DOB: 27-Apr-1956  Age: 67 y.o. 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 She thinks she has vulvar lesions currently.  She complains of a 3 day history of pain, erythema, swelling over her left lower eyelid.  Outpatient Medications Prior to Visit  Medication Sig Dispense Refill   acetaminophen (TYLENOL) 325 MG tablet Take 650 mg by mouth as needed.     acyclovir (ZOVIRAX) 400 MG tablet Take 1 tablet (400 mg total) by mouth daily. 90 tablet 1   amLODipine (NORVASC) 5 MG tablet Take 1 tablet (5 mg total) by mouth daily. 90 tablet 1   aspirin EC 81 MG tablet Take 1 tablet (81 mg total) by mouth daily. Swallow whole. (Patient taking differently: Take 81 mg by mouth at bedtime. Swallow whole.) 30 tablet 12   baclofen (LIORESAL) 10 MG tablet Take 1 tablet (10 mg total) by mouth 3 (three) times daily. (Patient taking differently: Take 10 mg by mouth as needed.) 90 each 5   clopidogrel (PLAVIX) 75 MG tablet Take 1 tablet (75 mg total) by mouth daily. 30 tablet 11   gabapentin (NEURONTIN) 300 MG capsule Take 600 mg by mouth 2 (two) times daily.     hydrochlorothiazide (MICROZIDE) 12.5 MG capsule Take 1 capsule (12.5 mg total) by mouth daily. 30 capsule 11   meclizine (ANTIVERT) 25 MG tablet Take 25 mg by mouth daily as needed for dizziness.     pantoprazole (PROTONIX) 40 MG tablet Take 1 tablet (40 mg total) by mouth daily. 90 tablet 3   rosuvastatin (CRESTOR) 20 MG tablet Take 1 tablet (20 mg total) by mouth daily. 90 tablet 1   telmisartan-hydrochlorothiazide (MICARDIS HCT) 40-12.5 MG tablet TAKE 1 TABLET BY MOUTH DAILY 90 tablet 0   tizanidine (ZANAFLEX) 2 MG capsule Take 4 mg by mouth at bedtime.     traMADol (ULTRAM) 50 MG tablet Take 50 mg by mouth 2 (two) times daily.     venlafaxine XR (EFFEXOR-XR) 37.5 MG 24 hr capsule TAKE 2 CAPSULES(75 MG) BY MOUTH DAILY  WITH BREAKFAST (Patient taking differently: Take 75 mg by mouth at bedtime.) 180 capsule 1   carbamazepine (CARBATROL) 300 MG 12 hr capsule TAKE 1 CAPSULE(300 MG) BY MOUTH DAILY (Patient taking differently: Take 300 mg by mouth at bedtime.) 90 capsule 0   hydrOXYzine (ATARAX) 10 MG tablet Take 1 tablet (10 mg total) by mouth every 8 (eight) hours as needed. (Patient taking differently: Take 10 mg by mouth every 8 (eight) hours as needed for anxiety.) 270 tablet 0   No facility-administered medications prior to visit.    ROS Review of Systems  Constitutional: Negative.  Negative for chills, diaphoresis, fatigue and fever.  HENT: Negative.    Eyes:  Negative for photophobia, pain, discharge, redness, itching and visual disturbance.  Respiratory: Negative.  Negative for chest tightness, shortness of breath and wheezing.   Cardiovascular:  Negative for chest pain, palpitations and leg swelling.  Gastrointestinal:  Negative for abdominal pain, diarrhea, nausea and vomiting.  Genitourinary: Negative.  Negative for difficulty urinating.  Musculoskeletal: Negative.  Negative for arthralgias.  Skin: Negative.   Neurological:  Negative for dizziness, weakness and light-headedness.  Hematological:  Negative for adenopathy. Does not bruise/bleed easily.  Psychiatric/Behavioral:  Negative for confusion and sleep disturbance. The patient is nervous/anxious.     Objective:  BP 136/68 (BP Location: Right Arm, Patient Position: Sitting, Cuff Size: Large)   Pulse 89   Temp 98.3 F (36.8 C) (Oral)   Resp 16   Ht 5\' 2"  (1.575 m)   Wt 151 lb (68.5 kg)   SpO2 99%   BMI 27.62 kg/m   BP Readings from Last 3 Encounters:  10/22/22 136/68  09/19/22 (!) 173/79  08/08/22 (!) 152/66    Wt Readings from Last 3 Encounters:  10/24/22 151 lb (68.5 kg)  10/22/22 151 lb (68.5 kg)  09/19/22 150 lb 12.8 oz (68.4 kg)    Physical Exam Vitals reviewed. Exam conducted with a chaperone present (Shirron).   HENT:     Nose: Nose normal.     Mouth/Throat:     Mouth: Mucous membranes are moist.  Eyes:     General: No scleral icterus.    Conjunctiva/sclera: Conjunctivae normal.  Cardiovascular:     Rate and Rhythm: Normal rate and regular rhythm.     Heart sounds: Murmur heard.     Systolic murmur is present with a grade of 2/6.     No diastolic murmur is present.     No friction rub. No gallop.  Pulmonary:     Breath sounds: No stridor. No wheezing, rhonchi or rales.  Abdominal:     General: Abdomen is flat.     Palpations: There is no mass.     Tenderness: There is no abdominal tenderness. There is no guarding.     Hernia: No hernia is present. There is no hernia in the left inguinal area or right inguinal area.  Genitourinary:    Exam position: Supine.     Pubic Area: No rash.      Labia:        Right: No rash, tenderness, lesion or injury.        Left: No rash, tenderness, lesion or injury.      Urethra: No prolapse, urethral pain, urethral swelling or urethral lesion.     Rectum: External hemorrhoid present.     Comments: There are no lesions Musculoskeletal:     Cervical back: Neck supple.     Right lower leg: No edema.     Left lower leg: No edema.  Lymphadenopathy:     Cervical: No cervical adenopathy.     Lower Body: No right inguinal adenopathy. No left inguinal adenopathy.  Skin:    General: Skin is warm and dry.     Findings: No rash.  Neurological:     Mental Status: She is alert. Mental status is at baseline.  Psychiatric:        Mood and Affect: Mood normal.        Behavior: Behavior normal.     Lab Results  Component Value Date   WBC 4.0 10/19/2022   HGB 11.6 (L) 10/19/2022   HCT 35.2 10/19/2022   PLT 311 10/19/2022   GLUCOSE 103 (H) 10/19/2022   CHOL 238 (H) 05/08/2022   TRIG 119 05/08/2022   HDL 109 05/08/2022   LDLCALC 105 (H) 05/08/2022   ALT 16 10/19/2022   AST 26 10/19/2022   NA 140 10/19/2022   K 4.1 10/19/2022   CL 103 10/19/2022    CREATININE 1.01 10/19/2022   BUN 15 10/19/2022   CO2 30 10/19/2022   TSH 1.45 11/15/2021   INR 0.9 05/07/2022   HGBA1C 5.7 (H) 05/08/2022    ECHO TEE  Result Date: 07/05/2022    TRANSESOPHOGEAL ECHO REPORT  Patient Name:   Debbie Hatfield Date of Exam: 07/05/2022 Medical Rec #:  [id number]      Height:       62.0 in Accession #:    [id number]     Weight:       145.1 lb Date of Birth:  03-26-1956       BSA:          1.668 m Patient Age:    66 years       BP:           164/72 mmHg Patient Gender: F              HR:           90 bpm. Exam Location:  Inpatient Procedure: Transesophageal Echo, Color Doppler, Cardiac Doppler and Saline            Contrast Bubble Study Indications:     Stroke i63.9  History:         Patient has prior history of Echocardiogram examinations, most                  recent 05/07/2022. Risk Factors:Hypertension and Dyslipidemia.  Sonographer:     Irving Burton Senior RDCS Referring Phys:  Norman Herrlich, J Diagnosing Phys: Weston Brass MD PROCEDURE: After discussion of the risks and benefits of a TEE, an informed consent was obtained from the patient. TEE procedure time was 16 minutes. The transesophogeal probe was passed without difficulty through the esophogus of the patient. Imaged were obtained with the patient in a left lateral decubitus position. Local oropharyngeal anesthetic was provided with Cetacaine. Sedation performed by different physician. The patient was monitored while under deep sedation. Anesthestetic sedation was provided intravenously by Anesthesiology: 335mg  of Propofol. Image quality was excellent. The patient's vital signs; including heart rate, blood pressure, and oxygen saturation; remained stable throughout the procedure. The patient developed no complications during the procedure. IMPRESSIONS  1. Left ventricular ejection fraction, by estimation, is 65 to 70%. The left ventricle has normal function. The left ventricle has no regional wall motion abnormalities.  2.  Right ventricular systolic function is normal. The right ventricular size is normal.  3. Left atrial size was mildly dilated. No left atrial/left atrial appendage thrombus was detected. The LAA emptying velocity was 95 cm/s.  4. A small pericardial effusion is present.  5. The mitral valve is normal in structure. Trivial mitral valve regurgitation. No evidence of mitral stenosis.  6. The aortic valve is tricuspid. There is mild calcification of the aortic valve. Aortic valve regurgitation is trivial. Mild aortic valve stenosis. Aortic valve mean gradient measures 12.0 mmHg.  7. There is mild (Grade II) atheroma plaque involving the aortic arch and descending aorta.  8. Evidence of atrial level shunting detected by color flow Doppler. Agitated saline contrast bubble study was positive with shunting observed within 3-6 cardiac cycles suggestive of interatrial shunt. There is a small patent foramen ovale with bidirectional shunting across atrial septum. FINDINGS  Left Ventricle: Left ventricular ejection fraction, by estimation, is 65 to 70%. The left ventricle has normal function. The left ventricle has no regional wall motion abnormalities. The left ventricular internal cavity size was normal in size. Right Ventricle: The right ventricular size is normal. No increase in right ventricular wall thickness. Right ventricular systolic function is normal. Left Atrium: Left atrial size was mildly dilated. No left atrial/left atrial appendage thrombus was detected. The LAA emptying velocity was 95 cm/s. Right Atrium: Right atrial  size was normal in size. Pericardium: A small pericardial effusion is present. Mitral Valve: The mitral valve is normal in structure. Trivial mitral valve regurgitation. No evidence of mitral valve stenosis. Tricuspid Valve: The tricuspid valve is normal in structure. Tricuspid valve regurgitation is trivial. No evidence of tricuspid stenosis. Aortic Valve: The aortic valve is tricuspid. There is  mild calcification of the aortic valve. Aortic valve regurgitation is trivial. Mild aortic stenosis is present. Aortic valve mean gradient measures 12.0 mmHg. Aortic valve peak gradient measures 17.2 mmHg. Aortic valve area, by VTI measures 1.83 cm. Pulmonic Valve: The pulmonic valve was normal in structure. Pulmonic valve regurgitation is trivial. No evidence of pulmonic stenosis. Aorta: The aortic root and ascending aorta are structurally normal, with no evidence of dilitation. There is mild (Grade II) atheroma plaque involving the aortic arch and descending aorta. IAS/Shunts: There is redundancy of the interatrial septum. Evidence of atrial level shunting detected by color flow Doppler. Agitated saline contrast was given intravenously to evaluate for intracardiac shunting. Agitated saline contrast bubble study was  positive with shunting observed within 3-6 cardiac cycles suggestive of interatrial shunt. A small patent foramen ovale is detected with bidirectional shunting across atrial septum.  LEFT VENTRICLE PLAX 2D LVOT diam:     2.00 cm LV SV:         78 LV SV Index:   47 LVOT Area:     3.14 cm  AORTIC VALVE AV Area (Vmax):    1.60 cm AV Area (Vmean):   1.70 cm AV Area (VTI):     1.83 cm AV Vmax:           207.50 cm/s AV Vmean:          156.000 cm/s AV VTI:            0.423 m AV Peak Grad:      17.2 mmHg AV Mean Grad:      12.0 mmHg LVOT Vmax:         106.00 cm/s LVOT Vmean:        84.600 cm/s LVOT VTI:          0.247 m LVOT/AV VTI ratio: 0.58  AORTA Ao Root diam: 3.00 cm Ao Asc diam:  3.00 cm  SHUNTS Systemic VTI:  0.25 m Systemic Diam: 2.00 cm Weston Brass MD Electronically signed by Weston Brass MD Signature Date/Time: 07/05/2022/1:28:03 PM    Final    Estimated Creatinine Clearance: 49.7 mL/min (by C-G formula based on SCr of 1.01 mg/dL).   Assessment & Plan:   Debbie Hatfield was seen today for hypertension.  Diagnoses and all orders for this visit:  Primary hypertension- Her BP is adequately  well controlled.  Trigeminal neuralgia -     carbamazepine (CARBATROL) 300 MG 12 hr capsule; Take 1 capsule (300 mg total) by mouth at bedtime.  GAD (generalized anxiety disorder) -     hydrOXYzine (ATARAX) 10 MG tablet; Take 1 tablet (10 mg total) by mouth every 8 (eight) hours as needed for anxiety.  Stage 3a chronic kidney disease (HCC)- Renal fxn is stable.  Cerebrovascular accident (CVA) due to thrombosis of right middle cerebral artery (HCC) - Will continue disability.  Hordeolum externum of left lower eyelid -     doxycycline (VIBRA-TABS) 100 MG tablet; Take 1 tablet (100 mg total) by mouth 2 (two) times daily for 7 days.   I have changed Debbie Ober. Hatfield's carbamazepine and hydrOXYzine. I am also having her start on doxycycline. Additionally, I  am having her maintain her traMADol, gabapentin, acetaminophen, aspirin EC, hydrochlorothiazide, venlafaxine XR, baclofen, meclizine, amLODipine, pantoprazole, clopidogrel, rosuvastatin, acyclovir, tizanidine, and telmisartan-hydrochlorothiazide.  Meds ordered this encounter  Medications   carbamazepine (CARBATROL) 300 MG 12 hr capsule    Sig: Take 1 capsule (300 mg total) by mouth at bedtime.    Dispense:  90 capsule    Refill:  0   doxycycline (VIBRA-TABS) 100 MG tablet    Sig: Take 1 tablet (100 mg total) by mouth 2 (two) times daily for 7 days.    Dispense:  14 tablet    Refill:  0   hydrOXYzine (ATARAX) 10 MG tablet    Sig: Take 1 tablet (10 mg total) by mouth every 8 (eight) hours as needed for anxiety.    Dispense:  270 tablet    Refill:  0     Follow-up: Return in about 3 months (around 01/21/2023).  Debbie Calico, MD

## 2022-10-23 MED ORDER — HYDROXYZINE HCL 10 MG PO TABS: 10.0000 mg | ORAL_TABLET | Freq: Three times a day (TID) | ORAL | 0 refills | Status: DC | PRN

## 2022-10-23 NOTE — Progress Notes (Signed)
Carelink Summary Report / Loop Recorder 

## 2022-10-24 ENCOUNTER — Ambulatory Visit (INDEPENDENT_AMBULATORY_CARE_PROVIDER_SITE_OTHER): Payer: Medicare Other

## 2022-10-24 VITALS — Ht 62.0 in | Wt 151.0 lb

## 2022-10-24 DIAGNOSIS — Z0000 Encounter for general adult medical examination without abnormal findings: Secondary | ICD-10-CM

## 2022-10-24 NOTE — Progress Notes (Signed)
Subjective:   Debbie Hatfield is a 67 y.o. female who presents for an Initial Medicare Annual Wellness Visit.  I connected with  Debbie Hatfield on 10/24/22 by a audio enabled telemedicine application and verified that I am speaking with the correct person using two identifiers.  Patient Location: Home  Provider Location: Home Office  I discussed the limitations of evaluation and management by telemedicine. The patient expressed understanding and agreed to proceed.  Review of Systems     Cardiac Risk Factors include: advanced age (>62men, >56 women);dyslipidemia;hypertension     Objective:    Today's Vitals   10/24/22 1508  Weight: 151 lb (68.5 kg)  Height: 5\' 2"  (1.575 m)   Body mass index is 27.62 kg/m.     10/24/2022    3:11 PM 08/24/2022   10:27 AM 07/05/2022   10:38 AM 05/07/2022   11:51 AM 05/07/2022   10:07 AM 01/07/2021    1:10 PM 01/07/2021   11:47 AM  Advanced Directives  Does Patient Have a Medical Advance Directive? Yes Yes Yes Yes No No No;Yes  Type of Advance Directive Living will;Healthcare Power of 01/09/2021 Power of [place];Living will Healthcare Power of [place];Living will     Does patient want to make changes to medical advance directive? No - Patient declined No - Patient declined  No - Patient declined     Copy of Healthcare Power of Attorney in Chart? No - copy requested  No - copy requested No - copy requested       Current Medications (verified) Outpatient Encounter Medications as of 10/24/2022  Medication Sig   acetaminophen (TYLENOL) 325 MG tablet Take 650 mg by mouth as needed.   acyclovir (ZOVIRAX) 400 MG tablet Take 1 tablet (400 mg total) by mouth daily.   amLODipine (NORVASC) 5 MG tablet Take 1 tablet (5 mg total) by mouth daily.   aspirin EC 81 MG tablet Take 1 tablet (81 mg total) by mouth daily. Swallow whole. (Patient taking differently: Take 81 mg by mouth at bedtime. Swallow whole.)   baclofen (LIORESAL) 10 MG tablet Take  1 tablet (10 mg total) by mouth 3 (three) times daily. (Patient taking differently: Take 10 mg by mouth as needed.)   carbamazepine (CARBATROL) 300 MG 12 hr capsule Take 1 capsule (300 mg total) by mouth at bedtime.   clopidogrel (PLAVIX) 75 MG tablet Take 1 tablet (75 mg total) by mouth daily.   doxycycline (VIBRA-TABS) 100 MG tablet Take 1 tablet (100 mg total) by mouth 2 (two) times daily for 7 days.   gabapentin (NEURONTIN) 300 MG capsule Take 600 mg by mouth 2 (two) times daily.   hydrochlorothiazide (MICROZIDE) 12.5 MG capsule Take 1 capsule (12.5 mg total) by mouth daily.   hydrOXYzine (ATARAX) 10 MG tablet Take 1 tablet (10 mg total) by mouth every 8 (eight) hours as needed for anxiety.   meclizine (ANTIVERT) 25 MG tablet Take 25 mg by mouth daily as needed for dizziness.   pantoprazole (PROTONIX) 40 MG tablet Take 1 tablet (40 mg total) by mouth daily.   rosuvastatin (CRESTOR) 20 MG tablet Take 1 tablet (20 mg total) by mouth daily.   telmisartan-hydrochlorothiazide (MICARDIS HCT) 40-12.5 MG tablet TAKE 1 TABLET BY MOUTH DAILY   tizanidine (ZANAFLEX) 2 MG capsule Take 4 mg by mouth at bedtime.   traMADol (ULTRAM) 50 MG tablet Take 50 mg by mouth 2 (two) times daily.   venlafaxine XR (EFFEXOR-XR) 37.5 MG 24 hr capsule  TAKE 2 CAPSULES(75 MG) BY MOUTH DAILY WITH BREAKFAST (Patient taking differently: Take 75 mg by mouth at bedtime.)   [DISCONTINUED] hydrOXYzine (ATARAX) 10 MG tablet Take 1 tablet (10 mg total) by mouth every 8 (eight) hours as needed. (Patient taking differently: Take 10 mg by mouth every 8 (eight) hours as needed for anxiety.)   No facility-administered encounter medications on file as of 10/24/2022.    Allergies (verified) Plaquenil [hydroxychloroquine], Sulfa antibiotics, and Penicillins   History: Past Medical History:  Diagnosis Date   Closed fracture of right distal radius    Gastroparesis    GERD (gastroesophageal reflux disease)    Hypertension    Irritable  bowel disease    Lupus (HCC)    Major depression    Stroke [facility])    Trigeminal neuralgia    Past Surgical History:  Procedure Laterality Date   BUBBLE STUDY  07/05/2022   Procedure: BUBBLE STUDY;  Surgeon: Parke Poisson, MD;  Location: [facility] ENDOSCOPY;  Service: Cardiology;;   CHOLECYSTECTOMY     COLONOSCOPY     LOOP RECORDER IMPLANT  2021   ORIF WRIST FRACTURE Right 09/22/2020   Procedure: OPEN REDUCTION INTERNAL FIXATION (ORIF) WRIST FRACTURE;  Surgeon: Ernest Mallick, MD;  Location: [facility];  Service: Orthopedics;  Laterality: Right;    TEE WITHOUT CARDIOVERSION N/A 07/05/2022   Procedure: TRANSESOPHAGEAL ECHOCARDIOGRAM (TEE);  Surgeon: Parke Poisson, MD;  Location: [facility] ENDOSCOPY;  Service: Cardiology;  Laterality: N/A;   Family History  Problem Relation Age of Onset   Heart block Mother        Pacemaker   Hypertension Mother    Depression Mother    Gout Mother    Macular degeneration Mother    Aneurysm Mother    Alcoholism Father    Congestive Heart Failure Maternal Grandmother    Diabetes Maternal Grandmother    Congestive Heart Failure Maternal Grandfather    Colon cancer Maternal Grandfather    Stroke Paternal Grandfather    Hypertension Paternal Grandfather    Social History   Socioeconomic History   Marital status: Widowed    Spouse name: Not on file   Number of children: 0   Years of education: Not on file   Highest education level: Bachelor's degree (e.g., BA, AB, BS)  Occupational History    Comment: RN ortho/neuro at [employer]  Tobacco Use   Smoking status: Never    Passive exposure: Never   Smokeless tobacco: Never  Vaping Use   Vaping Use: Never used  Substance and Sexual Activity   Alcohol use: Yes    Comment: social   Drug use: Never   Sexual activity: Not Currently    Birth control/protection: Post-menopausal  Other Topics Concern   Not on file  Social History Narrative   06/13/22 living with her mother    Social Determinants of Health   Financial Resource Strain: Low Risk  (10/24/2022)   Overall Financial Resource Strain (CARDIA)    Difficulty of Paying Living Expenses: Not hard at all  Food Insecurity: No Food Insecurity (10/24/2022)   Hunger Vital Sign    Worried About Running Out of Food in the Last Year: Never true    Ran Out of Food in the Last Year: Never true  Transportation Needs: No Transportation Needs (10/24/2022)   PRAPARE - Administrator, Civil Service (Medical): No    Lack of Transportation (Non-Medical): No  Physical Activity: Inactive (10/24/2022)   Exercise Vital  Sign    Days of Exercise per Week: 0 days    Minutes of Exercise per Session: 0 min  Stress: No Stress Concern Present (10/24/2022)   [employer] of Occupational Health - Occupational Stress Questionnaire    Feeling of Stress : Only a little  Social Connections: Moderately Integrated (10/24/2022)   Social Connection and Isolation Panel [NHANES]    Frequency of Communication with Friends and Family: More than three times a week    Frequency of Social Gatherings with Friends and Family: Three times a week    Attends Religious Services: More than 4 times per year    Active Member of Clubs or Organizations: Yes    Attends Banker Meetings: More than 4 times per year    Marital Status: Widowed    Tobacco Counseling Counseling given: Not Answered   Clinical Intake:  Pre-visit preparation completed: Yes  Pain : No/denies pain  Diabetes: No  How often do you need to have someone help you when you read instructions, pamphlets, or other written materials from your doctor or pharmacy?: 1 - Never  Diabetic?No   Interpreter Needed?: No  Information entered by :: Kandis Fantasia LPN   Activities of Daily Living    10/24/2022    3:11 PM 05/07/2022   11:51 AM  In your present state of health, do you have any difficulty performing the following activities:  Hearing? 0 0   Vision? 0 0  Difficulty concentrating or making decisions? 0 0  Walking or climbing stairs? 0 0  Dressing or bathing? 0 0  Doing errands, shopping? 0 0  Preparing Food and eating ? N   Using the Toilet? N   In the past six months, have you accidently leaked urine? N   Do you have problems with loss of bowel control? N   Managing your Medications? N   Managing your Finances? N   Housekeeping or managing your Housekeeping? N     Patient Care Team: Etta Grandchild, MD as PCP - General (Internal Medicine)  Indicate any recent Medical Services you may have received from other than [facility] providers in the past year (date may be approximate).     Assessment:   This is a routine wellness examination for Myia.  Hearing/Vision screen Hearing Screening - Comments:: Denies hearing difficulties  Vision Screening - Comments::  up to date with routine eye exams with Dr. Lelan Pons    Dietary issues and exercise activities discussed: Current Exercise Habits: The patient does not participate in regular exercise at present   Goals Addressed             This Visit's Progress    Regain Independence         Depression Screen    10/24/2022    3:10 PM 10/22/2022   10:18 AM 05/10/2022    2:04 PM 03/26/2022    9:58 AM 11/15/2021   11:12 AM  PHQ 2/9 Scores  PHQ - 2 Score 0 0 4 1 0  PHQ- 9 Score 8 8 16 6 2     Fall Risk    10/24/2022    3:09 PM 05/10/2022    2:05 PM 05/10/2022    2:04 PM  Fall Risk   Falls in the past year? 0 1 0  Number falls in past yr: 0 1   Injury with Fall? 0 0   Risk for fall due to : Impaired mobility;Impaired balance/gait    Follow up Falls evaluation completed;Education provided;Falls  prevention discussed      FALL RISK PREVENTION PERTAINING TO THE HOME:  Any stairs in or around the home? No  If so, are there any without handrails? No  Home free of loose throw rugs in walkways, pet beds, electrical cords, etc? Yes  Adequate lighting in your home to reduce  risk of falls? Yes   ASSISTIVE DEVICES UTILIZED TO PREVENT FALLS:  Life alert? No  Use of a cane, walker or w/c? Yes  Grab bars in the bathroom? Yes  Shower chair or bench in shower? No  Elevated toilet seat or a handicapped toilet? Yes   TIMED UP AND GO:  Was the test performed? No . Telephonic visit   Cognitive Function:        10/24/2022    3:11 PM  6CIT Screen  What Year? 0 points  What month? 0 points  What time? 0 points  Count back from 20 0 points  Months in reverse 0 points  Repeat phrase 0 points  Total Score 0 points    Immunizations Immunization History  Administered Date(s) Administered   Influenza,inj,Quad PF,6-35 Mos 06/17/2019   Influenza-Unspecified 09/02/2020, 07/27/2021, 07/15/2022   Moderna SARS-COV2 Booster Vaccination 09/02/2020   Moderna Sars-Covid-2 Vaccination 10/23/2019, 11/20/2019   PNEUMOCOCCAL CONJUGATE-20 11/15/2021   Tdap 03/28/2014, 12/29/2020    TDAP status: Up to date  Flu Vaccine status: Up to date  Pneumococcal vaccine status: Up to date  Covid-19 vaccine status: Information provided on how to obtain vaccines.   Qualifies for Shingles Vaccine? Yes   Zostavax completed No   Shingrix Completed?: No.    Education has been provided regarding the importance of this vaccine. Patient has been advised to call insurance company to determine out of pocket expense if they have not yet received this vaccine. Advised may also receive vaccine at local pharmacy or Health Dept. Verbalized acceptance and understanding.  Screening Tests Health Maintenance  Topic Date Due   COVID-19 Vaccine (3 - Moderna risk series) 11/07/2022 (Originally 09/30/2020)   Zoster Vaccines- Shingrix (1 of 2) 11/10/2022 (Originally 10/18/1974)   DEXA SCAN  04/14/2023 (Originally 10/17/2021)   COLONOSCOPY (Pts 45-55yrs Insurance coverage will need to be confirmed)  04/14/2023 (Originally 10/17/2001)   Medicare Annual Wellness ([place])  10/25/2023   MAMMOGRAM  12/27/2023    DTaP/Tdap/Td (3 - Td or Tdap) 12/30/2030   Pneumonia Vaccine 79+ Years old  Completed   INFLUENZA VACCINE  Completed   Hepatitis C Screening  Completed   HPV VACCINES  Aged Out    Health Maintenance  There are no preventive care reminders to display for this patient.   Colorectal cancer status:  Patient would like to postpone at this time   Mammogram status: Completed 12/26/21. Repeat every year  Bone Density status:  Patient would like to postpone at this time   Lung Cancer Screening: (Low Dose CT Chest recommended if Age 17-80 years, 30 pack-year currently smoking OR have quit w/in 15years.) does not qualify.   Lung Cancer Screening Referral: n/a   Additional Screening:  Hepatitis C Screening: does qualify; Completed 06/22/22  Vision Screening: Recommended annual ophthalmology exams for early detection of glaucoma and other disorders of the eye. Is the patient up to date with their annual eye exam?  Yes  Who is the provider or what is the name of the office in which the patient attends annual eye exams? Dr. Jule Ser If pt is not established with a provider, would they like to be referred to a  provider to establish care? No .   Dental Screening: Recommended annual dental exams for proper oral hygiene  Community Resource Referral / Chronic Care Management: CRR required this visit?  No   CCM required this visit?  No      Plan:     I have personally reviewed and noted the following in the patient's chart:   Medical and social history Use of alcohol, tobacco or illicit drugs  Current medications and supplements including opioid prescriptions. Patient is not currently taking opioid prescriptions. Functional ability and status Nutritional status Physical activity Advanced directives List of other physicians Hospitalizations, surgeries, and ER visits in previous 12 months Vitals Screenings to include cognitive, depression, and falls Referrals and appointments  In  addition, I have reviewed and discussed with patient certain preventive protocols, quality metrics, and best practice recommendations. A written personalized care plan for preventive services as well as general preventive health recommendations were provided to patient.     Vanetta Mulders, LPN   05/09/2034   Due to this being a virtual visit, the after visit summary with patients personalized plan was offered to patient via mail or my-chart. Patient would like to access on my-chart  Nurse Notes: Patient is asking about changing Acyclovir over to Valacyclovir, states that it was discussed in recent office visit.

## 2022-10-24 NOTE — Patient Instructions (Addendum)
Debbie Hatfield , Thank you for taking time to come for your Medicare Wellness Visit. I appreciate your ongoing commitment to your health goals. Please review the following plan we discussed and let me know if I can assist you in the future.   These are the goals we discussed:  Goals      Regain Independence        This is a list of the screening recommended for you and due dates:  Health Maintenance  Topic Date Due   COVID-19 Vaccine (3 - Moderna risk series) 11/07/2022*   Zoster (Shingles) Vaccine (1 of 2) 11/10/2022*   DEXA scan (bone density measurement)  04/14/2023*   Colon Cancer Screening  04/14/2023*   Medicare Annual Wellness Visit  10/25/2023   Mammogram  12/27/2023   DTaP/Tdap/Td vaccine (3 - Td or Tdap) 12/30/2030   Pneumonia Vaccine  Completed   Flu Shot  Completed   Hepatitis C Screening: USPSTF Recommendation to screen - Ages 18-79 yo.  Completed   HPV Vaccine  Aged Out  *Topic was postponed. The date shown is not the original due date.    Advanced directives: Please bring a copy of your health care power of attorney and living will to the office to be added to your chart at your convenience.   Conditions/risks identified: Aim for 30 minutes of exercise or brisk walking, 6-8 glasses of water, and 5 servings of fruits and vegetables each day.   Next appointment: Follow up in one year for your annual wellness visit    Preventive Care 65 Years and Older, Female Preventive care refers to lifestyle choices and visits with your health care provider that can promote health and wellness. What does preventive care include? A yearly physical exam. This is also called an annual well check. Dental exams once or twice a year. Routine eye exams. Ask your health care provider how often you should have your eyes checked. Personal lifestyle choices, including: Daily care of your teeth and gums. Regular physical activity. Eating a healthy diet. Avoiding tobacco and drug  use. Limiting alcohol use. Practicing safe sex. Taking low-dose aspirin every day. Taking vitamin and mineral supplements as recommended by your health care provider. What happens during an annual well check? The services and screenings done by your health care provider during your annual well check will depend on your age, overall health, lifestyle risk factors, and family history of disease. Counseling  Your health care provider may ask you questions about your: Alcohol use. Tobacco use. Drug use. Emotional well-being. Home and relationship well-being. Sexual activity. Eating habits. History of falls. Memory and ability to understand (cognition). Work and work Statistician. Reproductive health. Screening  You may have the following tests or measurements: Height, weight, and BMI. Blood pressure. Lipid and cholesterol levels. These may be checked every 5 years, or more frequently if you are over 56 years old. Skin check. Lung cancer screening. You may have this screening every year starting at age 27 if you have a 30-pack-year history of smoking and currently smoke or have quit within the past 15 years. Fecal occult blood test (FOBT) of the stool. You may have this test every year starting at age 54. Flexible sigmoidoscopy or colonoscopy. You may have a sigmoidoscopy every 5 years or a colonoscopy every 10 years starting at age 7. Hepatitis C blood test. Hepatitis B blood test. Sexually transmitted disease (STD) testing. Diabetes screening. This is done by checking your blood sugar (glucose) after you have not  eaten for a while (fasting). You may have this done every 1-3 years. Bone density scan. This is done to screen for osteoporosis. You may have this done starting at age 23. Mammogram. This may be done every 1-2 years. Talk to your health care provider about how often you should have regular mammograms. Talk with your health care provider about your test results, treatment  options, and if necessary, the need for more tests. Vaccines  Your health care provider may recommend certain vaccines, such as: Influenza vaccine. This is recommended every year. Tetanus, diphtheria, and acellular pertussis (Tdap, Td) vaccine. You may need a Td booster every 10 years. Zoster vaccine. You may need this after age 62. Pneumococcal 13-valent conjugate (PCV13) vaccine. One dose is recommended after age 85. Pneumococcal polysaccharide (PPSV23) vaccine. One dose is recommended after age 17. Talk to your health care provider about which screenings and vaccines you need and how often you need them. This information is not intended to replace advice given to you by your health care provider. Make sure you discuss any questions you have with your health care provider. Document Released: 10/28/2015 Document Revised: 06/20/2016 Document Reviewed: 08/02/2015 Elsevier Interactive Patient Education  2017 [employer] Prevention in the Home Falls can cause injuries. They can happen to people of all ages. There are many things you can do to make your home safe and to help prevent falls. What can I do on the outside of my home? Regularly fix the edges of walkways and driveways and fix any cracks. Remove anything that might make you trip as you walk through a door, such as a raised step or threshold. Trim any bushes or trees on the path to your home. Use bright outdoor lighting. Clear any walking paths of anything that might make someone trip, such as rocks or tools. Regularly check to see if handrails are loose or broken. Make sure that both sides of any steps have handrails. Any raised decks and porches should have guardrails on the edges. Have any leaves, snow, or ice cleared regularly. Use sand or salt on walking paths during winter. Clean up any spills in your garage right away. This includes oil or grease spills. What can I do in the bathroom? Use night lights. Install grab  bars by the toilet and in the tub and shower. Do not use towel bars as grab bars. Use non-skid mats or decals in the tub or shower. If you need to sit down in the shower, use a plastic, non-slip stool. Keep the floor dry. Clean up any water that spills on the floor as soon as it happens. Remove soap buildup in the tub or shower regularly. Attach bath mats securely with double-sided non-slip rug tape. Do not have throw rugs and other things on the floor that can make you trip. What can I do in the bedroom? Use night lights. Make sure that you have a light by your bed that is easy to reach. Do not use any sheets or blankets that are too big for your bed. They should not hang down onto the floor. Have a firm chair that has side arms. You can use this for support while you get dressed. Do not have throw rugs and other things on the floor that can make you trip. What can I do in the kitchen? Clean up any spills right away. Avoid walking on wet floors. Keep items that you use a lot in easy-to-reach places. If you need to reach  something above you, use a strong step stool that has a grab bar. Keep electrical cords out of the way. Do not use floor polish or wax that makes floors slippery. If you must use wax, use non-skid floor wax. Do not have throw rugs and other things on the floor that can make you trip. What can I do with my stairs? Do not leave any items on the stairs. Make sure that there are handrails on both sides of the stairs and use them. Fix handrails that are broken or loose. Make sure that handrails are as long as the stairways. Check any carpeting to make sure that it is firmly attached to the stairs. Fix any carpet that is loose or worn. Avoid having throw rugs at the top or bottom of the stairs. If you do have throw rugs, attach them to the floor with carpet tape. Make sure that you have a light switch at the top of the stairs and the bottom of the stairs. If you do not have them,  ask someone to add them for you. What else can I do to help prevent falls? Wear shoes that: Do not have high heels. Have rubber bottoms. Are comfortable and fit you well. Are closed at the toe. Do not wear sandals. If you use a stepladder: Make sure that it is fully opened. Do not climb a closed stepladder. Make sure that both sides of the stepladder are locked into place. Ask someone to hold it for you, if possible. Clearly mark and make sure that you can see: Any grab bars or handrails. First and last steps. Where the edge of each step is. Use tools that help you move around (mobility aids) if they are needed. These include: Canes. Walkers. Scooters. Crutches. Turn on the lights when you go into a dark area. Replace any light bulbs as soon as they burn out. Set up your furniture so you have a clear path. Avoid moving your furniture around. If any of your floors are uneven, fix them. If there are any pets around you, be aware of where they are. Review your medicines with your doctor. Some medicines can make you feel dizzy. This can increase your chance of falling. Ask your doctor what other things that you can do to help prevent falls. This information is not intended to replace advice given to you by your health care provider. Make sure you discuss any questions you have with your health care provider. Document Released: 07/28/2009 Document Revised: 03/08/2016 Document Reviewed: 11/05/2014 Elsevier Interactive Patient Education  2017 [employer].

## 2022-10-29 ENCOUNTER — Telehealth: Payer: Self-pay | Admitting: Internal Medicine

## 2022-10-29 NOTE — Telephone Encounter (Signed)
PT visits with a medical certification form to be filled out by Dr.Jones. Form has been placed in Dr.Jones' mailbox.  CB: [phone]

## 2022-10-30 DIAGNOSIS — Z0289 Encounter for other administrative examinations: Secondary | ICD-10-CM

## 2022-10-30 NOTE — Telephone Encounter (Signed)
Forms have been completed, signed by PCP, and faxed back.  Copy sent to charge Copy for pt at front desk for pick up per her request. Original filed with CMA

## 2022-10-31 ENCOUNTER — Ambulatory Visit: Payer: Medicare Other | Admitting: Speech Pathology

## 2022-10-31 ENCOUNTER — Ambulatory Visit: Payer: Medicare Other | Admitting: Neurology

## 2022-10-31 DIAGNOSIS — R41841 Cognitive communication deficit: Secondary | ICD-10-CM

## 2022-10-31 DIAGNOSIS — R4701 Aphasia: Secondary | ICD-10-CM

## 2022-10-31 NOTE — Therapy (Signed)
OUTPATIENT SPEECH LANGUAGE PATHOLOGY TREATMENT   Patient Name: Debbie Hatfield MRN: [id number] DOB:January 29, 1956, 67 y.o., female Today's Date: 10/31/2022  PCP: Dr. Scarlette Calico REFERRING PROVIDER: Dr. Arlice Colt   End of Session - 10/31/22 0935     Visit Number 9    Number of Visits 17    Date for SLP Re-Evaluation 11/02/22    Authorization Type Medicare    Progress Note Due on Visit 10    SLP Start Time 0935    SLP Stop Time  1015    SLP Time Calculation (min) 40 min    Activity Tolerance Patient tolerated treatment well                 Past Medical History:  Diagnosis Date   Closed fracture of right distal radius    Gastroparesis    GERD (gastroesophageal reflux disease)    Hypertension    Irritable bowel disease    Lupus ([place])    Major depression    Stroke [facility])    Trigeminal neuralgia    Past Surgical History:  Procedure Laterality Date   BUBBLE STUDY  07/05/2022   Procedure: BUBBLE STUDY;  Surgeon: Elouise Munroe, MD;  Location: [place];  Service: Cardiology;;   CHOLECYSTECTOMY     COLONOSCOPY     LOOP RECORDER IMPLANT  2021   ORIF WRIST FRACTURE Right 09/22/2020   Procedure: OPEN REDUCTION INTERNAL FIXATION (ORIF) WRIST FRACTURE;  Surgeon: Verner Mould, MD;  Location: [place];  Service: Orthopedics;  Laterality: Right;  94min   TEE WITHOUT CARDIOVERSION N/A 07/05/2022   Procedure: TRANSESOPHAGEAL ECHOCARDIOGRAM (TEE);  Surgeon: Elouise Munroe, MD;  Location: [place];  Service: Cardiology;  Laterality: N/A;   Patient Active Problem List   Diagnosis Date Noted   Hordeolum externum of left lower eyelid 10/22/2022   Nonrheumatic mitral valve regurgitation 05/10/2022   Stroke (cerebrum) ([place]) 05/07/2022   Anemia, chronic disease 05/06/2022   Hyperlipidemia LDL goal <100 03/26/2022   Recurrent major depressive disorder, in partial remission ([place]) 11/15/2021   Need for shingles vaccine 11/15/2021   Visit for  screening mammogram 11/15/2021   Need for vaccination 11/15/2021   GAD (generalized anxiety disorder) 11/15/2021   Stage 3a chronic kidney disease ([place]) 11/15/2021   Trigeminal neuralgia    Lupus ([place])    Irritable bowel disease    GERD (gastroesophageal reflux disease)    Hypertension    Gastroparesis    Degeneration of lumbar intervertebral disc 01/26/2020   Osteopenia 12/14/2015   Peptic ulcer disease 12/14/2015    ONSET DATE: referral 08/15/2022   REFERRING DIAG: I63.311 (ICD-10-CM) - Cerebrovascular accident (CVA) due to thrombosis of right middle cerebral artery (HCC)   THERAPY DIAG:  Cognitive communication deficit  Aphasia  Rationale for Evaluation and Treatment: Rehabilitation  SUBJECTIVE:   SUBJECTIVE STATEMENT: "things are pretty much under control"    PAIN: Are you having pain? No   OBJECTIVE:   TODAY'S TREATMENT:  10-31-21: During conversation, pt reveals implementation and carryover of several strategies targeted during ST course resulting in improved performance in home management tasks, reduced memory mistakes, and management of cognitive fatigue. Pt able to ID the following strategies and provide examples demonstrating successful carryover: pacing, setting boundaries, using external aids: to-do list, grocery list, asking for assistance (delegation). SLP brings awareness to strategies described and questions if employment of said strategies has been beneficial. Pt with positive response. Ongoing report of challenges arising when distractions are present  or during complex tasks. Over next two sessions plan to challenge pt's language skills in complex language task and incorporate distractions during mental task to challenge recall, attention, problem solving with increased complexity.   10-19-22: Pt presents with home work completed, demonstrating good insight into strengths and weaknesses and how each would be affected by choice in complex decision making problem.  Reporting was helpful to get out of head onto paper but is still challenging decision to make. Per pt, numerous significant life circumstances at this time which are impacting brain fog and cognitive fatigue. Demonstrates increasing awareness of factors which negatively impact overall cognitive functioning and ability to implement strategies and compensations to cope with onoging challenges. Pt has requested A from sister to manage circumstances and reports to incorporating pacing and delegation, and external aids. Throughout complex discussion of ~20 minutes, pt evidencing no overt anomia/dysnomia. 100% intelligible with speech observed to be within gross normal limits.    10-17-22: Valisa endorsing ongoing difficulties with decision making. Upon questioning, reveals challenges lie in ability to process and consider numerous pieces of information at one time or quickly. SLP advises on strategies to aid in decision making with emphasis this date on getting information out of head via writing to faciliate evaluation of all information. With writing, pt can also add information as she considers complex problems without forgetting information already thought of. Modeled use of pro/con list for numerous potential options for current life decision pt is struggling to make. Encouraged pt to consider consequences for potential options made and potential solutions for cons if able. These should be added to overall written evaluation of complex problem. Pt to attempt strategy use for making decision regarding discussed personal event before next session and will bring back.   10-10-22: SLP led pt through retrospective analysis of recent events, which were complex and challenging, to determine strategies which were beneficial or should be implemented to aid in future improved competencies. Beneficial strategies were in setting clear boundaries, implemented group text messages, asking for A from family members, pacing. In  future, pt determines employment of writing things down may be beneficial and setting boundaries sooner would be helpful. 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 Overall going well with rare instances of forgetting "details," such as diet instructions for a specific medication or checking for access to equipment. SLP suggests use of external aid- d/c checklist to aid in comprehensive and complete d/c instructions. Use of checklist can aid in making usre all important areas are discussed, be used to write down questions she would need answers to and be memory aid should she get off track d/t pt and family questions. Pt to generate list to practice with in upcoming session. SLP suggests modification to grocery list use: check list before getting into checkout line. Pt going to grocery store upon leaving, will refer to list and report back if was benefiical for getting all desired items.   PATIENT EDUCATION: Education details: see above Person educated: Patient Education method: Medical illustrator Education comprehension: verbalized understanding, returned demonstration, and needs further education   GOALS: Goals reviewed with patient? Yes  SHORT TERM GOALS: Target date: 10/05/2022  Pt will complete standardized cognitive assessment and/or patient reported outcome measure to assess need for cognitive interventions Baseline: Cognitive Function PROM=46 Goal status: MET  2.  Pt will demonstrate auditory processing strategies in structured activities with rare min-A Baseline:  Goal status: MET  3.  Pt will complete moderately complex structured language tasks with 80% accuracy given occasional min-A over 2 sessions Baseline:  Goal status: MET  4.  Pt will demonstrate over-articulation and pacing dysarthria strategies in moderately complex speech tasks with 80% accuracy given rare  min-A Baseline:  Goal status: MET   LONG TERM GOALS: Target date: 11/02/2022  Pt will employ auditory comprehension strategies during 15 minute conversation in busy environment (e.g. therapy gym), resulting in ability to respond appropriately to comments and questions in conversation with rare min-A Baseline:  Goal status: IN PROGRESS  2.  Pt will complete moderately complex structured language tasks with 90% accuracy given rare min-A over 2 sessions Baseline:  Goal status: IN PROGRESS  3.  Pt will employ anomia compensations in 80% of opportunities, over 10 minute conversation, with rare min-A Baseline:  Goal status: IN PROGRESS  ASSESSMENT:  CLINICAL IMPRESSION: Patient is a 67 y.o. F who was seen today for cognitive linguistic evaluation s/p stroke. Pt reporting ongoing memory, word finding, and dysarthria. Pt's goal is to return to work if able. Initiated education and instruction of memory, attention, and processing strategies for accurate completion of functional household tasks. Skilled ST is indicated to maximize cognition, speech, and language, and facilitate potential return to work.  OBJECTIVE IMPAIRMENTS: include attention, memory, executive functioning, aphasia, and dysarthria. These impairments are limiting patient from return to work, ADLs/IADLs, and effectively communicating at home and in community. Factors affecting potential to achieve goals and functional outcome are  lack of support . Patient will benefit from skilled SLP services to address above impairments and improve overall function.  REHAB POTENTIAL: Good  PLAN:  SLP FREQUENCY: 2x/week  SLP DURATION: 10 weeks  PLANNED INTERVENTIONS: Language facilitation, Cueing hierachy, Cognitive reorganization, Internal/external aids, Oral motor exercises, Functional tasks, SLP instruction and feedback, Compensatory strategies, and Patient/family education    Su Monks, CCC-SLP 10/31/2022, 9:35 AM

## 2022-11-01 ENCOUNTER — Inpatient Hospital Stay: Payer: Medicare Other | Attending: Hematology and Oncology

## 2022-11-01 ENCOUNTER — Inpatient Hospital Stay (HOSPITAL_BASED_OUTPATIENT_CLINIC_OR_DEPARTMENT_OTHER): Payer: Medicare Other | Admitting: Hematology and Oncology

## 2022-11-01 ENCOUNTER — Other Ambulatory Visit: Payer: Self-pay

## 2022-11-01 VITALS — BP 159/69 | HR 78 | Temp 97.9°F | Resp 20 | Wt 153.8 lb

## 2022-11-01 DIAGNOSIS — Z800 Family history of malignant neoplasm of digestive organs: Secondary | ICD-10-CM

## 2022-11-01 DIAGNOSIS — I6340 Cerebral infarction due to embolism of unspecified cerebral artery: Secondary | ICD-10-CM

## 2022-11-01 DIAGNOSIS — I639 Cerebral infarction, unspecified: Secondary | ICD-10-CM | POA: Insufficient documentation

## 2022-11-01 DIAGNOSIS — R760 Raised antibody titer: Secondary | ICD-10-CM

## 2022-11-01 DIAGNOSIS — D649 Anemia, unspecified: Secondary | ICD-10-CM | POA: Insufficient documentation

## 2022-11-01 DIAGNOSIS — Z7902 Long term (current) use of antithrombotics/antiplatelets: Secondary | ICD-10-CM | POA: Diagnosis not present

## 2022-11-01 DIAGNOSIS — D500 Iron deficiency anemia secondary to blood loss (chronic): Secondary | ICD-10-CM

## 2022-11-01 DIAGNOSIS — I10 Essential (primary) hypertension: Secondary | ICD-10-CM | POA: Diagnosis not present

## 2022-11-01 DIAGNOSIS — K219 Gastro-esophageal reflux disease without esophagitis: Secondary | ICD-10-CM

## 2022-11-01 DIAGNOSIS — Z7982 Long term (current) use of aspirin: Secondary | ICD-10-CM | POA: Insufficient documentation

## 2022-11-01 DIAGNOSIS — Z79899 Other long term (current) drug therapy: Secondary | ICD-10-CM | POA: Diagnosis not present

## 2022-11-01 LAB — CMP (CANCER CENTER ONLY)
ALT: 18 U/L (ref 0–44)
AST: 24 U/L (ref 15–41)
Albumin: 4.3 g/dL (ref 3.5–5.0)
Alkaline Phosphatase: 87 U/L (ref 38–126)
Anion gap: 7 (ref 5–15)
BUN: 20 mg/dL (ref 8–23)
CO2: 28 mmol/L (ref 22–32)
Calcium: 9.7 mg/dL (ref 8.9–10.3)
Chloride: 106 mmol/L (ref 98–111)
Creatinine: 1 mg/dL (ref 0.44–1.00)
GFR, Estimated: >60 mL/min (ref >60)
Glucose, Bld: 75 mg/dL (ref 70–99)
Potassium: 3.9 mmol/L (ref 3.5–5.1)
Sodium: 141 mmol/L (ref 135–145)
Total Bilirubin: 0.4 mg/dL (ref 0.3–1.2)
Total Protein: 7.3 g/dL (ref 6.5–8.1)

## 2022-11-01 LAB — IRON AND IRON BINDING CAPACITY (CC-WL,HP ONLY)
Iron: 101 ug/dL (ref 28–170)
Saturation Ratios: 30 % (ref 10.4–31.8)
TIBC: 332 ug/dL (ref 250–450)
UIBC: 231 ug/dL (ref 148–442)

## 2022-11-01 LAB — CBC WITH DIFFERENTIAL (CANCER CENTER ONLY)
Abs Immature Granulocytes: 0.01 10*3/uL (ref 0.00–0.07)
Basophils Absolute: 0 10*3/uL (ref 0.0–0.1)
Basophils Relative: 0 %
Eosinophils Absolute: 0.1 10*3/uL (ref 0.0–0.5)
Eosinophils Relative: 2 %
HCT: 35.5 % — ABNORMAL LOW (ref 36.0–46.0)
Hemoglobin: 11.6 g/dL — ABNORMAL LOW (ref 12.0–15.0)
Immature Granulocytes: 0 %
Lymphocytes Relative: 43 %
Lymphs Abs: 2.4 10*3/uL (ref 0.7–4.0)
MCH: 29.8 pg (ref 26.0–34.0)
MCHC: 32.7 g/dL (ref 30.0–36.0)
MCV: 91.3 fL (ref 80.0–100.0)
Monocytes Absolute: 0.7 10*3/uL (ref 0.1–1.0)
Monocytes Relative: 13 %
Neutro Abs: 2.3 10*3/uL (ref 1.7–7.7)
Neutrophils Relative %: 42 %
Platelet Count: 314 10*3/uL (ref 150–400)
RBC: 3.89 MIL/uL (ref 3.87–5.11)
RDW: 12.6 % (ref 11.5–15.5)
WBC Count: 5.6 10*3/uL (ref 4.0–10.5)
nRBC: 0 % (ref 0.0–0.2)

## 2022-11-01 LAB — FERRITIN: Ferritin: 25 ng/mL (ref 11–307)

## 2022-11-01 NOTE — Progress Notes (Signed)
[facility] [phone]   [phone]  INITIAL CONSULT NOTE  Patient Care Team: Etta Grandchild, MD as PCP - General (Internal Medicine)  Hematological/Oncological History # Elevated Antiphospholipid Antibodies  06/22/2022: Lupus anticoagulant panel negative, elevated Anticardiolipin IgG and Beta 2 glycoprotein IgG  11/01/2022: establish care with Dr. Leonides Schanz   CHIEF COMPLAINTS/PURPOSE OF CONSULTATION:  " Elevated Antiphospholipid Antibodies  "  HISTORY OF PRESENTING ILLNESS:  Debbie Hatfield 67 y.o. female with medical history significant for stroke, hypertension, irritable bowel disease, GERD, and gastroparesis who presents for evaluation of elevated antiphospholipid antibodies.  On review of the previous records Ms. Zollars had a stroke on 05/07/2022.  She received tPA at that time.  She subsequently underwent evaluations with neurology and rheumatology.  During her rheumatological evaluation on 06/22/2022 patient had a lupus anticoagulant panel drawn as well as anticardiolipin and beta-2 glycoprotein antibodies.  She was found to have elevation in the 2 antibodies.  Due to concern for these findings she was referred to hematology for further evaluation and management.  On exam today Ms. Friedly wrote reports that she is recovering from her stroke.  She notes that the stroke occurred in July 2023 on her right side.  She was working at [employer] when this happened.  She received tPA infusion and did quite well.  She notes that she has been evaluated by neurology and by rheumatology as well.  She does have some occasional dizzy spells.  She notes that she has some pain in her hands and feet and is currently taking Plavix and aspirin.  She is not having any issues with bleeding, bruising, or dark stools.  She does that she is undergoing speech therapy and having cognition issues as well as word finding.  She has difficulty multitasking.  On further discussion she  reports her mother had a brain aneurysm as did her maternal grandfather and 2 cousins.  She reports that she has 2 nieces with Sjogren's syndrome.  She is a nephew who also has similar rheumatological issues as well as Reynaud syndrome.  Her father died of alcohol abuse.  She notes that her husband died of Cretzfeld Gerilyn Pilgrim disease 26 years ago.  On further discussion she reports that she is a never smoker and drinks 1 glass of wine per week.  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  She otherwise denies any fevers, chills, sweats, nausea, vomiting or diarrhea.  A full 10 point ROS was otherwise negative.  MEDICAL HISTORY:  Past Medical History:  Diagnosis Date   Closed fracture of right distal radius    Gastroparesis    GERD (gastroesophageal reflux disease)    Hypertension    Irritable bowel disease    Lupus (HCC)    Major depression    Stroke [facility])    Trigeminal neuralgia     SURGICAL HISTORY: Past Surgical History:  Procedure Laterality Date   BUBBLE STUDY  07/05/2022   Procedure: BUBBLE STUDY;  Surgeon: Parke Poisson, MD;  Location: [facility] ENDOSCOPY;  Service: Cardiology;;   CHOLECYSTECTOMY     COLONOSCOPY     LOOP RECORDER IMPLANT  2021   ORIF WRIST FRACTURE Right 09/22/2020   Procedure: OPEN REDUCTION INTERNAL FIXATION (ORIF) WRIST FRACTURE;  Surgeon: Ernest Mallick, MD;  Location: [facility];  Service: Orthopedics;  Laterality: Right;    TEE WITHOUT CARDIOVERSION N/A 07/05/2022   Procedure: TRANSESOPHAGEAL ECHOCARDIOGRAM (TEE);  Surgeon: Parke Poisson,  MD;  Location: [facility];  Service: Cardiology;  Laterality: N/A;    SOCIAL HISTORY: Social History   Socioeconomic History   Marital status: Widowed    Spouse name: Not on file   Number of children: 0   Years of education: Not on file   Highest education level: Bachelor's degree (e.g., BA, AB, BS)  Occupational History    Comment: RN ortho/neuro at [employer]   Tobacco Use   Smoking status: Never    Passive exposure: Never   Smokeless tobacco: Never  Vaping Use   Vaping Use: Never used  Substance and Sexual Activity   Alcohol use: Yes    Comment: social   Drug use: Never   Sexual activity: Not Currently    Birth control/protection: Post-menopausal  Other Topics Concern   Not on file  Social History Narrative   06/13/22 living with her mother   Social Determinants of Health   Financial Resource Strain: Low Risk  (10/24/2022)   Overall Financial Resource Strain (CARDIA)    Difficulty of Paying Living Expenses: Not hard at all  Food Insecurity: No Food Insecurity (10/24/2022)   Hunger Vital Sign    Worried About Running Out of Food in the Last Year: Never true    Ran Out of Food in the Last Year: Never true  Transportation Needs: No Transportation Needs (10/24/2022)   PRAPARE - Hydrologist (Medical): No    Lack of Transportation (Non-Medical): No  Physical Activity: Inactive (10/24/2022)   Exercise Vital Sign    Days of Exercise per Week: 0 days    Minutes of Exercise per Session: 0 min  Stress: No Stress Concern Present (10/24/2022)   [place]    Feeling of Stress : Only a little  Social Connections: Moderately Integrated (10/24/2022)   Social Connection and Isolation Panel [NHANES]    Frequency of Communication with Friends and Family: More than three times a week    Frequency of Social Gatherings with Friends and Family: Three times a week    Attends Religious Services: More than 4 times per year    Active Member of Clubs or Organizations: Yes    Attends Archivist Meetings: More than 4 times per year    Marital Status: Widowed  Intimate Partner Violence: Not At Risk (10/24/2022)   Humiliation, Afraid, Rape, and Kick questionnaire    Fear of Current or Ex-Partner: No    Emotionally Abused: No    Physically Abused: No     Sexually Abused: No    FAMILY HISTORY: Family History  Problem Relation Age of Onset   Heart block Mother        Pacemaker   Hypertension Mother    Depression Mother    Gout Mother    Macular degeneration Mother    Aneurysm Mother    Alcoholism Father    Congestive Heart Failure Maternal Grandmother    Diabetes Maternal Grandmother    Congestive Heart Failure Maternal Grandfather    Colon cancer Maternal Grandfather    Stroke Paternal Grandfather    Hypertension Paternal Grandfather     ALLERGIES:  is allergic to plaquenil [hydroxychloroquine], sulfa antibiotics, and penicillins.  MEDICATIONS:  Current Outpatient Medications  Medication Sig Dispense Refill   acetaminophen (TYLENOL) 325 MG tablet Take 650 mg by mouth as needed.     acyclovir (ZOVIRAX) 400 MG tablet Take 1 tablet (400 mg total) by mouth daily. 90 tablet  1   amLODipine (NORVASC) 5 MG tablet Take 1 tablet (5 mg total) by mouth daily. 90 tablet 1   aspirin EC 81 MG tablet Take 1 tablet (81 mg total) by mouth daily. Swallow whole. (Patient taking differently: Take 81 mg by mouth at bedtime. Swallow whole.) 30 tablet 12   baclofen (LIORESAL) 10 MG tablet Take 1 tablet (10 mg total) by mouth 3 (three) times daily. (Patient taking differently: Take 10 mg by mouth as needed.) 90 each 5   carbamazepine (CARBATROL) 300 MG 12 hr capsule Take 1 capsule (300 mg total) by mouth at bedtime. 90 capsule 0   clopidogrel (PLAVIX) 75 MG tablet Take 1 tablet (75 mg total) by mouth daily. 30 tablet 11   gabapentin (NEURONTIN) 300 MG capsule Take 600 mg by mouth 2 (two) times daily.     hydrochlorothiazide (MICROZIDE) 12.5 MG capsule Take 1 capsule (12.5 mg total) by mouth daily. 30 capsule 11   hydrOXYzine (ATARAX) 10 MG tablet Take 1 tablet (10 mg total) by mouth every 8 (eight) hours as needed for anxiety. 270 tablet 0   meclizine (ANTIVERT) 25 MG tablet Take 25 mg by mouth daily as needed for dizziness.     pantoprazole (PROTONIX)  40 MG tablet Take 1 tablet (40 mg total) by mouth daily. 90 tablet 3   rosuvastatin (CRESTOR) 20 MG tablet Take 1 tablet (20 mg total) by mouth daily. 90 tablet 1   telmisartan-hydrochlorothiazide (MICARDIS HCT) 40-12.5 MG tablet TAKE 1 TABLET BY MOUTH DAILY 90 tablet 0   tizanidine (ZANAFLEX) 2 MG capsule Take 4 mg by mouth at bedtime.     traMADol (ULTRAM) 50 MG tablet Take 50 mg by mouth 2 (two) times daily.     venlafaxine XR (EFFEXOR-XR) 37.5 MG 24 hr capsule TAKE 2 CAPSULES(75 MG) BY MOUTH DAILY WITH BREAKFAST (Patient taking differently: Take 75 mg by mouth at bedtime.) 180 capsule 1   No current facility-administered medications for this visit.    REVIEW OF SYSTEMS:   Constitutional: ( - ) fevers, ( - )  chills , ( - ) night sweats Eyes: ( - ) blurriness of vision, ( - ) double vision, ( - ) watery eyes Ears, nose, mouth, throat, and face: ( - ) mucositis, ( - ) sore throat Respiratory: ( - ) cough, ( - ) dyspnea, ( - ) wheezes Cardiovascular: ( - ) palpitation, ( - ) chest discomfort, ( - ) lower extremity swelling Gastrointestinal:  ( - ) nausea, ( - ) heartburn, ( - ) change in bowel habits Skin: ( - ) abnormal skin rashes Lymphatics: ( - ) new lymphadenopathy, ( - ) easy bruising Neurological: ( - ) numbness, ( - ) tingling, ( - ) new weaknesses Behavioral/Psych: ( - ) mood change, ( - ) new changes  All other systems were reviewed with the patient and are negative.  PHYSICAL EXAMINATION:  Vitals:   11/01/22 1230  BP: (!) 159/69  Pulse: 78  Resp: 20  Temp: 97.9 F (36.6 C)  SpO2: 100%   Filed Weights   11/01/22 1230  Weight: 153 lb 12.8 oz (69.8 kg)    GENERAL: well appearing middle-aged Caucasian female in NAD  SKIN: skin color, texture, turgor are normal, no rashes or significant lesions EYES: conjunctiva are pink and non-injected, sclera clear LUNGS: clear to auscultation and percussion with normal breathing effort HEART: regular rate & rhythm and no murmurs  and no lower extremity edema Musculoskeletal: no cyanosis of  digits and no clubbing  PSYCH: alert & oriented x 3, fluent speech NEURO: no focal motor/sensory deficits  LABORATORY DATA:  I have reviewed the data as listed    Latest Ref Rng & Units 10/19/2022    9:22 AM 07/02/2022    8:12 AM 06/22/2022   11:27 AM  CBC  WBC 3.8 - 10.8 Thousand/uL 4.0  3.9  3.7   Hemoglobin 11.7 - 15.5 g/dL 16.111.6  09.612.2  04.511.5   Hematocrit 35.0 - 45.0 % 35.2  36.5  34.9   Platelets 140 - 400 Thousand/uL 311  373  305        Latest Ref Rng & Units 10/19/2022    9:22 AM 07/02/2022    8:12 AM 06/22/2022   11:27 AM  CMP  Glucose 65 - 99 mg/dL 409103  94  811102   BUN 7 - 25 mg/dL 15  15  20    Creatinine 0.50 - 1.05 mg/dL 9.141.01  7.821.09  9.560.97   Sodium 135 - 146 mmol/L 140  137  138   Potassium 3.5 - 5.3 mmol/L 4.1  3.8  4.1   Chloride 98 - 110 mmol/L 103  99  102   CO2 20 - 32 mmol/L 30  22  24    Calcium 8.6 - 10.4 mg/dL 9.3  9.5  9.6   Total Protein 6.1 - 8.1 g/dL 7.0   7.1    7.1   Total Bilirubin 0.2 - 1.2 mg/dL 0.5   0.5   AST 10 - 35 U/L 26   25   ALT 6 - 29 U/L 16   21      ASSESSMENT & PLAN Margret Chanceeresa R Valentine 67 y.o. female with medical history significant for stroke, hypertension, irritable bowel disease, GERD, and gastroparesis who presents for evaluation of elevated antiphospholipid antibodies.  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  Antiphospholipid antibody syndrome is a hypercoagulation and autoimmune condition that results in increased frequency of venous and arterial thrombosis. In order to meet the diagnostic criteria for APS a patient must have one of both the clinical and laboratory criteria noted from the [place] Criteria (noted below). Laboratory criteria must be met on at least 2 occasions, separated by at least 12 weeks. Presence of antibodies in the  absence of clinical criteria fails to meet diagnostic criteria and does not merit treatment or further investigation.     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 -- In order to confirm the diagnosis we will repeat testing.  If these antibodies are positive again we will confirm the diagnosis of APS. -- We discussed the possible outcomes of the testing.  If patient is positive we will need to start on Coumadin therapy which will be continued indefinitely. -- Will need to connect patient to a Coumadin clinic, either in cardiology or primary care provider pending the results of the above studies. -- Patient noted to have mild anemia on last labs, will order ferritin and iron panel today. -- Return to clinic pending the results of above studies.   Orders Placed This Encounter  Procedures   CBC with Differential (Cancer Center Only)    Standing Status:   Future    Number of Occurrences:   1    Standing Expiration Date:   11/02/2023  CMP (Cancer Center only)    Standing Status:   Future    Number of Occurrences:   1    Standing Expiration Date:   11/02/2023   Beta-2-glycoprotein i abs, IgG/M/A    Standing Status:   Future    Number of Occurrences:   1    Standing Expiration Date:   11/01/2023   Cardiolipin antibodies, IgG, IgM, IgA*    Standing Status:   Future    Number of Occurrences:   1    Standing Expiration Date:   11/01/2023   Lupus anticoagulant panel*    Standing Status:   Future    Number of Occurrences:   1    Standing Expiration Date:   11/01/2023   Ferritin    Standing Status:   Future    Number of Occurrences:   1    Standing Expiration Date:   11/02/2023   Iron and Iron Binding Capacity ([facility] only)    Standing Status:   Future    Number of Occurrences:   1    Standing Expiration Date:   11/02/2023    All questions were answered. The patient knows to call the clinic with any  problems, questions or concerns.  A total of more than 60 minutes were spent on this encounter with face-to-face time and non-face-to-face time, including preparing to see the patient, ordering tests and/or medications, counseling the patient and coordination of care as outlined above.   Ulysees Barns, MD Department of Hematology/Oncology [facility] at [facility] Phone: [phone] Pager: [phone] Email: [email or web address]  11/01/2022 1:42 PM  Literature Support:  Carney Corners MD, Nat Christen, Branch DW, Brey RL, [place], Derksen RH, [place], Noel Christmas, Meroni PL, Reber G, [place], Tincani A, Vlachoyiannopoulos PG, Krilis [place]. International consensus statement on an update of the classification criteria for definite antiphospholipid syndrome (APS). J Thromb Haemost. 2006 Feb;4(2):295-306.   --Antiphospholipid antibody syndrome (APS) is present if at least one of the clinical criteria and one of the laboratory criteria that follow are met* Clinical criteria 1. Vascular thrombosis One or more clinical episodes of arterial, venous, or small vessel thrombosis, in any tissue or organ. Thrombosis must be confirmed by objective validated criteria (i.e. unequivocal findings of appropriate imaging studies or histopathology). For histopathologic confirmation, thrombosis should be present without significant evidence of inflammation in the vessel wall. 2. Pregnancy morbidity (a) One or more unexplained deaths of a morphologically normal fetus at or beyond the 10th week of gestation, with normal fetal morphology documented by ultrasound or by direct examination of the fetus, or (b) One or more premature births of a morphologically normal neonate before the 34th week of gestation because of: (i) eclampsia or severe pre?eclampsia defined according to standard definitions, or (ii) recognized features of placental insufficiency or (c) Three or more unexplained  consecutive spontaneous abortions before the 10th week of gestation, with maternal anatomic or hormonal abnormalities and paternal and maternal chromosomal causes excluded. In studies of populations of patients who have more than one type of pregnancy morbidity, investigators are strongly encouraged to stratify groups of subjects according to a, b, or c above.    Laboratory criteria 1. Lupus anticoagulant (LA) present in plasma, on two or more occasions at least 12weeks apart, detected according to the guidelines of the International Society on Thrombosis and Haemostasis (Scientific Subcommittee on LAs/phospholipid?dependent antibodies) . 2. Anticardiolipin (aCL) antibody of IgG and/or IgM isotype in serum or plasma, present  in medium or high titer (i.e. >40 GPL or MPL, or >the 99th percentile), on two or more occasions, at least 12weeks apart, measured by a standardized ELISA . 3. Anti??2 glycoprotein?I antibody of IgG and/or IgM isotype in serum or plasma (in titer >the 99th percentile), present on two or more occasions, at least 12weeks apart, measured by a standardized ELISA, according to recommended procedures.     Keturah Shavers, Regan L. Randomised controlled trial of aspirin and aspirin plus heparin in pregnant women with recurrent miscarriage associated with phospholipid antibodies (or antiphospholipid antibodies). BMJ. 1997 Jan 25;314(7076):253-7.   --Treatment with aspirin and heparin leads to a significantly higher rate of live births in women with a history of recurrent miscarriage associated with phospholipid antibodies than that achieved with aspirin alone.

## 2022-11-02 ENCOUNTER — Encounter: Payer: Self-pay | Admitting: Internal Medicine

## 2022-11-02 ENCOUNTER — Ambulatory Visit: Payer: Medicare Other | Admitting: Speech Pathology

## 2022-11-02 DIAGNOSIS — R41841 Cognitive communication deficit: Secondary | ICD-10-CM | POA: Diagnosis not present

## 2022-11-02 DIAGNOSIS — R4701 Aphasia: Secondary | ICD-10-CM

## 2022-11-02 DIAGNOSIS — R471 Dysarthria and anarthria: Secondary | ICD-10-CM

## 2022-11-02 LAB — CARDIOLIPIN ANTIBODIES, IGG, IGM, IGA
Anticardiolipin IgA: <9 APL U/mL (ref 0–11)
Anticardiolipin IgG: <9 GPL U/mL (ref 0–14)
Anticardiolipin IgM: <9 MPL U/mL (ref 0–12)

## 2022-11-02 LAB — LUPUS ANTICOAGULANT PANEL
DRVVT: 36.2 s (ref 0.0–47.0)
PTT Lupus Anticoagulant: 30.3 s (ref 0.0–43.5)

## 2022-11-02 LAB — BETA-2-GLYCOPROTEIN I ABS, IGG/M/A
Beta-2 Glyco I IgG: 31 GPI IgG units — ABNORMAL HIGH (ref 0–20)
Beta-2-Glycoprotein I IgA: <9 GPI IgA units (ref 0–25)
Beta-2-Glycoprotein I IgM: <9 GPI IgM units (ref 0–32)

## 2022-11-02 NOTE — Therapy (Signed)
OUTPATIENT SPEECH LANGUAGE PATHOLOGY TREATMENT   Patient Name: Debbie Hatfield MRN: [id number] DOB:23-Mar-1956, 67 y.o., female Today's Date: 11/02/2022  PCP: Dr. [name] Linger REFERRING PROVIDER: Dr. Despina Arias   End of Session - 11/02/22 0852     Visit Number 10    Number of Visits 17    Date for SLP Re-Evaluation 11/09/22   +1 week for recert   Authorization Type Medicare    Progress Note Due on Visit 10    SLP Start Time [phone]    SLP Stop Time  0933    SLP Time Calculation (min) 41 min    Activity Tolerance Patient tolerated treatment well                 Past Medical History:  Diagnosis Date   Closed fracture of right distal radius    Gastroparesis    GERD (gastroesophageal reflux disease)    Hypertension    Irritable bowel disease    Lupus (HCC)    Major depression    Stroke [facility])    Trigeminal neuralgia    Past Surgical History:  Procedure Laterality Date   BUBBLE STUDY  07/05/2022   Procedure: BUBBLE STUDY;  Surgeon: Parke Poisson, MD;  Location: [facility] ENDOSCOPY;  Service: Cardiology;;   CHOLECYSTECTOMY     COLONOSCOPY     LOOP RECORDER IMPLANT  2021   ORIF WRIST FRACTURE Right 09/22/2020   Procedure: OPEN REDUCTION INTERNAL FIXATION (ORIF) WRIST FRACTURE;  Surgeon: Ernest Mallick, MD;  Location: [facility];  Service: Orthopedics;  Laterality: Right;    TEE WITHOUT CARDIOVERSION N/A 07/05/2022   Procedure: TRANSESOPHAGEAL ECHOCARDIOGRAM (TEE);  Surgeon: Parke Poisson, MD;  Location: [facility] ENDOSCOPY;  Service: Cardiology;  Laterality: N/A;   Patient Active Problem List   Diagnosis Date Noted   Hordeolum externum of left lower eyelid 10/22/2022   Nonrheumatic mitral valve regurgitation 05/10/2022   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   Anemia, chronic disease 05/06/2022   Hyperlipidemia LDL goal <100 03/26/2022   Recurrent major depressive disorder, in partial remission (HCC) 11/15/2021   Need for shingles vaccine  11/15/2021   Visit for screening mammogram 11/15/2021   Need for vaccination 11/15/2021   GAD (generalized anxiety disorder) 11/15/2021   Stage 3a chronic kidney disease (HCC) 11/15/2021   Trigeminal neuralgia    Lupus (HCC)    Irritable bowel disease    GERD (gastroesophageal reflux disease)    Hypertension    Gastroparesis    Degeneration of lumbar intervertebral disc 01/26/2020   Osteopenia 12/14/2015   Peptic ulcer disease 12/14/2015    ONSET DATE: referral 08/15/2022   REFERRING DIAG: I63.311 (ICD-10-CM) - 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 No   OBJECTIVE:   TODAY'S TREATMENT:  11-02-22: Pt ID that managing her stress and anxiety is likely next best step in optimizing cognitive function and return to baseline. SLP provided education on lifestyle factors which can be efficacious in optimizing brain function: safe exercise, managing stress, eating healthy (such as MIND diet), getting good and consistent sleep, engaging in cognitive demanding and social activities. Encouraged pt to seek help from mental health practitioner if addressing stress and anxiety is priority. SLP led pt through moderately complex divergent naming  task with distraction present (radio at moderate volume). Pt able to name x10 items across x5 trials with rare min-A. Rare anomia evidenced with pt IND employing descriptive strategy sufficient to explain desired target. Results in self-cueing in 80% of attempts.   10-31-22: During conversation, pt reveals implementation and carryover of several strategies targeted during ST course resulting in improved performance in home management tasks, reduced memory mistakes, and management of  cognitive fatigue. Pt able to ID the following strategies and provide examples demonstrating successful carryover: pacing, setting boundaries, using external aids: to-do list, grocery list, asking for assistance (delegation). SLP brings awareness to strategies described and questions if employment of said strategies has been beneficial. Pt with positive response. Ongoing report of challenges arising when distractions are present or during complex tasks. Over next two sessions plan to challenge pt's language skills in complex language task and incorporate distractions during mental task to challenge recall, attention, problem solving with increased complexity.   10-19-22: Pt presents with home work completed, demonstrating good insight into strengths and weaknesses and how each would be affected by choice in complex decision making problem. Reporting was helpful to get out of head onto paper but is still challenging decision to make. Per pt, numerous significant life circumstances at this time which are impacting brain fog and cognitive fatigue. Demonstrates increasing awareness of factors which negatively impact overall cognitive functioning and ability to implement strategies and compensations to cope with onoging challenges. Pt has requested A from sister to manage circumstances and reports to incorporating pacing and delegation, and external aids. Throughout complex discussion of ~20 minutes, pt evidencing no overt anomia/dysnomia. 100% intelligible with speech observed to be within gross normal limits.    10-17-22: Debbie Hatfield endorsing ongoing difficulties with decision making. Upon questioning, reveals challenges lie in ability to process and consider numerous pieces of information at one time or quickly. SLP advises on strategies to aid in decision making with emphasis this date on getting information out of head via writing to faciliate evaluation of all information. With writing, pt can also add information as  she considers complex problems without forgetting information already thought of. Modeled use of pro/con list for numerous potential options for current life decision pt is struggling to make. Encouraged pt to consider consequences for potential options made and potential solutions for cons if able. These should be added to overall written evaluation of complex problem. Pt to attempt strategy use for making decision regarding discussed personal event before next session and will bring back.   10-10-22: SLP led pt through retrospective analysis of recent events, which were complex and challenging, to determine strategies which were beneficial or should be implemented to aid in future improved competencies. Beneficial strategies were in setting clear boundaries, implemented group text messages, asking for A from family members, pacing. In future, pt determines employment of writing things down may be beneficial and setting boundaries sooner would be helpful. 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 Overall going well with rare instances of forgetting "details," such as diet instructions for a specific medication or checking for access to equipment. SLP suggests use of external aid- d/c checklist to aid in comprehensive and complete d/c instructions. Use of checklist can aid in making usre all important areas are discussed, be used to write down questions she would need answers to and be memory aid should she get off track d/t pt and family  questions. Pt to generate list to practice with in upcoming session. SLP suggests modification to grocery list use: check list before getting into checkout line. Pt going to grocery store upon leaving, will refer to list and report back if was benefiical for getting all desired items.   PATIENT EDUCATION: Education details: see above Person educated: Patient Education method:  Medical illustrator Education comprehension: verbalized understanding, returned demonstration, and needs further education   GOALS: Goals reviewed with patient? Yes  SHORT TERM GOALS: Target date: 10/05/2022  Pt will complete standardized cognitive assessment and/or patient reported outcome measure to assess need for cognitive interventions Baseline: Cognitive Function PROM=46 Goal status: MET  2.  Pt will demonstrate auditory processing strategies in structured activities with rare min-A Baseline:  Goal status: MET  3.  Pt will complete moderately complex structured language tasks with 80% accuracy given occasional min-A over 2 sessions Baseline:  Goal status: MET  4.  Pt will demonstrate over-articulation and pacing dysarthria strategies in moderately complex speech tasks with 80% accuracy given rare min-A Baseline:  Goal status: MET   LONG TERM GOALS: Target date: 11/09/2022 (+1 week for recert d/t pt having to miss several sessions d/t personal illness and family matters)  Pt will employ auditory comprehension strategies during 15 minute conversation in busy environment (e.g. therapy gym), resulting in ability to respond appropriately to comments and questions in conversation with rare min-A Baseline:  Goal status: IN PROGRESS  2.  Pt will complete moderately complex structured language tasks with 90% accuracy given rare min-A over 2 sessions Baseline: 11/02/2022 Goal status: IN PROGRESS  3.  Pt will employ anomia compensations in 80% of opportunities, over 10 minute conversation, with rare min-A Baseline: 11/02/2022 Goal status: IN PROGRESS  ASSESSMENT:  CLINICAL IMPRESSION: Patient is a 67 y.o. F who was seen today for cognitive linguistic evaluation s/p stroke. Pt reporting ongoing memory, word finding, and dysarthria. Pt's goal is to return to work if able. Ongoing education and instruction of memory, attention, and processing strategies for accurate  completion of functional household tasks. Pt with increasing success in presence of distractions and is exhibiting rare anomia and demonstration of anomia strategies with mod-I. Skilled ST is indicated to maximize cognition, speech, and language, and facilitate potential return to work.  OBJECTIVE IMPAIRMENTS: include attention, memory, executive functioning, aphasia, and dysarthria. These impairments are limiting patient from return to work, ADLs/IADLs, and effectively communicating at home and in community. Factors affecting potential to achieve goals and functional outcome are  lack of support . Patient will benefit from skilled SLP services to address above impairments and improve overall function.  REHAB POTENTIAL: Good  PLAN:  SLP FREQUENCY: 2x/week  SLP DURATION: 10 weeks  PLANNED INTERVENTIONS: Language facilitation, Cueing hierachy, Cognitive reorganization, Internal/external aids, Oral motor exercises, Functional tasks, SLP instruction and feedback, Compensatory strategies, and Patient/family education  Speech Therapy Progress Note  Dates of Reporting Period: 08/24/2022 to 11/02/2022  Objective Reports of Subjective Statement: Pt has attended 10 ST sessions targeting verbal expression and cognitive skills to optimize return to baseline s/p stroke. Pt endorsing improvements in overall participation in home management tasks and verbal expression through employment of targeted strategies and compensations.   Objective Measurements: Mod-I employment of anomia strategies, rare anomia/dysnomia with 100% awareness, >80% accuracy in moderately complex structured language tasks  Goal Update: see above  Plan: continue per POC  Reason Skilled Services are Required: maximize cognition, speech, and language, and facilitate potential return to work.  Su Monks, [place] 11/02/2022, 9:34 AM

## 2022-11-07 ENCOUNTER — Ambulatory Visit: Payer: Medicare Other | Admitting: Speech Pathology

## 2022-11-07 DIAGNOSIS — R41841 Cognitive communication deficit: Secondary | ICD-10-CM

## 2022-11-07 NOTE — Therapy (Unsigned)
OUTPATIENT SPEECH LANGUAGE PATHOLOGY TREATMENT   Patient Name: Debbie Hatfield MRN: [id number] DOB:1955-10-19, 67 y.o., female Today's Date: 11/07/2022  PCP: Dr. [name] Linger REFERRING PROVIDER: Dr. Despina Arias   End of Session - 11/07/22 1141     Visit Number 11    Number of Visits 17    Date for SLP Re-Evaluation 11/09/22   +1 week for recert   Authorization Type Medicare    Progress Note Due on Visit 10    SLP Start Time 1100    SLP Stop Time  1145    SLP Time Calculation (min) 45 min    Activity Tolerance Patient tolerated treatment well                  Past Medical History:  Diagnosis Date   Closed fracture of right distal radius    Gastroparesis    GERD (gastroesophageal reflux disease)    Hypertension    Irritable bowel disease    Lupus (HCC)    Major depression    Stroke [facility])    Trigeminal neuralgia    Past Surgical History:  Procedure Laterality Date   BUBBLE STUDY  07/05/2022   Procedure: BUBBLE STUDY;  Surgeon: Parke Poisson, MD;  Location: [facility] ENDOSCOPY;  Service: Cardiology;;   CHOLECYSTECTOMY     COLONOSCOPY     LOOP RECORDER IMPLANT  2021   ORIF WRIST FRACTURE Right 09/22/2020   Procedure: OPEN REDUCTION INTERNAL FIXATION (ORIF) WRIST FRACTURE;  Surgeon: Ernest Mallick, MD;  Location: [facility];  Service: Orthopedics;  Laterality: Right;    TEE WITHOUT CARDIOVERSION N/A 07/05/2022   Procedure: TRANSESOPHAGEAL ECHOCARDIOGRAM (TEE);  Surgeon: Parke Poisson, MD;  Location[facility] ENDOSCOPY;  Service: Cardiology;  Laterality: N/A;   Patient Active Problem List   Diagnosis Date Noted   Hordeolum externum of left lower eyelid 10/22/2022   Nonrheumatic mitral valve regurgitation 05/10/2022   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   Anemia, chronic disease 05/06/2022   Hyperlipidemia LDL goal <100 03/26/2022   Recurrent major depressive disorder, in partial remission (HCC) 11/15/2021   Need for shingles vaccine  11/15/2021   Visit for screening mammogram 11/15/2021   Need for vaccination 11/15/2021   GAD (generalized anxiety disorder) 11/15/2021   Stage 3a chronic kidney disease (HCC) 11/15/2021   Trigeminal neuralgia    Lupus (HCC)    Irritable bowel disease    GERD (gastroesophageal reflux disease)    Hypertension    Gastroparesis    Degeneration of lumbar intervertebral disc 01/26/2020   Osteopenia 12/14/2015   Peptic ulcer disease 12/14/2015    ONSET DATE: referral 08/15/2022   REFERRING DIAG: I63.311 (ICD-10-CM) - 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 No   OBJECTIVE:   TODAY'S TREATMENT:   11-07-22: Targeted complex language and cognitive skills in presence of distractions and during multi-tasking. SLP led pt through therapeutic tasks in center's therapy gym to simulate environmental distractions which must be contended with during execution of tasks in community or at work. In complex divergent naming task, pt generates 24/26 items while multi-tasking with distractions present. During verbal reasoning task, explaining medications and their uses, pt with 100% accuracy and mod-I employment of pacing and reorienting to task when  deviation of focus occurred. Pt pleased with performance, feels she managed her attention well. In follow up conversation, pt able to ID x3 strategies which have been efficacious in performing cognitively demanding tasks outside of therapy: writing things down, creating routines, increasing awareness of attention.   11-02-22: Pt ID that managing her stress and anxiety is likely next best step in optimizing cognitive function and return to baseline. SLP provided education on lifestyle factors  which can be efficacious in optimizing brain function: safe exercise, managing stress, eating healthy (such as MIND diet), getting good and consistent sleep, engaging in cognitive demanding and social activities. Encouraged pt to seek help from mental health practitioner if addressing stress and anxiety is priority. SLP led pt through moderately complex divergent naming task with distraction present (radio at moderate volume). Pt able to name x10 items across x5 trials with rare min-A. Rare anomia evidenced with pt IND employing descriptive strategy sufficient to explain desired target. Results in self-cueing in 80% of attempts.   10-31-22: During conversation, pt reveals implementation and carryover of several strategies targeted during ST course resulting in improved performance in home management tasks, reduced memory mistakes, and management of cognitive fatigue. Pt able to ID the following strategies and provide examples demonstrating successful carryover: pacing, setting boundaries, using external aids: to-do list, grocery list, asking for assistance (delegation). SLP brings awareness to strategies described and questions if employment of said strategies has been beneficial. Pt with positive response. Ongoing report of challenges arising when distractions are present or during complex tasks. Over next two sessions plan to challenge pt's language skills in complex language task and incorporate distractions during mental task to challenge recall, attention, problem solving with increased complexity.   10-19-22: Pt presents with home work completed, demonstrating good insight into strengths and weaknesses and how each would be affected by choice in complex decision making problem. Reporting was helpful to get out of head onto paper but is still challenging decision to make. Per pt, numerous significant life circumstances at this time which are impacting brain fog and cognitive fatigue. Demonstrates increasing  awareness of factors which negatively impact overall cognitive functioning and ability to implement strategies and compensations to cope with onoging challenges. Pt has requested A from sister to manage circumstances and reports to incorporating pacing and delegation, and external aids. Throughout complex discussion of ~20 minutes, pt evidencing no overt anomia/dysnomia. 100% intelligible with speech observed to be within gross normal limits.    10-17-22: Wendee endorsing ongoing difficulties with decision making. Upon questioning, reveals challenges lie in ability to process and consider numerous pieces of information at one time or quickly. SLP advises on strategies to aid in decision making with emphasis this date on getting information out of head via writing to faciliate evaluation of all information. With writing, pt can also add information as she considers complex problems without forgetting information already thought of. Modeled use of pro/con list for numerous potential options for current life decision pt is struggling to make. Encouraged pt to consider consequences for potential options made and potential solutions for cons if able. These should be added to overall written evaluation of complex problem. Pt to attempt strategy use for making decision regarding discussed personal event before next session and will bring back.   10-10-22: SLP led pt through retrospective analysis of recent events, which were complex and challenging, to determine strategies which were beneficial or should be implemented to aid in future improved competencies. Beneficial strategies were in setting clear  boundaries, implemented group text messages, asking for A from family members, pacing. In future, pt determines employment of writing things down may be beneficial and setting boundaries sooner would be helpful. Pt reports has been role playing for return to work (practicing d/c instructions) with nieces who are CNAs with  medical information knowledge. Overall going well with rare instances of forgetting "details," such as diet instructions for a specific medication or checking for access to equipment. SLP suggests use of external aid- d/c checklist to aid in comprehensive and complete d/c instructions. Use of checklist can aid in making usre all important areas are discussed, be used to write down questions she would need answers to and be memory aid should she get off track d/t pt and family questions. Pt to generate list to practice with in upcoming session. SLP suggests modification to grocery list use: check list before getting into checkout line. Pt going to grocery store upon leaving, will refer to list and report back if was benefiical for getting all desired items.   PATIENT EDUCATION: Education details: see above Person educated: Patient Education method: Customer service manager Education comprehension: verbalized understanding, returned demonstration, and needs further education   GOALS: Goals reviewed with patient? Yes  SHORT TERM GOALS: Target date: 10/05/2022  Pt will complete standardized cognitive assessment and/or patient reported outcome measure to assess need for cognitive interventions Baseline: Cognitive Function PROM=46 Goal status: MET  2.  Pt will demonstrate auditory processing strategies in structured activities with rare min-A Baseline:  Goal status: MET  3.  Pt will complete moderately complex structured language tasks with 80% accuracy given occasional min-A over 2 sessions Baseline:  Goal status: MET  4.  Pt will demonstrate over-articulation and pacing dysarthria strategies in moderately complex speech tasks with 80% accuracy given rare min-A Baseline:  Goal status: MET   LONG TERM GOALS: Target date: 11/09/2022 (+1 week for recert d/t pt having to miss several sessions d/t personal illness and family matters)  Pt will employ auditory comprehension strategies during  15 minute conversation in busy environment (e.g. therapy gym), resulting in ability to respond appropriately to comments and questions in conversation with rare min-A Baseline:  Goal status: IN PROGRESS  2.  Pt will complete moderately complex structured language tasks with 90% accuracy given rare min-A over 2 sessions Baseline: 11/02/2022 Goal status: IN PROGRESS  3.  Pt will employ anomia compensations in 80% of opportunities, over 10 minute conversation, with rare min-A Baseline: 11/02/2022 Goal status: IN PROGRESS  ASSESSMENT:  CLINICAL IMPRESSION: Patient is a 67 y.o. F who was seen today for cognitive linguistic evaluation s/p stroke. Pt reporting ongoing memory, word finding, and dysarthria. Pt's goal is to return to work if able. Ongoing education and instruction of memory, attention, and processing strategies for accurate completion of functional household tasks. Pt with increasing success in presence of distractions and is exhibiting rare anomia and demonstration of anomia strategies with mod-I. Skilled ST is indicated to maximize cognition, speech, and language, and facilitate potential return to work.  OBJECTIVE IMPAIRMENTS: include attention, memory, executive functioning, aphasia, and dysarthria. These impairments are limiting patient from return to work, ADLs/IADLs, and effectively communicating at home and in community. Factors affecting potential to achieve goals and functional outcome are  lack of support . Patient will benefit from skilled SLP services to address above impairments and improve overall function.  REHAB POTENTIAL: Good  PLAN:  SLP FREQUENCY: 2x/week  SLP DURATION: 10 weeks  PLANNED INTERVENTIONS: Language facilitation,  Cueing hierachy, Cognitive reorganization, Internal/external aids, Oral motor exercises, Functional tasks, SLP instruction and feedback, Compensatory strategies, and Patient/family education  Speech Therapy Progress Note  Dates of  Reporting Period: 08/24/2022 to 11/02/2022  Objective Reports of Subjective Statement: Pt has attended 10 ST sessions targeting verbal expression and cognitive skills to optimize return to baseline s/p stroke. Pt endorsing improvements in overall participation in home management tasks and verbal expression through employment of targeted strategies and compensations.   Objective Measurements: Mod-I employment of anomia strategies, rare anomia/dysnomia with 100% awareness, >80% accuracy in moderately complex structured language tasks  Goal Update: see above  Plan: continue per POC  Reason Skilled Services are Required: maximize cognition, speech, and language, and facilitate potential return to work.   Leroy Libman, [place] 11/07/2022, 11:43 AM

## 2022-11-09 ENCOUNTER — Ambulatory Visit: Payer: Medicare Other | Admitting: Speech Pathology

## 2022-11-09 DIAGNOSIS — R41841 Cognitive communication deficit: Secondary | ICD-10-CM

## 2022-11-09 NOTE — Therapy (Signed)
OUTPATIENT SPEECH LANGUAGE PATHOLOGY TREATMENT   Patient Name: Debbie Hatfield MRN: [id number] DOB:05-Mar-1956, 67 y.o., female Today's Date: 11/09/2022  PCP: Dr. [name] Linger REFERRING PROVIDER: Dr. Despina Arias   End of Session - 11/09/22 1017     Visit Number 12    Number of Visits 17    Date for SLP Re-Evaluation 11/09/22   +1 week for recert   Authorization Type Medicare    Progress Note Due on Visit 10    SLP Start Time 1015    SLP Stop Time  1100    SLP Time Calculation (min) 45 min    Activity Tolerance Patient tolerated treatment well                   Past Medical History:  Diagnosis Date   Closed fracture of right distal radius    Gastroparesis    GERD (gastroesophageal reflux disease)    Hypertension    Irritable bowel disease    Lupus (HCC)    Major depression    Stroke [facility])    Trigeminal neuralgia    Past Surgical History:  Procedure Laterality Date   BUBBLE STUDY  07/05/2022   Procedure: BUBBLE STUDY;  Surgeon: Parke Poisson, MD;  Location: [facility] ENDOSCOPY;  Service: Cardiology;;   CHOLECYSTECTOMY     COLONOSCOPY     LOOP RECORDER IMPLANT  2021   ORIF WRIST FRACTURE Right 09/22/2020   Procedure: OPEN REDUCTION INTERNAL FIXATION (ORIF) WRIST FRACTURE;  Surgeon: Ernest Mallick, MD;  Location: [facility];  Service: Orthopedics;  Laterality: Right;    TEE WITHOUT CARDIOVERSION N/A 07/05/2022   Procedure: TRANSESOPHAGEAL ECHOCARDIOGRAM (TEE);  Surgeon: Parke Poisson, MD;  Location: [facility] ENDOSCOPY;  Service: Cardiology;  Laterality: N/A;   Patient Active Problem List   Diagnosis Date Noted   Hordeolum externum of left lower eyelid 10/22/2022   Nonrheumatic mitral valve regurgitation 05/10/2022   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   Anemia, chronic disease 05/06/2022   Hyperlipidemia LDL goal <100 03/26/2022   Recurrent major depressive disorder, in partial remission (HCC) 11/15/2021   Need for shingles vaccine  11/15/2021   Visit for screening mammogram 11/15/2021   Need for vaccination 11/15/2021   GAD (generalized anxiety disorder) 11/15/2021   Stage 3a chronic kidney disease (HCC) 11/15/2021   Trigeminal neuralgia    Lupus (HCC)    Irritable bowel disease    GERD (gastroesophageal reflux disease)    Hypertension    Gastroparesis    Degeneration of lumbar intervertebral disc 01/26/2020   Osteopenia 12/14/2015   Peptic ulcer disease 12/14/2015    ONSET DATE: referral 08/15/2022   REFERRING DIAG: I63.311 (ICD-10-CM) - 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 No   OBJECTIVE:   TODAY'S TREATMENT:   11-09-22: Pt independently reviewed strategies she has learned and utilized to improve executive function during complex interactions. She discussed the benefits of using a dry erase boards vs a calendar to track daily schedule/appointments. SLP led pt through discourse level task. Pt demonstrated independent implementation of organization and anomia strategies evidenced by cohesive and coherent discourse with no breakdowns due to anomia or dysnomia.   11-07-22: Targeted complex language and  cognitive skills in presence of distractions and during multi-tasking. SLP led pt through therapeutic tasks in center's therapy gym to simulate environmental distractions which must be contended with during execution of tasks in community or at work. In complex divergent naming task, pt generates 24/26 items while multi-tasking with distractions present. During verbal reasoning task, explaining medications and their uses, pt with 100% accuracy and mod-I employment of pacing and reorienting to task when deviation of focus  occurred. Pt pleased with performance, feels she managed her attention well. In follow up conversation, pt able to ID x3 strategies which have been efficacious in performing cognitively demanding tasks outside of therapy: writing things down, creating routines, increasing awareness of attention.   11-02-22: Pt ID that managing her stress and anxiety is likely next best step in optimizing cognitive function and return to baseline. SLP provided education on lifestyle factors which can be efficacious in optimizing brain function: safe exercise, managing stress, eating healthy (such as MIND diet), getting good and consistent sleep, engaging in cognitive demanding and social activities. Encouraged pt to seek help from mental health practitioner if addressing stress and anxiety is priority. SLP led pt through moderately complex divergent naming task with distraction present (radio at moderate volume). Pt able to name x10 items across x5 trials with rare min-A. Rare anomia evidenced with pt IND employing descriptive strategy sufficient to explain desired target. Results in self-cueing in 80% of attempts.   10-31-22: During conversation, pt reveals implementation and carryover of several strategies targeted during ST course resulting in improved performance in home management tasks, reduced memory mistakes, and management of cognitive fatigue. Pt able to ID the following strategies and provide examples demonstrating successful carryover: pacing, setting boundaries, using external aids: to-do list, grocery list, asking for assistance (delegation). SLP brings awareness to strategies described and questions if employment of said strategies has been beneficial. Pt with positive response. Ongoing report of challenges arising when distractions are present or during complex tasks. Over next two sessions plan to challenge pt's language skills in complex language task and incorporate distractions during mental task to challenge  recall, attention, problem solving with increased complexity.   10-19-22: Pt presents with home work completed, demonstrating good insight into strengths and weaknesses and how each would be affected by choice in complex decision making problem. Reporting was helpful to get out of head onto paper but is still challenging decision to make. Per pt, numerous significant life circumstances at this time which are impacting brain fog and cognitive fatigue. Demonstrates increasing awareness of factors which negatively impact overall cognitive functioning and ability to implement strategies and compensations to cope with onoging challenges. Pt has requested A from sister to manage circumstances and reports to incorporating pacing and delegation, and external aids. Throughout complex discussion of ~20 minutes, pt evidencing no overt anomia/dysnomia. 100% intelligible with speech observed to be within gross normal limits.    10-17-22: Benay endorsing ongoing difficulties with decision making. Upon questioning, reveals challenges lie in ability to process and consider numerous pieces of information at one time or quickly. SLP advises on strategies to aid in decision making with emphasis this date on getting information out of head via writing to faciliate evaluation of all information. With writing, pt can also add information as she considers complex problems without forgetting information already thought of. Modeled use of pro/con list for numerous potential options for current life decision pt is struggling to make. Encouraged pt to consider consequences for potential options made and potential  solutions for cons if able. These should be added to overall written evaluation of complex problem. Pt to attempt strategy use for making decision regarding discussed personal event before next session and will bring back.   PATIENT EDUCATION: Education details: see above Person educated: Patient Education method: Data processing manager Education comprehension: verbalized understanding and returned demonstration   GOALS: Goals reviewed with patient? Yes  SHORT TERM GOALS: Target date: 10/05/2022  Pt will complete standardized cognitive assessment and/or patient reported outcome measure to assess need for cognitive interventions Baseline: Cognitive Function PROM=46 Goal status: MET  2.  Pt will demonstrate auditory processing strategies in structured activities with rare min-A Baseline:  Goal status: MET  3.  Pt will complete moderately complex structured language tasks with 80% accuracy given occasional min-A over 2 sessions Baseline:  Goal status: MET  4.  Pt will demonstrate over-articulation and pacing dysarthria strategies in moderately complex speech tasks with 80% accuracy given rare min-A Baseline:  Goal status: MET   LONG TERM GOALS: Target date: 11/09/2022 (+1 week for recert d/t pt having to miss several sessions d/t personal illness and family matters)  Pt will employ auditory comprehension strategies during 15 minute conversation in busy environment (e.g. therapy gym), resulting in ability to respond appropriately to comments and questions in conversation with rare min-A Baseline:  Goal status: MET  2.  Pt will complete moderately complex structured language tasks with 90% accuracy given rare min-A over 2 sessions Baseline: 11/02/2022 Goal status: MET  3.  Pt will employ anomia compensations in 80% of opportunities, over 10 minute conversation, with rare min-A Baseline: 11/02/2022 Goal status: MET  ASSESSMENT:  CLINICAL IMPRESSION: Patient's last scheduled ST visit. Pt demonstrated understanding of previously targeted strategies and compensations via teach back method. Pt able to provide specific examples, demonstrating carryover of strategies and compensations. Discourse level speech within gross functional limits. Pt agreeable to ST d/c. Verbalized appreciation for therapeutic  interventions.   OBJECTIVE IMPAIRMENTS: include attention, memory, executive functioning, aphasia, and dysarthria. These impairments are limiting patient from return to work, ADLs/IADLs, and effectively communicating at home and in community. Factors affecting potential to achieve goals and functional outcome are  lack of support . Patient will benefit from skilled SLP services to address above impairments and improve overall function.  REHAB POTENTIAL: Good  PLAN:  SLP FREQUENCY: 2x/week  SLP DURATION: 10 weeks  PLANNED INTERVENTIONS: Language facilitation, Cueing hierachy, Cognitive reorganization, Internal/external aids, Oral motor exercises, Functional tasks, SLP instruction and feedback, Compensatory strategies, and Patient/family education  SPEECH THERAPY DISCHARGE SUMMARY  Visits from Start of Care: 12  Current functional level related to goals / functional outcomes: Pt has reported increased successful participation in home-based tasks, including complex organization and planning tasks.    Remaining deficits: Pt endorsing mild change from baseline cognition.   Education / Equipment: Metacognitive strategy and instruction, external cognitive aids, memory and attention strategies and compensations, anomia strategies    Patient agrees to discharge. Patient goals were met. Patient is being discharged due to meeting the stated rehab goals.Leroy Libman, [place] 11/09/2022, 11:09 AM

## 2022-11-13 ENCOUNTER — Encounter: Payer: Self-pay | Admitting: Hematology and Oncology

## 2022-11-14 ENCOUNTER — Encounter: Payer: Self-pay | Admitting: Hematology and Oncology

## 2022-11-18 LAB — CUP PACEART REMOTE DEVICE CHECK
Date Time Interrogation Session: 20240203230620
Implantable Pulse Generator Implant Date: 20220728

## 2022-11-19 ENCOUNTER — Ambulatory Visit: Payer: Medicare Other

## 2022-11-19 DIAGNOSIS — R55 Syncope and collapse: Secondary | ICD-10-CM | POA: Diagnosis not present

## 2022-11-20 NOTE — Progress Notes (Signed)
Carelink Summary Report / Loop Recorder 

## 2022-12-14 ENCOUNTER — Other Ambulatory Visit: Payer: Self-pay | Admitting: Internal Medicine

## 2022-12-14 DIAGNOSIS — F3341 Major depressive disorder, recurrent, in partial remission: Secondary | ICD-10-CM

## 2022-12-14 DIAGNOSIS — A6000 Herpesviral infection of urogenital system, unspecified: Secondary | ICD-10-CM

## 2022-12-14 DIAGNOSIS — I10 Essential (primary) hypertension: Secondary | ICD-10-CM

## 2022-12-15 ENCOUNTER — Other Ambulatory Visit: Payer: Self-pay | Admitting: Internal Medicine

## 2022-12-15 DIAGNOSIS — I10 Essential (primary) hypertension: Secondary | ICD-10-CM

## 2022-12-15 DIAGNOSIS — F3341 Major depressive disorder, recurrent, in partial remission: Secondary | ICD-10-CM

## 2022-12-15 DIAGNOSIS — G500 Trigeminal neuralgia: Secondary | ICD-10-CM

## 2022-12-15 DIAGNOSIS — E785 Hyperlipidemia, unspecified: Secondary | ICD-10-CM

## 2022-12-15 DIAGNOSIS — A6000 Herpesviral infection of urogenital system, unspecified: Secondary | ICD-10-CM

## 2022-12-15 MED ORDER — TELMISARTAN-HCTZ 40-12.5 MG PO TABS: 1.0000 | ORAL_TABLET | Freq: Every day | ORAL | 0 refills | Status: DC

## 2022-12-15 MED ORDER — ACYCLOVIR 400 MG PO TABS: 400.0000 mg | ORAL_TABLET | Freq: Every day | ORAL | 1 refills | Status: DC

## 2022-12-15 MED ORDER — ROSUVASTATIN CALCIUM 20 MG PO TABS: 20.0000 mg | ORAL_TABLET | Freq: Every day | ORAL | 1 refills | Status: DC

## 2022-12-15 MED ORDER — VENLAFAXINE HCL ER 37.5 MG PO CP24: 75.0000 mg | ORAL_CAPSULE | Freq: Every day | ORAL | 0 refills | Status: DC

## 2022-12-19 NOTE — Progress Notes (Unsigned)
Office Visit Note  Patient: Debbie Hatfield             Date of Birth: 16-Feb-1956           MRN: [id number]             PCP: Janith Lima, MD Referring: Britt Bottom, MD Visit Date: 01/02/2023 Occupation: @GUAROCC @  Subjective:  Routine follow up   History of Present Illness: Debbie Hatfield is a 67 y.o. female with history of lupus.  Patient is not currently taking any immunosuppressive agents.  Patient was last seen in the office on 09/19/2022 at which time she did not require immunosuppressive therapy.  She followed up with her hematologist Dr. Lorenso Courier on 11/01/2022 at which time lab work was updated to evaluate for antiphospholipid syndrome.  The use of warfarin was not recommended at that time since her beta-2 glycoprotein antibody was slightly lower and the anticardiolipin antibodies and lupus anticoagulant were negative.  She remains on Plavix as prescribed.  She has a follow-up visit with her neurologist Dr. Felecia Shelling next week. Patient reports that she has been experiencing episodic fatigue and total body pain.  During these episodes she experiences generalized arthralgias and joint stiffness.  She has not noticed any joint swelling.  She denies any other symptoms of a systemic lupus flare during these episodes.  She has not had any symptoms of Raynaud's phenomenon, hair loss, oral or nasal ulcerations, or increased sicca symptoms.  She does not experience any malar rash during these episodes.  She states that she has had a couple of these episodes since her last office visit in December 2023.  She states that most of her symptoms seem to be due to myofascial pain.  She continues to have chronic pain in her lower back and has epidural injections every 4 to 6 months.  She is prescribed Ultram by Dr. Herma Mering and takes Tylenol as needed for pain relief.  She is currently going to physical therapy twice a week for left lower extremity muscle strengthening.  She has chronic pain and some weakness in  the left knee and left ankle.  She uses Voltaren gel or IcyHot topically for pain relief.  She has started to notice strengthening in her left ankle.  She has 5 more sessions of physical therapy.  She has been using a TENS unit intermittently for her lower back pain which has been helpful.  She remains on gabapentin as prescribed.  She is been trying to walk 1-1/2 miles daily for exercise.  She also remains active around her house.   Activities of Daily Living:  Patient reports morning stiffness for 2 hours.   Patient Reports nocturnal pain.  Difficulty dressing/grooming: Denies Difficulty climbing stairs: Reports Difficulty getting out of chair: Reports Difficulty using hands for taps, buttons, cutlery, and/or writing: Reports  Review of Systems  Constitutional:  Positive for fatigue.  HENT:  Negative for mouth sores and mouth dryness.   Eyes:  Negative for dryness.  Respiratory:  Negative for shortness of breath.   Cardiovascular:  Negative for chest pain and palpitations.  Gastrointestinal:  Positive for constipation. Negative for blood in stool and diarrhea.  Endocrine: Negative for increased urination.  Genitourinary:  Negative for involuntary urination.  Musculoskeletal:  Positive for joint pain, gait problem, joint pain, joint swelling, myalgias, morning stiffness, muscle tenderness and myalgias. Negative for muscle weakness.  Skin:  Positive for sensitivity to sunlight. Negative for color change, rash and hair loss.  Allergic/Immunologic: Negative for susceptible to infections.  Neurological:  Negative for dizziness and headaches.  Hematological:  Negative for swollen glands.  Psychiatric/Behavioral:  Negative for depressed mood and sleep disturbance. The patient is not nervous/anxious.     PMFS History:  Patient Active Problem List   Diagnosis Date Noted   Hordeolum externum of left lower eyelid 10/22/2022   Nonrheumatic mitral valve regurgitation 05/10/2022   Stroke  (cerebrum) ([place]) 05/07/2022   Anemia, chronic disease 05/06/2022   Hyperlipidemia LDL goal <100 03/26/2022   Recurrent major depressive disorder, in partial remission ([place]) 11/15/2021   Need for shingles vaccine 11/15/2021   Visit for screening mammogram 11/15/2021   Need for vaccination 11/15/2021   GAD (generalized anxiety disorder) 11/15/2021   Stage 3a chronic kidney disease ([place]) 11/15/2021   Trigeminal neuralgia    Lupus (HCC)    Irritable bowel disease    GERD (gastroesophageal reflux disease)    Hypertension    Gastroparesis    Degeneration of lumbar intervertebral disc 01/26/2020   Osteopenia 12/14/2015   Peptic ulcer disease 12/14/2015    Past Medical History:  Diagnosis Date   Closed fracture of right distal radius    Gastroparesis    GERD (gastroesophageal reflux disease)    Hypertension    Irritable bowel disease    Lupus ([place])    Major depression    Stroke ([place])    Trigeminal neuralgia     Family History  Problem Relation Age of Onset   Heart block Mother        Pacemaker   Hypertension Mother    Depression Mother    Gout Mother    Macular degeneration Mother    Aneurysm Mother    Alcoholism Father    Congestive Heart Failure Maternal Grandmother    Diabetes Maternal Grandmother    Congestive Heart Failure Maternal Grandfather    Colon cancer Maternal Grandfather    Stroke Paternal Grandfather    Hypertension Paternal Grandfather    Past Surgical History:  Procedure Laterality Date   BUBBLE STUDY  07/05/2022   Procedure: BUBBLE STUDY;  Surgeon: Elouise Munroe, MD;  Location[facility] ENDOSCOPY;  Service: Cardiology;;   CHOLECYSTECTOMY     COLONOSCOPY     LOOP RECORDER IMPLANT  2021   ORIF WRIST FRACTURE Right 09/22/2020   Procedure: OPEN REDUCTION INTERNAL FIXATION (ORIF) WRIST FRACTURE;  Surgeon: Verner Mould, MD;  Location: [place];  Service: Orthopedics;  Laterality: Right;  19min   TEE WITHOUT CARDIOVERSION N/A  07/05/2022   Procedure: TRANSESOPHAGEAL ECHOCARDIOGRAM (TEE);  Surgeon: Elouise Munroe, MD;  Location: [place];  Service: Cardiology;  Laterality: N/A;   Social History   Social History Narrative   06/13/22 living with her mother   Immunization History  Administered Date(s) Administered   Influenza,inj,Quad PF,6-35 Mos 06/17/2019   Influenza-Unspecified 09/02/2020, 07/27/2021, 07/15/2022   Moderna SARS-COV2 Booster Vaccination 09/02/2020   Moderna Sars-Covid-2 Vaccination 10/23/2019, 11/20/2019   PNEUMOCOCCAL CONJUGATE-20 11/15/2021   Tdap 03/28/2014, 12/29/2020     Objective: Vital Signs: BP 105/65 (BP Location: Left Arm, Patient Position: Sitting, Cuff Size: Normal)   Pulse 73   Resp 15   Ht 5\' 2"  (1.575 m)   Wt 147 lb (66.7 kg)   BMI 26.89 kg/m    Physical Exam Vitals and nursing note reviewed.  Constitutional:      Appearance: She is well-developed.  HENT:     Head: Normocephalic and atraumatic.  Eyes:  Conjunctiva/sclera: Conjunctivae normal.  Cardiovascular:     Rate and Rhythm: Normal rate and regular rhythm.     Heart sounds: Murmur heard.  Pulmonary:     Effort: Pulmonary effort is normal.     Breath sounds: Normal breath sounds.  Abdominal:     General: Bowel sounds are normal.     Palpations: Abdomen is soft.  Musculoskeletal:     Cervical back: Normal range of motion.  Skin:    General: Skin is warm and dry.     Capillary Refill: Capillary refill takes less than 2 seconds.  Neurological:     Mental Status: She is alert and oriented to person, place, and time.  Psychiatric:        Behavior: Behavior normal.      Musculoskeletal Exam: C-spine has good range of motion.  Some trapezius muscle tension and tenderness bilaterally.  Painful range of motion of the lumbar spine.  Shoulder joints, elbow joints, wrist joints, MCPs, PIPs, DIPs have good range of motion with no synovitis.  Complete fist formation bilaterally.  Tenderness over the DIP of  both index fingers.  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  Hip joints have good range of motion with no groin pain.  Some tenderness over the trochanteric bursa bilaterally.  Knee joints have good range of motion with no warmth or effusion.  Ankle joints have good range of motion with no tenderness or synovitis.  CDAI Exam: CDAI Score: -- Patient Global: --; Provider Global: -- Swollen: --; Tender: -- Joint Exam 01/02/2023   No joint exam has been documented for this visit   There is currently no information documented on the homunculus. Go to the Rheumatology activity and complete the homunculus joint exam.  Investigation: No additional findings.  Imaging: CUP PACEART REMOTE DEVICE CHECK  Result Date: 12/26/2022 ILR summary report received. Battery status OK. Normal device function. No new symptom, tachy, brady, or pause episodes. No new AF episodes. Monthly summary reports and ROV/PRN LA   Recent Labs: Lab Results  Component Value Date   WBC 5.6 11/01/2022   HGB 11.6 (L) 11/01/2022   PLT 314 11/01/2022   NA 142 12/25/2022   K 4.2 12/25/2022   CL 101 12/25/2022   CO2 25 12/25/2022   GLUCOSE 117 (H) 12/25/2022   BUN 15 12/25/2022   CREATININE 0.99 12/25/2022   BILITOT 0.4 12/25/2022   ALKPHOS 116 12/25/2022   AST 25 12/25/2022   ALT 19 12/25/2022   PROT 7.3 12/25/2022   ALBUMIN 4.8 12/25/2022   CALCIUM 10.1 12/25/2022   GFRAA >90 07/07/2012   QFTBGOLDPLUS NEGATIVE 06/22/2022    Speciality Comments: Ocular toxicity from Plaquenil use MTX 6 tablets p.o. weekly caused muscle pain Imuran-myalgias  Procedures:  No procedures performed Allergies: Plaquenil [hydroxychloroquine], Sulfa antibiotics, and Penicillins    Assessment / Plan:     Visit Diagnoses: Lupus ([place]) - dxd at age 71.H/o +ANA, fatigue and myalgias.  PLQ discontinued in 2023 due to ocular toxicity:  Lab work from 10/19/2022 was reviewed today in the office: Complements  within normal limits, double-stranded DNA negative, ESR within normal limits.  Labs were not consistent with a systemic lupus flare. Lab work from 11/01/2022 was reviewed today in the office: Lupus anticoagulant not detected, anticardiolipin antibodies negative, and beta-2 glycoprotein IgG was 31.  She does not require the addition of warfarin at this time. She has been experiencing intermittent bouts of fatigue, myalgia, and arthralgias.  She has  not noticed any joint swelling during these episodes.  She has not had any other symptoms of a lupus flare during these episodes.  On examination today she had no synovitis.  No Malar rash was noted.  She has not had any signs of alopecia.  No oral or nasal ulcerations.  No symptoms of Raynaud's phenomenon. She does not require immunosuppressive therapy at this time.  She was advised to notify us if she develops signs or symptoms of a systemic lupus flare.  Future orders were placed today to be rechecked in June 2024.  She will follow-up in the office in 5 months or sooner if needed.  - Plan: Protein / creatinine ratio, urine, COMPLETE METABOLIC PANEL WITH GFR, CBC with Differential/Platelet, ANA, Anti-DNA antibody, double-stranded, C3 and C4, Sedimentation rate, Beta-2 glycoprotein antibodies, Cardiolipin antibodies, IgG, IgM, IgA  High risk medication use - Not currently taking any immunosuppressive agents. Discontinued imuran after 1 dose on 08/04/22 due to tremors and myalgias.  Methotrexate-myalgias.  She does not require immunosuppressive therapy at this time.  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 Evaluated by Dr. Lorenso Courier on 11/01/2022 and the office visit note was reviewed today. Lab work from 11/01/2022 was reviewed today in the office: Lupus anticoagulant not detected,  anticardiolipin antibodies negative, beta-2 glycoprotein IgG 31.  No clinical necessity for warfarin currently.   - Plan: Beta-2 glycoprotein antibodies, Cardiolipin antibodies, IgG, IgM, IgA  Cerebrovascular accident (CVA) due to thrombosis of right middle cerebral artery ([place]): 05/07/2022 with left facial droop and dysarthria.  Symptoms are gradually improving. Under the care of Dr. Felecia Shelling.  She remains on plavix as prescribed.   Currently going to physical therapy for left lower extremity muscle strengthening.  Degeneration of lumbar intervertebral disc: Chronic pain.  Under the care of Dr. Nelva Bush.  She has epidural injections every 4 to 6 months.  She takes Ultram sparingly for pain relief as well as Tylenol as needed.  Trochanteric bursitis, right hip: She has tenderness over bilateral trochanteric bursa, right greater than left.  She has tried dry needling in the past which has been helpful.  Discussed the importance of performing stretching exercises daily.  Chronic pain of both knees: She has good range of motion of both knee joints on examination today.  No warmth or effusion noted.  She is currently going to physical therapy for left lower extremity muscle strengthening.  She experiences intermittent discomfort in the left knee and left ankle.  She has been using Voltaren gel and IcyHot for symptomatic relief.  Myalgia: She experiences intermittent bouts of myalgias and arthralgias as well as fatigue.  Her symptoms seem consistent with myofascial pain.  She may benefit from a referral to negative therapies in the future.  She plans on checking with Dr. Felecia Shelling to see if she will be a candidate for dry needling while taking Plavix.  Other medical conditions are listed as follows:  Primary hypertension: Blood pressure is 105/65 today in the office.  Nonrheumatic mitral valve regurgitation  Stage 3a chronic kidney disease (HCC)  History of gastroesophageal reflux (GERD)  Hyperlipidemia LDL  goal <100  Peptic ulcer disease  Gastroparesis  History of IBS  History of osteopenia  Anemia, chronic disease  Trigeminal neuralgia  GAD (generalized anxiety disorder)  Recurrent major depressive disorder, in partial remission ([place])  Orders: Orders Placed This Encounter  Procedures   Protein / creatinine ratio, urine   COMPLETE METABOLIC PANEL WITH GFR   CBC with Differential/Platelet   ANA   Anti-DNA antibody, double-stranded   C3 and C4   Sedimentation rate   Beta-2 glycoprotein antibodies   Cardiolipin antibodies, IgG, IgM, IgA   No orders of the defined types were placed in this encounter.     Follow-Up Instructions: Return in about 5 months (around 06/04/2023) for Systemic lupus erythematosus.   Ofilia Neas, PA-C  Note - This record has been created using Dragon software.  Chart creation errors have been sought, but may not always  have been located. Such creation errors do not reflect on  the standard of medical care.

## 2022-12-22 NOTE — Progress Notes (Unsigned)
Cardiology Office Note:    Date:  12/22/2022   ID:  Debbie Hatfield, DOB 07/28/1956, MRN [id number]  PCP:  Janith Lima, MD  Cardiologist:  Shirlee More, MD    Referring MD: Janith Lima, MD    ASSESSMENT:    1. PFO (patent foramen ovale)   2. Cerebrovascular accident (CVA) due to thrombosis of right middle cerebral artery ([place])   3. Syncope, unspecified syncope type   4. Primary hypertension   5. Hyperlipidemia LDL goal <100    PLAN:    In order of problems listed above:  ***   Next appointment: ***   Medication Adjustments/Labs and Tests Ordered: Current medicines are reviewed at length with the patient today.  Concerns regarding medicines are outlined above.  No orders of the defined types were placed in this encounter.  No orders of the defined types were placed in this encounter.   No chief complaint on file.   History of Present Illness:    Debbie Hatfield is a 67 y.o. female with a hx of hypertension hyperlipidemia and stroke last seen 06/25/2022 following a profound syncopal episode.  With stroke she underwent a transesophageal echocardiogram which showed the presence of patent foramen ovale and right to left shunting.  She was seen by structural heart was felt to have a low risk PFO ongoing medical therapy was recommended as opposed to transcatheter PFO closure. Compliance with diet, lifestyle and medications: *** Past Medical History:  Diagnosis Date   Closed fracture of right distal radius    Gastroparesis    GERD (gastroesophageal reflux disease)    Hypertension    Irritable bowel disease    Lupus ([place])    Major depression    Stroke [facility])    Trigeminal neuralgia     Past Surgical History:  Procedure Laterality Date   BUBBLE STUDY  07/05/2022   Procedure: BUBBLE STUDY;  Surgeon: Elouise Munroe, MD;  Location: [place];  Service: Cardiology;;   CHOLECYSTECTOMY     COLONOSCOPY     LOOP RECORDER IMPLANT  2021   ORIF WRIST FRACTURE Right  09/22/2020   Procedure: OPEN REDUCTION INTERNAL FIXATION (ORIF) WRIST FRACTURE;  Surgeon: Verner Mould, MD;  Location: [place];  Service: Orthopedics;  Laterality: Right;  57mn   TEE WITHOUT CARDIOVERSION N/A 07/05/2022   Procedure: TRANSESOPHAGEAL ECHOCARDIOGRAM (TEE);  Surgeon: AElouise Munroe MD;  [place]  Service: Cardiology;  Laterality: N/A;    Current Medications: No outpatient medications have been marked as taking for the 12/24/22 encounter (Appointment) with MRichardo Priest MD.     Allergies:   Plaquenil [hydroxychloroquine], Sulfa antibiotics, and Penicillins   Social History   Socioeconomic History   Marital status: Widowed    Spouse name: Not on file   Number of children: 0   Years of education: Not on file   Highest education level: Bachelor's degree (e.g., BA, AB, BS)  Occupational History    Comment: RN ortho/neuro at [employer] Tobacco Use   Smoking status: Never    Passive exposure: Never   Smokeless tobacco: Never  Vaping Use   Vaping Use: Never used  Substance and Sexual Activity   Alcohol use: Yes    Comment: social   Drug use: Never   Sexual activity: Not Currently    Birth control/protection: Post-menopausal  Other Topics Concern   Not on file  Social History Narrative   06/13/22 living with her mother  Social Determinants of Health   Financial Resource Strain: Low Risk  (10/24/2022)   Overall Financial Resource Strain (CARDIA)    Difficulty of Paying Living Expenses: Not hard at all  Food Insecurity: No Food Insecurity (10/24/2022)   Hunger Vital Sign    Worried About Running Out of Food in the Last Year: Never true    Ran Out of Food in the Last Year: Never true  Transportation Needs: No Transportation Needs (10/24/2022)   PRAPARE - Hydrologist (Medical): No    Lack of Transportation (Non-Medical): No  Physical Activity: Inactive (10/24/2022)   Exercise Vital Sign    Days  of Exercise per Week: 0 days    Minutes of Exercise per Session: 0 min  Stress: No Stress Concern Present (10/24/2022)   [place]    Feeling of Stress : Only a little  Social Connections: Moderately Integrated (10/24/2022)   Social Connection and Isolation Panel [NHANES]    Frequency of Communication with Friends and Family: More than three times a week    Frequency of Social Gatherings with Friends and Family: Three times a week    Attends Religious Services: More than 4 times per year    Active Member of Clubs or Organizations: Yes    Attends Archivist Meetings: More than 4 times per year    Marital Status: Widowed     Family History: The patient's ***family history includes Alcoholism in her father; Aneurysm in her mother; Colon cancer in her maternal grandfather; Congestive Heart Failure in her maternal grandfather and maternal grandmother; Depression in her mother; Diabetes in her maternal grandmother; Gout in her mother; Heart block in her mother; Hypertension in her mother and paternal grandfather; Macular degeneration in her mother; Stroke in her paternal grandfather. ROS:   Please see the history of present illness.    All other systems reviewed and are negative.  EKGs/Labs/Other Studies Reviewed:    The following studies were reviewed today:  Cardiac Studies & Procedures     STRESS TESTS  MYOCARDIAL PERFUSION IMAGING 06/12/2022  Narrative   The study is normal. The study is low risk.   No ST deviation was noted.   Left ventricular function is normal. Nuclear stress EF: 72 %. The left ventricular ejection fraction is hyperdynamic (>65%). End diastolic cavity size is normal.   Prior study not available for comparison.   ECHOCARDIOGRAM  ECHOCARDIOGRAM COMPLETE 05/07/2022  Narrative ECHOCARDIOGRAM REPORT    Patient Name:   Debbie Hatfield Date of Exam: 05/07/2022 Medical Rec #:  [id number]       Height:       62.0 in Accession #:    [id number]     Weight:       145.1 lb Date of Birth:  24-Mar-1956       BSA:          1.668 m Patient Age:    66 years       BP:           167/80 mmHg Patient Gender: F              HR:           57 bpm. Exam Location:  Inpatient  Procedure: 2D Echo, Cardiac Doppler and Color Doppler  Indications:    Stroke I63.9  History:        Patient has prior history of Echocardiogram examinations, most recent 01/31/2021.  Risk Factors:Hypertension.  Sonographer:    Ronny Flurry Sonographer#2:  Bernadene Person RDCS Referring Phys: [id number] [place]   1. Left ventricular ejection fraction, by estimation, is 60 to 65%. The left ventricle has normal function. The left ventricle has no regional wall motion abnormalities. Left ventricular diastolic parameters were normal. 2. Right ventricular systolic function is normal. The right ventricular size is normal. There is normal pulmonary artery systolic pressure. 3. Left atrial size was mildly dilated. 4. The mitral valve is abnormal. Mild mitral valve regurgitation. No evidence of mitral stenosis. 5. The aortic valve is tricuspid. There is mild calcification of the aortic valve. There is mild thickening of the aortic valve. Aortic valve regurgitation is trivial. Aortic valve sclerosis is present, with no evidence of aortic valve stenosis. 6. The inferior vena cava is normal in size with greater than 50% respiratory variability, suggesting right atrial pressure of 3 mmHg.  FINDINGS Left Ventricle: Left ventricular ejection fraction, by estimation, is 60 to 65%. The left ventricle has normal function. The left ventricle has no regional wall motion abnormalities. The left ventricular internal cavity size was normal in size. There is no left ventricular hypertrophy. Left ventricular diastolic parameters were normal.  Right Ventricle: The right ventricular size is normal. No increase in right ventricular  wall thickness. Right ventricular systolic function is normal. There is normal pulmonary artery systolic pressure. The tricuspid regurgitant velocity is 2.20 m/s, and with an assumed right atrial pressure of 3 mmHg, the estimated right ventricular systolic pressure is [id number] mmHg.  Left Atrium: Left atrial size was mildly dilated.  Right Atrium: Right atrial size was normal in size.  Pericardium: There is no evidence of pericardial effusion.  Mitral Valve: The mitral valve is abnormal. There is mild thickening of the mitral valve leaflet(s). There is mild calcification of the mitral valve leaflet(s). Mild mitral annular calcification. Mild mitral valve regurgitation. No evidence of mitral valve stenosis.  Tricuspid Valve: The tricuspid valve is normal in structure. Tricuspid valve regurgitation is mild . No evidence of tricuspid stenosis.  Aortic Valve: The aortic valve is tricuspid. There is mild calcification of the aortic valve. There is mild thickening of the aortic valve. Aortic valve regurgitation is trivial. Aortic valve sclerosis is present, with no evidence of aortic valve stenosis.  Pulmonic Valve: The pulmonic valve was normal in structure. Pulmonic valve regurgitation is not visualized. No evidence of pulmonic stenosis.  Aorta: The aortic root is normal in size and structure.  Venous: The inferior vena cava is normal in size with greater than 50% respiratory variability, suggesting right atrial pressure of 3 mmHg.  IAS/Shunts: No atrial level shunt detected by color flow Doppler.   LEFT VENTRICLE PLAX 2D LVIDd:         4.00 cm     Diastology LVIDs:         2.20 cm     LV e' medial:    6.57 cm/s LV PW:         1.00 cm     LV E/e' medial:  13.4 LV IVS:        0.80 cm     LV e' lateral:   7.98 cm/s LVOT diam:     1.90 cm     LV E/e' lateral: 11.0 LV SV:         72 LV SV Index:   43 LVOT Area:     2.84 cm  LV Volumes (MOD) LV vol d, MOD A2C: 83.2  ml LV vol d, MOD A4C:  83.7 ml LV vol s, MOD A2C: 30.9 ml LV vol s, MOD A4C: 33.3 ml LV SV MOD A2C:     52.3 ml LV SV MOD A4C:     83.7 ml LV SV MOD BP:      50.3 ml  RIGHT VENTRICLE RV S prime:     11.90 cm/s TAPSE (M-mode): 2.0 cm  LEFT ATRIUM             Index        RIGHT ATRIUM           Index LA diam:        3.80 cm 2.28 cm/m   RA Area:     10.10 cm LA Vol (A2C):   56.5 ml 33.88 ml/m  RA Volume:   18.40 ml  11.03 ml/m LA Vol (A4C):   39.3 ml 23.56 ml/m LA Biplane Vol: 49.4 ml 29.62 ml/m AORTIC VALVE LVOT Vmax:   96.30 cm/s LVOT Vmean:  71.700 cm/s LVOT VTI:    0.254 m  AORTA Ao Root diam: 3.00 cm Ao Asc diam:  3.00 cm  MITRAL VALVE               TRICUSPID VALVE MV Area (PHT): 3.31 cm    TR Peak grad:   19.4 mmHg MV Decel Time: 229 msec    TR Vmax:        220.00 cm/s MV E velocity: 88.10 cm/s MV A velocity: 96.70 cm/s  SHUNTS MV E/A ratio:  0.91        Systemic VTI:  0.25 m Systemic Diam: 1.90 cm  Jenkins Rouge MD Electronically signed by Jenkins Rouge MD Signature Date/Time: 05/07/2022/11:50:43 AM    Final   TEE  ECHO TEE 07/05/2022  Narrative TRANSESOPHOGEAL ECHO REPORT    Patient Name:   EIRENE LLERA Date of Exam: 07/05/2022 Medical Rec #:  [id number]      Height:       62.0 in Accession #:    [id number]     Weight:       145.1 lb Date of Birth:  May 14, 1956       BSA:          1.668 m Patient Age:    16 years       BP:           164/72 mmHg Patient Gender: F              HR:           90 bpm. Exam Location:  Inpatient  Procedure: Transesophageal Echo, Color Doppler, Cardiac Doppler and Saline Contrast Bubble Study  Indications:     Stroke i63.9  History:         Patient has prior history of Echocardiogram examinations, most recent 05/07/2022. Risk Factors:Hypertension and Dyslipidemia.  Sonographer:     Raquel Sarna Senior RDCS Referring Phys:  Shirlee More, J Diagnosing Phys: Cherlynn Kaiser MD  PROCEDURE: After discussion of the risks and benefits of a TEE, an  informed consent was obtained from the patient. TEE procedure time was 16 minutes. The transesophogeal probe was passed without difficulty through the esophogus of the patient. Imaged were obtained with the patient in a left lateral decubitus position. Local oropharyngeal anesthetic was provided with Cetacaine. Sedation performed by different physician. The patient was monitored while under deep sedation. Anesthestetic sedation was provided intravenously by Anesthesiology: '335mg'$  of Propofol. Image quality was excellent. The patient's  vital signs; including heart rate, blood pressure, and oxygen saturation; remained stable throughout the procedure. The patient developed no complications during the procedure.  IMPRESSIONS   1. Left ventricular ejection fraction, by estimation, is 65 to 70%. The left ventricle has normal function. The left ventricle has no regional wall motion abnormalities. 2. Right ventricular systolic function is normal. The right ventricular size is normal. 3. Left atrial size was mildly dilated. No left atrial/left atrial appendage thrombus was detected. The LAA emptying velocity was 95 cm/s. 4. A small pericardial effusion is present. 5. The mitral valve is normal in structure. Trivial mitral valve regurgitation. No evidence of mitral stenosis. 6. The aortic valve is tricuspid. There is mild calcification of the aortic valve. Aortic valve regurgitation is trivial. Mild aortic valve stenosis. Aortic valve mean gradient measures 12.0 mmHg. 7. There is mild (Grade II) atheroma plaque involving the aortic arch and descending aorta. 8. Evidence of atrial level shunting detected by color flow Doppler. Agitated saline contrast bubble study was positive with shunting observed within 3-6 cardiac cycles suggestive of interatrial shunt. There is a small patent foramen ovale with bidirectional shunting across atrial septum.  FINDINGS Left Ventricle: Left ventricular ejection fraction, by  estimation, is 65 to 70%. The left ventricle has normal function. The left ventricle has no regional wall motion abnormalities. The left ventricular internal cavity size was normal in size.  Right Ventricle: The right ventricular size is normal. No increase in right ventricular wall thickness. Right ventricular systolic function is normal.  Left Atrium: Left atrial size was mildly dilated. No left atrial/left atrial appendage thrombus was detected. The LAA emptying velocity was 95 cm/s.  Right Atrium: Right atrial size was normal in size.  Pericardium: A small pericardial effusion is present.  Mitral Valve: The mitral valve is normal in structure. Trivial mitral valve regurgitation. No evidence of mitral valve stenosis.  Tricuspid Valve: The tricuspid valve is normal in structure. Tricuspid valve regurgitation is trivial. No evidence of tricuspid stenosis.  Aortic Valve: The aortic valve is tricuspid. There is mild calcification of the aortic valve. Aortic valve regurgitation is trivial. Mild aortic stenosis is present. Aortic valve mean gradient measures 12.0 mmHg. Aortic valve peak gradient measures 17.2 mmHg. Aortic valve area, by VTI measures 1.83 cm.  Pulmonic Valve: The pulmonic valve was normal in structure. Pulmonic valve regurgitation is trivial. No evidence of pulmonic stenosis.  Aorta: The aortic root and ascending aorta are structurally normal, with no evidence of dilitation. There is mild (Grade II) atheroma plaque involving the aortic arch and descending aorta.  IAS/Shunts: There is redundancy of the interatrial septum. Evidence of atrial level shunting detected by color flow Doppler. Agitated saline contrast was given intravenously to evaluate for intracardiac shunting. Agitated saline contrast bubble study was positive with shunting observed within 3-6 cardiac cycles suggestive of interatrial shunt. A small patent foramen ovale is detected with bidirectional shunting across  atrial septum.   LEFT VENTRICLE PLAX 2D LVOT diam:     2.00 cm LV SV:         78 LV SV Index:   47 LVOT Area:     3.14 cm   AORTIC VALVE AV Area (Vmax):    1.60 cm AV Area (Vmean):   1.70 cm AV Area (VTI):     1.83 cm AV Vmax:           207.50 cm/s AV Vmean:          156.000 cm/s AV VTI:  0.423 m AV Peak Grad:      17.2 mmHg AV Mean Grad:      12.0 mmHg LVOT Vmax:         106.00 cm/s LVOT Vmean:        84.600 cm/s LVOT VTI:          0.247 m LVOT/AV VTI ratio: 0.58  AORTA Ao Root diam: 3.00 cm Ao Asc diam:  3.00 cm   SHUNTS Systemic VTI:  0.25 m Systemic Diam: 2.00 cm  Cherlynn Kaiser MD Electronically signed by Cherlynn Kaiser MD Signature Date/Time: 07/05/2022/1:28:03 PM    Final   MONITORS  LONG TERM MONITOR-LIVE TELEMETRY (3-14 DAYS) 02/18/2021  Narrative Patch Wear Time:  14 days and 0 hours (2022-03-31T10:07:46-0400 to 2022-04-14T10:07:46-0400)  Patient had a min HR of 53 bpm, max HR of 185 bpm, and avg HR of 83 bpm. Predominant underlying rhythm was Sinus Rhythm. First Degree AV Block was present. 8 Supraventricular Tachycardia runs occurred, the run with the fastest interval lasting 5 beats with a max rate of 185 bpm, the longest lasting 9 beats with an avg rate of 129 bpm. Supraventricular Tachycardia was detected within +/- 45 seconds of symptomatic patient event(s). Isolated SVEs were rare (<1.0%), SVE Couplets were rare (<1.0%), and SVE Triplets were rare (<1.0%). Isolated VEs were rare (<1.0%, 1579), VE Couplets were rare (<1.0%, 1), and VE Triplets were rare (<1.0%, 1).  There were no pauses of 3 seconds or greater and no episodes of second or third-degree AV block.  Ventricular ectopy was rare with 1 couplet and 1 triplet.  Supraventricular ectopy was rare with no episodes of atrial fibrillation or flutter.  There were 8 brief runs of APCs, atrial tachycardia the longest 9 complexes rate of 129 bpm.  There were 19 triggered 11 diary  events all associated with sinus rhythm sinus tachycardia.  None of the episodes were associated with occasional to frequent PVCs 3 the episodes with APCs.   Conclusion, first-degree AV block, rare ventricular and supraventricular ectopy however symptomatic events were predominantly associated with PVCs and APCs.           EKG:  EKG ordered today and personally reviewed.  The ekg ordered today demonstrates ***  Recent Labs: 11/01/2022: ALT 18; BUN 20; Creatinine 1.00; Hemoglobin 11.6; Platelet Count 314; Potassium 3.9; Sodium 141  Recent Lipid Panel    Component Value Date/Time   CHOL 238 (H) 05/08/2022 0424   TRIG 119 05/08/2022 0424   HDL 109 05/08/2022 0424   CHOLHDL 2.2 05/08/2022 0424   VLDL 24 05/08/2022 0424   LDLCALC 105 (H) 05/08/2022 0424    Physical Exam:    VS:  There were no vitals taken for this visit.    Wt Readings from Last 3 Encounters:  11/01/22 153 lb 12.8 oz (69.8 kg)  10/24/22 151 lb (68.5 kg)  10/22/22 151 lb (68.5 kg)     GEN: *** Well nourished, well developed in no acute distress HEENT: Normal NECK: No JVD; No carotid bruits LYMPHATICS: No lymphadenopathy CARDIAC: ***RRR, no murmurs, rubs, gallops RESPIRATORY:  Clear to auscultation without rales, wheezing or rhonchi  ABDOMEN: Soft, non-tender, non-distended MUSCULOSKELETAL:  No edema; No deformity  SKIN: Warm and dry NEUROLOGIC:  Alert and oriented x 3 PSYCHIATRIC:  Normal affect    Signed, Shirlee More, MD  12/22/2022 12:47 PM    [place]

## 2022-12-24 ENCOUNTER — Ambulatory Visit: Payer: Medicare Other | Attending: Cardiology | Admitting: Cardiology

## 2022-12-24 ENCOUNTER — Ambulatory Visit (INDEPENDENT_AMBULATORY_CARE_PROVIDER_SITE_OTHER): Payer: Medicare Other

## 2022-12-24 ENCOUNTER — Encounter: Payer: Self-pay | Admitting: Cardiology

## 2022-12-24 VITALS — BP 146/60 | HR 65 | Ht 62.0 in | Wt 148.2 lb

## 2022-12-24 DIAGNOSIS — I10 Essential (primary) hypertension: Secondary | ICD-10-CM | POA: Diagnosis present

## 2022-12-24 DIAGNOSIS — R55 Syncope and collapse: Secondary | ICD-10-CM | POA: Diagnosis present

## 2022-12-24 DIAGNOSIS — E785 Hyperlipidemia, unspecified: Secondary | ICD-10-CM | POA: Insufficient documentation

## 2022-12-24 DIAGNOSIS — Q2112 Patent foramen ovale: Secondary | ICD-10-CM | POA: Insufficient documentation

## 2022-12-24 DIAGNOSIS — I63311 Cerebral infarction due to thrombosis of right middle cerebral artery: Secondary | ICD-10-CM | POA: Insufficient documentation

## 2022-12-24 NOTE — Patient Instructions (Signed)
Medication Instructions:  Your physician recommends that you continue on your current medications as directed. Please refer to the Current Medication list given to you today.  *If you need a refill on your cardiac medications before your next appointment, please call your pharmacy*   Lab Work: Your physician recommends that you return for lab work in:   Labs today: CMP, Lipids   If you have labs (blood work) drawn today and your tests are completely normal, you will receive your results only by: [place] (if you have [place]) OR A paper copy in the mail If you have any lab test that is abnormal or we need to change your treatment, we will call you to review the results.   Testing/Procedures: None   Follow-Up: At [facility], you and your health needs are our priority.  As part of our continuing mission to provide you with exceptional heart care, we have created designated Provider Care Teams.  These Care Teams include your primary Cardiologist (physician) and Advanced Practice Providers (APPs -  Physician Assistants and Nurse Practitioners) who all work together to provide you with the care you need, when you need it.  We recommend signing up for the patient portal called "MyChart".  Sign up information is provided on this After Visit Summary.  MyChart is used to connect with patients for Virtual Visits (Telemedicine).  Patients are able to view lab/test results, encounter notes, upcoming appointments, etc.  Non-urgent messages can be sent to your provider as well.   To learn more about what you can do with MyChart, go to [email or web address].    Your next appointment:   9 month(s)  Provider:   Shirlee More, MD    Other Instructions None

## 2022-12-26 LAB — CUP PACEART REMOTE DEVICE CHECK
Date Time Interrogation Session: 20240310230747
Implantable Pulse Generator Implant Date: 20220728

## 2022-12-26 LAB — COMPREHENSIVE METABOLIC PANEL
ALT: 19 IU/L (ref 0–32)
AST: 25 IU/L (ref 0–40)
Albumin/Globulin Ratio: 1.9 (ref 1.2–2.2)
Albumin: 4.8 g/dL (ref 3.9–4.9)
Alkaline Phosphatase: 116 IU/L (ref 44–121)
BUN/Creatinine Ratio: 15 (ref 12–28)
BUN: 15 mg/dL (ref 8–27)
Bilirubin Total: 0.4 mg/dL (ref 0.0–1.2)
CO2: 25 mmol/L (ref 20–29)
Calcium: 10.1 mg/dL (ref 8.7–10.3)
Chloride: 101 mmol/L (ref 96–106)
Creatinine, Ser: 0.99 mg/dL (ref 0.57–1.00)
Globulin, Total: 2.5 g/dL (ref 1.5–4.5)
Glucose: 117 mg/dL — ABNORMAL HIGH (ref 70–99)
Potassium: 4.2 mmol/L (ref 3.5–5.2)
Sodium: 142 mmol/L (ref 134–144)
Total Protein: 7.3 g/dL (ref 6.0–8.5)
eGFR: 63 mL/min/{1.73_m2} (ref >59)

## 2022-12-26 LAB — LIPID PANEL
Chol/HDL Ratio: 1.6 ratio (ref 0.0–4.4)
Cholesterol, Total: 164 mg/dL (ref 100–199)
HDL: 103 mg/dL (ref >39)
LDL Chol Calc (NIH): 46 mg/dL (ref 0–99)
Triglycerides: 84 mg/dL (ref 0–149)
VLDL Cholesterol Cal: 15 mg/dL (ref 5–40)

## 2023-01-02 ENCOUNTER — Ambulatory Visit: Payer: Medicare Other | Attending: Physician Assistant | Admitting: Physician Assistant

## 2023-01-02 ENCOUNTER — Encounter: Payer: Self-pay | Admitting: Physician Assistant

## 2023-01-02 VITALS — BP 105/65 | HR 73 | Resp 15 | Ht 62.0 in | Wt 147.0 lb

## 2023-01-02 DIAGNOSIS — M25561 Pain in right knee: Secondary | ICD-10-CM | POA: Diagnosis present

## 2023-01-02 DIAGNOSIS — R760 Raised antibody titer: Secondary | ICD-10-CM | POA: Diagnosis present

## 2023-01-02 DIAGNOSIS — I10 Essential (primary) hypertension: Secondary | ICD-10-CM | POA: Insufficient documentation

## 2023-01-02 DIAGNOSIS — M7061 Trochanteric bursitis, right hip: Secondary | ICD-10-CM | POA: Diagnosis present

## 2023-01-02 DIAGNOSIS — M5136 Other intervertebral disc degeneration, lumbar region: Secondary | ICD-10-CM | POA: Insufficient documentation

## 2023-01-02 DIAGNOSIS — H35389 Toxic maculopathy, unspecified eye: Secondary | ICD-10-CM

## 2023-01-02 DIAGNOSIS — G500 Trigeminal neuralgia: Secondary | ICD-10-CM

## 2023-01-02 DIAGNOSIS — M791 Myalgia, unspecified site: Secondary | ICD-10-CM | POA: Diagnosis present

## 2023-01-02 DIAGNOSIS — T372X5A Adverse effect of antimalarials and drugs acting on other blood protozoa, initial encounter: Secondary | ICD-10-CM | POA: Diagnosis present

## 2023-01-02 DIAGNOSIS — G8929 Other chronic pain: Secondary | ICD-10-CM | POA: Diagnosis present

## 2023-01-02 DIAGNOSIS — I63311 Cerebral infarction due to thrombosis of right middle cerebral artery: Secondary | ICD-10-CM | POA: Diagnosis present

## 2023-01-02 DIAGNOSIS — Z79899 Other long term (current) drug therapy: Secondary | ICD-10-CM | POA: Insufficient documentation

## 2023-01-02 DIAGNOSIS — E785 Hyperlipidemia, unspecified: Secondary | ICD-10-CM | POA: Diagnosis present

## 2023-01-02 DIAGNOSIS — F3341 Major depressive disorder, recurrent, in partial remission: Secondary | ICD-10-CM | POA: Insufficient documentation

## 2023-01-02 DIAGNOSIS — F411 Generalized anxiety disorder: Secondary | ICD-10-CM | POA: Insufficient documentation

## 2023-01-02 DIAGNOSIS — I340 Nonrheumatic mitral (valve) insufficiency: Secondary | ICD-10-CM

## 2023-01-02 DIAGNOSIS — M25562 Pain in left knee: Secondary | ICD-10-CM

## 2023-01-02 DIAGNOSIS — M329 Systemic lupus erythematosus, unspecified: Secondary | ICD-10-CM | POA: Insufficient documentation

## 2023-01-02 DIAGNOSIS — K3184 Gastroparesis: Secondary | ICD-10-CM | POA: Diagnosis present

## 2023-01-02 DIAGNOSIS — D638 Anemia in other chronic diseases classified elsewhere: Secondary | ICD-10-CM | POA: Insufficient documentation

## 2023-01-02 DIAGNOSIS — K279 Peptic ulcer, site unspecified, unspecified as acute or chronic, without hemorrhage or perforation: Secondary | ICD-10-CM | POA: Insufficient documentation

## 2023-01-02 DIAGNOSIS — Z8739 Personal history of other diseases of the musculoskeletal system and connective tissue: Secondary | ICD-10-CM

## 2023-01-02 DIAGNOSIS — Z8719 Personal history of other diseases of the digestive system: Secondary | ICD-10-CM | POA: Insufficient documentation

## 2023-01-02 DIAGNOSIS — N1831 Chronic kidney disease, stage 3a: Secondary | ICD-10-CM | POA: Insufficient documentation

## 2023-01-02 NOTE — Patient Instructions (Signed)
Standing Labs We placed an order today for your standing lab work.   Please have your standing labs drawn in June    Please have your labs drawn 2 weeks prior to your appointment so that the provider can discuss your lab results at your appointment, if possible.  Please note that you may see your imaging and lab results in [place] before we have reviewed them. We will contact you once all results are reviewed. Please allow our office up to 72 hours to thoroughly review all of the results before contacting the office for clarification of your results.  WALK-IN LAB HOURS  Monday through Thursday from 8:00 am -12:30 pm and 1:00 pm-5:00 pm and Friday from 8:00 am-12:00 pm.  Patients with office visits requiring labs will be seen before walk-in labs.  You may encounter longer than normal wait times. Please allow additional time. Wait times may be shorter on  Monday and Thursday afternoons.  We do not book appointments for walk-in labs. We appreciate your patience and understanding with our staff.   Labs are drawn by [facility]. Please bring your co-pay at the time of your lab draw.  You may receive a bill from [facility] for your lab work.  Please note if you are on Hydroxychloroquine and and an order has been placed for a Hydroxychloroquine level,  you will need to have it drawn 4 hours or more after your last dose.  If you wish to have your labs drawn at another location, please call the office 24 hours in advance so we can fax the orders.  The office is located at [address] [place], [place], [place] [address]   If you have any questions regarding directions or hours of operation,  please call [phone].   As a reminder, please drink plenty of water prior to coming for your lab work. Thanks!

## 2023-01-03 NOTE — Progress Notes (Signed)
Carelink Summary Report / Loop Recorder 

## 2023-01-21 ENCOUNTER — Ambulatory Visit (INDEPENDENT_AMBULATORY_CARE_PROVIDER_SITE_OTHER): Payer: Medicare Other | Admitting: Internal Medicine

## 2023-01-21 ENCOUNTER — Encounter: Payer: Self-pay | Admitting: Internal Medicine

## 2023-01-21 VITALS — BP 138/68 | HR 75 | Temp 98.3°F | Resp 16 | Ht 62.0 in | Wt 145.0 lb

## 2023-01-21 DIAGNOSIS — D638 Anemia in other chronic diseases classified elsewhere: Secondary | ICD-10-CM

## 2023-01-21 DIAGNOSIS — G500 Trigeminal neuralgia: Secondary | ICD-10-CM

## 2023-01-21 DIAGNOSIS — I10 Essential (primary) hypertension: Secondary | ICD-10-CM

## 2023-01-21 LAB — TSH: TSH: 2.11 u[IU]/mL (ref 0.35–5.50)

## 2023-01-21 MED ORDER — TELMISARTAN-HCTZ 40-12.5 MG PO TABS: 1.0000 | ORAL_TABLET | Freq: Every day | ORAL | 0 refills | Status: DC

## 2023-01-21 MED ORDER — CARBAMAZEPINE ER 300 MG PO CP12: ORAL_CAPSULE | ORAL | 0 refills | Status: DC

## 2023-01-21 NOTE — Progress Notes (Signed)
Subjective:  Patient ID: Debbie Hatfield, female    DOB: 04/16/1956  Age: 67 y.o. MRN: [id number]  CC: Hypertension   HPI Debbie Chanceeresa R Manley presents for f/up --  She has been active and denies DOE, CP, edema, SOB, or new neuro symptoms.  Outpatient Medications Prior to Visit  Medication Sig Dispense Refill   acetaminophen (TYLENOL) 325 MG tablet Take 650 mg by mouth as needed.     acyclovir (ZOVIRAX) 400 MG tablet Take 1 tablet (400 mg total) by mouth daily. 90 tablet 1   aspirin EC 81 MG tablet Take 1 tablet (81 mg total) by mouth daily. Swallow whole. (Patient taking differently: Take 81 mg by mouth at bedtime. Swallow whole.) 30 tablet 12   baclofen (LIORESAL) 10 MG tablet Take 1 tablet (10 mg total) by mouth 3 (three) times daily. (Patient taking differently: Take 10 mg by mouth as needed.) 90 each 5   clopidogrel (PLAVIX) 75 MG tablet Take 1 tablet (75 mg total) by mouth daily. 30 tablet 11   diclofenac Sodium (VOLTAREN) 1 % GEL APPLY 2 GRAMS TO THE AFFECTED AREA(S) BY TOPICAL ROUTE 2-3 TIMES PER DAY     gabapentin (NEURONTIN) 300 MG capsule Take 600 mg by mouth 2 (two) times daily.     hydrOXYzine (ATARAX) 10 MG tablet Take 1 tablet (10 mg total) by mouth every 8 (eight) hours as needed for anxiety. 270 tablet 0   meclizine (ANTIVERT) 25 MG tablet Take 25 mg by mouth daily as needed for dizziness.     pantoprazole (PROTONIX) 40 MG tablet Take 1 tablet (40 mg total) by mouth daily. 90 tablet 3   rosuvastatin (CRESTOR) 20 MG tablet Take 1 tablet (20 mg total) by mouth daily. 90 tablet 1   tizanidine (ZANAFLEX) 2 MG capsule Take 4 mg by mouth at bedtime.     traMADol (ULTRAM) 50 MG tablet Take 50 mg by mouth 2 (two) times daily.     venlafaxine XR (EFFEXOR-XR) 37.5 MG 24 hr capsule Take 2 capsules (75 mg total) by mouth at bedtime. 180 capsule 0   amLODipine (NORVASC) 5 MG tablet Take 1 tablet (5 mg total) by mouth daily. 90 tablet 1   carbamazepine (CARBATROL) 300 MG 12 hr capsule TAKE 1  CAPSULE(300 MG) BY MOUTH AT BEDTIME 90 capsule 0   telmisartan-hydrochlorothiazide (MICARDIS HCT) 40-12.5 MG tablet Take 1 tablet by mouth daily. 90 tablet 0   No facility-administered medications prior to visit.    ROS Review of Systems  Constitutional:  Positive for fatigue. Negative for appetite change, chills, diaphoresis and unexpected weight change.  HENT: Negative.    Eyes: Negative.   Respiratory:  Negative for cough, chest tightness and wheezing.   Cardiovascular:  Negative for chest pain, palpitations and leg swelling.  Gastrointestinal:  Negative for abdominal pain, diarrhea, nausea and vomiting.  Endocrine: Negative.   Genitourinary: Negative.  Negative for difficulty urinating.  Musculoskeletal:  Negative for arthralgias and myalgias.  Skin: Negative.  Negative for color change.  Neurological:  Negative for dizziness, weakness, light-headedness and headaches.  Hematological:  Negative for adenopathy. Does not bruise/bleed easily.  Psychiatric/Behavioral: Negative.      Objective:  BP 138/68 (BP Location: Left Arm, Patient Position: Sitting, Cuff Size: Large)   Pulse 75   Temp 98.3 F (36.8 C) (Oral)   Resp 16   Ht 5\' 2"  (1.575 m)   Wt 145 lb (65.8 kg)   SpO2 99%   BMI 26.52 kg/m  BP Readings from Last 3 Encounters:  01/23/23 (!) 140/56  01/21/23 138/68  01/02/23 105/65    Wt Readings from Last 3 Encounters:  01/23/23 146 lb (66.2 kg)  01/21/23 145 lb (65.8 kg)  01/02/23 147 lb (66.7 kg)    Physical Exam Vitals reviewed.  Constitutional:      Appearance: Normal appearance.  HENT:     Nose: Nose normal.     Mouth/Throat:     Mouth: Mucous membranes are moist.  Eyes:     General: No scleral icterus.    Conjunctiva/sclera: Conjunctivae normal.  Cardiovascular:     Rate and Rhythm: Normal rate and regular rhythm.     Heart sounds: No murmur heard. Pulmonary:     Effort: Pulmonary effort is normal.     Breath sounds: No stridor. No wheezing,  rhonchi or rales.  Abdominal:     General: Abdomen is flat.     Palpations: There is no mass.     Tenderness: There is no abdominal tenderness. There is no guarding.     Hernia: No hernia is present.  Musculoskeletal:        General: Normal range of motion.     Cervical back: Neck supple.     Right lower leg: No edema.     Left lower leg: No edema.  Lymphadenopathy:     Cervical: No cervical adenopathy.  Skin:    General: Skin is warm and dry.  Neurological:     General: No focal deficit present.     Mental Status: She is alert. Mental status is at baseline.  Psychiatric:        Mood and Affect: Mood normal.        Behavior: Behavior normal.     Lab Results  Component Value Date   WBC 5.6 11/01/2022   HGB 11.6 (L) 11/01/2022   HCT 35.5 (L) 11/01/2022   PLT 314 11/01/2022   GLUCOSE 117 (H) 12/25/2022   CHOL 164 12/25/2022   TRIG 84 12/25/2022   HDL 103 12/25/2022   LDLCALC 46 12/25/2022   ALT 19 12/25/2022   AST 25 12/25/2022   NA 142 12/25/2022   K 4.2 12/25/2022   CL 101 12/25/2022   CREATININE 0.99 12/25/2022   BUN 15 12/25/2022   CO2 25 12/25/2022   TSH 2.11 01/21/2023   INR 0.9 05/07/2022   HGBA1C 5.7 (H) 05/08/2022    ECHO TEE  Result Date: 07/05/2022    TRANSESOPHOGEAL ECHO REPORT   Patient Name:   Debbie Hatfield Date of Exam: 07/05/2022 Medical Rec #:  [id number]      Height:       62.0 in Accession #:    [id number]     Weight:       145.1 lb Date of Birth:  02/20/1956       BSA:          1.668 m Patient Age:    66 years       BP:           164/72 mmHg Patient Gender: F              HR:           90 bpm. Exam Location:  Inpatient Procedure: Transesophageal Echo, Color Doppler, Cardiac Doppler and Saline            Contrast Bubble Study Indications:     Stroke i63.9  History:  Patient has prior history of Echocardiogram examinations, most                  recent 05/07/2022. Risk Factors:Hypertension and Dyslipidemia.  Sonographer:     Irving BurtonEmily Senior RDCS  Referring Phys:  Norman HerrlichMUNLEY, BRIAN, J Diagnosing Phys: Weston BrassGayatri Acharya MD PROCEDURE: After discussion of the risks and benefits of a TEE, an informed consent was obtained from the patient. TEE procedure time was 16 minutes. The transesophogeal probe was passed without difficulty through the esophogus of the patient. Imaged were obtained with the patient in a left lateral decubitus position. Local oropharyngeal anesthetic was provided with Cetacaine. Sedation performed by different physician. The patient was monitored while under deep sedation. Anesthestetic sedation was provided intravenously by Anesthesiology: 335mg  of Propofol. Image quality was excellent. The patient's vital signs; including heart rate, blood pressure, and oxygen saturation; remained stable throughout the procedure. The patient developed no complications during the procedure. IMPRESSIONS  1. Left ventricular ejection fraction, by estimation, is 65 to 70%. The left ventricle has normal function. The left ventricle has no regional wall motion abnormalities.  2. Right ventricular systolic function is normal. The right ventricular size is normal.  3. Left atrial size was mildly dilated. No left atrial/left atrial appendage thrombus was detected. The LAA emptying velocity was 95 cm/s.  4. A small pericardial effusion is present.  5. The mitral valve is normal in structure. Trivial mitral valve regurgitation. No evidence of mitral stenosis.  6. The aortic valve is tricuspid. There is mild calcification of the aortic valve. Aortic valve regurgitation is trivial. Mild aortic valve stenosis. Aortic valve mean gradient measures 12.0 mmHg.  7. There is mild (Grade II) atheroma plaque involving the aortic arch and descending aorta.  8. Evidence of atrial level shunting detected by color flow Doppler. Agitated saline contrast bubble study was positive with shunting observed within 3-6 cardiac cycles suggestive of interatrial shunt. There is a small patent foramen  ovale with bidirectional shunting across atrial septum. FINDINGS  Left Ventricle: Left ventricular ejection fraction, by estimation, is 65 to 70%. The left ventricle has normal function. The left ventricle has no regional wall motion abnormalities. The left ventricular internal cavity size was normal in size. Right Ventricle: The right ventricular size is normal. No increase in right ventricular wall thickness. Right ventricular systolic function is normal. Left Atrium: Left atrial size was mildly dilated. No left atrial/left atrial appendage thrombus was detected. The LAA emptying velocity was 95 cm/s. Right Atrium: Right atrial size was normal in size. Pericardium: A small pericardial effusion is present. Mitral Valve: The mitral valve is normal in structure. Trivial mitral valve regurgitation. No evidence of mitral valve stenosis. Tricuspid Valve: The tricuspid valve is normal in structure. Tricuspid valve regurgitation is trivial. No evidence of tricuspid stenosis. Aortic Valve: The aortic valve is tricuspid. There is mild calcification of the aortic valve. Aortic valve regurgitation is trivial. Mild aortic stenosis is present. Aortic valve mean gradient measures 12.0 mmHg. Aortic valve peak gradient measures 17.2 mmHg. Aortic valve area, by VTI measures 1.83 cm. Pulmonic Valve: The pulmonic valve was normal in structure. Pulmonic valve regurgitation is trivial. No evidence of pulmonic stenosis. Aorta: The aortic root and ascending aorta are structurally normal, with no evidence of dilitation. There is mild (Grade II) atheroma plaque involving the aortic arch and descending aorta. IAS/Shunts: There is redundancy of the interatrial septum. Evidence of atrial level shunting detected by color flow Doppler. Agitated saline contrast was given intravenously to  evaluate for intracardiac shunting. Agitated saline contrast bubble study was  positive with shunting observed within 3-6 cardiac cycles suggestive of  interatrial shunt. A small patent foramen ovale is detected with bidirectional shunting across atrial septum.  LEFT VENTRICLE PLAX 2D LVOT diam:     2.00 cm LV SV:         78 LV SV Index:   47 LVOT Area:     3.14 cm  AORTIC VALVE AV Area (Vmax):    1.60 cm AV Area (Vmean):   1.70 cm AV Area (VTI):     1.83 cm AV Vmax:           207.50 cm/s AV Vmean:          156.000 cm/s AV VTI:            0.423 m AV Peak Grad:      17.2 mmHg AV Mean Grad:      12.0 mmHg LVOT Vmax:         106.00 cm/s LVOT Vmean:        84.600 cm/s LVOT VTI:          0.247 m LVOT/AV VTI ratio: 0.58  AORTA Ao Root diam: 3.00 cm Ao Asc diam:  3.00 cm  SHUNTS Systemic VTI:  0.25 m Systemic Diam: 2.00 cm Weston Brass MD Electronically signed by Weston Brass MD Signature Date/Time: 07/05/2022/1:28:03 PM    Final     Assessment & Plan:    Anemia, chronic disease - Her H/H have been stable.  Trigeminal neuralgia -     carBAMazepine ER; TAKE 1 CAPSULE(300 MG) BY MOUTH AT BEDTIME  Dispense: 90 capsule; Refill: 0  Primary hypertension - Her BP is adequately well controlled. -     TSH; Future -     Telmisartan-HCTZ; Take 1 tablet by mouth daily.  Dispense: 90 tablet; Refill: 0     Follow-up: Return in about 4 months (around 05/23/2023).  [name] Linger, MD

## 2023-01-21 NOTE — Patient Instructions (Signed)
Hypertension, Adult High blood pressure (hypertension) is when the force of blood pumping through the arteries is too strong. The arteries are the blood vessels that carry blood from the heart throughout the body. Hypertension forces the heart to work harder to pump blood and may cause arteries to become narrow or stiff. Untreated or uncontrolled hypertension can lead to a heart attack, heart failure, a stroke, kidney disease, and other problems. A blood pressure reading consists of a higher number over a lower number. Ideally, your blood pressure should be below 120/80. The first ("top") number is called the systolic pressure. It is a measure of the pressure in your arteries as your heart beats. The second ("bottom") number is called the diastolic pressure. It is a measure of the pressure in your arteries as the heart relaxes. What are the causes? The exact cause of this condition is not known. There are some conditions that result in high blood pressure. What increases the risk? Certain factors may make you more likely to develop high blood pressure. Some of these risk factors are under your control, including: Smoking. Not getting enough exercise or physical activity. Being overweight. Having too much fat, sugar, calories, or salt (sodium) in your diet. Drinking too much alcohol. Other risk factors include: Having a personal history of heart disease, diabetes, high cholesterol, or kidney disease. Stress. Having a family history of high blood pressure and high cholesterol. Having obstructive sleep apnea. Age. The risk increases with age. What are the signs or symptoms? High blood pressure may not cause symptoms. Very high blood pressure (hypertensive crisis) may cause: Headache. Fast or irregular heartbeats (palpitations). Shortness of breath. Nosebleed. Nausea and vomiting. Vision changes. Severe chest pain, dizziness, and seizures. How is this diagnosed? This condition is diagnosed by  measuring your blood pressure while you are seated, with your arm resting on a flat surface, your legs uncrossed, and your feet flat on the floor. The cuff of the blood pressure monitor will be placed directly against the skin of your upper arm at the level of your heart. Blood pressure should be measured at least twice using the same arm. Certain conditions can cause a difference in blood pressure between your right and left arms. If you have a high blood pressure reading during one visit or you have normal blood pressure with other risk factors, you may be asked to: Return on a different day to have your blood pressure checked again. Monitor your blood pressure at home for 1 week or longer. If you are diagnosed with hypertension, you may have other blood or imaging tests to help your health care provider understand your overall risk for other conditions. How is this treated? This condition is treated by making healthy lifestyle changes, such as eating healthy foods, exercising more, and reducing your alcohol intake. You may be referred for counseling on a healthy diet and physical activity. Your health care provider may prescribe medicine if lifestyle changes are not enough to get your blood pressure under control and if: Your systolic blood pressure is above 130. Your diastolic blood pressure is above 80. Your personal target blood pressure may vary depending on your medical conditions, your age, and other factors. Follow these instructions at home: Eating and drinking  Eat a diet that is high in fiber and potassium, and low in sodium, added sugar, and fat. An example of this eating plan is called the DASH diet. DASH stands for Dietary Approaches to Stop Hypertension. To eat this way: Eat   plenty of fresh fruits and vegetables. Try to fill one half of your plate at each meal with fruits and vegetables. Eat whole grains, such as whole-wheat pasta, brown rice, or whole-grain bread. Fill about one  fourth of your plate with whole grains. Eat or drink low-fat dairy products, such as skim milk or low-fat yogurt. Avoid fatty cuts of meat, processed or cured meats, and poultry with skin. Fill about one fourth of your plate with lean proteins, such as fish, chicken without skin, beans, eggs, or tofu. Avoid pre-made and processed foods. These tend to be higher in sodium, added sugar, and fat. Reduce your daily sodium intake. Many people with hypertension should eat less than 1,500 mg of sodium a day. Do not drink alcohol if: Your health care provider tells you not to drink. You are pregnant, may be pregnant, or are planning to become pregnant. If you drink alcohol: Limit how much you have to: 0-1 drink a day for women. 0-2 drinks a day for men. Know how much alcohol is in your drink. In the [place] one drink equals one 12 oz bottle of beer (355 mL), one 5 oz glass of wine (148 mL), or one 1 oz glass of hard liquor (44 mL). Lifestyle  Work with your health care provider to maintain a healthy body weight or to lose weight. Ask what an ideal weight is for you. Get at least 30 minutes of exercise that causes your heart to beat faster (aerobic exercise) most days of the week. Activities may include walking, swimming, or biking. Include exercise to strengthen your muscles (resistance exercise), such as Pilates or lifting weights, as part of your weekly exercise routine. Try to do these types of exercises for 30 minutes at least 3 days a week. Do not use any products that contain nicotine or tobacco. These products include cigarettes, chewing tobacco, and vaping devices, such as e-cigarettes. If you need help quitting, ask your health care provider. Monitor your blood pressure at home as told by your health care provider. Keep all follow-up visits. This is important. Medicines Take over-the-counter and prescription medicines only as told by your health care provider. Follow directions carefully. Blood  pressure medicines must be taken as prescribed. Do not skip doses of blood pressure medicine. Doing this puts you at risk for problems and can make the medicine less effective. Ask your health care provider about side effects or reactions to medicines that you should watch for. Contact a health care provider if you: Think you are having a reaction to a medicine you are taking. Have headaches that keep coming back (recurring). Feel dizzy. Have swelling in your ankles. Have trouble with your vision. Get help right away if you: Develop a severe headache or confusion. Have unusual weakness or numbness. Feel faint. Have severe pain in your chest or abdomen. Vomit repeatedly. Have trouble breathing. These symptoms may be an emergency. Get help right away. Call 911. Do not wait to see if the symptoms will go away. Do not drive yourself to the hospital. Summary Hypertension is when the force of blood pumping through your arteries is too strong. If this condition is not controlled, it may put you at risk for serious complications. Your personal target blood pressure may vary depending on your medical conditions, your age, and other factors. For most people, a normal blood pressure is less than 120/80. Hypertension is treated with lifestyle changes, medicines, or a combination of both. Lifestyle changes include losing weight, eating a healthy,   low-sodium diet, exercising more, and limiting alcohol. This information is not intended to replace advice given to you by your health care provider. Make sure you discuss any questions you have with your health care provider. Document Revised: 08/08/2021 Document Reviewed: 08/08/2021 Elsevier Patient Education  2023 [employer]  

## 2023-01-23 ENCOUNTER — Encounter: Payer: Self-pay | Admitting: Neurology

## 2023-01-23 ENCOUNTER — Ambulatory Visit (INDEPENDENT_AMBULATORY_CARE_PROVIDER_SITE_OTHER): Payer: Medicare Other | Admitting: Neurology

## 2023-01-23 VITALS — BP 140/56 | HR 59 | Ht 62.0 in | Wt 146.0 lb

## 2023-01-23 DIAGNOSIS — R4701 Aphasia: Secondary | ICD-10-CM | POA: Diagnosis not present

## 2023-01-23 DIAGNOSIS — G500 Trigeminal neuralgia: Secondary | ICD-10-CM

## 2023-01-23 DIAGNOSIS — R9082 White matter disease, unspecified: Secondary | ICD-10-CM

## 2023-01-23 DIAGNOSIS — I63311 Cerebral infarction due to thrombosis of right middle cerebral artery: Secondary | ICD-10-CM

## 2023-01-23 DIAGNOSIS — R260 Ataxic gait: Secondary | ICD-10-CM

## 2023-01-23 DIAGNOSIS — D6861 Antiphospholipid syndrome: Secondary | ICD-10-CM

## 2023-01-23 MED ORDER — CLONAZEPAM 0.5 MG PO TABS: 0.5000 mg | ORAL_TABLET | Freq: Every day | ORAL | 5 refills | Status: DC

## 2023-01-23 NOTE — Progress Notes (Addendum)
[facility]  PATIENT: Debbie Hatfield DOB: 07/13/1956  REFERRING DOCTOR OR PCP: [name] Linger, MD (PCP); Elmer Picker, NP (referring provider) SOURCE: Patient, notes from hospital admission, imaging and lab reports, MRI and CT images personally reviewed.  _________________________________   HISTORICAL  CHIEF COMPLAINT:  Chief Complaint  Patient presents with   Room 10    Pt is here Alone. Pt states that she is still having pain in her right leg. Pt states that her Baclofen causes her to have vertigo. Pt states she has numbness from hip to knee on her left side.  Pt states that she has fallen 3 times this year.    She is a 67 year old woman with history of stroke and antiphospholipid syndrome  HISTORY OF PRESENT  Since the last visit, she denies any major new problems.   She still has impairments including word finding difficult, focus/processing (auditory > visual).   She has rouble standing without using er arms (balance as well as rheumatoid arthritis/SLE)  She has SLE/RA and sees Dr. Corliss Skains.   She has antiphospholipid antibodies and SLE (dx 30 years ago).  y   She was placed on Imuran and had difficulty tolerating it.   She also had difficulty tolerating methotrexate.    Plaquenil caused eye issues.   Ranae Plumber was discussed at yesterday's visit with rheum  She is on aspirin and plavix DAPT for prophylaxis.   She also has a PFO and saw Dr. Excell Seltzer.  Closure has not been recommended.    She has phasic spasms of right leg/hip that started after the stroke.  These occur 3-4 times a day, worse at night..  Tizanifine had not helped.   Addition of baclofen 5 -5 -10 mg helped but can only tolerate at night.   .Spasms can be painful.       She takes tramadol (just 0-1 a day) and gabapentin 600 tid and 4 mg tizanidine and baclofen at bedtime.     Also on CBZ for trigeminal neuralgia.     She snores and has excessive daytime sleepiness.  She often dozes off watching TV and falls  asleep easily during the day.  A home sleep study 07/17/2022 did not show any significant OSA (AHI 4.6).  History of stroke: She was working as an Charity fundraiser.  She was driving to work 1/61/0960 3 AM when she noted her left face was drooping and she felt speech unable to speak. At first she had difficulty with word finding and slurring.  She stuttered a little.     She went to [employer] where she worked and another Engineer, civil (consulting) also noted the facial droop and speech issues.  Her tongue felt thick and she felt numb on the left side.Marland Kitchen  She went to the ED at [facility].  She had a CT, MRi and tenecteplase (TNK) thrombolytic therapy quickly.   She saw Dr. Roda Shutters.   After TNK therapy, facial weakness and speech started to improve 8 hours later.   She is seeing speech therapy and is near baseline now..     She noted some patchy visual changes around the time of the stroke x 2 week.       She notes mild cognitive issues since the stroke but not before.     Other neurologic events:  She had bells palsy many years ago and has left Trigeminal neuralgia x 30 years.  She has noted left leg weakness x 10 years that is progressing.  She  can walk one hour but is slower than others.     The onset of the leg weakness was fairly sudden around 10 years ago.   Gait has slowly worsened.  She has 3 falls this year.  She has trouble getting up from the floor.     She has left anterolateral thigh numbness.   She has visual changes due to Plaquenil toxicity (for SLE vs undifferentiated CTD).   Imaging: MRI of the brain 05/07/2022 showed an acute stroke in the right frontoparietal region.  She has multiple T2/FLAIR hypertense foci.  Many are nonspecific but some are periventricular and radially oriented to the ventricles.  CT angiogram showed no large vessel occlusion or significant stenosis.  CT angiogram of the head and neck 05/07/2022 showed no emergent large vessel occlusion.  A thyroid nodule was noted.  MRI of the cervical and thoracic spine  06/24/2022 shows a normal spinal cord.  In the cervical spine there are mild degenerative changes at C5-C6 and C6-C7.  No spinal stenosis or nerve root compression.  MRI of the lumbar spine from 06/04/2022:   At L2-L3, there is minimal retrolisthesis and disc bulging.  There is moderate right facet hypertrophy.  No spinal stenosis.  Mild left foraminal narrowing but no nerve root compression.    At L3-L4, there is minimal anterolisthesis associated with severe facet hypertrophy and other degenerative change.  There is moderately severe left and mild right foraminal narrowing with potential for left L3 nerve root compression.    L4-L5: There is minimal anterolisthesis and right greater than left facet hypertrophy and other degenerative change causing moderately severe right foraminal narrowing with potential for right L4 nerve root compression.  No spinal stenosis.  REVIEW OF SYSTEMS: Constitutional: No fevers, chills, sweats, or change in appetite Eyes: No visual changes, double vision, eye pain Ear, nose and throat: No hearing loss, ear pain, nasal congestion, sore throat Cardiovascular: No chest pain, palpitations Respiratory:  No shortness of breath at rest or with exertion.   No wheezes GastrointestinaI: No nausea, vomiting, diarrhea, abdominal pain, fecal incontinence Genitourinary:  No dysuria, urinary retention or frequency.  No nocturia. Musculoskeletal:  No neck pain, back pain Integumentary: No rash, pruritus, skin lesions Neurological: as above Psychiatric: No depression at this time.  No anxiety Endocrine: No palpitations, diaphoresis, change in appetite, change in weigh or increased thirst Hematologic/Lymphatic:  No anemia, purpura, petechiae. Allergic/Immunologic: No itchy/runny eyes, nasal congestion, recent allergic reactions, rashes  ALLERGIES: Allergies  Allergen Reactions   Plaquenil [Hydroxychloroquine]     Retinal damage   Sulfa Antibiotics Nausea And Vomiting    Penicillins Rash    HOME MEDICATIONS:  Current Outpatient Medications:    acetaminophen (TYLENOL) 325 MG tablet, Take 650 mg by mouth as needed., Disp: , Rfl:    acyclovir (ZOVIRAX) 400 MG tablet, Take 1 tablet (400 mg total) by mouth daily., Disp: 90 tablet, Rfl: 1   aspirin EC 81 MG tablet, Take 1 tablet (81 mg total) by mouth daily. Swallow whole. (Patient taking differently: Take 81 mg by mouth at bedtime. Swallow whole.), Disp: 30 tablet, Rfl: 12   baclofen (LIORESAL) 10 MG tablet, Take 1 tablet (10 mg total) by mouth 3 (three) times daily. (Patient taking differently: Take 10 mg by mouth as needed.), Disp: 90 each, Rfl: 5   carbamazepine (CARBATROL) 300 MG 12 hr capsule, TAKE 1 CAPSULE(300 MG) BY MOUTH AT BEDTIME, Disp: 90 capsule, Rfl: 0   clonazePAM (KLONOPIN) 0.5 MG tablet, Take 1 tablet (  0.5 mg total) by mouth at bedtime., Disp: 30 tablet, Rfl: 5   clopidogrel (PLAVIX) 75 MG tablet, Take 1 tablet (75 mg total) by mouth daily., Disp: 30 tablet, Rfl: 11   diclofenac Sodium (VOLTAREN) 1 % GEL, APPLY 2 GRAMS TO THE AFFECTED AREA(S) BY TOPICAL ROUTE 2-3 TIMES PER DAY, Disp: , Rfl:    gabapentin (NEURONTIN) 300 MG capsule, Take 600 mg by mouth 2 (two) times daily., Disp: , Rfl:    hydrochlorothiazide (HYDRODIURIL) 12.5 MG tablet, Take 12.5 mg by mouth daily., Disp: , Rfl:    hydrOXYzine (ATARAX) 10 MG tablet, Take 1 tablet (10 mg total) by mouth every 8 (eight) hours as needed for anxiety., Disp: 270 tablet, Rfl: 0   meclizine (ANTIVERT) 25 MG tablet, Take 25 mg by mouth daily as needed for dizziness., Disp: , Rfl:    pantoprazole (PROTONIX) 40 MG tablet, Take 1 tablet (40 mg total) by mouth daily., Disp: 90 tablet, Rfl: 3   rosuvastatin (CRESTOR) 20 MG tablet, Take 1 tablet (20 mg total) by mouth daily., Disp: 90 tablet, Rfl: 1   telmisartan-hydrochlorothiazide (MICARDIS HCT) 40-12.5 MG tablet, Take 1 tablet by mouth daily., Disp: 90 tablet, Rfl: 0   tizanidine (ZANAFLEX) 2 MG capsule, Take  4 mg by mouth at bedtime., Disp: , Rfl:    traMADol (ULTRAM) 50 MG tablet, Take 50 mg by mouth 2 (two) times daily., Disp: , Rfl:    venlafaxine XR (EFFEXOR-XR) 37.5 MG 24 hr capsule, Take 2 capsules (75 mg total) by mouth at bedtime., Disp: 180 capsule, Rfl: 0  PAST MEDICAL HISTORY: Past Medical History:  Diagnosis Date   Closed fracture of right distal radius    Gastroparesis    GERD (gastroesophageal reflux disease)    Hypertension    Irritable bowel disease    Lupus    Major depression    Stroke    Trigeminal neuralgia     PAST SURGICAL HISTORY: Past Surgical History:  Procedure Laterality Date   BUBBLE STUDY  07/05/2022   Procedure: BUBBLE STUDY;  Surgeon: Parke PoissonAcharya, Gayatri A, MD;  [facility];  Service: Cardiology;;   CHOLECYSTECTOMY     COLONOSCOPY     LOOP RECORDER IMPLANT  2021   ORIF WRIST FRACTURE Right 09/22/2020   Procedure: OPEN REDUCTION INTERNAL FIXATION (ORIF) WRIST FRACTURE;  Surgeon: Ernest Mallickreighton, James J III, MD;  Location: [facility];  Service: Orthopedics;  Laterality: Right;  90min   TEE WITHOUT CARDIOVERSION N/A 07/05/2022   Procedure: TRANSESOPHAGEAL ECHOCARDIOGRAM (TEE);  Surgeon: Parke PoissonAcharya, Gayatri A, MD;  [facility];  Service: Cardiology;  Laterality: N/A;    FAMILY HISTORY: Family History  Problem Relation Age of Onset   Heart block Mother        Pacemaker   Hypertension Mother    Depression Mother    Gout Mother    Macular degeneration Mother    Aneurysm Mother    Alcoholism Father    Congestive Heart Failure Maternal Grandmother    Diabetes Maternal Grandmother    Congestive Heart Failure Maternal Grandfather    Colon cancer Maternal Grandfather    Stroke Paternal Grandfather    Hypertension Paternal Grandfather     SOCIAL HISTORY:  Social History   Socioeconomic History   Marital status: Widowed    Spouse name: Not on file   Number of children: 0   Years of education: Not on file   Highest  education level: Bachelor's degree (e.g., BA, AB, BS)  Occupational  History    Comment: RN ortho/neuro at [employer]  Tobacco Use   Smoking status: Never    Passive exposure: Never   Smokeless tobacco: Never  Vaping Use   Vaping Use: Never used  Substance and Sexual Activity   Alcohol use: Yes    Comment: social   Drug use: Never   Sexual activity: Not Currently    Birth control/protection: Post-menopausal  Other Topics Concern   Not on file  Social History Narrative   06/13/22 living with her mother   Social Determinants of Health   Financial Resource Strain: Low Risk  (10/24/2022)   Overall Financial Resource Strain (CARDIA)    Difficulty of Paying Living Expenses: Not hard at all  Food Insecurity: No Food Insecurity (10/24/2022)   Hunger Vital Sign    Worried About Running Out of Food in the Last Year: Never true    Ran Out of Food in the Last Year: Never true  Transportation Needs: No Transportation Needs (10/24/2022)   PRAPARE - Administrator, Civil Service (Medical): No    Lack of Transportation (Non-Medical): No  Physical Activity: Inactive (10/24/2022)   Exercise Vital Sign    Days of Exercise per Week: 0 days    Minutes of Exercise per Session: 0 min  Stress: No Stress Concern Present (10/24/2022)   [employer] of Occupational Health - Occupational Stress Questionnaire    Feeling of Stress : Only a little  Social Connections: Moderately Integrated (10/24/2022)   Social Connection and Isolation Panel [NHANES]    Frequency of Communication with Friends and Family: More than three times a week    Frequency of Social Gatherings with Friends and Family: Three times a week    Attends Religious Services: More than 4 times per year    Active Member of Clubs or Organizations: Yes    Attends Banker Meetings: More than 4 times per year    Marital Status: Widowed  Intimate Partner Violence: Not At Risk (10/24/2022)   Humiliation, Afraid, Rape, and Kick  questionnaire    Fear of Current or Ex-Partner: No    Emotionally Abused: No    Physically Abused: No    Sexually Abused: No     PHYSICAL EXAM  Vitals:   01/23/23 1132  BP: (!) 140/56  Pulse: (!) 59  Weight: 146 lb (66.2 kg)  Height: 5\' 2"  (1.575 m)     Body mass index is 26.7 kg/m.   General: The patient is well-developed and well-nourished and in no acute distress  HEENT:  Head is [place]/AT.  Sclera are anicteric.    Skin: Extremities are without rash or  edema.  Musculoskeletal:  Back is nontender  Neurologic Exam  Mental status: The patient is alert and oriented x 3 at the time of the examination. No obvious memory issue during visit.   Speech shows mild word finding difficulty.  She was able to name objects pointed to around the room.  Cranial nerves: Extraocular movements are full.    She has symmetric facial sensation .Facial strength is normal.  Trapezius and sternocleidomastoid strength is normal. No dysarthria is noted.  The tongue is midline, and the patient has symmetric elevation of the soft palate. No obvious hearing deficits are noted.  Motor:  Muscle bulk is normal.   Tone is normal. Strength is  5 / 5 in the arms and right leg.  Strength was 4+/5 in the left EHL..   Sensory: Sensory testing is intact to  pinprick, soft touch and vibration sensation in the arms but she had reduced sensation to touch in the distribution of the left lateral femoral cutaneous nerve..  Coordination: Cerebellar testing reveals good finger-nose-finger and heel-to-shin bilaterally.  Gait and station: Station is normal.   Gait is near normal though the tandem gait is wide.. Romberg is negative.   Reflexes: Deep tendon reflexes are symmetric and normal in the arms  3 inlegs with spread at knees but no clonus at ankles.    DIAGNOSTIC DATA (LABS, IMAGING, TESTING) - I reviewed patient records, labs, notes, testing and imaging myself where available.  Lab Results  Component Value  Date   WBC 5.6 11/01/2022   HGB 11.6 (L) 11/01/2022   HCT 35.5 (L) 11/01/2022   MCV 91.3 11/01/2022   PLT 314 11/01/2022      Component Value Date/Time   NA 142 12/25/2022 1047   K 4.2 12/25/2022 1047   CL 101 12/25/2022 1047   CO2 25 12/25/2022 1047   GLUCOSE 117 (H) 12/25/2022 1047   GLUCOSE 75 11/01/2022 1333   BUN 15 12/25/2022 1047   CREATININE 0.99 12/25/2022 1047   CREATININE 1.00 11/01/2022 1333   CREATININE 1.01 10/19/2022 0922   CALCIUM 10.1 12/25/2022 1047   PROT 7.3 12/25/2022 1047   ALBUMIN 4.8 12/25/2022 1047   AST 25 12/25/2022 1047   AST 24 11/01/2022 1333   ALT 19 12/25/2022 1047   ALT 18 11/01/2022 1333   ALKPHOS 116 12/25/2022 1047   BILITOT 0.4 12/25/2022 1047   BILITOT 0.4 11/01/2022 1333   GFRNONAA >60 11/01/2022 1333   GFRAA >90 07/07/2012 0610   Lab Results  Component Value Date   CHOL 164 12/25/2022   HDL 103 12/25/2022   LDLCALC 46 12/25/2022   TRIG 84 12/25/2022   CHOLHDL 1.6 12/25/2022   Lab Results  Component Value Date   HGBA1C 5.7 (H) 05/08/2022   No results found for: "VITAMINB12" Lab Results  Component Value Date   TSH 2.11 01/21/2023       ASSESSMENT AND PLAN  Cerebrovascular accident (CVA) due to thrombosis of right middle cerebral artery  Antiphospholipid antibody syndrome  White matter abnormality on MRI of brain  Trigeminal neuralgia  Ataxic gait  Aphasia  She appears stable.  She has history of stroke and also has nonspecific white matter foci in the brain.  None in the spinal cord.  For the stroke and antiphospholipid syndrome, continue Plavix and aspirin for now.   Change to just aspirin if any bleeding. She has had difficulty tolerating well rheumatologic medications.  OK for Arava if decided on by Rheumatology. She continues to have spasticity that is worse at night.  I will have her take clonazepam 0.5 mg nightly and she could combine this with either the baclofen or tizanidine.   She will return to see  me in 6 months or sooner based on the results of the studies.  She should also call if new or worsening symptoms.   Josealfredo Adkins A. Epimenio Foot, MD, [facility] 01/23/2023, 12:34 PM Certified in Neurology, Clinical Neurophysiology, Sleep Medicine and Neuroimaging  [facility] [address] [place], [place] [address] [phone]

## 2023-01-28 ENCOUNTER — Ambulatory Visit (INDEPENDENT_AMBULATORY_CARE_PROVIDER_SITE_OTHER): Payer: Medicare Other

## 2023-01-28 DIAGNOSIS — R55 Syncope and collapse: Secondary | ICD-10-CM

## 2023-01-29 LAB — CUP PACEART REMOTE DEVICE CHECK
Date Time Interrogation Session: 20240412230505
Implantable Pulse Generator Implant Date: 20220728

## 2023-02-04 NOTE — Progress Notes (Signed)
Carelink Summary Report / Loop Recorder 

## 2023-02-28 LAB — CUP PACEART REMOTE DEVICE CHECK
Date Time Interrogation Session: 20240515230646
Implantable Pulse Generator Implant Date: 20220728

## 2023-03-04 ENCOUNTER — Ambulatory Visit (INDEPENDENT_AMBULATORY_CARE_PROVIDER_SITE_OTHER): Payer: Medicare Other

## 2023-03-04 DIAGNOSIS — R55 Syncope and collapse: Secondary | ICD-10-CM | POA: Diagnosis not present

## 2023-03-06 NOTE — Progress Notes (Signed)
Carelink Summary Report / Loop Recorder 

## 2023-03-14 ENCOUNTER — Encounter: Payer: Self-pay | Admitting: Internal Medicine

## 2023-03-18 ENCOUNTER — Other Ambulatory Visit: Payer: Self-pay | Admitting: Internal Medicine

## 2023-03-18 ENCOUNTER — Encounter: Payer: Self-pay | Admitting: Rheumatology

## 2023-03-18 DIAGNOSIS — K635 Polyp of colon: Secondary | ICD-10-CM

## 2023-03-18 NOTE — Telephone Encounter (Signed)
Please schedule office visit for further evaluation.

## 2023-03-19 ENCOUNTER — Ambulatory Visit (INDEPENDENT_AMBULATORY_CARE_PROVIDER_SITE_OTHER): Payer: Medicare Other

## 2023-03-19 ENCOUNTER — Ambulatory Visit: Payer: Medicare Other

## 2023-03-19 ENCOUNTER — Encounter: Payer: Self-pay | Admitting: Physician Assistant

## 2023-03-19 ENCOUNTER — Ambulatory Visit: Payer: Medicare Other | Attending: Physician Assistant | Admitting: Physician Assistant

## 2023-03-19 VITALS — BP 162/74 | HR 60 | Resp 13 | Ht 62.0 in | Wt 145.4 lb

## 2023-03-19 DIAGNOSIS — R760 Raised antibody titer: Secondary | ICD-10-CM | POA: Diagnosis not present

## 2023-03-19 DIAGNOSIS — F3341 Major depressive disorder, recurrent, in partial remission: Secondary | ICD-10-CM | POA: Diagnosis present

## 2023-03-19 DIAGNOSIS — G500 Trigeminal neuralgia: Secondary | ICD-10-CM | POA: Diagnosis present

## 2023-03-19 DIAGNOSIS — M5136 Other intervertebral disc degeneration, lumbar region: Secondary | ICD-10-CM | POA: Diagnosis present

## 2023-03-19 DIAGNOSIS — M79672 Pain in left foot: Secondary | ICD-10-CM | POA: Diagnosis present

## 2023-03-19 DIAGNOSIS — M329 Systemic lupus erythematosus, unspecified: Secondary | ICD-10-CM | POA: Diagnosis present

## 2023-03-19 DIAGNOSIS — M791 Myalgia, unspecified site: Secondary | ICD-10-CM | POA: Diagnosis present

## 2023-03-19 DIAGNOSIS — M25561 Pain in right knee: Secondary | ICD-10-CM | POA: Insufficient documentation

## 2023-03-19 DIAGNOSIS — M51369 Other intervertebral disc degeneration, lumbar region without mention of lumbar back pain or lower extremity pain: Secondary | ICD-10-CM

## 2023-03-19 DIAGNOSIS — K279 Peptic ulcer, site unspecified, unspecified as acute or chronic, without hemorrhage or perforation: Secondary | ICD-10-CM

## 2023-03-19 DIAGNOSIS — Z79899 Other long term (current) drug therapy: Secondary | ICD-10-CM | POA: Diagnosis not present

## 2023-03-19 DIAGNOSIS — F411 Generalized anxiety disorder: Secondary | ICD-10-CM

## 2023-03-19 DIAGNOSIS — I340 Nonrheumatic mitral (valve) insufficiency: Secondary | ICD-10-CM

## 2023-03-19 DIAGNOSIS — M25562 Pain in left knee: Secondary | ICD-10-CM | POA: Insufficient documentation

## 2023-03-19 DIAGNOSIS — W57XXXD Bitten or stung by nonvenomous insect and other nonvenomous arthropods, subsequent encounter: Secondary | ICD-10-CM | POA: Diagnosis not present

## 2023-03-19 DIAGNOSIS — H35389 Toxic maculopathy, unspecified eye: Secondary | ICD-10-CM

## 2023-03-19 DIAGNOSIS — N1831 Chronic kidney disease, stage 3a: Secondary | ICD-10-CM | POA: Diagnosis present

## 2023-03-19 DIAGNOSIS — M79642 Pain in left hand: Secondary | ICD-10-CM

## 2023-03-19 DIAGNOSIS — M79641 Pain in right hand: Secondary | ICD-10-CM | POA: Diagnosis present

## 2023-03-19 DIAGNOSIS — Z8739 Personal history of other diseases of the musculoskeletal system and connective tissue: Secondary | ICD-10-CM | POA: Diagnosis present

## 2023-03-19 DIAGNOSIS — Z8719 Personal history of other diseases of the digestive system: Secondary | ICD-10-CM | POA: Diagnosis present

## 2023-03-19 DIAGNOSIS — M7061 Trochanteric bursitis, right hip: Secondary | ICD-10-CM | POA: Diagnosis present

## 2023-03-19 DIAGNOSIS — M79671 Pain in right foot: Secondary | ICD-10-CM

## 2023-03-19 DIAGNOSIS — I10 Essential (primary) hypertension: Secondary | ICD-10-CM | POA: Diagnosis present

## 2023-03-19 DIAGNOSIS — E785 Hyperlipidemia, unspecified: Secondary | ICD-10-CM | POA: Diagnosis present

## 2023-03-19 DIAGNOSIS — T372X5A Adverse effect of antimalarials and drugs acting on other blood protozoa, initial encounter: Secondary | ICD-10-CM | POA: Diagnosis present

## 2023-03-19 DIAGNOSIS — I63311 Cerebral infarction due to thrombosis of right middle cerebral artery: Secondary | ICD-10-CM | POA: Diagnosis present

## 2023-03-19 DIAGNOSIS — D638 Anemia in other chronic diseases classified elsewhere: Secondary | ICD-10-CM

## 2023-03-19 DIAGNOSIS — G8929 Other chronic pain: Secondary | ICD-10-CM

## 2023-03-19 DIAGNOSIS — K3184 Gastroparesis: Secondary | ICD-10-CM | POA: Diagnosis present

## 2023-03-19 DIAGNOSIS — IMO0002 Reserved for concepts with insufficient information to code with codable children: Secondary | ICD-10-CM

## 2023-03-19 LAB — CBC WITH DIFFERENTIAL/PLATELET
Absolute Monocytes: 562 cells/uL (ref 200–950)
Eosinophils Absolute: 70 cells/uL (ref 15–500)
MCHC: 32.1 g/dL (ref 32.0–36.0)
Monocytes Relative: 10.4 %
Platelets: 290 10*3/uL (ref 140–400)

## 2023-03-19 LAB — SEDIMENTATION RATE: Sed Rate: 11 mm/h (ref 0–30)

## 2023-03-19 NOTE — Progress Notes (Signed)
Office Visit Note  Patient: Debbie Hatfield             Date of Birth: 1956-04-11           MRN: [id number]             PCP: Etta Grandchild, MD Referring: Etta Grandchild, MD Visit Date: 03/19/2023 Occupation: @GUAROCC @  Subjective:  Pain in both hands and feet   History of Present Illness: Debbie Hatfield is a 67 y.o. female with history of lupus.  She is not currently taking any immunosuppressive agents. Patient presents today with increased pain in both hands and both feet for the past 2 months.  She denies any change in activity prior to the onset of symptoms.  She denies any repetitive or overuse activities.  She is been having interrupted sleep at night due to nocturnal pain.  She has tried heat, Voltaren gel, arthritis compression gloves, Ultram, gabapentin, and Tylenol with minimal to no pain relief.  Her pain level has been a 6 out of 10 at times.  Pain is most severe in her right hand and right foot. She denies any other joint pain or joint swelling at this time.  She denies any other signs or symptoms of a lupus flare.  She has not had any recent rashes, sores in her mouth or nose, hair loss, or sicca symptoms. She has intermittent symptoms of raynaud's.     Activities of Daily Living:  Patient reports morning stiffness for all day. Patient Reports nocturnal pain.  Difficulty dressing/grooming: Denies Difficulty climbing stairs: Reports Difficulty getting out of chair: Reports Difficulty using hands for taps, buttons, cutlery, and/or writing: Reports  Review of Systems  Constitutional:  Positive for fatigue.  HENT:  Negative for mouth sores and mouth dryness.   Eyes:  Negative for dryness.  Respiratory:  Negative for shortness of breath.   Cardiovascular:  Negative for chest pain and palpitations.  Gastrointestinal:  Negative for blood in stool, constipation and diarrhea.  Endocrine: Negative for increased urination.  Genitourinary:  Negative for involuntary urination.   Musculoskeletal:  Positive for joint pain, gait problem, joint pain, joint swelling, myalgias, muscle weakness, morning stiffness, muscle tenderness and myalgias.  Skin:  Negative for color change, rash, hair loss and sensitivity to sunlight.  Allergic/Immunologic: Negative for susceptible to infections.  Neurological:  Negative for dizziness and headaches.  Hematological:  Negative for swollen glands.  Psychiatric/Behavioral:  Positive for depressed mood and sleep disturbance. The patient is nervous/anxious.     PMFS History:  Patient Active Problem List   Diagnosis Date Noted   Nonrheumatic mitral valve regurgitation 05/10/2022   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   Anemia, chronic disease 05/06/2022   Hyperlipidemia LDL goal <100 03/26/2022   Recurrent major depressive disorder, in partial remission (HCC) 11/15/2021   Need for shingles vaccine 11/15/2021   Visit for screening mammogram 11/15/2021   Need for vaccination 11/15/2021   GAD (generalized anxiety disorder) 11/15/2021   Stage 3a chronic kidney disease (HCC) 11/15/2021   Trigeminal neuralgia    Lupus (HCC)    Irritable bowel disease    GERD (gastroesophageal reflux disease)    Hypertension    Gastroparesis    Degeneration of lumbar intervertebral disc 01/26/2020   Osteopenia 12/14/2015   Peptic ulcer disease 12/14/2015    Past Medical History:  Diagnosis Date   Closed fracture of right distal radius    Gastroparesis    GERD (gastroesophageal reflux disease)  Hypertension    Irritable bowel disease    Lupus (HCC)    Major depression    Stroke [facility])    Trigeminal neuralgia     Family History  Problem Relation Age of Onset   Heart block Mother        Pacemaker   Hypertension Mother    Depression Mother    Gout Mother    Macular degeneration Mother    Aneurysm Mother    Alcoholism Father    Congestive Heart Failure Maternal Grandmother    Diabetes Maternal Grandmother    Congestive Heart Failure  Maternal Grandfather    Colon cancer Maternal Grandfather    Stroke Paternal Grandfather    Hypertension Paternal Grandfather    Past Surgical History:  Procedure Laterality Date   BUBBLE STUDY  07/05/2022   Procedure: BUBBLE STUDY;  Surgeon: Parke Poisson, MD;  Location: [facility] ENDOSCOPY;  Service: Cardiology;;   CHOLECYSTECTOMY     COLONOSCOPY     LOOP RECORDER IMPLANT  2021   ORIF WRIST FRACTURE Right 09/22/2020   Procedure: OPEN REDUCTION INTERNAL FIXATION (ORIF) WRIST FRACTURE;  Surgeon: Ernest Mallick, MD;  Location: [facility];  Service: Orthopedics;  Laterality: Right;    TEE WITHOUT CARDIOVERSION N/A 07/05/2022   Procedure: TRANSESOPHAGEAL ECHOCARDIOGRAM (TEE);  Surgeon: Parke Poisson, MD;  Location: [facility] ENDOSCOPY;  Service: Cardiology;  Laterality: N/A;   Social History   Social History Narrative   06/13/22 living with her mother   Immunization History  Administered Date(s) Administered   Influenza,inj,Quad PF,6-35 Mos 06/17/2019   Influenza-Unspecified 09/02/2020, 07/27/2021, 07/15/2022   Moderna SARS-COV2 Booster Vaccination 09/02/2020   Moderna Sars-Covid-2 Vaccination 10/23/2019, 11/20/2019   PNEUMOCOCCAL CONJUGATE-20 11/15/2021   Tdap 03/28/2014, 12/29/2020     Objective: Vital Signs: BP (!) 162/74 (BP Location: Left Arm, Patient Position: Sitting, Cuff Size: Normal)   Pulse 60   Resp 13   Ht 5\' 2"  (1.575 m)   Wt 145 lb 6.4 oz (66 kg)   BMI 26.59 kg/m    Physical Exam Vitals and nursing note reviewed.  Constitutional:      Appearance: She is well-developed.  HENT:     Head: Normocephalic and atraumatic.  Eyes:     Conjunctiva/sclera: Conjunctivae normal.  Cardiovascular:     Rate and Rhythm: Normal rate and regular rhythm.     Heart sounds: Normal heart sounds.  Pulmonary:     Effort: Pulmonary effort is normal.     Breath sounds: Normal breath sounds.  Abdominal:     General: Bowel sounds are normal.      Palpations: Abdomen is soft.  Musculoskeletal:     Cervical back: Normal range of motion.  Lymphadenopathy:     Cervical: No cervical adenopathy.  Skin:    General: Skin is warm and dry.     Capillary Refill: Capillary refill takes less than 2 seconds.  Neurological:     Mental Status: She is alert and oriented to person, place, and time.  Psychiatric:        Behavior: Behavior normal.      Musculoskeletal Exam: C-spine, thoracic spine, lumbar spine have good range of motion.  Shoulder joints, elbow joints, wrist joints have good range of motion.  Incomplete right fist formation noted.  Tenderness of all PIP DIP joints on the right hand.  Some tenderness over the left second and third PIP joints.  Some PIP and DIP thickening consistent with osteoarthritis of both hands.  Hip joints have good range of motion with no groin pain.  Knee joints have good range of motion with no warmth or effusion.  Ankle joints have good range of motion with no tenderness or joint swelling.  Tenderness over the right second and third MTP joints.  Some PIP and DIP thickening consistent with osteoarthritis of both feet.   CDAI Exam: CDAI Score: -- Patient Global: --; Provider Global: -- Swollen: --; Tender: -- Joint Exam 03/19/2023   No joint exam has been documented for this visit   There is currently no information documented on the homunculus. Go to the Rheumatology activity and complete the homunculus joint exam.  Investigation: No additional findings.  Imaging: XR Foot 2 Views Left  Result Date: 03/19/2023 PIP and DIP narrowing was noted.  No MTP, intertarsal or tibiotalar joint space narrowing was noted.  Inferior calcaneal spur was noted.  No erosive changes were noted. Impression: X-rays were suggestive of early osteoarthritic changes.  XR Foot 2 Views Right  Result Date: 03/19/2023 PIP and DIP narrowing was noted.  No MTP, intertarsal or tibiotalar joint space narrowing was noted.  No erosive  changes were noted. Impression: X-rays were suggestive of early osteoarthritic changes.  XR Hand 2 View Left  Result Date: 03/19/2023 CMC, PIP and DIP narrowing was noted.  No MCP, intercarpal radiocarpal joint space narrowing was noted.  No erosive changes were noted. Impression: These findings are suggestive of osteoarthritis of the hand.  XR Hand 2 View Right  Result Date: 03/19/2023 CMC, PIP and DIP narrowing was noted.  No MCP, intercarpal or radiocarpal joint space narrowing was noted.  Hardware was noted in the right radius.  No erosive changes were noted. Impression: These findings were suggestive of osteoarthritis of the hand.  CUP PACEART REMOTE DEVICE CHECK  Result Date: 02/28/2023 ILR summary report received. Battery status OK. Normal device function. No new symptom, tachy, brady, or pause episodes. No new AF episodes. Monthly summary reports and ROV/PRN. MC, CVRS.   Recent Labs: Lab Results  Component Value Date   WBC 5.6 11/01/2022   HGB 11.6 (L) 11/01/2022   PLT 314 11/01/2022   NA 142 12/25/2022   K 4.2 12/25/2022   CL 101 12/25/2022   CO2 25 12/25/2022   GLUCOSE 117 (H) 12/25/2022   BUN 15 12/25/2022   CREATININE 0.99 12/25/2022   BILITOT 0.4 12/25/2022   ALKPHOS 116 12/25/2022   AST 25 12/25/2022   ALT 19 12/25/2022   PROT 7.3 12/25/2022   ALBUMIN 4.8 12/25/2022   CALCIUM 10.1 12/25/2022   GFRAA >90 07/07/2012   QFTBGOLDPLUS NEGATIVE 06/22/2022    Speciality Comments: Ocular toxicity from Plaquenil use MTX 6 tablets p.o. weekly caused muscle pain Imuran-myalgias  Procedures:  No procedures performed Allergies: Plaquenil [hydroxychloroquine], Sulfa antibiotics, and Penicillins   Assessment / Plan:     Visit Diagnoses: Lupus (HCC) - dxd at age 67.H/o +ANA, fatigue and myalgias.  PLQ discontinued in 2023 due to ocular toxicity: Patient presents today with increased pain and stiffness in both hands and both feet which started about 2 months ago.  She has  been unable to identify a trigger for the increase in symptoms.  On examination she has tenderness over the PIP and DIP joints in her right hand.  No tenderness or synovitis over MCP joints noted.  She has tenderness over the right second and third MTP joints but no synovitis or dactylitis was noted.  X-rays of both hands and feet were obtained  today for which were consistent with osteoarthritis.  No erosive changes were noted.  Plan on checking CRP, sed rate, RF, and anti-CCP today along with her routine lab work. Lab work from 10/19/22 was reviewed today in the office: dsDNA negative, complements WNL, and ESR WNL.   11/01/22: Beta-2 glycoprotein IgG remains borderline positive, anticardiolipin antibodies negative, and lupus anticoagulant negative.  She does not currently require immunosuppressive therapy.  Different treatment options were discussed today for management of osteoarthritis including referral to hand therapy.  Patient plans on performing home exercises and will notify us if she changes her mind about the referral. She was advised to notify us if she develops any other signs or symptoms of a flare.  Is in keeping her scheduled follow-up visit.  High risk medication use - Not currently taking any immunosuppressive agents. Discontinued imuran after 1 dose on 08/04/22 due to tremors and myalgias.  Methotrexate-myalgias. Discussed plaquenil due to toxic maculopathy.   Potential treatment options in the future if needed: arava or cellcept--not currently necessary.   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 Evaluated by Dr. Leonides Schanz on 11/01/2022.  Anticardiolipin antibodies negative and lupus anticoagulant negative on 11/01/2022.  She remains on Plavix as prescribed.  Pain in both hands -She is today with increased pain and  stiffness in both hands which started about 2 months ago with no identifiable trigger.  She has not been performing any repetitive or overuse activities.  Her pain has been a 6 out of 10 at times.  She is been experiencing nocturnal pain.  On examination she has tenderness over the right PIP and DIP joints.  Some prominence of the PIP and DIP joints noted consistent with osteoarthritis.  No obvious synovitis was noted.  No tenderness over the MTP joints.  Both wrist joints have good range of motion with no tenderness or synovitis.  X-rays of both hands updated today which were consistent with osteoarthritis.  No erosive changes were noted.  Plan on checking sed rate, CRP, anti-CCP, and RF.  She does not require immunosuppressive therapy at this time.  She was advised to notify us if her symptoms persist or worsen.  Different treatment options were discussed today including the importance of joint protection and muscle strengthening.  Offered a referral to hand therapy and she will notify us when and if she would like referral placed.  Plan: XR Hand 2 View Right, XR Hand 2 View Left  Pain in both feet -She presents today with increased pain and stiffness in both feet.  Her pain has been most severe in the right foot.  Both ankle joints have good range of motion with no tenderness or synovitis.  She has some tenderness over the second and third MTP joints of the right foot.  X-rays of both feet updated today-consistent with early osteoarthritis.  No erosive changes noted.  Different treatment options were discussed.  Plan: XR Foot 2 Views Right, XR Foot 2 Views Left  Cerebrovascular accident (CVA) due to thrombosis of right middle cerebral artery (HCC) - 05/07/2022 with left facial droop and dysarthria. Under the care of Dr. Epimenio Foot.  Degeneration of lumbar intervertebral disc - Under the care of Dr. Ethelene Hal.  Trochanteric bursitis, right hip: Not currently symptomatic.  Chronic pain of both knees: Good range of  motion of both knee joints on examination today.  No warmth or effusion noted.  Myalgia  Other medical conditions are listed as follows:   Primary hypertension: Blood pressure is 156/75 today in the office.  Stage 3a chronic kidney disease (HCC)  Nonrheumatic mitral valve regurgitation  Hyperlipidemia LDL goal <100  History of gastroesophageal reflux (GERD)  Gastroparesis  Peptic ulcer disease  History of IBS  History of osteopenia  Trigeminal neuralgia  Anemia, chronic disease  GAD (generalized anxiety disorder)  Recurrent major depressive disorder, in partial remission (HCC)   Orders: Orders Placed This Encounter  Procedures   XR Hand 2 View Right   XR Hand 2 View Left   XR Foot 2 Views Right   XR Foot 2 Views Left   Rheumatoid factor   Cyclic citrul peptide antibody, IgG   C-reactive protein   COMPLETE METABOLIC PANEL WITH GFR   CBC with Differential/Platelet   Protein / creatinine ratio, urine   ANA   C3 and C4   Anti-DNA antibody, double-stranded   Sedimentation rate   B. burgdorfi antibodies   No orders of the defined types were placed in this encounter.   Follow-Up Instructions: Return for Systemic lupus erythematosus.   Gearldine Bienenstock, PA-C  Note - This record has been created using Dragon software.  Chart creation errors have been sought, but may not always  have been located. Such creation errors do not reflect on  the standard of medical care.

## 2023-03-19 NOTE — Telephone Encounter (Signed)
Spoke with patient and scheduled an appointment for evaluation on 03/19/2023 @ 3:20 pm.

## 2023-03-20 ENCOUNTER — Telehealth: Payer: Self-pay | Admitting: Internal Medicine

## 2023-03-20 ENCOUNTER — Other Ambulatory Visit: Payer: Self-pay | Admitting: Internal Medicine

## 2023-03-20 DIAGNOSIS — F3341 Major depressive disorder, recurrent, in partial remission: Secondary | ICD-10-CM

## 2023-03-20 LAB — CBC WITH DIFFERENTIAL/PLATELET
Lymphs Abs: 1966 cells/uL (ref 850–3900)
Neutrophils Relative %: 51.3 %
RBC: 4.07 10*6/uL (ref 3.80–5.10)
Total Lymphocyte: 36.4 %
WBC: 5.4 10*3/uL (ref 3.8–10.8)

## 2023-03-20 LAB — COMPLETE METABOLIC PANEL WITH GFR
ALT: 15 U/L (ref 6–29)
Albumin: 4.6 g/dL (ref 3.6–5.1)
BUN: 18 mg/dL (ref 7–25)
Globulin: 2.8 g/dL (calc) (ref 1.9–3.7)
Glucose, Bld: 91 mg/dL (ref 65–99)
Sodium: 142 mmol/L (ref 135–146)
Total Bilirubin: 0.4 mg/dL (ref 0.2–1.2)

## 2023-03-20 LAB — C3 AND C4: C3 Complement: 135 mg/dL (ref 83–193)

## 2023-03-20 NOTE — Progress Notes (Signed)
GFR is borderline low-59. Creatinine is 1.05. avoid the use of NSAIDs.  Rest of CMP WNL.  Hemoglobin remains borderline low but stable. Rest of CBC WNL.  ESR and CRP WNL Protein creatinine ratio WNL.  RF negative  Complements WNL

## 2023-03-20 NOTE — Telephone Encounter (Signed)
Dr. Loreta Ave at [facility] is not taking any new patients.  Referral will need to be sent somewhere else.

## 2023-03-21 ENCOUNTER — Other Ambulatory Visit: Payer: Self-pay | Admitting: Internal Medicine

## 2023-03-21 DIAGNOSIS — K635 Polyp of colon: Secondary | ICD-10-CM

## 2023-03-21 LAB — ANTI-DNA ANTIBODY, DOUBLE-STRANDED: ds DNA Ab: <1 IU/mL

## 2023-03-21 LAB — COMPLETE METABOLIC PANEL WITH GFR
AG Ratio: 1.6 (calc) (ref 1.0–2.5)
AST: 22 U/L (ref 10–35)
Alkaline phosphatase (APISO): 94 U/L (ref 37–153)
CO2: 29 mmol/L (ref 20–32)
Calcium: 9.9 mg/dL (ref 8.6–10.4)
Chloride: 103 mmol/L (ref 98–110)
Creat: 1.05 mg/dL (ref 0.50–1.05)
Potassium: 3.9 mmol/L (ref 3.5–5.3)
Total Protein: 7.4 g/dL (ref 6.1–8.1)
eGFR: 59 mL/min/{1.73_m2} — ABNORMAL LOW (ref >=60)

## 2023-03-21 LAB — CBC WITH DIFFERENTIAL/PLATELET
Basophils Absolute: 32 cells/uL (ref 0–200)
Basophils Relative: 0.6 %
Eosinophils Relative: 1.3 %
HCT: 36.1 % (ref 35.0–45.0)
Hemoglobin: 11.6 g/dL — ABNORMAL LOW (ref 11.7–15.5)
MCH: 28.5 pg (ref 27.0–33.0)
MCV: 88.7 fL (ref 80.0–100.0)
MPV: 11 fL (ref 7.5–12.5)
Neutro Abs: 2770 cells/uL (ref 1500–7800)
RDW: 12.9 % (ref 11.0–15.0)

## 2023-03-21 LAB — C3 AND C4: C4 Complement: 23 mg/dL (ref 15–57)

## 2023-03-21 LAB — CYCLIC CITRUL PEPTIDE ANTIBODY, IGG: Cyclic Citrullin Peptide Ab: <16 UNITS

## 2023-03-21 LAB — PROTEIN / CREATININE RATIO, URINE
Creatinine, Urine: 103 mg/dL (ref 20–275)
Protein/Creat Ratio: 68 mg/g creat (ref 24–184)
Protein/Creatinine Ratio: 0.068 mg/mg creat (ref 0.024–0.184)
Total Protein, Urine: 7 mg/dL (ref 5–24)

## 2023-03-21 LAB — ANTI-NUCLEAR AB-TITER (ANA TITER): ANA Titer 1: 1:40 {titer} — ABNORMAL HIGH

## 2023-03-21 LAB — C-REACTIVE PROTEIN: CRP: 4.7 mg/L (ref <8.0)

## 2023-03-21 LAB — RHEUMATOID FACTOR: Rheumatoid fact SerPl-aCnc: <10 IU/mL (ref <14)

## 2023-03-21 LAB — B. BURGDORFI ANTIBODIES: B burgdorferi Ab IgG+IgM: <0.9 index

## 2023-03-21 LAB — ANA: Anti Nuclear Antibody (ANA): POSITIVE — AB

## 2023-03-21 NOTE — Progress Notes (Signed)
dsDNA is negative.  B burgdorferi antibodies negative

## 2023-03-21 NOTE — Progress Notes (Signed)
ANA Remains positive but is a low titer-unchanged.  No medication changes at this time.

## 2023-03-21 NOTE — Progress Notes (Signed)
Anti-CCP negative.

## 2023-03-28 ENCOUNTER — Encounter: Payer: Self-pay | Admitting: Gastroenterology

## 2023-04-02 NOTE — Progress Notes (Signed)
Carelink Summary Report / Loop Recorder 

## 2023-04-08 ENCOUNTER — Ambulatory Visit (INDEPENDENT_AMBULATORY_CARE_PROVIDER_SITE_OTHER): Payer: Medicare Other

## 2023-04-08 DIAGNOSIS — R55 Syncope and collapse: Secondary | ICD-10-CM | POA: Diagnosis not present

## 2023-04-08 LAB — CUP PACEART REMOTE DEVICE CHECK
Date Time Interrogation Session: 20240623230656
Implantable Pulse Generator Implant Date: 20220728

## 2023-04-29 NOTE — Progress Notes (Signed)
 [facility] Summary Report / Loop Recorder 

## 2023-05-13 ENCOUNTER — Ambulatory Visit (INDEPENDENT_AMBULATORY_CARE_PROVIDER_SITE_OTHER): Payer: Medicare Other

## 2023-05-13 DIAGNOSIS — R55 Syncope and collapse: Secondary | ICD-10-CM

## 2023-05-15 ENCOUNTER — Ambulatory Visit: Payer: Medicare Other | Admitting: Gastroenterology

## 2023-05-15 ENCOUNTER — Telehealth: Payer: Self-pay

## 2023-05-15 ENCOUNTER — Encounter: Payer: Self-pay | Admitting: Gastroenterology

## 2023-05-15 VITALS — BP 132/76 | HR 57 | Ht 62.0 in | Wt 144.8 lb

## 2023-05-15 DIAGNOSIS — Z86010 Personal history of colonic polyps: Secondary | ICD-10-CM

## 2023-05-15 DIAGNOSIS — R159 Full incontinence of feces: Secondary | ICD-10-CM

## 2023-05-15 DIAGNOSIS — R198 Other specified symptoms and signs involving the digestive system and abdomen: Secondary | ICD-10-CM | POA: Diagnosis not present

## 2023-05-15 DIAGNOSIS — K219 Gastro-esophageal reflux disease without esophagitis: Secondary | ICD-10-CM | POA: Diagnosis not present

## 2023-05-15 DIAGNOSIS — K5900 Constipation, unspecified: Secondary | ICD-10-CM

## 2023-05-15 DIAGNOSIS — Z7901 Long term (current) use of anticoagulants: Secondary | ICD-10-CM

## 2023-05-15 HISTORY — PX: CARPAL TUNNEL RELEASE: SHX101

## 2023-05-15 MED ORDER — NA SULFATE-K SULFATE-MG SULF 17.5-3.13-1.6 GM/177ML PO SOLN: 1.0000 | Freq: Once | ORAL | 0 refills | Status: AC

## 2023-05-15 MED ORDER — PANTOPRAZOLE SODIUM 40 MG PO TBEC: 40.0000 mg | DELAYED_RELEASE_TABLET | Freq: Every day | ORAL | 2 refills | Status: DC

## 2023-05-15 NOTE — Telephone Encounter (Signed)
[facility] Pre-operative Risk Assessment     Request for surgical clearance:     Endoscopy Procedure  What type of surgery is being performed?     Colonoscopy   When is this surgery scheduled?    07/09/2023  What type of clearance is required ?   Pharmacy  Are there any medications that need to be held prior to surgery and how long? Plavix- 5 days   Practice name and name of physician performing surgery?      [facility]  What is your office phone and fax number?      Phone- [phone]  Fax- [phone]  Anesthesia type (None, local, MAC, general) ?       MAC

## 2023-05-15 NOTE — Progress Notes (Signed)
Chief Complaint: IBS and repeat colonoscopy Primary GI MD: Gentry Fitz  HPI: 67 year old female history of hypertension, CVA (04/2022 - on Plavix), GERD, trigeminal neuralgia,, anemia of chronic disease CKD, depression, anxiety, presents for evaluation of IBS and repeat colonoscopy  Lab work 03/19/2023 Hemoglobin 11.6, MCV 88.7 CMP unrevealing CRP 4.7  Patient states she is here today to discuss repeating her colonoscopy.  She states her last 1 was done in [place] 5 years ago and she has a history of colon polyps and family history of colon cancer in her grandfather and some cousins.  She states she does have a history of IBS alternating with diarrhea and constipation.  She has associated lower abdominal discomfort that improves with a bowel movement.  She is not on anything for this at the moment.  She also endorses some episodes of nocturnal fecal incontinence.  She states that she has a longstanding history of GERD for which she takes pantoprazole 40 Mg once daily.  She does report previous EGDs which showed a hiatal hernia.  Also states she had a "tilt study" to evaluate her GERD.  She had been on pantoprazole 40 Mg once daily in addition to Nexium in the evening which really controlled her GERD.  She states her GERD is currently uncontrolled and when she goes to bend over she has regurgitation/chest pain.    She states she has chest pain with eating.  Very dependent on types of food she eats, what time she eats, and whether or not she lies down soon after eating.  Denies melena/hematochezia.  Denies weight loss.  Past Medical History:  Diagnosis Date   Closed fracture of right distal radius    Gastroparesis    GERD (gastroesophageal reflux disease)    Hypertension    Irritable bowel disease    Lupus (HCC)    Major depression    Stroke [facility])    Trigeminal neuralgia     Past Surgical History:  Procedure Laterality Date   BUBBLE STUDY  07/05/2022   Procedure: BUBBLE STUDY;   Surgeon: Parke Poisson, MD;  Location: [facility] ENDOSCOPY;  Service: Cardiology;;   CHOLECYSTECTOMY     COLONOSCOPY     LOOP RECORDER IMPLANT  2021   ORIF WRIST FRACTURE Right 09/22/2020   Procedure: OPEN REDUCTION INTERNAL FIXATION (ORIF) WRIST FRACTURE;  Surgeon: Ernest Mallick, MD;  Location: [facility];  Service: Orthopedics;  Laterality: Right;    TEE WITHOUT CARDIOVERSION N/A 07/05/2022   Procedure: TRANSESOPHAGEAL ECHOCARDIOGRAM (TEE);  Surgeon: Parke Poisson, MD;  Location: [facility] ENDOSCOPY;  Service: Cardiology;  Laterality: N/A;    Current Outpatient Medications  Medication Sig Dispense Refill   acetaminophen (TYLENOL) 325 MG tablet Take 650 mg by mouth as needed.     acyclovir (ZOVIRAX) 400 MG tablet Take 1 tablet (400 mg total) by mouth daily. 90 tablet 1   aspirin EC 81 MG tablet Take 1 tablet (81 mg total) by mouth daily. Swallow whole. (Patient taking differently: Take 81 mg by mouth at bedtime. Swallow whole.) 30 tablet 12   baclofen (LIORESAL) 10 MG tablet Take 1 tablet (10 mg total) by mouth 3 (three) times daily. (Patient taking differently: Take 10 mg by mouth as needed.) 90 each 5   carbamazepine (CARBATROL) 300 MG 12 hr capsule TAKE 1 CAPSULE(300 MG) BY MOUTH AT BEDTIME 90 capsule 0   clonazePAM (KLONOPIN) 0.5 MG tablet Take 1 tablet (0.5 mg total) by mouth at bedtime. 30 tablet 5  clopidogrel (PLAVIX) 75 MG tablet Take 1 tablet (75 mg total) by mouth daily. 30 tablet 11   diclofenac Sodium (VOLTAREN) 1 % GEL APPLY 2 GRAMS TO THE AFFECTED AREA(S) BY TOPICAL ROUTE 2-3 TIMES PER DAY     gabapentin (NEURONTIN) 300 MG capsule Take 600 mg by mouth 2 (two) times daily.     hydrochlorothiazide (HYDRODIURIL) 12.5 MG tablet Take 12.5 mg by mouth daily.     hydrOXYzine (ATARAX) 10 MG tablet Take 1 tablet (10 mg total) by mouth every 8 (eight) hours as needed for anxiety. 270 tablet 0   meclizine (ANTIVERT) 25 MG tablet Take 25 mg by mouth daily as  needed for dizziness.     pantoprazole (PROTONIX) 40 MG tablet Take 1 tablet (40 mg total) by mouth daily. 90 tablet 3   rosuvastatin (CRESTOR) 20 MG tablet Take 1 tablet (20 mg total) by mouth daily. 90 tablet 1   telmisartan-hydrochlorothiazide (MICARDIS HCT) 40-12.5 MG tablet Take 1 tablet by mouth daily. 90 tablet 0   tizanidine (ZANAFLEX) 2 MG capsule Take 4 mg by mouth at bedtime.     traMADol (ULTRAM) 50 MG tablet Take 50 mg by mouth 2 (two) times daily.     venlafaxine XR (EFFEXOR-XR) 37.5 MG 24 hr capsule TAKE 2 CAPSULES(75 MG) BY MOUTH AT BEDTIME 180 capsule 0   No current facility-administered medications for this visit.    Allergies as of 05/15/2023 - Review Complete 03/19/2023  Allergen Reaction Noted   Plaquenil [hydroxychloroquine]  01/02/2022   Sulfa antibiotics Nausea And Vomiting    Penicillins Rash 07/07/2012    Family History  Problem Relation Age of Onset   Heart block Mother        Pacemaker   Hypertension Mother    Depression Mother    Gout Mother    Macular degeneration Mother    Aneurysm Mother    Alcoholism Father    Congestive Heart Failure Maternal Grandmother    Diabetes Maternal Grandmother    Congestive Heart Failure Maternal Grandfather    Colon cancer Maternal Grandfather    Stroke Paternal Grandfather    Hypertension Paternal Grandfather     Social History   Socioeconomic History   Marital status: Widowed    Spouse name: Not on file   Number of children: 0   Years of education: Not on file   Highest education level: Bachelor's degree (e.g., BA, AB, BS)  Occupational History    Comment: RN ortho/neuro at [employer]  Tobacco Use   Smoking status: Never    Passive exposure: Never   Smokeless tobacco: Never  Vaping Use   Vaping status: Never Used  Substance and Sexual Activity   Alcohol use: Yes    Comment: social   Drug use: Never   Sexual activity: Not Currently    Birth control/protection: Post-menopausal  Other Topics Concern    Not on file  Social History Narrative   06/13/22 living with her mother   Social Determinants of Health   Financial Resource Strain: Low Risk  (10/24/2022)   Overall Financial Resource Strain (CARDIA)    Difficulty of Paying Living Expenses: Not hard at all  Food Insecurity: No Food Insecurity (10/24/2022)   Hunger Vital Sign    Worried About Running Out of Food in the Last Year: Never true    Ran Out of Food in the Last Year: Never true  Transportation Needs: No Transportation Needs (10/24/2022)   PRAPARE - Transportation    Lack of Transportation (  Medical): No    Lack of Transportation (Non-Medical): No  Physical Activity: Inactive (10/24/2022)   Exercise Vital Sign    Days of Exercise per Week: 0 days    Minutes of Exercise per Session: 0 min  Stress: No Stress Concern Present (10/24/2022)   [employer] of Occupational Health - Occupational Stress Questionnaire    Feeling of Stress : Only a little  Social Connections: Moderately Integrated (10/24/2022)   Social Connection and Isolation Panel [NHANES]    Frequency of Communication with Friends and Family: More than three times a week    Frequency of Social Gatherings with Friends and Family: Three times a week    Attends Religious Services: More than 4 times per year    Active Member of Clubs or Organizations: Yes    Attends Banker Meetings: More than 4 times per year    Marital Status: Widowed  Intimate Partner Violence: Not At Risk (10/24/2022)   Humiliation, Afraid, Rape, and Kick questionnaire    Fear of Current or Ex-Partner: No    Emotionally Abused: No    Physically Abused: No    Sexually Abused: No    Review of Systems:    Constitutional: No weight loss, fever, chills, weakness or fatigue HEENT: Eyes: No change in vision               Ears, Nose, Throat:  No change in hearing or congestion Skin: No rash or itching Cardiovascular: No chest pain, chest pressure or palpitations   Respiratory: No SOB or  cough Gastrointestinal: See HPI and otherwise negative Genitourinary: No dysuria or change in urinary frequency Neurological: No headache, dizziness or syncope Musculoskeletal: No new muscle or joint pain Hematologic: No bleeding or bruising Psychiatric: No history of depression or anxiety    Physical Exam:  Vital signs: There were no vitals taken for this visit.  Constitutional: NAD, Well developed, Well nourished, alert and cooperative Head:  Normocephalic and atraumatic. Eyes:   PEERL, EOMI. No icterus. Conjunctiva pink. Respiratory: Respirations even and unlabored. Lungs clear to auscultation bilaterally.   No wheezes, crackles, or rhonchi.  Cardiovascular:  Regular rate and rhythm. No peripheral edema, cyanosis or pallor.  Gastrointestinal:  Soft, nondistended, nontender. No rebound or guarding. Normal bowel sounds. No appreciable masses or hepatomegaly. Rectal:  Not performed.  Msk:  Symmetrical without gross deformities. Without edema, no deformity or joint abnormality.  Neurologic:  Alert and  oriented x4;  grossly normal neurologically.  Skin:   Dry and intact without significant lesions or rashes. Psychiatric: Oriented to person, place and time. Demonstrates good judgement and reason without abnormal affect or behaviors.   RELEVANT LABS AND IMAGING: CBC    Component Value Date/Time   WBC 5.4 03/19/2023 1639   RBC 4.07 03/19/2023 1639   HGB 11.6 (L) 03/19/2023 1639   HGB 11.6 (L) 11/01/2022 1333   HGB 12.2 07/02/2022 0812   HCT 36.1 03/19/2023 1639   HCT 36.5 07/02/2022 0812   PLT 290 03/19/2023 1639   PLT 314 11/01/2022 1333   PLT 373 07/02/2022 0812   MCV 88.7 03/19/2023 1639   MCV 92 07/02/2022 0812   MCH 28.5 03/19/2023 1639   MCHC 32.1 03/19/2023 1639   RDW 12.9 03/19/2023 1639   RDW 13.1 07/02/2022 0812   LYMPHSABS 1,966 03/19/2023 1639   MONOABS 0.7 11/01/2022 1333   EOSABS 70 03/19/2023 1639   BASOSABS 32 03/19/2023 1639    CMP     Component  Value Date/Time  NA 142 03/19/2023 1639   NA 142 12/25/2022 1047   K 3.9 03/19/2023 1639   CL 103 03/19/2023 1639   CO2 29 03/19/2023 1639   GLUCOSE 91 03/19/2023 1639   BUN 18 03/19/2023 1639   BUN 15 12/25/2022 1047   CREATININE 1.05 03/19/2023 1639   CALCIUM 9.9 03/19/2023 1639   PROT 7.4 03/19/2023 1639   PROT 7.3 12/25/2022 1047   ALBUMIN 4.8 12/25/2022 1047   AST 22 03/19/2023 1639   AST 24 11/01/2022 1333   ALT 15 03/19/2023 1639   ALT 18 11/01/2022 1333   ALKPHOS 116 12/25/2022 1047   BILITOT 0.4 03/19/2023 1639   BILITOT 0.4 12/25/2022 1047   BILITOT 0.4 11/01/2022 1333   GFRNONAA >60 11/01/2022 1333   GFRAA >90 07/07/2012 0610     Assessment/Plan:   Gastroesophageal reflux disease without esophagitis Currently not well-controlled on pantoprazole 40 Mg once daily.  Previous EGD with hiatal hernia.  We will obtain these records. --- EGD for further evaluation of gastritis, esophagitis, PUD, hiatal hernia --- Educated patient on lifestyle modifications and provided patient education handout in regards to GERD --- Increase pantoprazole 40 Mg to twice daily --- I thoroughly discussed the procedure with the patient (at bedside) to include nature of the procedure, alternatives, benefits, and risks (including but not limited to bleeding, infection, perforation, anesthesia/cardiac pulmonary complications).  Patient verbalized understanding and gave verbal consent to proceed with procedure.   Hx of colonic polyps Will obtain previous colonoscopy  Constipation, unspecified constipation type Alternating diarrhea and constipation Incontinence of feces, unspecified fecal incontinence type --- Start fiber supplement daily (Benefiber) --- Can use IBgard for pain as needed.  IBgard is peppermint oil also may exacerbate GERD symptoms. --- Squatty potty to help with bowel movements --- Colonoscopy for further evaluation of alternating diarrhea/constipation, incontinence of feces,  and history of colonic polyps --- I thoroughly discussed the procedure with the patient (at bedside) to include nature of the procedure, alternatives, benefits, and risks (including but not limited to bleeding, infection, perforation, anesthesia/cardiac pulmonary complications).  Patient verbalized understanding and gave verbal consent to proceed with procedure.   Chronic anticoagulation Previous stroke 04/2023 on Plavix --- Will obtain blood thinner sees from neurologist to hold Plavix for 5 days prior to procedure  [facility] 05/15/2023, 12:48 PM  Cc: Etta Grandchild, MD

## 2023-05-15 NOTE — Patient Instructions (Addendum)
You have been scheduled for a colonoscopy. Please follow written instructions given to you at your visit today.   Please pick up your prep supplies at the pharmacy within the next 1-3 days.  If you use inhalers (even only as needed), please bring them with you on the day of your procedure.  DO NOT TAKE 7 DAYS PRIOR TO TEST- Trulicity (dulaglutide) Ozempic, Wegovy (semaglutide) Mounjaro (tirzepatide) Bydureon Bcise (exanatide extended release)  DO NOT TAKE 1 DAY PRIOR TO YOUR TEST Rybelsus (semaglutide) Adlyxin (lixisenatide) Victoza (liraglutide) Byetta (exanatide) ___________________________________________________________________________   If your blood pressure at your visit was 140/90 or greater, please contact your primary care physician to follow up on this.  _______________________________________________________  If you are age 34 or older, your body mass index should be between 23-30. Your Body mass index is 26.48 kg/m. If this is out of the aforementioned range listed, please consider follow up with your Primary Care Provider.  If you are age 80 or younger, your body mass index should be between 19-25. Your Body mass index is 26.48 kg/m. If this is out of the aformentioned range listed, please consider follow up with your Primary Care Provider.   ________________________________________________________  The [facility] providers would like to encourage you to use [facility] to communicate with providers for non-urgent requests or questions.  Due to long hold times on the telephone, sending your provider a message by [facility] may be a faster and more efficient way to get a response.  Please allow 48 business hours for a response.  Please remember that this is for non-urgent requests.  _______________________________________________________ It was a pleasure to see you today!  Thank you for trusting me with your gastrointestinal care!

## 2023-05-16 ENCOUNTER — Other Ambulatory Visit: Payer: Self-pay

## 2023-05-16 ENCOUNTER — Telehealth: Payer: Self-pay

## 2023-05-16 MED ORDER — PANTOPRAZOLE SODIUM 40 MG PO TBEC: 40.0000 mg | DELAYED_RELEASE_TABLET | Freq: Two times a day (BID) | ORAL | 3 refills | Status: DC

## 2023-05-16 NOTE — Telephone Encounter (Signed)
Called patient to let her know about her 5 day Plavix hold, left voicemail to call back.

## 2023-05-16 NOTE — Telephone Encounter (Signed)
Called patient to let her know of her 5 day hold on Plavix-patient verbalized understanding

## 2023-05-16 NOTE — Progress Notes (Signed)
Attending Physician's Attestation   I have reviewed the chart.   I agree with the Advanced Practitioner's note, impression, and recommendations with any updates as below.    Gabriel Mansouraty, MD [facility] Advanced [facility]oscopy Office # [phone]  

## 2023-05-23 ENCOUNTER — Ambulatory Visit: Payer: Medicare Other | Admitting: Internal Medicine

## 2023-05-30 ENCOUNTER — Encounter (INDEPENDENT_AMBULATORY_CARE_PROVIDER_SITE_OTHER): Payer: Self-pay

## 2023-05-30 NOTE — Progress Notes (Signed)
Carelink Summary Report / Loop Recorder 

## 2023-06-03 NOTE — Progress Notes (Unsigned)
Office Visit Note  Patient: Debbie Hatfield             Date of Birth: 09/02/1956           MRN: [id number]             PCP: Etta Grandchild, MD Referring: Etta Grandchild, MD Visit Date: 06/06/2023 Occupation: @GUAROCC @  Subjective:  No chief complaint on file.   History of Present Illness: Debbie Hatfield is a 67 y.o. female ***     Activities of Daily Living:  Patient reports morning stiffness for *** {minute/hour:19697}.   Patient {ACTIONS;DENIES/REPORTS:21021675::"Denies"} nocturnal pain.  Difficulty dressing/grooming: {ACTIONS;DENIES/REPORTS:21021675::"Denies"} Difficulty climbing stairs: {ACTIONS;DENIES/REPORTS:21021675::"Denies"} Difficulty getting out of chair: {ACTIONS;DENIES/REPORTS:21021675::"Denies"} Difficulty using hands for taps, buttons, cutlery, and/or writing: {ACTIONS;DENIES/REPORTS:21021675::"Denies"}  No Rheumatology ROS completed.   PMFS History:  Patient Active Problem List   Diagnosis Date Noted   Nonrheumatic mitral valve regurgitation 05/10/2022   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   Anemia, chronic disease 05/06/2022   Hyperlipidemia LDL goal <100 03/26/2022   Recurrent major depressive disorder, in partial remission (HCC) 11/15/2021   Need for shingles vaccine 11/15/2021   Visit for screening mammogram 11/15/2021   Need for vaccination 11/15/2021   GAD (generalized anxiety disorder) 11/15/2021   Stage 3a chronic kidney disease (HCC) 11/15/2021   Trigeminal neuralgia    Lupus (HCC)    Irritable bowel disease    GERD (gastroesophageal reflux disease)    Hypertension    Gastroparesis    Degeneration of lumbar intervertebral disc 01/26/2020   Osteopenia 12/14/2015   Peptic ulcer disease 12/14/2015    Past Medical History:  Diagnosis Date   Closed fracture of right distal radius    Gastroparesis    GERD (gastroesophageal reflux disease)    Hypertension    Irritable bowel disease    Lupus (HCC)    Major depression    Stroke (HCC)     Trigeminal neuralgia     Family History  Problem Relation Age of Onset   Heart block Mother        Pacemaker   Hypertension Mother    Depression Mother    Gout Mother    Macular degeneration Mother    Aneurysm Mother    Alcoholism Father    Congestive Heart Failure Maternal Grandmother    Diabetes Maternal Grandmother    Congestive Heart Failure Maternal Grandfather    Colon cancer Maternal Grandfather    Stroke Paternal Grandfather    Hypertension Paternal Grandfather    Esophageal cancer Neg Hx    Stomach cancer Neg Hx    Past Surgical History:  Procedure Laterality Date   BUBBLE STUDY  07/05/2022   Procedure: BUBBLE STUDY;  Surgeon: Parke Poisson, MD;  Location: [facility] ENDOSCOPY;  Service: Cardiology;;   CARPAL TUNNEL RELEASE     CHOLECYSTECTOMY     COLONOSCOPY     LOOP RECORDER IMPLANT  2021   ORIF WRIST FRACTURE Right 09/22/2020   Procedure: OPEN REDUCTION INTERNAL FIXATION (ORIF) WRIST FRACTURE;  Surgeon: Ernest Mallick, MD;  Location: [facility];  Service: Orthopedics;  Laterality: Right;    TEE WITHOUT CARDIOVERSION N/A 07/05/2022   Procedure: TRANSESOPHAGEAL ECHOCARDIOGRAM (TEE);  Surgeon: Parke Poisson, MD;  Location: [facility] ENDOSCOPY;  Service: Cardiology;  Laterality: N/A;   Social History   Social History Narrative   06/13/22 living with her mother   Immunization History  Administered Date(s) Administered   Influenza,inj,Quad PF,6-35 Mos 06/17/2019  Influenza-Unspecified 09/02/2020, 07/27/2021, 07/15/2022   Moderna SARS-COV2 Booster Vaccination 09/02/2020   Moderna Sars-Covid-2 Vaccination 10/23/2019, 11/20/2019   PNEUMOCOCCAL CONJUGATE-20 11/15/2021   Tdap 03/28/2014, 12/29/2020     Objective: Vital Signs: There were no vitals taken for this visit.   Physical Exam   Musculoskeletal Exam: ***  CDAI Exam: CDAI Score: -- Patient Global: --; Provider Global: -- Swollen: --; Tender: -- Joint Exam 06/06/2023   No  joint exam has been documented for this visit   There is currently no information documented on the homunculus. Go to the Rheumatology activity and complete the homunculus joint exam.  Investigation: No additional findings.  Imaging: CUP PACEART REMOTE DEVICE CHECK  Result Date: 05/13/2023 ILR summary report received. Battery status OK. Normal device function. No new symptom, tachy, brady, or pause episodes. No new AF episodes. AF burden is 0% of the time.  Monthly summary reports and ROV/PRN Hassell Halim, RN, CCDS, [employer]   Recent Labs: Lab Results  Component Value Date   WBC 5.4 03/19/2023   HGB 11.6 (L) 03/19/2023   PLT 290 03/19/2023   NA 142 03/19/2023   K 3.9 03/19/2023   CL 103 03/19/2023   CO2 29 03/19/2023   GLUCOSE 91 03/19/2023   BUN 18 03/19/2023   CREATININE 1.05 03/19/2023   BILITOT 0.4 03/19/2023   ALKPHOS 116 12/25/2022   AST 22 03/19/2023   ALT 15 03/19/2023   PROT 7.4 03/19/2023   ALBUMIN 4.8 12/25/2022   CALCIUM 9.9 03/19/2023   GFRAA >90 07/07/2012   QFTBGOLDPLUS NEGATIVE 06/22/2022    Speciality Comments: Ocular toxicity from Plaquenil use MTX 6 tablets p.o. weekly caused muscle pain Imuran-myalgias  Procedures:  No procedures performed Allergies: Plaquenil [hydroxychloroquine], Sulfa antibiotics, and Penicillins   Assessment / Plan:     Visit Diagnoses: No diagnosis found.  Orders: No orders of the defined types were placed in this encounter.  No orders of the defined types were placed in this encounter.   Face-to-face time spent with patient was *** minutes. Greater than 50% of time was spent in counseling and coordination of care.  Follow-Up Instructions: No follow-ups on file.   Ellen Henri, CMA  Note - This record has been created using Animal nutritionist.  Chart creation errors have been sought, but may not always  have been located. Such creation errors do not reflect on  the standard of medical care.

## 2023-06-06 ENCOUNTER — Ambulatory Visit: Payer: Medicare Other | Attending: Rheumatology | Admitting: Physician Assistant

## 2023-06-06 ENCOUNTER — Encounter: Payer: Self-pay | Admitting: Physician Assistant

## 2023-06-06 VITALS — BP 150/75 | HR 58 | Resp 16 | Ht 62.0 in | Wt 142.4 lb

## 2023-06-06 DIAGNOSIS — M329 Systemic lupus erythematosus, unspecified: Secondary | ICD-10-CM | POA: Insufficient documentation

## 2023-06-06 DIAGNOSIS — M25561 Pain in right knee: Secondary | ICD-10-CM | POA: Insufficient documentation

## 2023-06-06 DIAGNOSIS — D638 Anemia in other chronic diseases classified elsewhere: Secondary | ICD-10-CM | POA: Insufficient documentation

## 2023-06-06 DIAGNOSIS — N1831 Chronic kidney disease, stage 3a: Secondary | ICD-10-CM | POA: Insufficient documentation

## 2023-06-06 DIAGNOSIS — Z79899 Other long term (current) drug therapy: Secondary | ICD-10-CM | POA: Diagnosis present

## 2023-06-06 DIAGNOSIS — Z8719 Personal history of other diseases of the digestive system: Secondary | ICD-10-CM | POA: Insufficient documentation

## 2023-06-06 DIAGNOSIS — M79672 Pain in left foot: Secondary | ICD-10-CM | POA: Insufficient documentation

## 2023-06-06 DIAGNOSIS — E785 Hyperlipidemia, unspecified: Secondary | ICD-10-CM | POA: Insufficient documentation

## 2023-06-06 DIAGNOSIS — I63311 Cerebral infarction due to thrombosis of right middle cerebral artery: Secondary | ICD-10-CM | POA: Diagnosis present

## 2023-06-06 DIAGNOSIS — I340 Nonrheumatic mitral (valve) insufficiency: Secondary | ICD-10-CM | POA: Insufficient documentation

## 2023-06-06 DIAGNOSIS — T372X5A Adverse effect of antimalarials and drugs acting on other blood protozoa, initial encounter: Secondary | ICD-10-CM | POA: Diagnosis present

## 2023-06-06 DIAGNOSIS — K3184 Gastroparesis: Secondary | ICD-10-CM | POA: Insufficient documentation

## 2023-06-06 DIAGNOSIS — G8929 Other chronic pain: Secondary | ICD-10-CM | POA: Diagnosis present

## 2023-06-06 DIAGNOSIS — M5136 Other intervertebral disc degeneration, lumbar region: Secondary | ICD-10-CM | POA: Diagnosis present

## 2023-06-06 DIAGNOSIS — H35389 Toxic maculopathy, unspecified eye: Secondary | ICD-10-CM | POA: Insufficient documentation

## 2023-06-06 DIAGNOSIS — M7061 Trochanteric bursitis, right hip: Secondary | ICD-10-CM | POA: Insufficient documentation

## 2023-06-06 DIAGNOSIS — G500 Trigeminal neuralgia: Secondary | ICD-10-CM | POA: Insufficient documentation

## 2023-06-06 DIAGNOSIS — M79671 Pain in right foot: Secondary | ICD-10-CM | POA: Insufficient documentation

## 2023-06-06 DIAGNOSIS — F411 Generalized anxiety disorder: Secondary | ICD-10-CM | POA: Insufficient documentation

## 2023-06-06 DIAGNOSIS — M79642 Pain in left hand: Secondary | ICD-10-CM | POA: Insufficient documentation

## 2023-06-06 DIAGNOSIS — M791 Myalgia, unspecified site: Secondary | ICD-10-CM | POA: Insufficient documentation

## 2023-06-06 DIAGNOSIS — Z8739 Personal history of other diseases of the musculoskeletal system and connective tissue: Secondary | ICD-10-CM | POA: Insufficient documentation

## 2023-06-06 DIAGNOSIS — M79641 Pain in right hand: Secondary | ICD-10-CM | POA: Insufficient documentation

## 2023-06-06 DIAGNOSIS — F3341 Major depressive disorder, recurrent, in partial remission: Secondary | ICD-10-CM | POA: Insufficient documentation

## 2023-06-06 DIAGNOSIS — M25562 Pain in left knee: Secondary | ICD-10-CM | POA: Insufficient documentation

## 2023-06-06 DIAGNOSIS — I10 Essential (primary) hypertension: Secondary | ICD-10-CM | POA: Insufficient documentation

## 2023-06-06 DIAGNOSIS — K279 Peptic ulcer, site unspecified, unspecified as acute or chronic, without hemorrhage or perforation: Secondary | ICD-10-CM | POA: Diagnosis present

## 2023-06-06 DIAGNOSIS — R760 Raised antibody titer: Secondary | ICD-10-CM | POA: Diagnosis present

## 2023-06-06 NOTE — Progress Notes (Signed)
CBC and CMP WNL

## 2023-06-06 NOTE — Addendum Note (Signed)
Addended by: Ellen Henri on: 06/06/2023 09:48 AM   Modules accepted: Orders

## 2023-06-07 LAB — COMPLETE METABOLIC PANEL WITH GFR
AG Ratio: 1.8 (calc) (ref 1.0–2.5)
ALT: 20 U/L (ref 6–29)
AST: 23 U/L (ref 10–35)
Albumin: 4.6 g/dL (ref 3.6–5.1)
Alkaline phosphatase (APISO): 91 U/L (ref 37–153)
BUN: 23 mg/dL (ref 7–25)
CO2: 30 mmol/L (ref 20–32)
Calcium: 9.5 mg/dL (ref 8.6–10.4)
Chloride: 101 mmol/L (ref 98–110)
Creat: 0.93 mg/dL (ref 0.50–1.05)
Globulin: 2.6 g/dL (ref 1.9–3.7)
Glucose, Bld: 92 mg/dL (ref 65–99)
Potassium: 4.2 mmol/L (ref 3.5–5.3)
Sodium: 139 mmol/L (ref 135–146)
Total Bilirubin: 0.5 mg/dL (ref 0.2–1.2)
Total Protein: 7.2 g/dL (ref 6.1–8.1)
eGFR: 68 mL/min/{1.73_m2} (ref >=60)

## 2023-06-07 LAB — CBC WITH DIFFERENTIAL/PLATELET
Absolute Monocytes: 745 {cells}/uL (ref 200–950)
Basophils Absolute: 39 {cells}/uL (ref 0–200)
Basophils Relative: 0.8 %
Eosinophils Absolute: 123 {cells}/uL (ref 15–500)
Eosinophils Relative: 2.5 %
HCT: 36.7 % (ref 35.0–45.0)
Hemoglobin: 11.7 g/dL (ref 11.7–15.5)
Lymphs Abs: 2063 {cells}/uL (ref 850–3900)
MCH: 29.3 pg (ref 27.0–33.0)
MCHC: 31.9 g/dL — ABNORMAL LOW (ref 32.0–36.0)
MCV: 92 fL (ref 80.0–100.0)
MPV: 10.8 fL (ref 7.5–12.5)
Monocytes Relative: 15.2 %
Neutro Abs: 1931 {cells}/uL (ref 1500–7800)
Neutrophils Relative %: 39.4 %
Platelets: 357 10*3/uL (ref 140–400)
RBC: 3.99 10*6/uL (ref 3.80–5.10)
RDW: 12.8 % (ref 11.0–15.0)
Total Lymphocyte: 42.1 %
WBC: 4.9 10*3/uL (ref 3.8–10.8)

## 2023-06-07 LAB — PROTEIN / CREATININE RATIO, URINE
Creatinine, Urine: 102 mg/dL (ref 20–275)
Protein/Creat Ratio: 69 mg/g{creat} (ref 24–184)
Protein/Creatinine Ratio: 0.069 mg/mg{creat} (ref 0.024–0.184)
Total Protein, Urine: 7 mg/dL (ref 5–24)

## 2023-06-07 LAB — C3 AND C4
C3 Complement: 136 mg/dL (ref 83–193)
C4 Complement: 21 mg/dL (ref 15–57)

## 2023-06-07 LAB — SEDIMENTATION RATE: Sed Rate: 11 mm/h (ref 0–30)

## 2023-06-07 LAB — ANTI-DNA ANTIBODY, DOUBLE-STRANDED: ds DNA Ab: <1 [IU]/mL

## 2023-06-07 NOTE — Progress Notes (Signed)
ESR WNL.  Complements WNL.  Protein creatinine ratio WNL.

## 2023-06-10 NOTE — Progress Notes (Signed)
dsDNA is negative.  Labs are not consistent with a flare.

## 2023-06-13 LAB — CUP PACEART REMOTE DEVICE CHECK
Date Time Interrogation Session: 20240828230635
Implantable Pulse Generator Implant Date: 20220728

## 2023-06-18 ENCOUNTER — Ambulatory Visit (INDEPENDENT_AMBULATORY_CARE_PROVIDER_SITE_OTHER): Payer: Medicare Other

## 2023-06-18 DIAGNOSIS — R55 Syncope and collapse: Secondary | ICD-10-CM | POA: Diagnosis not present

## 2023-06-21 ENCOUNTER — Other Ambulatory Visit: Payer: Self-pay | Admitting: Internal Medicine

## 2023-06-21 DIAGNOSIS — A6000 Herpesviral infection of urogenital system, unspecified: Secondary | ICD-10-CM

## 2023-06-27 ENCOUNTER — Other Ambulatory Visit: Payer: Self-pay | Admitting: Internal Medicine

## 2023-06-27 DIAGNOSIS — I10 Essential (primary) hypertension: Secondary | ICD-10-CM

## 2023-07-01 ENCOUNTER — Ambulatory Visit: Payer: Medicare Other | Admitting: Internal Medicine

## 2023-07-01 NOTE — Progress Notes (Signed)
Carelink Summary Report / Loop Recorder 

## 2023-07-09 ENCOUNTER — Ambulatory Visit: Payer: Medicare Other | Admitting: Gastroenterology

## 2023-07-09 ENCOUNTER — Encounter: Payer: Self-pay | Admitting: Gastroenterology

## 2023-07-09 VITALS — BP 180/80 | HR 68 | Temp 97.1°F | Resp 12 | Ht 62.0 in | Wt 142.0 lb

## 2023-07-09 DIAGNOSIS — K219 Gastro-esophageal reflux disease without esophagitis: Secondary | ICD-10-CM

## 2023-07-09 DIAGNOSIS — K2289 Other specified disease of esophagus: Secondary | ICD-10-CM | POA: Diagnosis not present

## 2023-07-09 DIAGNOSIS — K2970 Gastritis, unspecified, without bleeding: Secondary | ICD-10-CM

## 2023-07-09 DIAGNOSIS — D127 Benign neoplasm of rectosigmoid junction: Secondary | ICD-10-CM | POA: Diagnosis not present

## 2023-07-09 DIAGNOSIS — K317 Polyp of stomach and duodenum: Secondary | ICD-10-CM | POA: Diagnosis not present

## 2023-07-09 DIAGNOSIS — K222 Esophageal obstruction: Secondary | ICD-10-CM

## 2023-07-09 DIAGNOSIS — Z86010 Personal history of colonic polyps: Secondary | ICD-10-CM

## 2023-07-09 DIAGNOSIS — Z09 Encounter for follow-up examination after completed treatment for conditions other than malignant neoplasm: Secondary | ICD-10-CM | POA: Diagnosis not present

## 2023-07-09 DIAGNOSIS — K449 Diaphragmatic hernia without obstruction or gangrene: Secondary | ICD-10-CM

## 2023-07-09 MED ORDER — SODIUM CHLORIDE 0.9 % IV SOLN: 500.0000 mL | Freq: Once | INTRAVENOUS | Status: DC

## 2023-07-09 NOTE — Progress Notes (Unsigned)
Report to PACU, RN, vss, BBS= Clear.  

## 2023-07-09 NOTE — Op Note (Signed)
[facility] Patient Name: Debbie Hatfield Procedure Date: 07/09/2023 2:54 PM MRN: [id number] Endoscopist: Corliss Parish , MD, [id number] Age: 67 Referring MD:  Date of Birth: 02-05-1956 Gender: Female Account #: [id number] Procedure:                Upper GI endoscopy Indications:              Epigastric abdominal pain, Dysphagia, Esophageal                            reflux symptoms that persist despite appropriate                            therapy, Change in bowel habits Medicines:                Monitored Anesthesia Care Procedure:                Pre-Anesthesia Assessment:                           - Prior to the procedure, a History and Physical                            was performed, and patient medications and                            allergies were reviewed. The patient's tolerance of                            previous anesthesia was also reviewed. The risks                            and benefits of the procedure and the sedation                            options and risks were discussed with the patient.                            All questions were answered, and informed consent                            was obtained. Prior Anticoagulants: The patient has                            taken no anticoagulant or antiplatelet agents                            except for aspirin. ASA Grade Assessment: II - A                            patient with mild systemic disease. After reviewing                            the risks and benefits, the patient was deemed in  satisfactory condition to undergo the procedure.                           After obtaining informed consent, the endoscope was                            passed under direct vision. Throughout the                            procedure, the patient's blood pressure, pulse, and                            oxygen saturations were monitored continuously. The                             GIF [id number] #[id number] was introduced through the                            mouth, and advanced to the second part of duodenum.                            The upper GI endoscopy was accomplished without                            difficulty. The patient tolerated the procedure. Scope In: Scope Out: Findings:                 No gross lesions were noted in the entire                            esophagus. Biopsies were taken with a cold forceps                            for histology for rule out of EOE/LOE. After the                            rest of the EGD was completed, a guidewire was                            placed and the scope was withdrawn. Dilation was                            performed with a Savary dilator with no resistance                            at 18 mm. The dilation site was examined following                            endoscope reinsertion and showed no change.                           A non-obstructing Schatzki ring was found at the  gastroesophageal junction. Disruption biopsies were                            taken with a cold forceps circumferentially.                           The Z-line was irregular and was found 34 cm from                            the incisors.                           A 2 cm hiatal hernia was present.                           Patchy mildly erythematous mucosa without bleeding                            was found in the entire examined stomach. Biopsies                            were taken with a cold forceps for histology and                            Helicobacter pylori testing.                           Multiple 5 to 8 mm semi-sessile polyps were found                            in the gastric body. Biopsies were taken with a                            cold forceps for histology.                           No gross lesions were noted in the duodenal bulb,                            in the first portion of  the duodenum and in the                            second portion of the duodenum. Biopsies were taken                            with a cold forceps for histology. Complications:            No immediate complications. Estimated Blood Loss:     Estimated blood loss was minimal. Impression:               - No gross lesions in the entire esophagus.                            Biopsied. Dilated to 18 mm savory.                           -  Non-obstructing Schatzki ring. Disrupted..                           - Z-line irregular, 34 cm from the incisors.                           - 2 cm hiatal hernia.                           - Erythematous mucosa in the stomach. Biopsied.                           - Multiple gastric polyps - likely fundic gland                            based on endoscopic imaging, biopsied.                           - No gross lesions in the duodenal bulb, in the                            first portion of the duodenum and in the second                            portion of the duodenum. Biopsied. Recommendation:           - Proceed to scheduled colonoscopy.                           - Continue present medications.                           - Await pathology results.                           - Observe patient's clinical course.                           - Repeat upper endoscopy PRN for retreatment if                            there is improvement in symptoms..                           - If issues of dysphagia do not have improvement                            after dilation, consider esophageal manometry for                            further evaluation and rule out of dysmotility.                           - Etiology of symptoms seems less likely to be  associated with this small hiatal hernia, though                            not impossible. If issues persist in regards to mid                            epigastric/subxiphoid pain at times,  consider                            performing CTAP with IV and oral contrast to ensure                            nothing else is potentially causing her issues                            before we consider role of hiatal hernia repair                            discussion with surgery versus TIF plus hiatal                            hernia repair.                           - The findings and recommendations were discussed                            with the patient.                           - The findings and recommendations were discussed                            with the patient's family. Corliss Parish, MD 07/09/2023 3:43:25 PM

## 2023-07-09 NOTE — Progress Notes (Unsigned)
GASTROENTEROLOGY PROCEDURE H&P NOTE   Primary Care Physician: Etta Grandchild, MD  HPI: Debbie Hatfield is a 67 y.o. female who presents for EGD/Colonoscopy to evaluate GERD not controlled on current therapy and for alteration of bowel habits and fecal incontinence and history of previous colon polyps.  Past Medical History:  Diagnosis Date   Closed fracture of right distal radius    Gastroparesis    GERD (gastroesophageal reflux disease)    Hypertension    Irritable bowel disease    Lupus (HCC)    Major depression    Stroke [facility])    Trigeminal neuralgia    Past Surgical History:  Procedure Laterality Date   BUBBLE STUDY  07/05/2022   Procedure: BUBBLE STUDY;  Surgeon: Parke Poisson, MD;  Location: [facility] ENDOSCOPY;  Service: Cardiology;;   CARPAL TUNNEL RELEASE Left 05/15/2023   CHOLECYSTECTOMY     COLONOSCOPY     LOOP RECORDER IMPLANT  2021   ORIF WRIST FRACTURE Right 09/22/2020   Procedure: OPEN REDUCTION INTERNAL FIXATION (ORIF) WRIST FRACTURE;  Surgeon: Ernest Mallick, MD;  Location: [facility];  Service: Orthopedics;  Laterality: Right;    TEE WITHOUT CARDIOVERSION N/A 07/05/2022   Procedure: TRANSESOPHAGEAL ECHOCARDIOGRAM (TEE);  Surgeon: Parke Poisson, MD;  Location: [facility] ENDOSCOPY;  Service: Cardiology;  Laterality: N/A;   Current Outpatient Medications  Medication Sig Dispense Refill   acetaminophen (TYLENOL) 325 MG tablet Take 650 mg by mouth as needed.     acyclovir (ZOVIRAX) 400 MG tablet TAKE 1 TABLET(400 MG) BY MOUTH DAILY 90 tablet 1   baclofen (LIORESAL) 10 MG tablet Take 1 tablet (10 mg total) by mouth 3 (three) times daily. (Patient taking differently: Take 10 mg by mouth as needed.) 90 each 5   carbamazepine (CARBATROL) 300 MG 12 hr capsule TAKE 1 CAPSULE(300 MG) BY MOUTH AT BEDTIME 90 capsule 0   gabapentin (NEURONTIN) 300 MG capsule Take 600 mg by mouth 2 (two) times daily.     pantoprazole (PROTONIX) 40 MG tablet Take  1 tablet (40 mg total) by mouth 2 (two) times daily. 90 tablet 3   rosuvastatin (CRESTOR) 20 MG tablet Take 1 tablet (20 mg total) by mouth daily. 90 tablet 1   telmisartan-hydrochlorothiazide (MICARDIS HCT) 40-12.5 MG tablet TAKE 1 TABLET BY MOUTH DAILY 30 tablet 0   tizanidine (ZANAFLEX) 2 MG capsule Take 4 mg by mouth at bedtime.     venlafaxine XR (EFFEXOR-XR) 37.5 MG 24 hr capsule TAKE 2 CAPSULES(75 MG) BY MOUTH AT BEDTIME 180 capsule 0   aspirin EC 81 MG tablet Take 1 tablet (81 mg total) by mouth daily. Swallow whole. (Patient taking differently: Take 81 mg by mouth at bedtime. Swallow whole.) 30 tablet 12   clonazePAM (KLONOPIN) 0.5 MG tablet Take 1 tablet (0.5 mg total) by mouth at bedtime. 30 tablet 5   clopidogrel (PLAVIX) 75 MG tablet Take 1 tablet (75 mg total) by mouth daily. 30 tablet 11   diclofenac Sodium (VOLTAREN) 1 % GEL APPLY 2 GRAMS TO THE AFFECTED AREA(S) BY TOPICAL ROUTE 2-3 TIMES PER DAY     hydrOXYzine (ATARAX) 10 MG tablet Take 1 tablet (10 mg total) by mouth every 8 (eight) hours as needed for anxiety. 270 tablet 0   meclizine (ANTIVERT) 25 MG tablet Take 25 mg by mouth daily as needed for dizziness.     traMADol (ULTRAM) 50 MG tablet Take 50 mg by mouth 2 (two) times daily.  Current Facility-Administered Medications  Medication Dose Route Frequency Provider Last Rate Last Admin   0.9 %  sodium chloride infusion  500 mL Intravenous Once Mansouraty, Netty Starring., MD        Current Outpatient Medications:    acetaminophen (TYLENOL) 325 MG tablet, Take 650 mg by mouth as needed., Disp: , Rfl:    acyclovir (ZOVIRAX) 400 MG tablet, TAKE 1 TABLET(400 MG) BY MOUTH DAILY, Disp: 90 tablet, Rfl: 1   baclofen (LIORESAL) 10 MG tablet, Take 1 tablet (10 mg total) by mouth 3 (three) times daily. (Patient taking differently: Take 10 mg by mouth as needed.), Disp: 90 each, Rfl: 5   carbamazepine (CARBATROL) 300 MG 12 hr capsule, TAKE 1 CAPSULE(300 MG) BY MOUTH AT BEDTIME, Disp: 90  capsule, Rfl: 0   gabapentin (NEURONTIN) 300 MG capsule, Take 600 mg by mouth 2 (two) times daily., Disp: , Rfl:    pantoprazole (PROTONIX) 40 MG tablet, Take 1 tablet (40 mg total) by mouth 2 (two) times daily., Disp: 90 tablet, Rfl: 3   rosuvastatin (CRESTOR) 20 MG tablet, Take 1 tablet (20 mg total) by mouth daily., Disp: 90 tablet, Rfl: 1   telmisartan-hydrochlorothiazide (MICARDIS HCT) 40-12.5 MG tablet, TAKE 1 TABLET BY MOUTH DAILY, Disp: 30 tablet, Rfl: 0   tizanidine (ZANAFLEX) 2 MG capsule, Take 4 mg by mouth at bedtime., Disp: , Rfl:    venlafaxine XR (EFFEXOR-XR) 37.5 MG 24 hr capsule, TAKE 2 CAPSULES(75 MG) BY MOUTH AT BEDTIME, Disp: 180 capsule, Rfl: 0   aspirin EC 81 MG tablet, Take 1 tablet (81 mg total) by mouth daily. Swallow whole. (Patient taking differently: Take 81 mg by mouth at bedtime. Swallow whole.), Disp: 30 tablet, Rfl: 12   clonazePAM (KLONOPIN) 0.5 MG tablet, Take 1 tablet (0.5 mg total) by mouth at bedtime., Disp: 30 tablet, Rfl: 5   clopidogrel (PLAVIX) 75 MG tablet, Take 1 tablet (75 mg total) by mouth daily., Disp: 30 tablet, Rfl: 11   diclofenac Sodium (VOLTAREN) 1 % GEL, APPLY 2 GRAMS TO THE AFFECTED AREA(S) BY TOPICAL ROUTE 2-3 TIMES PER DAY, Disp: , Rfl:    hydrOXYzine (ATARAX) 10 MG tablet, Take 1 tablet (10 mg total) by mouth every 8 (eight) hours as needed for anxiety., Disp: 270 tablet, Rfl: 0   meclizine (ANTIVERT) 25 MG tablet, Take 25 mg by mouth daily as needed for dizziness., Disp: , Rfl:    traMADol (ULTRAM) 50 MG tablet, Take 50 mg by mouth 2 (two) times daily., Disp: , Rfl:   Current Facility-Administered Medications:    0.9 %  sodium chloride infusion, 500 mL, Intravenous, Once, Mansouraty, Netty Starring., MD Allergies  Allergen Reactions   Plaquenil [Hydroxychloroquine]     Retinal damage   Sulfa Antibiotics Nausea And Vomiting   Penicillins Rash   Family History  Problem Relation Age of Onset   Heart block Mother        Pacemaker    Hypertension Mother    Depression Mother    Gout Mother    Macular degeneration Mother    Aneurysm Mother    Alcoholism Father    Congestive Heart Failure Maternal Grandmother    Diabetes Maternal Grandmother    Congestive Heart Failure Maternal Grandfather    Colon cancer Maternal Grandfather    Stroke Paternal Grandfather    Hypertension Paternal Grandfather    Esophageal cancer Neg Hx    Stomach cancer Neg Hx    Rectal cancer Neg Hx    Social History  Socioeconomic History   Marital status: Widowed    Spouse name: Not on file   Number of children: 0   Years of education: Not on file   Highest education level: Bachelor's degree (e.g., BA, AB, BS)  Occupational History    Comment: RN ortho/neuro at [employer]  Tobacco Use   Smoking status: Never    Passive exposure: Never   Smokeless tobacco: Never  Vaping Use   Vaping status: Never Used  Substance and Sexual Activity   Alcohol use: Yes    Comment: social   Drug use: Never   Sexual activity: Not Currently    Birth control/protection: Post-menopausal  Other Topics Concern   Not on file  Social History Narrative   06/13/22 living with her mother   Social Determinants of Health   Financial Resource Strain: Low Risk  (05/22/2023)   Overall Financial Resource Strain (CARDIA)    Difficulty of Paying Living Expenses: Not hard at all  Food Insecurity: No Food Insecurity (05/22/2023)   Hunger Vital Sign    Worried About Running Out of Food in the Last Year: Never true    Ran Out of Food in the Last Year: Never true  Transportation Needs: No Transportation Needs (05/22/2023)   PRAPARE - Administrator, Civil Service (Medical): No    Lack of Transportation (Non-Medical): No  Physical Activity: Sufficiently Active (05/22/2023)   Exercise Vital Sign    Days of Exercise per Week: 4 days    Minutes of Exercise per Session: 40 min  Stress: Stress Concern Present (05/22/2023)   [employer] of Occupational Health -  Occupational Stress Questionnaire    Feeling of Stress : Very much  Social Connections: Moderately Isolated (05/22/2023)   Social Connection and Isolation Panel [NHANES]    Frequency of Communication with Friends and Family: Three times a week    Frequency of Social Gatherings with Friends and Family: More than three times a week    Attends Religious Services: Never    Database administrator or Organizations: No    Attends Engineer, structural: More than 4 times per year    Marital Status: Widowed  Intimate Partner Violence: Not At Risk (10/24/2022)   Humiliation, Afraid, Rape, and Kick questionnaire    Fear of Current or Ex-Partner: No    Emotionally Abused: No    Physically Abused: No    Sexually Abused: No    Physical Exam: Today's Vitals   07/09/23 1433  BP: (!) 159/74  Pulse: (!) 54  Temp: (!) 97.1 F (36.2 C)  SpO2: 100%  Weight: 142 lb (64.4 kg)  Height: 5\' 2"  (1.575 m)   Body mass index is 25.97 kg/m. GEN: NAD EYE: Sclerae anicteric ENT: MMM CV: Non-tachycardic GI: Soft, NT/ND NEURO:  Alert & Oriented x 3  Lab Results: No results for input(s): "WBC", "HGB", "HCT", "PLT" in the last 72 hours. BMET No results for input(s): "NA", "K", "CL", "CO2", "GLUCOSE", "BUN", "CREATININE", "CALCIUM" in the last 72 hours. LFT No results for input(s): "PROT", "ALBUMIN", "AST", "ALT", "ALKPHOS", "BILITOT", "BILIDIR", "IBILI" in the last 72 hours. PT/INR No results for input(s): "LABPROT", "INR" in the last 72 hours.   Impression / Plan: This is a 67 y.o.female who presents for EGD/Colonoscopy to evaluate GERD not controlled on current therapy and for alteration of bowel habits and fecal incontinence and history of previous colon polyps.  The risks and benefits of endoscopic evaluation/treatment were discussed with the patient and/or family;  these include but are not limited to the risk of perforation, infection, bleeding, missed lesions, lack of diagnosis, severe  illness requiring hospitalization, as well as anesthesia and sedation related illnesses.  The patient's history has been reviewed, patient examined, no change in status, and deemed stable for procedure.  The patient and/or family is agreeable to proceed.    Corliss Parish, MD [facility] Office # [phone]

## 2023-07-09 NOTE — Op Note (Signed)
[facility] Patient Name: Debbie Hatfield Procedure Date: 07/09/2023 2:53 PM MRN: [id number] Endoscopist: Corliss Parish , MD, [id number] Age: 67 Referring MD:  Date of Birth: Jul 02, 1956 Gender: Female Account #: [id number] Procedure:                Colonoscopy Indications:              High risk colon cancer surveillance: Personal                            history of colonic polyps, Incidental change in                            bowel habits noted Medicines:                Monitored Anesthesia Care Procedure:                Pre-Anesthesia Assessment:                           - Prior to the procedure, a History and Physical                            was performed, and patient medications and                            allergies were reviewed. The patient's tolerance of                            previous anesthesia was also reviewed. The risks                            and benefits of the procedure and the sedation                            options and risks were discussed with the patient.                            All questions were answered, and informed consent                            was obtained. Prior Anticoagulants: The patient has                            taken no anticoagulant or antiplatelet agents                            except for aspirin. ASA Grade Assessment: II - A                            patient with mild systemic disease. After reviewing                            the risks and benefits, the patient was deemed in  satisfactory condition to undergo the procedure.                           After obtaining informed consent, the colonoscope                            was passed under direct vision. Throughout the                            procedure, the patient's blood pressure, pulse, and                            oxygen saturations were monitored continuously. The                            Olympus Scope  [id number] was introduced through the                            anus and advanced to the 3 cm into the ileum. The                            colonoscopy was performed without difficulty. The                            patient tolerated the procedure. The quality of the                            bowel preparation was good. The terminal ileum,                            ileocecal valve, appendiceal orifice, and rectum                            were photographed. Scope In: 3:19:13 PM Scope Out: 3:33:21 PM Scope Withdrawal Time: 0 hours 9 minutes 57 seconds  Total Procedure Duration: 0 hours 14 minutes 8 seconds  Findings:                 The digital rectal exam findings include                            hemorrhoids. Pertinent negatives include no                            palpable rectal lesions.                           The terminal ileum and ileocecal valve appeared                            normal.                           A 7 mm polyp was found in the recto-sigmoid colon.  The polyp was sessile. The polyp was removed with a                            cold snare. Resection and retrieval were complete.                           Multiple small-mouthed diverticula were found in                            the recto-sigmoid colon and sigmoid colon.                           Normal mucosa was found in the entire colon.                            Biopsies for histology were taken with a cold                            forceps from the entire colon for evaluation of                            microscopic colitis.                           Non-bleeding non-thrombosed external and internal                            hemorrhoids were found during retroflexion, during                            perianal exam and during digital exam. The                            hemorrhoids were Grade II (internal hemorrhoids                            that prolapse but reduce  spontaneously). Complications:            No immediate complications. Estimated Blood Loss:     Estimated blood loss was minimal. Impression:               - Hemorrhoids found on digital rectal exam.                           - The examined portion of the ileum was normal.                           - One 7 mm polyp at the recto-sigmoid colon,                            removed with a cold snare. Resected and retrieved.                           - Diverticulosis in the recto-sigmoid colon and in  the sigmoid colon.                           - Normal mucosa in the entire examined colon.                            Biopsied.                           - Non-bleeding non-thrombosed external and internal                            hemorrhoids. Recommendation:           - The patient will be observed post-procedure,                            until all discharge criteria are met.                           - Discharge patient to home.                           - Patient has a contact number available for                            emergencies. The signs and symptoms of potential                            delayed complications were discussed with the                            patient. Return to normal activities tomorrow.                            Written discharge instructions were provided to the                            patient.                           - High fiber diet.                           - Use FiberCon 1-2 tablets PO daily.                           - Restart Plavix in 48-hours (9/27) to decrease                            risk of post-interventional bleeding risk.                           - Continue present medications.                           - Await pathology results.                           -  Repeat colonoscopy in 5-7 years for surveillance                            based on pathology results and previous history of                             colon polyps.                           - The findings and recommendations were discussed                            with the patient.                           - The findings and recommendations were discussed                            with the patient's family. Corliss Parish, MD 07/09/2023 3:46:57 PM

## 2023-07-09 NOTE — Patient Instructions (Addendum)
Resume previous diet,  High fiber diet is recommended FiberCon tablets 1-2 orally daily Continue present medications, except RESUME PLAVIX ON THURSDAY AFTERNOON, 07/11/23 Await pathology results Repeat EGD if needed for re-treatment if no improvement in swallowing Handouts/information given for Hiatal Hernia, esophageal dilation, Schatzki Ring, gastric polyps, colon polyps, diverticulosis and hemorrhoids  YOU HAD AN ENDOSCOPIC PROCEDURE TODAY AT THE [facility]:   Refer to the procedure report that was given to you for any specific questions about what was found during the examination.  If the procedure report does not answer your questions, please call your gastroenterologist to clarify.  If you requested that your care partner not be given the details of your procedure findings, then the procedure report has been included in a sealed envelope for you to review at your convenience later.  YOU SHOULD EXPECT: Some feelings of bloating in the abdomen. Passage of more gas than usual.  Walking can help get rid of the air that was put into your GI tract during the procedure and reduce the bloating. If you had a lower endoscopy (such as a colonoscopy or flexible sigmoidoscopy) you may notice spotting of blood in your stool or on the toilet paper. If you underwent a bowel prep for your procedure, you may not have a normal bowel movement for a few days.  Please Note:  You might notice some irritation and congestion in your nose or some drainage.  This is from the oxygen used during your procedure.  There is no need for concern and it should clear up in a day or so.  SYMPTOMS TO REPORT IMMEDIATELY:  Following lower endoscopy (colonoscopy):  Excessive amounts of blood in the stool  Significant tenderness or worsening of abdominal pains  Swelling of the abdomen that is new, acute  Fever of 100F or higher Following upper endoscopy (EGD)  Vomiting of blood or coffee ground material  New chest  pain or pain under the shoulder blades  Painful or persistently difficult swallowing  New shortness of breath  Black, tarry-looking stools  For urgent or emergent issues, a gastroenterologist can be reached at any hour by calling [phone]. Do not use MyChart messaging for urgent concerns.   DIET:  We do recommend a small meal at first, but then you may proceed to your regular diet.  Drink plenty of fluids but you should avoid alcoholic beverages for 24 hours.  ACTIVITY:  You should plan to take it easy for the rest of today and you should NOT DRIVE or use heavy machinery until tomorrow (because of the sedation medicines used during the test).    FOLLOW UP: Our staff will call the number listed on your records the next business day following your procedure.  We will call around 7:15- 8:00 am to check on you and address any questions or concerns that you may have regarding the information given to you following your procedure. If we do not reach you, we will leave a message.     If any biopsies were taken you will be contacted by phone or by letter within the next 1-3 weeks.  Please call us at [phone] if you have not heard about the biopsies in 3 weeks.    SIGNATURES/CONFIDENTIALITY: You and/or your care partner have signed paperwork which will be entered into your electronic medical record.  These signatures attest to the fact that that the information above on your After Visit Summary has been reviewed and is understood.  Full responsibility  of the confidentiality of this discharge information lies with you and/or your care-partner.

## 2023-07-10 ENCOUNTER — Other Ambulatory Visit: Payer: Self-pay

## 2023-07-10 ENCOUNTER — Encounter: Payer: Self-pay | Admitting: Gastroenterology

## 2023-07-10 ENCOUNTER — Other Ambulatory Visit (INDEPENDENT_AMBULATORY_CARE_PROVIDER_SITE_OTHER): Payer: Medicare Other

## 2023-07-10 ENCOUNTER — Ambulatory Visit (INDEPENDENT_AMBULATORY_CARE_PROVIDER_SITE_OTHER)
Admission: RE | Admit: 2023-07-10 | Discharge: 2023-07-10 | Disposition: A | Discharge status: Home / Self Care | Payer: Medicare Other | Source: Ambulatory Visit | Attending: Gastroenterology | Admitting: Gastroenterology

## 2023-07-10 ENCOUNTER — Telehealth: Payer: Self-pay

## 2023-07-10 DIAGNOSIS — R079 Chest pain, unspecified: Secondary | ICD-10-CM

## 2023-07-10 DIAGNOSIS — K625 Hemorrhage of anus and rectum: Secondary | ICD-10-CM

## 2023-07-10 LAB — CBC WITH DIFFERENTIAL/PLATELET
Basophils Absolute: 0 10*3/uL (ref 0.0–0.1)
Basophils Relative: 0.2 % (ref 0.0–3.0)
Eosinophils Absolute: 0 10*3/uL (ref 0.0–0.7)
Eosinophils Relative: 0.6 % (ref 0.0–5.0)
HCT: 37.8 % (ref 36.0–46.0)
Hemoglobin: 12.2 g/dL (ref 12.0–15.0)
Lymphocytes Relative: 29.5 % (ref 12.0–46.0)
Lymphs Abs: 2.2 10*3/uL (ref 0.7–4.0)
MCHC: 32.3 g/dL (ref 30.0–36.0)
MCV: 92.7 fl (ref 78.0–100.0)
Monocytes Absolute: 0.7 10*3/uL (ref 0.1–1.0)
Monocytes Relative: 9.1 % (ref 3.0–12.0)
Neutro Abs: 4.5 10*3/uL (ref 1.4–7.7)
Neutrophils Relative %: 60.6 % (ref 43.0–77.0)
Platelets: 339 10*3/uL (ref 150.0–400.0)
RBC: 4.08 Mil/uL (ref 3.87–5.11)
RDW: 13.9 % (ref 11.5–15.5)
WBC: 7.4 10*3/uL (ref 4.0–10.5)

## 2023-07-10 LAB — PROTIME-INR
INR: 1.1 ratio — ABNORMAL HIGH (ref 0.8–1.0)
Prothrombin Time: 11.2 s (ref 9.6–13.1)

## 2023-07-10 LAB — BASIC METABOLIC PANEL
BUN: 13 mg/dL (ref 6–23)
CO2: 28 mEq/L (ref 19–32)
Calcium: 9.6 mg/dL (ref 8.4–10.5)
Chloride: 102 mEq/L (ref 96–112)
Creatinine, Ser: 1.04 mg/dL (ref 0.40–1.20)
GFR: 55.92 mL/min — ABNORMAL LOW (ref >60.00)
Glucose, Bld: 115 mg/dL — ABNORMAL HIGH (ref 70–99)
Potassium: 3.9 mEq/L (ref 3.5–5.1)
Sodium: 139 mEq/L (ref 135–145)

## 2023-07-10 MED ORDER — LIDOCAINE VISCOUS HCL 2 % MT SOLN: 30.0000 mL | Freq: Three times a day (TID) | OROMUCOSAL | 0 refills | Status: DC | PRN

## 2023-07-10 NOTE — Telephone Encounter (Signed)
Inbound call from patient, following up on call below.

## 2023-07-10 NOTE — Telephone Encounter (Signed)
I am sorry to hear this. This seems a bit out of proportion to what we did in regards to her EGD/Colonoscopy and with only a small polyp having been removed from the colon. She could come in for a CBC/CMP/INR and CXR (2-view), but if she feels that she is still having significant oozing/bleeding from her rectum and persisting chest pain/pressure after the procedures, she will need to come into the ED/hospital for further evaluation. Keep me UTD with what she decides. GM

## 2023-07-10 NOTE — Telephone Encounter (Signed)
Returned pt's call. States that starting at about 2am she has been passing dark red blood per rectum. States she believes it has been about 1/2-3/4 cup in amount. And it is still oozing out now. Reports all over abdominal cramping that is 4/10. Also reports right chest pressure after EGD, could not give it a number but states it's a nuisance. She has eaten only a piece of cheese toast yesterday evening and has drank  about 48oz of water. Reports that all of these symptoms are all new. Denies fever or SOB. MD please advise. Dr. Lavon Paganini I added you on as DOD in case Dr. Meridee Score does not see as he is at [facility] endo this morning.

## 2023-07-10 NOTE — Telephone Encounter (Signed)
Thanks everyone for this. Patty, please make sure the labs are STAT and the CXR is STAT.  GM

## 2023-07-10 NOTE — Telephone Encounter (Signed)
Orders for labs and xray has been entered  The pt aware see My Chart message for further communication.

## 2023-07-10 NOTE — Telephone Encounter (Signed)
Called patient back, complains of chest pain and pressure, worse with eating or drinking.  She is also having episodes of rectal bleeding, patient states it smells like old blood, is retired Engineer, civil (consulting) S/p EGD with dilation with [facility] yesterday. Denies any dizziness or abdominal pain.  Advised patient to come to office for stat CBC, BMP, chest x-ray two-view and abdominal x-ray 1 view. If she is having worsening pain or bleeding, advised her to come to ER for further evaluation

## 2023-07-10 NOTE — Telephone Encounter (Signed)
Also confirm, that she did not restart her Plavix, she was supposed to hold that for 2-days.

## 2023-07-10 NOTE — Telephone Encounter (Signed)
No answer, left message to call if having any issues or concerns, B.Lj Miyamoto RN 

## 2023-07-10 NOTE — Telephone Encounter (Signed)
Inbound call from patient, urgently requesting to speak to someone, states she has been bleeding rectally since her procedure.

## 2023-07-10 NOTE — Telephone Encounter (Signed)
Thanks. GM

## 2023-07-10 NOTE — Telephone Encounter (Signed)
Orders have been entered as STAT

## 2023-07-12 LAB — SURGICAL PATHOLOGY

## 2023-07-13 ENCOUNTER — Encounter: Payer: Self-pay | Admitting: Gastroenterology

## 2023-07-16 ENCOUNTER — Encounter: Payer: Self-pay | Admitting: Gastroenterology

## 2023-07-17 ENCOUNTER — Telehealth: Payer: Self-pay | Admitting: Cardiology

## 2023-07-17 ENCOUNTER — Encounter: Payer: Self-pay | Admitting: Cardiology

## 2023-07-17 LAB — CUP PACEART REMOTE DEVICE CHECK
Date Time Interrogation Session: 20240930231749
Implantable Pulse Generator Implant Date: 20220728

## 2023-07-17 NOTE — Telephone Encounter (Signed)
Please followup on the chart tomorrow and let me know what ends up happening for her after ED evaluation. Thanks. GM

## 2023-07-17 NOTE — Telephone Encounter (Signed)
Dr Rhetta Mura I spoke with the pt and she tells me that she has a call out to her cardiologist but she is planning on going to the ED for eval today.  She is not feeling any better and may be some worse.  FYI

## 2023-07-17 NOTE — Telephone Encounter (Signed)
Called patient and she reported that she has been having episodes of chest pain off and on for the past month. She had an chest pain episode yesterday that started on the right side of her sternum and radiated to the back of her neck, she also became nauseated and diaphoretic. Last week she had an "upper GI procedure" and she called the physician that performed the test regarding the chest pain and he called her in a "GI cocktail". This did not help with the pain and after that she developed chest pressure and became weak. Based on her symptoms I recommended that she go to the ER to be evaluated. Patient agreed and stated she would go to the ER. Patient had no further questions at this time.

## 2023-07-17 NOTE — Telephone Encounter (Signed)
Debbie Hatfield, I would let the patient know that is out of proportion to what would be expected this far out. She needs a STAT CT-Chest/Abdomen/Pelvis to further evaluate her symptoms. It is reasonable, to also set up a Cariology appointment, but imaging needs to be performed. Can give her a few more refills of GI Cocktail.

## 2023-07-17 NOTE — Telephone Encounter (Signed)
Pt c/o of Chest Pain: STAT if CP now or developed within 24 hours  1. Are you having CP right now?  No CP at all today, but severe right sided CP yesterday that radiated into her neck  2. Are you experiencing any other symptoms (ex. SOB, nausea, vomiting, sweating)?  Nausea + sweating yesterday   3. How long have you been experiencing CP?  About 1 month , but yesterday was the worst and patient states she will not go to the ED  4. Is your CP continuous or coming and going?  Coming and going   5. Have you taken Nitroglycerin?   Patient states she does not have any nitro

## 2023-07-18 ENCOUNTER — Encounter: Payer: Self-pay | Admitting: Cardiology

## 2023-07-18 ENCOUNTER — Ambulatory Visit: Payer: Medicare Other | Attending: Cardiology | Admitting: Cardiology

## 2023-07-18 VITALS — BP 132/82 | HR 55 | Ht 62.0 in | Wt 143.0 lb

## 2023-07-18 DIAGNOSIS — R072 Precordial pain: Secondary | ICD-10-CM | POA: Diagnosis present

## 2023-07-18 DIAGNOSIS — R079 Chest pain, unspecified: Secondary | ICD-10-CM

## 2023-07-18 MED ORDER — SUCRALFATE 1 GM/10ML PO SUSP: 1.0000 g | Freq: Three times a day (TID) | ORAL | 0 refills | Status: DC

## 2023-07-18 MED ORDER — METOPROLOL TARTRATE 25 MG PO TABS: 25.0000 mg | ORAL_TABLET | Freq: Once | ORAL | 0 refills | Status: DC

## 2023-07-18 NOTE — Patient Instructions (Signed)
Medication Instructions:  Your physician has recommended you make the following change in your medication:   STOP: Aspirin START:Carafate 1gm / 10ml three times daily for 15 days  *If you need a refill on your cardiac medications before your next appointment, please call your pharmacy*   Lab Work: Your physician recommends that you return for lab work in:   Labs today: Cardiolipin Antibodies (IGG, Igm, Iga), Beta 2 Glycoprotien Antibodies  If you have labs (blood work) drawn today and your tests are completely normal, you will receive your results only by: MyChart Message (if you have MyChart) OR A paper copy in the mail If you have any lab test that is abnormal or we need to change your treatment, we will call you to review the results.   Testing/Procedures:   Your cardiac CT will be scheduled at one of the below locations:   [facility] [address] [place], [place] [address] [phone]  OR  [facility] [address] [place], [place] [address] [phone]  OR   [facility] [address] [place], [place] [address] [phone]  If scheduled at [facility], please arrive at the [facility] Entrance (Entrance C2) of [facility] 30 minutes prior to test start time. You can use the FREE valet parking offered at entrance C (encouraged to control the heart rate for the test)  Proceed to the [facility] Radiology Department (first floor) to check-in and test prep.  All radiology patients and guests should use entrance C2 at [facility], accessed from [facility], even though the hospital's physical address listed is [address].    If scheduled at [facility] or [facility], please arrive 15 mins early for check-in and test prep.  There is spacious parking and easy access to  the radiology department from the [facility] entrance. Please enter here and check-in with the desk attendant.   Please follow these instructions carefully (unless otherwise directed):  An IV will be required for this test and Nitroglycerin will be given.  Hold all erectile dysfunction medications at least 3 days (72 hrs) prior to test. (Ie viagra, cialis, sildenafil, tadalafil, etc)   On the Night Before the Test: Be sure to Drink plenty of water. Do not consume any caffeinated/decaffeinated beverages or chocolate 12 hours prior to your test. Do not take any antihistamines 12 hours prior to your test.  On the Day of the Test: Drink plenty of water until 1 hour prior to the test. Do not eat any food 1 hour prior to test. You may take your regular medications prior to the test.  Take metoprolol (Lopressor) two hours prior to test. FEMALES- please wear underwire-free bra if available, avoid dresses & tight clothing      After the Test: Drink plenty of water. After receiving IV contrast, you may experience a mild flushed feeling. This is normal. On occasion, you may experience a mild rash up to 24 hours after the test. This is not dangerous. If this occurs, you can take Benadryl 25 mg and increase your fluid intake. If you experience trouble breathing, this can be serious. If it is severe call 911 IMMEDIATELY. If it is mild, please call our office.  We will call to schedule your test 2-4 weeks out understanding that some insurance companies will need an authorization prior to the service being performed.  For more information and frequently asked questions, please visit our website : [email or web address]  For non-scheduling related questions, please contact the cardiac imaging nurse navigator should you have any questions/concerns: Cardiac Imaging Nurse Navigators Direct Office Dial: [phone]   For scheduling needs, including cancellations and  rescheduling, please call [place], [phone].    Follow-Up: At [facility], you and your health needs are our priority.  As part of our continuing mission to provide you with exceptional heart care, we have created designated Provider Care Teams.  These Care Teams include your primary Cardiologist (physician) and Advanced Practice Providers (APPs -  Physician Assistants and Nurse Practitioners) who all work together to provide you with the care you need, when you need it.  We recommend signing up for the patient portal called "MyChart".  Sign up information is provided on this After Visit Summary.  MyChart is used to connect with patients for Virtual Visits (Telemedicine).  Patients are able to view lab/test results, encounter notes, upcoming appointments, etc.  Non-urgent messages can be sent to your provider as well.   To learn more about what you can do with MyChart, go to [email or web address].    Your next appointment:   6 week(s)  Provider:   Norman Herrlich, MD    Other Instructions None

## 2023-07-18 NOTE — Progress Notes (Signed)
Cardiology Office Note:    Date:  07/18/2023   ID:  Debbie Hatfield, DOB 05/01/1956, MRN [id number]  PCP:  Debbie Grandchild, MD  Cardiologist:  Debbie Herrlich, MD    Referring MD: Debbie Grandchild, MD    ASSESSMENT:    1. Chest pain of uncertain etiology   2. Precordial pain    PLAN:    In order of problems listed above:  Symptoms are nonanginal EKG is stable for further evaluation she will undergo cardiac CTA To alleviate esophageal symptoms I gave her a 2-week prescription of Carafate to use along with her PPI Hold aspirin for the next few weeks continue clopidogrel I will order the antiphospholipid test to be repeated if they remain abnormal she will need to consider warfarin therapy   Next appointment: She will be seen sooner in the office in 6 weeks   Medication Adjustments/Labs and Tests Ordered: Current medicines are reviewed at length with the patient today.  Concerns regarding medicines are outlined above.  Orders Placed This Encounter  Procedures   CT CORONARY MORPH W/CTA COR W/SCORE W/CA W/CM &/OR WO/CM   Cardiolipin antibodies, IgG, IgM, IgA   Beta-2 glycoprotein antibodies   Basic Metabolic Panel (BMET)   Beta-2-glycoprotein i abs, IgG/M/A   EKG 12-Lead   Meds ordered this encounter  Medications   sucralfate (CARAFATE) 1 GM/10ML suspension    Sig: Take 10 mLs (1 g total) by mouth in the morning, at noon, and at bedtime.    Dispense:  450 mL    Refill:  0   metoprolol tartrate (LOPRESSOR) 25 MG tablet    Sig: Take 1 tablet (25 mg total) by mouth once for 1 dose. Please take this medication 2 hours before CT.    Dispense:  1 tablet    Refill:  0     History of Present Illness:    Debbie Hatfield is a 67 y.o. 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  She found her office yesterday with a new complaint of chest pain.  She was recently seen by GI for evaluation of GERD which was not felt to be controlled with current therapy and advised endoscopy.  There have been multiple phone calls and on 1 GI recommended a stat CT of chest abdomen and pelvis and said it would be reasonable to also see cardiology.  She was seen by rheumatology 06/06/2023 for lupus.  And seen by hematology for antiphospholipid syndrome.  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 She is having nonanginal and nonexertional chest pain the last episode 2 days ago She is no known coronary artery disease and her EKG pattern is unchanged QS in V2 seen more often in normals due to lead placement than in the setting of previous septal infarction Has never had an ischemia evaluation will be set up for cardiac CTA To alleviate esophageal symptoms will hold aspirin and have her initiate Carafate. At her request I will order the labs for antiphospholipid antibody they were never repeated She is not having exertional chest pain chest pain that occurred radiate up into the right face and actually right eye from the chest. Past Medical History:  Diagnosis Date   Closed fracture of right distal radius    Gastroparesis    GERD (gastroesophageal reflux disease)    Hypertension    Irritable bowel disease    Lupus    Major depression    Stroke (HCC)    Trigeminal neuralgia     Current Medications: Current Meds  Medication Sig   acetaminophen (TYLENOL) 325 MG tablet Take 650 mg by mouth as needed.   acyclovir (ZOVIRAX) 400 MG tablet TAKE 1 TABLET(400 MG) BY MOUTH DAILY   baclofen (LIORESAL) 10 MG tablet Take 1 tablet (10 mg total) by mouth 3 (three) times daily.   carbamazepine (CARBATROL) 300 MG 12 hr capsule TAKE 1 CAPSULE(300 MG) BY MOUTH AT BEDTIME   clopidogrel  (PLAVIX) 75 MG tablet Take 1 tablet (75 mg total) by mouth daily.   diclofenac Sodium (VOLTAREN) 1 % GEL APPLY 2 GRAMS TO THE AFFECTED AREA(S) BY TOPICAL ROUTE 2-3 TIMES PER DAY   gabapentin (NEURONTIN) 300 MG capsule Take 600 mg by mouth 2 (two) times daily.   GI Cocktail (alum & mag hydroxide, lidocaine, dicyclomine) oral mixture Take 30 mLs by mouth 3 (three) times daily as needed. Swish and swallow 30 ml 3 times daily prn   hydrOXYzine (ATARAX) 10 MG tablet Take 1 tablet (10 mg total) by mouth every 8 (eight) hours as needed for anxiety.   meclizine (ANTIVERT) 25 MG tablet Take 25 mg by mouth daily as needed for dizziness.   metoprolol tartrate (LOPRESSOR) 25 MG tablet Take 1 tablet (25 mg total) by mouth once for 1 dose. Please take this medication 2 hours before CT.   pantoprazole (PROTONIX) 40 MG tablet Take 1 tablet (40 mg total) by mouth 2 (two) times daily.   rosuvastatin (CRESTOR) 20 MG tablet Take 1 tablet (20 mg total) by mouth daily.   sucralfate (CARAFATE) 1 GM/10ML suspension Take 10 mLs (1 g total) by mouth in the morning, at noon, and at bedtime.   telmisartan-hydrochlorothiazide (MICARDIS HCT) 40-12.5 MG tablet TAKE 1 TABLET BY MOUTH DAILY   tizanidine (ZANAFLEX) 2 MG capsule Take 4 mg by mouth at bedtime.   venlafaxine XR (EFFEXOR-XR) 37.5 MG 24 hr capsule TAKE 2 CAPSULES(75 MG) BY MOUTH AT BEDTIME   [DISCONTINUED] aspirin EC 81 MG tablet Take 1 tablet (81 mg total) by mouth daily. Swallow whole.      EKGs/Labs/Other Studies Reviewed:    The following studies were reviewed today:  Cardiac Studies & Procedures     STRESS TESTS  MYOCARDIAL PERFUSION IMAGING 06/12/2022  Narrative   The study is normal. The study is low risk.   No ST deviation was noted.   Left ventricular function is normal. Nuclear stress EF: 72 %. The left ventricular ejection fraction is hyperdynamic (>65%). End diastolic cavity size is normal.   Prior study not available for comparison.    ECHOCARDIOGRAM  ECHOCARDIOGRAM COMPLETE 05/07/2022  Narrative ECHOCARDIOGRAM REPORT    Patient Name:   Debbie Hatfield Date of Exam: 05/07/2022 Medical Rec #:  [id number]      Height:       62.0 in Accession #:    [id number]     Weight:       145.1 lb Date of Birth:  January 15, 1956       BSA:          1.668 m Patient Age:    66 years       BP:  0.80 cm     LV e' lateral:   7.98  cm/s LVOT diam:     1.90 cm     LV E/e' lateral: 11.0 LV SV:         72 LV SV Index:   43 LVOT Area:     2.84 cm  LV Volumes (MOD) LV vol d, MOD A2C: 83.2 ml LV vol d, MOD A4C: 83.7 ml LV vol s, MOD A2C: 30.9 ml LV vol s, MOD A4C: 33.3 ml LV SV MOD A2C:     52.3 ml LV SV MOD A4C:     83.7 ml LV SV MOD BP:      50.3 ml  RIGHT VENTRICLE RV S prime:     11.90 cm/s TAPSE (M-mode): 2.0 cm  LEFT ATRIUM             Index        RIGHT ATRIUM           Index LA diam:        3.80 cm 2.28 cm/m   RA Area:     10.10 cm LA Vol (A2C):   56.5 ml 33.88 ml/m  RA Volume:   18.40 ml  11.03 ml/m LA Vol (A4C):   39.3 ml 23.56 ml/m LA Biplane Vol: 49.4 ml 29.62 ml/m AORTIC VALVE LVOT Vmax:   96.30 cm/s LVOT Vmean:  71.700 cm/s LVOT VTI:    0.254 m  AORTA Ao Root diam: 3.00 cm Ao Asc diam:  3.00 cm  MITRAL VALVE               TRICUSPID VALVE MV Area (PHT): 3.31 cm    TR Peak grad:   19.4 mmHg MV Decel Time: 229 msec    TR Vmax:        220.00 cm/s MV E velocity: 88.10 cm/s MV A velocity: 96.70 cm/s  SHUNTS MV E/A ratio:  0.91        Systemic VTI:  0.25 m Systemic Diam: 1.90 cm  Charlton Haws MD Electronically signed by Charlton Haws MD Signature Date/Time: 05/07/2022/11:50:43 AM    Final   TEE  ECHO TEE 07/05/2022  Narrative TRANSESOPHOGEAL ECHO REPORT    Patient Name:   MARKELL SCHRIER Date of Exam: 07/05/2022 Medical Rec #:  [id number]      Height:       62.0 in Accession #:    [id number]     Weight:       145.1 lb Date of Birth:  1955-12-18       BSA:          1.668 m Patient Age:    66 years       BP:           164/72 mmHg Patient Gender: F              HR:           90 bpm. Exam Location:  Inpatient  Procedure: Transesophageal Echo, Color Doppler, Cardiac Doppler and Saline Contrast Bubble Study  Indications:     Stroke i63.9  History:         Patient has prior history of Echocardiogram examinations, most recent 05/07/2022. Risk Factors:Hypertension and  Dyslipidemia.  Sonographer:     Irving Burton Senior RDCS Referring Phys:  Debbie Hatfield, J Diagnosing Phys: Weston Brass MD  PROCEDURE: After discussion of the risks and benefits of a TEE, an informed consent was obtained from the patient. TEE procedure time  Cardiology Office Note:    Date:  07/18/2023   ID:  Debbie Hatfield, DOB 05/01/1956, MRN [id number]  PCP:  Debbie Grandchild, MD  Cardiologist:  Debbie Herrlich, MD    Referring MD: Debbie Grandchild, MD    ASSESSMENT:    1. Chest pain of uncertain etiology   2. Precordial pain    PLAN:    In order of problems listed above:  Symptoms are nonanginal EKG is stable for further evaluation she will undergo cardiac CTA To alleviate esophageal symptoms I gave her a 2-week prescription of Carafate to use along with her PPI Hold aspirin for the next few weeks continue clopidogrel I will order the antiphospholipid test to be repeated if they remain abnormal she will need to consider warfarin therapy   Next appointment: She will be seen sooner in the office in 6 weeks   Medication Adjustments/Labs and Tests Ordered: Current medicines are reviewed at length with the patient today.  Concerns regarding medicines are outlined above.  Orders Placed This Encounter  Procedures   CT CORONARY MORPH W/CTA COR W/SCORE W/CA W/CM &/OR WO/CM   Cardiolipin antibodies, IgG, IgM, IgA   Beta-2 glycoprotein antibodies   Basic Metabolic Panel (BMET)   Beta-2-glycoprotein i abs, IgG/M/A   EKG 12-Lead   Meds ordered this encounter  Medications   sucralfate (CARAFATE) 1 GM/10ML suspension    Sig: Take 10 mLs (1 g total) by mouth in the morning, at noon, and at bedtime.    Dispense:  450 mL    Refill:  0   metoprolol tartrate (LOPRESSOR) 25 MG tablet    Sig: Take 1 tablet (25 mg total) by mouth once for 1 dose. Please take this medication 2 hours before CT.    Dispense:  1 tablet    Refill:  0     History of Present Illness:    Debbie Hatfield is a 67 y.o. 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  She found her office yesterday with a new complaint of chest pain.  She was recently seen by GI for evaluation of GERD which was not felt to be controlled with current therapy and advised endoscopy.  There have been multiple phone calls and on 1 GI recommended a stat CT of chest abdomen and pelvis and said it would be reasonable to also see cardiology.  She was seen by rheumatology 06/06/2023 for lupus.  And seen by hematology for antiphospholipid syndrome.  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 She is having nonanginal and nonexertional chest pain the last episode 2 days ago She is no known coronary artery disease and her EKG pattern is unchanged QS in V2 seen more often in normals due to lead placement than in the setting of previous septal infarction Has never had an ischemia evaluation will be set up for cardiac CTA To alleviate esophageal symptoms will hold aspirin and have her initiate Carafate. At her request I will order the labs for antiphospholipid antibody they were never repeated She is not having exertional chest pain chest pain that occurred radiate up into the right face and actually right eye from the chest. Past Medical History:  0.80 cm     LV e' lateral:   7.98  cm/s LVOT diam:     1.90 cm     LV E/e' lateral: 11.0 LV SV:         72 LV SV Index:   43 LVOT Area:     2.84 cm  LV Volumes (MOD) LV vol d, MOD A2C: 83.2 ml LV vol d, MOD A4C: 83.7 ml LV vol s, MOD A2C: 30.9 ml LV vol s, MOD A4C: 33.3 ml LV SV MOD A2C:     52.3 ml LV SV MOD A4C:     83.7 ml LV SV MOD BP:      50.3 ml  RIGHT VENTRICLE RV S prime:     11.90 cm/s TAPSE (M-mode): 2.0 cm  LEFT ATRIUM             Index        RIGHT ATRIUM           Index LA diam:        3.80 cm 2.28 cm/m   RA Area:     10.10 cm LA Vol (A2C):   56.5 ml 33.88 ml/m  RA Volume:   18.40 ml  11.03 ml/m LA Vol (A4C):   39.3 ml 23.56 ml/m LA Biplane Vol: 49.4 ml 29.62 ml/m AORTIC VALVE LVOT Vmax:   96.30 cm/s LVOT Vmean:  71.700 cm/s LVOT VTI:    0.254 m  AORTA Ao Root diam: 3.00 cm Ao Asc diam:  3.00 cm  MITRAL VALVE               TRICUSPID VALVE MV Area (PHT): 3.31 cm    TR Peak grad:   19.4 mmHg MV Decel Time: 229 msec    TR Vmax:        220.00 cm/s MV E velocity: 88.10 cm/s MV A velocity: 96.70 cm/s  SHUNTS MV E/A ratio:  0.91        Systemic VTI:  0.25 m Systemic Diam: 1.90 cm  Charlton Haws MD Electronically signed by Charlton Haws MD Signature Date/Time: 05/07/2022/11:50:43 AM    Final   TEE  ECHO TEE 07/05/2022  Narrative TRANSESOPHOGEAL ECHO REPORT    Patient Name:   MARKELL SCHRIER Date of Exam: 07/05/2022 Medical Rec #:  [id number]      Height:       62.0 in Accession #:    [id number]     Weight:       145.1 lb Date of Birth:  1955-12-18       BSA:          1.668 m Patient Age:    66 years       BP:           164/72 mmHg Patient Gender: F              HR:           90 bpm. Exam Location:  Inpatient  Procedure: Transesophageal Echo, Color Doppler, Cardiac Doppler and Saline Contrast Bubble Study  Indications:     Stroke i63.9  History:         Patient has prior history of Echocardiogram examinations, most recent 05/07/2022. Risk Factors:Hypertension and  Dyslipidemia.  Sonographer:     Irving Burton Senior RDCS Referring Phys:  Debbie Hatfield, J Diagnosing Phys: Weston Brass MD  PROCEDURE: After discussion of the risks and benefits of a TEE, an informed consent was obtained from the patient. TEE procedure time  Cardiology Office Note:    Date:  07/18/2023   ID:  Debbie Hatfield, DOB 05/01/1956, MRN [id number]  PCP:  Debbie Grandchild, MD  Cardiologist:  Debbie Herrlich, MD    Referring MD: Debbie Grandchild, MD    ASSESSMENT:    1. Chest pain of uncertain etiology   2. Precordial pain    PLAN:    In order of problems listed above:  Symptoms are nonanginal EKG is stable for further evaluation she will undergo cardiac CTA To alleviate esophageal symptoms I gave her a 2-week prescription of Carafate to use along with her PPI Hold aspirin for the next few weeks continue clopidogrel I will order the antiphospholipid test to be repeated if they remain abnormal she will need to consider warfarin therapy   Next appointment: She will be seen sooner in the office in 6 weeks   Medication Adjustments/Labs and Tests Ordered: Current medicines are reviewed at length with the patient today.  Concerns regarding medicines are outlined above.  Orders Placed This Encounter  Procedures   CT CORONARY MORPH W/CTA COR W/SCORE W/CA W/CM &/OR WO/CM   Cardiolipin antibodies, IgG, IgM, IgA   Beta-2 glycoprotein antibodies   Basic Metabolic Panel (BMET)   Beta-2-glycoprotein i abs, IgG/M/A   EKG 12-Lead   Meds ordered this encounter  Medications   sucralfate (CARAFATE) 1 GM/10ML suspension    Sig: Take 10 mLs (1 g total) by mouth in the morning, at noon, and at bedtime.    Dispense:  450 mL    Refill:  0   metoprolol tartrate (LOPRESSOR) 25 MG tablet    Sig: Take 1 tablet (25 mg total) by mouth once for 1 dose. Please take this medication 2 hours before CT.    Dispense:  1 tablet    Refill:  0     History of Present Illness:    Debbie Hatfield is a 67 y.o. 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  She found her office yesterday with a new complaint of chest pain.  She was recently seen by GI for evaluation of GERD which was not felt to be controlled with current therapy and advised endoscopy.  There have been multiple phone calls and on 1 GI recommended a stat CT of chest abdomen and pelvis and said it would be reasonable to also see cardiology.  She was seen by rheumatology 06/06/2023 for lupus.  And seen by hematology for antiphospholipid syndrome.  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 She is having nonanginal and nonexertional chest pain the last episode 2 days ago She is no known coronary artery disease and her EKG pattern is unchanged QS in V2 seen more often in normals due to lead placement than in the setting of previous septal infarction Has never had an ischemia evaluation will be set up for cardiac CTA To alleviate esophageal symptoms will hold aspirin and have her initiate Carafate. At her request I will order the labs for antiphospholipid antibody they were never repeated She is not having exertional chest pain chest pain that occurred radiate up into the right face and actually right eye from the chest. Past Medical History:  Diagnosis Date   Closed fracture of right distal radius    Gastroparesis    GERD (gastroesophageal reflux disease)    Hypertension    Irritable bowel disease    Lupus    Major depression    Stroke (HCC)    Trigeminal neuralgia     Current Medications: Current Meds  Medication Sig   acetaminophen (TYLENOL) 325 MG tablet Take 650 mg by mouth as needed.   acyclovir (ZOVIRAX) 400 MG tablet TAKE 1 TABLET(400 MG) BY MOUTH DAILY   baclofen (LIORESAL) 10 MG tablet Take 1 tablet (10 mg total) by mouth 3 (three) times daily.   carbamazepine (CARBATROL) 300 MG 12 hr capsule TAKE 1 CAPSULE(300 MG) BY MOUTH AT BEDTIME   clopidogrel  (PLAVIX) 75 MG tablet Take 1 tablet (75 mg total) by mouth daily.   diclofenac Sodium (VOLTAREN) 1 % GEL APPLY 2 GRAMS TO THE AFFECTED AREA(S) BY TOPICAL ROUTE 2-3 TIMES PER DAY   gabapentin (NEURONTIN) 300 MG capsule Take 600 mg by mouth 2 (two) times daily.   GI Cocktail (alum & mag hydroxide, lidocaine, dicyclomine) oral mixture Take 30 mLs by mouth 3 (three) times daily as needed. Swish and swallow 30 ml 3 times daily prn   hydrOXYzine (ATARAX) 10 MG tablet Take 1 tablet (10 mg total) by mouth every 8 (eight) hours as needed for anxiety.   meclizine (ANTIVERT) 25 MG tablet Take 25 mg by mouth daily as needed for dizziness.   metoprolol tartrate (LOPRESSOR) 25 MG tablet Take 1 tablet (25 mg total) by mouth once for 1 dose. Please take this medication 2 hours before CT.   pantoprazole (PROTONIX) 40 MG tablet Take 1 tablet (40 mg total) by mouth 2 (two) times daily.   rosuvastatin (CRESTOR) 20 MG tablet Take 1 tablet (20 mg total) by mouth daily.   sucralfate (CARAFATE) 1 GM/10ML suspension Take 10 mLs (1 g total) by mouth in the morning, at noon, and at bedtime.   telmisartan-hydrochlorothiazide (MICARDIS HCT) 40-12.5 MG tablet TAKE 1 TABLET BY MOUTH DAILY   tizanidine (ZANAFLEX) 2 MG capsule Take 4 mg by mouth at bedtime.   venlafaxine XR (EFFEXOR-XR) 37.5 MG 24 hr capsule TAKE 2 CAPSULES(75 MG) BY MOUTH AT BEDTIME   [DISCONTINUED] aspirin EC 81 MG tablet Take 1 tablet (81 mg total) by mouth daily. Swallow whole.      EKGs/Labs/Other Studies Reviewed:    The following studies were reviewed today:  Cardiac Studies & Procedures     STRESS TESTS  MYOCARDIAL PERFUSION IMAGING 06/12/2022  Narrative   The study is normal. The study is low risk.   No ST deviation was noted.   Left ventricular function is normal. Nuclear stress EF: 72 %. The left ventricular ejection fraction is hyperdynamic (>65%). End diastolic cavity size is normal.   Prior study not available for comparison.    ECHOCARDIOGRAM  ECHOCARDIOGRAM COMPLETE 05/07/2022  Narrative ECHOCARDIOGRAM REPORT    Patient Name:   Debbie Hatfield Date of Exam: 05/07/2022 Medical Rec #:  [id number]      Height:       62.0 in Accession #:    [id number]     Weight:       145.1 lb Date of Birth:  January 15, 1956       BSA:          1.668 m Patient Age:    66 years       BP:

## 2023-07-19 LAB — BASIC METABOLIC PANEL
BUN/Creatinine Ratio: 17 (ref 12–28)
BUN: 18 mg/dL (ref 8–27)
CO2: 22 mmol/L (ref 20–29)
Calcium: 9.7 mg/dL (ref 8.7–10.3)
Chloride: 102 mmol/L (ref 96–106)
Creatinine, Ser: 1.07 mg/dL — ABNORMAL HIGH (ref 0.57–1.00)
Glucose: 89 mg/dL (ref 70–99)
Potassium: 4 mmol/L (ref 3.5–5.2)
Sodium: 142 mmol/L (ref 134–144)
eGFR: 57 mL/min/{1.73_m2} — ABNORMAL LOW (ref >59)

## 2023-07-19 LAB — CARDIOLIPIN ANTIBODIES, IGG, IGM, IGA
Anticardiolipin IgA: <9 [APL'U]/mL (ref 0–11)
Anticardiolipin IgG: <9 [GPL'U]/mL (ref 0–14)
Anticardiolipin IgM: <9 [MPL'U]/mL (ref 0–12)

## 2023-07-19 LAB — BETA-2-GLYCOPROTEIN I ABS, IGG/M/A
Beta-2 Glyco 1 IgA: <9 GPI IgA units (ref 0–25)
Beta-2 Glyco 1 IgM: <9 GPI IgM units (ref 0–32)
Beta-2 Glyco I IgG: <9 GPI IgG units (ref 0–20)

## 2023-07-22 ENCOUNTER — Ambulatory Visit (INDEPENDENT_AMBULATORY_CARE_PROVIDER_SITE_OTHER): Payer: Medicare Other

## 2023-07-22 DIAGNOSIS — I63311 Cerebral infarction due to thrombosis of right middle cerebral artery: Secondary | ICD-10-CM

## 2023-07-22 DIAGNOSIS — R55 Syncope and collapse: Secondary | ICD-10-CM | POA: Diagnosis not present

## 2023-07-23 ENCOUNTER — Telehealth: Payer: Self-pay

## 2023-07-23 NOTE — Telephone Encounter (Signed)
-----   Message from Nurse Nyshaun Standage P sent at 07/19/2023  8:30 AM EDT -----  ----- Message ----- From: Lemar Lofty., MD Sent: 07/18/2023   5:14 PM EDT To: Loretha Stapler, RN  Tanner Vigna, Thanks for following up on this. You may reach out to patient tomorrow, and recommend to her the cross-sectional imaging that we discussed yesterday. At this point it is up to her to decide what she would like to do. GM ----- Message ----- From: Loretha Stapler, RN Sent: 07/18/2023   4:16 PM EDT To: Lemar Lofty., MD  Dr Meridee Score the pt did not go to the ED but I do see a cardiology note from today. ----- Message ----- From: Loretha Stapler, RN Sent: 07/18/2023   4:00 PM EDT To: Loretha Stapler, RN   ----- Message ----- From: Loretha Stapler, RN Sent: 07/18/2023  12:00 AM EDT To: Loretha Stapler, RN  Check for ED eval tomorrow

## 2023-07-23 NOTE — Telephone Encounter (Signed)
The pt had Xray and labs completed. GI cocktail sent to the pharmacy. The pt is following up with her PCP

## 2023-07-27 ENCOUNTER — Other Ambulatory Visit: Payer: Self-pay | Admitting: Internal Medicine

## 2023-07-27 ENCOUNTER — Other Ambulatory Visit: Payer: Self-pay | Admitting: Neurology

## 2023-07-27 ENCOUNTER — Encounter: Payer: Self-pay | Admitting: Internal Medicine

## 2023-07-27 DIAGNOSIS — F3341 Major depressive disorder, recurrent, in partial remission: Secondary | ICD-10-CM

## 2023-07-27 DIAGNOSIS — I10 Essential (primary) hypertension: Secondary | ICD-10-CM

## 2023-07-29 ENCOUNTER — Ambulatory Visit: Payer: Medicare Other | Admitting: Internal Medicine

## 2023-07-29 ENCOUNTER — Encounter: Payer: Self-pay | Admitting: Internal Medicine

## 2023-07-29 VITALS — BP 160/72 | HR 69 | Temp 98.1°F | Resp 16 | Ht 62.0 in | Wt 144.0 lb

## 2023-07-29 DIAGNOSIS — G500 Trigeminal neuralgia: Secondary | ICD-10-CM

## 2023-07-29 DIAGNOSIS — N1831 Chronic kidney disease, stage 3a: Secondary | ICD-10-CM

## 2023-07-29 DIAGNOSIS — F3341 Major depressive disorder, recurrent, in partial remission: Secondary | ICD-10-CM

## 2023-07-29 DIAGNOSIS — E785 Hyperlipidemia, unspecified: Secondary | ICD-10-CM

## 2023-07-29 DIAGNOSIS — E2839 Other primary ovarian failure: Secondary | ICD-10-CM

## 2023-07-29 DIAGNOSIS — I10 Essential (primary) hypertension: Secondary | ICD-10-CM | POA: Diagnosis not present

## 2023-07-29 LAB — URINALYSIS, ROUTINE W REFLEX MICROSCOPIC
Bilirubin Urine: NEGATIVE
Hgb urine dipstick: NEGATIVE
Leukocytes,Ua: NEGATIVE
Nitrite: NEGATIVE
RBC / HPF: NONE SEEN (ref 0–?)
Specific Gravity, Urine: 1.02 (ref 1.000–1.030)
Total Protein, Urine: NEGATIVE
Urine Glucose: NEGATIVE
Urobilinogen, UA: 0.2 (ref 0.0–1.0)
pH: 5.5 (ref 5.0–8.0)

## 2023-07-29 LAB — BASIC METABOLIC PANEL
BUN: 16 mg/dL (ref 6–23)
CO2: 29 meq/L (ref 19–32)
Calcium: 9.4 mg/dL (ref 8.4–10.5)
Chloride: 105 meq/L (ref 96–112)
Creatinine, Ser: 1.15 mg/dL (ref 0.40–1.20)
GFR: 49.55 mL/min — ABNORMAL LOW (ref >60.00)
Glucose, Bld: 103 mg/dL — ABNORMAL HIGH (ref 70–99)
Potassium: 4 meq/L (ref 3.5–5.1)
Sodium: 140 meq/L (ref 135–145)

## 2023-07-29 MED ORDER — ROSUVASTATIN CALCIUM 20 MG PO TABS
20.0000 mg | ORAL_TABLET | Freq: Every day | ORAL | 0 refills | Status: DC
Start: 2023-07-29 — End: 2023-11-03

## 2023-07-29 MED ORDER — VENLAFAXINE HCL ER 37.5 MG PO CP24
37.5000 mg | ORAL_CAPSULE | Freq: Every day | ORAL | 0 refills | Status: DC
Start: 2023-07-29 — End: 2023-10-18

## 2023-07-29 MED ORDER — TELMISARTAN-HCTZ 40-12.5 MG PO TABS
1.0000 | ORAL_TABLET | Freq: Every day | ORAL | 0 refills | Status: DC
Start: 2023-07-29 — End: 2023-10-03

## 2023-07-29 NOTE — Patient Instructions (Signed)
Hypertension, Adult High blood pressure (hypertension) is when the force of blood pumping through the arteries is too strong. The arteries are the blood vessels that carry blood from the heart throughout the body. Hypertension forces the heart to work harder to pump blood and may cause arteries to become narrow or stiff. Untreated or uncontrolled hypertension can lead to a heart attack, heart failure, a stroke, kidney disease, and other problems. A blood pressure reading consists of a higher number over a lower number. Ideally, your blood pressure should be below 120/80. The first ("top") number is called the systolic pressure. It is a measure of the pressure in your arteries as your heart beats. The second ("bottom") number is called the diastolic pressure. It is a measure of the pressure in your arteries as the heart relaxes. What are the causes? The exact cause of this condition is not known. There are some conditions that result in high blood pressure. What increases the risk? Certain factors may make you more likely to develop high blood pressure. Some of these risk factors are under your control, including: Smoking. Not getting enough exercise or physical activity. Being overweight. Having too much fat, sugar, calories, or salt (sodium) in your diet. Drinking too much alcohol. Other risk factors include: Having a personal history of heart disease, diabetes, high cholesterol, or kidney disease. Stress. Having a family history of high blood pressure and high cholesterol. Having obstructive sleep apnea. Age. The risk increases with age. What are the signs or symptoms? High blood pressure may not cause symptoms. Very high blood pressure (hypertensive crisis) may cause: Headache. Fast or irregular heartbeats (palpitations). Shortness of breath. Nosebleed. Nausea and vomiting. Vision changes. Severe chest pain, dizziness, and seizures. How is this diagnosed? This condition is diagnosed by  measuring your blood pressure while you are seated, with your arm resting on a flat surface, your legs uncrossed, and your feet flat on the floor. The cuff of the blood pressure monitor will be placed directly against the skin of your upper arm at the level of your heart. Blood pressure should be measured at least twice using the same arm. Certain conditions can cause a difference in blood pressure between your right and left arms. If you have a high blood pressure reading during one visit or you have normal blood pressure with other risk factors, you may be asked to: Return on a different day to have your blood pressure checked again. Monitor your blood pressure at home for 1 week or longer. If you are diagnosed with hypertension, you may have other blood or imaging tests to help your health care provider understand your overall risk for other conditions. How is this treated? This condition is treated by making healthy lifestyle changes, such as eating healthy foods, exercising more, and reducing your alcohol intake. You may be referred for counseling on a healthy diet and physical activity. Your health care provider may prescribe medicine if lifestyle changes are not enough to get your blood pressure under control and if: Your systolic blood pressure is above 130. Your diastolic blood pressure is above 80. Your personal target blood pressure may vary depending on your medical conditions, your age, and other factors. Follow these instructions at home: Eating and drinking  Eat a diet that is high in fiber and potassium, and low in sodium, added sugar, and fat. An example of this eating plan is called the DASH diet. DASH stands for Dietary Approaches to Stop Hypertension. To eat this way: Eat  plenty of fresh fruits and vegetables. Try to fill one half of your plate at each meal with fruits and vegetables. Eat whole grains, such as whole-wheat pasta, brown rice, or whole-grain bread. Fill about one  fourth of your plate with whole grains. Eat or drink low-fat dairy products, such as skim milk or low-fat yogurt. Avoid fatty cuts of meat, processed or cured meats, and poultry with skin. Fill about one fourth of your plate with lean proteins, such as fish, chicken without skin, beans, eggs, or tofu. Avoid pre-made and processed foods. These tend to be higher in sodium, added sugar, and fat. Reduce your daily sodium intake. Many people with hypertension should eat less than 1,500 mg of sodium a day. Do not drink alcohol if: Your health care provider tells you not to drink. You are pregnant, may be pregnant, or are planning to become pregnant. If you drink alcohol: Limit how much you have to: 0-1 drink a day for women. 0-2 drinks a day for men. Know how much alcohol is in your drink. In the [place] one drink equals one 12 oz bottle of beer (355 mL), one 5 oz glass of wine (148 mL), or one 1 oz glass of hard liquor (44 mL). Lifestyle  Work with your health care provider to maintain a healthy body weight or to lose weight. Ask what an ideal weight is for you. Get at least 30 minutes of exercise that causes your heart to beat faster (aerobic exercise) most days of the week. Activities may include walking, swimming, or biking. Include exercise to strengthen your muscles (resistance exercise), such as Pilates or lifting weights, as part of your weekly exercise routine. Try to do these types of exercises for 30 minutes at least 3 days a week. Do not use any products that contain nicotine or tobacco. These products include cigarettes, chewing tobacco, and vaping devices, such as e-cigarettes. If you need help quitting, ask your health care provider. Monitor your blood pressure at home as told by your health care provider. Keep all follow-up visits. This is important. Medicines Take over-the-counter and prescription medicines only as told by your health care provider. Follow directions carefully. Blood  pressure medicines must be taken as prescribed. Do not skip doses of blood pressure medicine. Doing this puts you at risk for problems and can make the medicine less effective. Ask your health care provider about side effects or reactions to medicines that you should watch for. Contact a health care provider if you: Think you are having a reaction to a medicine you are taking. Have headaches that keep coming back (recurring). Feel dizzy. Have swelling in your ankles. Have trouble with your vision. Get help right away if you: Develop a severe headache or confusion. Have unusual weakness or numbness. Feel faint. Have severe pain in your chest or abdomen. Vomit repeatedly. Have trouble breathing. These symptoms may be an emergency. Get help right away. Call 911. Do not wait to see if the symptoms will go away. Do not drive yourself to the hospital. Summary Hypertension is when the force of blood pumping through your arteries is too strong. If this condition is not controlled, it may put you at risk for serious complications. Your personal target blood pressure may vary depending on your medical conditions, your age, and other factors. For most people, a normal blood pressure is less than 120/80. Hypertension is treated with lifestyle changes, medicines, or a combination of both. Lifestyle changes include losing weight, eating a healthy,  low-sodium diet, exercising more, and limiting alcohol. This information is not intended to replace advice given to you by your health care provider. Make sure you discuss any questions you have with your health care provider. Document Revised: 08/08/2021 Document Reviewed: 08/08/2021 Elsevier Patient Education  2024 [employer].

## 2023-07-29 NOTE — Progress Notes (Unsigned)
Subjective:  Patient ID: Debbie Hatfield, female    DOB: 1956/05/25  Age: 67 y.o. MRN: [id number]  CC: Hypertension and Hyperlipidemia   HPI Debbie Hatfield presents for f/up ----  Discussed the use of AI scribe software for clinical note transcription with the patient, who gave verbal consent to proceed.  History of Present Illness   The patient, with a history of esophageal issues, recently underwent an endoscopy and colonoscopy. During the procedure, the physician performed several interventions in the esophagus, including the removal of a ring, hernia repair, scraping, and stretching. Two days post-procedure, the patient experienced chest pain radiating up to the carotid and head, which resolved after approximately 30 minutes. Since then, the patient has been asymptomatic and has been taking Carafate as prescribed.  The patient also has a history of trigeminal neuralgia, for which she takes Carbatrol. She reports no recent seizures. Neurontin, which she takes for back pain and sciatica, was ineffective for the trigeminal neuralgia.  The patient also reports daily headaches. She has a known heart murmur but denies any symptoms of high or low blood pressure such as dizziness, lightheadedness, headache, or blurred vision. She also denies any swelling in the legs or feet.  The patient is also on Effexor, Plavix, and Telmisartan. She reports no side effects from her blood pressure medication. She has been advised to continue Plavix indefinitely.       Outpatient Medications Prior to Visit  Medication Sig Dispense Refill   acetaminophen (TYLENOL) 325 MG tablet Take 650 mg by mouth as needed.     acyclovir (ZOVIRAX) 400 MG tablet TAKE 1 TABLET(400 MG) BY MOUTH DAILY 90 tablet 1   baclofen (LIORESAL) 10 MG tablet Take 1 tablet (10 mg total) by mouth 3 (three) times daily. 90 each 5   clopidogrel (PLAVIX) 75 MG tablet TAKE 1 TABLET(75 MG) BY MOUTH DAILY 30 tablet 3   diclofenac Sodium (VOLTAREN)  1 % GEL APPLY 2 GRAMS TO THE AFFECTED AREA(S) BY TOPICAL ROUTE 2-3 TIMES PER DAY     gabapentin (NEURONTIN) 300 MG capsule Take 600 mg by mouth 2 (two) times daily.     hydrOXYzine (ATARAX) 10 MG tablet Take 1 tablet (10 mg total) by mouth every 8 (eight) hours as needed for anxiety. 270 tablet 0   meclizine (ANTIVERT) 25 MG tablet Take 25 mg by mouth daily as needed for dizziness.     pantoprazole (PROTONIX) 40 MG tablet Take 1 tablet (40 mg total) by mouth 2 (two) times daily. 90 tablet 3   sucralfate (CARAFATE) 1 GM/10ML suspension Take 10 mLs (1 g total) by mouth in the morning, at noon, and at bedtime. 450 mL 0   tizanidine (ZANAFLEX) 2 MG capsule Take 4 mg by mouth at bedtime.     carbamazepine (CARBATROL) 300 MG 12 hr capsule TAKE 1 CAPSULE(300 MG) BY MOUTH AT BEDTIME 90 capsule 0   GI Cocktail (alum & mag hydroxide, lidocaine, dicyclomine) oral mixture Take 30 mLs by mouth 3 (three) times daily as needed. Swish and swallow 30 ml 3 times daily prn 550 mL 0   rosuvastatin (CRESTOR) 20 MG tablet Take 1 tablet (20 mg total) by mouth daily. 90 tablet 1   telmisartan-hydrochlorothiazide (MICARDIS HCT) 40-12.5 MG tablet TAKE 1 TABLET BY MOUTH DAILY 30 tablet 0   venlafaxine XR (EFFEXOR-XR) 37.5 MG 24 hr capsule TAKE 2 CAPSULES(75 MG) BY MOUTH AT BEDTIME 180 capsule 0   metoprolol tartrate (LOPRESSOR) 25 MG tablet Take 1  tablet (25 mg total) by mouth once for 1 dose. Please take this medication 2 hours before CT. 1 tablet 0   No facility-administered medications prior to visit.    ROS Review of Systems  Constitutional: Negative.  Negative for diaphoresis, fatigue and unexpected weight change.  HENT: Negative.    Eyes: Negative.   Respiratory: Negative.  Negative for cough, chest tightness and wheezing.   Cardiovascular:  Negative for chest pain, palpitations and leg swelling.  Gastrointestinal:  Negative for abdominal pain, constipation, diarrhea, nausea and vomiting.  Genitourinary:  Negative.  Negative for difficulty urinating, dysuria and hematuria.  Musculoskeletal: Negative.  Negative for arthralgias and myalgias.  Skin: Negative.   Neurological:  Positive for headaches. Negative for dizziness and weakness.  Psychiatric/Behavioral:  Positive for decreased concentration and dysphoric mood. The patient is nervous/anxious.     Objective:  BP (!) 160/72 (BP Location: Right Arm, Patient Position: Sitting, Cuff Size: Large)   Pulse 69   Temp 98.1 F (36.7 C) (Oral)   Resp 16   Ht 5\' 2"  (1.575 m)   Wt 144 lb (65.3 kg)   SpO2 95%   BMI 26.34 kg/m   BP Readings from Last 3 Encounters:  07/29/23 (!) 160/72  07/18/23 132/82  07/09/23 (!) 180/80    Wt Readings from Last 3 Encounters:  07/29/23 144 lb (65.3 kg)  07/18/23 143 lb (64.9 kg)  07/09/23 142 lb (64.4 kg)    Physical Exam Vitals reviewed.  Constitutional:      Appearance: Normal appearance.  HENT:     Mouth/Throat:     Mouth: Mucous membranes are moist.  Eyes:     General: No scleral icterus.    Conjunctiva/sclera: Conjunctivae normal.     Pupils: Pupils are equal, round, and reactive to light.  Cardiovascular:     Rate and Rhythm: Normal rate and regular rhythm.     Heart sounds: Murmur heard.     Systolic murmur is present with a grade of 2/6.     No diastolic murmur is present.  Pulmonary:     Effort: Pulmonary effort is normal.     Breath sounds: No stridor. No wheezing, rhonchi or rales.  Abdominal:     General: Abdomen is flat.     Palpations: There is no mass.     Tenderness: There is no abdominal tenderness. There is no guarding.     Hernia: No hernia is present.  Musculoskeletal:        General: No swelling.     Cervical back: Neck supple.     Right lower leg: No edema.     Left lower leg: No edema.  Lymphadenopathy:     Cervical: No cervical adenopathy.  Skin:    General: Skin is dry.     Findings: No rash.  Neurological:     General: No focal deficit present.      Mental Status: She is alert. Mental status is at baseline.  Psychiatric:        Mood and Affect: Mood normal.        Behavior: Behavior normal.     Lab Results  Component Value Date   WBC 7.4 07/10/2023   HGB 12.2 07/10/2023   HCT 37.8 07/10/2023   PLT 339.0 07/10/2023   GLUCOSE 103 (H) 07/29/2023   CHOL 164 12/25/2022   TRIG 84 12/25/2022   HDL 103 12/25/2022   LDLCALC 46 12/25/2022   ALT 20 06/06/2023   AST 23 06/06/2023  NA 140 07/29/2023   K 4.0 07/29/2023   CL 105 07/29/2023   CREATININE 1.15 07/29/2023   BUN 16 07/29/2023   CO2 29 07/29/2023   TSH 2.11 01/21/2023   INR 1.1 (H) 07/10/2023   HGBA1C 5.7 (H) 05/08/2022    DG Abd 2 Views  Result Date: 07/10/2023 CLINICAL DATA:  Rectal bleeding status post endoscopy and colonoscopy yesterday. EXAM: ABDOMEN - 2 VIEW COMPARISON:  None Available. FINDINGS: The bowel gas pattern is normal. The right diaphragm has been excluded from the exam in the upright view. No definitive free air identified. Cholecystectomy clips are present. There are no suspicious calcifications. No fractures are seen. IMPRESSION: 1. No acute abnormalities. No free air identified. 2. Cholecystectomy clips. Electronically Signed   By: Darliss Cheney M.D.   On: 07/10/2023 15:11   DG Chest 2 View  Result Date: 07/10/2023 CLINICAL DATA:  Chest pain EXAM: CHEST - 2 VIEW COMPARISON:  Chest x-ray 01/07/2021 FINDINGS: The heart size and mediastinal contours are within normal limits. Both lungs are clear. The visualized skeletal structures are unremarkable. Loop recorder device is seen in the anterior left chest. There surgical clips in the right abdomen. IMPRESSION: No active cardiopulmonary disease. Electronically Signed   By: Darliss Cheney M.D.   On: 07/10/2023 15:09    Assessment & Plan:   Primary hypertension- Her blood pressure is not adequately well-controlled and she is symptomatic.  Will restart the ARB and thiazide diuretic. -     Telmisartan-HCTZ; Take  1 tablet by mouth daily.  Dispense: 90 tablet; Refill: 0 -     Basic metabolic panel; Future -     Urinalysis, Routine w reflex microscopic; Future -     AMB Referral [facility] Care Management  Recurrent major depressive disorder, in partial remission (HCC) -     Venlafaxine HCl ER; Take 1 capsule (37.5 mg total) by mouth daily with breakfast.  Dispense: 180 capsule; Refill: 0  Trigeminal neuralgia- Her carbamazepine level is undetectable. -     Carbamazepine level, total; Future  Stage 3a chronic kidney disease (HCC)- Her renal function is stable. -     Basic metabolic panel; Future -     Urinalysis, Routine w reflex microscopic; Future  Hyperlipidemia LDL goal <100 -     Rosuvastatin Calcium; Take 1 tablet (20 mg total) by mouth daily.  Dispense: 90 tablet; Refill: 0  Estrogen deficiency -     DG Bone Density; Future     Follow-up: Return in about 3 months (around 10/29/2023).  [name] Linger, MD

## 2023-07-29 NOTE — Telephone Encounter (Signed)
Last seen on 01/23/23 Follow up scheduled on 08/07/23

## 2023-07-30 LAB — CARBAMAZEPINE LEVEL, TOTAL: Carbamazepine Lvl: <2 mg/L — ABNORMAL LOW (ref 4.0–12.0)

## 2023-07-31 ENCOUNTER — Telehealth (HOSPITAL_COMMUNITY): Payer: Self-pay | Admitting: *Deleted

## 2023-07-31 NOTE — Telephone Encounter (Signed)
Received call from patient regarding upcoming cardiac imaging study; pt verbalizes understanding of appt date/time, parking situation and where to check in, pre-test NPO status and medications ordered, and verified current allergies; name and call back number provided for further questions should they arise Johney Frame RN Navigator Cardiac Imaging [facility] [phone] office [phone] cell

## 2023-07-31 NOTE — Telephone Encounter (Signed)
Attempted to call patient regarding upcoming cardiac CT appointment. Left message on voicemail with name and callback number Hayley Sharpe RN Navigator Cardiac Imaging [facility] [phone] Office   

## 2023-08-01 ENCOUNTER — Ambulatory Visit (HOSPITAL_COMMUNITY)
Admission: RE | Admit: 2023-08-01 | Discharge: 2023-08-01 | Disposition: A | Discharge status: Home / Self Care | Payer: Medicare Other | Source: Ambulatory Visit | Attending: Cardiology | Admitting: Cardiology

## 2023-08-01 ENCOUNTER — Telehealth: Payer: Self-pay

## 2023-08-01 ENCOUNTER — Encounter: Payer: Self-pay | Admitting: Neurology

## 2023-08-01 DIAGNOSIS — R072 Precordial pain: Secondary | ICD-10-CM | POA: Diagnosis present

## 2023-08-01 MED ORDER — NITROGLYCERIN 0.4 MG SL SUBL
SUBLINGUAL_TABLET | SUBLINGUAL | Status: AC
  Filled 2023-08-01: qty 2

## 2023-08-01 MED ORDER — NITROGLYCERIN 0.4 MG SL SUBL
0.8000 mg | SUBLINGUAL_TABLET | Freq: Once | SUBLINGUAL | Status: AC
  Administered 2023-08-01: 0.8 mg via SUBLINGUAL

## 2023-08-01 MED ORDER — NITROGLYCERIN 0.4 MG SL SUBL: 0.8000 mg | SUBLINGUAL_TABLET | Freq: Once | SUBLINGUAL | Status: DC

## 2023-08-01 MED ORDER — IOHEXOL 350 MG/ML SOLN
95.0000 mL | Freq: Once | INTRAVENOUS | Status: AC | PRN
  Administered 2023-08-01: 95 mL via INTRAVENOUS

## 2023-08-01 NOTE — Progress Notes (Signed)
Care Guide Note  08/01/2023 Name: Debbie Hatfield MRN: [id number] DOB: 1956-02-11  Referred by: Etta Grandchild, MD Reason for referral : Care Coordination (Outreach to schedule with Pharm d )   Debbie Hatfield is a 67 y.o. year old female who is a primary care patient of Etta Grandchild, MD. Debbie Hatfield was referred to the pharmacist for assistance related to HTN.    Successful contact was made with the patient to discuss pharmacy services including being ready for the pharmacist to call at least 5 minutes before the scheduled appointment time, to have medication bottles and any blood sugar or blood pressure readings ready for review. The patient agreed to meet with the pharmacist via with the pharmacist via telephone visit on (date/time).  08/08/2023  Penne Lash, RMA Care Guide [facility]  [place], [place] [address] Direct Dial: [phone] [email or web address]

## 2023-08-02 ENCOUNTER — Encounter: Payer: Self-pay | Admitting: Cardiology

## 2023-08-02 ENCOUNTER — Other Ambulatory Visit: Payer: Self-pay | Admitting: Internal Medicine

## 2023-08-02 DIAGNOSIS — E2839 Other primary ovarian failure: Secondary | ICD-10-CM

## 2023-08-05 ENCOUNTER — Telehealth: Payer: Self-pay | Admitting: Cardiology

## 2023-08-05 NOTE — Telephone Encounter (Signed)
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  There was a message she may benefit from a TEE she has had 1 done.  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  She has good healthcare literacy she is a Engineer, civil (consulting) and at this point in time elects not to have further diagnostic testing and I agree with her.

## 2023-08-07 ENCOUNTER — Encounter: Payer: Self-pay | Admitting: Neurology

## 2023-08-07 ENCOUNTER — Ambulatory Visit (INDEPENDENT_AMBULATORY_CARE_PROVIDER_SITE_OTHER): Payer: Medicare Other | Admitting: Neurology

## 2023-08-07 VITALS — BP 132/68 | HR 64 | Ht 62.0 in | Wt 146.5 lb

## 2023-08-07 DIAGNOSIS — M62838 Other muscle spasm: Secondary | ICD-10-CM

## 2023-08-07 DIAGNOSIS — F09 Unspecified mental disorder due to known physiological condition: Secondary | ICD-10-CM

## 2023-08-07 DIAGNOSIS — R260 Ataxic gait: Secondary | ICD-10-CM

## 2023-08-07 DIAGNOSIS — I63311 Cerebral infarction due to thrombosis of right middle cerebral artery: Secondary | ICD-10-CM | POA: Diagnosis not present

## 2023-08-07 DIAGNOSIS — D6861 Antiphospholipid syndrome: Secondary | ICD-10-CM | POA: Diagnosis not present

## 2023-08-07 MED ORDER — BACLOFEN 10 MG PO TABS: 10.0000 mg | ORAL_TABLET | Freq: Three times a day (TID) | ORAL | 3 refills | Status: DC

## 2023-08-07 MED ORDER — CLOPIDOGREL BISULFATE 75 MG PO TABS: ORAL_TABLET | ORAL | 4 refills | Status: DC

## 2023-08-07 NOTE — Progress Notes (Signed)
Carelink Summary Report / Loop Recorder 

## 2023-08-07 NOTE — Progress Notes (Signed)
[facility]  PATIENT: Debbie Hatfield DOB: 1956/01/10  REFERRING DOCTOR OR PCP: [name] Linger, MD (PCP); Elmer Picker, NP (referring provider) SOURCE: Patient, notes from hospital admission, imaging and lab reports, MRI and CT images personally reviewed.  _________________________________   HISTORICAL  CHIEF COMPLAINT:  Chief Complaint  Patient presents with   Room 11    Pt is here Alone. Pt states that she is still having the pain in her right leg as when she had her stroke. Pt states that her memory has gotten worse since her last appointment. Pt states that she has been having chest pain lately. Pt states that she had pain in her carotid artery that shot up to her head that caused her to be dizzy and have nausea.    She is a 67 year old woman with history of stroke and antiphospholipid syndrome  HISTORY OF PRESENT ILLNESS  She is reporting more pain in the right buttock.   She has been told by Dr. Ethelene Hal that she has piriformis syndrome.   He is going to do an injection soon.  She was also prescribed percocet but rarely takes  She is also reporting more difficulty with short term memory.  She still has impairments including word finding difficult, focus/processing (auditory > visual).   She scored 24/30 (borderline mild cognitive impairment) but had 5/5 recall at 20 minutes so likely more in low normal range.   She does take gabapentin, tizanidine and baclofen and meclizine  She has SLE/RA and sees Dr. Corliss Skains.   She has antiphospholipid antibodies and SLE (dx 30 years ago).  y   She was placed on Imuran and had difficulty tolerating it.   She also had difficulty tolerating methotrexate.    Plaquenil caused eye issues.   Ranae Plumber was discussed at yesterday's visit with rheum  She is on aspirin and plavix DAPT for prophylaxis.   She also has a PFO and saw Dr. Excell Seltzer.  Closure has not been recommended.    She has phasic spasms of right leg/hip that started after the  stroke.  These occur 3-4 times a day, worse at night..  Tizanifine had not helped and caused s.e. so she takes just at night.     She can only partially tolerated baclofen 10 mg bid but it does help the most   It makes her dizzy.   Marland KitchenSpasms can be painful.       She stopped tramadol.  She is also on gabapentin 600 tid and 4 mg tizanidine .      She snores and has excessive daytime sleepiness.  She often dozes off watching TV and falls asleep easily during the day.  A home sleep study 07/17/2022 did not show any significant OSA (AHI 4.6).      08/07/2023   11:17 AM  Montreal Cognitive Assessment   Visuospatial/ Executive (0/5) 4  Naming (0/3) 3  Attention: Read list of digits (0/2) 2  Attention: Read list of letters (0/1) 1  Attention: Serial 7 subtraction starting at 100 (0/3) 1  Language: Repeat phrase (0/2) 1  Language : Fluency (0/1) 1  Abstraction (0/2) 2  Delayed Recall (0/5) 3  Orientation (0/6) 6  Total 24  Adjusted Score (based on education) 24   At 20 minutes, she was able to recall all 5 of the words   History of stroke: She was working as an Charity fundraiser.  She was driving to work 1/61/0960 3 AM when she noted her left face was  drooping and she felt speech unable to speak. At first she had difficulty with word finding and slurring.  She stuttered a little.     She went to [employer] where she worked and another Engineer, civil (consulting) also noted the facial droop and speech issues.  Her tongue felt thick and she felt numb on the left side.Marland Kitchen  She went to the ED at [facility].  She had a CT, MRi and tenecteplase (TNK) thrombolytic therapy quickly.   She saw Dr. Roda Shutters.   After TNK therapy, facial weakness and speech started to improve 8 hours later.   She is seeing speech therapy and is near baseline now..     She noted some patchy visual changes around the time of the stroke x 2 week.       She notes mild cognitive issues since the stroke but not before.     Other neurologic events:  She had bells palsy many  years ago and has left Trigeminal neuralgia x 30 years.  She has noted left leg weakness x 10 years that is progressing.  She can walk one hour but is slower than others.     The onset of the leg weakness was fairly sudden around 10 years ago.   Gait has slowly worsened.  She has 3 falls this year.  She has trouble getting up from the floor.     She has left anterolateral thigh numbness.   She has visual changes due to Plaquenil toxicity (for SLE vs undifferentiated CTD).   Imaging: MRI of the brain 05/07/2022 showed an acute stroke in the right frontoparietal region.  She has multiple T2/FLAIR hypertense foci.  Many are nonspecific but some are periventricular and radially oriented to the ventricles.  CT angiogram showed no large vessel occlusion or significant stenosis.  CT angiogram of the head and neck 05/07/2022 showed no emergent large vessel occlusion.  A thyroid nodule was noted.  MRI of the cervical and thoracic spine 06/24/2022 shows a normal spinal cord.  In the cervical spine there are mild degenerative changes at C5-C6 and C6-C7.  No spinal stenosis or nerve root compression.  MRI of the lumbar spine from 06/04/2022:   At L2-L3, there is minimal retrolisthesis and disc bulging.  There is moderate right facet hypertrophy.  No spinal stenosis.  Mild left foraminal narrowing but no nerve root compression.    At L3-L4, there is minimal anterolisthesis associated with severe facet hypertrophy and other degenerative change.  There is moderately severe left and mild right foraminal narrowing with potential for left L3 nerve root compression.    L4-L5: There is minimal anterolisthesis and right greater than left facet hypertrophy and other degenerative change causing moderately severe right foraminal narrowing with potential for right L4 nerve root compression.  No spinal stenosis.  REVIEW OF SYSTEMS: Constitutional: No fevers, chills, sweats, or change in appetite Eyes: No visual changes, double  vision, eye pain Ear, nose and throat: No hearing loss, ear pain, nasal congestion, sore throat Cardiovascular: No chest pain, palpitations Respiratory:  No shortness of breath at rest or with exertion.   No wheezes GastrointestinaI: No nausea, vomiting, diarrhea, abdominal pain, fecal incontinence Genitourinary:  No dysuria, urinary retention or frequency.  No nocturia. Musculoskeletal:  No neck pain, back pain Integumentary: No rash, pruritus, skin lesions Neurological: as above Psychiatric: No depression at this time.  No anxiety Endocrine: No palpitations, diaphoresis, change in appetite, change in weigh or increased thirst Hematologic/Lymphatic:  No anemia, purpura,  petechiae. Allergic/Immunologic: No itchy/runny eyes, nasal congestion, recent allergic reactions, rashes  ALLERGIES: Allergies  Allergen Reactions   Plaquenil [Hydroxychloroquine]     Retinal damage   Sulfa Antibiotics Nausea And Vomiting   Penicillins Rash    HOME MEDICATIONS:  Current Outpatient Medications:    acetaminophen (TYLENOL) 325 MG tablet, Take 650 mg by mouth as needed., Disp: , Rfl:    acyclovir (ZOVIRAX) 400 MG tablet, TAKE 1 TABLET(400 MG) BY MOUTH DAILY, Disp: 90 tablet, Rfl: 1   baclofen (LIORESAL) 10 MG tablet, Take 1 tablet (10 mg total) by mouth 3 (three) times daily., Disp: 90 each, Rfl: 5   clopidogrel (PLAVIX) 75 MG tablet, TAKE 1 TABLET(75 MG) BY MOUTH DAILY, Disp: 30 tablet, Rfl: 3   diclofenac Sodium (VOLTAREN) 1 % GEL, APPLY 2 GRAMS TO THE AFFECTED AREA(S) BY TOPICAL ROUTE 2-3 TIMES PER DAY, Disp: , Rfl:    gabapentin (NEURONTIN) 300 MG capsule, Take 600 mg by mouth 2 (two) times daily., Disp: , Rfl:    hydrOXYzine (ATARAX) 10 MG tablet, Take 1 tablet (10 mg total) by mouth every 8 (eight) hours as needed for anxiety., Disp: 270 tablet, Rfl: 0   meclizine (ANTIVERT) 25 MG tablet, Take 25 mg by mouth daily as needed for dizziness., Disp: , Rfl:    pantoprazole (PROTONIX) 40 MG tablet,  Take 1 tablet (40 mg total) by mouth 2 (two) times daily., Disp: 90 tablet, Rfl: 3   rosuvastatin (CRESTOR) 20 MG tablet, Take 1 tablet (20 mg total) by mouth daily., Disp: 90 tablet, Rfl: 0   sucralfate (CARAFATE) 1 GM/10ML suspension, Take 10 mLs (1 g total) by mouth in the morning, at noon, and at bedtime., Disp: 450 mL, Rfl: 0   telmisartan-hydrochlorothiazide (MICARDIS HCT) 40-12.5 MG tablet, Take 1 tablet by mouth daily., Disp: 90 tablet, Rfl: 0   tizanidine (ZANAFLEX) 2 MG capsule, Take 4 mg by mouth at bedtime., Disp: , Rfl:    venlafaxine XR (EFFEXOR-XR) 37.5 MG 24 hr capsule, Take 1 capsule (37.5 mg total) by mouth daily with breakfast. (Patient taking differently: Take 37.5 mg by mouth 2 (two) times daily.), Disp: 180 capsule, Rfl: 0  PAST MEDICAL HISTORY: Past Medical History:  Diagnosis Date   Closed fracture of right distal radius    Gastroparesis    GERD (gastroesophageal reflux disease)    Hypertension    Irritable bowel disease    Lupus    Major depression    Stroke [facility])    Trigeminal neuralgia     PAST SURGICAL HISTORY: Past Surgical History:  Procedure Laterality Date   BUBBLE STUDY  07/05/2022   Procedure: BUBBLE STUDY;  Surgeon: Parke Poisson, MD;  Location: [facility] ENDOSCOPY;  Service: Cardiology;;   CARPAL TUNNEL RELEASE Left 05/15/2023   CHOLECYSTECTOMY     COLONOSCOPY     LOOP RECORDER IMPLANT  2021   ORIF WRIST FRACTURE Right 09/22/2020   Procedure: OPEN REDUCTION INTERNAL FIXATION (ORIF) WRIST FRACTURE;  Surgeon: Ernest Mallick, MD;  Location: [facility];  Service: Orthopedics;  Laterality: Right;    TEE WITHOUT CARDIOVERSION N/A 07/05/2022   Procedure: TRANSESOPHAGEAL ECHOCARDIOGRAM (TEE);  Surgeon: Parke Poisson, MD;  Location: [facility] ENDOSCOPY;  Service: Cardiology;  Laterality: N/A;    FAMILY HISTORY: Family History  Problem Relation Age of Onset   Heart block Mother        Pacemaker   Hypertension Mother     Depression Mother    Gout Mother  Macular degeneration Mother    Aneurysm Mother    Alcoholism Father    Congestive Heart Failure Maternal Grandmother    Diabetes Maternal Grandmother    Congestive Heart Failure Maternal Grandfather    Colon cancer Maternal Grandfather    Stroke Paternal Grandfather    Hypertension Paternal Grandfather    Esophageal cancer Neg Hx    Stomach cancer Neg Hx    Rectal cancer Neg Hx     SOCIAL HISTORY:  Social History   Socioeconomic History   Marital status: Widowed    Spouse name: Not on file   Number of children: 0   Years of education: Not on file   Highest education level: Bachelor's degree (e.g., BA, AB, BS)  Occupational History    Comment: RN ortho/neuro at [employer]  Tobacco Use   Smoking status: Never    Passive exposure: Never   Smokeless tobacco: Never  Vaping Use   Vaping status: Never Used  Substance and Sexual Activity   Alcohol use: Yes    Comment: social   Drug use: Never   Sexual activity: Not Currently    Birth control/protection: Post-menopausal  Other Topics Concern   Not on file  Social History Narrative   06/13/22 living with her mother   2 [place] Dew per Week   Social Determinants of Health   Financial Resource Strain: Medium Risk (07/27/2023)   Overall Financial Resource Strain (CARDIA)    Difficulty of Paying Living Expenses: Somewhat hard  Food Insecurity: No Food Insecurity (07/27/2023)   Hunger Vital Sign    Worried About Running Out of Food in the Last Year: Never true    Ran Out of Food in the Last Year: Never true  Transportation Needs: No Transportation Needs (07/27/2023)   PRAPARE - Administrator, Civil Service (Medical): No    Lack of Transportation (Non-Medical): No  Physical Activity: Sufficiently Active (07/27/2023)   Exercise Vital Sign    Days of Exercise per Week: 4 days    Minutes of Exercise per Session: 40 min  Stress: Stress Concern Present (07/27/2023)   [employer] of Occupational Health - Occupational Stress Questionnaire    Feeling of Stress : Very much  Social Connections: Moderately Isolated (07/27/2023)   Social Connection and Isolation Panel [NHANES]    Frequency of Communication with Friends and Family: Three times a week    Frequency of Social Gatherings with Friends and Family: Twice a week    Attends Religious Services: Never    Database administrator or Organizations: No    Attends Engineer, structural: More than 4 times per year    Marital Status: Widowed  Intimate Partner Violence: Not At Risk (10/24/2022)   Humiliation, Afraid, Rape, and Kick questionnaire    Fear of Current or Ex-Partner: No    Emotionally Abused: No    Physically Abused: No    Sexually Abused: No     PHYSICAL EXAM  Vitals:   08/07/23 1116  BP: 132/68  Pulse: 64  Weight: 146 lb 8 oz (66.5 kg)  Height: 5\' 2"  (1.575 m)     Body mass index is 26.8 kg/m.   General: The patient is well-developed and well-nourished and in no acute distress  HEENT:  Head is [place]/AT.  Sclera are anicteric.    Skin: Extremities are without rash or  edema.  Musculoskeletal:  Back is tender over right buttock/piriformis region.  Good ROM in hip  Neurologic Exam  Mental  status: The patient is alert and oriented x 3 at the time of the examination. No aphasia today.  Scored 24/30 on the MoCA but had 5/5 recall 15 minutes later so likely normal.  Cranial nerves: Extraocular movements are full.    She has symmetric facial sensation .Facial strength is normal.  Trapezius and sternocleidomastoid strength is normal. No dysarthria is noted.  The tongue is midline, and the patient has symmetric elevation of the soft palate. No obvious hearing deficits are noted.  Motor:  Muscle bulk is normal.   Tone is normal. Strength is  5 / 5 in the arms and right leg.  Strength was 4+/5 in the left EHL..   Sensory: Sensory testing is intact to pinprick, soft touch and vibration  sensation in the arms but she had reduced sensation to touch in the distribution of the left lateral femoral cutaneous nerve..  Coordination: Cerebellar testing reveals good finger-nose-finger and heel-to-shin bilaterally.  Gait and station: Station is normal.   Gait is near normal though the tandem gait is wide.. Romberg is negative.   Reflexes: Deep tendon reflexes are symmetric and normal in the arms ,  3 in legs with spread at knees but no clonus at ankles.    DIAGNOSTIC DATA (LABS, IMAGING, TESTING) - I reviewed patient records, labs, notes, testing and imaging myself where available.  Lab Results  Component Value Date   WBC 7.4 07/10/2023   HGB 12.2 07/10/2023   HCT 37.8 07/10/2023   MCV 92.7 07/10/2023   PLT 339.0 07/10/2023      Component Value Date/Time   NA 140 07/29/2023 1506   NA 142 07/18/2023 1422   K 4.0 07/29/2023 1506   CL 105 07/29/2023 1506   CO2 29 07/29/2023 1506   GLUCOSE 103 (H) 07/29/2023 1506   BUN 16 07/29/2023 1506   BUN 18 07/18/2023 1422   CREATININE 1.15 07/29/2023 1506   CREATININE 0.93 06/06/2023 0856   CALCIUM 9.4 07/29/2023 1506   PROT 7.2 06/06/2023 0856   PROT 7.3 12/25/2022 1047   ALBUMIN 4.8 12/25/2022 1047   AST 23 06/06/2023 0856   AST 24 11/01/2022 1333   ALT 20 06/06/2023 0856   ALT 18 11/01/2022 1333   ALKPHOS 116 12/25/2022 1047   BILITOT 0.5 06/06/2023 0856   BILITOT 0.4 12/25/2022 1047   BILITOT 0.4 11/01/2022 1333   GFRNONAA >60 11/01/2022 1333   GFRAA >90 07/07/2012 0610   Lab Results  Component Value Date   CHOL 164 12/25/2022   HDL 103 12/25/2022   LDLCALC 46 12/25/2022   TRIG 84 12/25/2022   CHOLHDL 1.6 12/25/2022   Lab Results  Component Value Date   HGBA1C 5.7 (H) 05/08/2022   No results found for: "VITAMINB12" Lab Results  Component Value Date   TSH 2.11 01/21/2023       ASSESSMENT AND PLAN  Cerebrovascular accident (CVA) due to thrombosis of right middle cerebral artery  (HCC)  Antiphospholipid antibody syndrome (HCC)  Ataxic gait  Muscle spasm of right leg  Mild cognitive disorder  She appears stable.  She has history of stroke and also has nonspecific white matter foci in the brain.  None in the spinal cord.  For the stroke and antiphospholipid syndrome, continue Plavix for now.    She will be getting an ESI and maybe piriformis injections. Continue gabapentin, balcofen for leg spasticity and dysesthesias Memory issues are likely due to reduced focus.   Discussed importance of good night's sleep. She will return  to see [place] in 1 year or call if new or worsening symptoms  40-minute office visit with the majority of the time spent face-to-face for history and physical, discussion/counseling and decision-making.  Additional time for MoCA test and with record review and documentation.  This visit is part of a comprehensive longitudinal care medical relationship regarding the patients primary diagnosis of stroke and related concerns.  Emnet Monk A. Epimenio Foot, MD, [facility] 08/07/2023, 11:40 AM Certified in Neurology, Clinical Neurophysiology, Sleep Medicine and Neuroimaging  [facility] [address] [place], [place] [address] [phone]

## 2023-08-08 ENCOUNTER — Other Ambulatory Visit: Payer: Medicare Other | Admitting: Pharmacist

## 2023-08-08 NOTE — Patient Instructions (Signed)
It was a pleasure speaking with you today!  Continue monitoring your blood pressure at home and let us know if you have frequent readings in the high 130s or higher systolic or diastolic 90s or higher.  Feel free to call with any questions or concerns!  Arbutus Leas, PharmD, BCPS [facility] Clinical Pharmacist [facility] [phone]

## 2023-08-08 NOTE — Progress Notes (Signed)
08/08/2023 Name: Debbie Hatfield MRN: [id number] DOB: [birth year]  Chief Complaint  Patient presents with   Hypertension   Medication Management    Debbie Hatfield is a 67 y.o. year old female who presented for a telephone visit.   They were referred to the pharmacist by their PCP for assistance in managing hypertension.     Subjective:  Care Team: Primary Care Provider: Etta Grandchild, MD ; Next Scheduled Visit: 10/29/2023  Medication Access/Adherence  Current Pharmacy:  [id number] [place], [place] - [address] AT [facility] OF [address] & [address] [place] [address] Phone: [phone] Fax: [phone]  [facility] - [place], [place] - [address] [place] [address] Phone: [phone] Fax: [phone]   Patient reports affordability concerns with their medications: No  Patient reports access/transportation concerns to their pharmacy: No  Patient reports adherence concerns with their medications:  No    Patient has concerns about how many medications she is taking, especially for pain issues.   Hypertension:  Current medications: telmisartan/hydrochlorothiazide 40/12.5 mg daily Medications previously tried: hydrochlorothiazide 12.5 mg (in addition to telmisartan/hydrochlorothiazide) - stopped due to hypotension BP at neurologist yesterday was 132/68  Patient has a validated, automated, upper arm home BP cuff Current blood pressure readings readings: usually 120/70 or lower. Occasionally up to 160s systolic but does not happen regularly  Pain: Current medications: baclofen 10 mg TID (not taking TID), gabapentin 600 mg TID, tizanidine 4 mg at bedtime, APAP arthritis 650 mg 2 tablets TID, Percocet 10/325 mg PRN (was given #15 in September, still has some left) She rates her pain a 6/10 Just saw ortho yesterday, diagnosed with piriformis syndrome, they are planning to do a  L5 nerve block Pt notes she also requested a referral to PT   Objective:  Lab Results  Component Value Date   HGBA1C 5.7 (H) 05/08/2022    Lab Results  Component Value Date   CREATININE 1.15 07/29/2023   BUN 16 07/29/2023   NA 140 07/29/2023   K 4.0 07/29/2023   CL 105 07/29/2023   CO2 29 07/29/2023    Lab Results  Component Value Date   CHOL 164 12/25/2022   HDL 103 12/25/2022   LDLCALC 46 12/25/2022   TRIG 84 12/25/2022   CHOLHDL 1.6 12/25/2022    Medications Reviewed Today     Reviewed by Bonita Quin, RPH (Pharmacist) on 08/08/23 at 1523  Med List Status: <None>   Medication Order Taking? Sig Documenting Provider Last Dose Status Informant  acetaminophen (TYLENOL) 325 MG tablet [id number] Yes Take 650 mg by mouth as needed. [provider] Taking Active Self           Med Note Garner Nash, MICHELE   Tue May 15, 2022  8:54 AM)    acyclovir (ZOVIRAX) 400 MG tablet [id number] Yes TAKE 1 TABLET(400 MG) BY MOUTH DAILY Etta Grandchild, MD Taking Active   baclofen (LIORESAL) 10 MG tablet [id number] Yes Take 1 tablet (10 mg total) by mouth 3 (three) times daily. Sater, Pearletha Furl, MD Taking Active   clopidogrel (PLAVIX) 75 MG tablet [id number] Yes One po qd Asa Lente, MD Taking Active   diclofenac Sodium (VOLTAREN) 1 % GEL [id number]  APPLY 2 GRAMS TO THE AFFECTED AREA(S) BY TOPICAL ROUTE 2-3 TIMES PER DAY [provider]  Active   gabapentin (NEURONTIN) 300 MG capsule [id number] Yes  Take 600 mg by mouth 2 (two) times daily. [provider] Taking Active Self  hydrOXYzine (ATARAX) 10 MG tablet [id number] Yes Take 1 tablet (10 mg total) by mouth every 8 (eight) hours as needed for anxiety. Etta Grandchild, MD Taking Active   meclizine (ANTIVERT) 25 MG tablet [id number] Yes Take 25 mg by mouth daily as needed for dizziness. [provider] Taking Active Self  pantoprazole (PROTONIX) 40 MG tablet [id number] Yes Take 1 tablet (40 mg total) by  mouth 2 (two) times daily. Boone Master M, PA-C Taking Active   rosuvastatin (CRESTOR) 20 MG tablet [id number] Yes Take 1 tablet (20 mg total) by mouth daily. Etta Grandchild, MD Taking Active   sucralfate (CARAFATE) 1 GM/10ML suspension [id number]  Take 10 mLs (1 g total) by mouth in the morning, at noon, and at bedtime. Baldo Daub, MD  Active   telmisartan-hydrochlorothiazide (MICARDIS HCT) 40-12.5 MG tablet [id number] Yes Take 1 tablet by mouth daily. Etta Grandchild, MD Taking Active   tizanidine (ZANAFLEX) 2 MG capsule [id number] Yes Take 4 mg by mouth at bedtime. [provider] Taking Active   venlafaxine XR (EFFEXOR-XR) 37.5 MG 24 hr capsule [id number] Yes Take 1 capsule (37.5 mg total) by mouth daily with breakfast.  Patient taking differently: Take 37.5 mg by mouth 2 (two) times daily.   Etta Grandchild, MD Taking Active Self              Assessment/Plan:   Hypertension: - Currently controlled, BP <130/80 - Recommended to continue monitoring BP at home and let us know if BP at home increases. Current average is well controlled. Continue current regimen.  Pain: - Recommended to avoid taking tizanidine and baclofen together - Recommended physical therapy and stretches - Continue follow up with ortho    Follow Up Plan: PRN  Arbutus Leas, PharmD, BCPS Clinical Pharmacist [facility] [phone]

## 2023-08-12 ENCOUNTER — Other Ambulatory Visit: Payer: Self-pay | Admitting: Internal Medicine

## 2023-08-12 DIAGNOSIS — G500 Trigeminal neuralgia: Secondary | ICD-10-CM

## 2023-08-21 LAB — CUP PACEART REMOTE DEVICE CHECK
Date Time Interrogation Session: 20241102230240
Implantable Pulse Generator Implant Date: 20220728

## 2023-08-26 ENCOUNTER — Ambulatory Visit (INDEPENDENT_AMBULATORY_CARE_PROVIDER_SITE_OTHER): Payer: Medicare Other

## 2023-08-26 ENCOUNTER — Encounter: Payer: Self-pay | Admitting: Cardiology

## 2023-08-26 DIAGNOSIS — R55 Syncope and collapse: Secondary | ICD-10-CM

## 2023-08-27 NOTE — Telephone Encounter (Signed)
Patient is following up requesting a call back to discuss patient message.

## 2023-08-28 ENCOUNTER — Encounter: Payer: Self-pay | Admitting: Cardiology

## 2023-08-28 ENCOUNTER — Ambulatory Visit (HOSPITAL_BASED_OUTPATIENT_CLINIC_OR_DEPARTMENT_OTHER): Payer: Medicare Other | Admitting: Family

## 2023-08-28 ENCOUNTER — Encounter (HOSPITAL_COMMUNITY): Payer: Self-pay | Admitting: Family

## 2023-08-28 ENCOUNTER — Other Ambulatory Visit: Payer: Self-pay

## 2023-08-28 ENCOUNTER — Encounter: Payer: Self-pay | Admitting: Internal Medicine

## 2023-08-28 VITALS — BP 148/77 | HR 99 | Ht 62.0 in | Wt 146.0 lb

## 2023-08-28 DIAGNOSIS — F411 Generalized anxiety disorder: Secondary | ICD-10-CM | POA: Diagnosis not present

## 2023-08-28 DIAGNOSIS — F3341 Major depressive disorder, recurrent, in partial remission: Secondary | ICD-10-CM | POA: Diagnosis not present

## 2023-08-28 MED ORDER — MIRTAZAPINE 15 MG PO TABS
15.0000 mg | ORAL_TABLET | Freq: Every day | ORAL | 2 refills | Status: DC
Start: 2023-08-28 — End: 2023-09-10
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
## 2023-08-28 NOTE — Telephone Encounter (Signed)
Patient is calling to follow up in regards of her bp. Please advise.

## 2023-08-29 ENCOUNTER — Other Ambulatory Visit: Payer: Self-pay | Admitting: Cardiology

## 2023-08-29 MED ORDER — CLONIDINE HCL 0.1 MG PO TABS: ORAL_TABLET | ORAL | 1 refills | Status: DC

## 2023-08-29 NOTE — Telephone Encounter (Signed)
This has been addressed in a separate encounter.  Please see that encounter for complete details.

## 2023-08-30 DIAGNOSIS — G5762 Lesion of plantar nerve, left lower limb: Secondary | ICD-10-CM

## 2023-08-30 HISTORY — DX: Lesion of plantar nerve, left lower limb: G57.62

## 2023-09-05 ENCOUNTER — Telehealth: Payer: Self-pay | Admitting: Internal Medicine

## 2023-09-05 ENCOUNTER — Telehealth (HOSPITAL_COMMUNITY): Payer: Self-pay

## 2023-09-05 NOTE — Telephone Encounter (Signed)
This provider followed up with patient telephonically.  She reports experiencing dizzy spells.  Reported history related to leg spasm.  States her primary care provider started her on baclofen 3 times daily.  Debbie Hatfield stated she was taking baclofen twice a day and has taking Remeron 15 mg nightly for the past 2 nights without any adverse reactions.    States she recently added a third dose of baclofen to her medication regimen so she is unsure if her dizziness spells came from the baclofen or the Remeron.    States she had taken Remeron for the past 2 nights without any side effects however, when she introduced the third dose of baclofen at night she woke up nauseated and dizzy.    Plan: Patient encouraged to discontinue Remeron until pain is under control.  Patient to keep outpatient follow-up appointment with this provider December 4.   Patient to continue to monitor symptoms.  Support, encouragement reassurance was provided.

## 2023-09-05 NOTE — Telephone Encounter (Signed)
Patient called this morning, she is wanting to stop the Remeron to see if that is what is causing her to be dizzy. Patient says she is also having some memory loss. She would like a call form you please.

## 2023-09-05 NOTE — Telephone Encounter (Signed)
Returned patient's call. She experienced an episode of extreme dizziness/lethargy after taking remeron, baclofen, meclizine, and gabapentin last night. She is on baclofen for pain, meclizine for dizziness, and psych recently started remeron. Confirmed that it is likely an interaction between all of these meds as they can all enhance the CNS depressant affects of each other. Psych already advised her to stop remeron for now. Recommended limiting baclofen and meclizine use to only as needed and space them away from gabapentin as well. She has follow up with psych 12/4 and with ortho on Monday.  Arbutus Leas, PharmD, BCPS Clinical Pharmacist [facility] [phone]

## 2023-09-05 NOTE — Telephone Encounter (Signed)
Pt called wanting to speak you about a medication reaction pt states it is urgent. Please advise.

## 2023-09-06 ENCOUNTER — Encounter: Payer: Self-pay | Admitting: Cardiology

## 2023-09-06 ENCOUNTER — Telehealth: Payer: Self-pay

## 2023-09-06 NOTE — Telephone Encounter (Signed)
Called patient and informed her of Dr. Madireddy's recommendation below:  "Symptoms likely related to anxiety and uncontrolled blood pressures overnight.  Medication list does not appear to be properly reconciled. There is no amlodipine [Norvasc] listed on her medications in the chart. Listed medications are telmisartan hydrochlorothiazide combination scheduled prescription and clonidine as needed. Continue with current medications, hydrate herself well. Caution while changing position. Schedule for follow-up visit with Dr. Dulce Sellar next available to review further management. If acutely worsening symptoms of chest pain or dizziness, head to the ER. "  Patient verbalized understanding and an appointment was scheduled for her to see Dr. Dulce Sellar on 11/26. Patient had no further questions at this time.

## 2023-09-06 NOTE — Telephone Encounter (Signed)
ILR alert for symptom, Other  Not at all active  Lying in bed. Chest pain mid chest radiates to right arm no nausea hadn't eaten before bed Event occurred 11/21 @ 23:33, EGM shows NSR Route to triage for CP LA, CVRS

## 2023-09-06 NOTE — Telephone Encounter (Signed)
Pt aware I am forwarding this to Dr. Hulen Shouts office to address her CP. Says she has messaged them but is still waiting to hear back.

## 2023-09-06 NOTE — Telephone Encounter (Signed)
Called patient and she reported that she had chest pain in the middle of the night last night in the middle of her chest that radiated to her right upper arm. Her blood pressure last night was 199/98. Patient has a prescription for Clonidine PRN which she took last night and her blood pressure came down to 177/74. She woke up this am and her blood pressure is 94/50 and she is a little dizzy. She states that she also takes Norvasc and Telmisartan which she has not taken today due to her low blood pressure. She states that she currently has no chest pain at this time and she is very concerned about her blood pressure.

## 2023-09-09 ENCOUNTER — Encounter: Payer: Self-pay | Admitting: Cardiology

## 2023-09-09 NOTE — Progress Notes (Unsigned)
Cardiology Office Note:    Date:  09/10/2023   ID:  JOELLE Hatfield, DOB 11-14-1955, MRN [id number]  PCP:  Etta Grandchild, MD  Cardiologist:  Norman Herrlich, MD    Referring MD: Etta Grandchild, MD    ASSESSMENT:    1. 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  Concerns regarding medicines are outlined above.  No orders of the defined types were placed in this encounter.  No orders of the defined types were placed in this encounter.    History of Present Illness:    Debbie Hatfield is a 67 y.o. 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  Repeat testing for antiphospholipid syndrome was not abnormal.  Compliance with diet, lifestyle and medications: Yes  She has developed resistant hypertension her average blood pressure now 176/80.  She has had to take clonidine and has had very severe hypotension afterwards To ask my's antihypertensive therapy I will add MRA spironolactone to her thiazide diuretic ARB we will plan to check in 1 week urine aldosterone renin level and a BMP with  her CKD and I will give her prescription to take Bystolic as needed greater than 150 systolic and if she is taking it frequently daily.  She will also have a renal vascular duplex.  I think we should stop taking clonidine. Past Medical History:  Diagnosis Date   Closed fracture of right distal radius    Gastroparesis    GERD (gastroesophageal reflux disease)    Hypertension    Irritable bowel disease    Lupus    Major depression    Stroke (HCC)    Trigeminal neuralgia     Current Medications: Current Meds  Medication Sig   acetaminophen (TYLENOL) 325 MG tablet Take 650 mg by mouth as needed for mild pain (pain score 1-3) or moderate pain (pain score 4-6).   acyclovir (ZOVIRAX) 400 MG tablet TAKE 1 TABLET(400 MG) BY MOUTH DAILY   baclofen (LIORESAL) 10 MG tablet Take 10 mg by mouth 3 (three) times daily as needed for muscle spasms (As tolerated).   carbamazepine (CARBATROL) 300 MG 12 hr capsule TAKE 1 CAPSULE(300 MG) BY MOUTH AT BEDTIME   clopidogrel (PLAVIX) 75 MG tablet One po qd   diclofenac Sodium (VOLTAREN) 1 % GEL Apply 2 g topically 4 (four) times daily.   gabapentin (NEURONTIN) 300 MG capsule Take 600 mg by mouth 2 (two) times daily.   hydrOXYzine (ATARAX) 10 MG tablet Take 1 tablet (10 mg total) by mouth every 8 (eight) hours as needed for anxiety.   meclizine (ANTIVERT) 25 MG tablet Take 25 mg by  mouth daily as needed for dizziness.   pantoprazole (PROTONIX) 40 MG tablet Take 1 tablet (40 mg total) by mouth 2 (two) times daily.   rosuvastatin (CRESTOR) 20 MG tablet Take 1 tablet (20 mg total) by mouth daily.   sucralfate (CARAFATE) 1 GM/10ML suspension Take 1 g by mouth 3 (three) times daily as needed (Ulcers).   telmisartan-hydrochlorothiazide (MICARDIS HCT) 40-12.5 MG tablet Take 1 tablet by mouth daily.   tizanidine (ZANAFLEX) 2 MG capsule Take 4 mg by mouth at bedtime.   venlafaxine XR (EFFEXOR-XR) 37.5 MG 24 hr capsule Take 1 capsule (37.5 mg total) by mouth daily with  breakfast. (Patient taking differently: Take 37.5 mg by mouth 2 (two) times daily.)   [DISCONTINUED] cloNIDine (CATAPRES) 0.1 MG tablet TAKE 1 TABLET BY MOUTH IN THE EVENING IF SYSTOLIC BLOOD PRESSURE IS GREATER THAN 170      EKGs/Labs/Other Studies Reviewed:    The following studies were reviewed today:  Cardiac Studies & Procedures     STRESS TESTS  MYOCARDIAL PERFUSION IMAGING 06/12/2022  Narrative   The study is normal. The study is low risk.   No ST deviation was noted.   Left ventricular function is normal. Nuclear stress EF: 72 %. The left ventricular ejection fraction is hyperdynamic (>65%). End diastolic cavity size is normal.   Prior study not available for comparison.   ECHOCARDIOGRAM  ECHOCARDIOGRAM COMPLETE 05/07/2022  Narrative ECHOCARDIOGRAM REPORT    Patient Name:   BRENYA Hatfield Date of Exam: 05/07/2022 Medical Rec #:  [id number]      Height:       62.0 in Accession #:    [id number]     Weight:       145.1 lb Date of Birth:  Oct 12, 1956       BSA:          1.668 m Patient Age:    66 years       BP:           167/80 mmHg Patient Gender: F              HR:           57 bpm. Exam Location:  Inpatient  Procedure: 2D Echo, Cardiac Doppler and Color Doppler  Indications:    Stroke I63.9  History:        Patient has prior history of Echocardiogram examinations, most recent 01/31/2021. Risk Factors:Hypertension.  Sonographer:    Lucendia Herrlich Sonographer#2:  Eulah Pont RDCS Referring Phys: [id number] Marvel Plan  IMPRESSIONS   1. Left ventricular ejection fraction, by estimation, is 60 to 65%. The left ventricle has normal function. The left ventricle has no regional wall motion abnormalities. Left ventricular diastolic parameters were normal. 2. Right ventricular systolic function is normal. The right ventricular size is normal. There is normal pulmonary artery systolic pressure. 3. Left atrial size was mildly dilated. 4. The mitral valve is abnormal.  Mild mitral valve regurgitation. No evidence of mitral stenosis. 5. The aortic valve is tricuspid. There is mild calcification of the aortic valve. There is mild thickening of the aortic valve. Aortic valve regurgitation is trivial. Aortic valve sclerosis is present, with no evidence of aortic valve stenosis. 6. The inferior vena cava is normal in size with greater than 50% respiratory variability, suggesting right atrial pressure of 3 mmHg.  FINDINGS Left Ventricle: Left ventricular ejection fraction, by estimation, is 60 to 65%. The left ventricle has normal function. The left ventricle has no regional wall motion  abnormalities. The left ventricular internal cavity size was normal in size. There is no left ventricular hypertrophy. Left ventricular diastolic parameters were normal.  Right Ventricle: The right ventricular size is normal. No increase in right ventricular wall thickness. Right ventricular systolic function is normal. There is normal pulmonary artery systolic pressure. The tricuspid regurgitant velocity is 2.20 m/s, and with an assumed right atrial pressure of 3 mmHg, the estimated right ventricular systolic pressure is 22.4 mmHg.  Left Atrium: Left atrial size was mildly dilated.  Right Atrium: Right atrial size was normal in size.  Pericardium: There is no evidence of pericardial effusion.  Mitral Valve: The mitral valve is abnormal. There is mild thickening of the mitral valve leaflet(s). There is mild calcification of the mitral valve leaflet(s). Mild mitral annular calcification. Mild mitral valve regurgitation. No evidence of mitral valve stenosis.  Tricuspid Valve: The tricuspid valve is normal in structure. Tricuspid valve regurgitation is mild . No evidence of tricuspid stenosis.  Aortic Valve: The aortic valve is tricuspid. There is mild calcification of the aortic valve. There is mild thickening of the aortic valve. Aortic valve regurgitation is trivial. Aortic valve  sclerosis is present, with no evidence of aortic valve stenosis.  Pulmonic Valve: The pulmonic valve was normal in structure. Pulmonic valve regurgitation is not visualized. No evidence of pulmonic stenosis.  Aorta: The aortic root is normal in size and structure.  Venous: The inferior vena cava is normal in size with greater than 50% respiratory variability, suggesting right atrial pressure of 3 mmHg.  IAS/Shunts: No atrial level shunt detected by color flow Doppler.   LEFT VENTRICLE PLAX 2D LVIDd:         4.00 cm     Diastology LVIDs:         2.20 cm     LV e' medial:    6.57 cm/s LV PW:         1.00 cm     LV E/e' medial:  13.4 LV IVS:        0.80 cm     LV e' lateral:   7.98 cm/s LVOT diam:     1.90 cm     LV E/e' lateral: 11.0 LV SV:         72 LV SV Index:   43 LVOT Area:     2.84 cm  LV Volumes (MOD) LV vol d, MOD A2C: 83.2 ml LV vol d, MOD A4C: 83.7 ml LV vol s, MOD A2C: 30.9 ml LV vol s, MOD A4C: 33.3 ml LV SV MOD A2C:     52.3 ml LV SV MOD A4C:     83.7 ml LV SV MOD BP:      50.3 ml  RIGHT VENTRICLE RV S prime:     11.90 cm/s TAPSE (M-mode): 2.0 cm  LEFT ATRIUM             Index        RIGHT ATRIUM           Index LA diam:        3.80 cm 2.28 cm/m   RA Area:     10.10 cm LA Vol (A2C):   56.5 ml 33.88 ml/m  RA Volume:   18.40 ml  11.03 ml/m LA Vol (A4C):   39.3 ml 23.56 ml/m LA Biplane Vol: 49.4 ml 29.62 ml/m AORTIC VALVE LVOT Vmax:   96.30 cm/s LVOT Vmean:  71.700 cm/s LVOT VTI:    0.254 m  AORTA Ao  Root diam: 3.00 cm Ao Asc diam:  3.00 cm  MITRAL VALVE               TRICUSPID VALVE MV Area (PHT): 3.31 cm    TR Peak grad:   19.4 mmHg MV Decel Time: 229 msec    TR Vmax:        220.00 cm/s MV E velocity: 88.10 cm/s MV A velocity: 96.70 cm/s  SHUNTS MV E/A ratio:  0.91        Systemic VTI:  0.25 m Systemic Diam: 1.90 cm  Charlton Haws MD Electronically signed by Charlton Haws MD Signature Date/Time: 05/07/2022/11:50:43 AM    Final    TEE  ECHO TEE 07/05/2022  Narrative TRANSESOPHOGEAL ECHO REPORT    Patient Name:   SHEMEKIA WOHLGEMUTH Date of Exam: 07/05/2022 Medical Rec #:  [id number]      Height:       62.0 in Accession #:    [id number]     Weight:       145.1 lb Date of Birth:  May 18, 1956       BSA:          1.668 m Patient Age:    66 years       BP:           164/72 mmHg Patient Gender: F              HR:           90 bpm. Exam Location:  Inpatient  Procedure: Transesophageal Echo, Color Doppler, Cardiac Doppler and Saline Contrast Bubble Study  Indications:     Stroke i63.9  History:         Patient has prior history of Echocardiogram examinations, most recent 05/07/2022. Risk Factors:Hypertension and Dyslipidemia.  Sonographer:     Irving Burton Senior RDCS Referring Phys:  Norman Herrlich, J Diagnosing Phys: Weston Brass MD  PROCEDURE: After discussion of the risks and benefits of a TEE, an informed consent was obtained from the patient. TEE procedure time was 16 minutes. The transesophogeal probe was passed without difficulty through the esophogus of the patient. Imaged were obtained with the patient in a left lateral decubitus position. Local oropharyngeal anesthetic was provided with Cetacaine. Sedation performed by different physician. The patient was monitored while under deep sedation. Anesthestetic sedation was provided intravenously by Anesthesiology: 335mg  of Propofol. Image quality was excellent. The patient's vital signs; including heart rate, blood pressure, and oxygen saturation; remained stable throughout the procedure. The patient developed no complications during the procedure.  IMPRESSIONS   1. Left ventricular ejection fraction, by estimation, is 65 to 70%. The left ventricle has normal function. The left ventricle has no regional wall motion abnormalities. 2. Right ventricular systolic function is normal. The right ventricular size is normal. 3. Left atrial size was mildly dilated. No left  atrial/left atrial appendage thrombus was detected. The LAA emptying velocity was 95 cm/s. 4. A small pericardial effusion is present. 5. The mitral valve is normal in structure. Trivial mitral valve regurgitation. No evidence of mitral stenosis. 6. The aortic valve is tricuspid. There is mild calcification of the aortic valve. Aortic valve regurgitation is trivial. Mild aortic valve stenosis. Aortic valve mean gradient measures 12.0 mmHg. 7. There is mild (Grade II) atheroma plaque involving the aortic arch and descending aorta. 8. Evidence of atrial level shunting detected by color flow Doppler. Agitated saline contrast bubble study was positive with shunting observed within 3-6 cardiac cycles suggestive of interatrial shunt.  There is a small patent foramen ovale with bidirectional shunting across atrial septum.  FINDINGS Left Ventricle: Left ventricular ejection fraction, by estimation, is 65 to 70%. The left ventricle has normal function. The left ventricle has no regional wall motion abnormalities. The left ventricular internal cavity size was normal in size.  Right Ventricle: The right ventricular size is normal. No increase in right ventricular wall thickness. Right ventricular systolic function is normal.  Left Atrium: Left atrial size was mildly dilated. No left atrial/left atrial appendage thrombus was detected. The LAA emptying velocity was 95 cm/s.  Right Atrium: Right atrial size was normal in size.  Pericardium: A small pericardial effusion is present.  Mitral Valve: The mitral valve is normal in structure. Trivial mitral valve regurgitation. No evidence of mitral valve stenosis.  Tricuspid Valve: The tricuspid valve is normal in structure. Tricuspid valve regurgitation is trivial. No evidence of tricuspid stenosis.  Aortic Valve: The aortic valve is tricuspid. There is mild calcification of the aortic valve. Aortic valve regurgitation is trivial. Mild aortic stenosis is present.  Aortic valve mean gradient measures 12.0 mmHg. Aortic valve peak gradient measures 17.2 mmHg. Aortic valve area, by VTI measures 1.83 cm.  Pulmonic Valve: The pulmonic valve was normal in structure. Pulmonic valve regurgitation is trivial. No evidence of pulmonic stenosis.  Aorta: The aortic root and ascending aorta are structurally normal, with no evidence of dilitation. There is mild (Grade II) atheroma plaque involving the aortic arch and descending aorta.  IAS/Shunts: There is redundancy of the interatrial septum. Evidence of atrial level shunting detected by color flow Doppler. Agitated saline contrast was given intravenously to evaluate for intracardiac shunting. Agitated saline contrast bubble study was positive with shunting observed within 3-6 cardiac cycles suggestive of interatrial shunt. A small patent foramen ovale is detected with bidirectional shunting across atrial septum.   LEFT VENTRICLE PLAX 2D LVOT diam:     2.00 cm LV SV:         78 LV SV Index:   47 LVOT Area:     3.14 cm   AORTIC VALVE AV Area (Vmax):    1.60 cm AV Area (Vmean):   1.70 cm AV Area (VTI):     1.83 cm AV Vmax:           207.50 cm/s AV Vmean:          156.000 cm/s AV VTI:            0.423 m AV Peak Grad:      17.2 mmHg AV Mean Grad:      12.0 mmHg LVOT Vmax:         106.00 cm/s LVOT Vmean:        84.600 cm/s LVOT VTI:          0.247 m LVOT/AV VTI ratio: 0.58  AORTA Ao Root diam: 3.00 cm Ao Asc diam:  3.00 cm   SHUNTS Systemic VTI:  0.25 m Systemic Diam: 2.00 cm  Weston Brass MD Electronically signed by Weston Brass MD Signature Date/Time: 07/05/2022/1:28:03 PM    Final   MONITORS  LONG TERM MONITOR-LIVE TELEMETRY (3-14 DAYS) 02/07/2021  Narrative Patch Wear Time:  14 days and 0 hours (2022-03-31T10:07:46-0400 to 2022-04-14T10:07:46-0400)  Patient had a min HR of 53 bpm, max HR of 185 bpm, and avg HR of 83 bpm. Predominant underlying rhythm was Sinus Rhythm. First  Degree AV Block was present. 8 Supraventricular Tachycardia runs occurred, the run with the fastest interval lasting 5 beats  with a max rate of 185 bpm, the longest lasting 9 beats with an avg rate of 129 bpm. Supraventricular Tachycardia was detected within +/- 45 seconds of symptomatic patient event(s). Isolated SVEs were rare (<1.0%), SVE Couplets were rare (<1.0%), and SVE Triplets were rare (<1.0%). Isolated VEs were rare (<1.0%, 1579), VE Couplets were rare (<1.0%, 1), and VE Triplets were rare (<1.0%, 1).  There were no pauses of 3 seconds or greater and no episodes of second or third-degree AV block.  Ventricular ectopy was rare with 1 couplet and 1 triplet.  Supraventricular ectopy was rare with no episodes of atrial fibrillation or flutter.  There were 8 brief runs of APCs, atrial tachycardia the longest 9 complexes rate of 129 bpm.  There were 19 triggered 11 diary events all associated with sinus rhythm sinus tachycardia.  None of the episodes were associated with occasional to frequent PVCs 3 the episodes with APCs.   Conclusion, first-degree AV block, rare ventricular and supraventricular ectopy however symptomatic events were predominantly associated with PVCs and APCs.   CT SCANS  CT CORONARY MORPH W/CTA COR W/SCORE 08/01/2023  Addendum 08/27/2023  9:47 PM ADDENDUM REPORT: 08/27/2023 21:44  EXAM: OVER-READ INTERPRETATION  CT CHEST  The following report is an over-read performed by radiologist Dr. Curly Shores [facility] on 08/27/2023. This over-read does not include interpretation of cardiac or coronary anatomy or pathology. The coronary CTA interpretation by the cardiologist is attached.  COMPARISON:  None.  FINDINGS: Cardiovascular:  See findings discussed in the body of the report.  Mediastinum/Nodes: No suspicious adenopathy identified. Imaged mediastinal structures are unremarkable.  Lungs/Pleura: Imaged lungs are clear. No pleural  effusion or pneumothorax.  Upper Abdomen: No acute abnormality.  Musculoskeletal: No chest wall abnormality. No acute osseous findings.  IMPRESSION: No acute extracardiac incidental findings identified.   Electronically Signed By: Layla Maw M.D. On: 08/27/2023 21:44  Narrative CLINICAL DATA:  67 Year-old Female  EXAM: Cardiac/Coronary  CTA  TECHNIQUE: The patient was scanned on a [employer].  FINDINGS: Scan was triggered in the descending thoracic aorta. Axial non-contrast 3 mm slices were carried out through the heart. The data set was analyzed on a dedicated work station and scored using the Agatson method. Gantry rotation speed was 250 msecs and collimation was .6 mm. 0.8 mg of sl NTG was given. The 3D data set was reconstructed in 5% intervals of the 67-82 % of the R-R cycle. Diastolic phases were analyzed on a dedicated work station using MPR, MIP and VRT modes. The patient received 95 cc of contrast.  Coronary Arteries:  Normal coronary origin. Right dominance.  Coronary Calcium Score:  Left anterior descending artery: 63  Total: 63  Percentile: 74th for age, sex, and race matched control.  Plaque Analysis: Unavailable  RCA is a large dominant artery that is not well seen distally. There is no significant plaque.  Left main is a large artery that gives rise to LAD, RI, and LCX arteries. There is no significant plaque.  LAD is a large vessel that gives rise to two diagonal vessels. Mild non-obstructive calcified plaque (25-49%) in the ostial LAD.  LCX is a non-dominant artery that gives rise to two OM branches. There is no significant plaque.  There is a ramus intermedius vessel with no significant plaque.  Other findings:  Aorta: Normal size.  Aortic atherosclerosis.  No dissection.  Main Pulmonary Artery: Normal size of the pulmonary artery.  Systemic Veins: Coronary sinus is markedly dilated,  18 mm. There is no left sided  SVC seen. Difficult to assess for bridging vein or fistula due to artifact; neither are visualized.  Aortic Valve:  Tri-leaflet.  AV calcium score 50.  Mitral valve: Mild mitral annular calcification  Normal pulmonary vein drainage into the left atrium.  Normal left atrial appendage without a thrombus.  Interatrial septum is grossly normal  Right Ventricle: Dilated size  Right Atrium: Dilated size  Pericardium: Normal thickness  Extra-cardiac findings: See attached radiology report for non-cardiac structures.  Artifact: Significant motion artifact, most affecting the RCA, and assessment of the coronary sinus  IMPRESSION: 1. Coronary calcium score of 63. This was 74th percentile for age, sex, and race matched control.  2. Normal coronary origin with right dominance.  3. CAD-RADS 2. Mild non-obstructive CAD (25-49%). Consider non-atherosclerotic causes of chest pain. Consider preventive therapy and risk factor modification.  4. Coronary sinus dilation. Consider TEE or Cardiac MRI if clinically indicated.  RECOMMENDATIONS: RECOMMENDATIONS The proposed cut-off value of 1,651 AU yielded a 93 % sensitivity and 75 % specificity in grading AS severity in patients with classical low-flow, low-gradient AS. Proposed different cut-off values to define severe AS for men and women as 2,065 AU and 1,274 AU, respectively. The joint European and American recommendations for the assessment of AS consider the aortic valve calcium score as a continuum - a very high calcium score suggests severe AS and a low calcium score suggests severe AS is unlikely.  Sunday Shams, et al. 2017 ESC/EACTS Guidelines for the management of valvular heart disease. Eur Heart J 2017;38:2739-91.  Coronary artery calcium (CAC) score is a strong predictor of incident coronary heart disease (CHD) and provides predictive information beyond traditional risk factors. CAC scoring is reasonable  to use in the decision to withhold, postpone, or initiate statin therapy in intermediate-risk or selected borderline-risk asymptomatic adults (age 68-75 years and LDL-C >=70 to <190 mg/dL) who do not have diabetes or established atherosclerotic cardiovascular disease (ASCVD).* In intermediate-risk (10-year ASCVD risk >=7.5% to <20%) adults or selected borderline-risk (10-year ASCVD risk >=5% to <7.5%) adults in whom a CAC score is measured for the purpose of making a treatment decision the following recommendations have been made:  If CAC = 0, it is reasonable to withhold statin therapy and reassess in 5 to 10 years, as long as higher risk conditions are absent (diabetes mellitus, family history of premature CHD in first degree relatives (males <55 years; females <65 years), cigarette smoking, LDL >=190 mg/dL or other independent risk factors).  If CAC is 1 to 99, it is reasonable to initiate statin therapy for patients >=80 years of age.  If CAC is >=100 or >=75th percentile, it is reasonable to initiate statin therapy at any age.  Cardiology referral should be considered for patients with CAC scores =400 or >=75th percentile.  *2018 AHA/ACC/AACVPR/AAPA/ABC/ACPM/ADA/AGS/APhA/ASPC/NLA/PCNA Guideline on the Management of Blood Cholesterol: A Report of the American College of Cardiology/American Heart Association Task Force on Clinical Practice Guidelines. J Am Coll Cardiol. 2019;73(24):3168-3209.  Riley Lam, MD  Electronically Signed: By: Riley Lam M.D. On: 08/01/2023 20:14              Recent Labs: 01/21/2023: TSH 2.11 06/06/2023: ALT 20 07/10/2023: Hemoglobin 12.2; Platelets 339.0 07/29/2023: BUN 16; Creatinine, Ser 1.15; Potassium 4.0; Sodium 140  Recent Lipid Panel    Component Value Date/Time   CHOL 164 12/25/2022 1047   TRIG 84 12/25/2022 1047   HDL 103 12/25/2022  1047   CHOLHDL 1.6 12/25/2022 1047   CHOLHDL 2.2 05/08/2022 0424   VLDL 24  05/08/2022 0424   LDLCALC 46 12/25/2022 1047    Physical Exam:    VS:  BP (!) 170/80   Pulse 76   Ht 5\' 2"  (1.575 m)   Wt 146 lb 3.2 oz (66.3 kg)   SpO2 96%   BMI 26.74 kg/m     Wt Readings from Last 3 Encounters:  09/10/23 146 lb 3.2 oz (66.3 kg)  08/07/23 146 lb 8 oz (66.5 kg)  07/29/23 144 lb (65.3 kg)     GEN:  Well nourished, well developed in no acute distress HEENT: Normal NECK: No JVD; No carotid bruits LYMPHATICS: No lymphadenopathy CARDIAC: RRR, no murmurs, rubs, gallops RESPIRATORY:  Clear to auscultation without rales, wheezing or rhonchi  ABDOMEN: Soft, non-tender, non-distended MUSCULOSKELETAL:  No edema; No deformity  SKIN: Warm and dry NEUROLOGIC:  Alert and oriented x 3 PSYCHIATRIC:  Normal affect    Signed, Norman Herrlich, MD  09/10/2023 2:46 PM    [facility]

## 2023-09-10 ENCOUNTER — Ambulatory Visit: Payer: Medicare Other | Admitting: Internal Medicine

## 2023-09-10 ENCOUNTER — Ambulatory Visit: Payer: Medicare Other | Attending: Cardiology | Admitting: Cardiology

## 2023-09-10 VITALS — BP 170/80 | HR 76 | Ht 62.0 in | Wt 146.2 lb

## 2023-09-10 DIAGNOSIS — I1A0 Resistant hypertension: Secondary | ICD-10-CM | POA: Diagnosis present

## 2023-09-10 MED ORDER — NEBIVOLOL HCL 5 MG PO TABS: 5.0000 mg | ORAL_TABLET | Freq: Every day | ORAL | 1 refills | Status: DC | PRN

## 2023-09-10 MED ORDER — SPIRONOLACTONE 25 MG PO TABS: 25.0000 mg | ORAL_TABLET | Freq: Every day | ORAL | 3 refills | Status: DC

## 2023-09-10 NOTE — Patient Instructions (Signed)
Medication Instructions:  Your physician has recommended you make the following change in your medication:   START: Bystolic 5 mg daily as needed for SBP > 150. START: Spirinolactone 25 mg daily STOP: Clonidine  *If you need a refill on your cardiac medications before your next appointment, please call your pharmacy*   Lab Work: Your physician recommends that you return for lab work in:   Labs in 1 week: BMP, Aldosterone - Renin ratio   If you have labs (blood work) drawn today and your tests are completely normal, you will receive your results only by: MyChart Message (if you have MyChart) OR A paper copy in the mail If you have any lab test that is abnormal or we need to change your treatment, we will call you to review the results.   Testing/Procedures: Your physician has requested that you have a renal artery duplex. During this test, an ultrasound is used to evaluate blood flow to the kidneys. Allow one hour for this exam. Do not eat after midnight the day before and avoid carbonated beverages. Take your medications as you usually do.   Follow-Up: At [facility], you and your health needs are our priority.  As part of our continuing mission to provide you with exceptional heart care, we have created designated Provider Care Teams.  These Care Teams include your primary Cardiologist (physician) and Advanced Practice Providers (APPs -  Physician Assistants and Nurse Practitioners) who all work together to provide you with the care you need, when you need it.  We recommend signing up for the patient portal called "MyChart".  Sign up information is provided on this After Visit Summary.  MyChart is used to connect with patients for Virtual Visits (Telemedicine).  Patients are able to view lab/test results, encounter notes, upcoming appointments, etc.  Non-urgent messages can be sent to your provider as well.   To learn more about what you can do with MyChart, go to  [email or web address].    Your next appointment:   1 month(s)  Provider:   Norman Herrlich, MD    Other Instructions Check blood pressures daily and send a list to the office in 2 weeks.

## 2023-09-11 ENCOUNTER — Encounter: Payer: Self-pay | Admitting: Cardiology

## 2023-09-18 ENCOUNTER — Encounter (HOSPITAL_COMMUNITY): Payer: Self-pay | Admitting: Family

## 2023-09-18 ENCOUNTER — Other Ambulatory Visit: Payer: Self-pay

## 2023-09-18 ENCOUNTER — Ambulatory Visit (HOSPITAL_BASED_OUTPATIENT_CLINIC_OR_DEPARTMENT_OTHER): Payer: Medicare Other | Admitting: Family

## 2023-09-18 VITALS — BP 126/70 | HR 57 | Ht 62.0 in | Wt 142.0 lb

## 2023-09-18 DIAGNOSIS — F3341 Major depressive disorder, recurrent, in partial remission: Secondary | ICD-10-CM

## 2023-09-18 DIAGNOSIS — F411 Generalized anxiety disorder: Secondary | ICD-10-CM | POA: Diagnosis not present

## 2023-09-18 LAB — LAB REPORT - SCANNED
Creatinine, POC: 165.6 mg/dL
EGFR: 41

## 2023-09-18 NOTE — Progress Notes (Signed)
BH MD/PA/NP OP Progress Note  09/18/2023 9:21 AM Debbie Hatfield  MRN:  [id number]  Chief Complaint: Rosey Bath stated " I feel like I am doing so much better, I am fixing myself."  Evaluation: Debbie Hatfield seen and evaluated the patient by this provider.  She presents for medication follow-up appointment.  Patient was to adjust how she has been taking her prescription medications.  She was to continue the Effexor 37.5 mg daily which she reports taking and tolerating well.  Patient to continue Remeron 15 mg nightly.  Denying any medication side effects.  She reports overall her mood has improved and stabilized since she has been consistent with medications.    Debbie Hatfield reports she had a recent follow-up with cardiology who advised her to make medication adjustments to her gabapentin, and multiple hypertension medications.  States she was excited that her blood pressure issues have resolved.  States she was unable to get her blood pressure under control until her most recent visit to her cardiologist.  She reports her overall health and  mood has stabilized.    Major depressive disorder recurrent Generalized anxiety disorder  Continue Effexor 37.5 mg daily Continue Remeron 15 mg nightly  Follow-up 3 months for medication management  States she has researched ways to help with her leg spasm as she states she does daily stretches and has been engaging in family activities.    No concerns for suicidal or homicidal ideations.  Reports typically struggling with seasonal affective mood disorder during this time of the year thorough February.  Stated that she has been helping her aunt who resides in [place], stated she is 80% disable but she came to visit for the Holidays. [name] reported she has been proactive and has made plans to stay with her family in [place] in February.   Debbie Hatfield reports a good appetite.  States she is resting well throughout the night.  Reports multiple coping skills to keep her self  preoccupied.  States she currently nursing baby kittens and has 20+ farrow cats throughout her yard that she cares for.  States that she is very thankful and enjoys the simple things in life.    HPI:  Visit Diagnosis:    ICD-10-CM   1. GAD (generalized anxiety disorder)  F41.1     2. Recurrent major depressive disorder, in partial remission (HCC)  F33.41       Past Psychiatric History:   Past Medical History:  Past Medical History:  Diagnosis Date   Closed fracture of right distal radius    Gastroparesis    GERD (gastroesophageal reflux disease)    Hypertension    Irritable bowel disease    Lupus    Major depression    Stroke [facility])    Trigeminal neuralgia     Past Surgical History:  Procedure Laterality Date   BUBBLE STUDY  07/05/2022   Procedure: BUBBLE STUDY;  Surgeon: Parke Poisson, MD;  Location: [facility] ENDOSCOPY;  Service: Cardiology;;   CARPAL TUNNEL RELEASE Left 05/15/2023   CHOLECYSTECTOMY     COLONOSCOPY     LOOP RECORDER IMPLANT  2021   ORIF WRIST FRACTURE Right 09/22/2020   Procedure: OPEN REDUCTION INTERNAL FIXATION (ORIF) WRIST FRACTURE;  Surgeon: Ernest Mallick, MD;  Location: [facility];  Service: Orthopedics;  Laterality: Right;    TEE WITHOUT CARDIOVERSION N/A 07/05/2022   Procedure: TRANSESOPHAGEAL ECHOCARDIOGRAM (TEE);  Surgeon: Parke Poisson, MD;  Location: [facility] ENDOSCOPY;  Service: Cardiology;  Laterality: N/A;  Family Psychiatric History: see HPI   Family History:  Family History  Problem Relation Age of Onset   Heart block Mother        Pacemaker   Hypertension Mother    Depression Mother    Gout Mother    Macular degeneration Mother    Aneurysm Mother    Alcoholism Father    Congestive Heart Failure Maternal Grandmother    Diabetes Maternal Grandmother    Congestive Heart Failure Maternal Grandfather    Colon cancer Maternal Grandfather    Stroke Paternal Grandfather    Hypertension Paternal  Grandfather    Esophageal cancer Neg Hx    Stomach cancer Neg Hx    Rectal cancer Neg Hx     Social History:  Social History   Socioeconomic History   Marital status: Widowed    Spouse name: Not on file   Number of children: 0   Years of education: Not on file   Highest education level: Bachelor's degree (e.g., BA, AB, BS)  Occupational History    Comment: RN ortho/neuro at [employer]  Tobacco Use   Smoking status: Never    Passive exposure: Never   Smokeless tobacco: Never  Vaping Use   Vaping status: Never Used  Substance and Sexual Activity   Alcohol use: Yes    Comment: social   Drug use: Never   Sexual activity: Not Currently    Birth control/protection: Post-menopausal  Other Topics Concern   Not on file  Social History Narrative   06/13/22 living with her mother   2 [place] Dew per Week   Social Determinants of Health   Financial Resource Strain: Medium Risk (07/27/2023)   Overall Financial Resource Strain (CARDIA)    Difficulty of Paying Living Expenses: Somewhat hard  Food Insecurity: No Food Insecurity (07/27/2023)   Hunger Vital Sign    Worried About Running Out of Food in the Last Year: Never true    Ran Out of Food in the Last Year: Never true  Transportation Needs: No Transportation Needs (07/27/2023)   PRAPARE - Administrator, Civil Service (Medical): No    Lack of Transportation (Non-Medical): No  Physical Activity: Sufficiently Active (07/27/2023)   Exercise Vital Sign    Days of Exercise per Week: 4 days    Minutes of Exercise per Session: 40 min  Stress: Stress Concern Present (07/27/2023)   [employer] of Occupational Health - Occupational Stress Questionnaire    Feeling of Stress : Very much  Social Connections: Moderately Isolated (07/27/2023)   Social Connection and Isolation Panel [NHANES]    Frequency of Communication with Friends and Family: Three times a week    Frequency of Social Gatherings with Friends and Family:  Twice a week    Attends Religious Services: Never    Database administrator or Organizations: No    Attends Engineer, structural: More than 4 times per year    Marital Status: Widowed    Allergies:  Allergies  Allergen Reactions   Plaquenil [Hydroxychloroquine]     Retinal damage   Sulfa Antibiotics Nausea And Vomiting   Sulfamethoxazole-Trimethoprim Other (See Comments)   Penicillins Rash    Metabolic Disorder Labs: Lab Results  Component Value Date   HGBA1C 5.7 (H) 05/08/2022   MPG 116.89 05/08/2022   No results found for: "PROLACTIN" Lab Results  Component Value Date   CHOL 164 12/25/2022   TRIG 84 12/25/2022   HDL 103 12/25/2022   CHOLHDL 1.6  12/25/2022   VLDL 24 05/08/2022   LDLCALC 46 12/25/2022   LDLCALC 105 (H) 05/08/2022   Lab Results  Component Value Date   TSH 2.11 01/21/2023   TSH 1.45 11/15/2021    Therapeutic Level Labs: No results found for: "LITHIUM" No results found for: "VALPROATE" Lab Results  Component Value Date   CBMZ <2.0 (L) 07/29/2023    Current Medications: Current Outpatient Medications  Medication Sig Dispense Refill   acetaminophen (TYLENOL) 325 MG tablet Take 650 mg by mouth as needed for mild pain (pain score 1-3) or moderate pain (pain score 4-6).     acyclovir (ZOVIRAX) 400 MG tablet TAKE 1 TABLET(400 MG) BY MOUTH DAILY 90 tablet 1   baclofen (LIORESAL) 10 MG tablet Take 10 mg by mouth 3 (three) times daily as needed for muscle spasms (As tolerated).     clopidogrel (PLAVIX) 75 MG tablet One po qd 90 tablet 4   diclofenac Sodium (VOLTAREN) 1 % GEL Apply 2 g topically 4 (four) times daily.     gabapentin (NEURONTIN) 300 MG capsule Take 600 mg by mouth 2 (two) times daily.     pantoprazole (PROTONIX) 40 MG tablet Take 1 tablet (40 mg total) by mouth 2 (two) times daily. 90 tablet 3   rosuvastatin (CRESTOR) 20 MG tablet Take 1 tablet (20 mg total) by mouth daily. 90 tablet 0   telmisartan-hydrochlorothiazide (MICARDIS  HCT) 40-12.5 MG tablet Take 1 tablet by mouth daily. 90 tablet 0   tizanidine (ZANAFLEX) 2 MG capsule Take 4 mg by mouth at bedtime.     venlafaxine XR (EFFEXOR-XR) 37.5 MG 24 hr capsule Take 1 capsule (37.5 mg total) by mouth daily with breakfast. (Patient taking differently: Take 37.5 mg by mouth 2 (two) times daily.) 180 capsule 0   carbamazepine (CARBATROL) 300 MG 12 hr capsule TAKE 1 CAPSULE(300 MG) BY MOUTH AT BEDTIME (Patient not taking: Reported on 09/18/2023) 90 capsule 0   hydrOXYzine (ATARAX) 10 MG tablet Take 1 tablet (10 mg total) by mouth every 8 (eight) hours as needed for anxiety. (Patient not taking: Reported on 09/18/2023) 270 tablet 0   meclizine (ANTIVERT) 25 MG tablet Take 25 mg by mouth daily as needed for dizziness. (Patient not taking: Reported on 09/18/2023)     nebivolol (BYSTOLIC) 5 MG tablet Take 1 tablet (5 mg total) by mouth daily as needed (if SBP > 150.). (Patient not taking: Reported on 09/18/2023) 90 tablet 1   spironolactone (ALDACTONE) 25 MG tablet Take 1 tablet (25 mg total) by mouth daily. (Patient not taking: Reported on 09/18/2023) 90 tablet 3   sucralfate (CARAFATE) 1 GM/10ML suspension Take 1 g by mouth 3 (three) times daily as needed (Ulcers). (Patient not taking: Reported on 09/18/2023)     No current facility-administered medications for this visit.     Musculoskeletal: Strength & Muscle Tone: within normal limits Gait & Station: normal Patient leans: N/A  Psychiatric Specialty Exam: Review of Systems  Psychiatric/Behavioral:  Positive for decreased concentration. Sleep disturbance: improving.The patient is nervous/anxious.   All other systems reviewed and are negative.   Blood pressure 126/70, pulse (!) 57, height 5\' 2"  (1.575 m), weight 142 lb (64.4 kg).Body mass index is 25.97 kg/m.  General Appearance: Casual  Eye Contact:  Good  Speech:  Clear and Coherent  Volume:  Normal  Mood:  Anxious and Depressed  Affect:  Congruent  Thought Process:   Coherent  Orientation:  Full (Time, Place, and Person)  Thought Content: Illogical  Suicidal Thoughts:  No  Homicidal Thoughts:  No  Memory:  Immediate;   Good Recent;   Good  Judgement:  Good  Insight:  Good  Psychomotor Activity:  Normal  Concentration:  Concentration: Good  Recall:  Good  Fund of Knowledge: Good  Language: Good  Akathisia:  No  Handed:  Right  AIMS (if indicated): not done  Assets:  Communication Skills Desire for Improvement Resilience Social Support  ADL's:  Intact  Cognition: WNL  Sleep:  Good   Screenings: GAD-7    Flowsheet Row Office Visit from 11/15/2021 in [facility] [place]  Total GAD-7 Score 2      PHQ2-9    Flowsheet Row Office Visit from [facility]/28/2023 in [facility] Clinical Support from 10/24/2022 in [facility] [place] Office Visit from 10/22/2022 in [facility] [place] Office Visit from 05/10/2022 in [facility] Office Visit from 03/26/2022 in [facility]  PHQ-2 Total Score 5 0 0 4 1  PHQ-9 Total Score 12 8 8 16 6       Flowsheet Row ED to Hosp-Admission (Discharged) from 05/07/2022 [facility] NEURO/TRAUMA/SURGICAL ICU ED from 01/07/2021 in [facility] at [facility] ED from 12/29/2020 in [facility] Department at [facility]  C-SSRS RISK CATEGORY No Risk No Risk No Risk       Assessment and Plan: Derya Poyner 67 year old Caucasian female presents for medication management follow-up appointment.  Discussed stabilizing previous medication regimen as patient was taking medications sporadically.  States she was taking Effexor 37.5mg  to 100 mg  every 2 to 3 days due to the side effects.    -Currently she is prescribed Effexor 37.5 mg daily and Remeron 15 mg nightly.  She reports she has been taking and tolerating  medications well.  Reports her mood has stabilized since she has started this new medication regimen.  Reports she recently followed up with her cardiologist who discontinued multiple medications related to hypertension and peripheral neuropathy.  She reports she was told that she" had too many medications to affect her central nervous system."  [place] states her physical and mental health has improved since our initial visit.  She is denying suicidal or homicidal ideations denies auditory or visual hallucinations.  Discussed continuing with medication dose as she is tolerating and taking as directed.  Patient to follow-up 3 months for medication management appointment  Major depressive disorder recurrent: Generalized anxiety disorder:  Continue Effexor 37.5 mg daily Continue Remeron 15 mg nightly  Encouraged to continue coping mechanisms and physical therapy Consideration for therapist  Follow-up 3 months for medication management  Collaboration of Care: Collaboration of Care: Medication Management AEB continue Effexor 37.5 and Remeron 15 mg nightly  Patient/Guardian was advised Release of Information must be obtained prior to any record release in order to collaborate their care with an outside provider. Patient/Guardian was advised if they have not already done so to contact the registration department to sign all necessary forms in order for [place] to release information regarding their care.   Consent: Patient/Guardian gives verbal consent for treatment and assignment of benefits for services provided during this visit. Patient/Guardian expressed understanding and agreed to proceed.    Oneta Rack, NP 09/18/2023, 9:21 AM

## 2023-09-19 ENCOUNTER — Other Ambulatory Visit: Payer: Self-pay | Admitting: Nephrology

## 2023-09-19 DIAGNOSIS — N1831 Chronic kidney disease, stage 3a: Secondary | ICD-10-CM

## 2023-09-20 ENCOUNTER — Encounter: Payer: Self-pay | Admitting: Cardiology

## 2023-09-20 ENCOUNTER — Ambulatory Visit (INDEPENDENT_AMBULATORY_CARE_PROVIDER_SITE_OTHER): Payer: Medicare Other

## 2023-09-20 DIAGNOSIS — R55 Syncope and collapse: Secondary | ICD-10-CM | POA: Diagnosis not present

## 2023-09-20 LAB — CUP PACEART REMOTE DEVICE CHECK
Date Time Interrogation Session: 20241206053934
Implantable Pulse Generator Implant Date: 20220728

## 2023-09-23 ENCOUNTER — Other Ambulatory Visit: Payer: Medicare Other

## 2023-09-23 ENCOUNTER — Other Ambulatory Visit: Payer: Self-pay | Admitting: *Deleted

## 2023-09-23 DIAGNOSIS — M329 Systemic lupus erythematosus, unspecified: Secondary | ICD-10-CM

## 2023-09-23 DIAGNOSIS — M3219 Other organ or system involvement in systemic lupus erythematosus: Secondary | ICD-10-CM

## 2023-09-23 DIAGNOSIS — R760 Raised antibody titer: Secondary | ICD-10-CM

## 2023-09-23 DIAGNOSIS — Z79899 Other long term (current) drug therapy: Secondary | ICD-10-CM

## 2023-09-23 NOTE — Progress Notes (Signed)
Carelink Summary Report / Loop Recorder 

## 2023-09-26 NOTE — Progress Notes (Signed)
Hemoglobin is borderline low-11.6.  MCHC borderline low. Rest of CBC WNL. Creatinine is elevated-1.30 and GFR is low-45. Please notify the patient and advise the patient to avoid the use of NSAIDs.  Please clarify if she has been taking any NSAIDs or if she has had any other medication changes?    Urine protein creatinine ratio WNL  Complements WNL  ESR WNL  dsDNA is negative. ANA is negative.

## 2023-09-27 LAB — COMPLETE METABOLIC PANEL WITH GFR
AG Ratio: 1.7 (calc) (ref 1.0–2.5)
ALT: 15 U/L (ref 6–29)
AST: 27 U/L (ref 10–35)
Albumin: 4.5 g/dL (ref 3.6–5.1)
Alkaline phosphatase (APISO): 90 U/L (ref 37–153)
BUN/Creatinine Ratio: 15 (calc) (ref 6–22)
BUN: 20 mg/dL (ref 7–25)
CO2: 24 mmol/L (ref 20–32)
Calcium: 9.7 mg/dL (ref 8.6–10.4)
Chloride: 106 mmol/L (ref 98–110)
Creat: 1.3 mg/dL — ABNORMAL HIGH (ref 0.50–1.05)
Globulin: 2.7 g/dL (ref 1.9–3.7)
Glucose, Bld: 107 mg/dL — ABNORMAL HIGH (ref 65–99)
Potassium: 4.6 mmol/L (ref 3.5–5.3)
Sodium: 141 mmol/L (ref 135–146)
Total Bilirubin: 0.4 mg/dL (ref 0.2–1.2)
Total Protein: 7.2 g/dL (ref 6.1–8.1)
eGFR: 45 mL/min/{1.73_m2} — ABNORMAL LOW (ref >=60)

## 2023-09-27 LAB — PROTEIN / CREATININE RATIO, URINE
Creatinine, Urine: 134 mg/dL (ref 20–275)
Protein/Creat Ratio: 149 mg/g{creat} (ref 24–184)
Protein/Creatinine Ratio: 0.149 mg/mg{creat} (ref 0.024–0.184)
Total Protein, Urine: 20 mg/dL (ref 5–24)

## 2023-09-27 LAB — BETA-2 GLYCOPROTEIN ANTIBODIES
Beta-2 Glyco 1 IgA: <2 U/mL (ref <20.0)
Beta-2 Glyco 1 IgM: <2 U/mL (ref <20.0)
Beta-2 Glyco I IgG: <2 U/mL (ref <20.0)

## 2023-09-27 LAB — CARDIOLIPIN ANTIBODIES, IGG, IGM, IGA
Anticardiolipin IgA: <2 [APL'U]/mL (ref <20.0)
Anticardiolipin IgG: <2 [GPL'U]/mL (ref <20.0)
Anticardiolipin IgM: <2 [MPL'U]/mL (ref <20.0)

## 2023-09-27 LAB — CBC WITH DIFFERENTIAL/PLATELET
Absolute Lymphocytes: 1610 {cells}/uL (ref 850–3900)
Absolute Monocytes: 532 {cells}/uL (ref 200–950)
Basophils Absolute: 31 {cells}/uL (ref 0–200)
Basophils Relative: 0.7 %
Eosinophils Absolute: 70 {cells}/uL (ref 15–500)
Eosinophils Relative: 1.6 %
HCT: 36.5 % (ref 35.0–45.0)
Hemoglobin: 11.6 g/dL — ABNORMAL LOW (ref 11.7–15.5)
MCH: 29.4 pg (ref 27.0–33.0)
MCHC: 31.8 g/dL — ABNORMAL LOW (ref 32.0–36.0)
MCV: 92.4 fL (ref 80.0–100.0)
MPV: 10.5 fL (ref 7.5–12.5)
Monocytes Relative: 12.1 %
Neutro Abs: 2156 {cells}/uL (ref 1500–7800)
Neutrophils Relative %: 49 %
Platelets: 304 10*3/uL (ref 140–400)
RBC: 3.95 10*6/uL (ref 3.80–5.10)
RDW: 12.3 % (ref 11.0–15.0)
Total Lymphocyte: 36.6 %
WBC: 4.4 10*3/uL (ref 3.8–10.8)

## 2023-09-27 LAB — ANTI-DNA ANTIBODY, DOUBLE-STRANDED: ds DNA Ab: <1 [IU]/mL

## 2023-09-27 LAB — C3 AND C4
C3 Complement: 140 mg/dL (ref 83–193)
C4 Complement: 22 mg/dL (ref 15–57)

## 2023-09-27 LAB — SEDIMENTATION RATE: Sed Rate: 17 mm/h (ref 0–30)

## 2023-09-27 LAB — ANA: Anti Nuclear Antibody (ANA): NEGATIVE

## 2023-09-30 ENCOUNTER — Other Ambulatory Visit: Payer: Medicare Other

## 2023-09-30 ENCOUNTER — Ambulatory Visit: Payer: Medicare Other

## 2023-09-30 ENCOUNTER — Ambulatory Visit: Payer: Medicare Other | Admitting: Internal Medicine

## 2023-09-30 NOTE — Progress Notes (Signed)
Anticardiolipin antibodies and beta-2 glycoprotein antibodies negative.

## 2023-10-02 ENCOUNTER — Observation Stay (HOSPITAL_COMMUNITY)
Admission: EM | Admit: 2023-10-02 | Discharge: 2023-10-03 | Disposition: A | Discharge status: Home / Self Care | Payer: Medicare Other | Attending: Internal Medicine | Admitting: Internal Medicine

## 2023-10-02 ENCOUNTER — Other Ambulatory Visit: Payer: Self-pay

## 2023-10-02 ENCOUNTER — Emergency Department (HOSPITAL_COMMUNITY): Payer: Medicare Other

## 2023-10-02 ENCOUNTER — Ambulatory Visit
Admission: RE | Admit: 2023-10-02 | Discharge: 2023-10-02 | Disposition: A | Discharge status: Home / Self Care | Payer: Medicare Other | Source: Ambulatory Visit | Attending: Nephrology | Admitting: Nephrology

## 2023-10-02 DIAGNOSIS — I495 Sick sinus syndrome: Secondary | ICD-10-CM | POA: Diagnosis not present

## 2023-10-02 DIAGNOSIS — N1831 Chronic kidney disease, stage 3a: Secondary | ICD-10-CM | POA: Insufficient documentation

## 2023-10-02 DIAGNOSIS — I2510 Atherosclerotic heart disease of native coronary artery without angina pectoris: Secondary | ICD-10-CM | POA: Diagnosis not present

## 2023-10-02 DIAGNOSIS — N179 Acute kidney failure, unspecified: Secondary | ICD-10-CM | POA: Diagnosis not present

## 2023-10-02 DIAGNOSIS — I959 Hypotension, unspecified: Secondary | ICD-10-CM | POA: Diagnosis not present

## 2023-10-02 DIAGNOSIS — F418 Other specified anxiety disorders: Secondary | ICD-10-CM | POA: Diagnosis present

## 2023-10-02 DIAGNOSIS — G629 Polyneuropathy, unspecified: Secondary | ICD-10-CM | POA: Insufficient documentation

## 2023-10-02 DIAGNOSIS — R55 Syncope and collapse: Principal | ICD-10-CM | POA: Diagnosis present

## 2023-10-02 DIAGNOSIS — R001 Bradycardia, unspecified: Secondary | ICD-10-CM

## 2023-10-02 DIAGNOSIS — E785 Hyperlipidemia, unspecified: Secondary | ICD-10-CM | POA: Diagnosis present

## 2023-10-02 DIAGNOSIS — Z8673 Personal history of transient ischemic attack (TIA), and cerebral infarction without residual deficits: Secondary | ICD-10-CM | POA: Diagnosis not present

## 2023-10-02 DIAGNOSIS — I129 Hypertensive chronic kidney disease with stage 1 through stage 4 chronic kidney disease, or unspecified chronic kidney disease: Secondary | ICD-10-CM | POA: Diagnosis not present

## 2023-10-02 DIAGNOSIS — Z950 Presence of cardiac pacemaker: Secondary | ICD-10-CM | POA: Insufficient documentation

## 2023-10-02 DIAGNOSIS — Z79899 Other long term (current) drug therapy: Secondary | ICD-10-CM | POA: Diagnosis not present

## 2023-10-02 DIAGNOSIS — R0602 Shortness of breath: Secondary | ICD-10-CM | POA: Insufficient documentation

## 2023-10-02 DIAGNOSIS — Z7902 Long term (current) use of antithrombotics/antiplatelets: Secondary | ICD-10-CM | POA: Diagnosis not present

## 2023-10-02 LAB — CBC WITH DIFFERENTIAL/PLATELET
Abs Immature Granulocytes: 0.02 10*3/uL (ref 0.00–0.07)
Basophils Absolute: 0 10*3/uL (ref 0.0–0.1)
Basophils Relative: 0 %
Eosinophils Absolute: 0.1 10*3/uL (ref 0.0–0.5)
Eosinophils Relative: 1 %
HCT: 32.8 % — ABNORMAL LOW (ref 36.0–46.0)
Hemoglobin: 10.2 g/dL — ABNORMAL LOW (ref 12.0–15.0)
Immature Granulocytes: 0 %
Lymphocytes Relative: 38 %
Lymphs Abs: 3.3 10*3/uL (ref 0.7–4.0)
MCH: 29.2 pg (ref 26.0–34.0)
MCHC: 31.1 g/dL (ref 30.0–36.0)
MCV: 94 fL (ref 80.0–100.0)
Monocytes Absolute: 0.9 10*3/uL (ref 0.1–1.0)
Monocytes Relative: 10 %
Neutro Abs: 4.3 10*3/uL (ref 1.7–7.7)
Neutrophils Relative %: 51 %
Platelets: 254 10*3/uL (ref 150–400)
RBC: 3.49 MIL/uL — ABNORMAL LOW (ref 3.87–5.11)
RDW: 13.2 % (ref 11.5–15.5)
WBC: 8.6 10*3/uL (ref 4.0–10.5)
nRBC: 0 % (ref 0.0–0.2)

## 2023-10-02 LAB — URINALYSIS, W/ REFLEX TO CULTURE (INFECTION SUSPECTED)
Bilirubin Urine: NEGATIVE
Glucose, UA: NEGATIVE mg/dL
Hgb urine dipstick: NEGATIVE
Ketones, ur: NEGATIVE mg/dL
Leukocytes,Ua: NEGATIVE
Nitrite: NEGATIVE
Protein, ur: NEGATIVE mg/dL
Specific Gravity, Urine: 1.01 (ref 1.005–1.030)
pH: 5 (ref 5.0–8.0)

## 2023-10-02 LAB — COMPREHENSIVE METABOLIC PANEL
ALT: 16 U/L (ref 0–44)
AST: 26 U/L (ref 15–41)
Albumin: 3.6 g/dL (ref 3.5–5.0)
Alkaline Phosphatase: 69 U/L (ref 38–126)
Anion gap: 10 (ref 5–15)
BUN: 25 mg/dL — ABNORMAL HIGH (ref 8–23)
CO2: 22 mmol/L (ref 22–32)
Calcium: 8.7 mg/dL — ABNORMAL LOW (ref 8.9–10.3)
Chloride: 106 mmol/L (ref 98–111)
Creatinine, Ser: 1.73 mg/dL — ABNORMAL HIGH (ref 0.44–1.00)
GFR, Estimated: 32 mL/min — ABNORMAL LOW (ref >60)
Glucose, Bld: 118 mg/dL — ABNORMAL HIGH (ref 70–99)
Potassium: 3.6 mmol/L (ref 3.5–5.1)
Sodium: 138 mmol/L (ref 135–145)
Total Bilirubin: 0.5 mg/dL (ref <1.2)
Total Protein: 6.2 g/dL — ABNORMAL LOW (ref 6.5–8.1)

## 2023-10-02 LAB — TSH: TSH: 1.886 u[IU]/mL (ref 0.350–4.500)

## 2023-10-02 LAB — TROPONIN I (HIGH SENSITIVITY)
Troponin I (High Sensitivity): 6 ng/L (ref <18)
Troponin I (High Sensitivity): 7 ng/L (ref <18)

## 2023-10-02 LAB — BRAIN NATRIURETIC PEPTIDE: B Natriuretic Peptide: 69.5 pg/mL (ref 0.0–100.0)

## 2023-10-02 LAB — MAGNESIUM: Magnesium: 2 mg/dL (ref 1.7–2.4)

## 2023-10-02 MED ORDER — EPINEPHRINE HCL 5 MG/250ML IV SOLN IN NS
0.5000 ug/min | INTRAVENOUS | Status: DC
  Administered 2023-10-02: 0.5 ug/min via INTRAVENOUS
  Filled 2023-10-02: qty 250

## 2023-10-02 MED ORDER — SODIUM CHLORIDE 0.9 % IV BOLUS
500.0000 mL | Freq: Once | INTRAVENOUS | Status: AC
  Administered 2023-10-02: 500 mL via INTRAVENOUS

## 2023-10-02 NOTE — ED Provider Notes (Signed)
[facility] AT [facility] Provider Note   CSN: [id number] Arrival date & time: 10/02/23  7062     History  Chief Complaint  Patient presents with   Hypotension    Debbie Hatfield is a 67 y.o. female.  The history is provided by the patient and medical records. No language interpreter was used.  Near Syncope This is a recurrent problem. The current episode started 6 to 12 hours ago. The problem has not changed since onset.Associated symptoms include shortness of breath (mild with it). Pertinent negatives include no chest pain, no abdominal pain and no headaches. Nothing aggravates the symptoms. Nothing relieves the symptoms. She has tried nothing for the symptoms. The treatment provided no relief.       Home Medications Prior to Admission medications   Medication Sig Start Date End Date Taking? Authorizing Provider  acetaminophen (TYLENOL) 325 MG tablet Take 650 mg by mouth as needed for mild pain (pain score 1-3) or moderate pain (pain score 4-6).    [provider]  acyclovir (ZOVIRAX) 400 MG tablet TAKE 1 TABLET(400 MG) BY MOUTH DAILY 06/22/23   Etta Grandchild, MD  baclofen (LIORESAL) 10 MG tablet Take 10 mg by mouth 3 (three) times daily as needed for muscle spasms (As tolerated).    [provider]  carbamazepine (CARBATROL) 300 MG 12 hr capsule TAKE 1 CAPSULE(300 MG) BY MOUTH AT BEDTIME Patient not taking: Reported on 09/18/2023 08/14/23   Etta Grandchild, MD  clopidogrel (PLAVIX) 75 MG tablet One po qd 08/07/23   [place], Pearletha Furl, MD  diclofenac Sodium (VOLTAREN) 1 % GEL Apply 2 g topically 4 (four) times daily. 11/09/22   [provider]  gabapentin (NEURONTIN) 300 MG capsule Take 600 mg by mouth 2 (two) times daily.    [provider]  hydrOXYzine (ATARAX) 10 MG tablet Take 1 tablet (10 mg total) by mouth every 8 (eight) hours as needed for anxiety. Patient not taking: Reported on 09/18/2023 10/23/22   Etta Grandchild, MD  meclizine (ANTIVERT) 25 MG tablet Take 25 mg by mouth daily as needed for dizziness. Patient not taking: Reported on 09/18/2023    [provider]  nebivolol (BYSTOLIC) 5 MG tablet Take 1 tablet (5 mg total) by mouth daily as needed (if SBP > 150.). Patient not taking: Reported on 09/18/2023 09/10/23   Baldo Daub, MD  pantoprazole (PROTONIX) 40 MG tablet Take 1 tablet (40 mg total) by mouth 2 (two) times daily. 05/16/23   McMichael, Saddie Benders, PA-C  rosuvastatin (CRESTOR) 20 MG tablet Take 1 tablet (20 mg total) by mouth daily. 07/29/23   Etta Grandchild, MD  spironolactone (ALDACTONE) 25 MG tablet Take 1 tablet (25 mg total) by mouth daily. Patient not taking: Reported on 09/18/2023 09/10/23   Baldo Daub, MD  sucralfate (CARAFATE) 1 GM/10ML suspension Take 1 g by mouth 3 (three) times daily as needed (Ulcers). Patient not taking: Reported on 09/18/2023    [provider]  telmisartan-hydrochlorothiazide (MICARDIS HCT) 40-12.5 MG tablet Take 1 tablet by mouth daily. 07/29/23   Etta Grandchild, MD  tizanidine (ZANAFLEX) 2 MG capsule Take 4 mg by mouth at bedtime. 07/03/22   [provider]  venlafaxine XR (EFFEXOR-XR) 37.5 MG 24 hr capsule Take 1 capsule (37.5 mg total) by mouth daily with breakfast. Patient taking differently: Take 37.5 mg by mouth 2 (two) times daily. 07/29/23   Etta Grandchild, MD  Allergies    Plaquenil [hydroxychloroquine], Sulfa antibiotics, Sulfamethoxazole-trimethoprim, and Penicillins    Review of Systems   Review of Systems  Constitutional:  Positive for fatigue. Negative for chills and fever.  HENT:  Negative for congestion.   Eyes:  Negative for visual disturbance.  Respiratory:  Positive for shortness of breath (mild with it). Negative for cough, chest tightness and wheezing.   Cardiovascular:  Positive for near-syncope. Negative for chest pain, palpitations and leg swelling.  Gastrointestinal:  Positive for  nausea. Negative for abdominal pain, constipation, diarrhea and vomiting.  Genitourinary:  Negative for dysuria and frequency.  Musculoskeletal:  Negative for back pain, neck pain and neck stiffness.  Skin:  Negative for rash and wound.  Neurological:  Positive for light-headedness. Negative for dizziness, syncope and headaches.  Psychiatric/Behavioral:  Negative for agitation and confusion.   All other systems reviewed and are negative.   Physical Exam Updated Vital Signs BP (!) 184/129   Pulse 94   Temp 97.6 F (36.4 C) (Oral)   Resp (!) 21   Ht 5\' 2"  (1.575 m)   Wt 63.5 kg   SpO2 100%   BMI 25.61 kg/m   Physical Exam Vitals and nursing note reviewed.  Constitutional:      General: She is not in acute distress.    Appearance: She is well-developed. She is not ill-appearing, toxic-appearing or diaphoretic.  HENT:     Head: Normocephalic and atraumatic.     Mouth/Throat:     Mouth: Mucous membranes are dry.     Pharynx: No oropharyngeal exudate or posterior oropharyngeal erythema.  Eyes:     Extraocular Movements: Extraocular movements intact.     Conjunctiva/sclera: Conjunctivae normal.     Pupils: Pupils are equal, round, and reactive to light.  Cardiovascular:     Rate and Rhythm: Regular rhythm. Bradycardia present.     Heart sounds: No murmur heard. Pulmonary:     Effort: Pulmonary effort is normal. No respiratory distress.     Breath sounds: Normal breath sounds. No wheezing, rhonchi or rales.  Chest:     Chest wall: No tenderness.  Abdominal:     General: Abdomen is flat.     Palpations: Abdomen is soft.     Tenderness: There is no abdominal tenderness. There is no guarding or rebound.  Musculoskeletal:        General: No swelling or tenderness.     Cervical back: Neck supple.     Right lower leg: No edema.     Left lower leg: No edema.  Skin:    General: Skin is warm and dry.     Capillary Refill: Capillary refill takes less than 2 seconds.      Findings: No erythema.  Neurological:     General: No focal deficit present.     Mental Status: She is alert.  Psychiatric:        Mood and Affect: Mood normal.     ED Results / Procedures / Treatments   Labs (all labs ordered are listed, but only abnormal results are displayed) Labs Reviewed  CBC WITH DIFFERENTIAL/PLATELET - Abnormal; Notable for the following components:      Result Value   RBC 3.49 (*)    Hemoglobin 10.2 (*)    HCT 32.8 (*)    All other components within normal limits  COMPREHENSIVE METABOLIC PANEL - Abnormal; Notable for the following components:   Glucose, Bld 118 (*)    BUN 25 (*)    Creatinine,  Ser 1.73 (*)    Calcium 8.7 (*)    Total Protein 6.2 (*)    GFR, Estimated 32 (*)    All other components within normal limits  URINALYSIS, W/ REFLEX TO CULTURE (INFECTION SUSPECTED) - Abnormal; Notable for the following components:   Bacteria, UA RARE (*)    All other components within normal limits  MAGNESIUM  BRAIN NATRIURETIC PEPTIDE  TSH  TROPONIN I (HIGH SENSITIVITY)  TROPONIN I (HIGH SENSITIVITY)    EKG None  Radiology DG Chest Portable 1 View Result Date: 10/02/2023 CLINICAL DATA:  Hypotension. EXAM: PORTABLE CHEST 1 VIEW COMPARISON:  Chest radiograph dated July 10, 2023. FINDINGS: The heart size and mediastinal contours are within normal limits. Loop recorder device projecting over the left chest wall. Both lungs are clear. No sizable pleural effusion or pneumothorax. No acute osseous abnormality. IMPRESSION: No acute cardiopulmonary findings. Electronically Signed   By: Hart Robinsons M.D.   On: 10/02/2023 21:01   US RENAL ARTERY DUPLEX COMPLETE Result Date: 10/02/2023 CLINICAL DATA:  Stage III A chronic kidney disease Hypertension EXAM: RENAL/URINARY TRACT ULTRASOUND RENAL DUPLEX DOPPLER ULTRASOUND COMPARISON:  None available FINDINGS: Right Kidney: Length: 9.1 cm. Echogenicity within normal limits. No mass or hydronephrosis visualized.  Left Kidney: Length: 10.6 cm. Echogenicity within normal limits. No mass or hydronephrosis visualized. Bladder: Evaluation limited by underdistention. Grossly unremarkable. RENAL DUPLEX ULTRASOUND Right Renal Artery Velocities: Origin:  97 cm/sec Mid:  86 cm/sec Hilum:  92 cm/sec Interlobar:  85 cm/sec Arcuate:  40 cm/sec Left Renal Artery Velocities: Origin:  81 cm/sec Mid:  90 cm/sec Hilum:  78 cm/sec Interlobar:  62 cm/sec Arcuate:  52 cm/sec Aortic Velocity:  100 cm/sec Right Renal-Aortic Ratios: Origin: 1.0 Mid:  0.9 Hilum: 0.9 Interlobar: 0.9 Arcuate: 0.4 Left Renal-Aortic Ratios: Origin: 0.8 Mid: 0.9 Hilum: 0.8 Interlobar: 0.6 Arcuate: 0.5 IMPRESSION: 1. No significant grayscale abnormality of the kidneys. 2. No Doppler evidence of significant renal artery stenosis. Electronically Signed   By: Acquanetta Belling M.D.   On: 10/02/2023 10:02    Procedures Procedures    CRITICAL CARE Performed by: Canary Brim Mariabelen Pressly Total critical care time: 35 minutes Critical care time was exclusive of separately billable procedures and treating other patients. Critical care was necessary to treat or prevent imminent or life-threatening deterioration. Critical care was time spent personally by me on the following activities: development of treatment plan with patient and/or surrogate as well as nursing, discussions with consultants, evaluation of patient's response to treatment, examination of patient, obtaining history from patient or surrogate, ordering and performing treatments and interventions, ordering and review of laboratory studies, ordering and review of radiographic studies, pulse oximetry and re-evaluation of patient's condition.   Medications Ordered in ED Medications  EPINEPHrine (ADRENALIN) 5 mg in NS 250 mL (0.02 mg/mL) premix infusion (0 mcg/min Intravenous Paused 10/02/23 2249)  sodium chloride 0.9 % bolus 500 mL (0 mLs Intravenous Stopped 10/02/23 2111)    ED Course/ Medical Decision  Making/ A&P                                 Medical Decision Making Amount and/or Complexity of Data Reviewed Labs: ordered. Radiology: ordered.  Risk Prescription drug management.    Debbie Hatfield is a 67 y.o. female with a past medical history significant for GERD, lupus, anxiety, CKD, hyperlipidemia, previous stroke, gastroparesis, and peptic ulcer disease who presents with fatigue, malaise, nausea, bradycardia, and  near syncope.  According to patient, for the last month or so she has been having difficulty with her blood pressure and heart rate.  She says that every person in her family has pacemakers.  She said that over the last week it has been worsened and today it lasted for more than an hour.  She reports that her heart rate was in the low 40s and blood pressure was in the 70s and she felt she was get a pass out.  She reported getting clammy and near syncopal.  She reports her vision started to go out.  She reports she had some difficulty with her speech and let facial numbness which is what she has had with previous stroke.  She said that she called EMS who gave her some fluids that did not seem to help.  She then was started on epi drip and her blood pressure and heart rate improved and her neurologic symptoms improved drastically.  She does not think she is having a stroke and thinks it was all related to her blood pressure and heart rate.  She now feels like she is talking more normally and the sensation has improved.  She is denying palpitations or chest pain and only had some mild shortness of breath when she was near syncopal.  She is denying any vomiting, constipation, diarrhea, or urinary changes.  Denies any new leg pain or leg swelling.  She says that she had an ultrasound of her kidneys today due to concern for lupus hurting her kidneys.  On arrival, she is on an epi drip and EMS reported that when they turned it off she immediately became bradycardic lightheaded and  hypotensive.  Thus, we will keep her on the epi drip.  On my exam, lungs were clear and chest is nontender.  Abdomen nontender.  She does have dry mucous membranes we will continue some fluids.  Mild numbness in her left face compared to right but she reports this is improving and she had clear speech for me.  10:44 PM Cardiology saw patient.  They requested we stop the epi drip to see what her blood pressure and heart rate does.  If it starts to slow down they recommended atropine intermittently instead of the epi drip at this time.  They also request a CT of the head to make sure there is no intracranial abnormality causing a bradycardia or Cushing triad type picture.  Will get the head CT and stop the epi.  If she is dependent on a drip and epi, she would like in the ICU but if not, she would likely stable for a stepdown or medicine service.  Care transferred to oncoming team to wait for reassessment of blood pressure and heart rate and for CT head results.        Final Clinical Impression(s) / ED Diagnoses Final diagnoses:  Hypotension, unspecified hypotension type  Bradycardia    Clinical Impression: 1. Hypotension, unspecified hypotension type   2. Bradycardia     Disposition: Care transferred to oncoming team to wait for reassessment of blood pressure and heart rate and for CT head results.  This note was prepared with assistance of Conservation officer, historic buildings. Occasional wrong-word or sound-a-like substitutions may have occurred due to the inherent limitations of voice recognition software.     Whitney Bingaman, Canary Brim, MD 10/02/23 2306

## 2023-10-02 NOTE — ED Triage Notes (Signed)
BIB EMS - Hx of Low HR and hypotension x1 month. Today's episode lasted approx 3 hours. BP with EMS was 70/40 HR was 40. Hx of Stroke with left sided deficits.  Arrived on an Epi drip, HR in the 70s. EMS administered 500 NS bolus also.   Patient has 1st degree AV block on EKG and Family hx of Pacemakers.

## 2023-10-02 NOTE — Consult Note (Signed)
Cardiology Consultation   Patient ID: LAVETT LIPS MRN: [id number]; DOB: 03/21/1956  Admit date: 10/02/2023 Date of Consult: 10/02/2023  PCP:  Etta Grandchild, MD   [facility] Providers Cardiologist:  None   { Click here to update MD or APP on Care Team, Refresh:1}     Patient Profile:   Debbie Hatfield is a 67 y.o. female with a hx of *** who is being seen 10/02/2023 for the evaluation of *** at the request of ***.  History of Present Illness:   Ms. Deforest ***   Past Medical History:  Diagnosis Date   Closed fracture of right distal radius    Gastroparesis    GERD (gastroesophageal reflux disease)    Hypertension    Irritable bowel disease    Lupus    Major depression    Stroke [facility])    Trigeminal neuralgia     Past Surgical History:  Procedure Laterality Date   BUBBLE STUDY  07/05/2022   Procedure: BUBBLE STUDY;  Surgeon: Parke Poisson, MD;  Location: [facility] ENDOSCOPY;  Service: Cardiology;;   CARPAL TUNNEL RELEASE Left 05/15/2023   CHOLECYSTECTOMY     COLONOSCOPY     LOOP RECORDER IMPLANT  2021   ORIF WRIST FRACTURE Right 09/22/2020   Procedure: OPEN REDUCTION INTERNAL FIXATION (ORIF) WRIST FRACTURE;  Surgeon: Ernest Mallick, MD;  Location: [facility];  Service: Orthopedics;  Laterality: Right;    TEE WITHOUT CARDIOVERSION N/A 07/05/2022   Procedure: TRANSESOPHAGEAL ECHOCARDIOGRAM (TEE);  Surgeon: Parke Poisson, MD;  Location: [facility] ENDOSCOPY;  Service: Cardiology;  Laterality: N/A;     {Home Medications (Optional):21181}  Inpatient Medications: Scheduled Meds:  Continuous Infusions:  epinephrine Stopped (10/02/23 2249)   PRN Meds:   Allergies:    Allergies  Allergen Reactions   Plaquenil [Hydroxychloroquine]     Retinal damage   Sulfa Antibiotics Nausea And Vomiting   Sulfamethoxazole-Trimethoprim Other (See Comments)   Penicillins Rash    Social History:   Social History   Socioeconomic  History   Marital status: Widowed    Spouse name: Not on file   Number of children: 0   Years of education: Not on file   Highest education level: Bachelor's degree (e.g., BA, AB, BS)  Occupational History    Comment: RN ortho/neuro at [employer]  Tobacco Use   Smoking status: Never    Passive exposure: Never   Smokeless tobacco: Never  Vaping Use   Vaping status: Never Used  Substance and Sexual Activity   Alcohol use: Yes    Comment: social   Drug use: Never   Sexual activity: Not Currently    Birth control/protection: Post-menopausal  Other Topics Concern   Not on file  Social History Narrative   06/13/22 living with her mother   2 [place] Dew per Week   Social Drivers of Health   Financial Resource Strain: Medium Risk (07/27/2023)   Overall Financial Resource Strain (CARDIA)    Difficulty of Paying Living Expenses: Somewhat hard  Food Insecurity: No Food Insecurity (07/27/2023)   Hunger Vital Sign    Worried About Running Out of Food in the Last Year: Never true    Ran Out of Food in the Last Year: Never true  Transportation Needs: No Transportation Needs (07/27/2023)   PRAPARE - Administrator, Civil Service (Medical): No    Lack of Transportation (Non-Medical): No  Physical Activity: Sufficiently Active (07/27/2023)   Exercise Vital  Sign    Days of Exercise per Week: 4 days    Minutes of Exercise per Session: 40 min  Stress: Stress Concern Present (07/27/2023)   [employer] of Occupational Health - Occupational Stress Questionnaire    Feeling of Stress : Very much  Social Connections: Moderately Isolated (07/27/2023)   Social Connection and Isolation Panel [NHANES]    Frequency of Communication with Friends and Family: Three times a week    Frequency of Social Gatherings with Friends and Family: Twice a week    Attends Religious Services: Never    Database administrator or Organizations: No    Attends Engineer, structural: More than 4  times per year    Marital Status: Widowed  Intimate Partner Violence: Not At Risk (10/24/2022)   Humiliation, Afraid, Rape, and Kick questionnaire    Fear of Current or Ex-Partner: No    Emotionally Abused: No    Physically Abused: No    Sexually Abused: No    Family History:   *** Family History  Problem Relation Age of Onset   Heart block Mother        Pacemaker   Hypertension Mother    Depression Mother    Gout Mother    Macular degeneration Mother    Aneurysm Mother    Alcoholism Father    Congestive Heart Failure Maternal Grandmother    Diabetes Maternal Grandmother    Congestive Heart Failure Maternal Grandfather    Colon cancer Maternal Grandfather    Stroke Paternal Grandfather    Hypertension Paternal Grandfather    Esophageal cancer Neg Hx    Stomach cancer Neg Hx    Rectal cancer Neg Hx      ROS:  Please see the history of present illness.  *** All other ROS reviewed and negative.     Physical Exam/Data:   Vitals:   10/02/23 2145 10/02/23 2200 10/02/23 2250 10/02/23 2300  BP: 137/68 131/81 (!) 184/129 125/65  Pulse: (!) 52 (!) 56 94 66  Resp: 14 14 (!) 21 15  Temp:      TempSrc:      SpO2: 100% 100% 100% 100%  Weight:      Height:        Intake/Output Summary (Last 24 hours) at 10/02/2023 2326 Last data filed at 10/02/2023 2114 Gross per 24 hour  Intake --  Output 50 ml  Net -50 ml      10/02/2023    7:35 PM 09/18/2023    9:00 AM 09/10/2023    2:24 PM  Last 3 Weights  Weight (lbs) 140 lb  146 lb 3.2 oz  Weight (kg) 63.504 kg  66.316 kg     Information is confidential and restricted. Go to Review Flowsheets to unlock data.     Body mass index is 25.61 kg/m.  General:  Well nourished, well developed, in no acute distress*** HEENT: normal Neck: no JVD Vascular: No carotid bruits; Distal pulses 2+ bilaterally Cardiac:  normal S1, S2; RRR; no murmur *** Lungs:  clear to auscultation bilaterally, no wheezing, rhonchi or rales  Abd: soft,  nontender, no hepatomegaly  Ext: no edema Musculoskeletal:  No deformities, BUE and BLE strength normal and equal Skin: warm and dry  Neuro:  CNs 2-12 intact, no focal abnormalities noted Psych:  Normal affect   EKG:  The EKG was personally reviewed and demonstrates:  *** Telemetry:  Telemetry was personally reviewed and demonstrates:  ***  Relevant CV Studies: ***  Laboratory Data:  High Sensitivity Troponin:   Recent Labs  Lab 10/02/23 2013 10/02/23 2215  TROPONINIHS 6 7     Chemistry Recent Labs  Lab 10/02/23 2013  NA 138  K 3.6  CL 106  CO2 22  GLUCOSE 118*  BUN 25*  CREATININE 1.73*  CALCIUM 8.7*  MG 2.0  GFRNONAA 32*  ANIONGAP 10    Recent Labs  Lab 10/02/23 2013  PROT 6.2*  ALBUMIN 3.6  AST 26  ALT 16  ALKPHOS 69  BILITOT 0.5   Lipids No results for input(s): "CHOL", "TRIG", "HDL", "LABVLDL", "LDLCALC", "CHOLHDL" in the last 168 hours.  Hematology Recent Labs  Lab 10/02/23 2013  WBC 8.6  RBC 3.49*  HGB 10.2*  HCT 32.8*  MCV 94.0  MCH 29.2  MCHC 31.1  RDW 13.2  PLT 254   Thyroid  Recent Labs  Lab 10/02/23 2013  TSH 1.886    BNP Recent Labs  Lab 10/02/23 2013  BNP 69.5    DDimer No results for input(s): "DDIMER" in the last 168 hours.   Radiology/Studies:  DG Chest Portable 1 View Result Date: 10/02/2023 CLINICAL DATA:  Hypotension. EXAM: PORTABLE CHEST 1 VIEW COMPARISON:  Chest radiograph dated July 10, 2023. FINDINGS: The heart size and mediastinal contours are within normal limits. Loop recorder device projecting over the left chest wall. Both lungs are clear. No sizable pleural effusion or pneumothorax. No acute osseous abnormality. IMPRESSION: No acute cardiopulmonary findings. Electronically Signed   By: Hart Robinsons M.D.   On: 10/02/2023 21:01   US RENAL ARTERY DUPLEX COMPLETE Result Date: 10/02/2023 CLINICAL DATA:  Stage III A chronic kidney disease Hypertension EXAM: RENAL/URINARY TRACT ULTRASOUND RENAL DUPLEX  DOPPLER ULTRASOUND COMPARISON:  None available FINDINGS: Right Kidney: Length: 9.1 cm. Echogenicity within normal limits. No mass or hydronephrosis visualized. Left Kidney: Length: 10.6 cm. Echogenicity within normal limits. No mass or hydronephrosis visualized. Bladder: Evaluation limited by underdistention. Grossly unremarkable. RENAL DUPLEX ULTRASOUND Right Renal Artery Velocities: Origin:  97 cm/sec Mid:  86 cm/sec Hilum:  92 cm/sec Interlobar:  85 cm/sec Arcuate:  40 cm/sec Left Renal Artery Velocities: Origin:  81 cm/sec Mid:  90 cm/sec Hilum:  78 cm/sec Interlobar:  62 cm/sec Arcuate:  52 cm/sec Aortic Velocity:  100 cm/sec Right Renal-Aortic Ratios: Origin: 1.0 Mid:  0.9 Hilum: 0.9 Interlobar: 0.9 Arcuate: 0.4 Left Renal-Aortic Ratios: Origin: 0.8 Mid: 0.9 Hilum: 0.8 Interlobar: 0.6 Arcuate: 0.5 IMPRESSION: 1. No significant grayscale abnormality of the kidneys. 2. No Doppler evidence of significant renal artery stenosis. Electronically Signed   By: Acquanetta Belling M.D.   On: 10/02/2023 10:02     Assessment and Plan:   ***   Risk Assessment/Risk Scores:  {Complete the following score calculators/questions to meet required metrics.  Press F2         :[id number]}   {Is the patient being seen for unstable angina, ACS, NSTEMI or STEMI?[phone]} {Does this patient have CHF or CHF symptoms?      :[id number]} {Does this patient have ATRIAL FIBRILLATION?[phone]}  {Are we signing off today?:[id number]}  For questions or updates, please contact [facility] Please consult [email or web address] for contact info under    Signed, Karl Ito, MD  10/02/2023 11:26 PM

## 2023-10-03 ENCOUNTER — Encounter: Payer: Self-pay | Admitting: Cardiology

## 2023-10-03 ENCOUNTER — Other Ambulatory Visit: Payer: Self-pay

## 2023-10-03 ENCOUNTER — Observation Stay (HOSPITAL_COMMUNITY): Payer: Medicare Other

## 2023-10-03 ENCOUNTER — Telehealth: Payer: Self-pay

## 2023-10-03 ENCOUNTER — Encounter (HOSPITAL_COMMUNITY): Payer: Self-pay | Admitting: Internal Medicine

## 2023-10-03 DIAGNOSIS — I10 Essential (primary) hypertension: Secondary | ICD-10-CM

## 2023-10-03 DIAGNOSIS — N179 Acute kidney failure, unspecified: Secondary | ICD-10-CM

## 2023-10-03 DIAGNOSIS — F418 Other specified anxiety disorders: Secondary | ICD-10-CM

## 2023-10-03 DIAGNOSIS — R55 Syncope and collapse: Secondary | ICD-10-CM | POA: Diagnosis not present

## 2023-10-03 DIAGNOSIS — I959 Hypotension, unspecified: Secondary | ICD-10-CM | POA: Diagnosis present

## 2023-10-03 DIAGNOSIS — Z8673 Personal history of transient ischemic attack (TIA), and cerebral infarction without residual deficits: Secondary | ICD-10-CM

## 2023-10-03 DIAGNOSIS — R001 Bradycardia, unspecified: Secondary | ICD-10-CM

## 2023-10-03 HISTORY — DX: Other specified anxiety disorders: F41.8

## 2023-10-03 HISTORY — DX: Bradycardia, unspecified: R00.1

## 2023-10-03 HISTORY — DX: Hypotension, unspecified: I95.9

## 2023-10-03 HISTORY — DX: Acute kidney failure, unspecified: N17.9

## 2023-10-03 HISTORY — DX: Personal history of transient ischemic attack (TIA), and cerebral infarction without residual deficits: Z86.73

## 2023-10-03 HISTORY — DX: Syncope and collapse: R55

## 2023-10-03 LAB — ECHOCARDIOGRAM COMPLETE
AR max vel: 1.33 cm2
AV Area VTI: 1.36 cm2
AV Area mean vel: 1.45 cm2
AV Mean grad: 9 mm[Hg]
AV Peak grad: 16 mm[Hg]
Ao pk vel: 2 m/s
Area-P 1/2: 3.97 cm2
Calc EF: 67.6 %
Height: 62 in
S' Lateral: 1.9 cm
Single Plane A2C EF: 63.7 %
Single Plane A4C EF: 71.8 %
Weight: 2240 [oz_av]

## 2023-10-03 LAB — CBC
HCT: 36.3 % (ref 36.0–46.0)
Hemoglobin: 11.3 g/dL — ABNORMAL LOW (ref 12.0–15.0)
MCH: 29.4 pg (ref 26.0–34.0)
MCHC: 31.1 g/dL (ref 30.0–36.0)
MCV: 94.3 fL (ref 80.0–100.0)
Platelets: 290 10*3/uL (ref 150–400)
RBC: 3.85 MIL/uL — ABNORMAL LOW (ref 3.87–5.11)
RDW: 13.2 % (ref 11.5–15.5)
WBC: 6.5 10*3/uL (ref 4.0–10.5)
nRBC: 0 % (ref 0.0–0.2)

## 2023-10-03 LAB — BASIC METABOLIC PANEL
Anion gap: 6 (ref 5–15)
BUN: 20 mg/dL (ref 8–23)
CO2: 23 mmol/L (ref 22–32)
Calcium: 8.7 mg/dL — ABNORMAL LOW (ref 8.9–10.3)
Chloride: 110 mmol/L (ref 98–111)
Creatinine, Ser: 1.46 mg/dL — ABNORMAL HIGH (ref 0.44–1.00)
GFR, Estimated: 39 mL/min — ABNORMAL LOW (ref >60)
Glucose, Bld: 97 mg/dL (ref 70–99)
Potassium: 4 mmol/L (ref 3.5–5.1)
Sodium: 139 mmol/L (ref 135–145)

## 2023-10-03 LAB — HIV ANTIBODY (ROUTINE TESTING W REFLEX): HIV Screen 4th Generation wRfx: NONREACTIVE

## 2023-10-03 LAB — SODIUM, URINE, RANDOM: Sodium, Ur: 31 mmol/L

## 2023-10-03 LAB — CBG MONITORING, ED: Glucose-Capillary: 123 mg/dL — ABNORMAL HIGH (ref 70–99)

## 2023-10-03 LAB — CREATININE, URINE, RANDOM: Creatinine, Urine: 37 mg/dL

## 2023-10-03 MED ORDER — ROSUVASTATIN CALCIUM 20 MG PO TABS
20.0000 mg | ORAL_TABLET | Freq: Every day | ORAL | Status: DC
  Administered 2023-10-03: 20 mg via ORAL
  Filled 2023-10-03: qty 1

## 2023-10-03 MED ORDER — PANTOPRAZOLE SODIUM 40 MG PO TBEC
40.0000 mg | DELAYED_RELEASE_TABLET | Freq: Two times a day (BID) | ORAL | Status: DC
  Administered 2023-10-03: 40 mg via ORAL
  Filled 2023-10-03: qty 1

## 2023-10-03 MED ORDER — GABAPENTIN 300 MG PO CAPS
300.0000 mg | ORAL_CAPSULE | Freq: Once | ORAL | Status: AC
  Administered 2023-10-03: 300 mg via ORAL
  Filled 2023-10-03: qty 1

## 2023-10-03 MED ORDER — ONDANSETRON HCL 4 MG/2ML IJ SOLN: 4.0000 mg | Freq: Four times a day (QID) | INTRAMUSCULAR | Status: DC | PRN

## 2023-10-03 MED ORDER — ONDANSETRON HCL 4 MG PO TABS: 4.0000 mg | ORAL_TABLET | Freq: Four times a day (QID) | ORAL | Status: DC | PRN

## 2023-10-03 MED ORDER — HEPARIN SODIUM (PORCINE) 5000 UNIT/ML IJ SOLN
5000.0000 [IU] | Freq: Three times a day (TID) | INTRAMUSCULAR | Status: DC
  Administered 2023-10-03 (×2): 5000 [IU] via SUBCUTANEOUS
  Filled 2023-10-03 (×2): qty 1

## 2023-10-03 MED ORDER — VENLAFAXINE HCL ER 37.5 MG PO CP24
37.5000 mg | ORAL_CAPSULE | Freq: Every day | ORAL | Status: DC
  Filled 2023-10-03: qty 1

## 2023-10-03 MED ORDER — ACETAMINOPHEN 650 MG RE SUPP: 650.0000 mg | Freq: Four times a day (QID) | RECTAL | Status: DC | PRN

## 2023-10-03 MED ORDER — SENNOSIDES-DOCUSATE SODIUM 8.6-50 MG PO TABS: 1.0000 | ORAL_TABLET | Freq: Every evening | ORAL | Status: DC | PRN

## 2023-10-03 MED ORDER — CLOPIDOGREL BISULFATE 75 MG PO TABS
75.0000 mg | ORAL_TABLET | Freq: Every day | ORAL | Status: DC
  Administered 2023-10-03: 75 mg via ORAL
  Filled 2023-10-03: qty 1

## 2023-10-03 MED ORDER — SODIUM CHLORIDE 0.9 % IV BOLUS
500.0000 mL | Freq: Once | INTRAVENOUS | Status: AC
  Administered 2023-10-03: 500 mL via INTRAVENOUS

## 2023-10-03 MED ORDER — ACETAMINOPHEN 325 MG PO TABS
650.0000 mg | ORAL_TABLET | Freq: Four times a day (QID) | ORAL | Status: DC | PRN
  Administered 2023-10-03 (×2): 650 mg via ORAL
  Filled 2023-10-03 (×2): qty 2

## 2023-10-03 MED ORDER — GABAPENTIN 300 MG PO CAPS
300.0000 mg | ORAL_CAPSULE | Freq: Two times a day (BID) | ORAL | Status: DC
  Administered 2023-10-03: 300 mg via ORAL
  Filled 2023-10-03: qty 1

## 2023-10-03 MED ORDER — TELMISARTAN 40 MG PO TABS: 40.0000 mg | ORAL_TABLET | Freq: Every day | ORAL | 3 refills | Status: DC

## 2023-10-03 MED ORDER — SODIUM CHLORIDE 0.9% FLUSH
3.0000 mL | Freq: Two times a day (BID) | INTRAVENOUS | Status: DC
  Administered 2023-10-03 (×2): 3 mL via INTRAVENOUS

## 2023-10-03 NOTE — Discharge Instructions (Signed)
Decrease gabapentin dose to 300 mg twice a day for the next 3-4 days then can resume prior dosing.

## 2023-10-03 NOTE — H&P (Signed)
History and Physical    Debbie Hatfield QIH:[id number] DOB: 1955/12/24 DOA: 10/02/2023  PCP: Etta Grandchild, MD  Patient coming from: Home  I have personally briefly reviewed patient's old medical records in [facility]  Chief Complaint: Near syncope  HPI: Debbie Hatfield is a 67 y.o. female with medical history significant for history of CVA, antiphospholipid syndrome, HTN, HLD, CKD stage II-IIIa, GAD, depression who presented to the ED for evaluation of near syncope.  Patient states that she has been having issues with intermittent hypotension and bradycardia ongoing over the last month.  Over the last week she says she has been nauseous and has not been able to maintain adequate oral intake.  She has not had any emesis or diarrhea.  She has noticed that her blood pressure has been getting low more frequently over this last week as well.  She says earlier today her blood pressure was in the 70s and heart rate in the 40s.  She felt like she was going to pass out, feeling very lightheaded and breaking out in sweats.  Her vision started to go up but she did not completely lose consciousness.  She called EMS and per ED triage documentation her initial BP was 70/40 with heart rate 40.  She was initially given 500 cc normal saline bolus without significant improvement and subsequently started on epinephrine drip.  Of note, patient was previously having issues with uncontrolled hypertension.  She was started on spironolactone a little over 3 weeks ago.  She was also prescribed nebivolol to take as needed if SBP >150 however she says she has not taken it at all due to concern regarding her bradycardia.  She did undergo renal artery duplex earlier this morning which did not show evidence of significant renal artery stenosis or significant grayscale abnormality of the kidneys.  ED Course  Labs/Imaging on admission: I have personally reviewed following labs and imaging studies.  Initial vitals  showed BP 87/34, pulse 57, RR 21, temp 97.6 F, SpO2 96% on room air.  Labs showed WBC 8.6, hemoglobin 10.2, platelets 254,000, sodium 138, potassium 3.6, bicarb 22, BUN 25, creatinine 1.73 (baseline usually 1.0-1.1), serum glucose 118, LFTs within normal limits, magnesium 2.0, BNP 69.5, troponin negative x 2.  TSH 1.886.  Formal chest x-ray negative for focal consolidation, edema, effusion.  Loop recorder device seen in place.  CT head without contrast negative for acute intracranial normality.  Small vessel ischemic changes noted.  Initially patient was on epinephrine drip which has been turned off.  She was given 500 cc normal saline.  Cardiology were consulted and recommended medical admission.  The hospitalist service was consulted to admit for further evaluation and management.  Review of Systems: All systems reviewed and are negative except as documented in history of present illness above.   Past Medical History:  Diagnosis Date   Closed fracture of right distal radius    Gastroparesis    GERD (gastroesophageal reflux disease)    Hypertension    Irritable bowel disease    Lupus    Major depression    Stroke [facility])    Trigeminal neuralgia     Past Surgical History:  Procedure Laterality Date   BUBBLE STUDY  07/05/2022   Procedure: BUBBLE STUDY;  Surgeon: Parke Poisson, MD;  Location: [facility] ENDOSCOPY;  Service: Cardiology;;   CARPAL TUNNEL RELEASE Left 05/15/2023   CHOLECYSTECTOMY     COLONOSCOPY     LOOP RECORDER IMPLANT  2021  ORIF WRIST FRACTURE Right 09/22/2020   Procedure: OPEN REDUCTION INTERNAL FIXATION (ORIF) WRIST FRACTURE;  Surgeon: Ernest Mallick, MD;  Location: [facility];  Service: Orthopedics;  Laterality: Right;    TEE WITHOUT CARDIOVERSION N/A 07/05/2022   Procedure: TRANSESOPHAGEAL ECHOCARDIOGRAM (TEE);  Surgeon: Parke Poisson, MD;  Location: [facility] ENDOSCOPY;  Service: Cardiology;  Laterality: N/A;    Social History:   reports that she has never smoked. She has never been exposed to tobacco smoke. She has never used smokeless tobacco. She reports current alcohol use. She reports that she does not use drugs.  Allergies  Allergen Reactions   Plaquenil [Hydroxychloroquine]     Retinal damage   Sulfa Antibiotics Nausea And Vomiting   Sulfamethoxazole-Trimethoprim Nausea And Vomiting   Penicillins Rash    Family History  Problem Relation Age of Onset   Heart block Mother        Pacemaker   Hypertension Mother    Depression Mother    Gout Mother    Macular degeneration Mother    Aneurysm Mother    Alcoholism Father    Congestive Heart Failure Maternal Grandmother    Diabetes Maternal Grandmother    Congestive Heart Failure Maternal Grandfather    Colon cancer Maternal Grandfather    Stroke Paternal Grandfather    Hypertension Paternal Grandfather    Esophageal cancer Neg Hx    Stomach cancer Neg Hx    Rectal cancer Neg Hx      Prior to Admission medications   Medication Sig Start Date End Date Taking? Authorizing Provider  acetaminophen (TYLENOL) 325 MG tablet Take 650 mg by mouth as needed for mild pain (pain score 1-3) or moderate pain (pain score 4-6).    [provider]  acyclovir (ZOVIRAX) 400 MG tablet TAKE 1 TABLET(400 MG) BY MOUTH DAILY 06/22/23   Etta Grandchild, MD  baclofen (LIORESAL) 10 MG tablet Take 10 mg by mouth 3 (three) times daily as needed for muscle spasms (As tolerated).    [provider]  carbamazepine (CARBATROL) 300 MG 12 hr capsule TAKE 1 CAPSULE(300 MG) BY MOUTH AT BEDTIME Patient not taking: Reported on 09/18/2023 08/14/23   Etta Grandchild, MD  clopidogrel (PLAVIX) 75 MG tablet One po qd 08/07/23   Sater, Pearletha Furl, MD  gabapentin (NEURONTIN) 300 MG capsule Take 600 mg by mouth 2 (two) times daily.    [provider]  HYDROcodone-acetaminophen (NORCO) 10-325 MG tablet Take 1 tablet by mouth 3 (three) times daily as needed. 06/26/23   [provider]  hydrOXYzine (ATARAX) 10 MG tablet Take 1 tablet (10 mg total) by mouth every 8 (eight) hours as needed for anxiety. Patient not taking: Reported on 09/18/2023 10/23/22   Etta Grandchild, MD  meclizine (ANTIVERT) 25 MG tablet Take 25 mg by mouth daily as needed for dizziness. Patient not taking: Reported on 09/18/2023    [provider]  nebivolol (BYSTOLIC) 5 MG tablet Take 1 tablet (5 mg total) by mouth daily as needed (if SBP > 150.). Patient not taking: Reported on 09/18/2023 09/10/23   Baldo Daub, MD  pantoprazole (PROTONIX) 40 MG tablet Take 1 tablet (40 mg total) by mouth 2 (two) times daily. 05/16/23   McMichael, Saddie Benders, PA-C  rosuvastatin (CRESTOR) 20 MG tablet Take 1 tablet (20 mg total) by mouth daily. 07/29/23   Etta Grandchild, MD  spironolactone (ALDACTONE) 25 MG tablet Take 1 tablet (25 mg total) by mouth  daily. Patient not taking: Reported on 09/18/2023 09/10/23   Baldo Daub, MD  sucralfate (CARAFATE) 1 GM/10ML suspension Take 1 g by mouth 3 (three) times daily as needed (Ulcers). Patient not taking: Reported on 09/18/2023    [provider]  telmisartan-hydrochlorothiazide (MICARDIS HCT) 40-12.5 MG tablet Take 1 tablet by mouth daily. 07/29/23   Etta Grandchild, MD  tizanidine (ZANAFLEX) 2 MG capsule Take 4 mg by mouth at bedtime. 07/03/22   [provider]  traMADol (ULTRAM) 50 MG tablet Take 50 mg by mouth every 4 (four) hours as needed. 09/03/23   [provider]  venlafaxine XR (EFFEXOR-XR) 37.5 MG 24 hr capsule Take 1 capsule (37.5 mg total) by mouth daily with breakfast. Patient taking differently: Take 37.5 mg by mouth 2 (two) times daily. 07/29/23   Etta Grandchild, MD    Physical Exam: Vitals:   10/02/23 2300 10/02/23 2345 10/03/23 0000 10/03/23 0020  BP: 125/65  109/76 (!) 150/68  Pulse: 66 (!) 58 70 (!) 54  Resp: 15 19 17 11   Temp: 98 F (36.7 C)     TempSrc: Oral     SpO2: 100% 100% 92% 100%  Weight:       Height:       Constitutional: Resting in bed, NAD, calm, comfortable Eyes: PERRL, lids and conjunctivae normal ENMT: Mucous membranes are moist. Posterior pharynx clear of any exudate or lesions.Normal dentition.  Neck: normal, supple, no masses. Respiratory: clear to auscultation bilaterally, no wheezing, no crackles. Normal respiratory effort. No accessory muscle use.  Cardiovascular: Regular rate and rhythm, no murmurs / rubs / gallops. No extremity edema. 2+ pedal pulses. Abdomen: no tenderness, no masses palpated. No hepatosplenomegaly. Bowel sounds positive.  Musculoskeletal: no clubbing / cyanosis. No joint deformity upper and lower extremities. Good ROM, no contractures. Normal muscle tone.  Skin: no rashes, lesions, ulcers. No induration Neurologic: CN 2-12 grossly intact. Sensation intact. Strength 5/5 in all 4.  Psychiatric: Normal judgment and insight. Alert and oriented x 3. Normal mood.   EKG: Personally reviewed.  Sinus rhythm, rate 58, first-degree AV block.  Similar to previous from 07/18/2023.   Assessment/Plan Principal Problem:   Near syncope Active Problems:   Hypotension   AKI (acute kidney injury) (HCC)   Hyperlipidemia LDL goal <100   History of CVA (cerebrovascular accident)   Depression with anxiety   Debbie Hatfield is a 67 y.o. female with medical history significant for history of CVA, antiphospholipid syndrome, HTN, HLD, CKD stage II-IIIa, GAD, depression who is admitted with symptomatic hypotension and AKI.  Assessment and Plan: Presyncope/hypotension/bradycardia: Presyncope due to hypotension in setting of volume depletion from poor oral intake and antihypertensives.  Also with bradycardia with HR in the 40s prior to arrival. Improved with epinephrine drip which has been discontinued, vitals remain stable. -Holding antihypertensives -Receiving additional 500 NS bolus -Cardiology following, echocardiogram ordered -Loop recorder to be  interrogated -Follow orthostatic vitals  Acute kidney injury: Creatinine 1.73 on admission.  Looks like baseline creatinine is 1.0-1.1 with variable labs indicating CKD stage II-IIIa.  This is multifactorial related to hypotension, hypovolemia, and medication effect. -S/p 500 cc NS, receiving additional 500 normal saline -Hold telmisartan, HCTZ, spironolactone -Urine lites in process -Renal ultrasound 12/18 negative for significant abnormality or renal artery stenosis  History of CVA Hyperlipidemia: Continue Plavix and rosuvastatin.  Depression/anxiety: Can resume home meds pending medication reconciliation.   DVT prophylaxis: heparin injection 5,000 Units Start: 10/03/23 0600 Code Status:   Code  Status: Limited: Do not attempt resuscitation (DNR) -DNR-LIMITED -Do Not Intubate/DNI Confirmed with patient on admission. Family Communication: Discussed with patient, she has discussed with family Disposition Plan: From home and likely discharge to home pending clinical progress Consults called: Cardiology Severity of Illness: The appropriate patient status for this patient is OBSERVATION. Observation status is judged to be reasonable and necessary in order to provide the required intensity of service to ensure the patient's safety. The patient's presenting symptoms, physical exam findings, and initial radiographic and laboratory data in the context of their medical condition is felt to place them at decreased risk for further clinical deterioration. Furthermore, it is anticipated that the patient will be medically stable for discharge from the hospital within 2 midnights of admission.   Darreld Mclean MD [facility]  If 7PM-7AM, please contact night-coverage [email or web address]  10/03/2023, 1:10 AM

## 2023-10-03 NOTE — ED Notes (Signed)
Patient ambulated to bathroom with steady gait and no assistance.

## 2023-10-03 NOTE — ED Provider Notes (Signed)
Blood pressure 109/76, pulse 70, temperature 98 F (36.7 C), temperature source Oral, resp. rate 17, height 5\' 2"  (1.575 m), weight 63.5 kg, SpO2 92%.  Assuming care from Dr. Rush Landmark.  In short, Debbie Hatfield is a 67 y.o. female with a chief complaint of Hypotension .  Refer to the original H&P for additional details.  The current plan of care is to follow up after CT head.  12:05 AM  CT head without acute findings. Discussed case with [facility] who will admit.     Maia Plan, MD 10/03/23 [phone]

## 2023-10-03 NOTE — Hospital Course (Addendum)
Debbie Hatfield is a 67 y.o. female with PMH CVA, antiphospholipid syndrome, HTN, HLD, CKD stage II-IIIa, GAD, depression who is admitted with symptomatic hypotension, bradycardia, and AKI.

## 2023-10-03 NOTE — Discharge Summary (Signed)
Physician Discharge Summary   Debbie Hatfield:[id number] DOB: March 24, 1956 DOA: 10/02/2023  PCP: Etta Grandchild, MD  Admit date: 10/02/2023 Discharge date: 10/03/2023  Admitted From: Home Disposition:  Home Discharging physician: Lewie Chamber, MD Barriers to discharge: none  Recommendations at discharge: Follow up with cardiology Repeat BMP Check BP   Discharge Condition: stable CODE STATUS: DNR Diet recommendation:  Diet Orders (From admission, onward)     Start     Ordered   10/03/23 0059  Diet Heart Room service appropriate? Yes; Fluid consistency: Thin  Diet effective now       Question Answer Comment  Room service appropriate? Yes   Fluid consistency: Thin      10/03/23 0104   10/03/23 0000  Diet - low sodium heart healthy        10/03/23 1704            Hospital Course: Debbie Hatfield is a 67 y.o. female with PMH CVA, antiphospholipid syndrome, HTN, HLD, CKD stage II-IIIa, GAD, depression who is admitted with symptomatic hypotension, bradycardia, and AKI.  Assessment and Plan:  Presyncope/hypotension/bradycardia Presyncope suspected in setting of hypotension from volume depletion from poor oral intake and antihypertensives.  Also with bradycardia with HR in the 40s prior to arrival. Improved with epinephrine drip which has been discontinued, vitals remain stable. - d/c hydrochlorothiazide and use plan micardis at discharge (discussed with cardiology) -Patient had not yet started nebivolol but also told to continue holding at discharge -Orthostatics negative on the morning of 10/03/2023 -Loop recorder interrogated per cardiology with no explanation for clinical presentation; echo also reassuring with normal LV function and no AS   Acute kidney injury: Creatinine 1.73 on admission.  Looks like baseline creatinine is 1.0-1.1 with variable labs indicating CKD stage II-IIIa.  This is multifactorial related to hypotension, hypovolemia, and medication  effect. -S/p IVF on admission - FeNa 1.1% but close enough to likely be a prerenal etiology; responded to IVF overnight  -Creatinine improved to 1.46 at discharge -Repeat BMP at follow-up -Telmisartan continued at discharge; HCTZ discontinued -Renal ultrasound 12/18 negative for significant abnormality or renal artery stenosis   History of CVA Hyperlipidemia: Continue Plavix and rosuvastatin.   Depression/anxiety: -Continue Effexor  Neuropathy - Gabapentin dose renally adjusted at discharge for a few days then home dosing okay to be resumed - Repeat BMP at follow-up visit  The patient's acute and chronic medical conditions were treated accordingly. On day of discharge, patient was felt deemed stable for discharge. Patient/family member advised to call PCP or come back to ER if needed.   Principal Diagnosis: Near syncope  Discharge Diagnoses: Active Hospital Problems   Diagnosis Date Noted   Near syncope 10/03/2023    Priority: 1.   Hypotension 10/03/2023    Priority: 2.   Sinus bradycardia 10/03/2023    Priority: 2.   AKI (acute kidney injury) (HCC) 10/03/2023    Priority: 3.   History of CVA (cerebrovascular accident) 10/03/2023   Depression with anxiety 10/03/2023   Hyperlipidemia LDL goal <100 03/26/2022    Resolved Hospital Problems  No resolved problems to display.     Discharge Instructions     Diet - low sodium heart healthy   Complete by: As directed    Increase activity slowly   Complete by: As directed       Allergies as of 10/03/2023       Reactions   Plaquenil [hydroxychloroquine]    Retinal damage   Sulfa Antibiotics Nausea  And Vomiting   Sulfamethoxazole-trimethoprim Nausea And Vomiting   Penicillins Rash        Medication List     PAUSE taking these medications    nebivolol 5 MG tablet Wait to take this until your doctor or other care provider tells you to start again. Commonly known as: Bystolic Take 1 tablet (5 mg total) by mouth  daily as needed (if SBP > 150.).       STOP taking these medications    telmisartan-hydrochlorothiazide 40-12.5 MG tablet Commonly known as: MICARDIS HCT       TAKE these medications    ACETAMINOPHEN PO Take 1,300 mg by mouth in the morning and at bedtime.   acyclovir 400 MG tablet Commonly known as: ZOVIRAX TAKE 1 TABLET(400 MG) BY MOUTH DAILY   baclofen 10 MG tablet Commonly known as: LIORESAL Take 10 mg by mouth daily as needed for muscle spasms (As tolerated).   clopidogrel 75 MG tablet Commonly known as: PLAVIX One po qd   gabapentin 300 MG capsule Commonly known as: NEURONTIN Take 600 mg by mouth 2 (two) times daily.   pantoprazole 40 MG tablet Commonly known as: PROTONIX Take 1 tablet (40 mg total) by mouth 2 (two) times daily.   rosuvastatin 20 MG tablet Commonly known as: CRESTOR Take 1 tablet (20 mg total) by mouth daily.   spironolactone 25 MG tablet Commonly known as: ALDACTONE Take 1 tablet (25 mg total) by mouth daily.   telmisartan 40 MG tablet Commonly known as: MICARDIS Take 1 tablet (40 mg total) by mouth daily.   tizanidine 2 MG capsule Commonly known as: ZANAFLEX Take 4 mg by mouth at bedtime.   traMADol 50 MG tablet Commonly known as: ULTRAM Take 50 mg by mouth every 4 (four) hours as needed.   venlafaxine XR 37.5 MG 24 hr capsule Commonly known as: EFFEXOR-XR Take 1 capsule (37.5 mg total) by mouth daily with breakfast. What changed: when to take this        Allergies  Allergen Reactions   Plaquenil [Hydroxychloroquine]     Retinal damage   Sulfa Antibiotics Nausea And Vomiting   Sulfamethoxazole-Trimethoprim Nausea And Vomiting   Penicillins Rash    Consultations: Cardiology   Procedures:   Discharge Exam: BP (!) 146/117   Pulse 63   Temp 98.2 F (36.8 C) (Oral)   Resp 18   Ht 5\' 2"  (1.575 m)   Wt 63.6 kg   SpO2 100%   BMI 25.64 kg/m  Physical Exam Constitutional:      Appearance: Normal appearance.   HENT:     Head: Normocephalic and atraumatic.     Mouth/Throat:     Mouth: Mucous membranes are moist.  Eyes:     Extraocular Movements: Extraocular movements intact.  Cardiovascular:     Rate and Rhythm: Normal rate and regular rhythm.     Heart sounds: Murmur heard.  Pulmonary:     Effort: Pulmonary effort is normal. No respiratory distress.     Breath sounds: Normal breath sounds. No wheezing.  Abdominal:     General: Bowel sounds are normal. There is no distension.     Palpations: Abdomen is soft.     Tenderness: There is no abdominal tenderness.  Musculoskeletal:        General: Normal range of motion.     Cervical back: Normal range of motion and neck supple.  Skin:    General: Skin is warm and dry.  Neurological:  General: No focal deficit present.     Mental Status: She is alert.  Psychiatric:        Mood and Affect: Mood normal.      The results of significant diagnostics from this hospitalization (including imaging, microbiology, ancillary and laboratory) are listed below for reference.   Microbiology: No results found for this or any previous visit (from the past 240 hours).   Labs: BNP (last 3 results) Recent Labs    10/02/23 2013  BNP 69.5   Basic Metabolic Panel: Recent Labs  Lab 10/02/23 2013 10/03/23 0519  NA 138 139  K 3.6 4.0  CL 106 110  CO2 22 23  GLUCOSE 118* 97  BUN 25* 20  CREATININE 1.73* 1.46*  CALCIUM 8.7* 8.7*  MG 2.0  --    Liver Function Tests: Recent Labs  Lab 10/02/23 2013  AST 26  ALT 16  ALKPHOS 69  BILITOT 0.5  PROT 6.2*  ALBUMIN 3.6   No results for input(s): "LIPASE", "AMYLASE" in the last 168 hours. No results for input(s): "AMMONIA" in the last 168 hours. CBC: Recent Labs  Lab 10/02/23 2013 10/03/23 0519  WBC 8.6 6.5  NEUTROABS 4.3  --   HGB 10.2* 11.3*  HCT 32.8* 36.3  MCV 94.0 94.3  PLT 254 290   Cardiac Enzymes: No results for input(s): "CKTOTAL", "CKMB", "CKMBINDEX", "TROPONINI" in the last  168 hours. BNP: Invalid input(s): "POCBNP" CBG: Recent Labs  Lab 10/03/23 0754  GLUCAP 123*   D-Dimer No results for input(s): "DDIMER" in the last 72 hours. Hgb A1c No results for input(s): "HGBA1C" in the last 72 hours. Lipid Profile No results for input(s): "CHOL", "HDL", "LDLCALC", "TRIG", "CHOLHDL", "LDLDIRECT" in the last 72 hours. Thyroid function studies Recent Labs    10/02/23 2013  TSH 1.886   Anemia work up No results for input(s): "VITAMINB12", "FOLATE", "FERRITIN", "TIBC", "IRON", "RETICCTPCT" in the last 72 hours. Urinalysis    Component Value Date/Time   COLORURINE YELLOW 10/02/2023 1959   APPEARANCEUR CLEAR 10/02/2023 1959   LABSPEC 1.010 10/02/2023 1959   PHURINE 5.0 10/02/2023 1959   GLUCOSEU NEGATIVE 10/02/2023 1959   GLUCOSEU NEGATIVE 07/29/2023 1506   HGBUR NEGATIVE 10/02/2023 1959   BILIRUBINUR NEGATIVE 10/02/2023 1959   KETONESUR NEGATIVE 10/02/2023 1959   PROTEINUR NEGATIVE 10/02/2023 1959   UROBILINOGEN 0.2 07/29/2023 1506   NITRITE NEGATIVE 10/02/2023 1959   LEUKOCYTESUR NEGATIVE 10/02/2023 1959   Sepsis Labs Recent Labs  Lab 10/02/23 2013 10/03/23 0519  WBC 8.6 6.5   Microbiology No results found for this or any previous visit (from the past 240 hours).  Procedures/Studies: ECHOCARDIOGRAM COMPLETE Result Date: 10/03/2023    ECHOCARDIOGRAM REPORT   Patient Name:   Debbie Hatfield Date of Exam: 10/03/2023 Medical Rec #:  [id number]      Height:       62.0 in Accession #:    [id number]     Weight:       140.0 lb Date of Birth:  11-21-1955       BSA:          1.643 m Patient Age:    67 years       BP:           111/93 mmHg Patient Gender: F              HR:           59 bpm. Exam Location:  Inpatient Procedure: 2D Echo, 3D Echo, Cardiac  Doppler and Color Doppler Indications:    R55 Syncope  History:        Patient has prior history of Echocardiogram examinations, most                 recent 05/07/2022. Stroke, Signs/Symptoms:Hypotension; Risk                  Factors:Hypertension.  Sonographer:    Webb Laws Referring Phys: [id number] LONNIE SULLIVAN IMPRESSIONS  1. Left ventricular ejection fraction, by estimation, is 60 to 65%. The left ventricle has normal function. The left ventricle has no regional wall motion abnormalities. There is mild left ventricular hypertrophy. Left ventricular diastolic parameters are consistent with Grade I diastolic dysfunction (impaired relaxation).  2. Right ventricular systolic function is normal. The right ventricular size is normal. Tricuspid regurgitation signal is inadequate for assessing PA pressure.  3. Left atrial size was mild to moderately dilated.  4. No evidence of mitral valve regurgitation. Moderate mitral annular calcification.  5. The aortic valve is tricuspid. Aortic valve regurgitation is not visualized. Aortic valve sclerosis is present, with no evidence of aortic valve stenosis.  6. The inferior vena cava is normal in size with greater than 50% respiratory variability, suggesting right atrial pressure of 3 mmHg. FINDINGS  Left Ventricle: Left ventricular ejection fraction, by estimation, is 60 to 65%. The left ventricle has normal function. The left ventricle has no regional wall motion abnormalities. The left ventricular internal cavity size was normal in size. There is  mild left ventricular hypertrophy. Left ventricular diastolic parameters are consistent with Grade I diastolic dysfunction (impaired relaxation). Right Ventricle: The right ventricular size is normal. Right ventricular systolic function is normal. Tricuspid regurgitation signal is inadequate for assessing PA pressure. Left Atrium: Left atrial size was mild to moderately dilated. Right Atrium: Right atrial size was normal in size. Pericardium: There is no evidence of pericardial effusion. Mitral Valve: Moderate mitral annular calcification. No evidence of mitral valve regurgitation. Tricuspid Valve: Tricuspid valve regurgitation is not  demonstrated. Aortic Valve: The aortic valve is tricuspid. Aortic valve regurgitation is not visualized. Aortic valve sclerosis is present, with no evidence of aortic valve stenosis. Aortic valve mean gradient measures 9.0 mmHg. Aortic valve peak gradient measures 16.0 mmHg. Aortic valve area, by VTI measures 1.36 cm. Pulmonic Valve: Pulmonic valve regurgitation is trivial. Aorta: The aortic root and ascending aorta are structurally normal, with no evidence of dilitation. Venous: The inferior vena cava is normal in size with greater than 50% respiratory variability, suggesting right atrial pressure of 3 mmHg. IAS/Shunts: No atrial level shunt detected by color flow Doppler.  LEFT VENTRICLE PLAX 2D LVIDd:         3.70 cm     Diastology LVIDs:         1.90 cm     LV e' medial:    7.62 cm/s LV PW:         0.80 cm     LV E/e' medial:  12.4 LV IVS:        1.10 cm     LV e' lateral:   8.16 cm/s LVOT diam:     1.90 cm     LV E/e' lateral: 11.6 LV SV:         69 LV SV Index:   42 LVOT Area:     2.84 cm  3D Volume EF: LV Volumes (MOD)           3D EF:        66 % LV vol d, MOD A2C: 57.0 ml LV EDV:       119 ml LV vol d, MOD A4C: 68.8 ml LV ESV:       40 ml LV vol s, MOD A2C: 20.7 ml LV SV:        79 ml LV vol s, MOD A4C: 19.4 ml LV SV MOD A2C:     36.3 ml LV SV MOD A4C:     68.8 ml LV SV MOD BP:      42.6 ml RIGHT VENTRICLE             IVC RV Basal diam:  3.30 cm     IVC diam: 1.90 cm RV S prime:     11.90 cm/s TAPSE (M-mode): 2.9 cm LEFT ATRIUM             Index        RIGHT ATRIUM           Index LA diam:        3.60 cm 2.19 cm/m   RA Area:     14.20 cm LA Vol (A2C):   45.0 ml 27.39 ml/m  RA Volume:   31.10 ml  18.93 ml/m LA Vol (A4C):   62.6 ml 38.11 ml/m LA Biplane Vol: 57.9 ml 35.24 ml/m  AORTIC VALVE AV Area (Vmax):    1.33 cm AV Area (Vmean):   1.45 cm AV Area (VTI):     1.36 cm AV Vmax:           200.00 cm/s AV Vmean:          136.000 cm/s AV VTI:            0.507 m AV Peak Grad:       16.0 mmHg AV Mean Grad:      9.0 mmHg LVOT Vmax:         93.80 cm/s LVOT Vmean:        69.400 cm/s LVOT VTI:          0.243 m LVOT/AV VTI ratio: 0.48  AORTA Ao Root diam: 2.50 cm Ao Asc diam:  3.20 cm MITRAL VALVE MV Area (PHT): 3.97 cm     SHUNTS MV Decel Time: 191 msec     Systemic VTI:  0.24 m MV E velocity: 94.70 cm/s   Systemic Diam: 1.90 cm MV A velocity: 106.00 cm/s MV E/A ratio:  0.89 Photographer signed by Carolan Clines Signature Date/Time: 10/03/2023/12:24:59 PM    Final    CT HEAD WO CONTRAST ( ) Result Date: 10/02/2023 CLINICAL DATA:  Bradycardia, hypotension, near syncope EXAM: CT HEAD WITHOUT CONTRAST TECHNIQUE: Contiguous axial images were obtained from the base of the skull through the vertex without intravenous contrast. RADIATION DOSE REDUCTION: This exam was performed according to the departmental dose-optimization program which includes automated exposure control, adjustment of the mA and/or kV according to patient size and/or use of iterative reconstruction technique. COMPARISON:  05/08/2022 FINDINGS: Brain: No evidence of acute infarction, hemorrhage, hydrocephalus, extra-axial collection or mass lesion/mass effect. Subcortical white matter and periventricular small vessel ischemic changes. Vascular: Intracranial atherosclerosis. Skull: Normal. Negative for fracture or focal lesion. Sinuses/Orbits: The visualized paranasal sinuses are essentially clear. The mastoid air cells are unopacified. Other: None. IMPRESSION: No acute intracranial abnormality.  Small vessel ischemic changes. Electronically Signed  By: Charline Bills M.D.   On: 10/02/2023 23:54   DG Chest Portable 1 View Result Date: 10/02/2023 CLINICAL DATA:  Hypotension. EXAM: PORTABLE CHEST 1 VIEW COMPARISON:  Chest radiograph dated July 10, 2023. FINDINGS: The heart size and mediastinal contours are within normal limits. Loop recorder device projecting over the left chest wall. Both lungs are  clear. No sizable pleural effusion or pneumothorax. No acute osseous abnormality. IMPRESSION: No acute cardiopulmonary findings. Electronically Signed   By: Hart Robinsons M.D.   On: 10/02/2023 21:01   US RENAL ARTERY DUPLEX COMPLETE Result Date: 10/02/2023 CLINICAL DATA:  Stage III A chronic kidney disease Hypertension EXAM: RENAL/URINARY TRACT ULTRASOUND RENAL DUPLEX DOPPLER ULTRASOUND COMPARISON:  None available FINDINGS: Right Kidney: Length: 9.1 cm. Echogenicity within normal limits. No mass or hydronephrosis visualized. Left Kidney: Length: 10.6 cm. Echogenicity within normal limits. No mass or hydronephrosis visualized. Bladder: Evaluation limited by underdistention. Grossly unremarkable. RENAL DUPLEX ULTRASOUND Right Renal Artery Velocities: Origin:  97 cm/sec Mid:  86 cm/sec Hilum:  92 cm/sec Interlobar:  85 cm/sec Arcuate:  40 cm/sec Left Renal Artery Velocities: Origin:  81 cm/sec Mid:  90 cm/sec Hilum:  78 cm/sec Interlobar:  62 cm/sec Arcuate:  52 cm/sec Aortic Velocity:  100 cm/sec Right Renal-Aortic Ratios: Origin: 1.0 Mid:  0.9 Hilum: 0.9 Interlobar: 0.9 Arcuate: 0.4 Left Renal-Aortic Ratios: Origin: 0.8 Mid: 0.9 Hilum: 0.8 Interlobar: 0.6 Arcuate: 0.5 IMPRESSION: 1. No significant grayscale abnormality of the kidneys. 2. No Doppler evidence of significant renal artery stenosis. Electronically Signed   By: Acquanetta Belling M.D.   On: 10/02/2023 10:02   CUP PACEART REMOTE DEVICE CHECK Result Date: 09/20/2023 ILR summary report received. Battery status OK. Normal device function. No new tachy, brady, or pause episodes. No new AF episodes. Monthly summary reports and ROV/PRN 2 symptom activation's previously reviewed, no associated arrhythmia LA, CVRS    Time coordinating discharge: Over 30 minutes    Lewie Chamber, MD  [facility] 10/03/2023, 5:05 PM

## 2023-10-03 NOTE — ED Notes (Signed)
Girguis MD at bedside

## 2023-10-03 NOTE — Progress Notes (Signed)
Progress Note    Debbie Hatfield   UEA:[id number]  DOB: [birth year]  DOA: 10/02/2023     0 PCP: Etta Grandchild, MD  Initial CC: near syncope  Hospital Course: Debbie Hatfield is a 67 y.o. female with PMH CVA, antiphospholipid syndrome, HTN, HLD, CKD stage II-IIIa, GAD, depression who is admitted with symptomatic hypotension, bradycardia, and AKI.  Interval History:  Still feeling a little "off" in the ER this morning.  Blood pressure and heart rate have improved since admission however.  Assessment and Plan:  Presyncope/hypotension/bradycardia Presyncope due to hypotension in setting of volume depletion from poor oral intake and antihypertensives.  Also with bradycardia with HR in the 40s prior to arrival. Improved with epinephrine drip which has been discontinued, vitals remain stable. -Holding antihypertensives; resumption per cardiology -No further fluid necessary.  Continue oral intake -Follow-up echo - Follow-up loop recorder interrogation -Orthostatics negative on the morning of 10/03/2023   Acute kidney injury: Creatinine 1.73 on admission.  Looks like baseline creatinine is 1.0-1.1 with variable labs indicating CKD stage II-IIIa.  This is multifactorial related to hypotension, hypovolemia, and medication effect. -S/p IVF on admission - FeNa 1.1% but close enough to likely be a prerenal etiology; responded to IVF overnight  -Hold telmisartan, HCTZ, spironolactone -Renal ultrasound 12/18 negative for significant abnormality or renal artery stenosis - trend BMP   History of CVA Hyperlipidemia: Continue Plavix and rosuvastatin.   Depression/anxiety: -Continue Effexor   Old records reviewed in assessment of this patient  Antimicrobials:   DVT prophylaxis:  heparin injection 5,000 Units Start: 10/03/23 0600   Code Status:   Code Status: Limited: Do not attempt resuscitation (DNR) -DNR-LIMITED -Do Not Intubate/DNI   Mobility Assessment (Last 72 Hours)      Mobility Assessment   No documentation.           Barriers to discharge: none Disposition Plan:  Home Status is: Obs  Objective: Blood pressure (!) 146/117, pulse 63, temperature 98 F (36.7 C), resp. rate 18, height 5\' 2"  (1.575 m), weight 63.5 kg, SpO2 100%.  Examination:  Physical Exam Constitutional:      Appearance: Normal appearance.  HENT:     Head: Normocephalic and atraumatic.     Mouth/Throat:     Mouth: Mucous membranes are moist.  Eyes:     Extraocular Movements: Extraocular movements intact.  Cardiovascular:     Rate and Rhythm: Normal rate and regular rhythm.     Heart sounds: Murmur heard.  Pulmonary:     Effort: Pulmonary effort is normal. No respiratory distress.     Breath sounds: Normal breath sounds. No wheezing.  Abdominal:     General: Bowel sounds are normal. There is no distension.     Palpations: Abdomen is soft.     Tenderness: There is no abdominal tenderness.  Musculoskeletal:        General: Normal range of motion.     Cervical back: Normal range of motion and neck supple.  Skin:    General: Skin is warm and dry.  Neurological:     General: No focal deficit present.     Mental Status: She is alert.  Psychiatric:        Mood and Affect: Mood normal.      Consultants:  Cardiology   Procedures:    Data Reviewed: Results for orders placed or performed during the hospital encounter of 10/02/23 (from the past 24 hours)  Urinalysis, w/ Reflex to Culture (Infection Suspected) -Urine, Clean Catch  Status: Abnormal   Collection Time: 10/02/23  7:59 PM  Result Value Ref Range   Specimen Source URINE, CLEAN CATCH    Color, Urine YELLOW YELLOW   APPearance CLEAR CLEAR   Specific Gravity, Urine 1.010 1.005 - 1.030   pH 5.0 5.0 - 8.0   Glucose, UA NEGATIVE NEGATIVE mg/dL   Hgb urine dipstick NEGATIVE NEGATIVE   Bilirubin Urine NEGATIVE NEGATIVE   Ketones, ur NEGATIVE NEGATIVE mg/dL   Protein, ur NEGATIVE NEGATIVE mg/dL   Nitrite  NEGATIVE NEGATIVE   Leukocytes,Ua NEGATIVE NEGATIVE   RBC / HPF 0-5 0 - 5 RBC/hpf   WBC, UA 0-5 0 - 5 WBC/hpf   Bacteria, UA RARE (A) NONE SEEN   Squamous Epithelial / HPF 0-5 0 - 5 /HPF   Mucus PRESENT    Hyaline Casts, UA PRESENT   CBC with Differential     Status: Abnormal   Collection Time: 10/02/23  8:13 PM  Result Value Ref Range   WBC 8.6 4.0 - 10.5 K/uL   RBC 3.49 (L) 3.87 - 5.11 MIL/uL   Hemoglobin 10.2 (L) 12.0 - 15.0 g/dL   HCT 11.9 (L) 14.7 - 82.9 %   MCV 94.0 80.0 - 100.0 fL   MCH 29.2 26.0 - 34.0 pg   MCHC 31.1 30.0 - 36.0 g/dL   RDW 56.2 13.0 - 86.5 %   Platelets 254 150 - 400 K/uL   nRBC 0.0 0.0 - 0.2 %   Neutrophils Relative % 51 %   Neutro Abs 4.3 1.7 - 7.7 K/uL   Lymphocytes Relative 38 %   Lymphs Abs 3.3 0.7 - 4.0 K/uL   Monocytes Relative 10 %   Monocytes Absolute 0.9 0.1 - 1.0 K/uL   Eosinophils Relative 1 %   Eosinophils Absolute 0.1 0.0 - 0.5 K/uL   Basophils Relative 0 %   Basophils Absolute 0.0 0.0 - 0.1 K/uL   Immature Granulocytes 0 %   Abs Immature Granulocytes 0.02 0.00 - 0.07 K/uL  Comprehensive metabolic panel     Status: Abnormal   Collection Time: 10/02/23  8:13 PM  Result Value Ref Range   Sodium 138 135 - 145 mmol/L   Potassium 3.6 3.5 - 5.1 mmol/L   Chloride 106 98 - 111 mmol/L   CO2 22 22 - 32 mmol/L   Glucose, Bld 118 (H) 70 - 99 mg/dL   BUN 25 (H) 8 - 23 mg/dL   Creatinine, Ser 7.84 (H) 0.44 - 1.00 mg/dL   Calcium 8.7 (L) 8.9 - 10.3 mg/dL   Total Protein 6.2 (L) 6.5 - 8.1 g/dL   Albumin 3.6 3.5 - 5.0 g/dL   AST 26 15 - 41 U/L   ALT 16 0 - 44 U/L   Alkaline Phosphatase 69 38 - 126 U/L   Total Bilirubin 0.5 <1.2 mg/dL   GFR, Estimated 32 (L) >60 mL/min   Anion gap 10 5 - 15  Magnesium     Status: None   Collection Time: 10/02/23  8:13 PM  Result Value Ref Range   Magnesium 2.0 1.7 - 2.4 mg/dL  Brain natriuretic peptide     Status: None   Collection Time: 10/02/23  8:13 PM  Result Value Ref Range   B Natriuretic Peptide  69.5 0.0 - 100.0 pg/mL  Troponin I (High Sensitivity)     Status: None   Collection Time: 10/02/23  8:13 PM  Result Value Ref Range   Troponin I (High Sensitivity) 6 <18 ng/L  TSH  Status: None   Collection Time: 10/02/23  8:13 PM  Result Value Ref Range   TSH 1.886 0.350 - 4.500 uIU/mL  Troponin I (High Sensitivity)     Status: None   Collection Time: 10/02/23 10:15 PM  Result Value Ref Range   Troponin I (High Sensitivity) 7 <18 ng/L  Sodium, urine, random     Status: None   Collection Time: 10/02/23 10:39 PM  Result Value Ref Range   Sodium, Ur 31 mmol/L  Creatinine, urine, random     Status: None   Collection Time: 10/02/23 10:39 PM  Result Value Ref Range   Creatinine, Urine 37 mg/dL  HIV Antibody (routine testing w rflx)     Status: None   Collection Time: 10/03/23  5:19 AM  Result Value Ref Range   HIV Screen 4th Generation wRfx Non Reactive Non Reactive  Basic metabolic panel     Status: Abnormal   Collection Time: 10/03/23  5:19 AM  Result Value Ref Range   Sodium 139 135 - 145 mmol/L   Potassium 4.0 3.5 - 5.1 mmol/L   Chloride 110 98 - 111 mmol/L   CO2 23 22 - 32 mmol/L   Glucose, Bld 97 70 - 99 mg/dL   BUN 20 8 - 23 mg/dL   Creatinine, Ser 1.61 (H) 0.44 - 1.00 mg/dL   Calcium 8.7 (L) 8.9 - 10.3 mg/dL   GFR, Estimated 39 (L) >60 mL/min   Anion gap 6 5 - 15  CBC     Status: Abnormal   Collection Time: 10/03/23  5:19 AM  Result Value Ref Range   WBC 6.5 4.0 - 10.5 K/uL   RBC 3.85 (L) 3.87 - 5.11 MIL/uL   Hemoglobin 11.3 (L) 12.0 - 15.0 g/dL   HCT 09.6 04.5 - 40.9 %   MCV 94.3 80.0 - 100.0 fL   MCH 29.4 26.0 - 34.0 pg   MCHC 31.1 30.0 - 36.0 g/dL   RDW 81.1 91.4 - 78.2 %   Platelets 290 150 - 400 K/uL   nRBC 0.0 0.0 - 0.2 %  CBG monitoring, ED     Status: Abnormal   Collection Time: 10/03/23  7:54 AM  Result Value Ref Range   Glucose-Capillary 123 (H) 70 - 99 mg/dL    I have reviewed pertinent nursing notes, vitals, labs, and images as necessary. I  have ordered labwork to follow up on as indicated.  I have reviewed the last notes from staff over past 24 hours. I have discussed patient's care plan and test results with nursing staff, CM/SW, and other staff as appropriate.  Time spent: Greater than 50% of the 55 minute visit was spent in counseling/coordination of care for the patient as laid out in the A&P.   LOS: 0 days   Lewie Chamber, MD [facility] 10/03/2023, 11:42 AM

## 2023-10-03 NOTE — ED Notes (Signed)
ED TO INPATIENT HANDOFF REPORT  ED Nurse Name and Phone #: Dahlia Client [phone]  S Name/Age/Gender Debbie Hatfield 67 y.o. female Room/Bed: 003C/003C  Code Status   Code Status: Limited: Do not attempt resuscitation (DNR) -DNR-LIMITED -Do Not Intubate/DNI   Home/SNF/Other Home Patient oriented to: self, place, time, and situation Is this baseline? Yes   Triage Complete: Triage complete  Chief Complaint Near syncope [R55]  Triage Note BIB EMS - Hx of Low HR and hypotension x1 month. Today's episode lasted approx 3 hours. BP with EMS was 70/40 HR was 40. Hx of Stroke with left sided deficits.  Arrived on an Epi drip, HR in the 70s. EMS administered 500 NS bolus also.   Patient has 1st degree AV block on EKG and Family hx of Pacemakers.    Allergies Allergies  Allergen Reactions   Plaquenil [Hydroxychloroquine]     Retinal damage   Sulfa Antibiotics Nausea And Vomiting   Sulfamethoxazole-Trimethoprim Nausea And Vomiting   Penicillins Rash    Level of Care/Admitting Diagnosis ED Disposition     ED Disposition  Admit   Condition  --   Comment  Hospital [facility] [[id number]]  Level of Care: Progressive [102]  Admit to Progressive based on following criteria: CARDIOVASCULAR & THORACIC of moderate stability with acute coronary syndrome symptoms/low risk myocardial infarction/hypertensive urgency/arrhythmias/heart failure potentially compromising stability and stable post cardiovascular intervention patients.  May place patient in observation at [facility] or [facility] if equivalent level of care is available:: No  Covid Evaluation: Asymptomatic - no recent exposure (last 10 days) testing not required  Diagnosis: Near syncope [phone]  Admitting Physician: Charlsie Quest [[id number]]  Attending Physician: Charlsie Quest [[id number]]          B Medical/Surgery History Past Medical History:  Diagnosis Date   Closed fracture of right distal radius     Gastroparesis    GERD (gastroesophageal reflux disease)    Hypertension    Irritable bowel disease    Lupus    Major depression    Stroke [facility])    Trigeminal neuralgia    Past Surgical History:  Procedure Laterality Date   BUBBLE STUDY  07/05/2022   Procedure: BUBBLE STUDY;  Surgeon: Parke Poisson, MD;  Location: [facility] ENDOSCOPY;  Service: Cardiology;;   CARPAL TUNNEL RELEASE Left 05/15/2023   CHOLECYSTECTOMY     COLONOSCOPY     LOOP RECORDER IMPLANT  2021   ORIF WRIST FRACTURE Right 09/22/2020   Procedure: OPEN REDUCTION INTERNAL FIXATION (ORIF) WRIST FRACTURE;  Surgeon: Ernest Mallick, MD;  Location: [facility];  Service: Orthopedics;  Laterality: Right;    TEE WITHOUT CARDIOVERSION N/A 07/05/2022   Procedure: TRANSESOPHAGEAL ECHOCARDIOGRAM (TEE);  Surgeon: Parke Poisson, MD;  Location: [facility] ENDOSCOPY;  Service: Cardiology;  Laterality: N/A;     A IV Location/Drains/Wounds Patient Lines/Drains/Airways Status     Active Line/Drains/Airways     Name Placement date Placement time Site Days   Peripheral IV 10/02/23 20 G Left;Posterior Hand 10/02/23  1928  Hand  1            Intake/Output Last 24 hours  Intake/Output Summary (Last 24 hours) at 10/03/2023 1152 Last data filed at 10/02/2023 2114 Gross per 24 hour  Intake --  Output 50 ml  Net -50 ml    Labs/Imaging Results for orders placed or performed during the hospital encounter of 10/02/23 (from the past 48 hours)  Urinalysis, w/  Reflex to Culture (Infection Suspected) -Urine, Clean Catch     Status: Abnormal   Collection Time: 10/02/23  7:59 PM  Result Value Ref Range   Specimen Source URINE, CLEAN CATCH    Color, Urine YELLOW YELLOW   APPearance CLEAR CLEAR   Specific Gravity, Urine 1.010 1.005 - 1.030   pH 5.0 5.0 - 8.0   Glucose, UA NEGATIVE NEGATIVE mg/dL   Hgb urine dipstick NEGATIVE NEGATIVE   Bilirubin Urine NEGATIVE NEGATIVE   Ketones, ur NEGATIVE NEGATIVE  mg/dL   Protein, ur NEGATIVE NEGATIVE mg/dL   Nitrite NEGATIVE NEGATIVE   Leukocytes,Ua NEGATIVE NEGATIVE   RBC / HPF 0-5 0 - 5 RBC/hpf   WBC, UA 0-5 0 - 5 WBC/hpf    Comment:        Reflex urine culture not performed if WBC <=10, OR if Squamous epithelial cells >5. If Squamous epithelial cells >5 suggest recollection.    Bacteria, UA RARE (A) NONE SEEN   Squamous Epithelial / HPF 0-5 0 - 5 /HPF   Mucus PRESENT    Hyaline Casts, UA PRESENT     Comment: Performed at [facility], [address], [place], [place] [address]  CBC with Differential     Status: Abnormal   Collection Time: 10/02/23  8:13 PM  Result Value Ref Range   WBC 8.6 4.0 - 10.5 K/uL   RBC 3.49 (L) 3.87 - 5.11 MIL/uL   Hemoglobin 10.2 (L) 12.0 - 15.0 g/dL   HCT 60.4 (L) 54.0 - 98.1 %   MCV 94.0 80.0 - 100.0 fL   MCH 29.2 26.0 - 34.0 pg   MCHC 31.1 30.0 - 36.0 g/dL   RDW 19.1 47.8 - 29.5 %   Platelets 254 150 - 400 K/uL   nRBC 0.0 0.0 - 0.2 %   Neutrophils Relative % 51 %   Neutro Abs 4.3 1.7 - 7.7 K/uL   Lymphocytes Relative 38 %   Lymphs Abs 3.3 0.7 - 4.0 K/uL   Monocytes Relative 10 %   Monocytes Absolute 0.9 0.1 - 1.0 K/uL   Eosinophils Relative 1 %   Eosinophils Absolute 0.1 0.0 - 0.5 K/uL   Basophils Relative 0 %   Basophils Absolute 0.0 0.0 - 0.1 K/uL   Immature Granulocytes 0 %   Abs Immature Granulocytes 0.02 0.00 - 0.07 K/uL    Comment: Performed at [facility], [address] [place], [place] [address]  Comprehensive metabolic panel     Status: Abnormal   Collection Time: 10/02/23  8:13 PM  Result Value Ref Range   Sodium 138 135 - 145 mmol/L   Potassium 3.6 3.5 - 5.1 mmol/L   Chloride 106 98 - 111 mmol/L   CO2 22 22 - 32 mmol/L   Glucose, Bld 118 (H) 70 - 99 mg/dL    Comment: Glucose reference range applies only to samples taken after fasting for at least 8 hours.   BUN 25 (H) 8 - 23 mg/dL   Creatinine, Ser 8.65 (H) 0.44 - 1.00 mg/dL   Calcium 8.7 (L) 8.9 - 10.3 mg/dL    Total Protein 6.2 (L) 6.5 - 8.1 g/dL   Albumin 3.6 3.5 - 5.0 g/dL   AST 26 15 - 41 U/L   ALT 16 0 - 44 U/L   Alkaline Phosphatase 69 38 - 126 U/L   Total Bilirubin 0.5 <1.2 mg/dL   GFR, Estimated 32 (L) >60 mL/min    Comment: (NOTE) Calculated using  the CKD-EPI Creatinine Equation (2021)    Anion gap 10 5 - 15    Comment: Performed at [facility], [address] [place], [place] [address]  Magnesium     Status: None   Collection Time: 10/02/23  8:13 PM  Result Value Ref Range   Magnesium 2.0 1.7 - 2.4 mg/dL    Comment: Performed at [facility], [address], [place], [place] [address]  Brain natriuretic peptide     Status: None   Collection Time: 10/02/23  8:13 PM  Result Value Ref Range   B Natriuretic Peptide 69.5 0.0 - 100.0 pg/mL    Comment: Performed at [facility], [address], [place], [place] [address]  Troponin I (High Sensitivity)     Status: None   Collection Time: 10/02/23  8:13 PM  Result Value Ref Range   Troponin I (High Sensitivity) 6 <18 ng/L    Comment: (NOTE) Elevated high sensitivity troponin I (hsTnI) values and significant  changes across serial measurements may suggest ACS but many other  chronic and acute conditions are known to elevate hsTnI results.  Refer to the "Links" section for chest pain algorithms and additional  guidance. Performed at [facility], [address] [place], [place] [address]   TSH     Status: None   Collection Time: 10/02/23  8:13 PM  Result Value Ref Range   TSH 1.886 0.350 - 4.500 uIU/mL    Comment: Performed by a 3rd Generation assay with a functional sensitivity of <=0.01 uIU/mL. Performed at [facility], [address], [place], [place] [address]   Troponin I (High Sensitivity)     Status: None   Collection Time: 10/02/23 10:15 PM  Result Value Ref Range   Troponin I (High Sensitivity) 7 <18 ng/L    Comment: (NOTE) Elevated high sensitivity troponin I (hsTnI) values and  significant  changes across serial measurements may suggest ACS but many other  chronic and acute conditions are known to elevate hsTnI results.  Refer to the "Links" section for chest pain algorithms and additional  guidance. Performed at [facility], [address], [place], [place] [address]   Sodium, urine, random     Status: None   Collection Time: 10/02/23 10:39 PM  Result Value Ref Range   Sodium, Ur 31 mmol/L    Comment: Performed at [facility], [address], [place], [place] [address]  Creatinine, urine, random     Status: None   Collection Time: 10/02/23 10:39 PM  Result Value Ref Range   Creatinine, Urine 37 mg/dL    Comment: Performed at [facility], [address] [place], [place] [address]  HIV Antibody (routine testing w rflx)     Status: None   Collection Time: 10/03/23  5:19 AM  Result Value Ref Range   HIV Screen 4th Generation wRfx Non Reactive Non Reactive    Comment: Performed at [facility], [address] [place], [place] [address]  Basic metabolic panel     Status: Abnormal   Collection Time: 10/03/23  5:19 AM  Result Value Ref Range   Sodium 139 135 - 145 mmol/L   Potassium 4.0 3.5 - 5.1 mmol/L   Chloride 110 98 - 111 mmol/L   CO2 23 22 - 32 mmol/L   Glucose, Bld 97 70 - 99 mg/dL    Comment: Glucose reference range applies only to samples  taken after fasting for at least 8 hours.   BUN 20 8 - 23 mg/dL   Creatinine, Ser 8.11 (H) 0.44 - 1.00 mg/dL   Calcium 8.7 (L) 8.9 - 10.3 mg/dL   GFR, Estimated 39 (L) >60 mL/min    Comment: (NOTE) Calculated using the CKD-EPI Creatinine Equation (2021)    Anion gap 6 5 - 15    Comment: Performed at [facility], [address], [place], [place] [address]  CBC     Status: Abnormal   Collection Time: 10/03/23  5:19 AM  Result Value Ref Range   WBC 6.5 4.0 - 10.5 K/uL   RBC 3.85 (L) 3.87 - 5.11 MIL/uL   Hemoglobin 11.3 (L) 12.0 - 15.0 g/dL   HCT 29.5 62.1 - 30.8 %   MCV  94.3 80.0 - 100.0 fL   MCH 29.4 26.0 - 34.0 pg   MCHC 31.1 30.0 - 36.0 g/dL   RDW 65.7 84.6 - 96.2 %   Platelets 290 150 - 400 K/uL   nRBC 0.0 0.0 - 0.2 %    Comment: Performed at [facility], [address], [place], [place] [address]  CBG monitoring, ED     Status: Abnormal   Collection Time: 10/03/23  7:54 AM  Result Value Ref Range   Glucose-Capillary 123 (H) 70 - 99 mg/dL    Comment: Glucose reference range applies only to samples taken after fasting for at least 8 hours.   CT HEAD WO CONTRAST ( ) Result Date: 10/02/2023 CLINICAL DATA:  Bradycardia, hypotension, near syncope EXAM: CT HEAD WITHOUT CONTRAST TECHNIQUE: Contiguous axial images were obtained from the base of the skull through the vertex without intravenous contrast. RADIATION DOSE REDUCTION: This exam was performed according to the departmental dose-optimization program which includes automated exposure control, adjustment of the mA and/or kV according to patient size and/or use of iterative reconstruction technique. COMPARISON:  05/08/2022 FINDINGS: Brain: No evidence of acute infarction, hemorrhage, hydrocephalus, extra-axial collection or mass lesion/mass effect. Subcortical white matter and periventricular small vessel ischemic changes. Vascular: Intracranial atherosclerosis. Skull: Normal. Negative for fracture or focal lesion. Sinuses/Orbits: The visualized paranasal sinuses are essentially clear. The mastoid air cells are unopacified. Other: None. IMPRESSION: No acute intracranial abnormality.  Small vessel ischemic changes. Electronically Signed   By: Charline Bills M.D.   On: 10/02/2023 23:54   DG Chest Portable 1 View Result Date: 10/02/2023 CLINICAL DATA:  Hypotension. EXAM: PORTABLE CHEST 1 VIEW COMPARISON:  Chest radiograph dated July 10, 2023. FINDINGS: The heart size and mediastinal contours are within normal limits. Loop recorder device projecting over the left chest wall. Both lungs are clear. No  sizable pleural effusion or pneumothorax. No acute osseous abnormality. IMPRESSION: No acute cardiopulmonary findings. Electronically Signed   By: Hart Robinsons M.D.   On: 10/02/2023 21:01   US RENAL ARTERY DUPLEX COMPLETE Result Date: 10/02/2023 CLINICAL DATA:  Stage III A chronic kidney disease Hypertension EXAM: RENAL/URINARY TRACT ULTRASOUND RENAL DUPLEX DOPPLER ULTRASOUND COMPARISON:  None available FINDINGS: Right Kidney: Length: 9.1 cm. Echogenicity within normal limits. No mass or hydronephrosis visualized. Left Kidney: Length: 10.6 cm. Echogenicity within normal limits. No mass or hydronephrosis visualized. Bladder: Evaluation limited by underdistention. Grossly unremarkable. RENAL DUPLEX ULTRASOUND Right Renal Artery Velocities: Origin:  97 cm/sec Mid:  86 cm/sec Hilum:  92 cm/sec Interlobar:  85 cm/sec Arcuate:  40 cm/sec Left Renal Artery Velocities: Origin:  81 cm/sec Mid:  90 cm/sec Hilum:  78 cm/sec Interlobar:  62  cm/sec Arcuate:  52 cm/sec Aortic Velocity:  100 cm/sec Right Renal-Aortic Ratios: Origin: 1.0 Mid:  0.9 Hilum: 0.9 Interlobar: 0.9 Arcuate: 0.4 Left Renal-Aortic Ratios: Origin: 0.8 Mid: 0.9 Hilum: 0.8 Interlobar: 0.6 Arcuate: 0.5 IMPRESSION: 1. No significant grayscale abnormality of the kidneys. 2. No Doppler evidence of significant renal artery stenosis. Electronically Signed   By: Acquanetta Belling M.D.   On: 10/02/2023 10:02    Pending Labs Unresulted Labs (From admission, onward)     Start     Ordered   10/03/23 0030  Urea nitrogen, urine  Once,   AD        10/03/23 0029            Vitals/Pain Today's Vitals   10/03/23 0930 10/03/23 1000 10/03/23 1036 10/03/23 1130  BP: (!) 148/63 (!) 152/112  (!) 146/117  Pulse: (!) 58 66  63  Resp: 17 16  18   Temp:   98 F (36.7 C)   TempSrc:      SpO2: 100% 100%  100%  Weight:      Height:      PainSc:        Isolation Precautions No active isolations  Medications Medications  sodium chloride flush (NS) 0.9 %  injection 3 mL (3 mLs Intravenous Given 10/03/23 0914)  heparin injection 5,000 Units (5,000 Units Subcutaneous Given 10/03/23 0610)  acetaminophen (TYLENOL) tablet 650 mg (650 mg Oral Given 10/03/23 0646)    Or  acetaminophen (TYLENOL) suppository 650 mg ( Rectal See Alternative 10/03/23 0646)  ondansetron (ZOFRAN) tablet 4 mg (has no administration in time range)    Or  ondansetron (ZOFRAN) injection 4 mg (has no administration in time range)  senna-docusate (Senokot-S) tablet 1 tablet (has no administration in time range)  clopidogrel (PLAVIX) tablet 75 mg (75 mg Oral Given 10/03/23 0911)  rosuvastatin (CRESTOR) tablet 20 mg (20 mg Oral Given 10/03/23 0911)  pantoprazole (PROTONIX) EC tablet 40 mg (has no administration in time range)  venlafaxine XR (EFFEXOR-XR) 24 hr capsule 37.5 mg (has no administration in time range)  sodium chloride 0.9 % bolus 500 mL (0 mLs Intravenous Stopped 10/02/23 2111)  gabapentin (NEURONTIN) capsule 300 mg (300 mg Oral Given 10/03/23 0031)  sodium chloride 0.9 % bolus 500 mL (0 mLs Intravenous Stopped 10/03/23 0152)    Mobility walks     Focused Assessments    R Recommendations: See Admitting Provider Note  Report given to:   Additional Notes:

## 2023-10-03 NOTE — Plan of Care (Signed)

## 2023-10-03 NOTE — Progress Notes (Signed)
Patient Name: Debbie Hatfield Date of Encounter: 10/03/2023 [facility] Cardiologist: Norman Herrlich, MD   Interval Summary  .    Debbie Hatfield is admitted today with symptomatic hypotension, bradycardia, AKI  We have interrogated her loop recorder and she has not had any signficant arrhythmias that would explain presyncope  She has had poor oral intake for the past several days  Creatinine was 1.7 ( with baseline 1.0)  Received IV fluids overnight   Echo today  Normal LV EF of 60-65%. Grade I DD  Mild - mod LAE AV - normal  MV - no MS or MR    Vital Signs .    Vitals:   10/03/23 0430 10/03/23 0500 10/03/23 0540 10/03/23 0636  BP: 133/64 (!) 160/127 (!) 156/67 (!) 137/57  Pulse: (!) 52 (!) 53 63 81  Resp: 15 (!) 22 18 18   Temp:    97.7 F (36.5 C)  TempSrc:    Oral  SpO2: 99% 100% 100% 100%  Weight:      Height:        Intake/Output Summary (Last 24 hours) at 10/03/2023 0837 Last data filed at 10/02/2023 2114 Gross per 24 hour  Intake --  Output 50 ml  Net -50 ml      10/02/2023    7:35 PM 09/18/2023    9:00 AM 09/10/2023    2:24 PM  Last 3 Weights  Weight (lbs) 140 lb  146 lb 3.2 oz  Weight (kg) 63.504 kg  66.316 kg     Information is confidential and restricted. Go to Review Flowsheets to unlock data.      Telemetry/ECG    NSR , sinus bradycardia   - Personally Reviewed  Physical Exam .   GEN: No acute distress.   Neck: No JVD Cardiac: RRR, soft systolic murmur  Respiratory: Clear to auscultation bilaterally. GI: Soft, nontender, non-distended  MS: No edema  Assessment & Plan .     Debbie Hatfield is a 67 y.o. female with a hx of HTN, HLD, nonobstructive CAD, prior right MCA CVA, CKD, PUD, lupus, trigeminal neuralgia, and GERD who is being seen for the evaluation of bradycardia and presyncope at the request of Dr Rush Landmark.   Presyncope Sinus Bradycardia  Patient presented for evaluation of pre-syncope/hypotension with prodrome of fatigue  x1 week. In ED triage, BP 70/40 with HR 40. She was initially treated with epinephrine infusion, since turned off. Patient now borderline hypertensive this morning.   Hypotension/pre-syncope still suspicious as result of pre-renal AKI with creatinine up to 1.73 on admission (previous baseline around 1.0). Labs otherwise unremarkable including TSH.  ILR interrogation shows  - no pauses of any significance .   Echo looks great . Normal LV function.  No significant valvular disease   Hypertension  Patient recently seen in clinic by Dr. Dulce Sellar with notable hypertension and was started on Spironolactone 25mg . Aldosterone/renin and renal artery duplex ordered. This [place] was without significant renal artery stenosis. Renin/aldosterone not yet checked.   Her spell was generalized weakness, not true syncope   She had a very poor appetite for a week prior to this  She thinks its due to too many noncardiac meds  ( neurontin,  muscle relaxers , etc )  She would benefit from having a pharmacist review her med list  OK to be discharged to home from a cardiac standpoint         10/03/2023, 4:57 PM [address]  [address]  Office - [phone] Pager [phone]

## 2023-10-03 NOTE — Care Management CC44 (Signed)
Condition Code 44 Documentation Completed  Patient Details  Name: Debbie Hatfield MRN: [id number] Date of Birth: 1955-12-14   Condition Code 44 given:    Patient signature on Condition Code 44 notice:    Documentation of 2 MD's agreement:    Code 44 added to claim:     Patient gave permission to sign that she received notice, but she does not agree with it  Lockie Pares, RN 10/03/2023, 5:14 PM

## 2023-10-03 NOTE — Care Management (Addendum)
Transition of Care [facility]) - Inpatient Brief Assessment   Patient Details  Name: Debbie Hatfield MRN: [id number] Date of Birth: Nov 17, 1955  [facility] CM/SW Contact:    Lockie Pares, RN Phone Number: 10/03/2023, 5:16 PM   Clinical Narrative: Spoke with patient in regards to DC planning. She states she has no current needs. Medicare notice given, she wants to change to IP, discussed how this works. She will speak to her provider.    The patient will be discussed in daily progressive rounds, if a need is identified please place a TOC consult   Transition of Care Asessment: Insurance and Status: Insurance coverage has been reviewed Patient has primary care physician: Yes   Prior level of function:: Independent Prior/Current Home Services: No current home services Social Drivers of Health Review: SDOH reviewed no interventions necessary Readmission risk has been reviewed: Yes Transition of care needs: no transition of care needs at this time

## 2023-10-04 ENCOUNTER — Telehealth: Payer: Self-pay | Admitting: Cardiology

## 2023-10-04 LAB — UREA NITROGEN, URINE: Urea Nitrogen, Ur: 253 mg/dL

## 2023-10-04 NOTE — Telephone Encounter (Signed)
Patient is calling to see if she needs to see Dr. Dulce Sellar earlier base off her ER visit notes.  Please advise

## 2023-10-10 ENCOUNTER — Telehealth (HOSPITAL_COMMUNITY): Payer: Self-pay | Admitting: *Deleted

## 2023-10-10 NOTE — Telephone Encounter (Signed)
Pt called stating that she is experiencing increased depression related to recent overnight hospitalization (12/18-12/19) for medical issues. Pt is asking if you would increase the Effexor XR. She is currently taking 37.5 mg every day. Pt last visit was on 09/18/23 and she has a f/u scheduled for 12/17/23. Please review.

## 2023-10-16 NOTE — Progress Notes (Signed)
 Cardiology Office Note:    Date:  10/18/2023   ID:  Debbie Hatfield, DOB [birth year], MRN [id number]  PCP:  Joshua Debby CROME, MD  Cardiologist:  Redell Leiter, MD    Referring MD: Joshua Debby CROME, MD    ASSESSMENT:    1. Resistant hypertension   2. Sinus bradycardia   3. Syncope, unspecified syncope type   4. Hyperlipidemia LDL goal <100    PLAN:    In order of problems listed above:  Much improved with the addition of spironolactone  continue along with a current ARB follow-up trend home blood pressures at target Avoid beta-blockers asked her to purchase a smart watch to pair with her apple phone to self monitor at real-time and if she has significant bradycardia she could capture this Stable no recurrence she has an implanted loop recorder Continue her high intensity statin with stroke I asked her to establish a relationship with a cardiologist in [place] no longer go to the med [employer] and she request Dr. Sheena.   Next appointment: 3 months   Medication Adjustments/Labs and Tests Ordered: Current medicines are reviewed at length with the patient today.  Concerns regarding medicines are outlined above.  No orders of the defined types were placed in this encounter.  No orders of the defined types were placed in this encounter.    History of Present Illness:    Debbie Hatfield is a 68 y.o. 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  Last seen 08/31/2023. She had recent [facility] admission 10/02/2023 discharged the next day with symptomatic hypotension.  She also had mild bradycardia on presentation to the hospital.  Prior to that visit she had very difficult to control hypertension with intensification of her  medications.  Compliance with diet, lifestyle and medications: Yes  Since hospitalization she is much better and her blood pressure 130 systolic Certainly had a profound response to spironolactone  I suspect she has hyperaldosteronism we will avoid beta-blockers with her previous significant sinus bradycardia and I asked her to purchase a smart watch that she could trend her heart rates in real-time She is feeling much better she has some palpitation not severe or sustained no chest pain syncope edema shortness of breath He sees nephrology next week and I will go ahead and draw BMP today and also an aldosterone to renin level although she is on spironolactone  and ARB Continue her current antihypertensive medications Avoid rate slowing medications Past Medical History:  Diagnosis Date   AKI (acute kidney injury) (HCC) 10/03/2023   Closed fracture of right distal radius    Degeneration of lumbar intervertebral disc 01/26/2020   Depression with anxiety 10/03/2023   GAD (generalized anxiety disorder) 11/15/2021   Gastroparesis    GERD (gastroesophageal reflux disease)    History of CVA (cerebrovascular accident) 10/03/2023   Hyperlipidemia LDL goal <100 03/26/2022   The ASCVD Risk score (Arnett DK, et al., 2019) failed to calculate for the following reasons:    The valid HDL cholesterol range is 20 to 100 mg/dL     Hypertension    Hypotension 10/03/2023   Irritable bowel disease    Lupus    Major depression    Morton's neuroma of left foot 08/30/2023   Near syncope 10/03/2023   Need  for shingles vaccine 11/15/2021   Need for vaccination 11/15/2021   Nonrheumatic mitral valve regurgitation 05/10/2022   Osteopenia 12/14/2015   Peptic ulcer disease 12/14/2015   Recurrent major depressive disorder, in partial remission (HCC) 11/15/2021   Sinus bradycardia 10/03/2023   Stage 3a chronic kidney disease (HCC) 11/15/2021   Estimated Creatinine Clearance: 49.7 mL/min (by C-G formula based on  SCr of 1.01 mg/dL).      Estimated Creatinine Clearance: 45.9 mL/min (A) (by C-G formula based on SCr of 1.07 mg/dL (H)).      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   Stroke [facility])    Trigeminal neuralgia    Visit for screening mammogram 11/15/2021    Current Medications: Current Meds  Medication Sig   ACETAMINOPHEN  PO Take 1,300 mg by mouth in the morning and at bedtime.   acyclovir  (ZOVIRAX ) 400 MG tablet TAKE 1 TABLET(400 MG) BY MOUTH DAILY   clopidogrel  (PLAVIX ) 75 MG tablet One po qd   gabapentin  (NEURONTIN ) 300 MG capsule Take 600 mg by mouth 2 (two) times daily.   pantoprazole  (PROTONIX ) 40 MG tablet Take 1 tablet (40 mg total) by mouth 2 (two) times daily.   rosuvastatin  (CRESTOR ) 20 MG tablet Take 1 tablet (20 mg total) by mouth daily.   spironolactone  (ALDACTONE ) 25 MG tablet Take 1 tablet (25 mg total) by mouth daily.   telmisartan  (MICARDIS ) 40 MG tablet Take 1 tablet (40 mg total) by mouth daily.   tizanidine  (ZANAFLEX ) 2 MG capsule Take 4 mg by mouth at bedtime.   traMADol  (ULTRAM ) 50 MG tablet Take 50 mg by mouth every 4 (four) hours as needed.   venlafaxine  (EFFEXOR ) 37.5 MG tablet Take 37.5 mg by mouth 2 (two) times daily.   [DISCONTINUED] mirtazapine  (REMERON ) 15 MG tablet Take 15 mg by mouth at bedtime.      EKGs/Labs/Other Studies Reviewed:    The following studies were reviewed today:  Cardiac Studies & Procedures     STRESS TESTS  MYOCARDIAL PERFUSION IMAGING 06/12/2022  Narrative   The study is normal. The study is low risk.   No ST deviation was noted.   Left ventricular function is normal. Nuclear stress EF: 72 %. The left ventricular ejection fraction is hyperdynamic (>65%). End diastolic cavity size is normal.   Prior study not available for comparison.  ECHOCARDIOGRAM  ECHOCARDIOGRAM COMPLETE 10/03/2023  Narrative ECHOCARDIOGRAM REPORT    Patient Name:   Debbie Hatfield Date of Exam: 10/03/2023 Medical Rec #:  [id number]      Height:       62.0  in Accession #:    [id number]     Weight:       140.0 lb Date of Birth:  [birth year]       BSA:          1.643 m Patient Age:    68 years       BP:           111/93 mmHg Patient Gender: F              HR:           59 bpm. Exam Location:  Inpatient  Procedure: 2D Echo, 3D Echo, Cardiac Doppler and Color Doppler  Indications:    R55 Syncope  History:        Patient has prior history of Echocardiogram examinations, most recent 05/07/2022. Stroke, Signs/Symptoms:Hypotension; Risk Factors:Hypertension.  Sonographer:    Lanell Maduro Referring Phys: [id number] GEORGANNA ARCHER  IMPRESSIONS  1. Left ventricular ejection fraction, by estimation, is 60 to 65%. The left ventricle has normal function. The left ventricle has no regional wall motion abnormalities. There is mild left ventricular hypertrophy. Left ventricular diastolic parameters are consistent with Grade I diastolic dysfunction (impaired relaxation). 2. Right ventricular systolic function is normal. The right ventricular size is normal. Tricuspid regurgitation signal is inadequate for assessing PA pressure. 3. Left atrial size was mild to moderately dilated. 4. No evidence of mitral valve regurgitation. Moderate mitral annular calcification. 5. The aortic valve is tricuspid. Aortic valve regurgitation is not visualized. Aortic valve sclerosis is present, with no evidence of aortic valve stenosis. 6. The inferior vena cava is normal in size with greater than 50% respiratory variability, suggesting right atrial pressure of 3 mmHg.  FINDINGS Left Ventricle: Left ventricular ejection fraction, by estimation, is 60 to 65%. The left ventricle has normal function. The left ventricle has no regional wall motion abnormalities. The left ventricular internal cavity size was normal in size. There is mild left ventricular hypertrophy. Left ventricular diastolic parameters are consistent with Grade I diastolic dysfunction (impaired  relaxation).  Right Ventricle: The right ventricular size is normal. Right ventricular systolic function is normal. Tricuspid regurgitation signal is inadequate for assessing PA pressure.  Left Atrium: Left atrial size was mild to moderately dilated.  Right Atrium: Right atrial size was normal in size.  Pericardium: There is no evidence of pericardial effusion.  Mitral Valve: Moderate mitral annular calcification. No evidence of mitral valve regurgitation.  Tricuspid Valve: Tricuspid valve regurgitation is not demonstrated.  Aortic Valve: The aortic valve is tricuspid. Aortic valve regurgitation is not visualized. Aortic valve sclerosis is present, with no evidence of aortic valve stenosis. Aortic valve mean gradient measures 9.0 mmHg. Aortic valve peak gradient measures 16.0 mmHg. Aortic valve area, by VTI measures 1.36 cm.  Pulmonic Valve: Pulmonic valve regurgitation is trivial.  Aorta: The aortic root and ascending aorta are structurally normal, with no evidence of dilitation.  Venous: The inferior vena cava is normal in size with greater than 50% respiratory variability, suggesting right atrial pressure of 3 mmHg.  IAS/Shunts: No atrial level shunt detected by color flow Doppler.   LEFT VENTRICLE PLAX 2D LVIDd:         3.70 cm     Diastology LVIDs:         1.90 cm     LV e' medial:    7.62 cm/s LV PW:         0.80 cm     LV E/e' medial:  12.4 LV IVS:        1.10 cm     LV e' lateral:   8.16 cm/s LVOT diam:     1.90 cm     LV E/e' lateral: 11.6 LV SV:         69 LV SV Index:   42 LVOT Area:     2.84 cm  3D Volume EF: LV Volumes (MOD)           3D EF:        66 % LV vol d, MOD A2C: 57.0 ml LV EDV:       119 ml LV vol d, MOD A4C: 68.8 ml LV ESV:       40 ml LV vol s, MOD A2C: 20.7 ml LV SV:        79 ml LV vol s, MOD A4C: 19.4 ml LV SV MOD A2C:     36.3 ml  LV SV MOD A4C:     68.8 ml LV SV MOD BP:      42.6 ml  RIGHT VENTRICLE             IVC RV Basal diam:  3.30 cm      IVC diam: 1.90 cm RV S prime:     11.90 cm/s TAPSE (M-mode): 2.9 cm  LEFT ATRIUM             Index        RIGHT ATRIUM           Index LA diam:        3.60 cm 2.19 cm/m   RA Area:     14.20 cm LA Vol (A2C):   45.0 ml 27.39 ml/m  RA Volume:   31.10 ml  18.93 ml/m LA Vol (A4C):   62.6 ml 38.11 ml/m LA Biplane Vol: 57.9 ml 35.24 ml/m AORTIC VALVE AV Area (Vmax):    1.33 cm AV Area (Vmean):   1.45 cm AV Area (VTI):     1.36 cm AV Vmax:           200.00 cm/s AV Vmean:          136.000 cm/s AV VTI:            0.507 m AV Peak Grad:      16.0 mmHg AV Mean Grad:      9.0 mmHg LVOT Vmax:         93.80 cm/s LVOT Vmean:        69.400 cm/s LVOT VTI:          0.243 m LVOT/AV VTI ratio: 0.48  AORTA Ao Root diam: 2.50 cm Ao Asc diam:  3.20 cm  MITRAL VALVE MV Area (PHT): 3.97 cm     SHUNTS MV Decel Time: 191 msec     Systemic VTI:  0.24 m MV E velocity: 94.70 cm/s   Systemic Diam: 1.90 cm MV A velocity: 106.00 cm/s MV E/A ratio:  0.89  [place] signed by Ronal Ross Signature Date/Time: 10/03/2023/12:24:59 PM    Final  TEE  ECHO TEE 07/05/2022  Narrative TRANSESOPHOGEAL ECHO REPORT    Patient Name:   TYRISHA BENNINGER Date of Exam: 07/05/2022 Medical Rec #:  [id number]      Height:       62.0 in Accession #:    [id number]     Weight:       145.1 lb Date of Birth:  [birth year]       BSA:          1.668 m Patient Age:    66 years       BP:           164/72 mmHg Patient Gender: F              HR:           90 bpm. Exam Location:  Inpatient  Procedure: Transesophageal Echo, Color Doppler, Cardiac Doppler and Saline Contrast Bubble Study  Indications:     Stroke i63.9  History:         Patient has prior history of Echocardiogram examinations, most recent 05/07/2022. Risk Factors:Hypertension and Dyslipidemia.  Sonographer:     Damien Senior RDCS Referring Phys:  MONETTA ROGUE, J Diagnosing Phys: Soyla Merck MD  PROCEDURE: After discussion of  the risks and benefits of a TEE, an informed consent was obtained from the patient. TEE procedure time was 16  minutes. The transesophogeal probe was passed without difficulty through the esophogus of the patient. Imaged were obtained with the patient in a left lateral decubitus position. Local oropharyngeal anesthetic was provided with Cetacaine. Sedation performed by different physician. The patient was monitored while under deep sedation. Anesthestetic sedation was provided intravenously by Anesthesiology: 335mg  of Propofol . Image quality was excellent. The patient's vital signs; including heart rate, blood pressure, and oxygen saturation; remained stable throughout the procedure. The patient developed no complications during the procedure.  IMPRESSIONS   1. Left ventricular ejection fraction, by estimation, is 65 to 70%. The left ventricle has normal function. The left ventricle has no regional wall motion abnormalities. 2. Right ventricular systolic function is normal. The right ventricular size is normal. 3. Left atrial size was mildly dilated. No left atrial/left atrial appendage thrombus was detected. The LAA emptying velocity was 95 cm/s. 4. A small pericardial effusion is present. 5. The mitral valve is normal in structure. Trivial mitral valve regurgitation. No evidence of mitral stenosis. 6. The aortic valve is tricuspid. There is mild calcification of the aortic valve. Aortic valve regurgitation is trivial. Mild aortic valve stenosis. Aortic valve mean gradient measures 12.0 mmHg. 7. There is mild (Grade II) atheroma plaque involving the aortic arch and descending aorta. 8. Evidence of atrial level shunting detected by color flow Doppler. Agitated saline contrast bubble study was positive with shunting observed within 3-6 cardiac cycles suggestive of interatrial shunt. There is a small patent foramen ovale with bidirectional shunting across atrial septum.  FINDINGS Left Ventricle: Left  ventricular ejection fraction, by estimation, is 65 to 70%. The left ventricle has normal function. The left ventricle has no regional wall motion abnormalities. The left ventricular internal cavity size was normal in size.  Right Ventricle: The right ventricular size is normal. No increase in right ventricular wall thickness. Right ventricular systolic function is normal.  Left Atrium: Left atrial size was mildly dilated. No left atrial/left atrial appendage thrombus was detected. The LAA emptying velocity was 95 cm/s.  Right Atrium: Right atrial size was normal in size.  Pericardium: A small pericardial effusion is present.  Mitral Valve: The mitral valve is normal in structure. Trivial mitral valve regurgitation. No evidence of mitral valve stenosis.  Tricuspid Valve: The tricuspid valve is normal in structure. Tricuspid valve regurgitation is trivial. No evidence of tricuspid stenosis.  Aortic Valve: The aortic valve is tricuspid. There is mild calcification of the aortic valve. Aortic valve regurgitation is trivial. Mild aortic stenosis is present. Aortic valve mean gradient measures 12.0 mmHg. Aortic valve peak gradient measures 17.2 mmHg. Aortic valve area, by VTI measures 1.83 cm.  Pulmonic Valve: The pulmonic valve was normal in structure. Pulmonic valve regurgitation is trivial. No evidence of pulmonic stenosis.  Aorta: The aortic root and ascending aorta are structurally normal, with no evidence of dilitation. There is mild (Grade II) atheroma plaque involving the aortic arch and descending aorta.  IAS/Shunts: There is redundancy of the interatrial septum. Evidence of atrial level shunting detected by color flow Doppler. Agitated saline contrast was given intravenously to evaluate for intracardiac shunting. Agitated saline contrast bubble study was positive with shunting observed within 3-6 cardiac cycles suggestive of interatrial shunt. A small patent foramen ovale is detected with  bidirectional shunting across atrial septum.   LEFT VENTRICLE PLAX 2D LVOT diam:     2.00 cm LV SV:         78 LV SV Index:   47 LVOT Area:  3.14 cm   AORTIC VALVE AV Area (Vmax):    1.60 cm AV Area (Vmean):   1.70 cm AV Area (VTI):     1.83 cm AV Vmax:           207.50 cm/s AV Vmean:          156.000 cm/s AV VTI:            0.423 m AV Peak Grad:      17.2 mmHg AV Mean Grad:      12.0 mmHg LVOT Vmax:         106.00 cm/s LVOT Vmean:        84.600 cm/s LVOT VTI:          0.247 m LVOT/AV VTI ratio: 0.58  AORTA Ao Root diam: 3.00 cm Ao Asc diam:  3.00 cm   SHUNTS Systemic VTI:  0.25 m Systemic Diam: 2.00 cm  Soyla Merck MD Electronically signed by Soyla Merck MD Signature Date/Time: 07/05/2022/1:28:03 PM    Final  MONITORS  LONG TERM MONITOR-LIVE TELEMETRY (3-14 DAYS) 02/07/2021  Narrative Patch Wear Time:  14 days and 0 hours (2022-03-31T10:07:46-0400 to 2022-04-14T10:07:46-0400)  Patient had a min HR of 53 bpm, max HR of 185 bpm, and avg HR of 83 bpm. Predominant underlying rhythm was Sinus Rhythm. First Degree AV Block was present. 8 Supraventricular Tachycardia runs occurred, the run with the fastest interval lasting 5 beats with a max rate of 185 bpm, the longest lasting 9 beats with an avg rate of 129 bpm. Supraventricular Tachycardia was detected within +/- 45 seconds of symptomatic patient event(s). Isolated SVEs were rare (<1.0%), SVE Couplets were rare (<1.0%), and SVE Triplets were rare (<1.0%). Isolated VEs were rare (<1.0%, 1579), VE Couplets were rare (<1.0%, 1), and VE Triplets were rare (<1.0%, 1).  There were no pauses of 3 seconds or greater and no episodes of second or third-degree AV block.  Ventricular ectopy was rare with 1 couplet and 1 triplet.  Supraventricular ectopy was rare with no episodes of atrial fibrillation or flutter.  There were 8 brief runs of APCs, atrial tachycardia the longest 9 complexes rate of 129  bpm.  There were 19 triggered 11 diary events all associated with sinus rhythm sinus tachycardia.  None of the episodes were associated with occasional to frequent PVCs 3 the episodes with APCs.   Conclusion, first-degree AV block, rare ventricular and supraventricular ectopy however symptomatic events were predominantly associated with PVCs and APCs.  CT SCANS  CT CORONARY MORPH W/CTA COR W/SCORE 08/01/2023  Addendum 08/27/2023  9:47 PM ADDENDUM REPORT: 08/27/2023 21:44  EXAM: OVER-READ INTERPRETATION  CT CHEST  The following report is an over-read performed by radiologist Dr. Fonda [facility] on 08/27/2023. This over-read does not include interpretation of cardiac or coronary anatomy or pathology. The coronary CTA interpretation by the cardiologist is attached.  COMPARISON:  None.  FINDINGS: Cardiovascular:  See findings discussed in the body of the report.  Mediastinum/Nodes: No suspicious adenopathy identified. Imaged mediastinal structures are unremarkable.  Lungs/Pleura: Imaged lungs are clear. No pleural effusion or pneumothorax.  Upper Abdomen: No acute abnormality.  Musculoskeletal: No chest wall abnormality. No acute osseous findings.  IMPRESSION: No acute extracardiac incidental findings identified.   Electronically Signed By: Fonda Field M.D. On: 08/27/2023 21:44  Narrative CLINICAL DATA:  68 Year-old Female  EXAM: Cardiac/Coronary  CTA  TECHNIQUE: The patient was scanned on a [employer].  FINDINGS: Scan was triggered in the  descending thoracic aorta. Axial non-contrast 3 mm slices were carried out through the heart. The data set was analyzed on a dedicated work station and scored using the Agatson method. Gantry rotation speed was 250 msecs and collimation was .6 mm. 0.8 mg of sl NTG was given. The 3D data set was reconstructed in 5% intervals of the 67-82 % of the R-R cycle. Diastolic phases were  analyzed on a dedicated work station using MPR, MIP and VRT modes. The patient received 95 cc of contrast.  Coronary Arteries:  Normal coronary origin. Right dominance.  Coronary Calcium  Score:  Left anterior descending artery: 63  Total: 63  Percentile: 74th for age, sex, and race matched control.  Plaque Analysis: Unavailable  RCA is a large dominant artery that is not well seen distally. There is no significant plaque.  Left main is a large artery that gives rise to LAD, RI, and LCX arteries. There is no significant plaque.  LAD is a large vessel that gives rise to two diagonal vessels. Mild non-obstructive calcified plaque (25-49%) in the ostial LAD.  LCX is a non-dominant artery that gives rise to two OM branches. There is no significant plaque.  There is a ramus intermedius vessel with no significant plaque.  Other findings:  Aorta: Normal size.  Aortic atherosclerosis.  No dissection.  Main Pulmonary Artery: Normal size of the pulmonary artery.  Systemic Veins: Coronary sinus is markedly dilated, 18 mm. There is no left sided SVC seen. Difficult to assess for bridging vein or fistula due to artifact; neither are visualized.  Aortic Valve:  Tri-leaflet.  AV calcium  score 50.  Mitral valve: Mild mitral annular calcification  Normal pulmonary vein drainage into the left atrium.  Normal left atrial appendage without a thrombus.  Interatrial septum is grossly normal  Right Ventricle: Dilated size  Right Atrium: Dilated size  Pericardium: Normal thickness  Extra-cardiac findings: See attached radiology report for non-cardiac structures.  Artifact: Significant motion artifact, most affecting the RCA, and assessment of the coronary sinus  IMPRESSION: 1. Coronary calcium  score of 63. This was 74th percentile for age, sex, and race matched control.  2. Normal coronary origin with right dominance.  3. CAD-RADS 2. Mild non-obstructive CAD (25-49%).  Consider non-atherosclerotic causes of chest pain. Consider preventive therapy and risk factor modification.  4. Coronary sinus dilation. Consider TEE or Cardiac MRI if clinically indicated.  RECOMMENDATIONS: RECOMMENDATIONS The proposed cut-off value of 1,651 AU yielded a 93 % sensitivity and 75 % specificity in grading AS severity in patients with classical low-flow, low-gradient AS. Proposed different cut-off values to define severe AS for men and women as 2,065 AU and 1,274 AU, respectively. The joint European and American recommendations for the assessment of AS consider the aortic valve calcium  score as a continuum - a very high calcium  score suggests severe AS and a low calcium  score suggests severe AS is unlikely.  Donney VEAR Jarome LULLA Stephen RENETTE, et al. 2017 ESC/EACTS Guidelines for the management of valvular heart disease. Eur Heart J 2017;38:2739-91.  Coronary artery calcium  (CAC) score is a strong predictor of incident coronary heart disease (CHD) and provides predictive information beyond traditional risk factors. CAC scoring is reasonable to use in the decision to withhold, postpone, or initiate statin therapy in intermediate-risk or selected borderline-risk asymptomatic adults (age 37-75 years and LDL-C >=70 to <190 mg/dL) who do not have diabetes or established atherosclerotic cardiovascular disease (ASCVD).* In intermediate-risk (10-year ASCVD risk >=7.5% to <20%) adults or selected borderline-risk (10-year  ASCVD risk >=5% to <7.5%) adults in whom a CAC score is measured for the purpose of making a treatment decision the following recommendations have been made:  If CAC = 0, it is reasonable to withhold statin therapy and reassess in 5 to 10 years, as long as higher risk conditions are absent (diabetes mellitus, family history of premature CHD in first degree relatives (males <55 years; females <65 years), cigarette smoking, LDL >=190 mg/dL or other independent  risk factors).  If CAC is 1 to 99, it is reasonable to initiate statin therapy for patients >=64 years of age.  If CAC is >=100 or >=75th percentile, it is reasonable to initiate statin therapy at any age.  Cardiology referral should be considered for patients with CAC scores =400 or >=75th percentile.  *2018 AHA/ACC/AACVPR/AAPA/ABC/ACPM/ADA/AGS/APhA/ASPC/NLA/PCNA Guideline on the Management of Blood Cholesterol: A Report of the American College of Cardiology/American Heart Association Task Force on Clinical Practice Guidelines. J Am Coll Cardiol. 2019;73(24):3168-3209.  Stanly Leavens, MD  Electronically Signed: By: Stanly Leavens M.D. On: 08/01/2023 20:14              Recent Labs: 10/02/2023: ALT 16; B Natriuretic Peptide 69.5; Magnesium  2.0; TSH 1.886 10/03/2023: BUN 20; Creatinine, Ser 1.46; Hemoglobin 11.3; Platelets 290; Potassium 4.0; Sodium 139  Recent Lipid Panel    Component Value Date/Time   CHOL 164 12/25/2022 1047   TRIG 84 12/25/2022 1047   HDL 103 12/25/2022 1047   CHOLHDL 1.6 12/25/2022 1047   CHOLHDL 2.2 05/08/2022 0424   VLDL 24 05/08/2022 0424   LDLCALC 46 12/25/2022 1047    Physical Exam:    VS:  BP (!) 174/78   Pulse (!) 54   Ht 5' 2 (1.575 m)   Wt 142 lb 12.8 oz (64.8 kg)   SpO2 99%   BMI 26.12 kg/m     Wt Readings from Last 3 Encounters:  10/18/23 142 lb 12.8 oz (64.8 kg)  10/03/23 140 lb 3.2 oz (63.6 kg)  09/10/23 146 lb 3.2 oz (66.3 kg)     GEN:  Well nourished, well developed in no acute distress HEENT: Normal NECK: No JVD; No carotid bruits LYMPHATICS: No lymphadenopathy CARDIAC: RRR, no murmurs, rubs, gallops RESPIRATORY:  Clear to auscultation without rales, wheezing or rhonchi  ABDOMEN: Soft, non-tender, non-distended MUSCULOSKELETAL:  No edema; No deformity  SKIN: Warm and dry NEUROLOGIC:  Alert and oriented x 3 PSYCHIATRIC:  Normal affect    Signed, Redell Leiter, MD  10/18/2023 10:40 AM    [facility]

## 2023-10-17 ENCOUNTER — Encounter: Payer: Self-pay | Admitting: Cardiology

## 2023-10-18 ENCOUNTER — Ambulatory Visit: Payer: Medicare Other | Attending: Cardiology | Admitting: Cardiology

## 2023-10-18 ENCOUNTER — Encounter: Payer: Self-pay | Admitting: Cardiology

## 2023-10-18 VITALS — BP 174/78 | HR 54 | Ht 62.0 in | Wt 142.8 lb

## 2023-10-18 DIAGNOSIS — R55 Syncope and collapse: Secondary | ICD-10-CM | POA: Diagnosis not present

## 2023-10-18 DIAGNOSIS — I1A0 Resistant hypertension: Secondary | ICD-10-CM | POA: Diagnosis present

## 2023-10-18 DIAGNOSIS — R001 Bradycardia, unspecified: Secondary | ICD-10-CM | POA: Diagnosis not present

## 2023-10-18 DIAGNOSIS — E785 Hyperlipidemia, unspecified: Secondary | ICD-10-CM | POA: Diagnosis not present

## 2023-10-18 NOTE — Patient Instructions (Signed)
 Take an ECG with the ECG app on Apple Watch You can take an electrocardiogram (ECG) with the ECG app.  What is an ECG An electrocardiogram (also called an ECG or EKG) is a test that records the timing and strength of the electrical signals that make the heart beat. By looking at an ECG, a doctor can gain insights about your heart rhythm and look for irregularities.  How to use the ECG app  How to read ECG details  View and Share Health information  Get the best results  Things you should know  How the ECG app  How to use the ECG app The ECG app can record your heartbeat and rhythm using the electrical heart sensor on Apple Watch Series 4 or later and all models of Apple Watch Ultra* and then check the recording for atrial fibrillation (AFib), a form of irregular rhythm.  The ECG app records an electrocardiogram which represents the electrical pulses that make your heart beat. The ECG app checks these pulses to get your heart rate and see if the upper and lower chambers of your heart are in rhythm. If they're out of rhythm, that could be AFib.  Here's what you need Make sure that the ECG app is available in your country or region. Learn where the ECG app is available.  Update your iPhone to the latest version of iOS and Apple Watch to the latest version of watchOS.  The ECG app is not intended for use by people under 13 years old.  Install and set up the ECG app The ECG app   is installed during the ECG app setup in the Health app. Follow these steps to set up the ECG app:  Open the Health app on your iPhone.  Follow the onscreen steps. If you don't see a prompt to set up, tap the Browse tab, then tap Heart > Electrocardiograms (ECG) > Set Up ECG App.  [id number]-heart-browse-ecg-results-summary After you complete set up, open the ECG app   to take an ECG.  If you still don't see the app on your Apple Watch, open the Watch App on your iPhone and tap Heart. In the ECG  section, tap Install to install the ECG app.  Take an ECG You can take an ECG at any time, when you're feeling symptoms such as a rapid or skipped heartbeat, when you have other general concerns about your heart health, or when you receive an irregular rhythm notification.  [id number]-take-ecg-animation Make sure that your Apple Watch is snug and on the wrist that you selected in the Apple Watch app. To check, open the Apple Watch app, tap the My Watch tab, then go to General > Watch Orientation.  Open the ECG app   on your Apple Watch.  Rest your arms on a table or in your lap.  With the hand opposite your watch, hold your finger on the Digital Crown. You don't need to press the Digital Crown during the session.  Wait. The recording takes 30 seconds. At the end of the recording, you will receive a classification, then you can tap Add Symptoms and choose your symptoms.  Tap Save to note any symptoms, then tap Done.  How to read results After a successful reading, you will receive one of the following type of results on your ECG app. Regardless of the result, if you aren't feeling well or are experiencing any symptoms, you should talk to your doctor.  heart-condition-icon-sinus-rhythm Sinus rhythm A sinus rhythm  result means the heart is beating in a uniform pattern between 50 and 100 BPM. This happens when the upper and lower chambers of the heart are beating in sync. A sinus rhythm result only applies to that particular recording and doesn't mean your heart beats with a consistent pattern all the time. It also does not mean that you're healthy. If you're not feeling well or are feeling any symptoms, you should talk to your doctor.  heart-condition-icon-afib Atrial fibrillation An AFib result means the heart is beating in an irregular pattern. AFib is the most common form of serious arrhythmia, or irregular heart rhythm. If you receive an AFib classification and  you have not been diagnosed with AFib, you should talk to your doctor. The ECG app ver. 1 can check for AFib between 50 and 120 BPM. The ECG app ver. 2 can check for AFib between 50 and 150 BPM.  Learn more about ECG app version availability and find out which version you're using.  heart-condition-icon-bradycardia Low or high heart rate A heart rate under 50 BPM or over 120 BPM in ECG version 1 affects the ECG app's ability to check for AFib. In ECG version 2, a heart rate under 50 BPM or over 150 BPM can affect the ECG app's ability to check for AFib.  A heart rate can be low because of certain medications or if electrical signals are not properly conducted through the heart. Training to be an art gallery manager can also lead to a low heart rate.  A high heart rate could be due to exercise, stress, nervousness, alcohol, dehydration, infection, AFib, or other arrhythmia.  heart-condition-icon-inconclusive Inconclusive An inconclusive result means the recording can't be classified. It could be due to one of the following situations:  In ECG version 1, your heart rate is between 100 and 120 BPM and you are not in AFib.  You have a pacemaker or implantable cardioverter defibrillator (ICD).  The recording may show signs of other arrhythmias or heart conditions that the app is not designed to recognize.  Certain physiological conditions may prevent a small percentage of users from creating enough signal to produce a good recording.  For ECG version 1, you might also get an inconclusive result if you are not resting your arms on a table during a recording, or wearing your Apple Watch too loose. Learn how to get the best results.  Poor Recording This classification is unique to ECG version 2. Poor Recording means the result can't be classified. If you get a Poor Recording result, there are a few things you can try in order to get a better recording.  Rest your arms on a table or in your lap while  you take a recording. Try to relax and not move too much.  Make sure that your Apple Watch isn't loose on your wrist. The band should be snug and the back of your Apple Watch needs to be touching your wrist.  Make sure that your wrist and your Apple Watch are clean and dry. Water and sweat can cause a poor recording.  Make sure that your Apple Watch is on the wrist you selected in Settings.  Move away from any electronics that are plugged into an outlet to avoid electrical interference.  View and share your Health information The ECG waveform, its associated classifications, and any noted symptoms will be saved in the Health app on your iPhone or iPad. You can also share a PDF with your doctor.  [id number]-heart-browse-ecg-results-summary Open  the Health app on your iPhone or iPad.  If you're on your iPhone, tap Browse. If you're on your iPad, tap to open the sidebar.  Tap Heart, then tap Electrocardiograms (ECG).  Tap the chart for your ECG result.  Tap Export a PDF for Therapist, Sports.  Tap the Share button   to print or share the PDF.  How to get the best results Rest your arms on a table or in your lap while you take a recording. Try to relax and not move too much.  Make sure that your Apple Watch isn't loose on your wrist. The band should be snug, and the back of your Apple Watch needs to be touching your wrist.  Make sure that your wrist and your Apple Watch are clean and dry.  Make sure that your Apple Watch is on the wrist that you selected in the Apple Watch app. To check, open the Apple Watch app, tap the My Watch tab, then go to General > Watch Orientation.  Move away from any electronics that are plugged into an outlet to avoid electrical interference.  A small percentage of people may have certain physiological conditions preventing the creation of enough signal to produce a good recording -- for example, the positioning of the heart in the chest can change the  electrical signal levels, which could impact the ECG app's ability to obtain a measurement.  Liquid-free contact is required for the ECG app to work properly. Use of the ECG app may be impacted if the Apple Watch and/or skin aren't entirely dry. Make sure that your wrist and hands are thoroughly dry before attempting a reading. To ensure the best reading after swimming, showering, heavy perspiration, or washing your hands, clean and dry your Apple Watch. It may take one hour or more for your Apple Watch to completely dry.  Things you should know The ECG app cannot detect a heart attack. If you ever experience chest pain, pressure, tightness, or what you think is a heart attack, call emergency services immediately.  The ECG app cannot detect blood clots or a stroke.  The ECG app cannot detect other heart-related conditions. These include high blood pressure, congestive heart failure, high cholesterol, or other forms of arrhythmia.  If you're not feeling well or are feeling any symptoms, talk to your doctor or seek immediate medical attention.  How the ECG app works The ECG app on [employer] 4 or later and all models of Apple Watch Ultra generates an ECG that is similar to a single-lead (or Lead I) ECG. In a doctor's office, a standard 12-lead ECG is usually taken. This 12-lead ECG records electrical signals from different angles in the heart to produce twelve different waveforms. The ECG app on Apple Watch measures a waveform similar to one of those twelve waveforms. A single-lead ECG is able to provide information about heart rate and heart rhythm and enables classification of AFib. However, a single-lead ECG cannot be used to identify some other conditions, like heart attacks. Single-lead ECGs are often prescribed by doctors for people to wear at home or within the hospital so that the doctor can get a better look at the underlying rate and rhythm of the heart. However, the ECG app on Apple  Watch Series 4 or later and all models of Apple Watch Ultra allows you to generate an ECG similar to a single-lead ECG without a prescription from your doctor.  In studies comparing the ECG app on Apple  Watch to a standard 12-lead ECG taken at the same time, there was agreement between the ECG app classification of the rhythm as sinus or AFib compared to the standard 12-lead ECG.  The ability of the ECG app to accurately classify an ECG recording into AFib and sinus rhythm was tested in a clinical trial of approximately 600 subjects, and demonstrated 99.6% specificity with respect to sinus rhythm classification and 98.3% sensitivity for AFib classification for the classifiable results.  The clinical validation results reflect use in a controlled environment. Real world use of the ECG app may result in a greater number of strips being deemed inconclusive and not classifiable.  Step One- Record your EKG strip on Apple watch.   Step two- On Apple EKG click "Download"   Step three- It will prompt you to make a password for this EKG. Please make the password "[email or web address]" so that we can view it.   Step four- Click on the little "upload" button (small box with an arrow in the middle) in the bottom left-hand corner of the screen.   Step five- Click "Save to Files"  Step six- Click on "On my iphone" and then "Pages" then press save in the top right-hand corner.   NOW GO TO MYCHART   Once on MyChart click "Messages"  Step one- Click "Send a message"  Step two- Click "Ask a medical question"   Step three- Click "Non urgent medical question"   Step four- Click on [facility] name.  Step five- Click on the small paperclip at the bottom of the screen  Step six- Click "Choose file"  Step seven- Pick the most recent EKG strip listed.   Once uploaded send the message!   Medication Instructions:  Your physician recommends that you continue on your current medications as directed. Please refer  to the Current Medication list given to you today.  *If you need a refill on your cardiac medications before your next appointment, please call your pharmacy*   Lab Work: Your physician recommends that you have a BMP, Aldosterone/Renin level today in the office.  If you have labs (blood work) drawn today and your tests are completely normal, you will receive your results only by: MyChart Message (if you have MyChart) OR A paper copy in the mail If you have any lab test that is abnormal or we need to change your treatment, we will call you to review the results.   Testing/Procedures: None ordered   Follow-Up: At [facility], you and your health needs are our priority.  As part of our continuing mission to provide you with exceptional heart care, we have created designated Provider Care Teams.  These Care Teams include your primary Cardiologist (physician) and Advanced Practice Providers (APPs -  Physician Assistants and Nurse Practitioners) who all work together to provide you with the care you need, when you need it.  We recommend signing up for the patient portal called MyChart.  Sign up information is provided on this After Visit Summary.  MyChart is used to connect with patients for Virtual Visits (Telemedicine).  Patients are able to view lab/test results, encounter notes, upcoming appointments, etc.  Non-urgent messages can be sent to your provider as well.   To learn more about what you can do with MyChart, go to [email or web address].    Your next appointment:   3 month   The format for your next appointment:   In Person  Provider:   Kardie Tobb, MD  Other Instructions  none  Important Information About Sugar

## 2023-10-18 NOTE — Telephone Encounter (Signed)
 Pt was seen in the office 1/3

## 2023-10-21 ENCOUNTER — Encounter: Payer: Self-pay | Admitting: Cardiology

## 2023-10-22 ENCOUNTER — Other Ambulatory Visit: Payer: Self-pay

## 2023-10-22 ENCOUNTER — Encounter (HOSPITAL_COMMUNITY): Payer: Self-pay | Admitting: Family

## 2023-10-22 ENCOUNTER — Ambulatory Visit (HOSPITAL_BASED_OUTPATIENT_CLINIC_OR_DEPARTMENT_OTHER): Payer: Medicare Other | Admitting: Family

## 2023-10-22 VITALS — BP 193/71 | HR 68 | Ht 62.0 in | Wt 145.0 lb

## 2023-10-22 DIAGNOSIS — F3341 Major depressive disorder, recurrent, in partial remission: Secondary | ICD-10-CM | POA: Diagnosis not present

## 2023-10-22 DIAGNOSIS — F411 Generalized anxiety disorder: Secondary | ICD-10-CM | POA: Diagnosis not present

## 2023-10-22 MED ORDER — VENLAFAXINE HCL 37.5 MG PO TABS: 37.5000 mg | ORAL_TABLET | Freq: Two times a day (BID) | ORAL | 1 refills | Status: DC

## 2023-10-22 NOTE — Progress Notes (Addendum)
 [facility] MD/PA/NP OP Progress Note  10/22/2023 4:03 PM Debbie Hatfield  MRN:  [id number]  Chief Complaint: Debbie Hatfield reported  mood fluctuations due to recent emergency room visit due to blood pressure issues.   HPI: Debbie Hatfield was seen and evaluated face-to-face.  She reports increased anxiety related to medical history/diagnoses. Reports she has a follow-up with cardiology and nephrology within the next upcoming few weeks.  States she recenlty had a overnight visit at the local emergency department due to low heart rate and elevated blood pressure.  States she continues to have ongoing ruminations and symptoms of worry related to symptoms and diagnoses. States she was diagnosed with first-degree heart block and is awaiting results.    Currently she is prescribed Effexor  37.5 twice daily.  She reports she has been taking and tolerating medications well.  Overall, states her mood has stabilized.  Reports mainly during nighttime hours she has increased anxiety due to muscle spasms and night terrors.  Patient was initiated on Remeron  15 mg which she reports taking only 1 dose.  States she is unsure why she has not followed up with additional medications at this point.   Debbie Hatfield to continue Effexor  3 7.5 twice daily, discussed utilizing hydroxyzine  10 mg as needed nightly as she reports taking muscle relaxant tizanidine  2 mg nightly.  Will discontinue mirtazapine  at this time.  Discussed consideration for Minipress for night terrors however, would like patient to follow-up with therapy services.  She was receptive to plan.  Patient reports she thought she was initiated on Rexulti during her hospitalization however is unable to find prescription through MyChart.   Major depressive disorder recurrent: Generalized anxiety disorder: Sleep disturbance:   Continue Effexor  37.5 mg daily Discontinue Remeron  15 mg nightly -Report taken Tizanidine  2  mg nightly for muscle spam's -f/u with KYM Ricker for therapy  services as patient continues to express concerns related to night terrors and sleep disturbance.    Follow-up 1 months for medication management  Debbie Hatfield reports occupying her time by painting and by numbers and completing crafts activity's.  Reports she resides with her elderly mother who has been overly affectionate, to which she reports can be irritating. Patient reported that her mother's to caring, but it can be overwhelming at times.  Reports a fair appetite.      Follow-up 1 months for medication management  Visit Diagnosis:    ICD-10-CM   1. GAD (generalized anxiety disorder)  F41.1     2. Recurrent major depressive disorder, in partial remission (HCC)  F33.41       Past Psychiatric History: See chart  Past Medical History:  Past Medical History:  Diagnosis Date   AKI (acute kidney injury) (HCC) 10/03/2023   Closed fracture of right distal radius    Degeneration of lumbar intervertebral disc 01/26/2020   Depression with anxiety 10/03/2023   GAD (generalized anxiety disorder) 11/15/2021   Gastroparesis    GERD (gastroesophageal reflux disease)    History of CVA (cerebrovascular accident) 10/03/2023   Hyperlipidemia LDL goal <100 03/26/2022   The ASCVD Risk score (Arnett DK, et al., 2019) failed to calculate for the following reasons:    The valid HDL cholesterol range is 20 to 100 mg/dL     Hypertension    Hypotension 10/03/2023   Irritable bowel disease    Lupus    Major depression    Morton's neuroma of left foot 08/30/2023   Near syncope 10/03/2023   Need for shingles vaccine 11/15/2021  Need for vaccination 11/15/2021   Nonrheumatic mitral valve regurgitation 05/10/2022   Osteopenia 12/14/2015   Peptic ulcer disease 12/14/2015   Recurrent major depressive disorder, in partial remission (HCC) 11/15/2021   Sinus bradycardia 10/03/2023   Stage 3a chronic kidney disease (HCC) 11/15/2021   Estimated Creatinine Clearance: 49.7 mL/min (by C-G formula based on SCr  of 1.01 mg/dL).      Estimated Creatinine Clearance: 45.9 mL/min (A) (by C-G formula based on SCr of 1.07 mg/dL (H)).      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   Stroke [facility])    Trigeminal neuralgia    Visit for screening mammogram 11/15/2021    Past Surgical History:  Procedure Laterality Date   BUBBLE STUDY  07/05/2022   Procedure: BUBBLE STUDY;  Surgeon: Loni Soyla LABOR, MD;  Location: [facility] ENDOSCOPY;  Service: Cardiology;;   CARPAL TUNNEL RELEASE Left 05/15/2023   CHOLECYSTECTOMY     COLONOSCOPY     LOOP RECORDER IMPLANT  2021   ORIF WRIST FRACTURE Right 09/22/2020   Procedure: OPEN REDUCTION INTERNAL FIXATION (ORIF) WRIST FRACTURE;  Surgeon: Carolee Lynwood JINNY DOUGLAS, MD;  Location: [facility];  Service: Orthopedics;  Laterality: Right;    TEE WITHOUT CARDIOVERSION N/A 07/05/2022   Procedure: TRANSESOPHAGEAL ECHOCARDIOGRAM (TEE);  Surgeon: Loni Soyla LABOR, MD;  Location: [facility] ENDOSCOPY;  Service: Cardiology;  Laterality: N/A;    Family Psychiatric History:   Family History:  Family History  Problem Relation Age of Onset   Heart block Mother        Pacemaker   Hypertension Mother    Depression Mother    Gout Mother    Macular degeneration Mother    Aneurysm Mother    Alcoholism Father    Congestive Heart Failure Maternal Grandmother    Diabetes Maternal Grandmother    Congestive Heart Failure Maternal Grandfather    Colon cancer Maternal Grandfather    Stroke Paternal Grandfather    Hypertension Paternal Grandfather    Esophageal cancer Neg Hx    Stomach cancer Neg Hx    Rectal cancer Neg Hx     Social History:  Social History   Socioeconomic History   Marital status: Widowed    Spouse name: Not on file   Number of children: 0   Years of education: Not on file   Highest education level: Bachelor's degree (e.g., BA, AB, BS)  Occupational History    Comment: RN ortho/neuro at [employer]  Tobacco Use   Smoking status: Never    Passive exposure:  Never   Smokeless tobacco: Never  Vaping Use   Vaping status: Never Used  Substance and Sexual Activity   Alcohol use: Yes    Comment: social   Drug use: Never   Sexual activity: Not Currently    Birth control/protection: Post-menopausal  Other Topics Concern   Not on file  Social History Narrative   06/13/22 living with her mother   2 [place] Dew per Week   Social Drivers of Health   Financial Resource Strain: Medium Risk (07/27/2023)   Overall Financial Resource Strain (CARDIA)    Difficulty of Paying Living Expenses: Somewhat hard  Food Insecurity: No Food Insecurity (10/03/2023)   Hunger Vital Sign    Worried About Running Out of Food in the Last Year: Never true    Ran Out of Food in the Last Year: Never true  Transportation Needs: No Transportation Needs (10/03/2023)   PRAPARE - Administrator, Civil Service (Medical): No  Lack of Transportation (Non-Medical): No  Physical Activity: Sufficiently Active (07/27/2023)   Exercise Vital Sign    Days of Exercise per Week: 4 days    Minutes of Exercise per Session: 40 min  Stress: Stress Concern Present (07/27/2023)   [employer] of Occupational Health - Occupational Stress Questionnaire    Feeling of Stress : Very much  Social Connections: Moderately Isolated (07/27/2023)   Social Connection and Isolation Panel [NHANES]    Frequency of Communication with Friends and Family: Three times a week    Frequency of Social Gatherings with Friends and Family: Twice a week    Attends Religious Services: Never    Database Administrator or Organizations: No    Attends Engineer, Structural: More than 4 times per year    Marital Status: Widowed    Allergies:  Allergies  Allergen Reactions   Plaquenil  [Hydroxychloroquine ]     Retinal damage   Sulfa Antibiotics Nausea And Vomiting   Sulfamethoxazole-Trimethoprim Nausea And Vomiting   Penicillins Rash    Metabolic Disorder Labs: Lab Results   Component Value Date   HGBA1C 5.7 (H) 05/08/2022   MPG 116.89 05/08/2022   No results found for: PROLACTIN Lab Results  Component Value Date   CHOL 164 12/25/2022   TRIG 84 12/25/2022   HDL 103 12/25/2022   CHOLHDL 1.6 12/25/2022   VLDL 24 05/08/2022   LDLCALC 46 12/25/2022   LDLCALC 105 (H) 05/08/2022   Lab Results  Component Value Date   TSH 1.886 10/02/2023   TSH 2.11 01/21/2023    Therapeutic Level Labs: No results found for: LITHIUM No results found for: VALPROATE Lab Results  Component Value Date   CBMZ <2.0 (L) 07/29/2023    Current Medications: Current Outpatient Medications  Medication Sig Dispense Refill   ACETAMINOPHEN  PO Take 1,300 mg by mouth in the morning and at bedtime.     acyclovir  (ZOVIRAX ) 400 MG tablet TAKE 1 TABLET(400 MG) BY MOUTH DAILY 90 tablet 1   baclofen  (LIORESAL ) 10 MG tablet Take 10 mg by mouth daily as needed for muscle spasms (As tolerated).     clopidogrel  (PLAVIX ) 75 MG tablet One po qd 90 tablet 4   gabapentin  (NEURONTIN ) 300 MG capsule Take 600 mg by mouth 2 (two) times daily.     pantoprazole  (PROTONIX ) 40 MG tablet Take 1 tablet (40 mg total) by mouth 2 (two) times daily. 90 tablet 3   rosuvastatin  (CRESTOR ) 20 MG tablet Take 1 tablet (20 mg total) by mouth daily. 90 tablet 0   spironolactone  (ALDACTONE ) 25 MG tablet Take 1 tablet (25 mg total) by mouth daily. 90 tablet 3   telmisartan  (MICARDIS ) 40 MG tablet Take 1 tablet (40 mg total) by mouth daily. 30 tablet 3   tizanidine  (ZANAFLEX ) 2 MG capsule Take 4 mg by mouth at bedtime.     traMADol  (ULTRAM ) 50 MG tablet Take 50 mg by mouth every 4 (four) hours as needed.     venlafaxine  (EFFEXOR ) 37.5 MG tablet Take 37.5 mg by mouth 2 (two) times daily.     No current facility-administered medications for this visit.     Musculoskeletal: Strength & Muscle Tone: within normal limits Gait & Station: normal Patient leans: N/A  Psychiatric Specialty Exam: Review of Systems   Psychiatric/Behavioral:  Positive for decreased concentration and sleep disturbance. The patient is nervous/anxious.   All other systems reviewed and are negative.   Blood pressure (!) 193/71, pulse 68, height 5' 2 (1.575  m), weight 145 lb (65.8 kg).Body mass index is 26.52 kg/m.  General Appearance: Casual  Eye Contact:  Good  Speech:  Clear and Coherent  Volume:  Normal  Mood:  Anxious and Depressed  Affect:  Congruent  Thought Process:  Coherent  Orientation:  Full (Time, Place, and Person)  Thought Content: Logical   Suicidal Thoughts:  No  Homicidal Thoughts:  No  Memory:  Immediate;   Good Remote;   Good  Judgement:  Good  Insight:  Good  Psychomotor Activity:  Normal  Concentration:  Concentration: Good  Recall:  Good  Fund of Knowledge: Good  Language: Good  Akathisia:  No  Handed:  Right  AIMS (if indicated): done  Assets:  Communication Skills Desire for Improvement Resilience Social Support  ADL's:  Intact  Cognition: WNL  Sleep:  Good   Screenings: GAD-7    Flowsheet Row Office Visit from 11/15/2021 in [facility]  Total GAD-7 Score 2      PHQ2-9    Flowsheet Row Office Visit from 08/28/2023 in [facility] Clinical Support from 10/24/2022 in [facility] [place] Office Visit from 10/22/2022 in [facility] [place] Office Visit from 05/10/2022 in [facility] Office Visit from 03/26/2022 in [facility]  PHQ-2 Total Score 5 0 0 4 1  PHQ-9 Total Score 12 8 8 16 6       Flowsheet Row ED to Hosp-Admission (Discharged) from 10/02/2023 in [place] [facility] ED to Hosp-Admission (Discharged) from 05/07/2022 in [facility] NEURO/TRAUMA/SURGICAL ICU ED from 01/07/2021 in [facility] Emergency Department at [facility]  C-SSRS RISK CATEGORY No Risk No Risk No  Risk        Assessment and Plan: Debbie Hatfield 68 year old female presents for medication management follow-up appointment.  Reports mood irritability and increased depression due to not taking her medications consistently while hospitalized coupled with ongoing symptoms of worry and ruminations.  Reports taking Zanaflex  2 to 4 mg nightly which caused sedation.  Discontinued Remeron  due to reported taking muscle relaxant.  Continue current Effexor  37.5 mg twice daily.  Consideration for taking hydroxyzine  10 mg nightly.  She was receptive to plan to follow-up 1 month for medication management.   Major depressive disorder recurrent: Generalized anxiety disorder: Sleep disturbance:   Continue Effexor  37.5 mg twice daily Discontinue Remeron  15 mg nightly Report taken Tizanidine  2  mg nightly for muscle spam's -f/u with KYM Ricker for therapy services as patient continues to express concerns related to night terrors and sleep disturbance.     Follow-up 1 months for medication management  Collaboration of Care: Collaboration of Care:   Patient/Guardian was advised Release of Information must be obtained prior to any record release in order to collaborate their care with an outside provider. Patient/Guardian was advised if they have not already done so to contact the registration department to sign all necessary forms in order for us  to release information regarding their care.   Consent: Patient/Guardian gives verbal consent for treatment and assignment of benefits for services provided during this visit. Patient/Guardian expressed understanding and agreed to proceed.    Staci LOISE Kerns, NP 10/22/2023, 4:04 PM

## 2023-10-23 NOTE — Progress Notes (Deleted)
Office Visit Note  Patient: Debbie Hatfield             Date of Birth: December 24, 1955           MRN: [id number]             PCP: Debbie Grandchild, MD Referring: Debbie Grandchild, MD Visit Date: 11/06/2023 Occupation: @GUAROCC @  Subjective:  No chief complaint on file.   History of Present Illness: Debbie Hatfield is a 68 y.o. female ***     Activities of Daily Living:  Patient reports morning stiffness for *** {minute/hour:19697}.   Patient {ACTIONS;DENIES/REPORTS:21021675::"Denies"} nocturnal pain.  Difficulty dressing/grooming: {ACTIONS;DENIES/REPORTS:21021675::"Denies"} Difficulty climbing stairs: {ACTIONS;DENIES/REPORTS:21021675::"Denies"} Difficulty getting out of chair: {ACTIONS;DENIES/REPORTS:21021675::"Denies"} Difficulty using hands for taps, buttons, cutlery, and/or writing: {ACTIONS;DENIES/REPORTS:21021675::"Denies"}  No Rheumatology ROS completed.   PMFS History:  Patient Active Problem List   Diagnosis Date Noted   Near syncope 10/03/2023   Hypotension 10/03/2023   AKI (acute kidney injury) (HCC) 10/03/2023   History of CVA (cerebrovascular accident) 10/03/2023   Depression with anxiety 10/03/2023   Sinus bradycardia 10/03/2023   Morton's neuroma of left foot 08/30/2023   Nonrheumatic mitral valve regurgitation 05/10/2022   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   Hyperlipidemia LDL goal <100 03/26/2022   Recurrent major depressive disorder, in partial remission (HCC) 11/15/2021   GAD (generalized anxiety disorder) 11/15/2021   Stage 3a chronic kidney disease (HCC) 11/15/2021   Trigeminal neuralgia    Lupus    Irritable bowel disease    GERD (gastroesophageal reflux disease)    Hypertension    Gastroparesis    Degeneration of lumbar intervertebral disc 01/26/2020   Osteopenia 12/14/2015   Peptic ulcer disease 12/14/2015    Past Medical History:  Diagnosis Date   AKI (acute kidney injury) (HCC) 10/03/2023   Closed fracture of right distal radius     Degeneration of lumbar intervertebral disc 01/26/2020   Depression with anxiety 10/03/2023   GAD (generalized anxiety disorder) 11/15/2021   Gastroparesis    GERD (gastroesophageal reflux disease)    History of CVA (cerebrovascular accident) 10/03/2023   Hyperlipidemia LDL goal <100 03/26/2022   The ASCVD Risk score (Arnett DK, et al., 2019) failed to calculate for the following reasons:    The valid HDL cholesterol range is 20 to 100 mg/dL     Hypertension    Hypotension 10/03/2023   Irritable bowel disease    Lupus    Major depression    Morton's neuroma of left foot 08/30/2023   Near syncope 10/03/2023   Need for shingles vaccine 11/15/2021   Need for vaccination 11/15/2021   Nonrheumatic mitral valve regurgitation 05/10/2022   Osteopenia 12/14/2015   Peptic ulcer disease 12/14/2015   Recurrent major depressive disorder, in partial remission (HCC) 11/15/2021   Sinus bradycardia 10/03/2023   Stage 3a chronic kidney disease (HCC) 11/15/2021   Estimated Creatinine Clearance: 49.7 mL/min (by C-G formula based on SCr of 1.01 mg/dL).      Estimated Creatinine Clearance: 45.9 mL/min (A) (by C-G formula based on SCr of 1.07 mg/dL (H)).      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   Stroke [facility])    Trigeminal neuralgia    Visit for screening mammogram 11/15/2021    Family History  Problem Relation Age of Onset   Heart block Mother        Pacemaker   Hypertension Mother    Depression Mother    Gout Mother    Macular degeneration Mother    Aneurysm  Mother    Alcoholism Father    Congestive Heart Failure Maternal Grandmother    Diabetes Maternal Grandmother    Congestive Heart Failure Maternal Grandfather    Colon cancer Maternal Grandfather    Stroke Paternal Grandfather    Hypertension Paternal Grandfather    Esophageal cancer Neg Hx    Stomach cancer Neg Hx    Rectal cancer Neg Hx    Past Surgical History:  Procedure Laterality Date   BUBBLE STUDY  07/05/2022   Procedure:  BUBBLE STUDY;  Surgeon: Parke Poisson, MD;  Location: [facility] ENDOSCOPY;  Service: Cardiology;;   CARPAL TUNNEL RELEASE Left 05/15/2023   CHOLECYSTECTOMY     COLONOSCOPY     LOOP RECORDER IMPLANT  2021   ORIF WRIST FRACTURE Right 09/22/2020   Procedure: OPEN REDUCTION INTERNAL FIXATION (ORIF) WRIST FRACTURE;  Surgeon: Ernest Mallick, MD;  Location: [facility];  Service: Orthopedics;  Laterality: Right;    TEE WITHOUT CARDIOVERSION N/A 07/05/2022   Procedure: TRANSESOPHAGEAL ECHOCARDIOGRAM (TEE);  Surgeon: Parke Poisson, MD;  Location[facility] ENDOSCOPY;  Service: Cardiology;  Laterality: N/A;   Social History   Social History Narrative   06/13/22 living with her mother   2 [employer] per [employer] History  Administered Date(s) Administered   Influenza,inj,Quad PF,6-35 Mos 06/17/2019   Influenza-Unspecified 09/02/2020, 07/27/2021, 07/15/2022   Moderna SARS-COV2 Booster Vaccination 09/02/2020   Moderna Sars-Covid-2 Vaccination 10/23/2019, 11/20/2019   PNEUMOCOCCAL CONJUGATE-20 11/15/2021   Tdap 03/28/2014, 12/29/2020     Objective: Vital Signs: There were no vitals taken for this visit.   Physical Exam   Musculoskeletal Exam: ***  CDAI Exam: CDAI Score: -- Patient Global: --; Provider Global: -- Swollen: --; Tender: -- Joint Exam 11/06/2023   No joint exam has been documented for this visit   There is currently no information documented on the homunculus. Go to the Rheumatology activity and complete the homunculus joint exam.  Investigation: No additional findings.  Imaging: ECHOCARDIOGRAM COMPLETE Result Date: 10/03/2023    ECHOCARDIOGRAM REPORT   Patient Name:   Debbie Hatfield Date of Exam: 10/03/2023 Medical Rec #:  [id number]      Height:       62.0 in Accession #:    [id number]     Weight:       140.0 lb Date of Birth:  November 24, 1955       BSA:          1.643 m Patient Age:    67 years       BP:           111/93 mmHg Patient  Gender: F              HR:           59 bpm. Exam Location:  Inpatient Procedure: 2D Echo, 3D Echo, Cardiac Doppler and Color Doppler Indications:    R55 Syncope  History:        Patient has prior history of Echocardiogram examinations, most                 recent 05/07/2022. Stroke, Signs/Symptoms:Hypotension; Risk                 Factors:Hypertension.  Sonographer:    Webb Laws Referring Phys: [id number] LONNIE SULLIVAN IMPRESSIONS  1. Left ventricular ejection fraction, by estimation, is 60 to 65%. The left ventricle has normal function. The left ventricle has no regional wall motion abnormalities. There  is mild left ventricular hypertrophy. Left ventricular diastolic parameters are consistent with Grade I diastolic dysfunction (impaired relaxation).  2. Right ventricular systolic function is normal. The right ventricular size is normal. Tricuspid regurgitation signal is inadequate for assessing PA pressure.  3. Left atrial size was mild to moderately dilated.  4. No evidence of mitral valve regurgitation. Moderate mitral annular calcification.  5. The aortic valve is tricuspid. Aortic valve regurgitation is not visualized. Aortic valve sclerosis is present, with no evidence of aortic valve stenosis.  6. The inferior vena cava is normal in size with greater than 50% respiratory variability, suggesting right atrial pressure of 3 mmHg. FINDINGS  Left Ventricle: Left ventricular ejection fraction, by estimation, is 60 to 65%. The left ventricle has normal function. The left ventricle has no regional wall motion abnormalities. The left ventricular internal cavity size was normal in size. There is  mild left ventricular hypertrophy. Left ventricular diastolic parameters are consistent with Grade I diastolic dysfunction (impaired relaxation). Right Ventricle: The right ventricular size is normal. Right ventricular systolic function is normal. Tricuspid regurgitation signal is inadequate for assessing PA pressure.  Left Atrium: Left atrial size was mild to moderately dilated. Right Atrium: Right atrial size was normal in size. Pericardium: There is no evidence of pericardial effusion. Mitral Valve: Moderate mitral annular calcification. No evidence of mitral valve regurgitation. Tricuspid Valve: Tricuspid valve regurgitation is not demonstrated. Aortic Valve: The aortic valve is tricuspid. Aortic valve regurgitation is not visualized. Aortic valve sclerosis is present, with no evidence of aortic valve stenosis. Aortic valve mean gradient measures 9.0 mmHg. Aortic valve peak gradient measures 16.0 mmHg. Aortic valve area, by VTI measures 1.36 cm. Pulmonic Valve: Pulmonic valve regurgitation is trivial. Aorta: The aortic root and ascending aorta are structurally normal, with no evidence of dilitation. Venous: The inferior vena cava is normal in size with greater than 50% respiratory variability, suggesting right atrial pressure of 3 mmHg. IAS/Shunts: No atrial level shunt detected by color flow Doppler.  LEFT VENTRICLE PLAX 2D LVIDd:         3.70 cm     Diastology LVIDs:         1.90 cm     LV e' medial:    7.62 cm/s LV PW:         0.80 cm     LV E/e' medial:  12.4 LV IVS:        1.10 cm     LV e' lateral:   8.16 cm/s LVOT diam:     1.90 cm     LV E/e' lateral: 11.6 LV SV:         69 LV SV Index:   42 LVOT Area:     2.84 cm                             3D Volume EF: LV Volumes (MOD)           3D EF:        66 % LV vol d, MOD A2C: 57.0 ml LV EDV:       119 ml LV vol d, MOD A4C: 68.8 ml LV ESV:       40 ml LV vol s, MOD A2C: 20.7 ml LV SV:        79 ml LV vol s, MOD A4C: 19.4 ml LV SV MOD A2C:     36.3 ml LV SV MOD A4C:  68.8 ml LV SV MOD BP:      42.6 ml RIGHT VENTRICLE             IVC RV Basal diam:  3.30 cm     IVC diam: 1.90 cm RV S prime:     11.90 cm/s TAPSE (M-mode): 2.9 cm LEFT ATRIUM             Index        RIGHT ATRIUM           Index LA diam:        3.60 cm 2.19 cm/m   RA Area:     14.20 cm LA Vol (A2C):   45.0  ml 27.39 ml/m  RA Volume:   31.10 ml  18.93 ml/m LA Vol (A4C):   62.6 ml 38.11 ml/m LA Biplane Vol: 57.9 ml 35.24 ml/m  AORTIC VALVE AV Area (Vmax):    1.33 cm AV Area (Vmean):   1.45 cm AV Area (VTI):     1.36 cm AV Vmax:           200.00 cm/s AV Vmean:          136.000 cm/s AV VTI:            0.507 m AV Peak Grad:      16.0 mmHg AV Mean Grad:      9.0 mmHg LVOT Vmax:         93.80 cm/s LVOT Vmean:        69.400 cm/s LVOT VTI:          0.243 m LVOT/AV VTI ratio: 0.48  AORTA Ao Root diam: 2.50 cm Ao Asc diam:  3.20 cm MITRAL VALVE MV Area (PHT): 3.97 cm     SHUNTS MV Decel Time: 191 msec     Systemic VTI:  0.24 m MV E velocity: 94.70 cm/s   Systemic Diam: 1.90 cm MV A velocity: 106.00 cm/s MV E/A ratio:  0.89 Photographer signed by Carolan Clines Signature Date/Time: 10/03/2023/12:24:59 PM    Final    CT HEAD WO CONTRAST ( ) Result Date: 10/02/2023 CLINICAL DATA:  Bradycardia, hypotension, near syncope EXAM: CT HEAD WITHOUT CONTRAST TECHNIQUE: Contiguous axial images were obtained from the base of the skull through the vertex without intravenous contrast. RADIATION DOSE REDUCTION: This exam was performed according to the departmental dose-optimization program which includes automated exposure control, adjustment of the mA and/or kV according to patient size and/or use of iterative reconstruction technique. COMPARISON:  05/08/2022 FINDINGS: Brain: No evidence of acute infarction, hemorrhage, hydrocephalus, extra-axial collection or mass lesion/mass effect. Subcortical white matter and periventricular small vessel ischemic changes. Vascular: Intracranial atherosclerosis. Skull: Normal. Negative for fracture or focal lesion. Sinuses/Orbits: The visualized paranasal sinuses are essentially clear. The mastoid air cells are unopacified. Other: None. IMPRESSION: No acute intracranial abnormality.  Small vessel ischemic changes. Electronically Signed   By: Charline Bills M.D.   On: 10/02/2023  23:54   DG Chest Portable 1 View Result Date: 10/02/2023 CLINICAL DATA:  Hypotension. EXAM: PORTABLE CHEST 1 VIEW COMPARISON:  Chest radiograph dated July 10, 2023. FINDINGS: The heart size and mediastinal contours are within normal limits. Loop recorder device projecting over the left chest wall. Both lungs are clear. No sizable pleural effusion or pneumothorax. No acute osseous abnormality. IMPRESSION: No acute cardiopulmonary findings. Electronically Signed   By: Hart Robinsons M.D.   On: 10/02/2023 21:01   US RENAL ARTERY DUPLEX COMPLETE Result Date: 10/02/2023 CLINICAL DATA:  Stage III A chronic kidney disease Hypertension EXAM: RENAL/URINARY TRACT ULTRASOUND RENAL DUPLEX DOPPLER ULTRASOUND COMPARISON:  None available FINDINGS: Right Kidney: Length: 9.1 cm. Echogenicity within normal limits. No mass or hydronephrosis visualized. Left Kidney: Length: 10.6 cm. Echogenicity within normal limits. No mass or hydronephrosis visualized. Bladder: Evaluation limited by underdistention. Grossly unremarkable. RENAL DUPLEX ULTRASOUND Right Renal Artery Velocities: Origin:  97 cm/sec Mid:  86 cm/sec Hilum:  92 cm/sec Interlobar:  85 cm/sec Arcuate:  40 cm/sec Left Renal Artery Velocities: Origin:  81 cm/sec Mid:  90 cm/sec Hilum:  78 cm/sec Interlobar:  62 cm/sec Arcuate:  52 cm/sec Aortic Velocity:  100 cm/sec Right Renal-Aortic Ratios: Origin: 1.0 Mid:  0.9 Hilum: 0.9 Interlobar: 0.9 Arcuate: 0.4 Left Renal-Aortic Ratios: Origin: 0.8 Mid: 0.9 Hilum: 0.8 Interlobar: 0.6 Arcuate: 0.5 IMPRESSION: 1. No significant grayscale abnormality of the kidneys. 2. No Doppler evidence of significant renal artery stenosis. Electronically Signed   By: Acquanetta Belling M.D.   On: 10/02/2023 10:02    Recent Labs: Lab Results  Component Value Date   WBC 6.5 10/03/2023   HGB 11.3 (L) 10/03/2023   PLT 290 10/03/2023   NA 139 10/03/2023   K 4.0 10/03/2023   CL 110 10/03/2023   CO2 23 10/03/2023   GLUCOSE 97 10/03/2023    BUN 20 10/03/2023   CREATININE 1.46 (H) 10/03/2023   BILITOT 0.5 10/02/2023   ALKPHOS 69 10/02/2023   AST 26 10/02/2023   ALT 16 10/02/2023   PROT 6.2 (L) 10/02/2023   ALBUMIN 3.6 10/02/2023   CALCIUM 8.7 (L) 10/03/2023   GFRAA >90 07/07/2012   QFTBGOLDPLUS NEGATIVE 06/22/2022    Speciality Comments: Ocular toxicity from Plaquenil use MTX 6 tablets p.o. 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  Pain in both hands  Pain in both feet  Cerebrovascular accident (CVA) due to thrombosis of right middle cerebral artery (HCC)  Degeneration of intervertebral disc of lumbar region without discogenic back pain or lower extremity pain  Trochanteric bursitis, right hip  Chronic pain of both knees  Primary hypertension  Nonrheumatic mitral valve regurgitation  Myalgia  History of osteopenia  Stage 3a chronic kidney disease (HCC)  Hyperlipidemia LDL goal <100  Gastroparesis  History of gastroesophageal reflux (GERD)  History of IBS  Peptic ulcer disease  Trigeminal neuralgia  GAD (generalized anxiety disorder)  Recurrent major depressive disorder, in partial remission (HCC)  Orders: No orders of the defined types were placed in this encounter.  No orders of the defined types were placed in this encounter.   Face-to-face time spent with patient was *** minutes. Greater than 50% of time was spent in counseling and coordination of care.  Follow-Up Instructions: No follow-ups on file.   Gearldine Bienenstock, PA-C  Note - This record has been created using Dragon software.  Chart creation  errors have been sought, but may not always  have been located. Such creation errors do not reflect on  the standard of medical care.

## 2023-10-25 ENCOUNTER — Ambulatory Visit: Payer: Medicare Other

## 2023-10-25 DIAGNOSIS — R55 Syncope and collapse: Secondary | ICD-10-CM

## 2023-10-25 LAB — BASIC METABOLIC PANEL
BUN/Creatinine Ratio: 13 (ref 12–28)
BUN: 17 mg/dL (ref 8–27)
CO2: 23 mmol/L (ref 20–29)
Calcium: 9.5 mg/dL (ref 8.7–10.3)
Chloride: 103 mmol/L (ref 96–106)
Creatinine, Ser: 1.34 mg/dL — ABNORMAL HIGH (ref 0.57–1.00)
Glucose: 92 mg/dL (ref 70–99)
Potassium: 4.9 mmol/L (ref 3.5–5.2)
Sodium: 140 mmol/L (ref 134–144)
eGFR: 43 mL/min/{1.73_m2} — ABNORMAL LOW (ref >59)

## 2023-10-25 LAB — ALDOSTERONE + RENIN ACTIVITY W/ RATIO
Aldos/Renin Ratio: 3.7 (ref 0.0–30.0)
Aldosterone: 6.8 ng/dL (ref 0.0–30.0)
Renin Activity, Plasma: 1.828 ng/mL/h (ref 0.167–5.380)

## 2023-10-25 LAB — CUP PACEART REMOTE DEVICE CHECK
Date Time Interrogation Session: 20250109230516
Implantable Pulse Generator Implant Date: 20220728

## 2023-10-29 ENCOUNTER — Ambulatory Visit: Payer: Medicare Other | Admitting: Internal Medicine

## 2023-10-29 ENCOUNTER — Encounter: Payer: Self-pay | Admitting: Internal Medicine

## 2023-10-29 VITALS — BP 146/78 | HR 59 | Temp 98.1°F | Resp 16 | Ht 62.0 in | Wt 144.0 lb

## 2023-10-29 DIAGNOSIS — I10 Essential (primary) hypertension: Secondary | ICD-10-CM

## 2023-10-29 DIAGNOSIS — E785 Hyperlipidemia, unspecified: Secondary | ICD-10-CM | POA: Diagnosis not present

## 2023-10-29 DIAGNOSIS — D539 Nutritional anemia, unspecified: Secondary | ICD-10-CM | POA: Insufficient documentation

## 2023-10-29 LAB — CBC WITH DIFFERENTIAL/PLATELET
Basophils Absolute: 0 10*3/uL (ref 0.0–0.1)
Basophils Relative: 0.5 % (ref 0.0–3.0)
Eosinophils Absolute: 0.1 10*3/uL (ref 0.0–0.7)
Eosinophils Relative: 2.1 % (ref 0.0–5.0)
HCT: 36.2 % (ref 36.0–46.0)
Hemoglobin: 11.3 g/dL — ABNORMAL LOW (ref 12.0–15.0)
Lymphocytes Relative: 32 % (ref 12.0–46.0)
Lymphs Abs: 1.7 10*3/uL (ref 0.7–4.0)
MCHC: 31.3 g/dL (ref 30.0–36.0)
MCV: 94.9 fL (ref 78.0–100.0)
Monocytes Absolute: 0.7 10*3/uL (ref 0.1–1.0)
Monocytes Relative: 13.6 % — ABNORMAL HIGH (ref 3.0–12.0)
Neutro Abs: 2.7 10*3/uL (ref 1.4–7.7)
Neutrophils Relative %: 51.8 % (ref 43.0–77.0)
Platelets: 295 10*3/uL (ref 150.0–400.0)
RBC: 3.82 Mil/uL — ABNORMAL LOW (ref 3.87–5.11)
RDW: 14.1 % (ref 11.5–15.5)
WBC: 5.2 10*3/uL (ref 4.0–10.5)

## 2023-10-29 LAB — VITAMIN B12: Vitamin B-12: 1264 pg/mL — ABNORMAL HIGH (ref 211–911)

## 2023-10-29 LAB — FOLATE: Folate: 18.6 ng/mL (ref >5.9)

## 2023-10-29 NOTE — Patient Instructions (Signed)

## 2023-10-29 NOTE — Progress Notes (Signed)
 Subjective:  Patient ID: Debbie Hatfield, female    DOB: 11/04/1955  Age: 68 y.o. MRN: [id number]  CC: Anemia and Hypertension   HPI Debbie Hatfield presents for f/up   ----  Discussed the use of AI scribe software for clinical note transcription with the patient, who gave verbal consent to proceed.  History of Present Illness   The patient, with a history of hypertension, anemia, and a recent viral illness, was admitted to the hospital due to hypotension and bradycardia. She reported a blood pressure of 70/40 and a heart rate of 40, which persisted for three hours, prompting her to seek emergency care. During the hospital stay, she was started on telmisartan  without HCTZ and spironolactone  due to a creatinine level of 1.75.  The patient denies experiencing any symptoms such as headaches, blurred vision, chest pain, shortness of breath, coughing, wheezing, or swelling. She also reported recovery from the illness in December that led to dehydration. She denies any recent changes in appetite or bowel habits.  Regarding her anemia, the patient reports a long-standing history with hemoglobin levels typically around 11. She denies taking any vitamin supplements, including iron. She had a normal colonoscopy in September, with a polyp removed. She also had an EGD due to esophagitis and GERD, during which biopsies were taken and a gastric polyp was removed. She is currently on Protonix  (pantoprazole ) BID for esophageal healing.  The patient also reports taking tramadol  and Tylenol  for pain management, but denies the use of any anti-inflammatory medications such as ibuprofen or Aleve. She had been off her triglyceride medication, Lasceva, for about three months due to prescription issues but has been back on it for the past three weeks.       Outpatient Medications Prior to Visit  Medication Sig Dispense Refill   ACETAMINOPHEN  PO Take 1,300 mg by mouth in the morning and at bedtime.     acyclovir   (ZOVIRAX ) 400 MG tablet TAKE 1 TABLET(400 MG) BY MOUTH DAILY 90 tablet 1   clopidogrel  (PLAVIX ) 75 MG tablet One po qd 90 tablet 4   gabapentin  (NEURONTIN ) 300 MG capsule Take 600 mg by mouth 2 (two) times daily.     pantoprazole  (PROTONIX ) 40 MG tablet Take 1 tablet (40 mg total) by mouth 2 (two) times daily. 90 tablet 3   spironolactone  (ALDACTONE ) 25 MG tablet Take 1 tablet (25 mg total) by mouth daily. 90 tablet 3   tizanidine  (ZANAFLEX ) 2 MG capsule Take 4 mg by mouth at bedtime.     traMADol  (ULTRAM ) 50 MG tablet Take 50 mg by mouth every 4 (four) hours as needed.     venlafaxine  (EFFEXOR ) 37.5 MG tablet Take 1 tablet (37.5 mg total) by mouth 2 (two) times daily. 180 tablet 1   rosuvastatin  (CRESTOR ) 20 MG tablet Take 1 tablet (20 mg total) by mouth daily. 90 tablet 0   telmisartan  (MICARDIS ) 40 MG tablet Take 1 tablet (40 mg total) by mouth daily. (Patient not taking: Reported on 10/29/2023) 30 tablet 3   baclofen  (LIORESAL ) 10 MG tablet Take 10 mg by mouth daily as needed for muscle spasms (As tolerated).     No facility-administered medications prior to visit.    ROS Review of Systems  Constitutional:  Negative for chills, diaphoresis, fatigue and fever.  HENT: Negative.    Eyes: Negative.   Respiratory: Negative.  Negative for cough, chest tightness, shortness of breath and wheezing.   Cardiovascular:  Negative for chest pain, palpitations and leg  swelling.  Gastrointestinal:  Negative for abdominal pain, constipation, diarrhea, nausea and vomiting.  Genitourinary: Negative.  Negative for difficulty urinating and dysuria.  Musculoskeletal:  Positive for arthralgias. Negative for myalgias and neck stiffness.  Skin: Negative.  Negative for color change and pallor.  Neurological: Negative.  Negative for dizziness, weakness, light-headedness and numbness.  Hematological:  Negative for adenopathy. Does not bruise/bleed easily.  Psychiatric/Behavioral: Negative.      Objective:  BP  (!) 146/78 (BP Location: Right Arm, Patient Position: Sitting, Cuff Size: Normal)   Pulse (!) 59   Temp 98.1 F (36.7 C) (Oral)   Resp 16   Ht 5' 2 (1.575 m)   Wt 144 lb (65.3 kg)   SpO2 100%   BMI 26.34 kg/m   BP Readings from Last 3 Encounters:  10/29/23 (!) 146/78  10/18/23 (!) 174/78  10/03/23 (!) 146/117    Wt Readings from Last 3 Encounters:  10/29/23 144 lb (65.3 kg)  10/18/23 142 lb 12.8 oz (64.8 kg)  10/03/23 140 lb 3.2 oz (63.6 kg)    Physical Exam Vitals reviewed.  HENT:     Mouth/Throat:     Mouth: Mucous membranes are moist.  Eyes:     General: No scleral icterus.    Conjunctiva/sclera: Conjunctivae normal.  Cardiovascular:     Rate and Rhythm: Normal rate and regular rhythm.     Heart sounds: Murmur heard.     Systolic murmur is present with a grade of 2/6.  Pulmonary:     Breath sounds: No stridor. No wheezing, rhonchi or rales.  Abdominal:     Palpations: There is no mass.     Tenderness: There is no abdominal tenderness. There is no guarding.     Hernia: No hernia is present.  Musculoskeletal:        General: Normal range of motion.     Cervical back: Neck supple.     Right lower leg: No edema.     Left lower leg: No edema.  Skin:    General: Skin is warm and dry.  Neurological:     General: No focal deficit present.     Mental Status: She is alert. Mental status is at baseline.  Psychiatric:        Mood and Affect: Mood normal.        Behavior: Behavior normal.     Lab Results  Component Value Date   WBC 5.2 10/29/2023   HGB 11.3 (L) 10/29/2023   HCT 36.2 10/29/2023   PLT 295.0 10/29/2023   GLUCOSE 78 10/29/2023   CHOL 164 12/25/2022   TRIG 84 12/25/2022   HDL 103 12/25/2022   LDLCALC 46 12/25/2022   ALT 16 10/02/2023   AST 26 10/02/2023   NA 141 10/29/2023   K 5.1 10/29/2023   CL 105 10/29/2023   CREATININE 1.33 (H) 10/29/2023   BUN 25 (H) 10/29/2023   CO2 28 10/29/2023   TSH 1.886 10/02/2023   INR 1.1 (H) 07/10/2023    HGBA1C 5.7 (H) 05/08/2022    ECHOCARDIOGRAM COMPLETE Result Date: 10/03/2023    ECHOCARDIOGRAM REPORT   Patient Name:   Debbie Hatfield Date of Exam: 10/03/2023 Medical Rec #:  [id number]      Height:       62.0 in Accession #:    [id number]     Weight:       140.0 lb Date of Birth:  12-25-1955       BSA:  1.643 m Patient Age:    67 years       BP:           111/93 mmHg Patient Gender: F              HR:           59 bpm. Exam Location:  Inpatient Procedure: 2D Echo, 3D Echo, Cardiac Doppler and Color Doppler Indications:    R55 Syncope  History:        Patient has prior history of Echocardiogram examinations, most                 recent 05/07/2022. Stroke, Signs/Symptoms:Hypotension; Risk                 Factors:Hypertension.  Sonographer:    Lanell Maduro Referring Phys: [id number] LONNIE SULLIVAN IMPRESSIONS  1. Left ventricular ejection fraction, by estimation, is 60 to 65%. The left ventricle has normal function. The left ventricle has no regional wall motion abnormalities. There is mild left ventricular hypertrophy. Left ventricular diastolic parameters are consistent with Grade I diastolic dysfunction (impaired relaxation).  2. Right ventricular systolic function is normal. The right ventricular size is normal. Tricuspid regurgitation signal is inadequate for assessing PA pressure.  3. Left atrial size was mild to moderately dilated.  4. No evidence of mitral valve regurgitation. Moderate mitral annular calcification.  5. The aortic valve is tricuspid. Aortic valve regurgitation is not visualized. Aortic valve sclerosis is present, with no evidence of aortic valve stenosis.  6. The inferior vena cava is normal in size with greater than 50% respiratory variability, suggesting right atrial pressure of 3 mmHg. FINDINGS  Left Ventricle: Left ventricular ejection fraction, by estimation, is 60 to 65%. The left ventricle has normal function. The left ventricle has no regional wall motion abnormalities. The  left ventricular internal cavity size was normal in size. There is  mild left ventricular hypertrophy. Left ventricular diastolic parameters are consistent with Grade I diastolic dysfunction (impaired relaxation). Right Ventricle: The right ventricular size is normal. Right ventricular systolic function is normal. Tricuspid regurgitation signal is inadequate for assessing PA pressure. Left Atrium: Left atrial size was mild to moderately dilated. Right Atrium: Right atrial size was normal in size. Pericardium: There is no evidence of pericardial effusion. Mitral Valve: Moderate mitral annular calcification. No evidence of mitral valve regurgitation. Tricuspid Valve: Tricuspid valve regurgitation is not demonstrated. Aortic Valve: The aortic valve is tricuspid. Aortic valve regurgitation is not visualized. Aortic valve sclerosis is present, with no evidence of aortic valve stenosis. Aortic valve mean gradient measures 9.0 mmHg. Aortic valve peak gradient measures 16.0 mmHg. Aortic valve area, by VTI measures 1.36 cm. Pulmonic Valve: Pulmonic valve regurgitation is trivial. Aorta: The aortic root and ascending aorta are structurally normal, with no evidence of dilitation. Venous: The inferior vena cava is normal in size with greater than 50% respiratory variability, suggesting right atrial pressure of 3 mmHg. IAS/Shunts: No atrial level shunt detected by color flow Doppler.  LEFT VENTRICLE PLAX 2D LVIDd:         3.70 cm     Diastology LVIDs:         1.90 cm     LV e' medial:    7.62 cm/s LV PW:         0.80 cm     LV E/e' medial:  12.4 LV IVS:        1.10 cm     LV e' lateral:  8.16 cm/s LVOT diam:     1.90 cm     LV E/e' lateral: 11.6 LV SV:         69 LV SV Index:   42 LVOT Area:     2.84 cm                             3D Volume EF: LV Volumes (MOD)           3D EF:        66 % LV vol d, MOD A2C: 57.0 ml LV EDV:       119 ml LV vol d, MOD A4C: 68.8 ml LV ESV:       40 ml LV vol s, MOD A2C: 20.7 ml LV SV:        79  ml LV vol s, MOD A4C: 19.4 ml LV SV MOD A2C:     36.3 ml LV SV MOD A4C:     68.8 ml LV SV MOD BP:      42.6 ml RIGHT VENTRICLE             IVC RV Basal diam:  3.30 cm     IVC diam: 1.90 cm RV S prime:     11.90 cm/s TAPSE (M-mode): 2.9 cm LEFT ATRIUM             Index        RIGHT ATRIUM           Index LA diam:        3.60 cm 2.19 cm/m   RA Area:     14.20 cm LA Vol (A2C):   45.0 ml 27.39 ml/m  RA Volume:   31.10 ml  18.93 ml/m LA Vol (A4C):   62.6 ml 38.11 ml/m LA Biplane Vol: 57.9 ml 35.24 ml/m  AORTIC VALVE AV Area (Vmax):    1.33 cm AV Area (Vmean):   1.45 cm AV Area (VTI):     1.36 cm AV Vmax:           200.00 cm/s AV Vmean:          136.000 cm/s AV VTI:            0.507 m AV Peak Grad:      16.0 mmHg AV Mean Grad:      9.0 mmHg LVOT Vmax:         93.80 cm/s LVOT Vmean:        69.400 cm/s LVOT VTI:          0.243 m LVOT/AV VTI ratio: 0.48  AORTA Ao Root diam: 2.50 cm Ao Asc diam:  3.20 cm MITRAL VALVE MV Area (PHT): 3.97 cm     SHUNTS MV Decel Time: 191 msec     Systemic VTI:  0.24 m MV E velocity: 94.70 cm/s   Systemic Diam: 1.90 cm MV A velocity: 106.00 cm/s MV E/A ratio:  0.89 Photographer signed by Ronal Ross Signature Date/Time: 10/03/2023/12:24:59 PM    Final    CT HEAD WO CONTRAST ( ) Result Date: 10/02/2023 CLINICAL DATA:  Bradycardia, hypotension, near syncope EXAM: CT HEAD WITHOUT CONTRAST TECHNIQUE: Contiguous axial images were obtained from the base of the skull through the vertex without intravenous contrast. RADIATION DOSE REDUCTION: This exam was performed according to the departmental dose-optimization program which includes automated exposure control, adjustment of the mA and/or kV according to patient size and/or use of  iterative reconstruction technique. COMPARISON:  05/08/2022 FINDINGS: Brain: No evidence of acute infarction, hemorrhage, hydrocephalus, extra-axial collection or mass lesion/mass effect. Subcortical white matter and periventricular small vessel  ischemic changes. Vascular: Intracranial atherosclerosis. Skull: Normal. Negative for fracture or focal lesion. Sinuses/Orbits: The visualized paranasal sinuses are essentially clear. The mastoid air cells are unopacified. Other: None. IMPRESSION: No acute intracranial abnormality.  Small vessel ischemic changes. Electronically Signed   By: Pinkie Pebbles M.D.   On: 10/02/2023 23:54   DG Chest Portable 1 View Result Date: 10/02/2023 CLINICAL DATA:  Hypotension. EXAM: PORTABLE CHEST 1 VIEW COMPARISON:  Chest radiograph dated July 10, 2023. FINDINGS: The heart size and mediastinal contours are within normal limits. Loop recorder device projecting over the left chest wall. Both lungs are clear. No sizable pleural effusion or pneumothorax. No acute osseous abnormality. IMPRESSION: No acute cardiopulmonary findings. Electronically Signed   By: Harrietta Sherry M.D.   On: 10/02/2023 21:01   US  RENAL ARTERY DUPLEX COMPLETE Result Date: 10/02/2023 CLINICAL DATA:  Stage III A chronic kidney disease Hypertension EXAM: RENAL/URINARY TRACT ULTRASOUND RENAL DUPLEX DOPPLER ULTRASOUND COMPARISON:  None available FINDINGS: Right Kidney: Length: 9.1 cm. Echogenicity within normal limits. No mass or hydronephrosis visualized. Left Kidney: Length: 10.6 cm. Echogenicity within normal limits. No mass or hydronephrosis visualized. Bladder: Evaluation limited by underdistention. Grossly unremarkable. RENAL DUPLEX ULTRASOUND Right Renal Artery Velocities: Origin:  97 cm/sec Mid:  86 cm/sec Hilum:  92 cm/sec Interlobar:  85 cm/sec Arcuate:  40 cm/sec Left Renal Artery Velocities: Origin:  81 cm/sec Mid:  90 cm/sec Hilum:  78 cm/sec Interlobar:  62 cm/sec Arcuate:  52 cm/sec Aortic Velocity:  100 cm/sec Right Renal-Aortic Ratios: Origin: 1.0 Mid:  0.9 Hilum: 0.9 Interlobar: 0.9 Arcuate: 0.4 Left Renal-Aortic Ratios: Origin: 0.8 Mid: 0.9 Hilum: 0.8 Interlobar: 0.6 Arcuate: 0.5 IMPRESSION: 1. No significant grayscale  abnormality of the kidneys. 2. No Doppler evidence of significant renal artery stenosis. Electronically Signed   By: Aliene Lloyd M.D.   On: 10/02/2023 10:02    Assessment & Plan:   Primary hypertension- Her BP is adequately well controlled. 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 -     IBC + Ferritin; Future -     Vitamin B1; Future -     Vitamin B12; Future -     CBC with Differential/Platelet; Future -     Reticulocytes; Future -     Folate; Future  Hyperlipidemia LDL goal <100- LDL goal achieved. Doing well on the statin  -     Rosuvastatin  Calcium ; Take 1 tablet (20 mg total) by mouth daily.  Dispense: 90 tablet; Refill: 0     Follow-up: Return in about 6 months (around 04/27/2024).  Debby Molt, MD

## 2023-10-30 LAB — IBC + FERRITIN
Ferritin: 35.9 ng/mL (ref 10.0–291.0)
Iron: 119 ug/dL (ref 42–145)
Saturation Ratios: 30.1 % (ref 20.0–50.0)
TIBC: 394.8 ug/dL (ref 250.0–450.0)
Transferrin: 282 mg/dL (ref 212.0–360.0)

## 2023-10-30 LAB — BASIC METABOLIC PANEL
BUN: 25 mg/dL — ABNORMAL HIGH (ref 6–23)
CO2: 28 meq/L (ref 19–32)
Calcium: 9.8 mg/dL (ref 8.4–10.5)
Chloride: 105 meq/L (ref 96–112)
Creatinine, Ser: 1.33 mg/dL — ABNORMAL HIGH (ref 0.40–1.20)
GFR: 41.54 mL/min — ABNORMAL LOW (ref >60.00)
Glucose, Bld: 78 mg/dL (ref 70–99)
Potassium: 5.1 meq/L (ref 3.5–5.1)
Sodium: 141 meq/L (ref 135–145)

## 2023-11-01 LAB — RETICULOCYTES
ABS Retic: 33750 {cells}/uL (ref 20000–80000)
Retic Ct Pct: 0.9 %

## 2023-11-01 LAB — VITAMIN B1: Vitamin B1 (Thiamine): 15 nmol/L (ref 8–30)

## 2023-11-02 ENCOUNTER — Encounter: Payer: Self-pay | Admitting: Internal Medicine

## 2023-11-03 ENCOUNTER — Other Ambulatory Visit: Payer: Self-pay | Admitting: Internal Medicine

## 2023-11-03 DIAGNOSIS — I10 Essential (primary) hypertension: Secondary | ICD-10-CM

## 2023-11-03 DIAGNOSIS — E785 Hyperlipidemia, unspecified: Secondary | ICD-10-CM

## 2023-11-03 MED ORDER — ROSUVASTATIN CALCIUM 20 MG PO TABS: 20.0000 mg | ORAL_TABLET | Freq: Every day | ORAL | 0 refills | Status: DC

## 2023-11-06 ENCOUNTER — Ambulatory Visit: Payer: Medicare Other | Admitting: Physician Assistant

## 2023-11-06 DIAGNOSIS — G8929 Other chronic pain: Secondary | ICD-10-CM

## 2023-11-06 DIAGNOSIS — M791 Myalgia, unspecified site: Secondary | ICD-10-CM

## 2023-11-06 DIAGNOSIS — I63311 Cerebral infarction due to thrombosis of right middle cerebral artery: Secondary | ICD-10-CM

## 2023-11-06 DIAGNOSIS — M79672 Pain in left foot: Secondary | ICD-10-CM

## 2023-11-06 DIAGNOSIS — M7061 Trochanteric bursitis, right hip: Secondary | ICD-10-CM

## 2023-11-06 DIAGNOSIS — E785 Hyperlipidemia, unspecified: Secondary | ICD-10-CM

## 2023-11-06 DIAGNOSIS — R760 Raised antibody titer: Secondary | ICD-10-CM

## 2023-11-06 DIAGNOSIS — K279 Peptic ulcer, site unspecified, unspecified as acute or chronic, without hemorrhage or perforation: Secondary | ICD-10-CM

## 2023-11-06 DIAGNOSIS — N1831 Chronic kidney disease, stage 3a: Secondary | ICD-10-CM

## 2023-11-06 DIAGNOSIS — I340 Nonrheumatic mitral (valve) insufficiency: Secondary | ICD-10-CM

## 2023-11-06 DIAGNOSIS — Z8719 Personal history of other diseases of the digestive system: Secondary | ICD-10-CM

## 2023-11-06 DIAGNOSIS — M79641 Pain in right hand: Secondary | ICD-10-CM

## 2023-11-06 DIAGNOSIS — F3341 Major depressive disorder, recurrent, in partial remission: Secondary | ICD-10-CM

## 2023-11-06 DIAGNOSIS — I10 Essential (primary) hypertension: Secondary | ICD-10-CM

## 2023-11-06 DIAGNOSIS — K3184 Gastroparesis: Secondary | ICD-10-CM

## 2023-11-06 DIAGNOSIS — G500 Trigeminal neuralgia: Secondary | ICD-10-CM

## 2023-11-06 DIAGNOSIS — H35389 Toxic maculopathy, unspecified eye: Secondary | ICD-10-CM

## 2023-11-06 DIAGNOSIS — M51369 Other intervertebral disc degeneration, lumbar region without mention of lumbar back pain or lower extremity pain: Secondary | ICD-10-CM

## 2023-11-06 DIAGNOSIS — F411 Generalized anxiety disorder: Secondary | ICD-10-CM

## 2023-11-06 DIAGNOSIS — M3219 Other organ or system involvement in systemic lupus erythematosus: Secondary | ICD-10-CM

## 2023-11-06 DIAGNOSIS — Z8739 Personal history of other diseases of the musculoskeletal system and connective tissue: Secondary | ICD-10-CM

## 2023-11-06 DIAGNOSIS — Z79899 Other long term (current) drug therapy: Secondary | ICD-10-CM

## 2023-11-06 NOTE — Progress Notes (Signed)
 Office Visit Note  Patient: Debbie Hatfield             Date of Birth: [birth year]           MRN: [id number]             PCP: Joshua Debby CROME, MD Referring: Joshua Debby CROME, MD Visit Date: 11/20/2023 Occupation: @GUAROCC @  Subjective:  Routine follow up   History of Present Illness: Debbie Hatfield is a 68 y.o. female with history of systemic lupus and osteoarthritis.  She is not currently taking any immunosuppressive agents.  She denies any signs or times a day systemic lupus flare.  Patient reports that she continues to experience fatigue and her blood pressure has been labile recently.  She was recently hospitalized on 10/02/2023 due to hypotension and bradycardia.  Patient was recent evaluated by Dr. Monetta and will be evaluated by Dr. Raford soon. Patient attributes most of her symptoms to her blood pressure being uncontrolled.  She denies any increased joint pain or joint swelling.  She takes tramadol  as needed for symptomatic relief.  She has been avoiding NSAID use.  Patient continues to have chronic sicca symptoms which have been unchanged.  She has intermittent symptoms of Raynaud's phenomenon especially with the colder weather temperatures.  She denies any oral or nasal ulcerations.  She denies any recent rashes.  Activities of Daily Living:  Patient reports morning stiffness for 2 hours.   Patient Reports nocturnal pain.  Difficulty dressing/grooming: Reports Difficulty climbing stairs: Denies Difficulty getting out of chair: Denies Difficulty using hands for taps, buttons, cutlery, and/or writing: Denies  Review of Systems  Constitutional:  Positive for fatigue.  HENT:  Positive for mouth sores. Negative for mouth dryness and nose dryness.   Eyes:  Negative for pain and dryness.  Respiratory:  Negative for shortness of breath and difficulty breathing.   Cardiovascular:  Positive for palpitations. Negative for chest pain.  Gastrointestinal:  Positive for constipation and  diarrhea. Negative for blood in stool.  Endocrine: Negative for increased urination.  Genitourinary:  Negative for involuntary urination.  Musculoskeletal:  Positive for joint pain, gait problem, joint pain, myalgias, muscle weakness, morning stiffness, muscle tenderness and myalgias. Negative for joint swelling.  Skin:  Positive for color change. Negative for rash, hair loss and sensitivity to sunlight.  Allergic/Immunologic: Negative for susceptible to infections.  Neurological:  Negative for dizziness and headaches.  Hematological:  Positive for swollen glands.  Psychiatric/Behavioral:  Positive for depressed mood and sleep disturbance. The patient is nervous/anxious.     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   Hyperlipidemia LDL goal <100 03/26/2022   Recurrent major depressive disorder, in partial remission (HCC) 11/15/2021   GAD (generalized anxiety disorder) 11/15/2021   Stage 3a chronic kidney disease (HCC) 11/15/2021   Trigeminal neuralgia    Lupus    Irritable bowel disease    GERD (gastroesophageal reflux disease)    Hypertension    Gastroparesis    Degeneration of lumbar intervertebral disc 01/26/2020   Osteopenia 12/14/2015   Peptic ulcer disease 12/14/2015    Past Medical History:  Diagnosis Date   AKI (acute kidney injury) (HCC) 10/03/2023   Closed fracture of right distal radius    Degeneration of lumbar intervertebral disc 01/26/2020   Depression with anxiety 10/03/2023   GAD (generalized  anxiety disorder) 11/15/2021   Gastroparesis    GERD (gastroesophageal reflux disease)    History of CVA (cerebrovascular accident) 10/03/2023   Hyperlipidemia LDL goal <100 03/26/2022   The ASCVD Risk score (Arnett DK, et al., 2019) failed to calculate for the following reasons:    The valid HDL cholesterol  range is 20 to 100 mg/dL     Hypertension    Hypotension 10/03/2023   Irritable bowel disease    Lupus    Major depression    Morton's neuroma of left foot 08/30/2023   Near syncope 10/03/2023   Need for shingles vaccine 11/15/2021   Need for vaccination 11/15/2021   Nonrheumatic mitral valve regurgitation 05/10/2022   Osteopenia 12/14/2015   Peptic ulcer disease 12/14/2015   Recurrent major depressive disorder, in partial remission (HCC) 11/15/2021   Sinus bradycardia 10/03/2023   Stage 3a chronic kidney disease (HCC) 11/15/2021   Estimated Creatinine Clearance: 49.7 mL/min (by C-G formula based on SCr of 1.01 mg/dL).      Estimated Creatinine Clearance: 45.9 mL/min (A) (by C-G formula based on SCr of 1.07 mg/dL (H)).      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   Stroke [facility])    Trigeminal neuralgia    Visit for screening mammogram 11/15/2021    Family History  Problem Relation Age of Onset   Heart block Mother        Pacemaker   Hypertension Mother    Depression Mother    Gout Mother    Macular degeneration Mother    Aneurysm Mother    Alcoholism Father    Congestive Heart Failure Maternal Grandmother    Diabetes Maternal Grandmother    Congestive Heart Failure Maternal Grandfather    Colon cancer Maternal Grandfather    Stroke Paternal Grandfather    Hypertension Paternal Grandfather    Esophageal cancer Neg Hx    Stomach cancer Neg Hx    Rectal cancer Neg Hx    Past Surgical History:  Procedure Laterality Date   BUBBLE STUDY  07/05/2022   Procedure: BUBBLE STUDY;  Surgeon: Loni Soyla LABOR, MD;  Location: [facility] ENDOSCOPY;  Service: Cardiology;;   CARPAL TUNNEL RELEASE Left 05/15/2023   CHOLECYSTECTOMY     COLONOSCOPY     LOOP RECORDER IMPLANT  2021   ORIF WRIST FRACTURE Right 09/22/2020   Procedure: OPEN REDUCTION INTERNAL FIXATION (ORIF) WRIST FRACTURE;  Surgeon: Carolee Lynwood JINNY DOUGLAS, MD;  Location: [facility];  Service: Orthopedics;  Laterality:  Right;    TEE WITHOUT CARDIOVERSION N/A 07/05/2022   Procedure: TRANSESOPHAGEAL ECHOCARDIOGRAM (TEE);  Surgeon: Loni Soyla LABOR, MD;  Location: [facility] ENDOSCOPY;  Service: Cardiology;  Laterality: N/A;   Social History   Social History Narrative   06/13/22 living with her mother   2 [employer] per [employer] History  Administered Date(s) Administered   Influenza,inj,Quad PF,6-35 Mos 06/17/2019   Influenza-Unspecified 09/02/2020, 07/27/2021, 07/15/2022   Moderna SARS-COV2 Booster Vaccination 09/02/2020   Moderna Sars-Covid-2 Vaccination 10/23/2019, 11/20/2019   PNEUMOCOCCAL CONJUGATE-20 11/15/2021   Tdap 03/28/2014, 12/29/2020     Objective: Vital Signs: BP (!) 147/74 (BP Location: Left Arm, Patient Position: Sitting, Cuff Size: Normal)   Pulse (!) 58   Resp 14   Ht 5' 2 (1.575 m)   Wt 145 lb 12.8 oz (66.1 kg)   BMI 26.67 kg/m    Physical Exam Vitals and nursing note reviewed.  Constitutional:      Appearance: She is well-developed.  HENT:  Head: Normocephalic and atraumatic.  Eyes:     Conjunctiva/sclera: Conjunctivae normal.  Cardiovascular:     Rate and Rhythm: Normal rate and regular rhythm.     Heart sounds: Normal heart sounds.  Pulmonary:     Effort: Pulmonary effort is normal.     Breath sounds: Normal breath sounds.  Abdominal:     General: Bowel sounds are normal.     Palpations: Abdomen is soft.  Musculoskeletal:     Cervical back: Normal range of motion.  Lymphadenopathy:     Cervical: No cervical adenopathy.  Skin:    General: Skin is warm and dry.     Capillary Refill: Capillary refill takes less than 2 seconds.  Neurological:     Mental Status: She is alert and oriented to person, place, and time.  Psychiatric:        Behavior: Behavior normal.      Musculoskeletal Exam: C-spine, thoracic spine, lumbar spine and good range of motion.  Shoulder joints, elbow joints, wrist joints, MCPs, PIPs, DIPs have good range of motion  with no synovitis.  Complete fist formation bilaterally.  Hip joints have good ROM with no groin pain.  Knee joints have good ROM with no warmth or effusion.  Ankle joints have good ROM with no tenderness or swelling.    CDAI Exam: CDAI Score: -- Patient Global: --; Provider Global: -- Swollen: --; Tender: -- Joint Exam 11/20/2023   No joint exam has been documented for this visit   There is currently no information documented on the homunculus. Go to the Rheumatology activity and complete the homunculus joint exam.  Investigation: No additional findings.  Imaging: CUP PACEART REMOTE DEVICE CHECK Result Date: 10/25/2023 ILR summary report received. Battery status OK. Normal device function. No new tachy, brady, or pause episodes. No new AF episodes. Monthly summary reports and ROV/PRN 4 symptom activations', heart pounding, fluttering,  light headed, SR with PAC's and PVC's LA, CVRS   Recent Labs: Lab Results  Component Value Date   WBC 5.2 10/29/2023   HGB 11.3 (L) 10/29/2023   PLT 295.0 10/29/2023   NA 141 10/29/2023   K 5.1 10/29/2023   CL 105 10/29/2023   CO2 28 10/29/2023   GLUCOSE 78 10/29/2023   BUN 25 (H) 10/29/2023   CREATININE 1.33 (H) 10/29/2023   BILITOT 0.5 10/02/2023   ALKPHOS 69 10/02/2023   AST 26 10/02/2023   ALT 16 10/02/2023   PROT 6.2 (L) 10/02/2023   ALBUMIN 3.6 10/02/2023   CALCIUM  9.8 10/29/2023   GFRAA >90 07/07/2012   QFTBGOLDPLUS NEGATIVE 06/22/2022    Speciality Comments: Ocular toxicity from Plaquenil  use MTX 6 tablets p.o. weekly caused muscle pain Imuran -myalgias  Procedures:  No procedures performed Allergies: Plaquenil  [hydroxychloroquine ], Sulfa antibiotics, Sulfamethoxazole-trimethoprim, and Penicillins   Assessment / Plan:     Visit Diagnoses: Other organ or system involvement in systemic lupus erythematosus (HCC) - dxd at age 62.H/o +ANA, fatigue and myalgias.  PLQ discontinued in 2023 due to ocular toxicity: She has not had  any signs or symptoms of a systemic lupus flare.  She is not currently taking immunosuppressive agents.  Patient has been experiencing ongoing fatigue which she attributes to her blood pressure being labile.  She will be establishing care with Dr. Raford since medication changes made have been ineffective.  She is not experiencing any other new or worsening symptoms.  She has no synovitis on examination today.  No oral or nasal ulcerations.  She continues to have  chronic sicca symptoms which have been unchanged.  She has intermittent symptoms of Raynaud's phenomenon but capillary refills less than 2 seconds today.  No signs of sclerodactyly noted.  Discussed the importance of keeping her core body temperature warm, drinking warm fluids, and wearing gloves and socks throughout the winter months especially.   Lab work from 09/23/23 was reviewed today in the office: anticardiolipin antibodies negative, beta-2  glycoprotein antibodies negative, complements WNL, dsDNA negative, ESR WNL, and ANA negative.  She will continue to require close lab monitoring.  She was advised to notify us  if she develops any signs or symptoms of a flare.  She will follow-up in the office in 5 months or sooner if needed.  High risk medication use - Not currently taking any immunosuppressive agents. Discontinued imuran  after 1 dose on 08/04/22 due to tremors and myalgias.  Methotrexate-myalgias. She does not require immunosuppression at this time.  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 Evaluated by Dr. Federico on 11/01/2022.   Anticardiolipin antibodies and beta-2  glycoprotein antibodies negative on 09/23/2023. Patient mains on Plavix  as prescribed.  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  Primary osteoarthritis of both hands - X-rays of both hands were consistent with osteoarthritis.  No erosive  changes were noted.  No synovitis noted on examination today.   Pain in both feet - Early OA-She is not experiencing any increased discomfort in her feet at this time.  No synovitis of ankle joints noted.   Cerebrovascular accident (CVA) due to thrombosis of right middle cerebral artery [facility]): Patient remains on plavix  as prescribed.    Degeneration of intervertebral disc of lumbar region without discogenic back pain or lower extremity pain - Under the care of Dr. Bonner  Trochanteric bursitis, right hip: Intermittent discomfort.  No groin pain currently.   Chronic pain of both knees: No warmth or effusion noted.   Other medical conditions are listed as follows:  Primary hypertension: Blood pressure was 147/74 today in the office.  She will be establishing care with Dr. Raford for management of hypertension.  Her blood pressure has been labile recently.  She presented to the ED on 10/02/2023 and has also been evaluated by Dr. Monetta recently.  Nonrheumatic mitral valve regurgitation  Stage 3a chronic kidney disease (HCC)  Hyperlipidemia LDL goal <100  Gastroparesis  History of gastroesophageal reflux (GERD)  History of IBS  Peptic ulcer disease  Trigeminal neuralgia  History of osteopenia  Anemia, chronic disease  Recurrent major depressive disorder, in partial remission (HCC)  GAD (generalized anxiety disorder)  Orders: No orders of the defined types were placed in this encounter.  No orders of the defined types were placed in this encounter.    Follow-Up Instructions: Return in about 5 months (around 04/18/2024) for Systemic lupus erythematosus, Osteoarthritis.   Waddell CHRISTELLA Craze, PA-C  Note - This record has been created using Dragon software.  Chart creation errors have been sought, but may not always  have been located. Such creation errors do not reflect on  the standard of medical care.

## 2023-11-10 ENCOUNTER — Encounter: Payer: Self-pay | Admitting: Internal Medicine

## 2023-11-11 ENCOUNTER — Other Ambulatory Visit: Payer: Self-pay

## 2023-11-11 DIAGNOSIS — E785 Hyperlipidemia, unspecified: Secondary | ICD-10-CM

## 2023-11-11 MED ORDER — ROSUVASTATIN CALCIUM 20 MG PO TABS: 20.0000 mg | ORAL_TABLET | Freq: Every day | ORAL | 1 refills | Status: DC

## 2023-11-19 ENCOUNTER — Ambulatory Visit (HOSPITAL_COMMUNITY): Payer: Medicare Other | Admitting: Family

## 2023-11-20 ENCOUNTER — Ambulatory Visit: Payer: Medicare Other | Attending: Physician Assistant | Admitting: Physician Assistant

## 2023-11-20 ENCOUNTER — Encounter: Payer: Self-pay | Admitting: Physician Assistant

## 2023-11-20 VITALS — BP 147/74 | HR 58 | Resp 14 | Ht 62.0 in | Wt 145.8 lb

## 2023-11-20 DIAGNOSIS — F3341 Major depressive disorder, recurrent, in partial remission: Secondary | ICD-10-CM | POA: Insufficient documentation

## 2023-11-20 DIAGNOSIS — M79671 Pain in right foot: Secondary | ICD-10-CM | POA: Diagnosis present

## 2023-11-20 DIAGNOSIS — M25562 Pain in left knee: Secondary | ICD-10-CM | POA: Insufficient documentation

## 2023-11-20 DIAGNOSIS — I63311 Cerebral infarction due to thrombosis of right middle cerebral artery: Secondary | ICD-10-CM | POA: Diagnosis present

## 2023-11-20 DIAGNOSIS — I10 Essential (primary) hypertension: Secondary | ICD-10-CM | POA: Diagnosis present

## 2023-11-20 DIAGNOSIS — F411 Generalized anxiety disorder: Secondary | ICD-10-CM | POA: Diagnosis present

## 2023-11-20 DIAGNOSIS — M51369 Other intervertebral disc degeneration, lumbar region without mention of lumbar back pain or lower extremity pain: Secondary | ICD-10-CM | POA: Insufficient documentation

## 2023-11-20 DIAGNOSIS — M79672 Pain in left foot: Secondary | ICD-10-CM | POA: Diagnosis present

## 2023-11-20 DIAGNOSIS — M7061 Trochanteric bursitis, right hip: Secondary | ICD-10-CM | POA: Insufficient documentation

## 2023-11-20 DIAGNOSIS — G8929 Other chronic pain: Secondary | ICD-10-CM | POA: Diagnosis present

## 2023-11-20 DIAGNOSIS — Z79899 Other long term (current) drug therapy: Secondary | ICD-10-CM | POA: Diagnosis present

## 2023-11-20 DIAGNOSIS — H35389 Toxic maculopathy, unspecified eye: Secondary | ICD-10-CM | POA: Insufficient documentation

## 2023-11-20 DIAGNOSIS — Z8719 Personal history of other diseases of the digestive system: Secondary | ICD-10-CM | POA: Insufficient documentation

## 2023-11-20 DIAGNOSIS — K3184 Gastroparesis: Secondary | ICD-10-CM | POA: Diagnosis present

## 2023-11-20 DIAGNOSIS — I340 Nonrheumatic mitral (valve) insufficiency: Secondary | ICD-10-CM | POA: Diagnosis present

## 2023-11-20 DIAGNOSIS — N1831 Chronic kidney disease, stage 3a: Secondary | ICD-10-CM | POA: Insufficient documentation

## 2023-11-20 DIAGNOSIS — D638 Anemia in other chronic diseases classified elsewhere: Secondary | ICD-10-CM | POA: Diagnosis present

## 2023-11-20 DIAGNOSIS — E785 Hyperlipidemia, unspecified: Secondary | ICD-10-CM | POA: Diagnosis present

## 2023-11-20 DIAGNOSIS — T372X5A Adverse effect of antimalarials and drugs acting on other blood protozoa, initial encounter: Secondary | ICD-10-CM | POA: Insufficient documentation

## 2023-11-20 DIAGNOSIS — G500 Trigeminal neuralgia: Secondary | ICD-10-CM | POA: Diagnosis present

## 2023-11-20 DIAGNOSIS — K279 Peptic ulcer, site unspecified, unspecified as acute or chronic, without hemorrhage or perforation: Secondary | ICD-10-CM | POA: Insufficient documentation

## 2023-11-20 DIAGNOSIS — Z8739 Personal history of other diseases of the musculoskeletal system and connective tissue: Secondary | ICD-10-CM | POA: Insufficient documentation

## 2023-11-20 DIAGNOSIS — M3219 Other organ or system involvement in systemic lupus erythematosus: Secondary | ICD-10-CM | POA: Insufficient documentation

## 2023-11-20 DIAGNOSIS — M19042 Primary osteoarthritis, left hand: Secondary | ICD-10-CM | POA: Insufficient documentation

## 2023-11-20 DIAGNOSIS — M19041 Primary osteoarthritis, right hand: Secondary | ICD-10-CM | POA: Diagnosis present

## 2023-11-20 DIAGNOSIS — M25561 Pain in right knee: Secondary | ICD-10-CM | POA: Diagnosis present

## 2023-11-20 DIAGNOSIS — R760 Raised antibody titer: Secondary | ICD-10-CM | POA: Diagnosis present

## 2023-11-22 ENCOUNTER — Other Ambulatory Visit: Payer: Self-pay | Admitting: Neurology

## 2023-11-25 ENCOUNTER — Telehealth: Payer: Self-pay

## 2023-11-25 ENCOUNTER — Other Ambulatory Visit: Payer: Self-pay

## 2023-11-25 ENCOUNTER — Other Ambulatory Visit: Payer: Self-pay | Admitting: Nephrology

## 2023-11-25 ENCOUNTER — Encounter: Payer: Self-pay | Admitting: Neurology

## 2023-11-25 DIAGNOSIS — R0989 Other specified symptoms and signs involving the circulatory and respiratory systems: Secondary | ICD-10-CM

## 2023-11-25 MED ORDER — CLONAZEPAM 0.5 MG PO TABS: 1.0000 mg | ORAL_TABLET | Freq: Every evening | ORAL | 0 refills | Status: DC | PRN

## 2023-11-25 NOTE — Telephone Encounter (Signed)
 Last seen 08/06/24, next appt 08/13/24 Dispenses   Dispensed Days Supply Quantity Provider Pharmacy  CLONAZEPAM  0.5 MG TABLET 03/17/2023 30 30 tablet Sater, Sherida Dimmer, MD [facility]...  CLONAZEPAM  0.5 MG TABLET 01/23/2023 30 30 tablet Sater, Sherida Dimmer, MD [facility]

## 2023-11-25 NOTE — Telephone Encounter (Signed)
 Alert remote transmission:  Recurrent symptom alert  1 symptoms with SR/ST  22 symptom events for lifetime total.  Routing to consider calling pt for education/reassurance.  Follow up as scheduled.  Patient states that she is feeling poorly. HR's she reports running over 100 for about 3 weeks. BP's running 160/100 - 201/120 SOB ongoing for 3 weeks at rest and activity Reports intermittent dizziness and near syncopal events.  No chest pain Denies an edema  Forwarding to Dr. Sandee Crook and [facility] triage team. She says she was supposed to be getting in with a specialist for her pressures.  Has ER precautions.    Reviewed use of sx activator, as these are pertinent symptoms, appropriate use.

## 2023-11-25 NOTE — Telephone Encounter (Signed)
 Called patient and she reported being SOB, with elevated blood pressures of 180/120 and 190/110. Her heart rate is tachy at 110 - 120's. She passed out about 1 year ago and this past Sunday she became very dizzy and almost passed out again but she was able to lower herself to the ground. She saw Dr. Zelda Hickman and they ordered a carotid ultrasound and was referred to see Dr. Theodis Fiscal. She also states that she has a history of lupus. Please advise.

## 2023-11-26 ENCOUNTER — Ambulatory Visit (INDEPENDENT_AMBULATORY_CARE_PROVIDER_SITE_OTHER): Payer: Medicare Other | Admitting: Licensed Clinical Social Worker

## 2023-11-26 DIAGNOSIS — F4310 Post-traumatic stress disorder, unspecified: Secondary | ICD-10-CM | POA: Diagnosis not present

## 2023-11-26 DIAGNOSIS — F411 Generalized anxiety disorder: Secondary | ICD-10-CM

## 2023-11-26 DIAGNOSIS — F332 Major depressive disorder, recurrent severe without psychotic features: Secondary | ICD-10-CM

## 2023-11-26 NOTE — Telephone Encounter (Signed)
Left message for the patient to call back.
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
## 2023-11-27 ENCOUNTER — Ambulatory Visit (HOSPITAL_COMMUNITY): Payer: Medicare Other | Admitting: Family

## 2023-11-27 ENCOUNTER — Other Ambulatory Visit: Payer: Medicare Other

## 2023-11-28 ENCOUNTER — Other Ambulatory Visit: Payer: Medicare Other

## 2023-11-29 ENCOUNTER — Ambulatory Visit (INDEPENDENT_AMBULATORY_CARE_PROVIDER_SITE_OTHER): Payer: Medicare Other

## 2023-11-29 DIAGNOSIS — R55 Syncope and collapse: Secondary | ICD-10-CM | POA: Diagnosis not present

## 2023-11-29 NOTE — Progress Notes (Signed)
Carelink Summary Report / Loop Recorder

## 2023-11-30 LAB — CUP PACEART REMOTE DEVICE CHECK
Date Time Interrogation Session: 20250213230407
Implantable Pulse Generator Implant Date: 20220728

## 2023-12-02 ENCOUNTER — Ambulatory Visit
Admission: RE | Admit: 2023-12-02 | Discharge: 2023-12-02 | Disposition: A | Discharge status: Home / Self Care | Payer: Medicare Other | Source: Ambulatory Visit | Attending: Nephrology | Admitting: Nephrology

## 2023-12-02 ENCOUNTER — Other Ambulatory Visit: Payer: Self-pay

## 2023-12-02 ENCOUNTER — Encounter (HOSPITAL_COMMUNITY): Payer: Self-pay | Admitting: Family

## 2023-12-02 ENCOUNTER — Ambulatory Visit (HOSPITAL_BASED_OUTPATIENT_CLINIC_OR_DEPARTMENT_OTHER): Payer: Medicare Other | Admitting: Family

## 2023-12-02 VITALS — BP 196/66 | HR 76 | Ht 62.0 in | Wt 149.0 lb

## 2023-12-02 DIAGNOSIS — R451 Restlessness and agitation: Secondary | ICD-10-CM

## 2023-12-02 DIAGNOSIS — F332 Major depressive disorder, recurrent severe without psychotic features: Secondary | ICD-10-CM | POA: Diagnosis not present

## 2023-12-02 DIAGNOSIS — I1A0 Resistant hypertension: Secondary | ICD-10-CM

## 2023-12-02 DIAGNOSIS — R0989 Other specified symptoms and signs involving the circulatory and respiratory systems: Secondary | ICD-10-CM

## 2023-12-02 MED ORDER — DOXEPIN HCL 50 MG PO CAPS: 50.0000 mg | ORAL_CAPSULE | Freq: Every day | ORAL | 0 refills | Status: DC

## 2023-12-02 NOTE — Telephone Encounter (Signed)
Called patient and informed her of Dr. Vanetta Shawl recommendation below:  "Hypertension scheduled to see Dr. Duke Salvia you may be best for symptoms restless"  Patient verbalized understanding and she had no further questions at this time. A referral was placed via Epic for the patient to see Dr. Duke Salvia.

## 2023-12-02 NOTE — Progress Notes (Signed)
BH MD/PA/NP OP Progress Note  12/02/2023 4:33 PM Debbie Hatfield  MRN:  [id number]  Chief Complaint: Debbie Hatfield reported " I am having a hard time sleeping am dealing with increased anxiety and I am just feeling restless."  Debbie Hatfield 68 year old female presents for medication management appointment.  She reports multiple psychosocial stressors related to current health history.  States she was referred by her primary care provider to cardiology and urology due to fluctuating blood pressure.  She was recently hospitalized due to hypotension.  States she recently had a ultrasound due to hypertension.  states she was initially referred to cardiology however,  never received a phone call to schedule an appointment.  States she recently schedule an appointment herself.  Debbie Hatfield stated her appointment for cardiology is not for 1 month from today.  Discussed at previous visit- holding Remeron due to syncopal episodes and low blood pressure.  States she restarted taking Remeron roughly 1 week ago and has not been able to get adequate sleep.  States she has decreased Effexor 37.5 milligrams p.o. twice daily to once daily and continues to reports ongoing mood irritability and restlessness.  She denies suicidal or homicidal ideations.  Denies auditory visual hallucinations.  States she is currently taking Klonopin prior to bedtime and then will pace her bedroom and take another Klonopin roughly 3 AM without any symptom relief.  Discussed discontinuing Remeron, patient was initiated on doxepin 50 mg p.o. nightly-will follow-up 5 days for medication tolerability/adherence. Continue Effexor 37.5 mg   HPI: Debbie Hatfield 68 year old female has a history related to major depressive disorder and generalized anxiety disorder.  Currently prescribed venlafaxine 37.5 mg p.o. twice daily.  Patient continues to adjust medications due to reports of increased anxiety, restlessness and depression.   Visit Diagnosis:    ICD-10-CM    1. Severe episode of recurrent major depressive disorder, without psychotic features (HCC)  F33.2     2. Restless  R45.1       Past Psychiatric History: See HPI  Past Medical History:  Past Medical History:  Diagnosis Date   AKI (acute kidney injury) (HCC) 10/03/2023   Closed fracture of right distal radius    Degeneration of lumbar intervertebral disc 01/26/2020   Depression with anxiety 10/03/2023   GAD (generalized anxiety disorder) 11/15/2021   Gastroparesis    GERD (gastroesophageal reflux disease)    History of CVA (cerebrovascular accident) 10/03/2023   Hyperlipidemia LDL goal <100 03/26/2022   The ASCVD Risk score (Arnett DK, et al., 2019) failed to calculate for the following reasons:    The valid HDL cholesterol range is 20 to 100 mg/dL     Hypertension    Hypotension 10/03/2023   Irritable bowel disease    Lupus    Major depression    Morton's neuroma of left foot 08/30/2023   Near syncope 10/03/2023   Need for shingles vaccine 11/15/2021   Need for vaccination 11/15/2021   Nonrheumatic mitral valve regurgitation 05/10/2022   Osteopenia 12/14/2015   Peptic ulcer disease 12/14/2015   Recurrent major depressive disorder, in partial remission (HCC) 11/15/2021   Sinus bradycardia 10/03/2023   Stage 3a chronic kidney disease (HCC) 11/15/2021   Estimated Creatinine Clearance: 49.7 mL/min (by C-G formula based on SCr of 1.01 mg/dL).      Estimated Creatinine Clearance: 45.9 mL/min (A) (by C-G formula based on SCr of 1.07 mg/dL (H)).      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   Stroke [facility])    Trigeminal neuralgia  Visit for screening mammogram 11/15/2021    Past Surgical History:  Procedure Laterality Date   BUBBLE STUDY  07/05/2022   Procedure: BUBBLE STUDY;  Surgeon: Parke Poisson, MD;  Location: [facility] ENDOSCOPY;  Service: Cardiology;;   CARPAL TUNNEL RELEASE Left 05/15/2023   CHOLECYSTECTOMY     COLONOSCOPY     LOOP RECORDER IMPLANT  2021   ORIF WRIST  FRACTURE Right 09/22/2020   Procedure: OPEN REDUCTION INTERNAL FIXATION (ORIF) WRIST FRACTURE;  Surgeon: Ernest Mallick, MD;  Location: [facility];  Service: Orthopedics;  Laterality: Right;    TEE WITHOUT CARDIOVERSION N/A 07/05/2022   Procedure: TRANSESOPHAGEAL ECHOCARDIOGRAM (TEE);  Surgeon: Parke Poisson, MD;  Location: [facility] ENDOSCOPY;  Service: Cardiology;  Laterality: N/A;    Family Psychiatric History:   Family History:  Family History  Problem Relation Age of Onset   Heart block Mother        Pacemaker   Hypertension Mother    Depression Mother    Gout Mother    Macular degeneration Mother    Aneurysm Mother    Alcoholism Father    Congestive Heart Failure Maternal Grandmother    Diabetes Maternal Grandmother    Congestive Heart Failure Maternal Grandfather    Colon cancer Maternal Grandfather    Stroke Paternal Grandfather    Hypertension Paternal Grandfather    Esophageal cancer Neg Hx    Stomach cancer Neg Hx    Rectal cancer Neg Hx     Social History:  Social History   Socioeconomic History   Marital status: Widowed    Spouse name: Not on file   Number of children: 0   Years of education: Not on file   Highest education level: Bachelor's degree (e.g., BA, AB, BS)  Occupational History    Comment: RN ortho/neuro at [employer]  Tobacco Use   Smoking status: Never    Passive exposure: Never   Smokeless tobacco: Never  Vaping Use   Vaping status: Never Used  Substance and Sexual Activity   Alcohol use: Yes    Comment: social   Drug use: Never   Sexual activity: Not Currently    Birth control/protection: Post-menopausal  Other Topics Concern   Not on file  Social History Narrative   06/13/22 living with her mother   2 [place] Dew per Week   Social Drivers of Health   Financial Resource Strain: Medium Risk (07/27/2023)   Overall Financial Resource Strain (CARDIA)    Difficulty of Paying Living Expenses: Somewhat hard   Food Insecurity: No Food Insecurity (10/03/2023)   Hunger Vital Sign    Worried About Running Out of Food in the Last Year: Never true    Ran Out of Food in the Last Year: Never true  Transportation Needs: No Transportation Needs (10/03/2023)   PRAPARE - Administrator, Civil Service (Medical): No    Lack of Transportation (Non-Medical): No  Physical Activity: Sufficiently Active (07/27/2023)   Exercise Vital Sign    Days of Exercise per Week: 4 days    Minutes of Exercise per Session: 40 min  Stress: Stress Concern Present (07/27/2023)   [employer] of Occupational Health - Occupational Stress Questionnaire    Feeling of Stress : Very much  Social Connections: Socially Isolated (07/27/2023)   Social Connection and Isolation Panel [NHANES]    Frequency of Communication with Friends and Family: Three times a week    Frequency of Social Gatherings with Friends and Family:  Twice a week    Attends Religious Services: Never    Active Member of Clubs or Organizations: No    Attends Banker Meetings: Not on file    Marital Status: Widowed    Allergies:  Allergies  Allergen Reactions   Plaquenil [Hydroxychloroquine]     Retinal damage   Sulfa Antibiotics Nausea And Vomiting   Sulfamethoxazole-Trimethoprim Nausea And Vomiting   Penicillins Rash    Metabolic Disorder Labs: Lab Results  Component Value Date   HGBA1C 5.7 (H) 05/08/2022   MPG 116.89 05/08/2022   No results found for: "PROLACTIN" Lab Results  Component Value Date   CHOL 164 12/25/2022   TRIG 84 12/25/2022   HDL 103 12/25/2022   CHOLHDL 1.6 12/25/2022   VLDL 24 05/08/2022   LDLCALC 46 12/25/2022   LDLCALC 105 (H) 05/08/2022   Lab Results  Component Value Date   TSH 1.886 10/02/2023   TSH 2.11 01/21/2023    Therapeutic Level Labs: No results found for: "LITHIUM" No results found for: "VALPROATE" Lab Results  Component Value Date   CBMZ <2.0 (L) 07/29/2023    Current  Medications: Current Outpatient Medications  Medication Sig Dispense Refill   ACETAMINOPHEN PO Take 1,300 mg by mouth in the morning and at bedtime.     acyclovir (ZOVIRAX) 400 MG tablet TAKE 1 TABLET(400 MG) BY MOUTH DAILY 90 tablet 1   clonazePAM (KLONOPIN) 0.5 MG tablet Take 2 tablets (1 mg total) by mouth at bedtime as needed for anxiety. 30 tablet 0   clopidogrel (PLAVIX) 75 MG tablet One po qd 90 tablet 4   doxepin (SINEQUAN) 50 MG capsule Take 1 capsule (50 mg total) by mouth at bedtime. 30 capsule 0   gabapentin (NEURONTIN) 300 MG capsule Take 600 mg by mouth 2 (two) times daily.     mirtazapine (REMERON) 15 MG tablet Take 15 mg by mouth at bedtime.     pantoprazole (PROTONIX) 40 MG tablet Take 1 tablet (40 mg total) by mouth 2 (two) times daily. 90 tablet 3   rosuvastatin (CRESTOR) 20 MG tablet Take 1 tablet (20 mg total) by mouth daily. 90 tablet 1   spironolactone (ALDACTONE) 25 MG tablet Take 1 tablet (25 mg total) by mouth daily. 90 tablet 3   telmisartan (MICARDIS) 40 MG tablet Take 1 tablet (40 mg total) by mouth daily. 30 tablet 3   tizanidine (ZANAFLEX) 2 MG capsule Take 4 mg by mouth at bedtime.     venlafaxine (EFFEXOR) 37.5 MG tablet Take 1 tablet (37.5 mg total) by mouth 2 (two) times daily. 180 tablet 1   traMADol (ULTRAM) 50 MG tablet Take 50 mg by mouth every 4 (four) hours as needed. (Patient not taking: Reported on 12/02/2023)     No current facility-administered medications for this visit.     Musculoskeletal: Strength & Muscle Tone: within normal limits Gait & Station: normal Patient leans: N/A  Psychiatric Specialty Exam: Review of Systems  Psychiatric/Behavioral:  Positive for decreased concentration and sleep disturbance.   All other systems reviewed and are negative.   Blood pressure (!) 196/66, pulse 76, height 5\' 2"  (1.575 m), weight 149 lb (67.6 kg).Body mass index is 27.25 kg/m.  General Appearance: Casual  Eye Contact:  Good  Speech:  Clear and  Coherent  Volume:  Normal  Mood:  Anxious and Depressed  Affect:  Congruent  Thought Process:  Coherent  Orientation:  Full (Time, Place, and Person)  Thought Content: Logical   Suicidal  Thoughts:  No  Homicidal Thoughts:  No  Memory:  Immediate;   Good Recent;   Good  Judgement:  Good  Insight:  Good  Psychomotor Activity:  Normal  Concentration:  Concentration: Good  Recall:  Good  Fund of Knowledge: Good  Language: Good  Akathisia:  No  Handed:  Right  AIMS (if indicated): not done  Assets:  Communication Skills Desire for Improvement  ADL's:  Intact  Cognition: WNL  Sleep:  Poor   Screenings: GAD-7    Advertising copywriter from 11/26/2023 in [facility] Visit from 11/15/2021 in [facility]  Total GAD-7 Score 17 2      PHQ2-9    Flowsheet Row Counselor from 11/26/2023 in [facility] at [facility] Visit from 10/29/2023 in [facility] [place] [facility] [place] [facility] Visit from 08/28/2023 in [facility] Clinical Support from 10/24/2022 in [facility] [place] Office Visit from 10/22/2022 in [facility] [place]  PHQ-2 Total Score 5 0 5 0 0  PHQ-9 Total Score 19 -- 12 8 8       Flowsheet Row Counselor from 11/26/2023 in [facility] ED to Hosp-Admission (Discharged) from 10/02/2023 in [facility] ED to Hosp-Admission (Discharged) from 05/07/2022 in [facility] NEURO/TRAUMA/SURGICAL ICU  C-SSRS RISK CATEGORY No Risk No Risk No Risk        Assessment and Plan: Legna Mausolf 68 year old female presents for medication management follow-up appointment.  She reports ongoing symptoms related to increased anxiety, mood fluctuation and restlessness.  Reports she is currently taking Effexor 37.5 once daily and  restarted Remeron 15 mg nightly 1 week prior.  She reports she continues to adjust medications without symptom relief.  Discussed discontinuing Remeron and patient to start doxepin 50 mg nightly will follow-up 12/05/2023 for medication management/tolerability.  Consideration for Seroquel and/or restarting trazodone as needed.  She was receptive to plan.  Support, encouragement and  reassurance was provided.  Collaboration of Care: Collaboration of Care: Medication Management AEB discontinue Remeron initiated doxepin 50 mg nightly  Patient/Guardian was advised Release of Information must be obtained prior to any record release in order to collaborate their care with an outside provider. Patient/Guardian was advised if they have not already done so to contact the registration department to sign all necessary forms in order for [place] to release information regarding their care.   Consent: Patient/Guardian gives verbal consent for treatment and assignment of benefits for services provided during this visit. Patient/Guardian expressed understanding and agreed to proceed.    Oneta Rack, NP 12/02/2023, 4:33 PM

## 2023-12-06 ENCOUNTER — Telehealth (HOSPITAL_COMMUNITY): Payer: Medicare Other | Admitting: Family

## 2023-12-06 DIAGNOSIS — R451 Restlessness and agitation: Secondary | ICD-10-CM

## 2023-12-06 DIAGNOSIS — G479 Sleep disorder, unspecified: Secondary | ICD-10-CM

## 2023-12-06 MED ORDER — DOXEPIN HCL 50 MG PO CAPS: 50.0000 mg | ORAL_CAPSULE | Freq: Every day | ORAL | 2 refills | Status: DC

## 2023-12-06 NOTE — Progress Notes (Signed)
Virtual Visit via Video Note  I connected with Debbie Hatfield on 12/06/23 at  7:00 AM EST by a video enabled telemedicine application and verified that I am speaking with the correct person using two identifiers.  Location: Patient: Office Provider: Home Office   I discussed the limitations of evaluation and management by telemedicine and the availability of in person appointments. The patient expressed understanding and agreed to proceed.  I discussed the assessment and treatment plan with the patient. The patient was provided an opportunity to ask questions and all were answered. The patient agreed with the plan and demonstrated an understanding of the instructions.   The patient was advised to call back or seek an in-person evaluation if the symptoms worsen or if the condition fails to improve as anticipated.  I provided 15 minutes of non-face-to-face time during this encounter.   Debbie Rack, NP   [facility] MD/PA/NP OP Progress Note  12/06/2023 8:37 AM Debbie Hatfield  MRN:  [id number]  Chief Complaint:  Medication management   HPI: Debbie Hatfield 68 year old female seen and evaluated for medication management.  Recently initiated on doxepin 50 mg for sleep disturbance.  She reports she has been taking and tolerating medications well.  States she is current roughly 7 to 8 hours of sleep. "  Once I wind down or relax I can sleep throughout the night."  Denying any lingering hangover side effects.  No concerns for suicidal or homicidal ideations.  Reports a good appetite.  Patient to follow-up 3 months.  Patient to discontinue Remeron 15 mg p.o. nightly.  Continue doxepin 50 mg nightly, will make medications available.  Support, encouragement reassurance was provided.    Visit Diagnosis:    ICD-10-CM   1. Restless  R45.1     2. Sleep disturbance  G47.9       Past Psychiatric History:   Past Medical History:  Past Medical History:  Diagnosis Date   AKI (acute kidney injury)  (HCC) 10/03/2023   Closed fracture of right distal radius    Degeneration of lumbar intervertebral disc 01/26/2020   Depression with anxiety 10/03/2023   GAD (generalized anxiety disorder) 11/15/2021   Gastroparesis    GERD (gastroesophageal reflux disease)    History of CVA (cerebrovascular accident) 10/03/2023   Hyperlipidemia LDL goal <100 03/26/2022   The ASCVD Risk score (Arnett DK, et al., 2019) failed to calculate for the following reasons:    The valid HDL cholesterol range is 20 to 100 mg/dL     Hypertension    Hypotension 10/03/2023   Irritable bowel disease    Lupus    Major depression    Morton's neuroma of left foot 08/30/2023   Near syncope 10/03/2023   Need for shingles vaccine 11/15/2021   Need for vaccination 11/15/2021   Nonrheumatic mitral valve regurgitation 05/10/2022   Osteopenia 12/14/2015   Peptic ulcer disease 12/14/2015   Recurrent major depressive disorder, in partial remission (HCC) 11/15/2021   Sinus bradycardia 10/03/2023   Stage 3a chronic kidney disease (HCC) 11/15/2021   Estimated Creatinine Clearance: 49.7 mL/min (by C-G formula based on SCr of 1.01 mg/dL).      Estimated Creatinine Clearance: 45.9 mL/min (A) (by C-G formula based on SCr of 1.07 mg/dL (H)).      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   Stroke [facility])    Trigeminal neuralgia    Visit for screening mammogram 11/15/2021    Past Surgical History:  Procedure Laterality Date   BUBBLE STUDY  07/05/2022   Procedure: BUBBLE STUDY;  Surgeon: Parke Poisson, MD;  Location: [facility] ENDOSCOPY;  Service: Cardiology;;   CARPAL TUNNEL RELEASE Left 05/15/2023   CHOLECYSTECTOMY     COLONOSCOPY     LOOP RECORDER IMPLANT  2021   ORIF WRIST FRACTURE Right 09/22/2020   Procedure: OPEN REDUCTION INTERNAL FIXATION (ORIF) WRIST FRACTURE;  Surgeon: Ernest Mallick, MD;  Location: [facility];  Service: Orthopedics;  Laterality: Right;    TEE WITHOUT CARDIOVERSION N/A 07/05/2022    Procedure: TRANSESOPHAGEAL ECHOCARDIOGRAM (TEE);  Surgeon: Parke Poisson, MD;  Location: [facility] ENDOSCOPY;  Service: Cardiology;  Laterality: N/A;    Family Psychiatric History:   Family History:  Family History  Problem Relation Age of Onset   Heart block Mother        Pacemaker   Hypertension Mother    Depression Mother    Gout Mother    Macular degeneration Mother    Aneurysm Mother    Alcoholism Father    Congestive Heart Failure Maternal Grandmother    Diabetes Maternal Grandmother    Congestive Heart Failure Maternal Grandfather    Colon cancer Maternal Grandfather    Stroke Paternal Grandfather    Hypertension Paternal Grandfather    Esophageal cancer Neg Hx    Stomach cancer Neg Hx    Rectal cancer Neg Hx     Social History:  Social History   Socioeconomic History   Marital status: Widowed    Spouse name: Not on file   Number of children: 0   Years of education: Not on file   Highest education level: Bachelor's degree (e.g., BA, AB, BS)  Occupational History    Comment: RN ortho/neuro at [employer]  Tobacco Use   Smoking status: Never    Passive exposure: Never   Smokeless tobacco: Never  Vaping Use   Vaping status: Never Used  Substance and Sexual Activity   Alcohol use: Yes    Comment: social   Drug use: Never   Sexual activity: Not Currently    Birth control/protection: Post-menopausal  Other Topics Concern   Not on file  Social History Narrative   06/13/22 living with her mother   2 [place] Dew per Week   Social Drivers of Health   Financial Resource Strain: Medium Risk (07/27/2023)   Overall Financial Resource Strain (CARDIA)    Difficulty of Paying Living Expenses: Somewhat hard  Food Insecurity: No Food Insecurity (10/03/2023)   Hunger Vital Sign    Worried About Running Out of Food in the Last Year: Never true    Ran Out of Food in the Last Year: Never true  Transportation Needs: No Transportation Needs (10/03/2023)   PRAPARE -  Administrator, Civil Service (Medical): No    Lack of Transportation (Non-Medical): No  Physical Activity: Sufficiently Active (07/27/2023)   Exercise Vital Sign    Days of Exercise per Week: 4 days    Minutes of Exercise per Session: 40 min  Stress: Stress Concern Present (07/27/2023)   [employer] of Occupational Health - Occupational Stress Questionnaire    Feeling of Stress : Very much  Social Connections: Socially Isolated (07/27/2023)   Social Connection and Isolation Panel [NHANES]    Frequency of Communication with Friends and Family: Three times a week    Frequency of Social Gatherings with Friends and Family: Twice a week    Attends Religious Services: Never    Database administrator or Organizations: No  Attends Banker Meetings: Not on file    Marital Status: Widowed    Allergies:  Allergies  Allergen Reactions   Plaquenil [Hydroxychloroquine]     Retinal damage   Sulfa Antibiotics Nausea And Vomiting   Sulfamethoxazole-Trimethoprim Nausea And Vomiting   Penicillins Rash    Metabolic Disorder Labs: Lab Results  Component Value Date   HGBA1C 5.7 (H) 05/08/2022   MPG 116.89 05/08/2022   No results found for: "PROLACTIN" Lab Results  Component Value Date   CHOL 164 12/25/2022   TRIG 84 12/25/2022   HDL 103 12/25/2022   CHOLHDL 1.6 12/25/2022   VLDL 24 05/08/2022   LDLCALC 46 12/25/2022   LDLCALC 105 (H) 05/08/2022   Lab Results  Component Value Date   TSH 1.886 10/02/2023   TSH 2.11 01/21/2023    Therapeutic Level Labs: No results found for: "LITHIUM" No results found for: "VALPROATE" Lab Results  Component Value Date   CBMZ <2.0 (L) 07/29/2023    Current Medications: Current Outpatient Medications  Medication Sig Dispense Refill   ACETAMINOPHEN PO Take 1,300 mg by mouth in the morning and at bedtime.     acyclovir (ZOVIRAX) 400 MG tablet TAKE 1 TABLET(400 MG) BY MOUTH DAILY 90 tablet 1   clonazePAM (KLONOPIN)  0.5 MG tablet Take 2 tablets (1 mg total) by mouth at bedtime as needed for anxiety. 30 tablet 0   clopidogrel (PLAVIX) 75 MG tablet One po qd 90 tablet 4   doxepin (SINEQUAN) 50 MG capsule Take 1 capsule (50 mg total) by mouth at bedtime. 30 capsule 0   gabapentin (NEURONTIN) 300 MG capsule Take 600 mg by mouth 2 (two) times daily.     mirtazapine (REMERON) 15 MG tablet Take 15 mg by mouth at bedtime.     pantoprazole (PROTONIX) 40 MG tablet Take 1 tablet (40 mg total) by mouth 2 (two) times daily. 90 tablet 3   rosuvastatin (CRESTOR) 20 MG tablet Take 1 tablet (20 mg total) by mouth daily. 90 tablet 1   spironolactone (ALDACTONE) 25 MG tablet Take 1 tablet (25 mg total) by mouth daily. 90 tablet 3   telmisartan (MICARDIS) 40 MG tablet Take 1 tablet (40 mg total) by mouth daily. 30 tablet 3   tizanidine (ZANAFLEX) 2 MG capsule Take 4 mg by mouth at bedtime.     traMADol (ULTRAM) 50 MG tablet Take 50 mg by mouth every 4 (four) hours as needed. (Patient not taking: Reported on 12/02/2023)     venlafaxine (EFFEXOR) 37.5 MG tablet Take 1 tablet (37.5 mg total) by mouth 2 (two) times daily. 180 tablet 1   No current facility-administered medications for this visit.     Musculoskeletal: Strength & Muscle Tone: within normal limits Gait & Station: normal Patient leans: N/A  Psychiatric Specialty Exam: Review of Systems  There were no vitals taken for this visit.There is no height or weight on file to calculate BMI.  General Appearance: Casual  Eye Contact:  Good  Speech:  Clear and Coherent  Volume:  Normal  Mood:  Euthymic  Affect:  Congruent  Thought Process:  Coherent  Orientation:  Full (Time, Place, and Person)  Thought Content: Logical   Suicidal Thoughts:  No  Homicidal Thoughts:  No  Memory:  Immediate;   Good Recent;   Good  Judgement:  Good  Insight:  Good  Psychomotor Activity:  Normal  Concentration:  Concentration: Good  Recall:  Good  Fund of Knowledge: Good   Language:  Good  Akathisia:  No  Handed:  Right  AIMS (if indicated): done  Assets:  Communication Skills Desire for Improvement Social Support  ADL's:  Intact  Cognition: WNL  Sleep:  Good discontinue Remeron initiated doxepin patient is tolerating and doing well with medication.  Reported sleeping 7 to 8 hours nightly-for the past 3 nights   Screenings: GAD-7    Advertising copywriter from 11/26/2023 in [facility] at [facility] Visit from 11/15/2021 in [facility]  Total GAD-7 Score 17 2      PHQ2-9    Flowsheet Row Counselor from 11/26/2023 in [facility] at [facility] Visit from 10/29/2023 in [facility] [place] [facility] Visit from 08/28/2023 in [facility] Clinical Support from 10/24/2022 in [facility] Visit from 10[facility]22/2022 in [facility] at [facility]  PHQ-2 Total Score 5 0 5 0 0  PHQ-9 Total Score 19 -- 12 8 8       Flowsheet Row Counselor from 11/26/2023 in [facility] at [facility] ED to Hosp-Admission (Discharged) from 10/02/2023 in [facility] to Hosp-Admission (Discharged) from 05/07/2022 in [facility] NEURO/TRAUMA/SURGICAL ICU  C-SSRS RISK CATEGORY No Risk No Risk No Risk        Assessment and Plan: Debbie Hatfield 68 year old female presents for medication management.  She was recently initiated on doxepin.  No concerns related to medication side effects.  Will continue medications for the next months.  Sleep has improved.  Mood is stabilized.  No concerns related to restlessness, dizziness or oversedation.  Patient to discontinue Remeron 15 mg nightly.  keep all outpatient follow-up appointment.   Collaboration of Care: Collaboration of Care: Medication Management AEB continue doxepin 50 mg p.o.  nightly as needed  Patient/Guardian was advised Release of Information must be obtained prior to any record release in order to collaborate their care with an outside provider. Patient/Guardian was advised if they have not already done so to contact the registration department to sign all necessary forms in order for [place] to release information regarding their care.   Consent: Patient/Guardian gives verbal consent for treatment and assignment of benefits for services provided during this visit. Patient/Guardian expressed understanding and agreed to proceed.    Debbie Rack, NP 12/06/2023, 8:37 AM

## 2023-12-16 ENCOUNTER — Ambulatory Visit (HOSPITAL_COMMUNITY): Payer: Self-pay | Admitting: Licensed Clinical Social Worker

## 2023-12-16 ENCOUNTER — Telehealth (INDEPENDENT_AMBULATORY_CARE_PROVIDER_SITE_OTHER): Payer: Self-pay | Admitting: Licensed Clinical Social Worker

## 2023-12-16 NOTE — Telephone Encounter (Signed)
 Verla had an in-person therapy session scheduled today for 1pm.  Clinician outreached Senya by phone at 1:09pm when she did not present on time.  Amparo did not answer this phone call, and a voicemail could not be left.  Clinician informed front desk staff of no-show event when Kimaria did not present by 1:15pm.    Noralee Stain, LCSW, LCAS 12/16/23

## 2023-12-17 ENCOUNTER — Ambulatory Visit (HOSPITAL_COMMUNITY): Payer: Medicare Other | Admitting: Family

## 2023-12-30 NOTE — Addendum Note (Signed)
 Addended by: Elease Etienne A on: 12/30/2023 01:34 PM   Modules accepted: Orders

## 2023-12-30 NOTE — Progress Notes (Signed)
 Carelink Summary Report / Loop Recorder

## 2024-01-03 ENCOUNTER — Ambulatory Visit: Payer: Medicare Other

## 2024-01-03 DIAGNOSIS — R55 Syncope and collapse: Secondary | ICD-10-CM | POA: Diagnosis not present

## 2024-01-03 LAB — CUP PACEART REMOTE DEVICE CHECK
Date Time Interrogation Session: 20250320230425
Implantable Pulse Generator Implant Date: 20220728

## 2024-01-04 ENCOUNTER — Other Ambulatory Visit: Payer: Self-pay | Admitting: Internal Medicine

## 2024-01-04 DIAGNOSIS — A6000 Herpesviral infection of urogenital system, unspecified: Secondary | ICD-10-CM

## 2024-01-04 DIAGNOSIS — G500 Trigeminal neuralgia: Secondary | ICD-10-CM

## 2024-01-06 ENCOUNTER — Other Ambulatory Visit: Payer: Self-pay | Admitting: Internal Medicine

## 2024-01-06 DIAGNOSIS — A6000 Herpesviral infection of urogenital system, unspecified: Secondary | ICD-10-CM

## 2024-01-06 MED ORDER — ACYCLOVIR 400 MG PO TABS
400.0000 mg | ORAL_TABLET | Freq: Every day | ORAL | 1 refills | Status: DC
Start: 2024-01-06 — End: 2024-07-21

## 2024-01-16 ENCOUNTER — Ambulatory Visit (INDEPENDENT_AMBULATORY_CARE_PROVIDER_SITE_OTHER): Admitting: Licensed Clinical Social Worker

## 2024-01-16 ENCOUNTER — Other Ambulatory Visit: Payer: Self-pay | Admitting: Internal Medicine

## 2024-01-16 ENCOUNTER — Telehealth: Payer: Self-pay | Admitting: Internal Medicine

## 2024-01-16 DIAGNOSIS — F411 Generalized anxiety disorder: Secondary | ICD-10-CM

## 2024-01-16 DIAGNOSIS — F3341 Major depressive disorder, recurrent, in partial remission: Secondary | ICD-10-CM

## 2024-01-16 DIAGNOSIS — F332 Major depressive disorder, recurrent severe without psychotic features: Secondary | ICD-10-CM | POA: Diagnosis not present

## 2024-01-16 DIAGNOSIS — F4310 Post-traumatic stress disorder, unspecified: Secondary | ICD-10-CM

## 2024-01-16 NOTE — Telephone Encounter (Signed)
 Patient dropped off Handicap placard form - form placed up front in Dr. Yetta Barre box

## 2024-01-16 NOTE — Progress Notes (Signed)
 THERAPIST PROGRESS NOTE   Session Time: 1:00pm - 2:00pm     Location: Patient: [facility]    Provider: [facility]      Participation Level: Active    Behavioral Response: Alert, casually dressed, depressed mood/affect   Type of Therapy:  Individual Therapy   Treatment Goals addressed: Depression/anxiety management; Medication compliance; Improving quality of sleep   Progress Towards Goals: Progressing   Interventions: CBT, sleep hygiene techniques    Summary: Creedence Heiss is a 68 year old widowed Caucasian female that presented today with diagnoses of Major Depressive Disorder, recurrent, severe; Generalized Anxiety Disorder; and PTSD.   Suicidal/Homicidal: None; without intent or plan.     Therapist Response:  Clinician met with Haleigh today for in person therapy appointment and assessed for safety, and sobriety.  Caraline presented for today's session on time and was alert, oriented x5, with no evidence or self-report of active SI/HI or A/V hallucinations.  Tattianna denied any use of alcohol or illicit substances.  Clinician inquired about Quanda's emotional ratings today, as well as any significant changes in thoughts, feelings, or behavior since previous check-in.  Saray reported scores of 5/10 for depression, 5/10 for anxiety, and 3/10 for anger/irritability.  Brytni denied experiencing any recent panic attacks or outbursts.  Aliviyah reported that a recent success was going to the beach with her sister, which was a nice break for her, stating "We took some long walks on the beach".  She reported that an ongoing struggle has been dealing with poor sleep, noting that she averages about 5 hours most nights.  Clinician discussed topic of sleep hygiene today with Keegan.  Clinician defined this as the habits, behaviors and environmental factors that can be adjusted to improve overall sleep quality.  Clinician also discussed how lack of consistent sleep can negatively affect mood and  overall mental health.  Clinician provided Quin with a screening to complete assessing typical barriers one might face in achieving quality sleep at night (i.e. struggling to get up in the morning, waking up throughout the night, tossing/turning, etc), and inquired about her perception of sleep hygiene at present.  Clinician offered techniques to [facility] via handout which could be implemented in order to improve sleep hygiene, such as sticking to a normal morning/night routine, avoiding using of electronics too close to bedtime, avoiding heavy meals before bed, using a sleep journal to track changes and address anxious thoughts, as well as avoiding naps during the daytime, ensuring proper use of medications if prescribed any by provider(s), and more.  Clinician inquired about changes Crista intends to make to sleep hygiene based upon information covered today.  Interventions were effective, as evidenced by Rosey Bath actively participating in discussion on subject, and taking assessment, which revealed that she has been experiencing moderate sleep deprivation for several years now.  Adoria reported that several members of her family have sleep apnea, and she has signs as well, so she feels like scheduling a sleep study through her doctor would be beneficial.  She reported that she would also make some behavioral changes based upon information presented today, including stretching before bed to alleviate pain, avoiding use of electronics like her phone at night, cutting back on 'clock watching', and making changes to her bedroom environment so that it is more peaceful and less likely to trigger trauma response.  Clinician will continue to monitor.             Plan: Follow up in 1 week.  Diagnosis: Major Depressive Disorder, recurrent, severe; Generalized Anxiety Disorder; and PTSD.  Collaboration of Care:   None required at this time.                                                      Patient/Guardian was  advised Release of Information must be obtained prior to any record release in order to collaborate their care with an outside provider. Patient/Guardian was advised if they have not already done so to contact the registration department to sign all necessary forms in order for [place] to release information regarding their care.    Consent: Patient/Guardian gives verbal consent for treatment and assignment of benefits for services provided during this visit. Patient/Guardian expressed understanding and agreed to proceed.   Noralee Stain, LCSW, LCAS 01/16/24

## 2024-01-17 ENCOUNTER — Encounter: Payer: Self-pay | Admitting: Cardiology

## 2024-01-17 ENCOUNTER — Ambulatory Visit: Payer: Medicare Other | Attending: Cardiology | Admitting: Cardiology

## 2024-01-17 VITALS — BP 160/78 | HR 62 | Ht 62.0 in | Wt 148.8 lb

## 2024-01-17 DIAGNOSIS — E785 Hyperlipidemia, unspecified: Secondary | ICD-10-CM | POA: Diagnosis present

## 2024-01-17 DIAGNOSIS — R001 Bradycardia, unspecified: Secondary | ICD-10-CM | POA: Diagnosis present

## 2024-01-17 DIAGNOSIS — I1A0 Resistant hypertension: Secondary | ICD-10-CM | POA: Insufficient documentation

## 2024-01-17 DIAGNOSIS — I2510 Atherosclerotic heart disease of native coronary artery without angina pectoris: Secondary | ICD-10-CM | POA: Insufficient documentation

## 2024-01-17 DIAGNOSIS — Z79899 Other long term (current) drug therapy: Secondary | ICD-10-CM | POA: Insufficient documentation

## 2024-01-17 DIAGNOSIS — Z8673 Personal history of transient ischemic attack (TIA), and cerebral infarction without residual deficits: Secondary | ICD-10-CM | POA: Insufficient documentation

## 2024-01-17 LAB — COMPREHENSIVE METABOLIC PANEL WITH GFR
ALT: 17 IU/L (ref 0–32)
AST: 22 IU/L (ref 0–40)
Albumin: 4.7 g/dL (ref 3.9–4.9)
Alkaline Phosphatase: 130 IU/L — ABNORMAL HIGH (ref 44–121)
BUN/Creatinine Ratio: 20 (ref 12–28)
BUN: 28 mg/dL — ABNORMAL HIGH (ref 8–27)
Bilirubin Total: 0.3 mg/dL (ref 0.0–1.2)
CO2: 22 mmol/L (ref 20–29)
Calcium: 9.9 mg/dL (ref 8.7–10.3)
Chloride: 104 mmol/L (ref 96–106)
Creatinine, Ser: 1.37 mg/dL — ABNORMAL HIGH (ref 0.57–1.00)
Globulin, Total: 2.8 g/dL (ref 1.5–4.5)
Glucose: 92 mg/dL (ref 70–99)
Potassium: 5 mmol/L (ref 3.5–5.2)
Sodium: 140 mmol/L (ref 134–144)
Total Protein: 7.5 g/dL (ref 6.0–8.5)
eGFR: 42 mL/min/{1.73_m2} — ABNORMAL LOW (ref >59)

## 2024-01-17 LAB — MAGNESIUM: Magnesium: 2.2 mg/dL (ref 1.6–2.3)

## 2024-01-17 MED ORDER — AMLODIPINE BESYLATE 5 MG PO TABS: 5.0000 mg | ORAL_TABLET | Freq: Every day | ORAL | 3 refills | Status: DC

## 2024-01-17 NOTE — Progress Notes (Signed)
 Cardiology Office Note:    Date:  01/18/2024   ID:  Debbie Hatfield, DOB [birth year], MRN [id number]  PCP:  Etta Grandchild, MD  Cardiologist:  Norman Herrlich, MD  Electrophysiologist:  None   Referring MD: Etta Grandchild, MD   'I am doing well"  History of Present Illness:    Debbie Hatfield is a 68 y.o. female with a hx of resistant hypertension, previous syncope episode status post implantable loop recorder due to stroke, history of right middle cerebral CVA s/p ILR, incidental PFO felt not to be the cause of her stroke, hyperlipidemia, Mild coronary artery disease  here today for follow-up visit.  It is my first time with the patient she had previously seen Dr. Norman Herrlich her last visit with him was in January 2025.   In the interim her nephrologist also referred her to Dr. Duke Salvia in the hypertension clinic.   Today she shared that she has been on multiple antihypertensives in the past but her blood pressure still has not been successfully controlled.  In the office she  expresses a desire for her blood pressure to be closely monitored.  She is currently on spironolactone, telmisartan, and chlorthidone. She has recently started chlorthidone, and there have been concerns about significant low heart rate, leading to avoidance of beta blockers and Non-dihydropyridine calcium channel blockers.   She has had some secondary hypertesion work up: renal artery stenosis ruled out US renal artery in 2024, previous labs for aldosterone-renin ratio normal  She offers no complaints at this time.    Past Medical History:  Diagnosis Date   AKI (acute kidney injury) (HCC) 10/03/2023   Closed fracture of right distal radius    Degeneration of lumbar intervertebral disc 01/26/2020   Depression with anxiety 10/03/2023   GAD (generalized anxiety disorder) 11/15/2021   Gastroparesis    GERD (gastroesophageal reflux disease)    History of CVA (cerebrovascular accident) 10/03/2023   Hyperlipidemia  LDL goal <100 03/26/2022   The ASCVD Risk score (Arnett DK, et al., 2019) failed to calculate for the following reasons:    The valid HDL cholesterol range is 20 to 100 mg/dL     Hypertension    Hypotension 10/03/2023   Irritable bowel disease    Lupus    Major depression    Morton's neuroma of left foot 08/30/2023   Near syncope 10/03/2023   Need for shingles vaccine 11/15/2021   Need for vaccination 11/15/2021   Nonrheumatic mitral valve regurgitation 05/10/2022   Osteopenia 12/14/2015   Peptic ulcer disease 12/14/2015   Recurrent major depressive disorder, in partial remission (HCC) 11/15/2021   Sinus bradycardia 10/03/2023   Stage 3a chronic kidney disease (HCC) 11/15/2021   Estimated Creatinine Clearance: 49.7 mL/min (by C-G formula based on SCr of 1.01 mg/dL).      Estimated Creatinine Clearance: 45.9 mL/min (A) (by C-G formula based on SCr of 1.07 mg/dL (H)).      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   Stroke [facility])    Trigeminal neuralgia    Visit for screening mammogram 11/15/2021    Past Surgical History:  Procedure Laterality Date   BUBBLE STUDY  07/05/2022   Procedure: BUBBLE STUDY;  Surgeon: Parke Poisson, MD;  Location[facility] ENDOSCOPY;  Service: Cardiology;;   CARPAL TUNNEL RELEASE Left 05/15/2023   CHOLECYSTECTOMY     COLONOSCOPY     LOOP RECORDER IMPLANT  2021   ORIF WRIST FRACTURE Right 09/22/2020   Procedure: OPEN REDUCTION INTERNAL FIXATION (  ORIF) WRIST FRACTURE;  Surgeon: Ernest Mallick, MD;  Location: [facility];  Service: Orthopedics;  Laterality: Right;    TEE WITHOUT CARDIOVERSION N/A 07/05/2022   Procedure: TRANSESOPHAGEAL ECHOCARDIOGRAM (TEE);  Surgeon: Parke Poisson, MD;  Location: [facility] ENDOSCOPY;  Service: Cardiology;  Laterality: N/A;    Current Medications: Current Meds  Medication Sig   ACETAMINOPHEN PO Take 1,300 mg by mouth in the morning and at bedtime.   acyclovir (ZOVIRAX) 400 MG tablet Take 1 tablet (400 mg  total) by mouth daily.   amLODipine (NORVASC) 5 MG tablet Take 1 tablet (5 mg total) by mouth daily.   clonazePAM (KLONOPIN) 0.5 MG tablet Take 2 tablets (1 mg total) by mouth at bedtime as needed for anxiety.   clopidogrel (PLAVIX) 75 MG tablet One po qd   doxepin (SINEQUAN) 50 MG capsule Take 1 capsule (50 mg total) by mouth at bedtime.   gabapentin (NEURONTIN) 300 MG capsule Take 600 mg by mouth 2 (two) times daily.   pantoprazole (PROTONIX) 40 MG tablet Take 1 tablet (40 mg total) by mouth 2 (two) times daily.   spironolactone (ALDACTONE) 25 MG tablet Take 1 tablet (25 mg total) by mouth daily.   telmisartan (MICARDIS) 40 MG tablet Take 1 tablet (40 mg total) by mouth daily.   tizanidine (ZANAFLEX) 2 MG capsule Take 4 mg by mouth at bedtime.   venlafaxine (EFFEXOR) 37.5 MG tablet Take 1 tablet (37.5 mg total) by mouth 2 (two) times daily.     Allergies:   Plaquenil [hydroxychloroquine], Sulfa antibiotics, Sulfamethoxazole-trimethoprim, and Penicillins   Social History   Socioeconomic History   Marital status: Widowed    Spouse name: Not on file   Number of children: 0   Years of education: Not on file   Highest education level: Bachelor's degree (e.g., BA, AB, BS)  Occupational History    Comment: RN ortho/neuro at [employer]  Tobacco Use   Smoking status: Never    Passive exposure: Never   Smokeless tobacco: Never  Vaping Use   Vaping status: Never Used  Substance and Sexual Activity   Alcohol use: Yes    Comment: social   Drug use: Never   Sexual activity: Not Currently    Birth control/protection: Post-menopausal  Other Topics Concern   Not on file  Social History Narrative   06/13/22 living with her mother   2 [place] Dew per Week   Social Drivers of Health   Financial Resource Strain: Medium Risk (07/27/2023)   Overall Financial Resource Strain (CARDIA)    Difficulty of Paying Living Expenses: Somewhat hard  Food Insecurity: No Food Insecurity (10/03/2023)   Hunger  Vital Sign    Worried About Running Out of Food in the Last Year: Never true    Ran Out of Food in the Last Year: Never true  Transportation Needs: No Transportation Needs (10/03/2023)   PRAPARE - Administrator, Civil Service (Medical): No    Lack of Transportation (Non-Medical): No  Physical Activity: Sufficiently Active (07/27/2023)   Exercise Vital Sign    Days of Exercise per Week: 4 days    Minutes of Exercise per Session: 40 min  Stress: Stress Concern Present (07/27/2023)   [employer] of Occupational Health - Occupational Stress Questionnaire    Feeling of Stress : Very much  Social Connections: Socially Isolated (07/27/2023)   Social Connection and Isolation Panel [NHANES]    Frequency of Communication with Friends and Family: Three times  a week    Frequency of Social Gatherings with Friends and Family: Twice a week    Attends Religious Services: Never    Database administrator or Organizations: No    Attends Engineer, structural: Not on file    Marital Status: Widowed     Family History: The patient's family history includes Alcoholism in her father; Aneurysm in her mother; Colon cancer in her maternal grandfather; Congestive Heart Failure in her maternal grandfather and maternal grandmother; Depression in her mother; Diabetes in her maternal grandmother; Gout in her mother; Heart block in her mother; Hypertension in her mother and paternal grandfather; Macular degeneration in her mother; Stroke in her paternal grandfather. There is no history of Esophageal cancer, Stomach cancer, or Rectal cancer.  ROS:   Review of Systems  Constitution: Negative for decreased appetite, fever and weight gain.  HENT: Negative for congestion, ear discharge, hoarse voice and sore throat.   Eyes: Negative for discharge, redness, vision loss in right eye and visual halos.  Cardiovascular: Negative for chest pain, dyspnea on exertion, leg swelling, orthopnea and  palpitations.  Respiratory: Negative for cough, hemoptysis, shortness of breath and snoring.   Endocrine: Negative for heat intolerance and polyphagia.  Hematologic/Lymphatic: Negative for bleeding problem. Does not bruise/bleed easily.  Skin: Negative for flushing, nail changes, rash and suspicious lesions.  Musculoskeletal: Negative for arthritis, joint pain, muscle cramps, myalgias, neck pain and stiffness.  Gastrointestinal: Negative for abdominal pain, bowel incontinence, diarrhea and excessive appetite.  Genitourinary: Negative for decreased libido, genital sores and incomplete emptying.  Neurological: Negative for brief paralysis, focal weakness, headaches and loss of balance.  Psychiatric/Behavioral: Negative for altered mental status, depression and suicidal ideas.  Allergic/Immunologic: Negative for HIV exposure and persistent infections.    EKGs/Labs/Other Studies Reviewed:    The following studies were reviewed today:   EKG:  The ekg ordered today demonstrates   Recent Labs: 10/02/2023: B Natriuretic Peptide 69.5; TSH 1.886 10/29/2023: Hemoglobin 11.3; Platelets 295.0 01/17/2024: ALT 17; BUN 28; Creatinine, Ser 1.37; Magnesium 2.2; Potassium 5.0; Sodium 140  Recent Lipid Panel    Component Value Date/Time   CHOL 164 12/25/2022 1047   TRIG 84 12/25/2022 1047   HDL 103 12/25/2022 1047   CHOLHDL 1.6 12/25/2022 1047   CHOLHDL 2.2 05/08/2022 0424   VLDL 24 05/08/2022 0424   LDLCALC 46 12/25/2022 1047    Physical Exam:    VS:  BP (!) 160/78   Pulse 62   Ht 5\' 2"  (1.575 m)   Wt 148 lb 12.8 oz (67.5 kg)   SpO2 99%   BMI 27.22 kg/m     Wt Readings from Last 3 Encounters:  01/17/24 148 lb 12.8 oz (67.5 kg)  11/20/23 145 lb 12.8 oz (66.1 kg)  10/29/23 144 lb (65.3 kg)     GEN: Well nourished, well developed in no acute distress HEENT: Normal NECK: No JVD; No carotid bruits LYMPHATICS: No lymphadenopathy CARDIAC: S1S2 noted,RRR, no murmurs, rubs,  gallops RESPIRATORY:  Clear to auscultation without rales, wheezing or rhonchi  ABDOMEN: Soft, non-tender, non-distended, +bowel sounds, no guarding. EXTREMITIES: No edema, No cyanosis, no clubbing MUSCULOSKELETAL:  No deformity  SKIN: Warm and dry NEUROLOGIC:  Alert and oriented x 3, non-focal PSYCHIATRIC:  Normal affect, good insight  ASSESSMENT:    1. Resistant hypertension   2. Sinus bradycardia   3. Hyperlipidemia LDL goal <100   4. Medication management   5. Mild CAD   6. History of stroke  PLAN:    Hypertension Blood pressure remains elevated despite current regimen- spironolactone 25 mg daily, olmesartan 40 mg daily, chlorothiazide 12.5 mg daily ( recently started by her nephrologist recently). She does not remember any side effect in the past with Amlodipine. She is willing to retry Amlodipinei n attempt t to optimize control. Renal function and potassium levels require monitoring. - Start amlodipine 5 mg daily. - Repeat blood work to check renal function and potassium levels. - Consider alternative antihypertensive medications like clonidine or doxazosin if needed.   Previous elevated creatinine and potassium levels. Current renal function normal. Monitoring required due to spironolactone use. - Repeat blood work to monitor renal function and potassium levels.  Mild Cad - no anginal    HLN - continue with crestor 20 mg daily.  Follow-up Decision pending on continuing care with Dr. Servando Salina or switching to Dr. Duke Salvia for specialized hypertension management. - Schedule follow-up appointment in 6 weeks to assess response to amlodipine.   The patient is in agreement with the above plan. The patient left the office in stable condition.  The patient will follow up in   Medication Adjustments/Labs and Tests Ordered: Current medicines are reviewed at length with the patient today.  Concerns regarding medicines are outlined above.  Orders Placed This Encounter   Procedures   Comprehensive Metabolic Panel (CMET)   Magnesium   Meds ordered this encounter  Medications   amLODipine (NORVASC) 5 MG tablet    Sig: Take 1 tablet (5 mg total) by mouth daily.    Dispense:  90 tablet    Refill:  3    Patient Instructions  Medication Instructions:  Your physician has recommended you make the following change in your medication:  START: Amlodipine 5 mg once daily *If you need a refill on your cardiac medications before your next appointment, please call your pharmacy*  Lab Work: CMET, Mag If you have labs (blood work) drawn today and your tests are completely normal, you will receive your results only by: MyChart Message (if you have MyChart) OR A paper copy in the mail If you have any lab test that is abnormal or we need to change your treatment, we will call you to review the results.  Follow-Up: At [facility], you and your health needs are our priority.  As part of our continuing mission to provide you with exceptional heart care, our providers are all part of one team.  This team includes your primary Cardiologist (physician) and Advanced Practice Providers or APPs (Physician Assistants and Nurse Practitioners) who all work together to provide you with the care you need, when you need it.  Your next appointment:   16 week(s)  Provider:   Thomasene Ripple, DO      Other Instructions:    1st Floor: - Lobby - Registration  - Pharmacy  - Lab - Cafe  2nd Floor: - PV Lab - Diagnostic Testing (echo, CT, nuclear med)  3rd Floor: - Vacant  4th Floor: - TCTS (cardiothoracic surgery) - AFib Clinic - Structural Heart Clinic - Vascular Surgery  - Vascular Ultrasound  5th Floor: - HeartCare Cardiology (general and EP) - Clinical Pharmacy for coumadin, hypertension, lipid, weight-loss medications, and med management appointments    Valet parking services will be available as well.      Adopting a Healthy Lifestyle.  Know  what a healthy weight is for you (roughly BMI <25) and aim to maintain this   Aim for 7+ servings of fruits  and vegetables daily   65-80+ fluid ounces of water or unsweet tea for healthy kidneys   Limit to max 1 drink of alcohol per day; avoid smoking/tobacco   Limit animal fats in diet for cholesterol and heart health - choose grass fed whenever available   Avoid highly processed foods, and foods high in saturated/trans fats   Aim for low stress - take time to unwind and care for your mental health   Aim for 150 min of moderate intensity exercise weekly for heart health, and weights twice weekly for bone health   Aim for 7-9 hours of sleep daily   When it comes to diets, agreement about the perfect plan isnt easy to find, even among the experts. Experts at the [facility] [employer] developed an idea known as the Healthy Eating Plate. Just imagine a plate divided into logical, healthy portions.   The emphasis is on diet quality:   Load up on vegetables and fruits - one-half of your plate: Aim for color and variety, and remember that potatoes dont count.   Go for whole grains - one-quarter of your plate: Whole wheat, barley, wheat berries, quinoa, oats, brown rice, and foods made with them. If you want pasta, go with whole wheat pasta.   Protein power - one-quarter of your plate: Fish, chicken, beans, and nuts are all healthy, versatile protein sources. Limit red meat.   The diet, however, does go beyond the plate, offering a few other suggestions.   Use healthy plant oils, such as olive, canola, soy, corn, sunflower and peanut. Check the labels, and avoid partially hydrogenated oil, which have unhealthy trans fats.   If youre thirsty, drink water. Coffee and tea are good in moderation, but skip sugary drinks and limit milk and dairy products to one or two daily servings.   The type of carbohydrate in the diet is more important than the amount. Some sources of  carbohydrates, such as vegetables, fruits, whole grains, and beans-are healthier than others.   Finally, stay active  Signed, Thomasene Ripple, DO  01/18/2024 11:26 AM    [facility]

## 2024-01-17 NOTE — Patient Instructions (Signed)
 Medication Instructions:  Your physician has recommended you make the following change in your medication:  START: Amlodipine 5 mg once daily *If you need a refill on your cardiac medications before your next appointment, please call your pharmacy*  Lab Work: CMET, Mag If you have labs (blood work) drawn today and your tests are completely normal, you will receive your results only by: MyChart Message (if you have MyChart) OR A paper copy in the mail If you have any lab test that is abnormal or we need to change your treatment, we will call you to review the results.  Follow-Up: At [facility], you and your health needs are our priority.  As part of our continuing mission to provide you with exceptional heart care, our providers are all part of one team.  This team includes your primary Cardiologist (physician) and Advanced Practice Providers or APPs (Physician Assistants and Nurse Practitioners) who all work together to provide you with the care you need, when you need it.  Your next appointment:   16 week(s)  Provider:   Thomasene Ripple, DO      Other Instructions:    1st Floor: - Lobby - Registration  - Pharmacy  - Lab - Cafe  2nd Floor: - PV Lab - Diagnostic Testing (echo, CT, nuclear med)  3rd Floor: - Vacant  4th Floor: - TCTS (cardiothoracic surgery) - AFib Clinic - Structural Heart Clinic - Vascular Surgery  - Vascular Ultrasound  5th Floor: - HeartCare Cardiology (general and EP) - Clinical Pharmacy for coumadin, hypertension, lipid, weight-loss medications, and med management appointments    Valet parking services will be available as well.

## 2024-01-20 ENCOUNTER — Encounter: Payer: Self-pay | Admitting: Internal Medicine

## 2024-01-20 ENCOUNTER — Encounter: Payer: Self-pay | Admitting: Cardiology

## 2024-01-20 ENCOUNTER — Telehealth (HOSPITAL_COMMUNITY): Payer: Self-pay | Admitting: Family

## 2024-01-21 ENCOUNTER — Encounter: Payer: Self-pay | Admitting: Cardiology

## 2024-01-23 NOTE — Telephone Encounter (Signed)
 This has been completed.

## 2024-01-27 ENCOUNTER — Encounter: Payer: Self-pay | Admitting: Cardiology

## 2024-01-27 DIAGNOSIS — M25551 Pain in right hip: Secondary | ICD-10-CM | POA: Insufficient documentation

## 2024-01-27 MED ORDER — TELMISARTAN 40 MG PO TABS: 40.0000 mg | ORAL_TABLET | Freq: Every day | ORAL | 3 refills | Status: AC

## 2024-01-28 ENCOUNTER — Ambulatory Visit (INDEPENDENT_AMBULATORY_CARE_PROVIDER_SITE_OTHER): Admitting: Licensed Clinical Social Worker

## 2024-01-28 DIAGNOSIS — F4310 Post-traumatic stress disorder, unspecified: Secondary | ICD-10-CM | POA: Diagnosis not present

## 2024-01-28 DIAGNOSIS — F411 Generalized anxiety disorder: Secondary | ICD-10-CM

## 2024-01-28 DIAGNOSIS — F332 Major depressive disorder, recurrent severe without psychotic features: Secondary | ICD-10-CM

## 2024-01-28 NOTE — Progress Notes (Signed)
 THERAPIST PROGRESS NOTE   Session Time: 1:00pm - 2:00pm     Location: Patient: [facility]    Provider: [facility]      Participation Level: Active    Behavioral Response: Alert, casually dressed, anxious mood/affect   Type of Therapy:  Individual Therapy   Treatment Goals addressed: Depression/anxiety management; Medication compliance; Improving quality of sleep  Progress Towards Goals: Progressing   Interventions: CBT: self-care assessment    Summary: Debbie Hatfield is a 68 year old widowed Caucasian female that presented today with diagnoses of Major Depressive Disorder, recurrent, severe; Generalized Anxiety Disorder; and PTSD.   Suicidal/Homicidal: None; without intent or plan.     Therapist Response:  Clinician met with Debbie Hatfield today for in person therapy session and assessed for safety, and sobriety.  Debbie Hatfield presented for today's appointment on time and was alert, oriented x5, with no evidence or self-report of active SI/HI or A/V hallucinations.  Debbie Hatfield denied any use of alcohol or illicit substances.  Clinician inquired about Debbie Hatfield's current emotional ratings, as well as any significant changes in thoughts, feelings, or behavior since last check-in.  Debbie Hatfield reported scores of 2/10 for depression, 3/10 for anxiety, and 0/10 for anger/irritability.  Debbie Hatfield denied experiencing any recent panic attacks or outbursts.  Debbie Hatfield reported that a recent success has been noticing an improvement in sleep since implementing strategies mentioned in last session.  She reported that she has been recommended for a hip replacement soon, and has been more focused on improving physical health.  Clinician introduced topic of self-care today.  Clinician explained how this can be defined as the things one does to maintain good health and improve well-being.  Clinician provided Debbie Hatfield with a self-care assessment form to complete.  This handout featured various sub-categories of self-care, including  physical, psychological/emotional, social, spiritual, and professional.  Debbie Hatfield was asked to rank her engagement in the activities listed for each dimension on a scale of 1-3, with 1 indicating 'Poor', 2 indicating 'Ok', and 3 indicating 'Well'.  Clinician invited Debbie Hatfield to share results of her assessment, and inquired about which areas of self-care she is doing well in, as well as areas that require attention, and how she plans to begin addressing this during treatment.  Intervention was effective, as evidenced by Debbie Hatfield successfully completing first section (physical) of assessment and actively engaging in discussion on subject, reporting that she is excelling in areas such as attending preventative medical appointments, but would benefit from focusing more on areas such as exercising, eating regularly, eating healthy foods, and taking care of personal hygiene.  Debbie Hatfield reported that she would work to improve self-care deficits by setting reminders on her phone to eat at least x3 per day, adjust diet to include healthier options such as fruits and vegetables rather than sugary options, and cut back on overall physical activity during the day since this can lead her to pass out when going beyond limits.  Debbie Hatfield reported that she would plan to start making changes this week, stating "I wanna be as active as I can for as long as I can". Clinician will continue to monitor.             Plan: Follow up in 1 week.   Diagnosis: Major Depressive Disorder, recurrent, severe; Generalized Anxiety Disorder; and PTSD.  Collaboration of Care:   None required at this time.  Patient/Guardian was advised Release of Information must be obtained prior to any record release in order to collaborate their care with an outside provider. Patient/Guardian was advised if they have not already done so to contact the registration department to sign all necessary forms in order for us   to release information regarding their care.    Consent: Patient/Guardian gives verbal consent for treatment and assignment of benefits for services provided during this visit. Patient/Guardian expressed understanding and agreed to proceed.   Debbie Hatfield, [place], LCAS 01/28/24

## 2024-02-07 ENCOUNTER — Ambulatory Visit (INDEPENDENT_AMBULATORY_CARE_PROVIDER_SITE_OTHER): Payer: Medicare Other

## 2024-02-07 DIAGNOSIS — R001 Bradycardia, unspecified: Secondary | ICD-10-CM

## 2024-02-07 LAB — CUP PACEART REMOTE DEVICE CHECK
Date Time Interrogation Session: 20250424230531
Implantable Pulse Generator Implant Date: 20220728

## 2024-02-11 NOTE — Progress Notes (Signed)
 Carelink Summary Report / Loop Recorder

## 2024-02-13 ENCOUNTER — Encounter (HOSPITAL_BASED_OUTPATIENT_CLINIC_OR_DEPARTMENT_OTHER): Payer: Self-pay | Admitting: Cardiovascular Disease

## 2024-02-13 ENCOUNTER — Encounter: Payer: Self-pay | Admitting: Cardiology

## 2024-02-13 ENCOUNTER — Ambulatory Visit (INDEPENDENT_AMBULATORY_CARE_PROVIDER_SITE_OTHER): Payer: Medicare Other | Admitting: Cardiovascular Disease

## 2024-02-13 VITALS — BP 167/69 | HR 46 | Ht 62.0 in | Wt 150.8 lb

## 2024-02-13 DIAGNOSIS — E785 Hyperlipidemia, unspecified: Secondary | ICD-10-CM

## 2024-02-13 DIAGNOSIS — N1831 Chronic kidney disease, stage 3a: Secondary | ICD-10-CM | POA: Diagnosis not present

## 2024-02-13 DIAGNOSIS — I10 Essential (primary) hypertension: Secondary | ICD-10-CM | POA: Diagnosis not present

## 2024-02-13 DIAGNOSIS — R001 Bradycardia, unspecified: Secondary | ICD-10-CM

## 2024-02-13 DIAGNOSIS — R400 Somnolence: Secondary | ICD-10-CM

## 2024-02-13 DIAGNOSIS — R0683 Snoring: Secondary | ICD-10-CM

## 2024-02-13 DIAGNOSIS — I63311 Cerebral infarction due to thrombosis of right middle cerebral artery: Secondary | ICD-10-CM | POA: Diagnosis not present

## 2024-02-13 MED ORDER — AMLODIPINE BESYLATE 2.5 MG PO TABS: 2.5000 mg | ORAL_TABLET | Freq: Every day | ORAL | 3 refills | Status: DC

## 2024-02-13 NOTE — Progress Notes (Signed)
 [facility] Initial Assessment:    Date:  02/13/2024   ID:  Debbie Hatfield, DOB 1956-08-24, MRN [id number]  PCP:  Arcadio Knuckles, MD  Cardiologist:  Zoe Hinds, MD   Referring MD: Manfred Seed, MD   CC: Hypertension  History of Present Illness:    Debbie Hatfield is a 68 y.o. female with a hx of resistant hypertension CVA, hyperlipidemia, nonobstructive CAD, and hyperlipidemia here to establish care in the [facility].  She has a history of stroke of the middle cerebral artery.  She was incidentally found to have a PFO that was not thought to be.  She had been following with cardiology and last saw Dr. Emmette Harms 01/2024.  Blood pressure has been uncontrolled despite most recently being on spironolactone , telmisartan , and chlorthalidone.  Has been unable to be on beta-blockers due to bradycardia. Discussed the use of AI scribe software for clinical note transcription with the patient, who gave verbal consent to proceed.  History of Present Illness Debbie Hatfield is a 68 year old female with hypertension and bradycardia who presents for management of blood pressure fluctuations and episodes of bradycardia. She is accompanied by her sister, Brian Campanile. She was referred by Dr. Sandee Crook for management of her blood pressure issues.  For the past two years, she has experienced significant fluctuations in blood pressure, with readings often reaching 200/100 mmHg. Spironolactone  was reduced after she developed hyperkalemia on 75mg .  She is currently on Norvasc , which has improved blood pressure stability and reduced chest heaviness. She experiences episodes of lightheadedness and fatigue four to five times a week and has adjusted her fluid intake to manage these symptoms.  In December, she experienced an episode of bradycardia with a heart rate of 40 bpm and blood pressure of 70/40 mmHg, lasting two hours, requiring emergency intervention with an epinephrine  drip. She has a  history of bradycardia and has a loop recorder implanted, which has recorded frequent episodes of low heart rate. She feels symptomatic during these episodes, including lightheadedness and fatigue. Her family history includes several members with pacemakers, and her mother had a third-degree heart block.  She has a history of a stress fracture in the second and third metatarsals, with a bone density test scheduled. She also has a Morton's neuroma and experiences significant foot pain.  She reports profound snoring and night terrors affecting sleep quality. She has been seeing behavioral health for worsening depression. Her diet is high in salt, with occasional alcohol consumption and a history of sweet tea and Mountain Dew intake, which she has reduced. She engages in regular physical activity, walking her dog daily. 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  Previous antihypertensives: hctZ Lisinopril/hydrochlorothiazide  Clonidine  Nebivolol  Clonidine    Past Medical History:  Diagnosis Date   AKI (acute kidney injury) (HCC) 10/03/2023   Closed fracture of right distal radius    Degeneration of lumbar intervertebral disc 01/26/2020   Depression with anxiety 10/03/2023   GAD (generalized anxiety disorder) 11/15/2021   Gastroparesis    GERD (gastroesophageal reflux disease)    History of CVA (cerebrovascular accident) 10/03/2023   Hyperlipidemia LDL goal <100 03/26/2022   The ASCVD Risk score (Arnett DK, et al., 2019) failed to calculate for the following reasons:    The valid HDL cholesterol range is 20 to 100 mg/dL     Hypertension    Hypotension 10/03/2023  Irritable bowel disease    Lupus    Major depression    Morton's neuroma of left foot 08/30/2023   Near syncope 10/03/2023   Need for shingles vaccine 11/15/2021   Need for vaccination 11/15/2021   Nonrheumatic mitral valve regurgitation  05/10/2022   Osteopenia 12/14/2015   Peptic ulcer disease 12/14/2015   Recurrent major depressive disorder, in partial remission (HCC) 11/15/2021   Sinus bradycardia 10/03/2023   Stage 3a chronic kidney disease (HCC) 11/15/2021   Estimated Creatinine Clearance: 49.7 mL/min (by C-G formula based on SCr of 1.01 mg/dL).      Estimated Creatinine Clearance: 45.9 mL/min (A) (by C-G formula based on SCr of 1.07 mg/dL (H)).      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   Stroke [facility])    Trigeminal neuralgia    Visit for screening mammogram 11/15/2021    Past Surgical History:  Procedure Laterality Date   BUBBLE STUDY  07/05/2022   Procedure: BUBBLE STUDY;  Surgeon: Euell Herrlich, MD;  Location: [facility] ENDOSCOPY;  Service: Cardiology;;   CARPAL TUNNEL RELEASE Left 05/15/2023   CHOLECYSTECTOMY     COLONOSCOPY     LOOP RECORDER IMPLANT  2021   ORIF WRIST FRACTURE Right 09/22/2020   Procedure: OPEN REDUCTION INTERNAL FIXATION (ORIF) WRIST FRACTURE;  Surgeon: Oralia Bills, MD;  Location: [facility];  Service: Orthopedics;  Laterality: Right;    TEE WITHOUT CARDIOVERSION N/A 07/05/2022   Procedure: TRANSESOPHAGEAL ECHOCARDIOGRAM (TEE);  Surgeon: Euell Herrlich, MD;  Location: [facility] ENDOSCOPY;  Service: Cardiology;  Laterality: N/A;    Current Medications: Current Meds  Medication Sig   ACETAMINOPHEN  PO Take 1,300 mg by mouth in the morning and at bedtime.   acyclovir  (ZOVIRAX ) 400 MG tablet Take 1 tablet (400 mg total) by mouth daily.   amLODipine  (NORVASC ) 2.5 MG tablet Take 1 tablet (2.5 mg total) by mouth at bedtime. CONTINUE 5 MG TABLETS IN MORNING   amLODipine  (NORVASC ) 5 MG tablet Take 1 tablet (5 mg total) by mouth daily.   clonazePAM  (KLONOPIN ) 0.5 MG tablet Take 2 tablets (1 mg total) by mouth at bedtime as needed for anxiety.   clopidogrel  (PLAVIX ) 75 MG tablet One po qd   doxepin  (SINEQUAN ) 50 MG capsule Take 1 capsule (50 mg total) by mouth at bedtime.    gabapentin  (NEURONTIN ) 300 MG capsule Take 600 mg by mouth 2 (two) times daily.   pantoprazole  (PROTONIX ) 40 MG tablet Take 1 tablet (40 mg total) by mouth 2 (two) times daily.   spironolactone  (ALDACTONE ) 25 MG tablet Take 1 tablet (25 mg total) by mouth daily.   telmisartan  (MICARDIS ) 40 MG tablet Take 1 tablet (40 mg total) by mouth daily.   tizanidine  (ZANAFLEX ) 2 MG capsule Take 4 mg by mouth at bedtime.   traMADol  (ULTRAM ) 50 MG tablet Take 50 mg by mouth.   venlafaxine  (EFFEXOR ) 37.5 MG tablet Take 1 tablet (37.5 mg total) by mouth 2 (two) times daily.     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[employer]  Tobacco Use   Smoking status: Never    Passive exposure: Never   Smokeless tobacco:  Never  Vaping Use   Vaping status: Never Used  Substance and Sexual Activity   Alcohol use: Yes    Comment: social   Drug use: Never   Sexual activity: Not Currently    Birth control/protection: Post-menopausal  Other Topics Concern   Not on file  Social History Narrative   06/13/22 living with her mother   2 [place] per Week   Social Drivers of Health   Financial Resource Strain: Medium Risk (07/27/2023)   Overall Financial Resource Strain (CARDIA)    Difficulty of Paying Living Expenses: Somewhat hard  Food Insecurity: No Food Insecurity (10/03/2023)   Hunger Vital Sign    Worried About Running Out of Food in the Last Year: Never true    Ran Out of Food in the Last Year: Never true  Transportation Needs: No Transportation Needs (10/03/2023)   PRAPARE - Administrator, Civil Service (Medical): No    Lack of Transportation  (Non-Medical): No  Physical Activity: Sufficiently Active (07/27/2023)   Exercise Vital Sign    Days of Exercise per Week: 4 days    Minutes of Exercise per Session: 40 min  Stress: Stress Concern Present (07/27/2023)   [employer] of Occupational Health - Occupational Stress Questionnaire    Feeling of Stress : Very much  Social Connections: Socially Isolated (07/27/2023)   Social Connection and Isolation Panel [NHANES]    Frequency of Communication with Friends and Family: Three times a week    Frequency of Social Gatherings with Friends and Family: Twice a week    Attends Religious Services: Never    Database administrator or Organizations: No    Attends Engineer, structural: Not on file    Marital Status: Widowed     Family History: The patient's family history includes Alcoholism in her father; Aneurysm in her mother; Colon cancer in her maternal grandfather; Congestive Heart Failure in her maternal grandfather and maternal grandmother; Depression in her mother; Diabetes in her maternal grandmother; Gout in her mother; Heart block in her mother; Heart failure in her maternal grandmother; Hypertension in her maternal grandmother, mother, and paternal grandfather; Macular degeneration in her mother; Stroke in her paternal grandfather. There is no history of Esophageal cancer, Stomach cancer, or Rectal cancer.  ROS:   Please see the history of present illness.    All other systems reviewed and are negative.  EKGs/Labs/Other Studies Reviewed:    EKG:  EKG is not ordered today.   Recent Labs: 10/02/2023: B Natriuretic Peptide 69.5; TSH 1.886 10/29/2023: Hemoglobin 11.3; Platelets 295.0 01/17/2024: ALT 17; BUN 28; Creatinine, Ser 1.37; Magnesium 2.2; Potassium 5.0; Sodium 140   Recent Lipid Panel    Component Value Date/Time   CHOL 164 12/25/2022 1047   TRIG 84 12/25/2022 1047   HDL 103 12/25/2022 1047   CHOLHDL 1.6 12/25/2022 1047   CHOLHDL 2.2 05/08/2022 0424    VLDL 24 05/08/2022 0424   LDLCALC 46 12/25/2022 1047    Physical Exam:   VS:  BP (!) 167/69   Pulse (!) 46   Ht 5\' 2"  (1.575 m)   Wt 150 lb 12.8 oz (68.4 kg)   SpO2 98%   BMI 27.58 kg/m  , BMI Body mass index is 27.58 kg/m. GENERAL:  Well appearing HEENT: Pupils equal round and reactive, fundi not visualized, oral mucosa unremarkable NECK:  No jugular venous distention, waveform within normal limits, carotid upstroke brisk and symmetric, no bruits, no thyromegaly LUNGS:  Clear  to auscultation bilaterally HEART:  RRR.  PMI not displaced or sustained,S1 and S2 within normal limits, no S3, no S4, no clicks, no rubs, no murmurs ABD:  Flat, positive bowel sounds normal in frequency in pitch, no bruits, no rebound, no guarding, no midline pulsatile mass, no hepatomegaly, no splenomegaly EXT:  2 plus pulses throughout, no edema, no cyanosis no clubbing SKIN:  No rashes no nodules NEURO:  Cranial nerves II through XII grossly intact, motor grossly intact throughout PSYCH:  Cognitively intact, oriented to person place and time   ASSESSMENT/PLAN:    Assessment & Plan # Hypertension Resistant hypertension with fluctuating blood pressure. Secondary causes like pheochromocytoma and sleep apnea considered. Concerns about hypotension with medication increase. - Order blood test to rule out pheochromocytoma.  Catecholamines and metanephrines.  - Increase amlodipine  to 7.5 mg daily, split into 5 mg in the morning and 2.5 mg at night. - Provide blood pressure monitoring sheet. - Advise on dietary salt reduction to less than 2300 mg per day. - Encourage regular exercise, aiming for at least 150 minutes per week.  # Bradycardia Intermittent bradycardia with symptomatic episodes. Loop recorder in place. Possible pacemaker if symptomatic bradycardia confirmed. - Advise reporting symptomatic episodes for correlation with heart rate data.  # Sleep apnea Suspected sleep apnea due to significant  snoring and daytime fatigue. Potential contributor to resistant hypertension. - Order home sleep study to evaluate for sleep apnea.  # Stroke Stroke in 2023, possibly due to antiphospholipid syndrome. Discontinued rosuvastatin  due to myalgias. Current lipid levels excellent. - Order fasting lipid panel to reassess cholesterol levels.  # Dysphagia Recurrent choking episodes with history of esophageal dilation. Possible need for repeat dilation. - Refer to gastroenterology for evaluation and possible repeat esophageal dilation.  # Lupus Lupus may contribute to overall fatigue and malaise.  # Depression Worsening depression, likely exacerbated by inability to return to work post-stroke. Under behavioral health care.  # Stress fracture of metatarsals Recent stress fracture of second and third metatarsals confirmed by x-ray. Bone density test scheduled. - Proceed with scheduled bone density test.  Screening for Secondary Hypertension:     02/13/2024    9:45 AM  Causes  Drugs/Herbals Screened     - Comments some caffeine.  doesn't limit salt.  rare EtOH.  no supplements  Renovascular HTN Screened     - Comments renal Dopplers negative  Sleep Apnea Screened  Thyroid  Disease Screened  Hyperaldosteronism Screened     - Comments renin/aldo normal 2024  Pheochromocytoma Screened     - Comments check catecholamines and metanephrines  Cushing's Syndrome N/A  Hyperparathyroidism Screened  Coarctation of the Aorta Screened     - Comments BP symmetric  Compliance Screened    Relevant Labs/Studies:    Latest Ref Rng & Units 01/17/2024   11:32 AM 10/29/2023    2:36 PM 10/18/2023   11:05 AM  Basic Labs  Sodium 134 - 144 mmol/L 140  141  140   Potassium 3.5 - 5.2 mmol/L 5.0  5.1  4.9   Creatinine 0.57 - 1.00 mg/dL 4.09  8.11  9.14        Latest Ref Rng & Units 10/02/2023    8:13 PM 01/21/2023   10:59 AM  Thyroid    TSH 0.350 - 4.500 uIU/mL 1.886  2.11        Latest Ref Rng & Units  10/18/2023   11:05 AM 05/10/2022    2:52 PM  Renin/Aldosterone   Aldosterone 0.0 -  30.0 ng/dL 6.8  3   Aldos/Renin Ratio 0.0 - 30.0 3.7               09/10/2023    2:48 PM  Renovascular   Renal Artery US  Completed Yes        Disposition:    FU with MD/PharmD in 2 months    Medication Adjustments/Labs and Tests Ordered: Current medicines are reviewed at length with the patient today.  Concerns regarding medicines are outlined above.  Orders Placed This Encounter  Procedures   Metanephrines, plasma   Catecholamines, fractionated, plasma   Comprehensive metabolic panel with GFR   Lipid panel   [facility] Sleep Study   Meds ordered this encounter  Medications   amLODipine  (NORVASC ) 2.5 MG tablet    Sig: Take 1 tablet (2.5 mg total) by mouth at bedtime. CONTINUE 5 MG TABLETS IN MORNING    Dispense:  90 tablet    Refill:  3     Signed, [name], MD  02/13/2024 10:32 AM    [facility]

## 2024-02-13 NOTE — Patient Instructions (Signed)
 Medication Instructions:  START AMLODIPINE  2.5 MG AT BEDTIME  CONTINUE THE 5 MG IN THE MORNING    Labwork: FASTING LP/CMET/CATACHOLAMINES/METANEPHRINES SOON    Testing/Procedures: [facility] HOME SLEEP STUDY SOON    Follow-Up: 1 TO 2 MONTHS IN ADV HTN WITH CAITLIN W NP, KRISTIN A PHARM D, OR DR [place]    Special Instructions:  MONITOR YOUR BLOOD PRESSURE TWICE A DAY, LOG IN THE BOOK PROVIDED. BRING THE BOOK AND YOUR BLOOD PRESSURE MACHINE TO YOUR FOLLOW UP   WatchPAT?  Is a FDA cleared portable home sleep study test that uses a watch and 3 points of contact to monitor 7 different channels, including your heart rate, oxygen saturations, body position, snoring, and chest motion.  The study is easy to use from the comfort of your own home and accurately detect sleep apnea.  Before bed, you attach the chest sensor, attached the sleep apnea bracelet to your nondominant hand, and attach the finger probe.  After the study, the raw data is downloaded from the watch and scored for apnea events.   For more information: [email or web address]  Patient Testing Instructions:  Do not put battery into the device until bedtime when you are ready to begin the test. Please call the support number if you need assistance after following the instructions below: 24 hour support line- [phone] or [facility] support at [phone] (option 2)  Download the IntelWatchPAT One" app through the google play store or App Store  Be sure to turn on or enable access to bluetooth in settlings on your smartphone/ device  Make sure no other bluetooth devices are on and within the vicinity of your smartphone/ device and WatchPAT watch during testing.  Make sure to leave your smart phone/ device plugged in and charging all night.  When ready for bed:  Follow the instructions step by step in the WatchPAT One App to activate the testing device. For additional instructions, including video instruction, visit  the WatchPAT One video on Youtube. You can search for WatchPat One within Youtube (video is 4 minutes and 18 seconds) or enter: https://youtube/watch?[email or web address]_vbiwxE Please note: You will be prompted to enter a Pin to connect via bluetooth when starting the test. The PIN will be assigned to you when you receive the test.  The device is disposable, but it recommended that you retain the device until you receive a call letting you know the study has been received and the results have been interpreted.  We will let you know if the study did not transmit to us  properly after the test is completed. You do not need to call us  to confirm the receipt of the test.  Please complete the test within 48 hours of receiving PIN.   Frequently Asked Questions:  What is Watch Deatra Face one?  A single use fully disposable home sleep apnea testing device and will not need to be returned after completion.  What are the requirements to use WatchPAT one?  The be able to have a successful watchpat one sleep study, you should have your Watch pat one device, your smart phone, watch pat one app, your PIN number and Internet access What type of phone do I need?  You should have a smart phone that uses Android 5.1 and above or any Iphone with IOS 10 and above How can I download the WatchPAT one app?  Based on your device type search for WatchPAT one app either in google play for android devices or APP store for Iphone's Where will  I get my PIN for the study?  Your PIN will be provided by your physician's office. It is used for authentication and if you lose/forget your PIN, please reach out to your providers office.  I do not have Internet at home. Can I do WatchPAT one study?  WatchPAT One needs Internet connection throughout the night to be able to transmit the sleep data. You can use your home/local internet or your cellular's data package. However, it is always recommended to use home/local Internet. It is estimated that between  20MB-30MB will be used with each study.However, the application will be looking for space in the phone to start the study.  What happens if I lose internet or bluetooth connection?  During the internet disconnection, your phone will not be able to transmit the sleep data. All the data, will be stored in your phone. As soon as the internet connection is back on, the phone will being sending the sleep data. During the bluetooth disconnection, WatchPAT one will not be able to to send the sleep data to your phone. Data will be kept in the WatchPAT one until two devices have bluetooth connection back on. As soon as the connection is back on, WatchPAT one will send the sleep data to the phone.  How long do I need to wear the WatchPAT one?  After you start the study, you should wear the device at least 6 hours.  How far should I keep my phone from the device?  During the night, your phone should be within 15 feet.  What happens if I leave the room for restroom or other reasons?  Leaving the room for any reason will not cause any problem. As soon as your get back to the room, both devices will reconnect and will continue to send the sleep data. Can I use my phone during the sleep study?  Yes, you can use your phone as usual during the study. But it is recommended to put your watchpat one on when you are ready to go to bed.  How will I get my study results?  A soon as you completed your study, your sleep data will be sent to the provider. They will then share the results with you when they are ready.

## 2024-02-13 NOTE — Progress Notes (Deleted)
 Advanced [facility]pertension [facility] Initial Assessment:    Date:  02/13/2024   ID:  [name], DOB September 24, 1956, MRN [id number]  PCP:  Arcadio Knuckles, MD  Cardiologist:  Zoe Hinds, MD  Nephrologist:  Referring MD: Manfred Seed, MD   CC: Hypertension  History of Present Illness:    Debbie Hatfield is a 68 y.o. female with a hx of *** here to establish care in the [facility].   Discussed the use of AI scribe software for clinical note transcription with the patient, who gave verbal consent to proceed.  History of Present Illness      Previous antihypertensives:  Secondary Causes of Hypertension  Medications/Herbal: OCP, steroids, stimulants, antidepressants, weight loss medication, immune suppressants, NSAIDs, sympathomimetics, alcohol, caffeine, licorice, ginseng, St. John's wort, chemo  Sleep Apnea Renal artery stenosis Hyperaldosteronism Hyper/hypothyroidism Pheochromocytoma: palpitations, tachycardia, headache, diaphoresis (plasma metanephrines) Cushing's syndrome: Cushingoid facies, central obesity, proximal muscle weakness, and ecchymoses, adrenal incidentaloma (cortisol) Coarctation of the aorta  Past Medical History:  Diagnosis Date   AKI (acute kidney injury) (HCC) 10/03/2023   Closed fracture of right distal radius    Degeneration of lumbar intervertebral disc 01/26/2020   Depression with anxiety 10/03/2023   GAD (generalized anxiety disorder) 11/15/2021   Gastroparesis    GERD (gastroesophageal reflux disease)    History of CVA (cerebrovascular accident) 10/03/2023   Hyperlipidemia LDL goal <100 03/26/2022   The ASCVD Risk score (Arnett DK, et al., 2019) failed to calculate for the following reasons:    The valid HDL cholesterol range is 20 to 100 mg/dL     Hypertension    Hypotension 10/03/2023   Irritable bowel disease    Lupus    Major depression    Morton's neuroma of left foot 08/30/2023   Near syncope 10/03/2023   Need  for shingles vaccine 11/15/2021   Need for vaccination 11/15/2021   Nonrheumatic mitral valve regurgitation 05/10/2022   Osteopenia 12/14/2015   Peptic ulcer disease 12/14/2015   Recurrent major depressive disorder, in partial remission (HCC) 11/15/2021   Sinus bradycardia 10/03/2023   Stage 3a chronic kidney disease (HCC) 11/15/2021   Estimated Creatinine Clearance: 49.7 mL/min (by C-G formula based on SCr of 1.01 mg/dL).      Estimated Creatinine Clearance: 45.9 mL/min (A) (by C-G formula based on SCr of 1.07 mg/dL (H)).      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   Stroke [facility])    Trigeminal neuralgia    Visit for screening mammogram 11/15/2021    Past Surgical History:  Procedure Laterality Date   BUBBLE STUDY  07/05/2022   Procedure: BUBBLE STUDY;  Surgeon: Euell Herrlich, MD;  Location: [facility] ENDOSCOPY;  Service: Cardiology;;   CARPAL TUNNEL RELEASE Left 05/15/2023   CHOLECYSTECTOMY     COLONOSCOPY     LOOP RECORDER IMPLANT  2021   ORIF WRIST FRACTURE Right 09/22/2020   Procedure: OPEN REDUCTION INTERNAL FIXATION (ORIF) WRIST FRACTURE;  Surgeon: Oralia Bills, MD;  Location: [facility];  Service: Orthopedics;  Laterality: Right;    TEE WITHOUT CARDIOVERSION N/A 07/05/2022   Procedure: TRANSESOPHAGEAL ECHOCARDIOGRAM (TEE);  Surgeon: Euell Herrlich, MD;  Location: [facility] ENDOSCOPY;  Service: Cardiology;  Laterality: N/A;    Current Medications: No outpatient medications have been marked as taking for the 02/13/24 encounter (Appointment) with [name], MD.     Allergies:   Plaquenil  [hydroxychloroquine ], Sulfa antibiotics, Sulfamethoxazole-trimethoprim, and Penicillins   Social History   Socioeconomic History  Marital status: Widowed    Spouse name: Not on file   Number of children: 0   Years of education: Not on file   Highest education level: Bachelor's degree (e.g., BA, AB, BS)  Occupational History    Comment: RN ortho/neuro at  [employer]  Tobacco Use   Smoking status: Never    Passive exposure: Never   Smokeless tobacco: Never  Vaping Use   Vaping status: Never Used  Substance and Sexual Activity   Alcohol use: Yes    Comment: social   Drug use: Never   Sexual activity: Not Currently    Birth control/protection: Post-menopausal  Other Topics Concern   Not on file  Social History Narrative   06/13/22 living with her mother   2 [place] Dew per Week   Social Drivers of Health   Financial Resource Strain: Medium Risk (07/27/2023)   Overall Financial Resource Strain (CARDIA)    Difficulty of Paying Living Expenses: Somewhat hard  Food Insecurity: No Food Insecurity (10/03/2023)   Hunger Vital Sign    Worried About Running Out of Food in the Last Year: Never true    Ran Out of Food in the Last Year: Never true  Transportation Needs: No Transportation Needs (10/03/2023)   PRAPARE - Administrator, Civil Service (Medical): No    Lack of Transportation (Non-Medical): No  Physical Activity: Sufficiently Active (07/27/2023)   Exercise Vital Sign    Days of Exercise per Week: 4 days    Minutes of Exercise per Session: 40 min  Stress: Stress Concern Present (07/27/2023)   [employer] of Occupational Health - Occupational Stress Questionnaire    Feeling of Stress : Very much  Social Connections: Socially Isolated (07/27/2023)   Social Connection and Isolation Panel [NHANES]    Frequency of Communication with Friends and Family: Three times a week    Frequency of Social Gatherings with Friends and Family: Twice a week    Attends Religious Services: Never    Database administrator or Organizations: No    Attends Engineer, structural: Not on file    Marital Status: Widowed     Family History: The patient's ***family history includes Alcoholism in her father; Aneurysm in her mother; Colon cancer in her maternal grandfather; Congestive Heart Failure in her maternal grandfather and  maternal grandmother; Depression in her mother; Diabetes in her maternal grandmother; Gout in her mother; Heart block in her mother; Hypertension in her mother and paternal grandfather; Macular degeneration in her mother; Stroke in her paternal grandfather. There is no history of Esophageal cancer, Stomach cancer, or Rectal cancer.  ROS:   Please see the history of present illness.    *** All other systems reviewed and are negative.  EKGs/Labs/Other Studies Reviewed:    EKG:  EKG is *** ordered today.  The ekg ordered today demonstrates ***  Recent Labs: 10/02/2023: B Natriuretic Peptide 69.5; TSH 1.886 10/29/2023: Hemoglobin 11.3; Platelets 295.0 01/17/2024: ALT 17; BUN 28; Creatinine, Ser 1.37; Magnesium 2.2; Potassium 5.0; Sodium 140   Recent Lipid Panel    Component Value Date/Time   CHOL 164 12/25/2022 1047   TRIG 84 12/25/2022 1047   HDL 103 12/25/2022 1047   CHOLHDL 1.6 12/25/2022 1047   CHOLHDL 2.2 05/08/2022 0424   VLDL 24 05/08/2022 0424   LDLCALC 46 12/25/2022 1047    Physical Exam:   VS:  There were no vitals taken for this visit. , BMI There is no height or weight  on file to calculate BMI. GENERAL:  Well appearing HEENT: Pupils equal round and reactive, fundi not visualized, oral mucosa unremarkable NECK:  No jugular venous distention, waveform within normal limits, carotid upstroke brisk and symmetric, no bruits, no thyromegaly LYMPHATICS:  No cervical adenopathy LUNGS:  Clear to auscultation bilaterally HEART:  RRR.  PMI not displaced or sustained,S1 and S2 within normal limits, no S3, no S4, no clicks, no rubs, *** murmurs ABD:  Flat, positive bowel sounds normal in frequency in pitch, no bruits, no rebound, no guarding, no midline pulsatile mass, no hepatomegaly, no splenomegaly EXT:  2 plus pulses throughout, no edema, no cyanosis no clubbing SKIN:  No rashes no nodules NEURO:  Cranial nerves II through XII grossly intact, motor grossly intact throughout PSYCH:   Cognitively intact, oriented to person place and time   ASSESSMENT/PLAN:    No problem-specific Assessment & Plan notes found for this encounter.   Screening for Secondary Hypertension: { Click here to document screening for secondary causes of HTN  :[id number]}    Relevant Labs/Studies:    Latest Ref Rng & Units 01/17/2024   11:32 AM 10/29/2023    2:36 PM 10/18/2023   11:05 AM  Basic Labs  Sodium 134 - 144 mmol/L 140  141  140   Potassium 3.5 - 5.2 mmol/L 5.0  5.1  4.9   Creatinine 0.57 - 1.00 mg/dL 4.09  8.11  9.14        Latest Ref Rng & Units 10/02/2023    8:13 PM 01/21/2023   10:59 AM  Thyroid    TSH 0.350 - 4.500 uIU/mL 1.886  2.11        Latest Ref Rng & Units 10/18/2023   11:05 AM 05/10/2022    2:52 PM  Renin/Aldosterone   Aldosterone 0.0 - 30.0 ng/dL 6.8  3   Aldos/Renin Ratio 0.0 - 30.0 3.7               09/10/2023    2:48 PM  Renovascular   Renal Artery US  Completed Yes        Disposition:    FU with MD/PharmD in {gen number [id number]} {Days to years:10300}    Medication Adjustments/Labs and Tests Ordered: Current medicines are reviewed at length with the patient today.  Concerns regarding medicines are outlined above.  No orders of the defined types were placed in this encounter.  No orders of the defined types were placed in this encounter.    Signed, [name], MD  02/13/2024 9:12 AM    [facility]   Previous antihypertensives:  Secondary Causes of Hypertension  Medications/Herbal: OCP, steroids, stimulants, antidepressants, weight loss medication, immune suppressants, NSAIDs, sympathomimetics, alcohol, caffeine, licorice, ginseng, St. John's wort, chemo  Sleep Apnea Renal artery stenosis Hyperaldosteronism Hyper/hypothyroidism Pheochromocytoma: palpitations, tachycardia, headache, diaphoresis (plasma metanephrines) Cushing's syndrome: Cushingoid facies, central obesity, proximal muscle weakness, and  ecchymoses, adrenal incidentaloma (cortisol) Coarctation of the aorta  Past Medical History:  Diagnosis Date   AKI (acute kidney injury) (HCC) 10/03/2023   Closed fracture of right distal radius    Degeneration of lumbar intervertebral disc 01/26/2020   Depression with anxiety 10/03/2023   GAD (generalized anxiety disorder) 11/15/2021   Gastroparesis    GERD (gastroesophageal reflux disease)    History of CVA (cerebrovascular accident) 10/03/2023   Hyperlipidemia LDL goal <100 03/26/2022   The ASCVD Risk score (Arnett DK, et al., 2019) failed to calculate for the following reasons:    The valid HDL cholesterol  range is 20 to 100 mg/dL     Hypertension    Hypotension 10/03/2023   Irritable bowel disease    Lupus    Major depression    Morton's neuroma of left foot 08/30/2023   Near syncope 10/03/2023   Need for shingles vaccine 11/15/2021   Need for vaccination 11/15/2021   Nonrheumatic mitral valve regurgitation 05/10/2022   Osteopenia 12/14/2015   Peptic ulcer disease 12/14/2015   Recurrent major depressive disorder, in partial remission (HCC) 11/15/2021   Sinus bradycardia 10/03/2023   Stage 3a chronic kidney disease (HCC) 11/15/2021   Estimated Creatinine Clearance: 49.7 mL/min (by C-G formula based on SCr of 1.01 mg/dL).      Estimated Creatinine Clearance: 45.9 mL/min (A) (by C-G formula based on SCr of 1.07 mg/dL (H)).      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   Stroke [facility])    Trigeminal neuralgia    Visit for screening mammogram 11/15/2021    Past Surgical History:  Procedure Laterality Date   BUBBLE STUDY  07/05/2022   Procedure: BUBBLE STUDY;  Surgeon: Euell Herrlich, MD;  Location: [facility] ENDOSCOPY;  Service: Cardiology;;   CARPAL TUNNEL RELEASE Left 05/15/2023   CHOLECYSTECTOMY     COLONOSCOPY     LOOP RECORDER IMPLANT  2021   ORIF WRIST FRACTURE Right 09/22/2020   Procedure: OPEN REDUCTION INTERNAL FIXATION (ORIF) WRIST FRACTURE;  Surgeon: Oralia Bills, MD;  Location: [facility];  Service: Orthopedics;  Laterality: Right;    TEE WITHOUT CARDIOVERSION N/A 07/05/2022   Procedure: TRANSESOPHAGEAL ECHOCARDIOGRAM (TEE);  Surgeon: Euell Herrlich, MD;  Location: [facility] ENDOSCOPY;  Service: Cardiology;  Laterality: N/A;    Current Medications: No outpatient medications have been marked as taking for the 02/13/24 encounter (Appointment) with [name], MD.     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[employer]  Tobacco Use   Smoking status: Never    Passive exposure: Never   Smokeless tobacco: Never  Vaping Use   Vaping status: Never Used  Substance and Sexual Activity   Alcohol use: Yes    Comment: social   Drug use: Never   Sexual activity: Not Currently    Birth control/protection: Post-menopausal  Other Topics Concern   Not on file  Social History Narrative   06/13/22 living with her mother   2 [place] per Week   Social Drivers of Health   Financial Resource Strain: Medium Risk (07/27/2023)   Overall Financial Resource Strain (CARDIA)    Difficulty of Paying Living Expenses: Somewhat hard  Food Insecurity: No Food Insecurity (10/03/2023)   Hunger Vital Sign    Worried About Running Out of Food in the Last Year: Never true    Ran Out of Food in the Last Year: Never true  Transportation Needs: No Transportation Needs (10/03/2023)   PRAPARE - Administrator, Civil Service (Medical): No    Lack of Transportation (Non-Medical): No  Physical Activity: Sufficiently Active (07/27/2023)   Exercise Vital Sign    Days of Exercise per Week: 4 days     Minutes of Exercise per Session: 40 min  Stress: Stress Concern Present (07/27/2023)   [employer] of Occupational Health - Occupational Stress Questionnaire    Feeling of Stress : Very much  Social Connections: Socially Isolated (07/27/2023)   Social Connection and Isolation Panel [NHANES]    Frequency of Communication with Friends and Family: Three times a week    Frequency of Social Gatherings with Friends and Family: Twice a week    Attends Religious Services: Never    Database administrator or Organizations: No    Attends Engineer, structural: Not on file    Marital Status: Widowed     Family History: The patient's ***family history includes Alcoholism in her father; Aneurysm in her mother; Colon cancer in her maternal grandfather; Congestive Heart Failure in her maternal grandfather and maternal grandmother; Depression in her mother; Diabetes in her maternal grandmother; Gout in her mother; Heart block in her mother; Hypertension in her mother and paternal grandfather; Macular degeneration in her mother; Stroke in her paternal grandfather. There is no history of Esophageal cancer, Stomach cancer, or Rectal cancer.  ROS:   Please see the history of present illness.    *** All other systems reviewed and are negative.  EKGs/Labs/Other Studies Reviewed:    EKG:  EKG is *** ordered today.  The ekg ordered today demonstrates ***  Recent Labs: 10/02/2023: B Natriuretic Peptide 69.5; TSH 1.886 10/29/2023: Hemoglobin 11.3; Platelets 295.0 01/17/2024: ALT 17; BUN 28; Creatinine, Ser 1.37; Magnesium 2.2; Potassium 5.0; Sodium 140   Recent Lipid Panel    Component Value Date/Time   CHOL 164 12/25/2022 1047   TRIG 84 12/25/2022 1047   HDL 103 12/25/2022 1047   CHOLHDL 1.6 12/25/2022 1047   CHOLHDL 2.2 05/08/2022 0424   VLDL 24 05/08/2022 0424   LDLCALC 46 12/25/2022 1047    Physical Exam:   VS:  There were no vitals taken for this visit. , BMI There is no height or  weight on file to calculate BMI. GENERAL:  Well appearing HEENT: Pupils equal round and reactive, fundi not visualized, oral mucosa unremarkable NECK:  No jugular venous distention, waveform within normal limits, carotid upstroke brisk and symmetric, no bruits, no thyromegaly LYMPHATICS:  No cervical adenopathy LUNGS:  Clear to auscultation bilaterally HEART:  RRR.  PMI not displaced or sustained,S1 and S2 within normal limits, no S3, no S4, no clicks, no rubs, *** murmurs ABD:  Flat, positive bowel sounds normal in frequency in pitch, no bruits, no rebound, no guarding, no midline pulsatile mass, no hepatomegaly, no splenomegaly EXT:  2 plus pulses throughout, no edema, no cyanosis no clubbing SKIN:  No rashes no nodules NEURO:  Cranial nerves II through XII grossly intact, motor grossly intact throughout PSYCH:  Cognitively intact, oriented to person place and time   ASSESSMENT/PLAN:    Assessment and Plan Assessment & Plan      No problem-specific Assessment & Plan notes found for this encounter.   Screening for Secondary Hypertension: { Click here to document screening for secondary causes of HTN  :[id number]}    Relevant Labs/Studies:    Latest Ref Rng & Units 01/17/2024   11:32 AM 10/29/2023    2:36 PM 10/18/2023   11:05 AM  Basic Labs  Sodium 134 - 144 mmol/L 140  141  140   Potassium 3.5 - 5.2 mmol/L 5.0  5.1  4.9   Creatinine 0.57 - 1.00 mg/dL 8.46  9.62  9.52        Latest Ref Rng & Units  10/02/2023    8:13 PM 01/21/2023   10:59 AM  Thyroid    TSH 0.350 - 4.500 uIU/mL 1.886  2.11        Latest Ref Rng & Units 10/18/2023   11:05 AM 05/10/2022    2:52 PM  Renin/Aldosterone   Aldosterone 0.0 - 30.0 ng/dL 6.8  3   Aldos/Renin Ratio 0.0 - 30.0 3.7               09/10/2023    2:48 PM  Renovascular   Renal Artery US  Completed Yes        Disposition:    FU with MD/PharmD in {gen number [id number]} {Days to years:10300}    Medication Adjustments/Labs and  Tests Ordered: Current medicines are reviewed at length with the patient today.  Concerns regarding medicines are outlined above.  No orders of the defined types were placed in this encounter.  No orders of the defined types were placed in this encounter.    Signed, [name], MD  02/13/2024 9:12 AM    [facility]

## 2024-02-17 ENCOUNTER — Encounter (HOSPITAL_COMMUNITY): Payer: Self-pay | Admitting: Family

## 2024-02-17 ENCOUNTER — Other Ambulatory Visit: Payer: Self-pay

## 2024-02-17 ENCOUNTER — Ambulatory Visit (HOSPITAL_BASED_OUTPATIENT_CLINIC_OR_DEPARTMENT_OTHER): Admitting: Family

## 2024-02-17 VITALS — BP 145/79 | HR 79 | Ht 62.0 in | Wt 148.0 lb

## 2024-02-17 DIAGNOSIS — F411 Generalized anxiety disorder: Secondary | ICD-10-CM | POA: Diagnosis not present

## 2024-02-17 DIAGNOSIS — F332 Major depressive disorder, recurrent severe without psychotic features: Secondary | ICD-10-CM

## 2024-02-17 NOTE — Progress Notes (Addendum)
 [facility] MD/PA/NP OP Progress Note  02/17/2024 1:31 PM Debbie Hatfield  MRN:  [id number]  Chief Complaint: " Doing okay, little down due to stress fracture"  Debbie Hatfield 68 year old female carries a diagnosis related to major depressive disorder and generalized anxiety disorder.  Seen and evaluated face-to-face by this provider. presents with a right boot on foot due to reports of stress fracture.  States slightly overworked which may have caused an injury to her foot.  States she continues to care for her mother who is 24 years old.  States overall her mood has stabilized with medications.  States she continues to keep herself occupied and work on self-care as was discussed by she and her therapist Debbie Hatfield.  States she is painting by numbers and tries to stay active and engage when she is able.  Reported some restless nights however utilizes Klonopin  for sleep disturbance.  Patient to follow-up with neurologist for medication refills.   No concerns related to suicidal homicidal ideations.  She presents with a flat affect but pleasant.  Denied any concerns related to suicidal thoughts intent or plan.  Does not appear to responding to internal stimuli.  Currently taking Effexor  37.5 mg daily states she has been tolerating dose fairly well.  Reports taking Effexor  at a higher dose keeps her up throughout the night.  No other documented concerns noted at this visit.  Support , encouragement and  reassurance was provided.  HPI:  Visit Diagnosis:    ICD-10-CM   1. Severe episode of recurrent major depressive disorder, without psychotic features (HCC)  F33.2     2. GAD (generalized anxiety disorder)  F41.1       Past Psychiatric History:   Past Medical History:  Past Medical History:  Diagnosis Date   AKI (acute kidney injury) (HCC) 10/03/2023   Closed fracture of right distal radius    Degeneration of lumbar intervertebral disc 01/26/2020   Depression with anxiety 10/03/2023   GAD  (generalized anxiety disorder) 11/15/2021   Gastroparesis    GERD (gastroesophageal reflux disease)    History of CVA (cerebrovascular accident) 10/03/2023   Hyperlipidemia LDL goal <100 03/26/2022   The ASCVD Risk score (Arnett DK, et al., 2019) failed to calculate for the following reasons:    The valid HDL cholesterol range is 20 to 100 mg/dL     Hypertension    Hypotension 10/03/2023   Irritable bowel disease    Lupus    Major depression    Morton's neuroma of left foot 08/30/2023   Near syncope 10/03/2023   Need for shingles vaccine 11/15/2021   Need for vaccination 11/15/2021   Nonrheumatic mitral valve regurgitation 05/10/2022   Osteopenia 12/14/2015   Peptic ulcer disease 12/14/2015   Recurrent major depressive disorder, in partial remission (HCC) 11/15/2021   Sinus bradycardia 10/03/2023   Stage 3a chronic kidney disease (HCC) 11/15/2021   Estimated Creatinine Clearance: 49.7 mL/min (by C-G formula based on SCr of 1.01 mg/dL).      Estimated Creatinine Clearance: 45.9 mL/min (A) (by C-G formula based on SCr of 1.07 mg/dL (H)).      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   Stroke [facility])    Trigeminal neuralgia    Visit for screening mammogram 11/15/2021    Past Surgical History:  Procedure Laterality Date   BUBBLE STUDY  07/05/2022   Procedure: BUBBLE STUDY;  Surgeon: Euell Herrlich, MD;  Location: [facility] ENDOSCOPY;  Service: Cardiology;;   CARPAL TUNNEL RELEASE Left 05/15/2023  CHOLECYSTECTOMY     COLONOSCOPY     LOOP RECORDER IMPLANT  2021   ORIF WRIST FRACTURE Right 09/22/2020   Procedure: OPEN REDUCTION INTERNAL FIXATION (ORIF) WRIST FRACTURE;  Surgeon: Oralia Bills, MD;  Location: [facility];  Service: Orthopedics;  Laterality: Right;    TEE WITHOUT CARDIOVERSION N/A 07/05/2022   Procedure: TRANSESOPHAGEAL ECHOCARDIOGRAM (TEE);  Surgeon: Euell Herrlich, MD;  Location: [facility] ENDOSCOPY;  Service: Cardiology;  Laterality: N/A;    Family  Psychiatric History:   Family History:  Family History  Problem Relation Age of Onset   Heart block Mother        Pacemaker   Hypertension Mother    Depression Mother    Gout Mother    Macular degeneration Mother    Aneurysm Mother    Alcoholism Father    Heart failure Maternal Grandmother    Hypertension Maternal Grandmother    Congestive Heart Failure Maternal Grandmother    Diabetes Maternal Grandmother    Congestive Heart Failure Maternal Grandfather    Colon cancer Maternal Grandfather    Stroke Paternal Grandfather    Hypertension Paternal Grandfather    Esophageal cancer Neg Hx    Stomach cancer Neg Hx    Rectal cancer Neg Hx     Social History:  Social History   Socioeconomic History   Marital status: Widowed    Spouse name: Not on file   Number of children: 0   Years of education: Not on file   Highest education level: Bachelor's degree (e.g., BA, AB, BS)  Occupational History    Comment: RN ortho/neuro at [employer]  Tobacco Use   Smoking status: Never    Passive exposure: Never   Smokeless tobacco: Never  Vaping Use   Vaping status: Never Used  Substance and Sexual Activity   Alcohol use: Yes    Comment: social   Drug use: Never   Sexual activity: Not Currently    Birth control/protection: Post-menopausal  Other Topics Concern   Not on file  Social History Narrative   06/13/22 living with her mother   2 [place] Dew per Week   Social Drivers of Health   Financial Resource Strain: Medium Risk (07/27/2023)   Overall Financial Resource Strain (CARDIA)    Difficulty of Paying Living Expenses: Somewhat hard  Food Insecurity: No Food Insecurity (10/03/2023)   Hunger Vital Sign    Worried About Running Out of Food in the Last Year: Never true    Ran Out of Food in the Last Year: Never true  Transportation Needs: No Transportation Needs (10/03/2023)   PRAPARE - Administrator, Civil Service (Medical): No    Lack of Transportation  (Non-Medical): No  Physical Activity: Sufficiently Active (07/27/2023)   Exercise Vital Sign    Days of Exercise per Week: 4 days    Minutes of Exercise per Session: 40 min  Stress: Stress Concern Present (07/27/2023)   [employer] of Occupational Health - Occupational Stress Questionnaire    Feeling of Stress : Very much  Social Connections: Socially Isolated (07/27/2023)   Social Connection and Isolation Panel [NHANES]    Frequency of Communication with Friends and Family: Three times a week    Frequency of Social Gatherings with Friends and Family: Twice a week    Attends Religious Services: Never    Database administrator or Organizations: No    Attends Banker Meetings: Not on file    Marital  Status: Widowed    Allergies:  Allergies  Allergen Reactions   Plaquenil  [Hydroxychloroquine ]     Retinal damage   Sulfa Antibiotics Nausea And Vomiting   Sulfamethoxazole-Trimethoprim Nausea And Vomiting   Penicillins Rash    Metabolic Disorder Labs: Lab Results  Component Value Date   HGBA1C 5.7 (H) 05/08/2022   MPG 116.89 05/08/2022   No results found for: "PROLACTIN" Lab Results  Component Value Date   CHOL 164 12/25/2022   TRIG 84 12/25/2022   HDL 103 12/25/2022   CHOLHDL 1.6 12/25/2022   VLDL 24 05/08/2022   LDLCALC 46 12/25/2022   LDLCALC 105 (H) 05/08/2022   Lab Results  Component Value Date   TSH 1.886 10/02/2023   TSH 2.11 01/21/2023    Therapeutic Level Labs: No results found for: "LITHIUM" No results found for: "VALPROATE" Lab Results  Component Value Date   CBMZ <2.0 (L) 07/29/2023    Current Medications: Current Outpatient Medications  Medication Sig Dispense Refill   ACETAMINOPHEN  PO Take 1,300 mg by mouth in the morning and at bedtime.     acyclovir  (ZOVIRAX ) 400 MG tablet Take 1 tablet (400 mg total) by mouth daily. 90 tablet 1   amLODipine  (NORVASC ) 2.5 MG tablet Take 1 tablet (2.5 mg total) by mouth at bedtime. CONTINUE 5  MG TABLETS IN MORNING 90 tablet 3   amLODipine  (NORVASC ) 5 MG tablet Take 1 tablet (5 mg total) by mouth daily. 90 tablet 3   clonazePAM  (KLONOPIN ) 0.5 MG tablet Take 2 tablets (1 mg total) by mouth at bedtime as needed for anxiety. 30 tablet 0   clopidogrel  (PLAVIX ) 75 MG tablet One po qd 90 tablet 4   doxepin  (SINEQUAN ) 50 MG capsule Take 1 capsule (50 mg total) by mouth at bedtime. 30 capsule 2   gabapentin  (NEURONTIN ) 300 MG capsule Take 600 mg by mouth 2 (two) times daily.     pantoprazole  (PROTONIX ) 40 MG tablet Take 1 tablet (40 mg total) by mouth 2 (two) times daily. 90 tablet 3   spironolactone  (ALDACTONE ) 25 MG tablet Take 1 tablet (25 mg total) by mouth daily. 90 tablet 3   telmisartan  (MICARDIS ) 40 MG tablet Take 1 tablet (40 mg total) by mouth daily. 30 tablet 3   tizanidine  (ZANAFLEX ) 2 MG capsule Take 4 mg by mouth at bedtime.     traMADol  (ULTRAM ) 50 MG tablet Take 50 mg by mouth.     venlafaxine  (EFFEXOR ) 37.5 MG tablet Take 1 tablet (37.5 mg total) by mouth 2 (two) times daily. 180 tablet 1   rosuvastatin  (CRESTOR ) 20 MG tablet Take 1 tablet (20 mg total) by mouth daily. 90 tablet 1   No current facility-administered medications for this visit.     Musculoskeletal: Strength & Muscle Tone: within normal limits Gait & Station: unsteady-noted to have a boot on right leg due to reported stress fracture Patient leans: N/A  Psychiatric Specialty Exam: Review of Systems  Psychiatric/Behavioral:  Positive for sleep disturbance. Negative for agitation, decreased concentration and hallucinations. The patient is nervous/anxious.   All other systems reviewed and are negative.   Blood pressure (!) 145/79, pulse 79, height 5\' 2"  (1.575 m), weight 148 lb (67.1 kg).Body mass index is 27.07 kg/m.  General Appearance: Casual  Eye Contact:  Good  Speech:  Clear and Coherent  Volume:  Normal  Mood:  Anxious and Depressed  Affect:  Congruent  Thought Process:  Coherent  Orientation:   Full (Time, Place, and Person)  Thought Content:  Logical   Suicidal Thoughts:  No  Homicidal Thoughts:  No  Memory:  Immediate;   Good Recent;   Good  Judgement:  Good  Insight:  Good  Psychomotor Activity:  Normal  Concentration:  Concentration: Good  Recall:  Good  Fund of Knowledge: Good  Language: Good  Akathisia:  No  Handed:  Right  AIMS (if indicated): not done  Assets:  Communication Skills Desire for Improvement Resilience Social Support  ADL's:  Intact  Cognition: WNL  Sleep:  Fair   Screenings: GAD-7    Advertising copywriter from 11/26/2023 in [facility] Visit from 11/15/2021 in [facility]  Total GAD-7 Score 17 2      PHQ2-9    Flowsheet Row Counselor from 11/26/2023 in [facility] at [facility] Visit from 10/29/2023 in [facility] [place] [facility] Visit from 08/28/2023 in [facility] Clinical Support from 10/24/2022 in [facility] [place] [facility] Visit from 10/22/2022 in [facility] [place]  PHQ-2 Total Score 5 0 5 0 0  PHQ-9 Total Score 19 -- 12 8 8       Flowsheet Row Counselor from 11/26/2023 in [facility] at [facility] ED to Hosp-Admission (Discharged) from 10/02/2023 in [facility] ED to Hosp-Admission (Discharged) from 05/07/2022 in [facility] NEURO/TRAUMA/SURGICAL ICU  C-SSRS RISK CATEGORY No Risk No Risk No Risk        Assessment and Plan: Debbie Hatfield 68 year old female presents for medication management follow-up appointment.  Overall, she reports her mood is stabilized.  She continues to take Effexor  37.5 mg daily.  States she utilizes Klonopin  0.5 to 1 mg nightly for sleep disturbance that is provided by her neurologist.  States she continues to work on self-care with  her therapist no other documented concerns at this visit.  She declined medication refills at this time.  Will continue to follow.  Patient to schedule follow-up appointment 4 to 5 months for medication management.  Support, encouragement and reassurance was provided.  Collaboration of Care: Collaboration of Care: Other .  Outpatient follow-up appointment with therapist Debbie Hatfield and continue medications as indicated.  Patient/Guardian was advised Release of Information must be obtained prior to any record release in order to collaborate their care with an outside provider. Patient/Guardian was advised if they have not already done so to contact the registration department to sign all necessary forms in order for us  to release information regarding their care.   Consent: Patient/Guardian gives verbal consent for treatment and assignment of benefits for services provided during this visit. Patient/Guardian expressed understanding and agreed to proceed.    Levester Reagin, NP 02/17/2024, 1:31 PM

## 2024-02-21 ENCOUNTER — Encounter (HOSPITAL_BASED_OUTPATIENT_CLINIC_OR_DEPARTMENT_OTHER): Payer: Self-pay | Admitting: Cardiovascular Disease

## 2024-02-22 ENCOUNTER — Encounter (HOSPITAL_BASED_OUTPATIENT_CLINIC_OR_DEPARTMENT_OTHER): Payer: Self-pay | Admitting: Cardiovascular Disease

## 2024-02-23 LAB — LIPID PANEL
Chol/HDL Ratio: 2.3 ratio (ref 0.0–4.4)
Cholesterol, Total: 193 mg/dL (ref 100–199)
HDL: 84 mg/dL (ref >39)
LDL Chol Calc (NIH): 97 mg/dL (ref 0–99)
Triglycerides: 64 mg/dL (ref 0–149)
VLDL Cholesterol Cal: 12 mg/dL (ref 5–40)

## 2024-02-23 LAB — COMPREHENSIVE METABOLIC PANEL WITH GFR
ALT: 15 IU/L (ref 0–32)
AST: 23 IU/L (ref 0–40)
Albumin: 4.6 g/dL (ref 3.9–4.9)
Alkaline Phosphatase: 115 IU/L (ref 44–121)
BUN/Creatinine Ratio: 21 (ref 12–28)
BUN: 28 mg/dL — ABNORMAL HIGH (ref 8–27)
Bilirubin Total: 0.6 mg/dL (ref 0.0–1.2)
CO2: 19 mmol/L — ABNORMAL LOW (ref 20–29)
Calcium: 9.7 mg/dL (ref 8.7–10.3)
Chloride: 104 mmol/L (ref 96–106)
Creatinine, Ser: 1.31 mg/dL — ABNORMAL HIGH (ref 0.57–1.00)
Globulin, Total: 2.5 g/dL (ref 1.5–4.5)
Glucose: 89 mg/dL (ref 70–99)
Potassium: 5.3 mmol/L — ABNORMAL HIGH (ref 3.5–5.2)
Sodium: 140 mmol/L (ref 134–144)
Total Protein: 7.1 g/dL (ref 6.0–8.5)
eGFR: 45 mL/min/{1.73_m2} — ABNORMAL LOW (ref >59)

## 2024-02-23 LAB — CATECHOLAMINES, FRACTIONATED, PLASMA
Dopamine: 86.5 pg/mL — ABNORMAL HIGH (ref 0.0–36.7)
Epinephrine: <10 pg/mL (ref 0.0–55.4)
Norepinephrine: 1117 pg/mL — ABNORMAL HIGH (ref 115–524)

## 2024-02-23 LAB — METANEPHRINES, PLASMA
Metanephrine, Free: <25 pg/mL (ref 0.0–88.0)
Normetanephrine, Free: 207 pg/mL (ref 0.0–285.2)

## 2024-02-24 ENCOUNTER — Encounter: Payer: Self-pay | Admitting: Internal Medicine

## 2024-02-24 NOTE — Telephone Encounter (Signed)
 Symptom x2 activation's occurred 5/10 @ 15:55 and 22:04, EGM's c/w SB and SB with PAC's.  No BB on file.   Routing to Dr. Lawana Pray to see patients mychart message/advise further. Please see highlighted park for symptoms.

## 2024-02-25 ENCOUNTER — Ambulatory Visit (HOSPITAL_BASED_OUTPATIENT_CLINIC_OR_DEPARTMENT_OTHER): Payer: Self-pay | Admitting: Family

## 2024-02-25 DIAGNOSIS — I10 Essential (primary) hypertension: Secondary | ICD-10-CM

## 2024-02-25 NOTE — Telephone Encounter (Signed)
 Pt concerned her labs are emergent,not wanting to wait until [place] back in office for review

## 2024-02-26 ENCOUNTER — Ambulatory Visit (HOSPITAL_COMMUNITY): Admitting: Licensed Clinical Social Worker

## 2024-02-26 ENCOUNTER — Telehealth: Payer: Self-pay | Admitting: *Deleted

## 2024-02-26 NOTE — Telephone Encounter (Signed)
 Spoke with patient regarding alert transmission  Patient stated she never lost consciousness during event  Patient confirmed she is compliant with all her prescribed medications Patient stated she is feeling okay today with no symptoms noted Patient instructed to call clinic if experiencing any more symptoms or go to the ED if feeling really bad Patient acknowledged understanding of instructions provided All questions answered and patient appreciative of call

## 2024-02-26 NOTE — Addendum Note (Signed)
 Addended by: Zorita Hiss on: 02/26/2024 12:30 PM   Modules accepted: Orders

## 2024-02-26 NOTE — Telephone Encounter (Addendum)
 Alert received from [employer]:  Alert remote transmission:  2 new symptom activation's 5/13 @ 17:03, EGM c/w NSR, PVC's 3sec of NCT followed by junctional beat, NSR with PAC's - route to triage 5/13 @ 09:56, EGM c/w NSR, oversensing of noise/artifact, pt reports no activity   Outreach made to patient. No answer. LMTCB. Will route to MD for awareness

## 2024-02-26 NOTE — Telephone Encounter (Signed)
 Pt returned nurse call. I let her know that the nurse will give them a call back. [phone] is a good number for her.

## 2024-02-27 ENCOUNTER — Other Ambulatory Visit (HOSPITAL_BASED_OUTPATIENT_CLINIC_OR_DEPARTMENT_OTHER): Payer: Self-pay | Admitting: Family

## 2024-02-27 ENCOUNTER — Other Ambulatory Visit: Payer: Self-pay | Admitting: Gastroenterology

## 2024-02-27 MED ORDER — PRAVASTATIN SODIUM 20 MG PO TABS: 20.0000 mg | ORAL_TABLET | ORAL | 0 refills | Status: DC

## 2024-02-27 NOTE — Telephone Encounter (Signed)
 Recommend stop Crestor . Start Pravastatin 20mg  three times per week. Repeat labs (FLP/LFT) in 2 months. If she has recurrent cramping with Pravastatin, let the office know.   Debbie Posa S Audi Wettstein, NP

## 2024-02-27 NOTE — Addendum Note (Signed)
 Addended by: Zorita Hiss on: 02/27/2024 09:53 AM   Modules accepted: Orders

## 2024-02-28 LAB — LAB REPORT - SCANNED: EGFR: 45

## 2024-03-01 ENCOUNTER — Encounter: Payer: Self-pay | Admitting: Gastroenterology

## 2024-03-02 ENCOUNTER — Other Ambulatory Visit (HOSPITAL_COMMUNITY): Payer: Self-pay | Admitting: Internal Medicine

## 2024-03-02 NOTE — Telephone Encounter (Signed)
 I am not sure what heard that there was issues in regards to the PPI dosing. Would go ahead and try to send in a prescription for twice daily dosing of PPI (60/12). I would do this since she was doing better with her GERD symptoms at that point. I am okay with the patient being set up for a direct endoscopy for repeat esophageal dilation. If she wants to be seen in clinic before scheduling that is okay to and she can be scheduled with me or one of the APP's if needed. Otherwise, again she can be scheduled for direct endoscopy in the [facility]. Thanks. GM

## 2024-03-03 ENCOUNTER — Other Ambulatory Visit: Payer: Self-pay

## 2024-03-03 ENCOUNTER — Ambulatory Visit (INDEPENDENT_AMBULATORY_CARE_PROVIDER_SITE_OTHER): Admitting: Licensed Clinical Social Worker

## 2024-03-03 DIAGNOSIS — F4310 Post-traumatic stress disorder, unspecified: Secondary | ICD-10-CM

## 2024-03-03 DIAGNOSIS — F332 Major depressive disorder, recurrent severe without psychotic features: Secondary | ICD-10-CM

## 2024-03-03 DIAGNOSIS — F411 Generalized anxiety disorder: Secondary | ICD-10-CM

## 2024-03-03 MED ORDER — PANTOPRAZOLE SODIUM 40 MG PO TBEC: 40.0000 mg | DELAYED_RELEASE_TABLET | Freq: Two times a day (BID) | ORAL | 12 refills | Status: DC

## 2024-03-03 NOTE — Progress Notes (Signed)
 Virtual Visit via Video Note   I connected with Debbie Hatfield on 03/03/24 at 2:00pm by video enabled telemedicine application and verified that I am speaking with the correct person using two identifiers.   I discussed the limitations, risks, security and privacy concerns of performing an evaluation and management service by video and the availability of in person appointments. I also discussed with the patient that there may be a patient responsible charge related to this service. The patient expressed understanding and agreed to proceed.   I discussed the assessment and treatment plan with the patient. The patient was provided an opportunity to ask questions and all were answered. The patient agreed with the plan and demonstrated an understanding of the instructions.   The patient was advised to call back or seek an in-person evaluation if the symptoms worsen or if the condition fails to improve as anticipated.   I provided 1 hour of non-face-to-face time during this encounter.     Desmond Florida, LCSW, LCAS ________________________________  THERAPIST PROGRESS NOTE   Session Time: 2:00pm - 3:00pm     Location: Patient: Patient Home    Provider: Home Office      Participation Level: Active    Behavioral Response: Alert, casually dressed, anxious mood/affect      Type of Therapy:  Individual Therapy   Treatment Goals addressed: Depression/anxiety management; Medication compliance    Progress Towards Goals: Progressing      Interventions: CBT: challenging anxious thoughts       Summary: Debbie Hatfield is a 68 year old widowed Caucasian female that presented today with diagnoses of Major Depressive Disorder, recurrent, severe; Generalized Anxiety Disorder; and PTSD.   Suicidal/Homicidal: None; without intent or plan.     Therapist Response:  Clinician met with Debbie Hatfield today for virtual therapy appointment and assessed for safety, medication compliance, and sobriety.  Debbie Hatfield  presented for today's session on time and was alert, oriented x5, with no evidence or self-report of active SI/HI or A/V hallucinations.  Debbie Hatfield denied any use of alcohol or illicit substances.  She reported compliance with medication.  Clinician inquired about Debbie Hatfield's emotional ratings today, as well as any significant changes in thoughts, feelings, or behavior since previous check-in.  Debbie Hatfield reported scores of 2/10 for depression, 4/10 for anxiety, and 0/10 for anger/irritability.  Debbie Hatfield denied experiencing any recent panic attacks or outbursts.  Debbie Hatfield reported that a recent struggle has been having several health issues, such as irregular heartbeat, high norepinephrine levels, and breaking her foot.  She reported that due to all of these cumulative problems, she worries she "May not have much time left".  Clinician utilized handout in session today titled "Worry exploration" in order to assist Debbie Hatfield in reducing her anxiety related to recent health challenges.  This worksheet featured a series of Socratic questions aimed at exploring the most likely outcomes for a situation of concern, rather than focusing on the worst possible outcome (i.e. catastrophizing).  Clinician assisted Debbie Hatfield in identifying and challenging any irrational beliefs related to this worry, in addition to utilizing problem solving approach to explore strategies which would help her improve overall health and wellbeing.  Debbie Hatfield actively participated in discussion on handout, reporting that she is primarily worried that the high norepinephrine levels suggest that she has a tumor on her kidneys.  Debbie Hatfield reported that there is sufficient evidence to suggest she will be okay though due to talking about this in session, noting that several things can influence these levels, including  physiological trauma like her foot injury, anxiety, depression, or medication changes.  Intervention was effective, as evidenced by Debbie Hatfield reporting that  discussion on this subject reduced her depression and anxiety levels down to 1/10 and 2/10 each.  She reported that it also helped her achieve a more realistic perspective regarding current health challenges, and gave her hope for the future, stating "I can see other angles to this now.  Maybe its not as bad as I'm thinking".  Debbie Hatfield reported that she would still like to speak with provider handling behavioral medication, and provided verbal consent for clinician to email her about norepinephrine concerns. Clinician emailed Dan Dun, NP about these concerns at end of session after Debbie Hatfield approved content of message.  Clinician will continue to monitor.             Plan: Follow up in 1 week.   Diagnosis: Major Depressive Disorder, recurrent, severe; Generalized Anxiety Disorder; and PTSD.  Collaboration of Care:   Dan Dun, NP regarding medication concerns.                                                      Patient/Guardian was advised Release of Information must be obtained prior to any record release in order to collaborate their care with an outside provider. Patient/Guardian was advised if they have not already done so to contact the registration department to sign all necessary forms in order for us  to release information regarding their care.    Consent: Patient/Guardian gives verbal consent for treatment and assignment of benefits for services provided during this visit. Patient/Guardian expressed understanding and agreed to proceed.   Desmond Florida, LCSW, LCAS 03/03/24

## 2024-03-04 ENCOUNTER — Encounter: Payer: Self-pay | Admitting: Internal Medicine

## 2024-03-04 NOTE — Telephone Encounter (Cosign Needed)
 This provider attempted to follow-up with patient who had concerns related to elevated norepinephrine and dopamine levels.  Goal of as patient to discontinue Effexor  and or taper off every 2 to 3 days.  Follow-up in office for additional medication recommendation.

## 2024-03-06 ENCOUNTER — Ambulatory Visit
Admission: RE | Admit: 2024-03-06 | Discharge: 2024-03-06 | Disposition: A | Discharge status: Home / Self Care | Payer: Medicare Other | Source: Ambulatory Visit | Attending: Internal Medicine | Admitting: Internal Medicine

## 2024-03-06 DIAGNOSIS — E2839 Other primary ovarian failure: Secondary | ICD-10-CM

## 2024-03-07 LAB — METANEPHRINES, URINE, 24 HOUR
Metaneph Total, Ur: 27 ug/L
Metanephrines, 24H Ur: 41 ug/(24.h) (ref 36–209)
Normetanephrine, 24H Ur: 530 ug/(24.h) (ref 131–612)
Normetanephrine, Ur: 353 ug/L

## 2024-03-07 LAB — CATECHOLAMINES, FRACTIONATED, URINE, 24 HOUR
Dopamine , 24H Ur: 111 ug/(24.h) (ref 0–510)
Dopamine, Rand Ur: 74 ug/L
Epinephrine, 24H Ur: <5 ug/(24.h) (ref 0–20)
Epinephrine, Rand Ur: <3 ug/L
Norepinephrine, 24H Ur: 32 ug/(24.h) (ref 0–135)
Norepinephrine, Rand Ur: 21 ug/L

## 2024-03-10 ENCOUNTER — Ambulatory Visit: Payer: Self-pay | Admitting: Internal Medicine

## 2024-03-10 NOTE — Telephone Encounter (Signed)
**Note De-identified  [place] Obfuscation** Please advise 

## 2024-03-11 ENCOUNTER — Other Ambulatory Visit: Payer: Self-pay | Admitting: Neurology

## 2024-03-11 ENCOUNTER — Telehealth: Payer: Self-pay | Admitting: Neurology

## 2024-03-11 ENCOUNTER — Other Ambulatory Visit (INDEPENDENT_AMBULATORY_CARE_PROVIDER_SITE_OTHER): Payer: Self-pay | Admitting: Family

## 2024-03-11 ENCOUNTER — Other Ambulatory Visit: Payer: Self-pay | Admitting: *Deleted

## 2024-03-11 ENCOUNTER — Encounter: Payer: Self-pay | Admitting: Family Medicine

## 2024-03-11 DIAGNOSIS — R451 Restlessness and agitation: Secondary | ICD-10-CM

## 2024-03-11 DIAGNOSIS — F332 Major depressive disorder, recurrent severe without psychotic features: Secondary | ICD-10-CM

## 2024-03-11 DIAGNOSIS — G479 Sleep disorder, unspecified: Secondary | ICD-10-CM | POA: Diagnosis not present

## 2024-03-11 MED ORDER — AMITRIPTYLINE HCL 25 MG PO TABS: ORAL_TABLET | ORAL | 0 refills | Status: DC

## 2024-03-11 MED ORDER — CLONAZEPAM 0.5 MG PO TABS: 1.0000 mg | ORAL_TABLET | Freq: Every evening | ORAL | 5 refills | Status: DC | PRN

## 2024-03-11 NOTE — Progress Notes (Signed)
 Virtual Visit via Telephone Note  I connected with Debbie Hatfield on 03/11/24 at  by telephone and verified that I am speaking with the correct person using two identifiers.  Location: Patient: Home Provider: Office   I discussed the limitations, risks, security and privacy concerns of performing an evaluation and management service by telephone and the availability of in person appointments. I also discussed with the patient that there may be a patient responsible charge related to this service. The patient expressed understanding and agreed to proceed.   I discussed the assessment and treatment plan with the patient. The patient was provided an opportunity to ask questions and all were answered. The patient agreed with the plan and demonstrated an understanding of the instructions.   The patient was advised to call back or seek an in-person evaluation if the symptoms worsen or if the condition fails to improve as anticipated.  I provided 10 minutes of non-face-to-face time during this encounter.   Levester Reagin, NP   [facility] MD/PA/NP OP Progress Note  03/11/2024 2:36 PM Debbie Hatfield  MRN:  [id number]  Chief Complaint: Medication management  HPI: Debbie Hatfield 68 year old female presents with concerns due to elevated catecholamine levels, patient reported elevated  norepinephrine and dopamine 3 weeks prior.  Discussed discontinuing Effexor  and doxepin .  Discussed initiating amitriptyline 25 to 50 mg for mood stabilization.  Case staffed with attending psychiatrist Arfeen for medication recommendations/adjustments.  Patient was receptive to plan.  Elanie reports her primary care provider has recently refilled her Klonopin  due to increased anxiety, chest palpitation, restlessness and racing thoughts.  Reports ongoing testing related to urinary analysis and additional medical workup.  Patient to follow-up after PCP visit June 18.  Repeat labs.   Chart reviewed additional medication  adjustments recommendations noted due to elevated catecholamines.    Visit Diagnosis:    ICD-10-CM   1. Severe episode of recurrent major depressive disorder, without psychotic features (HCC)  F33.2     2. Restless  R45.1     3. Sleep disturbance  G47.9       Past Psychiatric History:   Past Medical History:  Past Medical History:  Diagnosis Date   AKI (acute kidney injury) (HCC) 10/03/2023   Closed fracture of right distal radius    Degeneration of lumbar intervertebral disc 01/26/2020   Depression with anxiety 10/03/2023   GAD (generalized anxiety disorder) 11/15/2021   Gastroparesis    GERD (gastroesophageal reflux disease)    History of CVA (cerebrovascular accident) 10/03/2023   Hyperlipidemia LDL goal <100 03/26/2022   The ASCVD Risk score (Arnett DK, et al., 2019) failed to calculate for the following reasons:    The valid HDL cholesterol range is 20 to 100 mg/dL     Hypertension    Hypotension 10/03/2023   Irritable bowel disease    Lupus    Major depression    Morton's neuroma of left foot 08/30/2023   Near syncope 10/03/2023   Need for shingles vaccine 11/15/2021   Need for vaccination 11/15/2021   Nonrheumatic mitral valve regurgitation 05/10/2022   Osteopenia 12/14/2015   Peptic ulcer disease 12/14/2015   Recurrent major depressive disorder, in partial remission (HCC) 11/15/2021   Sinus bradycardia 10/03/2023   Stage 3a chronic kidney disease (HCC) 11/15/2021   Estimated Creatinine Clearance: 49.7 mL/min (by C-G formula based on SCr of 1.01 mg/dL).      Estimated Creatinine Clearance: 45.9 mL/min (A) (by C-G formula based on SCr of 1.07 mg/dL (H)).  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   Stroke [facility])    Trigeminal neuralgia    Visit for screening mammogram 11/15/2021    Past Surgical History:  Procedure Laterality Date   BUBBLE STUDY  07/05/2022   Procedure: BUBBLE STUDY;  Surgeon: Euell Herrlich, MD;  Location: [facility] ENDOSCOPY;  Service:  Cardiology;;   CARPAL TUNNEL RELEASE Left 05/15/2023   CHOLECYSTECTOMY     COLONOSCOPY     LOOP RECORDER IMPLANT  2021   ORIF WRIST FRACTURE Right 09/22/2020   Procedure: OPEN REDUCTION INTERNAL FIXATION (ORIF) WRIST FRACTURE;  Surgeon: Oralia Bills, MD;  Location: [facility];  Service: Orthopedics;  Laterality: Right;    TEE WITHOUT CARDIOVERSION N/A 07/05/2022   Procedure: TRANSESOPHAGEAL ECHOCARDIOGRAM (TEE);  Surgeon: Euell Herrlich, MD;  Location[facility] END[facility]COPY;  Service: Cardiology;  Laterality: N/A;    Family Psychiatric History:  Family History:  Family History  Problem Relation Age of Onset   Heart block Mother        Pacemaker   Hypertension Mother    Depression Mother    Gout Mother    Macular degeneration Mother    Aneurysm Mother    Alcoholism Father    Heart failure Maternal Grandmother    Hypertension Maternal Grandmother    Congestive Heart Failure Maternal Grandmother    Diabetes Maternal Grandmother    Congestive Heart Failure Maternal Grandfather    Colon cancer Maternal Grandfather    Stroke Paternal Grandfather    Hypertension Paternal Grandfather    Esophageal cancer Neg Hx    Stomach cancer Neg Hx    Rectal cancer Neg Hx     Social History:  Social History   Socioeconomic History   Marital status: Widowed    Spouse name: Not on file   Number of children: 0   Years of education: Not on file   Highest education level: Bachelor's degree (e.g., BA, AB, BS)  Occupational History    Comment: RN ortho/neuro at [employer]  Tobacco Use   Smoking status: Never    Passive exposure: Never   Smokeless tobacco: Never  Vaping Use   Vaping status: Never Used  Substance and Sexual Activity   Alcohol use: Yes    Comment: social   Drug use: Never   Sexual activity: Not Currently    Birth control/protection: Post-menopausal  Other Topics Concern   Not on file  Social History Narrative   06/13/22 living with her mother   2  [place] Dew per Week   Social Drivers of Health   Financial Resource Strain: Medium Risk (07/27/2023)   Overall Financial Resource Strain (CARDIA)    Difficulty of Paying Living Expenses: Somewhat hard  Food Insecurity: No Food Insecurity (10/03/2023)   Hunger Vital Sign    Worried About Running Out of Food in the Last Year: Never true    Ran Out of Food in the Last Year: Never true  Transportation Needs: No Transportation Needs (10/03/2023)   PRAPARE - Administrator, Civil Service (Medical): No    Lack of Transportation (Non-Medical): No  Physical Activity: Sufficiently Active (07/27/2023)   Exercise Vital Sign    Days of Exercise per Week: 4 days    Minutes of Exercise per Session: 40 min  Stress: Stress Concern Present (07/27/2023)   [employer] of Occupational Health - Occupational Stress Questionnaire    Feeling of Stress : Very much  Social Connections: Socially Isolated (07/27/2023)   Social Connection and  Isolation Panel [NHANES]    Frequency of Communication with Friends and Family: Three times a week    Frequency of Social Gatherings with Friends and Family: Twice a week    Attends Religious Services: Never    Database administrator or Organizations: No    Attends Engineer, structural: Not on file    Marital Status: Widowed    Allergies:  Allergies  Allergen Reactions   Plaquenil  [Hydroxychloroquine ]     Retinal damage   Sulfa Antibiotics Nausea And Vomiting   Sulfamethoxazole-Trimethoprim Nausea And Vomiting   Penicillins Rash    Metabolic Disorder Labs: Lab Results  Component Value Date   HGBA1C 5.7 (H) 05/08/2022   MPG 116.89 05/08/2022   No results found for: "PROLACTIN" Lab Results  Component Value Date   CHOL 193 02/18/2024   TRIG 64 02/18/2024   HDL 84 02/18/2024   CHOLHDL 2.3 02/18/2024   VLDL 24 05/08/2022   LDLCALC 97 02/18/2024   LDLCALC 46 12/25/2022   Lab Results  Component Value Date   TSH 1.886  10/02/2023   TSH 2.11 01/21/2023    Therapeutic Level Labs: No results found for: "LITHIUM" No results found for: "VALPROATE" Lab Results  Component Value Date   CBMZ <2.0 (L) 07/29/2023    Current Medications: Current Outpatient Medications  Medication Sig Dispense Refill   amitriptyline (ELAVIL) 25 MG tablet Take 1 tablet (25 mg total) by mouth at bedtime for 7 days, THEN 2 tablets (50 mg total) at bedtime. 67 tablet 0   ACETAMINOPHEN  PO Take 1,300 mg by mouth in the morning and at bedtime.     acyclovir  (ZOVIRAX ) 400 MG tablet Take 1 tablet (400 mg total) by mouth daily. 90 tablet 1   amLODipine  (NORVASC ) 2.5 MG tablet Take 1 tablet (2.5 mg total) by mouth at bedtime. CONTINUE 5 MG TABLETS IN MORNING 90 tablet 3   amLODipine  (NORVASC ) 5 MG tablet Take 1 tablet (5 mg total) by mouth daily. 90 tablet 3   clonazePAM  (KLONOPIN ) 0.5 MG tablet Take 2 tablets (1 mg total) by mouth at bedtime as needed for anxiety. 30 tablet 5   clopidogrel  (PLAVIX ) 75 MG tablet One po qd 90 tablet 4   gabapentin  (NEURONTIN ) 300 MG capsule Take 600 mg by mouth 2 (two) times daily.     pantoprazole  (PROTONIX ) 40 MG tablet Take 1 tablet (40 mg total) by mouth 2 (two) times daily before a meal. 60 tablet 12   pravastatin  (PRAVACHOL ) 20 MG tablet Take 1 tablet (20 mg total) by mouth 3 (three) times a week. 36 tablet 0   rosuvastatin  (CRESTOR ) 20 MG tablet Take 1 tablet (20 mg total) by mouth daily. 90 tablet 1   spironolactone  (ALDACTONE ) 25 MG tablet Take 1 tablet (25 mg total) by mouth daily. 90 tablet 3   telmisartan  (MICARDIS ) 40 MG tablet Take 1 tablet (40 mg total) by mouth daily. 30 tablet 3   tizanidine  (ZANAFLEX ) 2 MG capsule Take 4 mg by mouth at bedtime.     traMADol  (ULTRAM ) 50 MG tablet Take 50 mg by mouth.     No current facility-administered medications for this visit.     Musculoskeletal: Telephonic assessment  Psychiatric Specialty Exam: Review of Systems  There were no vitals taken  for this visit.There is no height or weight on file to calculate BMI.  General Appearance: NA  Eye Contact:  NA  Speech:  Clear and Coherent  Volume:  Normal  Mood:  Anxious  Affect:  Congruent  Thought Process:  Coherent  Orientation:  Full (Time, Place, and Person)  Thought Content: Logical   Suicidal Thoughts:  No  Homicidal Thoughts:  No  Memory:  Immediate;   Good Recent;   Good  Judgement:  Good  Insight:  Good  Psychomotor Activity:  EPS  Concentration:  Concentration: Good  Recall:  Good  Fund of Knowledge: Good  Language: Good  Akathisia:  No  Handed:  Right  AIMS (if indicated): not done  Assets:  Communication Skills Desire for Improvement Resilience Social Support  ADL's:  Intact  Cognition: WNL  Sleep:  Poor   Screenings: GAD-7    Advertising copywriter from 11/26/2023 in [facility] Visit from 11/15/2021 in [facility]  Total GAD-7 Score 17 2      PHQ2-9    Flowsheet Row Counselor from 11/26/2023 in [facility] Visit from 10/29/2023 in [facility] [place] [facility] Visit from 08/28/2023 in [facility] Clinical Support from 10/24/2022 in [facility] [place] [facility] Visit from 10/22/2022 in [facility] [place]  PHQ-2 Total Score 5 0 5 0 0  PHQ-9 Total Score 19 -- 12 8 8       Flowsheet Row Counselor from 11/26/2023 in [facility] ED to Hosp-Admission (Discharged) from 10/02/2023 in [facility] to Hosp-Admission (Discharged) from 05/07/2022 in [facility] NEURO/TRAUMA/SURGICAL ICU  C-SSRS RISK CATEGORY No Risk No Risk No Risk        Assessment and Plan:  Patient to start amitriptyline 25 mg daily x 7 days titrate to 50 mg  Discontinue Effexor  37.5 mg  and Doxepin  Follow-up 1 month   Collaboration of Care: Collaboration of Care: Medication Management AEB start amitriptyline  Patient/Guardian was advised Release of Information must be obtained prior to any record release in order to collaborate their care with an outside provider. Patient/Guardian was advised if they have not already done so to contact the registration department to sign all necessary forms in order for us  to release information regarding their care.   Consent: Patient/Guardian gives verbal consent for treatment and assignment of benefits for services provided during this visit. Patient/Guardian expressed understanding and agreed to proceed.    Levester Reagin, NP 03/11/2024, 2:36 PM

## 2024-03-11 NOTE — Telephone Encounter (Addendum)
 Last seen on 08/07/23 Follow up scheduled 08/13/24  CLONAZEPAM  0.5 MG TABLET 11/25/2023 15 30 tablet Sater, Sherida Dimmer, MD [facility] DR[facility] STORE #...       I don't see any mention in chart?  Rx pending to be signed

## 2024-03-11 NOTE — Telephone Encounter (Signed)
 Replied to pt mychart she sent.

## 2024-03-11 NOTE — Telephone Encounter (Signed)
 Dr. Godwin Lat- there is no mention in last note if you are writing/refilling. Ok to refill? Pt last seen 08/07/23 and next f/u 08/13/24. Last refilled 11/25/23 #30.

## 2024-03-11 NOTE — Telephone Encounter (Signed)
 Pt states she has some test results that will show Dr Godwin Lat something's, she'd like to know if Dr Godwin Lat will allow her to be seen earlier, please call.

## 2024-03-11 NOTE — Telephone Encounter (Signed)
 Often if plasma levels are elevated, we do the confirmatory testing with the urine tests. As urine tests normal, the plasma levels are not of concern.   Debbie Heid S Calton Harshfield, NP

## 2024-03-12 ENCOUNTER — Telehealth: Payer: Self-pay

## 2024-03-12 ENCOUNTER — Other Ambulatory Visit: Payer: Self-pay

## 2024-03-12 ENCOUNTER — Ambulatory Visit: Admitting: Gastroenterology

## 2024-03-12 DIAGNOSIS — L989 Disorder of the skin and subcutaneous tissue, unspecified: Secondary | ICD-10-CM

## 2024-03-12 DIAGNOSIS — Z129 Encounter for screening for malignant neoplasm, site unspecified: Secondary | ICD-10-CM

## 2024-03-12 NOTE — Telephone Encounter (Signed)
-----   Message from Jorie Newness sent at 03/12/2024  2:23 PM EDT ----- She may hold the plavix  5 days before the procedure and restart the day after the procedure ----- Message ----- From: Aneita Keens, RN Sent: 03/12/2024   1:59 PM EDT To: Jorie Newness, MD

## 2024-03-12 NOTE — Telephone Encounter (Signed)
 Patty,  I see you scheduled this patient for upper endo. She is on Plavix . Please obtain blood thinner hold unless the pt need to have an OV prior to proceeding.   Thank you,   Previsit

## 2024-03-12 NOTE — Telephone Encounter (Signed)
 She does not need an office visit. I have sent a letter to Dr Godwin Lat to get the hold.

## 2024-03-13 ENCOUNTER — Ambulatory Visit (INDEPENDENT_AMBULATORY_CARE_PROVIDER_SITE_OTHER): Payer: Medicare Other

## 2024-03-13 ENCOUNTER — Ambulatory Visit: Payer: Self-pay | Admitting: Cardiology

## 2024-03-13 DIAGNOSIS — R001 Bradycardia, unspecified: Secondary | ICD-10-CM

## 2024-03-13 LAB — CUP PACEART REMOTE DEVICE CHECK
Date Time Interrogation Session: 20250529232108
Implantable Pulse Generator Implant Date: 20220728

## 2024-03-16 ENCOUNTER — Other Ambulatory Visit: Payer: Self-pay | Admitting: Internal Medicine

## 2024-03-16 DIAGNOSIS — M810 Age-related osteoporosis without current pathological fracture: Secondary | ICD-10-CM | POA: Insufficient documentation

## 2024-03-16 MED ORDER — DENOSUMAB 60 MG/ML ~~LOC~~ SOSY
60.0000 mg | PREFILLED_SYRINGE | Freq: Once | SUBCUTANEOUS | Status: AC
  Administered 2024-04-02: 60 mg via SUBCUTANEOUS

## 2024-03-16 NOTE — Progress Notes (Signed)
  Electrophysiology Office Note:   Date:  03/17/2024  ID:  Debbie Hatfield, DOB May 20, 1956, MRN [id number]  Primary Cardiologist: Zoe Hinds, MD Primary Heart Failure: None Electrophysiologist: None      History of Present Illness:   Debbie Hatfield is a 68 y.o. female with h/o HTN, HLD, SB, MV regurgitation, near syncope / syncope, lupus, IBS, CKD 3a, CVA seen today for routine electrophysiology followup.   MDT ILR in place > pt activated symptom trigger for dizziness / lightheaded / faint feeling. She was noted to be in SB with PAC's at the time. She had additional alerts on 5/13 and was in NSR with PAC's, occ PVC, additional artifact.   Since last being seen in our clinic in 2022 the patient reports having episodes of lightheadedness, dizziness, and the sensation of her heart pounding. Her ILR does not show any corresponding rhythm disturbances. She notes her blood pressure has been significantly lower > running in 100's systolic.  She does have positional change lightheadedness. Denies syncope but does feel at times like she might pass out. See's spots at times.   She denies chest pain, dyspnea, PND, orthopnea, nausea, vomiting, syncope, edema, weight gain, or early satiety.   Review of systems complete and found to be negative unless listed in HPI.   EP Information / Studies Reviewed:    EKG is not ordered today. EKG from 10/03/23 reviewed which showed NSR 66 bpm      Studies:  Cardiac Monitor 02/2021 > HR 53-185, ave 83 bpm. 8 SVT runs fastest lasting 5 beats, longes lasting 9 beats.   ECHO 09/2023 > LVEF 60-65%, GIDD, RV systolic normal, LA mild-mod dilated, mod mitral annular calcification   Device:  MDT LINQ II > implanted 05/11/21 in setting of syncope  Physical Exam:   VS:  BP 108/64   Pulse (!) 57   Ht 5\' 2"  (1.575 m)   Wt 146 lb (66.2 kg)   SpO2 99%   BMI 26.70 kg/m    Wt Readings from Last 3 Encounters:  03/17/24 146 lb (66.2 kg)  02/13/24 150 lb 12.8 oz (68.4 kg)   01/17/24 148 lb 12.8 oz (67.5 kg)     GEN: Well nourished, well developed in no acute distress NECK: No JVD; No carotid bruits CARDIAC: Regular rate and rhythm, no murmurs, rubs, gallops RESPIRATORY:  Clear to auscultation without rales, wheezing or rhonchi  ABDOMEN: Soft, non-tender, non-distended EXTREMITIES:  No edema; No deformity   ASSESSMENT AND PLAN:    Lightheadedness / Dizziness  Hx Syncope  Sinus Bradycardia -MDT ILR review shows most recent symptom episode occurred on 03/09/24 > correlates with NSR  -symptom episodes on ILR associated with NSR with PAC's  -encouraged adequate hydration, compression stockings -reviewed position changes with patient  -ILR settings reviewed with patient (who is an Charity fundraiser)  -occ PAC's but no AF > bradycardia limits Beta Blocker   Hypertension  -well controlled on current regimen, ? Relative hypotension  -discussed with patient to reduce norvasc  to 2.5mg  BID and keep record of her symptoms and bring back with her to see Dr. Theodis Fiscal.     Follow up with Dr. Lawana Pray in 12 months  Signed, Creighton Doffing, NP-C, AGACNP-BC [facility] - Electrophysiology  03/17/2024, 9:17 AM

## 2024-03-16 NOTE — Progress Notes (Signed)
 Carelink Summary Report / Loop Recorder

## 2024-03-17 ENCOUNTER — Telehealth: Payer: Self-pay

## 2024-03-17 ENCOUNTER — Ambulatory Visit: Attending: Pulmonary Disease | Admitting: Pulmonary Disease

## 2024-03-17 ENCOUNTER — Encounter: Payer: Self-pay | Admitting: Pulmonary Disease

## 2024-03-17 VITALS — BP 108/64 | HR 57 | Ht 62.0 in | Wt 146.0 lb

## 2024-03-17 DIAGNOSIS — R001 Bradycardia, unspecified: Secondary | ICD-10-CM | POA: Diagnosis present

## 2024-03-17 DIAGNOSIS — I10 Essential (primary) hypertension: Secondary | ICD-10-CM | POA: Insufficient documentation

## 2024-03-17 LAB — BASIC METABOLIC PANEL WITH GFR
BUN/Creatinine Ratio: 22 (ref 12–28)
BUN: 29 mg/dL — ABNORMAL HIGH (ref 8–27)
CO2: 18 mmol/L — ABNORMAL LOW (ref 20–29)
Calcium: 9.9 mg/dL (ref 8.7–10.3)
Chloride: 104 mmol/L (ref 96–106)
Creatinine, Ser: 1.3 mg/dL — ABNORMAL HIGH (ref 0.57–1.00)
Glucose: 87 mg/dL (ref 70–99)
Potassium: 5.5 mmol/L — ABNORMAL HIGH (ref 3.5–5.2)
Sodium: 138 mmol/L (ref 134–144)
eGFR: 45 mL/min/{1.73_m2} — ABNORMAL LOW (ref >59)

## 2024-03-17 NOTE — Telephone Encounter (Signed)
Prolia VOB initiated via [email or web address]  Next Prolia inj DUE: new start

## 2024-03-17 NOTE — Patient Instructions (Addendum)
 Medication Instructions:  Reduce your Norvasc  to 2.5 mg BID (once in am, once in pm) until you see Dr. Theodis Fiscal on June 18th.  Please check your blood pressure at home and record. Bring to your clinic visit with her.  Consider wearing compression socks / bike shorts to help with lightheadedness / dizziness. Your cardiac monitor is reassuring.  We will call you with any arrhythmias.  Stay hydrated.     *If you need a refill on your cardiac medications before your next appointment, please call your pharmacy*  Lab Work: No lab work today If you have labs (blood work) drawn today and your tests are completely normal, you will receive your results only by: MyChart Message (if you have MyChart) OR A paper copy in the mail If you have any lab test that is abnormal or we need to change your treatment, we will call you to review the results.  Testing/Procedures: No testing/procedures were scheduled today  Follow-Up: At [facility], you and your health needs are our priority.  As part of our continuing mission to provide you with exceptional heart care, our providers are all part of one team.  This team includes your primary Cardiologist (physician) and Advanced Practice Providers or APPs (Physician Assistants and Nurse Practitioners) who all work together to provide you with the care you need, when you need it.  Your next appointment:   12 month(s)  Provider:   You may see Dr. Lawana Pray or one of the following Advanced Practice Providers on your designated Care Team:   Mertha Abrahams, PA-C Bambi Lever "[place]" [place], [place] Creighton Doffing, NP    We recommend signing up for the patient portal called "MyChart".  Sign up information is provided on this After Visit Summary.  MyChart is used to connect with patients for Virtual Visits (Telemedicine).  Patients are able to view lab/test results, encounter notes, upcoming appointments, etc.  Non-urgent messages can be sent to your provider as well.   To  learn more about what you can do with MyChart, go to [email or web address].

## 2024-03-18 ENCOUNTER — Telehealth: Payer: Self-pay

## 2024-03-18 LAB — LIPID PANEL
Chol/HDL Ratio: 1.8 ratio (ref 0.0–4.4)
Cholesterol, Total: 172 mg/dL (ref 100–199)
HDL: 96 mg/dL (ref >39)
LDL Chol Calc (NIH): 64 mg/dL (ref 0–99)
Triglycerides: 59 mg/dL (ref 0–149)
VLDL Cholesterol Cal: 12 mg/dL (ref 5–40)

## 2024-03-18 LAB — HEPATIC FUNCTION PANEL
ALT: 17 IU/L (ref 0–32)
AST: 25 IU/L (ref 0–40)
Albumin: 5 g/dL — ABNORMAL HIGH (ref 3.9–4.9)
Alkaline Phosphatase: 127 IU/L — ABNORMAL HIGH (ref 44–121)
Bilirubin Total: 0.4 mg/dL (ref 0.0–1.2)
Bilirubin, Direct: 0.21 mg/dL (ref 0.00–0.40)
Total Protein: 7.7 g/dL (ref 6.0–8.5)

## 2024-03-18 NOTE — Telephone Encounter (Signed)
 Debbie Hatfield

## 2024-03-18 NOTE — Telephone Encounter (Signed)
 Pt ready for scheduling for PROLIA on or after : 03/18/24  Option# 1: Buy/Bill (Office supplied medication)  Out-of-pocket cost due at time of clinic visit: $0  Number of injection/visits approved: ---  Primary: MEDICARE Prolia co-insurance: 0% Admin fee co-insurance: 0%  Secondary: [employer] [place] Prolia co-insurance:  Admin fee co-insurance:   Medical Benefit Details: Date Benefits were checked: 03/17/24 Deductible: $257 Met of $257 Required/ Coinsurance: 0%/ Admin Fee: 0%  Prior Auth: N/A PA# Expiration Date:   # of doses approved: ----------------------------------------------------------------------- Option# 2- Med Obtained from pharmacy:  Pharmacy benefit: Copay $--- (Paid to pharmacy) Admin Fee: --- (Pay at clinic)  Prior Auth: --- PA# Expiration Date:   # of doses approved:   If patient wants fill through the pharmacy benefit please send prescription to: ---, and include estimated need by date in rx notes. Pharmacy will ship medication directly to the office.  Patient NOT eligible for Prolia Copay Card. Copay Card can make patient's cost as little as $25. Link to apply: https://www.amgensupportplus.com/copay  ** This summary of benefits is an estimation of the patient's out-of-pocket cost. Exact cost may very based on individual plan coverage.

## 2024-03-20 MED ORDER — SPIRONOLACTONE 25 MG PO TABS
12.5000 mg | ORAL_TABLET | Freq: Every day | ORAL | 3 refills | Status: DC
Start: 2024-03-20 — End: 2024-09-03

## 2024-03-20 NOTE — Addendum Note (Signed)
 Addended by: Guss Legacy on: 03/20/2024 11:43 AM   Modules accepted: Orders

## 2024-03-24 ENCOUNTER — Ambulatory Visit (AMBULATORY_SURGERY_CENTER)

## 2024-03-24 VITALS — Ht 62.0 in | Wt 149.2 lb

## 2024-03-24 DIAGNOSIS — R1310 Dysphagia, unspecified: Secondary | ICD-10-CM

## 2024-03-24 LAB — BASIC METABOLIC PANEL WITH GFR
BUN/Creatinine Ratio: 18 (ref 12–28)
BUN: 21 mg/dL (ref 8–27)
CO2: 19 mmol/L — ABNORMAL LOW (ref 20–29)
Calcium: 9.6 mg/dL (ref 8.7–10.3)
Chloride: 105 mmol/L (ref 96–106)
Creatinine, Ser: 1.16 mg/dL — ABNORMAL HIGH (ref 0.57–1.00)
Glucose: 73 mg/dL (ref 70–99)
Potassium: 4.9 mmol/L (ref 3.5–5.2)
Sodium: 141 mmol/L (ref 134–144)
eGFR: 52 mL/min/{1.73_m2} — ABNORMAL LOW (ref >59)

## 2024-03-24 NOTE — Progress Notes (Signed)
 No egg or soy allergy known to patient  No issues known to pt with past sedation with any surgeries or procedures Patient denies ever being told they had issues or difficulty with intubation  No FH of Malignant Hyperthermia Pt is not on diet pills Pt is not on  home 02  Pt is not on blood thinners  No A fib or A flutter. Hx of PACs and PVCs. Hx of bradycardia (50s highest normal HR)  Have any cardiac testing pending-- no  LOA: independent    Patient's chart reviewed by Rogena Class CNRA prior to previsit and patient appropriate for the [facility].  Previsit completed and red dot placed by patient's name on their procedure day (on provider's schedule).     PV completed with patient. Prep instructions sent via mychart and home address.

## 2024-03-25 NOTE — Telephone Encounter (Signed)
 Medical Buy and Annette Stable - Prior Authorization NOT required for [employer]

## 2024-03-25 NOTE — Telephone Encounter (Signed)
 Medical Buy and Raenette Bumps  Patient is ready for scheduling on or after 03/25/24  Out-of-pocket cost due at time of visit: $0  Primary: [place]  Medicare Prolia  co-insurance: 20% (approximately $331.87) Admin fee co-insurance: 20% (approximately $25)  Deductible: $257 of $257 met  Prior Auth: NOT required  Secondary: [employer] Supplement Plan G Prolia  co-insurance: Covers Medicare Part B co-insurance Admin fee co-insurance: Covers Medicare Part B co-insurance  Deductible: does NOT cover Medicare deductible of which $257 of $257 has been met  Prior Auth:  PA# Valid:   ** This summary of benefits is an estimation of the patient's out-of-pocket cost. Exact cost may vary based on individual plan coverage.

## 2024-03-30 ENCOUNTER — Encounter: Payer: Self-pay | Admitting: Internal Medicine

## 2024-03-30 ENCOUNTER — Ambulatory Visit (INDEPENDENT_AMBULATORY_CARE_PROVIDER_SITE_OTHER): Admitting: Dermatology

## 2024-03-30 VITALS — BP 127/60 | HR 60

## 2024-03-30 DIAGNOSIS — W908XXA Exposure to other nonionizing radiation, initial encounter: Secondary | ICD-10-CM

## 2024-03-30 DIAGNOSIS — L820 Inflamed seborrheic keratosis: Secondary | ICD-10-CM | POA: Diagnosis not present

## 2024-03-30 DIAGNOSIS — L570 Actinic keratosis: Secondary | ICD-10-CM | POA: Diagnosis not present

## 2024-03-30 DIAGNOSIS — I781 Nevus, non-neoplastic: Secondary | ICD-10-CM

## 2024-03-30 DIAGNOSIS — C801 Malignant (primary) neoplasm, unspecified: Secondary | ICD-10-CM

## 2024-03-30 HISTORY — DX: Malignant (primary) neoplasm, unspecified: C80.1

## 2024-03-30 NOTE — Patient Instructions (Addendum)
 Cryotherapy Aftercare  Wash gently with soap and water everyday.   Apply Vaseline and Band-Aid daily until healed. Cryotherapy Aftercare  Wash gently with soap and water everyday.   Apply Vaseline and Band-Aid daily until healed.   Skin Education : We counseled the patient regarding the following: Sun screen (SPF 30 or greater) should be applied during peak UV exposure (between 10am and 2pm) and reapplied after exercise or swimming.  The ABCDEs of melanoma were reviewed with the patient, and the importance of monthly self-examination of moles was emphasized. Should any moles change in shape or color, or itch, bleed or burn, pt will contact our office for evaluation sooner then their interval appointment.  Plan: Sunscreen Recommendations Wd recommended a broad spectrum sunscreen with a SPF of 30 or higher. SPF 30 sunscreens block approximately 97 percent of the sun's harmful rays. Sunscreens should be applied at least 15 minutes prior to expected sun exposure and then every 2 hours after that as long as sun exposure continues. If swimming or exercising sunscreen should be reapplied every 45 minutes to an hour after getting wet or sweating. One ounce, or the equivalent of a shot glass full of sunscreen, is adequate to protect the skin not covered by a bathing suit. We also recommended a lip balm with a sunscreen as well. Sun protective clothing can be used in lieu of sunscreen but must be worn the entire time you are exposed to the sun's rays.   Important Information   Due to recent changes in healthcare laws, you may see results of your pathology and/or laboratory studies on MyChart before the doctors have had a chance to review them. We understand that in some cases there may be results that are confusing or concerning to you. Please understand that not all results are received at the same time and often the doctors may need to interpret multiple results in order to provide you with the best plan  of care or course of treatment. Therefore, we ask that you please give us  2 business days to thoroughly review all your results before contacting the office for clarification. Should we see a critical lab result, you will be contacted sooner.     If You Need Anything After Your Visit   If you have any questions or concerns for your doctor, please call our main line at [phone]. If no one answers, please leave a voicemail as directed and we will return your call as soon as possible. Messages left after 4 pm will be answered the following business day.    You may also send us  a message via MyChart. We typically respond to MyChart messages within 1-2 business days.  For prescription refills, please ask your pharmacy to contact our office. Our fax number is [phone].  If you have an urgent issue when the clinic is closed that cannot wait until the next business day, you can page your doctor at the number below.     Please note that while we do our best to be available for urgent issues outside of office hours, we are not available 24/7.    If you have an urgent issue and are unable to reach us , you may choose to seek medical care at your doctor's office, retail clinic, urgent care center, or emergency room.   If you have a medical emergency, please immediately call 911 or go to the emergency department. In the event of inclement weather, please call our main line at [phone] for an  update on the status of any delays or closures.  Dermatology Medication Tips: Please keep the boxes that topical medications come in in order to help keep track of the instructions about where and how to use these. Pharmacies typically print the medication instructions only on the boxes and not directly on the medication tubes.   If your medication is too expensive, please contact our office at [phone] or send us  a message through MyChart.    We are unable to tell what your co-pay for medications will  be in advance as this is different depending on your insurance coverage. However, we may be able to find a substitute medication at lower cost or fill out paperwork to get insurance to cover a needed medication.    If a prior authorization is required to get your medication covered by your insurance company, please allow us  1-2 business days to complete this process.   Drug prices often vary depending on where the prescription is filled and some pharmacies may offer cheaper prices.   The website www.goodrx.com contains coupons for medications through different pharmacies. The prices here do not account for what the cost may be with help from insurance (it may be cheaper with your insurance), but the website can give you the price if you did not use any insurance.  - You can print the associated coupon and take it with your prescription to the pharmacy.  - You may also stop by our office during regular business hours and pick up a GoodRx coupon card.  - If you need your prescription sent electronically to a different pharmacy, notify our office through [facility] or by phone at [phone]

## 2024-03-30 NOTE — Progress Notes (Signed)
 New Patient Visit   Subjective  Debbie Hatfield is a 68 y.o. female who presents for the following: spot check The patient has spots on the face she'd like evaluated  Pt had Mohs on the nose 5 years ago but was told it was a hemangioma. Pt has a spot on her nose and lip she'd like , present for several months, gritty and itchy.   The following portions of the chart were reviewed this encounter and updated as appropriate: medications, allergies, medical history  Review of Systems:  No other skin or systemic complaints except as noted in HPI or Assessment and Plan.  Objective  Well appearing patient in no apparent distress; mood and affect are within normal limits.  A focused examination was performed of the following areas: face Back Chest Relevant exam findings are noted in the Assessment and Plan.  Mid [place] Lip, mid chest , right shoulder (3) Erythematous thin papules/macules with gritty scale.   Assessment & Plan   Spider angioma tip of nose Exam: red papule(s) Discussed benign nature. Recommend observation. Call for changes.  Will monitor; photo taken today  ACTINIC KERATOSIS Exam: Erythematous thin papules/macules with gritty scale  Actinic keratoses are precancerous spots that appear secondary to cumulative UV radiation exposure/sun exposure over time. They are chronic with expected duration over 1 year. A portion of actinic keratoses will progress to squamous cell carcinoma of the skin. It is not possible to reliably predict which spots will progress to skin cancer and so treatment is recommended to prevent development of skin cancer.  Recommend daily broad spectrum sunscreen SPF 30+ to sun-exposed areas, reapply every 2 hours as needed.  Recommend staying in the shade or wearing long sleeves, sun glasses (UVA+UVB protection) and wide brim hats (4-inch brim around the entire circumference of the hat). Call for new or changing lesions.  Treatment  Plan:  Prior to procedure, discussed risks of blister formation, small wound, skin dyspigmentation, or rare scar following cryotherapy. Recommend Vaseline ointment to treated areas while healing.  Destruction Procedure Note Destruction method: cryotherapy   Informed consent: discussed and consent obtained   Lesion destroyed using liquid nitrogen: Yes   Outcome: patient tolerated procedure well with no complications   Post-procedure details: wound care instructions given     INFLAMED SEBORRHEIC KERATOSIS Exam: Erythematous keratotic or waxy stuck-on papule or plaque.  Symptomatic, irritating, patient would like treated.  Benign-appearing.  Call clinic for new or changing lesions.   Prior to procedure, discussed risks of blister formation, small wound, skin dyspigmentation, or rare scar following treatment. Recommend Vaseline ointment to treated areas while healing.  Destruction Procedure Note Destruction method: cryotherapy   Informed consent: discussed and consent obtained   Lesion destroyed using liquid nitrogen: Yes   Outcome: patient tolerated procedure well with no complications   Post-procedure details: wound care instructions given   Locations: right back # of Lesions Treated: 1  AK (ACTINIC KERATOSIS) (3) Mid [place] Lip, mid chest , right shoulder (3) Destruction of lesion - Mid [place] Lip, mid chest , right shoulder (3) Complexity: simple   Destruction method: cryotherapy   Informed consent: discussed and consent obtained   Timeout:  patient name, date of birth, surgical site, and procedure verified Outcome: patient tolerated procedure well with no complications   Post-procedure details: wound care instructions given   INFLAMED SEBORRHEIC KERATOSIS Right Upper Back Destruction of lesion - Right Upper Back Complexity: simple   Destruction method: cryotherapy  Informed consent: discussed and consent obtained   Timeout:  patient name, date of  birth, surgical site, and procedure verified Outcome: patient tolerated procedure well with no complications   Post-procedure details: wound care instructions given    Return in about 2 months (around 05/30/2024) for growth on nose.  I, Wilson Hasten, CMA, am acting as scribe for Deneise Finlay, MD.   Documentation: I have reviewed the above documentation for accuracy and completeness, and I agree with the above.  Deneise Finlay, MD

## 2024-03-31 ENCOUNTER — Telehealth (HOSPITAL_COMMUNITY): Payer: Self-pay

## 2024-03-31 ENCOUNTER — Encounter: Payer: Self-pay | Admitting: Gastroenterology

## 2024-03-31 ENCOUNTER — Ambulatory Visit: Admitting: Gastroenterology

## 2024-03-31 VITALS — BP 154/60 | HR 75 | Temp 97.7°F | Resp 13 | Ht 62.0 in | Wt 149.0 lb

## 2024-03-31 DIAGNOSIS — K222 Esophageal obstruction: Secondary | ICD-10-CM | POA: Diagnosis not present

## 2024-03-31 DIAGNOSIS — K449 Diaphragmatic hernia without obstruction or gangrene: Secondary | ICD-10-CM | POA: Diagnosis not present

## 2024-03-31 DIAGNOSIS — K2289 Other specified disease of esophagus: Secondary | ICD-10-CM

## 2024-03-31 DIAGNOSIS — K219 Gastro-esophageal reflux disease without esophagitis: Secondary | ICD-10-CM

## 2024-03-31 DIAGNOSIS — R1310 Dysphagia, unspecified: Secondary | ICD-10-CM

## 2024-03-31 DIAGNOSIS — K229 Disease of esophagus, unspecified: Secondary | ICD-10-CM | POA: Diagnosis not present

## 2024-03-31 MED ORDER — PANTOPRAZOLE SODIUM 40 MG PO TBEC: 40.0000 mg | DELAYED_RELEASE_TABLET | Freq: Two times a day (BID) | ORAL | 13 refills | Status: DC

## 2024-03-31 MED ORDER — SODIUM CHLORIDE 0.9 % IV SOLN: 500.0000 mL | Freq: Once | INTRAVENOUS | Status: DC

## 2024-03-31 NOTE — Progress Notes (Signed)
 Sedate, gd SR, tolerated procedure well, VSS, report to RN

## 2024-03-31 NOTE — Progress Notes (Signed)
 Called to room to assist during endoscopic procedure.  Patient ID and intended procedure confirmed with present staff. Received instructions for my participation in the procedure from the performing physician.

## 2024-03-31 NOTE — Op Note (Addendum)
 [facility] Patient Name: Debbie Hatfield Procedure Date: 03/31/2024 3:30 PM MRN: [id number] Endoscopist: Yong Henle , MD, [id number] Age: 68 Referring MD:  Date of Birth: 1956/08/04 Gender: Female Account #: [id number] Procedure:                Upper GI endoscopy Indications:              Dysphagia, Esophageal reflux symptoms that persist                            despite appropriate therapy (patient had further                            improvement in pyrosis when on twice daily PPI) Medicines:                Monitored Anesthesia Care Procedure:                Pre-Anesthesia Assessment:                           - Prior to the procedure, a History and Physical                            was performed, and patient medications and                            allergies were reviewed. The patient's tolerance of                            previous anesthesia was also reviewed. The risks                            and benefits of the procedure and the sedation                            options and risks were discussed with the patient.                            All questions were answered, and informed consent                            was obtained. Prior Anticoagulants: The patient has                            taken Plavix  (clopidogrel ), last dose was 5 days                            prior to procedure. ASA Grade Assessment: II - A                            patient with mild systemic disease. After reviewing                            the risks and benefits, the patient was deemed in  satisfactory condition to undergo the procedure.                           After obtaining informed consent, the endoscope was                            passed under direct vision. Throughout the                            procedure, the patient's blood pressure, pulse, and                            oxygen saturations were monitored continuously. The                             GIF [id number] #[id number] was introduced through the                            mouth, and advanced to the second part of duodenum.                            The upper GI endoscopy was accomplished without                            difficulty. The patient tolerated the procedure. Scope In: Scope Out: Findings:                 No gross lesions were noted in the entire                            esophagus. Biopsies were taken with a cold forceps                            for histology to rule out EOE/LOE. After the rest                            of the EGD was completed, a guidewire was placed                            and the scope was withdrawn. Dilation was performed                            with a Savary dilator with no resistance at 18 mm                            and mild resistance at 19 mm. The dilation site was                            examined following endoscope reinsertion and showed                            no change.  A widely patent Schatzki ring was found at the                            gastroesophageal junction. Disruption biopsies were                            taken with a cold forceps for histology.                           A 3 cm hiatal hernia was present.                           No gross lesions were noted in the entire examined                            stomach.                           No gross lesions were noted in the duodenal bulb,                            in the first portion of the duodenum and in the                            second portion of the duodenum. Complications:            No immediate complications. Estimated Blood Loss:     Estimated blood loss was minimal. Impression:               - No gross lesions in the entire esophagus.                            Biopsied. Dilated.                           - Widely patent Schatzki ring. Biopsied.                           - 3 cm hiatal hernia.                            - No gross lesions in the entire stomach.                           - No gross lesions in the duodenal bulb, in the                            first portion of the duodenum and in the second                            portion of the duodenum. Recommendation:           - The patient will be observed post-procedure,  until all discharge criteria are met.                           - Discharge patient to home.                           - Patient has a contact number available for                            emergencies. The signs and symptoms of potential                            delayed complications were discussed with the                            patient. Return to normal activities tomorrow.                            Written discharge instructions were provided to the                            patient.                           - Please use Cepacol or Halls Lozenges +/-                            Chloraseptic spray for next 72-96 hours to aid in                            sore thoat should you experience this.                           - Observe patient's clinical course.                           - Await pathology results.                           - Twice daily Protonix  40 mg has allowed patient to                            have significant improvement in pyrosis. I                            recommend that patient continue on this rather than                            transition PPI. This will act as documentation                            purposes for patient's pharmacy and insurance if                            needed.                           -  May restart Plavix  on 6/19 to decrease risk of                            post interventional bleeding.                           - Continue present medications otherwise.                           - If dysphagia symptoms do not improve                            significantly for an  extended period of time, then                            esophageal manometry will be considered and the                            role of hiatal hernia repair should be considered                            as well.                           - If dysphagia symptoms do improve for an extended                            period of time and then recur, then can plan to                            repeat dilation up to 19 or 20 mm.                           - The findings and recommendations were discussed                            with the patient.                           - The findings and recommendations were discussed                            with the patient's family. Yong Henle, MD 03/31/2024 4:18:53 PM

## 2024-03-31 NOTE — Patient Instructions (Addendum)
 - Please use Cepacol or Halls Lozenges +/-                            Chloraseptic spray for next 72-96 hours to aid in                            sore thoat should you experience this.                           - Observe patient's clinical course.                           - Await pathology results.                           - Twice daily Protonix  40 mg has allowed patient to                            have significant improvement in pyrosis. I                            recommend that patient continue on this rather than                            transition PPI. This will act as documentation                            purposes for patient's pharmacy and insurance if                            needed.                           - May restart Plavix  on 6/19 to decrease risk of                            post interventional bleeding.                           - Continue present medications otherwise.                           - If dysphagia symptoms do not improve                            significantly for an extended period of time, then                            esophageal manometry will be considered and the                            role of hiatal hernia repair should be considered                            as  well.                           - If dysphagia symptoms do improve for an extended                            period of time and then recur, then can plan to                            repeat dilation up to 19 or 20 mm.                           - The findings and recommendations were discussed                            with the patient.                           - The findings and recommendations were discussed                            with the patient's family. Yong Henle, MD 03/31/2024 4:18:53 PM       YOU HAD AN ENDOSCOPIC PROCEDURE TODAY AT THE  ENDOSCOPY CENTER:   Refer to the procedure report that was given to you for any specific questions  about what was found during the examination.  If the procedure report does not answer your questions, please call your gastroenterologist to clarify.  If you requested that your care partner not be given the details of your procedure findings, then the procedure report has been included in a sealed envelope for you to review at your convenience later.  YOU SHOULD EXPECT: Some feelings of bloating in the abdomen. Passage of more gas than usual.  Walking can help get rid of the air that was put into your GI tract during the procedure and reduce the bloating. If you had a lower endoscopy (such as a colonoscopy or flexible sigmoidoscopy) you may notice spotting of blood in your stool or on the toilet paper. If you underwent a bowel prep for your procedure, you may not have a normal bowel movement for a few days.  Please Note:  You might notice some irritation and congestion in your nose or some drainage.  This is from the oxygen used during your procedure.  There is no need for concern and it should clear up in a day or so.  SYMPTOMS TO REPORT IMMEDIATELY:   Following upper endoscopy (EGD)  Vomiting of blood or coffee ground material  New chest pain or pain under the shoulder blades  Painful or persistently difficult swallowing  New shortness of breath  Fever of 100F or higher  Black, tarry-looking stools  For urgent or emergent issues, a gastroenterologist can be reached at any hour by calling [phone]. Do not use MyChart messaging for urgent concerns.    DIET:  We do recommend a small meal at first, but then you may proceed to your regular diet.  Drink plenty of fluids but you should avoid alcoholic beverages for 24 hours.  ACTIVITY:  You should plan to take it easy for the rest of today and you should NOT DRIVE or use  heavy machinery until tomorrow (because of the sedation medicines used during the test).    FOLLOW UP: Our staff will call the number listed on your records the next  business day following your procedure.  We will call around 7:15- 8:00 am to check on you and address any questions or concerns that you may have regarding the information given to you following your procedure. If we do not reach you, we will leave a message.     If any biopsies were taken you will be contacted by phone or by letter within the next 1-3 weeks.  Please call us  at [phone] if you have not heard about the biopsies in 3 weeks.    SIGNATURES/CONFIDENTIALITY: You and/or your care partner have signed paperwork which will be entered into your electronic medical record.  These signatures attest to the fact that that the information above on your After Visit Summary has been reviewed and is understood.  Full responsibility of the confidentiality of this discharge information lies with you and/or your care-partner.

## 2024-03-31 NOTE — Telephone Encounter (Signed)
 Patient calling for a refill on her Doxepin  and Effexor . After reviewing the chart, I explained to the patient that she was supposed to be stopping those medications and starting Amitriptyline . Patient told me that she had come by the office with a letter stating that she was not going to take the Amitriptyline  and she was going back to the Effexor  and Doxepin . Front Information systems manager did not have the MA or the nurse speak to the patient, they put the note on Debbie Hatfield's desk. Nothing was entered into the chart. I spoke with [place] and she states that she needs to see the patient to discuss. We put her in as a virtual appt. Tomorrow morning.I called the patient and left a voicemail letting her know and asking for a call back.

## 2024-03-31 NOTE — Progress Notes (Signed)
 GASTROENTEROLOGY PROCEDURE H&P NOTE   Primary Care Physician: Arcadio Knuckles, MD  HPI: Debbie Hatfield is a 68 y.o. female who presents for EGD for dysphagia.  Past Medical History:  Diagnosis Date   AKI (acute kidney injury) (HCC) 10/03/2023   Closed fracture of right distal radius    Degeneration of lumbar intervertebral disc 01/26/2020   Depression with anxiety 10/03/2023   GAD (generalized anxiety disorder) 11/15/2021   Gastroparesis    GERD (gastroesophageal reflux disease)    History of CVA (cerebrovascular accident) 10/03/2023   Hyperlipidemia LDL goal <100 03/26/2022   The ASCVD Risk score (Arnett DK, et al., 2019) failed to calculate for the following reasons:    The valid HDL cholesterol range is 20 to 100 mg/dL     Hypertension    Hypotension 10/03/2023   Irritable bowel disease    Lupus    Major depression    Morton's neuroma of left foot 08/30/2023   Near syncope 10/03/2023   Need for shingles vaccine 11/15/2021   Need for vaccination 11/15/2021   Nonrheumatic mitral valve regurgitation 05/10/2022   Osteopenia 12/14/2015   Peptic ulcer disease 12/14/2015   Recurrent major depressive disorder, in partial remission (HCC) 11/15/2021   Sinus bradycardia 10/03/2023   Stage 3a chronic kidney disease (HCC) 11/15/2021   Estimated Creatinine Clearance: 49.7 mL/min (by C-G formula based on SCr of 1.01 mg/dL).      Estimated Creatinine Clearance: 45.9 mL/min (A) (by C-G formula based on SCr of 1.07 mg/dL (H)).      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   Stroke [facility])    Trigeminal neuralgia    Visit for screening mammogram 11/15/2021   Past Surgical History:  Procedure Laterality Date   BUBBLE STUDY  07/05/2022   Procedure: BUBBLE STUDY;  Surgeon: Euell Herrlich, MD;  Location: [facility] ENDOSCOPY;  Service: Cardiology;;   CARPAL TUNNEL RELEASE Left 05/15/2023   CHOLECYSTECTOMY     COLONOSCOPY     LOOP RECORDER IMPLANT  2021   ORIF WRIST FRACTURE Right 09/22/2020    Procedure: OPEN REDUCTION INTERNAL FIXATION (ORIF) WRIST FRACTURE;  Surgeon: Oralia Bills, MD;  Location: [facility];  Service: Orthopedics;  Laterality: Right;    TEE WITHOUT CARDIOVERSION N/A 07/05/2022   Procedure: TRANSESOPHAGEAL ECHOCARDIOGRAM (TEE);  Surgeon: Euell Herrlich, MD;  Location: [facility] ENDOSCOPY;  Service: Cardiology;  Laterality: N/A;   Current Outpatient Medications  Medication Sig Dispense Refill   ACETAMINOPHEN  PO Take 1,300 mg by mouth in the morning and at bedtime.     acyclovir  (ZOVIRAX ) 400 MG tablet Take 1 tablet (400 mg total) by mouth daily. 90 tablet 1   amLODipine  (NORVASC ) 2.5 MG tablet Take 1 tablet (2.5 mg total) by mouth at bedtime. CONTINUE 5 MG TABLETS IN MORNING (Patient taking differently: Take 2.5 mg by mouth 2 (two) times daily.) 90 tablet 3   baclofen  (LIORESAL ) 10 MG tablet Take 10 mg by mouth as needed. (Patient not taking: Reported on 03/24/2024)     Cholecalciferol 125 MCG (5000 UT) capsule Take 5,000 Units by mouth daily.     clonazePAM  (KLONOPIN ) 0.5 MG tablet Take 2 tablets (1 mg total) by mouth at bedtime as needed for anxiety. 30 tablet 5   clopidogrel  (PLAVIX ) 75 MG tablet One po qd 90 tablet 4   doxepin  (SINEQUAN ) 50 MG capsule Take 50 mg by mouth daily.     gabapentin  (NEURONTIN ) 300 MG capsule Take 600 mg by mouth 2 (  two) times daily.     pantoprazole  (PROTONIX ) 40 MG tablet Take 1 tablet (40 mg total) by mouth 2 (two) times daily before a meal. 60 tablet 12   pravastatin  (PRAVACHOL ) 20 MG tablet Take 1 tablet (20 mg total) by mouth 3 (three) times a week. 36 tablet 0   spironolactone  (ALDACTONE ) 25 MG tablet Take 0.5 tablets (12.5 mg total) by mouth daily. 45 tablet 3   telmisartan  (MICARDIS ) 40 MG tablet Take 1 tablet (40 mg total) by mouth daily. 30 tablet 3   tizanidine  (ZANAFLEX ) 2 MG capsule Take 4 mg by mouth at bedtime.     traMADol  (ULTRAM ) 50 MG tablet Take 50 mg by mouth.     Current  Facility-Administered Medications  Medication Dose Route Frequency Provider Last Rate Last Admin   denosumab  (PROLIA ) injection 60 mg  60 mg Subcutaneous Once         Current Outpatient Medications:    ACETAMINOPHEN  PO, Take 1,300 mg by mouth in the morning and at bedtime., Disp: , Rfl:    acyclovir  (ZOVIRAX ) 400 MG tablet, Take 1 tablet (400 mg total) by mouth daily., Disp: 90 tablet, Rfl: 1   amLODipine  (NORVASC ) 2.5 MG tablet, Take 1 tablet (2.5 mg total) by mouth at bedtime. CONTINUE 5 MG TABLETS IN MORNING (Patient taking differently: Take 2.5 mg by mouth 2 (two) times daily.), Disp: 90 tablet, Rfl: 3   baclofen  (LIORESAL ) 10 MG tablet, Take 10 mg by mouth as needed. (Patient not taking: Reported on 03/24/2024), Disp: , Rfl:    Cholecalciferol 125 MCG (5000 UT) capsule, Take 5,000 Units by mouth daily., Disp: , Rfl:    clonazePAM  (KLONOPIN ) 0.5 MG tablet, Take 2 tablets (1 mg total) by mouth at bedtime as needed for anxiety., Disp: 30 tablet, Rfl: 5   clopidogrel  (PLAVIX ) 75 MG tablet, One po qd, Disp: 90 tablet, Rfl: 4   doxepin  (SINEQUAN ) 50 MG capsule, Take 50 mg by mouth daily., Disp: , Rfl:    gabapentin  (NEURONTIN ) 300 MG capsule, Take 600 mg by mouth 2 (two) times daily., Disp: , Rfl:    pantoprazole  (PROTONIX ) 40 MG tablet, Take 1 tablet (40 mg total) by mouth 2 (two) times daily before a meal., Disp: 60 tablet, Rfl: 12   pravastatin  (PRAVACHOL ) 20 MG tablet, Take 1 tablet (20 mg total) by mouth 3 (three) times a week., Disp: 36 tablet, Rfl: 0   spironolactone  (ALDACTONE ) 25 MG tablet, Take 0.5 tablets (12.5 mg total) by mouth daily., Disp: 45 tablet, Rfl: 3   telmisartan  (MICARDIS ) 40 MG tablet, Take 1 tablet (40 mg total) by mouth daily., Disp: 30 tablet, Rfl: 3   tizanidine  (ZANAFLEX ) 2 MG capsule, Take 4 mg by mouth at bedtime., Disp: , Rfl:    traMADol  (ULTRAM ) 50 MG tablet, Take 50 mg by mouth., Disp: , Rfl:   Current Facility-Administered Medications:    denosumab  (PROLIA )  injection 60 mg, 60 mg, Subcutaneous, Once,  Allergies  Allergen Reactions   Plaquenil  [Hydroxychloroquine ]     Retinal damage   Sulfa Antibiotics Nausea And Vomiting   Sulfamethoxazole-Trimethoprim Nausea And Vomiting   Penicillins Rash   Family History  Problem Relation Age of Onset   Heart block Mother        Pacemaker   Hypertension Mother    Depression Mother    Gout Mother    Macular degeneration Mother    Aneurysm Mother    Alcoholism Father    Heart failure Maternal Grandmother  Hypertension Maternal Grandmother    Congestive Heart Failure Maternal Grandmother    Diabetes Maternal Grandmother    Congestive Heart Failure Maternal Grandfather    Colon cancer Maternal Grandfather    Stroke Paternal Grandfather    Hypertension Paternal Grandfather    Esophageal cancer Neg Hx    Stomach cancer Neg Hx    Rectal cancer Neg Hx    Social History   Socioeconomic History   Marital status: Widowed    Spouse name: Not on file   Number of children: 0   Years of education: Not on file   Highest education level: Bachelor's degree (e.g., BA, AB, BS)  Occupational History    Comment: RN ortho/neuro at [employer]  Tobacco Use   Smoking status: Never    Passive exposure: Never   Smokeless tobacco: Never  Vaping Use   Vaping status: Never Used  Substance and Sexual Activity   Alcohol use: Yes    Comment: social   Drug use: Never   Sexual activity: Not Currently    Birth control/protection: Post-menopausal  Other Topics Concern   Not on file  Social History Narrative   06/13/22 living with her mother   2 [place] Dew per Week   Social Drivers of Health   Financial Resource Strain: Low Risk  (03/30/2024)   Overall Financial Resource Strain (CARDIA)    Difficulty of Paying Living Expenses: Not very hard  Food Insecurity: No Food Insecurity (03/30/2024)   Hunger Vital Sign    Worried About Running Out of Food in the Last Year: Never true    Ran Out of Food in the Last Year:  Never true  Transportation Needs: No Transportation Needs (03/30/2024)   PRAPARE - Administrator, Civil Service (Medical): No    Lack of Transportation (Non-Medical): No  Physical Activity: Sufficiently Active (03/30/2024)   Exercise Vital Sign    Days of Exercise per Week: 5 days    Minutes of Exercise per Session: 60 min  Stress: Stress Concern Present (03/30/2024)   [employer] of Occupational Health - Occupational Stress Questionnaire    Feeling of Stress: To some extent  Social Connections: Socially Isolated (03/30/2024)   Social Connection and Isolation Panel    Frequency of Communication with Friends and Family: More than three times a week    Frequency of Social Gatherings with Friends and Family: Twice a week    Attends Religious Services: Patient declined    Database administrator or Organizations: No    Attends Engineer, structural: Not on file    Marital Status: Widowed  Intimate Partner Violence: Not At Risk (10/03/2023)   Humiliation, Afraid, Rape, and Kick questionnaire    Fear of Current or Ex-Partner: No    Emotionally Abused: No    Physically Abused: No    Sexually Abused: No    Physical Exam: There were no vitals filed for this visit. There is no height or weight on file to calculate BMI. GEN: NAD EYE: Sclerae anicteric ENT: MMM CV: Non-tachycardic GI: Soft, NT/ND NEURO:  Alert & Oriented x 3  Lab Results: No results for input(s): WBC, HGB, HCT, PLT in the last 72 hours. BMET No results for input(s): NA, K, CL, CO2, GLUCOSE, BUN, CREATININE, CALCIUM  in the last 72 hours. LFT No results for input(s): PROT, ALBUMIN, AST, ALT, ALKPHOS, BILITOT, BILIDIR, IBILI in the last 72 hours. PT/INR No results for input(s): LABPROT, INR in the last 72 hours.   Impression /  Plan: This is a 68 y.o.female who presents for EGD for dysphagia.  The risks and benefits of endoscopic evaluation/treatment  were discussed with the patient and/or family; these include but are not limited to the risk of perforation, infection, bleeding, missed lesions, lack of diagnosis, severe illness requiring hospitalization, as well as anesthesia and sedation related illnesses.  The patient's history has been reviewed, patient examined, no change in status, and deemed stable for procedure.  The patient and/or family is agreeable to proceed.    Yong Henle, MD [facility] Office # [phone]

## 2024-03-31 NOTE — Progress Notes (Signed)
 Pt's states no medical or surgical changes since previsit or office visit.

## 2024-04-01 ENCOUNTER — Telehealth (HOSPITAL_BASED_OUTPATIENT_CLINIC_OR_DEPARTMENT_OTHER): Admitting: Family

## 2024-04-01 ENCOUNTER — Ambulatory Visit (HOSPITAL_BASED_OUTPATIENT_CLINIC_OR_DEPARTMENT_OTHER): Admitting: Cardiovascular Disease

## 2024-04-01 ENCOUNTER — Encounter (HOSPITAL_BASED_OUTPATIENT_CLINIC_OR_DEPARTMENT_OTHER): Payer: Self-pay | Admitting: Cardiovascular Disease

## 2024-04-01 ENCOUNTER — Telehealth: Payer: Self-pay | Admitting: Lactation Services

## 2024-04-01 VITALS — BP 144/62 | HR 59 | Ht 62.0 in | Wt 148.4 lb

## 2024-04-01 DIAGNOSIS — F332 Major depressive disorder, recurrent severe without psychotic features: Secondary | ICD-10-CM | POA: Diagnosis not present

## 2024-04-01 DIAGNOSIS — I10 Essential (primary) hypertension: Secondary | ICD-10-CM

## 2024-04-01 DIAGNOSIS — Z6827 Body mass index (BMI) 27.0-27.9, adult: Secondary | ICD-10-CM

## 2024-04-01 DIAGNOSIS — G479 Sleep disorder, unspecified: Secondary | ICD-10-CM

## 2024-04-01 DIAGNOSIS — E785 Hyperlipidemia, unspecified: Secondary | ICD-10-CM

## 2024-04-01 DIAGNOSIS — N1831 Chronic kidney disease, stage 3a: Secondary | ICD-10-CM

## 2024-04-01 DIAGNOSIS — I63311 Cerebral infarction due to thrombosis of right middle cerebral artery: Secondary | ICD-10-CM

## 2024-04-01 MED ORDER — DOXEPIN HCL 50 MG PO CAPS: 50.0000 mg | ORAL_CAPSULE | Freq: Every day | ORAL | 2 refills | Status: DC

## 2024-04-01 MED ORDER — VENLAFAXINE HCL ER 37.5 MG PO CP24: 37.5000 mg | ORAL_CAPSULE | Freq: Every day | ORAL | 0 refills | Status: DC

## 2024-04-01 NOTE — Progress Notes (Signed)
 [facility] Follow Up:    Date:  04/01/2024   ID:  Debbie Hatfield, DOB 1956/03/21, MRN [id number]  PCP:  Arcadio Knuckles, MD  Cardiologist:  Zoe Hinds, MD   Referring MD: Arcadio Knuckles, MD   CC: Hypertension  History of Present Illness:    Debbie Hatfield is a 68 y.o. female with a hx of resistant hypertension CVA, hyperlipidemia, nonobstructive CAD, and hyperlipidemia here for follow up.  She has a history of stroke of the middle cerebral artery.  She was incidentally found to have a PFO that was not thought to be.  She had been following with cardiology and last saw Dr. Emmette Harms 01/2024.  Blood pressure has been uncontrolled despite most recently being on spironolactone , telmisartan , and chlorthalidone.  She hs been unable to be on beta-blockers due to bradycardia.  At her visit 02/2024 she noted significant fluctuations in her blood pressure ranging as high as 200s over 100s.  She developed hyperkalemia on spironolactone  75 mg daily.  She was also had episodes of bradycardia and hypotension lasting up to 2 hours.  This required hospitalization and epinephrine  infusion. She has a history of bradycardia and has a loop recorder implanted, which has recorded frequent episodes of low heart rate. She feels symptomatic during these episodes, including lightheadedness and fatigue. Her family history includes several members with pacemakers, and her mother had a third-degree heart block.  At her initial visit amlodipine  was changed to 5 mg in the mornings and 2.5 mg at night.  Catecholamines and metanephrines were elevated on plasma but within normal limits on urine testing.  She was advised to limit her sodium intake.  She was also referred to GI due to dysphagia.  Debbie Hatfield was seen in EP and reported an episode of dizziness, at which time sinus rhythm with PACs was noted.  Discussed the use of AI scribe software for clinical note transcription with the patient, who gave verbal  consent to proceed.  History of Present Illness Debbie Hatfield has a history of hypertension with previous readings as high as 200/110 mmHg. Her current medication regimen includes amlodipine , spironolactone , and telmisartan , resulting in average blood pressure readings in the 120s. She experiences lightheadedness when her blood pressure drops to 100/60 mmHg. Her heart rate is on the lower side, with a recent reading of 51 bpm, which she finds bothersome.  She recently underwent an esophageal dilation procedure yesterday, which significantly improved her symptoms, eliminating the pressure she previously felt. She describes some residual soreness, which feels different than before.  She has a history of depression and anxiety, previously managed with Effexor  and doxepin . A recent change to Elavil  25 mg, increasing to 50 mg, was not effective, leading to increased depression. She has since reverted to her previous medications after consulting with her psychiatrist.  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  She will be starting Prolia  next week. She reports a past stress fracture that has improved but remains slightly sore.  In terms of lifestyle, she has made significant dietary changes, eliminating sweet tea and [facility] in favor of water, which she believes has improved her kidney function. She walks her dog daily, covering over a mile, and reports no leg swelling or pain during these activities.  She mentions a sleep study that was initiated but has not received any follow-up communication regarding the results.  Previous antihypertensives: hctZ Lisinopril/hydrochlorothiazide  Clonidine  Nebivolol  Clonidine    Past Medical History:  Diagnosis Date   AKI (acute kidney injury) (HCC) 10/03/2023   Cancer (HCC) 03/30/2024   sqamous cell ca lip and back   Closed fracture of right distal radius     Degeneration of lumbar intervertebral disc 01/26/2020   Depression with anxiety 10/03/2023   GAD (generalized anxiety disorder) 11/15/2021   Gastroparesis    GERD (gastroesophageal reflux disease)    History of CVA (cerebrovascular accident) 10/03/2023   Hyperlipidemia LDL goal <100 03/26/2022   The ASCVD Risk score (Arnett DK, et al., 2019) failed to calculate for the following reasons:    The valid HDL cholesterol range is 20 to 100 mg/dL     Hypertension    Hypotension 10/03/2023   Irritable bowel disease    Lupus    Major depression    Morton's neuroma of left foot 08/30/2023   Near syncope 10/03/2023   Need for shingles vaccine 11/15/2021   Need for vaccination 11/15/2021   Nonrheumatic mitral valve regurgitation 05/10/2022   Osteopenia 12/14/2015   Peptic ulcer disease 12/14/2015   Recurrent major depressive disorder, in partial remission (HCC) 11/15/2021   Sinus bradycardia 10/03/2023   Stage 3a chronic kidney disease (HCC) 11/15/2021   Estimated Creatinine Clearance: 49.7 mL/min (by C-G formula based on SCr of 1.01 mg/dL).      Estimated Creatinine Clearance: 45.9 mL/min (A) (by C-G formula based on SCr of 1.07 mg/dL (H)).      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   Stroke [facility])    Trigeminal neuralgia    Visit for screening mammogram 11/15/2021    Past Surgical History:  Procedure Laterality Date   BUBBLE STUDY  07/05/2022   Procedure: BUBBLE STUDY;  Surgeon: Euell Herrlich, MD;  Location: [facility] ENDOSCOPY;  Service: Cardiology;;   CARPAL TUNNEL RELEASE Left 05/15/2023   CHOLECYSTECTOMY     COLONOSCOPY     LOOP RECORDER IMPLANT  2021   ORIF WRIST FRACTURE Right 09/22/2020   Procedure: OPEN REDUCTION INTERNAL FIXATION (ORIF) WRIST FRACTURE;  Surgeon: Oralia Bills, MD;  Location: [facility];  Service: Orthopedics;  Laterality: Right;    TEE WITHOUT CARDIOVERSION N/A 07/05/2022   Procedure: TRANSESOPHAGEAL ECHOCARDIOGRAM (TEE);  Surgeon:  Euell Herrlich, MD;  Location: [facility] ENDOSCOPY;  Service: Cardiology;  Laterality: N/A;   UPPER GASTROINTESTINAL ENDOSCOPY      Current Medications: Current Meds  Medication Sig   ACETAMINOPHEN  PO Take 1,300 mg by mouth in the morning and at bedtime.   acyclovir  (ZOVIRAX ) 400 MG tablet Take 1 tablet (400 mg total) by mouth daily.   amLODipine  (NORVASC ) 2.5 MG tablet Take 1 tablet (2.5 mg total) by mouth at bedtime. CONTINUE 5 MG TABLETS IN MORNING (Patient taking differently: Take 2.5 mg by mouth 2 (two) times daily.)   Cholecalciferol 125 MCG (5000 UT) capsule Take 5,000 Units by mouth daily.   clopidogrel  (PLAVIX ) 75 MG tablet One po qd   doxepin  (SINEQUAN ) 50 MG capsule Take 50 mg by mouth daily.   gabapentin  (NEURONTIN ) 300 MG capsule Take 600 mg by mouth 2 (two) times daily.   pantoprazole  (PROTONIX ) 40 MG tablet Take 1 tablet (40 mg total) by mouth 2 (two) times daily before a meal.   pantoprazole  (PROTONIX ) 40 MG tablet Take 1 tablet (40 mg total) by mouth 2 (two) times daily.   pravastatin  (PRAVACHOL ) 20 MG tablet Take 1 tablet (20 mg total) by mouth 3 (three)  times a week.   spironolactone  (ALDACTONE ) 25 MG tablet Take 0.5 tablets (12.5 mg total) by mouth daily.   telmisartan  (MICARDIS ) 40 MG tablet Take 1 tablet (40 mg total) by mouth daily.   tizanidine  (ZANAFLEX ) 2 MG capsule Take 4 mg by mouth at bedtime.   traMADol  (ULTRAM ) 50 MG tablet Take 50 mg by mouth.   Current Facility-Administered Medications for the 04/01/24 encounter (Office Visit) with [name], MD  Medication   denosumab  (PROLIA ) injection 60 mg     Allergies:   Plaquenil  [hydroxychloroquine ], Penicillins, Sulfa antibiotics, and Sulfamethoxazole-trimethoprim   Social History   Socioeconomic History   Marital status: Widowed    Spouse name: Not on file   Number of children: 0   Years of education: Not on file   Highest education level: Bachelor's degree (e.g., BA, AB, BS)  Occupational History     Comment: RN ortho/neuro at [employer]  Tobacco Use   Smoking status: Never    Passive exposure: Never   Smokeless tobacco: Never  Vaping Use   Vaping status: Never Used  Substance and Sexual Activity   Alcohol use: Yes    Comment: social   Drug use: Never   Sexual activity: Not Currently    Birth control/protection: Post-menopausal  Other Topics Concern   Not on file  Social History Narrative   06/13/22 living with her mother   2 [place] Dew per Week   Social Drivers of Health   Financial Resource Strain: Low Risk  (03/30/2024)   Overall Financial Resource Strain (CARDIA)    Difficulty of Paying Living Expenses: Not very hard  Food Insecurity: No Food Insecurity (03/30/2024)   Hunger Vital Sign    Worried About Running Out of Food in the Last Year: Never true    Ran Out of Food in the Last Year: Never true  Transportation Needs: No Transportation Needs (03/30/2024)   PRAPARE - Administrator, Civil Service (Medical): No    Lack of Transportation (Non-Medical): No  Physical Activity: Sufficiently Active (03/30/2024)   Exercise Vital Sign    Days of Exercise per Week: 5 days    Minutes of Exercise per Session: 60 min  Stress: Stress Concern Present (03/30/2024)   [employer] of Occupational Health - Occupational Stress Questionnaire    Feeling of Stress: To some extent  Social Connections: Socially Isolated (03/30/2024)   Social Connection and Isolation Panel    Frequency of Communication with Friends and Family: More than three times a week    Frequency of Social Gatherings with Friends and Family: Twice a week    Attends Religious Services: Patient declined    Database administrator or Organizations: No    Attends Engineer, structural: Not on file    Marital Status: Widowed     Family History: The patient's family history includes Alcoholism in her father; Aneurysm in her mother; Colon cancer in her maternal grandfather; Congestive Heart Failure in her  maternal grandfather and maternal grandmother; Depression in her mother; Diabetes in her maternal grandmother; Gout in her mother; Heart block in her mother; Heart failure in her maternal grandmother; Hypertension in her maternal grandmother, mother, and paternal grandfather; Macular degeneration in her mother; Stroke in her paternal grandfather. There is no history of Esophageal cancer, Stomach cancer, or Rectal cancer.  ROS:   Please see the history of present illness.    All other systems reviewed and are negative.  EKGs/Labs/Other Studies Reviewed:    EKG:  EKG is not ordered today.   Carotid Doppler 11/2023: IMPRESSION: 1. No atherosclerotic plaque or evidence of stenosis in the right internal carotid artery. 2. Trace atherosclerotic plaque without stenosis in the left internal carotid artery. 3. Vertebral arteries are patent with normal antegrade flow.  Echo 10/03/23:  1. Left ventricular ejection fraction, by estimation, is 60 to 65%. The  left ventricle has normal function. The left ventricle has no regional  wall motion abnormalities. There is mild left ventricular hypertrophy.  Left ventricular diastolic parameters  are consistent with Grade I diastolic dysfunction (impaired relaxation).   2. Right ventricular systolic function is normal. The right ventricular  size is normal. Tricuspid regurgitation signal is inadequate for assessing  PA pressure.   3. Left atrial size was mild to moderately dilated.   4. No evidence of mitral valve regurgitation. Moderate mitral annular  calcification.   5. The aortic valve is tricuspid. Aortic valve regurgitation is not  visualized. Aortic valve sclerosis is present, with no evidence of aortic  valve stenosis.   6. The inferior vena cava is normal in size with greater than 50%  respiratory variability, suggesting right atrial pressure of 3 mmHg.   Recent Labs: 10/02/2023: B Natriuretic Peptide 69.5; TSH 1.886 10/29/2023: Hemoglobin  11.3; Platelets 295.0 01/17/2024: Magnesium 2.2 03/17/2024: ALT 17 03/24/2024: BUN 21; Creatinine, Ser 1.16; Potassium 4.9; Sodium 141   Recent Lipid Panel    Component Value Date/Time   CHOL 172 03/17/2024 0928   TRIG 59 03/17/2024 0928   HDL 96 03/17/2024 0928   CHOLHDL 1.8 03/17/2024 0928   CHOLHDL 2.2 05/08/2022 0424   VLDL 24 05/08/2022 0424   LDLCALC 64 03/17/2024 0928    Physical Exam:   VS:  BP (!) 144/62   Pulse (!) 59   Ht 5' 2 (1.575 m)   Wt 148 lb 6.4 oz (67.3 kg)   SpO2 100%   BMI 27.14 kg/m  , BMI Body mass index is 27.14 kg/m. GENERAL:  Well appearing HEENT: Pupils equal round and reactive, fundi not visualized, oral mucosa unremarkable NECK:  No jugular venous distention, waveform within normal limits, carotid upstroke brisk and symmetric, no bruits, no thyromegaly LUNGS:  Clear to auscultation bilaterally HEART:  RRR.  PMI not displaced or sustained,S1 and S2 within normal limits, no S3, no S4, no clicks, no rubs, II/VI systolic murmur at the LUSB with radiation to the R carotid ABD:  Flat, positive bowel sounds normal in frequency in pitch, no bruits, no rebound, no guarding, no midline pulsatile mass, no hepatomegaly, no splenomegaly EXT:  2 plus pulses throughout, no edema, no cyanosis no clubbing SKIN:  No rashes no nodules NEURO:  Cranial nerves II through XII grossly intact, motor grossly intact throughout PSYCH:  Cognitively intact, oriented to person place and time   ASSESSMENT/PLAN:    Assessment & Plan # Bradycardia Heart rate of 51 noted during procedure. Symptoms include lightheadedness with low blood pressure. ILR implanted.  No evidence of symptomatic bradycardia.  Avoid nodal agents.   # Hypertension Variability noted with occasional lows causing lightheadedness. Current control satisfactory given history of higher pressures. Focus on avoiding lows.  Overall BP control is much better.  - Continue antihypertensive regimen with amlodipine ,  spironolactone , and telmisartan . - Monitor blood pressure regularly. - Reassess blood pressure control in six months.  # CVA: # Hyperlipidemia:  Continue BP control as above.  Continue clopidogrel  and pravastatin .  # Esophageal stricture Recent dilation improved symptoms significantly. Residual soreness present  but differs from previous symptoms.  # Depression Increased depression after medication change. Reverted to previous regimen after psychiatrist consultation. Normal urine test for pheochromocytoma. Continuing current regimen based on psychiatrist's guidance. - Continue current antidepressant regimen as per psychiatrist's guidance.    Screening for Secondary Hypertension:     02/13/2024    9:45 AM  Causes  Drugs/Herbals Screened     - Comments some caffeine.  doesn't limit salt.  rare EtOH.  no supplements  Renovascular HTN Screened     - Comments renal Dopplers negative  Sleep Apnea Screened  Thyroid  Disease Screened  Hyperaldosteronism Screened     - Comments renin/aldo normal 2024  Pheochromocytoma Screened     - Comments check catecholamines and metanephrines  Cushing's Syndrome N/A  Hyperparathyroidism Screened  Coarctation of the Aorta Screened     - Comments BP symmetric  Compliance Screened    Relevant Labs/Studies:    Latest Ref Rng & Units 03/24/2024   10:35 AM 03/17/2024    9:34 AM 02/18/2024   10:56 AM  Basic Labs  Sodium 134 - 144 mmol/L 141  138  140   Potassium 3.5 - 5.2 mmol/L 4.9  5.5  5.3   Creatinine 0.57 - 1.00 mg/dL 1.61  0.96  0.45        Latest Ref Rng & Units 10/02/2023    8:13 PM 01/21/2023   10:59 AM  Thyroid    TSH 0.350 - 4.500 uIU/mL 1.886  2.11        Latest Ref Rng & Units 10/18/2023   11:05 AM 05/10/2022    2:52 PM  Renin/Aldosterone   Aldosterone 0.0 - 30.0 ng/dL 6.8  3   Aldos/Renin Ratio 0.0 - 30.0 3.7         Latest Ref Rng & Units 02/18/2024   10:56 AM  Metanephrines/Catecholamines   Epinephrine  0.0 - 55.4 pg/mL <10.0    Norepinephrine 115 - 524 pg/mL 1,117   Dopamine 0.0 - 36.7 pg/mL 86.5   Metanephrines 0.0 - 88.0 pg/mL <25.0   Normetanephrines  0.0 - 285.2 pg/mL 207.0           09/10/2023    2:48 PM  Renovascular   Renal Artery US  Completed Yes      Disposition:    FU with MD/PharmD in 6 months    Medication Adjustments/Labs and Tests Ordered: Current medicines are reviewed at length with the patient today.  Concerns regarding medicines are outlined above.  No orders of the defined types were placed in this encounter.  No orders of the defined types were placed in this encounter.    Signed, [name], MD  04/01/2024 2:20 PM    [facility]

## 2024-04-01 NOTE — Progress Notes (Unsigned)
 Virtual Visit via Video Note  I connected with Debbie Hatfield on 04/01/24 at  7:30 AM EDT by a video enabled telemedicine application and verified that I am speaking with the correct person using two identifiers.  Location: Patient: Home Provider: Office   I discussed the limitations of evaluation and management by telemedicine and the availability of in person appointments. The patient expressed understanding and agreed to proceed.   I discussed the assessment and treatment plan with the patient. The patient was provided an opportunity to ask questions and all were answered. The patient agreed with the plan and demonstrated an understanding of the instructions.   The patient was advised to call back or seek an in-person evaluation if the symptoms worsen or if the condition fails to improve as anticipated.  I provided 18 minutes of non-face-to-face time during this encounter.   Debbie Reagin, NP   [facility] MD/PA/NP OP Progress Note  04/01/2024 4:58 PM Debbie Hatfield  MRN:  [id number]  Chief Complaint: No chief complaint on file.  HPI: Debbie Hatfield 68 year old Caucasian female presents for medication management follow-up appointment.  Seen and evaluated via caregility she carries a diagnosis related to generalized anxiety disorder, major depressive disorder and sleep disturbance.  She reports  a lot has happened since I last seen you.  Reports she recently had removal of a  squamous cell /moles on her skin.  Reports she recently had her esophagus stretched.   Per previous office visit: Was discussed discontinuation of doxepin  and Effexor  due to elevated catecholamine levels.  Patient was to start amitriptyline  25 to 50 mg for mood stabilization.  Patient reports  I told the office that I cannot tolerate the new medication and I wanted to go back on my Effexor .  Patient reports she left the letter regarding her concerns.  Will follow-up with front office.  States she has been taking Effexor   for roughly 1 month.   Visit Diagnosis:    ICD-10-CM   1. Severe episode of recurrent major depressive disorder, without psychotic features (HCC)  F33.2     2. Sleep disturbance  G47.9       Past Psychiatric History:   Past Medical History:  Past Medical History:  Diagnosis Date  . AKI (acute kidney injury) (HCC) 10/03/2023  . Cancer (HCC) 03/30/2024   sqamous cell ca lip and back  . Closed fracture of right distal radius   . Degeneration of lumbar intervertebral disc 01/26/2020  . Depression with anxiety 10/03/2023  . GAD (generalized anxiety disorder) 11/15/2021  . Gastroparesis   . GERD (gastroesophageal reflux disease)   . History of CVA (cerebrovascular accident) 10/03/2023  . Hyperlipidemia LDL goal <100 03/26/2022   The ASCVD Risk score (Arnett DK, et al., 2019) failed to calculate for the following reasons:    The valid HDL cholesterol range is 20 to 100 mg/dL    . Hypertension   . Hypotension 10/03/2023  . Irritable bowel disease   . Lupus   . Major depression   . Morton's neuroma of left foot 08/30/2023  . Near syncope 10/03/2023  . Need for shingles vaccine 11/15/2021  . Need for vaccination 11/15/2021  . Nonrheumatic mitral valve regurgitation 05/10/2022  . Osteopenia 12/14/2015  . Peptic ulcer disease 12/14/2015  . Recurrent major depressive disorder, in partial remission (HCC) 11/15/2021  . Sinus bradycardia 10/03/2023  . Stage 3a chronic kidney disease (HCC) 11/15/2021   Estimated Creatinine Clearance: 49.7 mL/min (by C-G formula based on SCr  of 1.01 mg/dL).      Estimated Creatinine Clearance: 45.9 mL/min (A) (by C-G formula based on SCr of 1.07 mg/dL (H)).     . 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  . Stroke (HCC)   . Trigeminal neuralgia   . Visit for screening mammogram 11/15/2021    Past Surgical History:  Procedure Laterality Date  . BUBBLE STUDY  07/05/2022   Procedure: BUBBLE STUDY;  Surgeon: Euell Herrlich, MD;  Location: [facility] ENDOSCOPY;   Service: Cardiology;;  . CARPAL TUNNEL RELEASE Left 05/15/2023  . CHOLECYSTECTOMY    . COLONOSCOPY    . LOOP RECORDER IMPLANT  2021  . ORIF WRIST FRACTURE Right 09/22/2020   Procedure: OPEN REDUCTION INTERNAL FIXATION (ORIF) WRIST FRACTURE;  Surgeon: Oralia Bills, MD;  Location: [facility];  Service: Orthopedics;  Laterality: Right;   . TEE WITHOUT CARDIOVERSION N/A 07/05/2022   Procedure: TRANSESOPHAGEAL ECHOCARDIOGRAM (TEE);  Surgeon: Euell Herrlich, MD;  Location: [facility] ENDOSCOPY;  Service: Cardiology;  Laterality: N/A;  . UPPER GASTROINTESTINAL ENDOSCOPY      Family Psychiatric History:   Family History:  Family History  Problem Relation Age of Onset  . Heart block Mother        Pacemaker  . Hypertension Mother   . Depression Mother   . Gout Mother   . Macular degeneration Mother   . Aneurysm Mother   . Alcoholism Father   . Heart failure Maternal Grandmother   . Hypertension Maternal Grandmother   . Congestive Heart Failure Maternal Grandmother   . Diabetes Maternal Grandmother   . Congestive Heart Failure Maternal Grandfather   . Colon cancer Maternal Grandfather   . Stroke Paternal Grandfather   . Hypertension Paternal Grandfather   . Esophageal cancer Neg Hx   . Stomach cancer Neg Hx   . Rectal cancer Neg Hx     Social History:  Social History   Socioeconomic History  . Marital status: Widowed    Spouse name: Not on file  . Number of children: 0  . Years of education: Not on file  . Highest education level: Bachelor's degree (e.g., BA, AB, BS)  Occupational History    Comment: RN ortho/neuro at [employer]  Tobacco Use  . Smoking status: Never    Passive exposure: Never  . Smokeless tobacco: Never  Vaping Use  . Vaping status: Never Used  Substance and Sexual Activity  . Alcohol use: Yes    Comment: social  . Drug use: Never  . Sexual activity: Not Currently    Birth control/protection: Post-menopausal  Other Topics Concern   . Not on file  Social History Narrative   06/13/22 living with her mother   2 [place] Dew per Week   Social Drivers of Health   Financial Resource Strain: Low Risk  (03/30/2024)   Overall Financial Resource Strain (CARDIA)   . Difficulty of Paying Living Expenses: Not very hard  Food Insecurity: No Food Insecurity (03/30/2024)   Hunger Vital Sign   . Worried About Programme researcher, broadcasting/film/video in the Last Year: Never true   . Ran Out of Food in the Last Year: Never true  Transportation Needs: No Transportation Needs (03/30/2024)   PRAPARE - Transportation   . Lack of Transportation (Medical): No   . Lack of Transportation (Non-Medical): No  Physical Activity: Sufficiently Active (03/30/2024)   Exercise Vital Sign   . Days of Exercise per Week: 5 days   . Minutes of Exercise per Session:  60 min  Stress: Stress Concern Present (03/30/2024)   [employer] of Occupational Health - Occupational Stress Questionnaire   . Feeling of Stress: To some extent  Social Connections: Socially Isolated (03/30/2024)   Social Connection and Isolation Panel   . Frequency of Communication with Friends and Family: More than three times a week   . Frequency of Social Gatherings with Friends and Family: Twice a week   . Attends Religious Services: Patient declined   . Active Member of Clubs or Organizations: No   . Attends Banker Meetings: Not on file   . Marital Status: Widowed    Allergies:  Allergies  Allergen Reactions  . Plaquenil  [Hydroxychloroquine ] Other (See Comments)    Retinal damage  . Penicillins Rash  . Sulfa Antibiotics Nausea And Vomiting  . Sulfamethoxazole-Trimethoprim Nausea And Vomiting    Metabolic Disorder Labs: Lab Results  Component Value Date   HGBA1C 5.7 (H) 05/08/2022   MPG 116.89 05/08/2022   No results found for: PROLACTIN Lab Results  Component Value Date   CHOL 172 03/17/2024   TRIG 59 03/17/2024   HDL 96 03/17/2024   CHOLHDL 1.8 03/17/2024    VLDL 24 05/08/2022   LDLCALC 64 03/17/2024   LDLCALC 97 02/18/2024   Lab Results  Component Value Date   TSH 1.886 10/02/2023   TSH 2.11 01/21/2023    Therapeutic Level Labs: No results found for: LITHIUM No results found for: VALPROATE Lab Results  Component Value Date   CBMZ <2.0 (L) 07/29/2023    Current Medications: Current Outpatient Medications  Medication Sig Dispense Refill  . doxepin  (SINEQUAN ) 50 MG capsule Take 1 capsule (50 mg total) by mouth at bedtime. 30 capsule 2  . venlafaxine  XR (EFFEXOR -XR) 37.5 MG 24 hr capsule Take 1 capsule (37.5 mg total) by mouth daily with breakfast. 60 capsule 0  . ACETAMINOPHEN  PO Take 1,300 mg by mouth in the morning and at bedtime.    . acyclovir  (ZOVIRAX ) 400 MG tablet Take 1 tablet (400 mg total) by mouth daily. 90 tablet 1  . amLODipine  (NORVASC ) 2.5 MG tablet Take 1 tablet (2.5 mg total) by mouth at bedtime. CONTINUE 5 MG TABLETS IN MORNING (Patient taking differently: Take 2.5 mg by mouth 2 (two) times daily.) 90 tablet 3  . baclofen  (LIORESAL ) 10 MG tablet Take 10 mg by mouth as needed. (Patient not taking: Reported on 04/01/2024)    . Cholecalciferol 125 MCG (5000 UT) capsule Take 5,000 Units by mouth daily.    . clonazePAM  (KLONOPIN ) 0.5 MG tablet Take 2 tablets (1 mg total) by mouth at bedtime as needed for anxiety. (Patient not taking: Reported on 04/01/2024) 30 tablet 5  . clopidogrel  (PLAVIX ) 75 MG tablet One po qd 90 tablet 4  . doxepin  (SINEQUAN ) 50 MG capsule Take 50 mg by mouth daily.    . gabapentin  (NEURONTIN ) 300 MG capsule Take 600 mg by mouth 2 (two) times daily.    . pantoprazole  (PROTONIX ) 40 MG tablet Take 1 tablet (40 mg total) by mouth 2 (two) times daily before a meal. 60 tablet 12  . pantoprazole  (PROTONIX ) 40 MG tablet Take 1 tablet (40 mg total) by mouth 2 (two) times daily. 30 tablet 13  . pravastatin  (PRAVACHOL ) 20 MG tablet Take 1 tablet (20 mg total) by mouth 3 (three) times a week. 36 tablet 0  .  spironolactone  (ALDACTONE ) 25 MG tablet Take 0.5 tablets (12.5 mg total) by mouth daily. 45 tablet 3  . telmisartan  (  MICARDIS ) 40 MG tablet Take 1 tablet (40 mg total) by mouth daily. 30 tablet 3  . tizanidine  (ZANAFLEX ) 2 MG capsule Take 4 mg by mouth at bedtime.    . traMADol  (ULTRAM ) 50 MG tablet Take 50 mg by mouth.     Current Facility-Administered Medications  Medication Dose Route Frequency Provider Last Rate Last Admin  . denosumab  (PROLIA ) injection 60 mg  60 mg Subcutaneous Once          Musculoskeletal: Strength & Muscle Tone: {desc; muscle tone:32375} Gait & Station: {PE GAIT ED ZOXW:96045} Patient leans: {Patient Leans:21022755}  Psychiatric Specialty Exam: Review of Systems  There were no vitals taken for this visit.There is no height or weight on file to calculate BMI.  General Appearance: {Appearance:22683}  Eye Contact:  {BHH EYE CONTACT:22684}  Speech:  {Speech:22685}  Volume:  {Volume (PAA):22686}  Mood:  {BHH MOOD:22306}  Affect:  {Affect (PAA):22687}  Thought Process:  {Thought Process (PAA):22688}  Orientation:  {BHH ORIENTATION (PAA):22689}  Thought Content: {Thought Content:22690}   Suicidal Thoughts:  {ST/HT (PAA):22692}  Homicidal Thoughts:  {ST/HT (PAA):22692}  Memory:  {BHH MEMORY:22881}  Judgement:  {Judgement (PAA):22694}  Insight:  {Insight (PAA):22695}  Psychomotor Activity:  {Psychomotor (PAA):22696}  Concentration:  {Concentration:21399}  Recall:  {BHH GOOD/FAIR/POOR:22877}  Fund of Knowledge: {BHH GOOD/FAIR/POOR:22877}  Language: {BHH GOOD/FAIR/POOR:22877}  Akathisia:  {BHH YES OR NO:22294}  Handed:  {Handed:22697}  AIMS (if indicated): {Desc; done/not:10129}  Assets:  {Assets (PAA):22698}  ADL's:  {BHH WUJ'W:11914}  Cognition: {chl bhh cognition:304700322}  Sleep:  {BHH GOOD/FAIR/POOR:22877}   Screenings: GAD-7    Advertising copywriter from 11/26/2023 in [facility] at [facility] Visit from  11/15/2021 in [facility]  Total GAD-7 Score 17 2   PHQ2-9    Flowsheet Row Counselor from 11/26/2023 in [facility] Visit from 10/29/2023 in [facility] [place] [facility] Visit from 08/28/2023 in [facility] Clinical Support from 10/24/2022 in [facility] [place] [facility] Visit from 10/22/2022 in [facility] [place]  PHQ-2 Total Score 5 0 5 0 0  PHQ-9 Total Score 19 -- 12 8 8    Flowsheet Row Counselor from 11/26/2023 in [facility] at [facility] ED to Hosp-Admission (Discharged) from 10/02/2023 in [facility] ED to Hosp-Admission (Discharged) from 05/07/2022 in [facility] NEURO/TRAUMA/SURGICAL ICU  C-SSRS RISK CATEGORY No Risk No Risk No Risk     Assessment and Plan: ***  Collaboration of Care: Collaboration of Care: [facility] OP Collaboration of NWGN:[id number]}  Patient/Guardian was advised Release of Information must be obtained prior to any record release in order to collaborate their care with an outside provider. Patient/Guardian was advised if they have not already done so to contact the registration department to sign all necessary forms in order for us  to release information regarding their care.   Consent: Patient/Guardian gives verbal consent for treatment and assignment of benefits for services provided during this visit. Patient/Guardian expressed understanding and agreed to proceed.    Debbie Reagin, NP 04/01/2024, 4:58 PM

## 2024-04-01 NOTE — Patient Instructions (Addendum)
 Medication Instructions:  Your physician recommends that you continue on your current medications as directed. Please refer to the Current Medication list given to you today.   Labwork: A1C WITH YOUR PRIMARY CARE   Testing/Procedures: NONE  Follow-Up: 6 MONTHS WITH DR Crowheart, CAITLIN W NP, OR MICHELLE S NP   Any Other Special Instructions Will Be Listed Below (If Applicable).

## 2024-04-01 NOTE — Telephone Encounter (Signed)
  Follow up Call-     03/31/2024    2:57 PM 07/09/2023    2:39 PM  Call back number  Post procedure Call Back phone  # [phone]  Permission to leave phone message Yes Yes     Patient questions:  Do you have a fever, pain , or abdominal swelling? No. Pain Score  0 *  Have you tolerated food without any problems? Yes.    Have you been able to return to your normal activities? Yes.    Do you have any questions about your discharge instructions: Diet   No. Medications  No. Follow up visit  No.  Do you have questions or concerns about your Care? No.  Actions: * If pain score is 4 or above: No action needed, pain <4.

## 2024-04-02 ENCOUNTER — Other Ambulatory Visit: Payer: Self-pay

## 2024-04-02 ENCOUNTER — Ambulatory Visit: Admitting: Internal Medicine

## 2024-04-02 ENCOUNTER — Encounter (INDEPENDENT_AMBULATORY_CARE_PROVIDER_SITE_OTHER): Payer: Self-pay | Admitting: Cardiology

## 2024-04-02 ENCOUNTER — Ambulatory Visit (INDEPENDENT_AMBULATORY_CARE_PROVIDER_SITE_OTHER)

## 2024-04-02 ENCOUNTER — Encounter: Payer: Self-pay | Admitting: Gastroenterology

## 2024-04-02 ENCOUNTER — Telehealth: Payer: Self-pay | Admitting: *Deleted

## 2024-04-02 DIAGNOSIS — G4719 Other hypersomnia: Secondary | ICD-10-CM

## 2024-04-02 DIAGNOSIS — M810 Age-related osteoporosis without current pathological fracture: Secondary | ICD-10-CM | POA: Diagnosis not present

## 2024-04-02 MED ORDER — DENOSUMAB 60 MG/ML ~~LOC~~ SOSY
60.0000 mg | PREFILLED_SYRINGE | Freq: Once | SUBCUTANEOUS | Status: AC
  Administered 2024-10-09: 60 mg via SUBCUTANEOUS

## 2024-04-02 MED ORDER — PANTOPRAZOLE SODIUM 40 MG PO TBEC: 40.0000 mg | DELAYED_RELEASE_TABLET | Freq: Two times a day (BID) | ORAL | 3 refills | Status: AC

## 2024-04-02 NOTE — Telephone Encounter (Signed)
 Ordering provider: DR [facility] Associated diagnoses: R40.0, R06.83 WatchPAT PA obtained on 04/02/2024 by Gaylene Kays, CMA. Authorization: No; tracking ID NO AUTH REQ  Patient notified of PIN (1234) on 04/02/2024 via Notification Method: phone.

## 2024-04-02 NOTE — Progress Notes (Signed)
 Patient visits today for their prolia injection. Patient informed of what they had received and tolerated injection well. Patient notified to reach out to office if needed.

## 2024-04-02 NOTE — Telephone Encounter (Signed)
-----   Message from Nurse Ernestine Heads sent at 04/01/2024  2:14 PM EDT ----- Hello all,   Patient has Debbie Hatfield but never received code  Thanks  [place]

## 2024-04-02 NOTE — Progress Notes (Unsigned)
 Subjective:  Patient ID: Debbie Hatfield, female    DOB: 13-Feb-1956  Age: 68 y.o. MRN: [id number]  CC: No chief complaint on file.   HPI Debbie Hatfield presents for ***  Outpatient Medications Prior to Visit  Medication Sig Dispense Refill   ACETAMINOPHEN  PO Take 1,300 mg by mouth in the morning and at bedtime.     acyclovir  (ZOVIRAX ) 400 MG tablet Take 1 tablet (400 mg total) by mouth daily. 90 tablet 1   amLODipine  (NORVASC ) 2.5 MG tablet Take 1 tablet (2.5 mg total) by mouth at bedtime. CONTINUE 5 MG TABLETS IN MORNING (Patient taking differently: Take 2.5 mg by mouth 2 (two) times daily.) 90 tablet 3   baclofen  (LIORESAL ) 10 MG tablet Take 10 mg by mouth as needed. (Patient not taking: Reported on 04/01/2024)     Cholecalciferol 125 MCG (5000 UT) capsule Take 5,000 Units by mouth daily.     clonazePAM  (KLONOPIN ) 0.5 MG tablet Take 2 tablets (1 mg total) by mouth at bedtime as needed for anxiety. (Patient not taking: Reported on 04/01/2024) 30 tablet 5   clopidogrel  (PLAVIX ) 75 MG tablet One po qd 90 tablet 4   doxepin  (SINEQUAN ) 50 MG capsule Take 50 mg by mouth daily.     doxepin  (SINEQUAN ) 50 MG capsule Take 1 capsule (50 mg total) by mouth at bedtime. 30 capsule 2   gabapentin  (NEURONTIN ) 300 MG capsule Take 600 mg by mouth 2 (two) times daily.     pantoprazole  (PROTONIX ) 40 MG tablet Take 1 tablet (40 mg total) by mouth 2 (two) times daily before a meal. 180 tablet 3   pravastatin  (PRAVACHOL ) 20 MG tablet Take 1 tablet (20 mg total) by mouth 3 (three) times a week. 36 tablet 0   spironolactone  (ALDACTONE ) 25 MG tablet Take 0.5 tablets (12.5 mg total) by mouth daily. 45 tablet 3   telmisartan  (MICARDIS ) 40 MG tablet Take 1 tablet (40 mg total) by mouth daily. 30 tablet 3   tizanidine  (ZANAFLEX ) 2 MG capsule Take 4 mg by mouth at bedtime.     traMADol  (ULTRAM ) 50 MG tablet Take 50 mg by mouth.     venlafaxine  XR (EFFEXOR -XR) 37.5 MG 24 hr capsule Take 1 capsule (37.5 mg total) by mouth  daily with breakfast. 60 capsule 0   Facility-Administered Medications Prior to Visit  Medication Dose Route Frequency Provider Last Rate Last Admin   denosumab  (PROLIA ) injection 60 mg  60 mg Subcutaneous Once         ROS Review of Systems  Objective:  There were no vitals taken for this visit.  BP Readings from Last 3 Encounters:  04/01/24 (!) 144/62  03/31/24 (!) 154/60  03/30/24 127/60    Wt Readings from Last 3 Encounters:  04/01/24 148 lb 6.4 oz (67.3 kg)  03/31/24 149 lb (67.6 kg)  03/24/24 149 lb 3.2 oz (67.7 kg)    Physical Exam  Lab Results  Component Value Date   WBC 5.2 10/29/2023   HGB 11.3 (L) 10/29/2023   HCT 36.2 10/29/2023   PLT 295.0 10/29/2023   GLUCOSE 73 03/24/2024   CHOL 172 03/17/2024   TRIG 59 03/17/2024   HDL 96 03/17/2024   LDLCALC 64 03/17/2024   ALT 17 03/17/2024   AST 25 03/17/2024   NA 141 03/24/2024   K 4.9 03/24/2024   CL 105 03/24/2024   CREATININE 1.16 (H) 03/24/2024   BUN 21 03/24/2024   CO2 19 (L) 03/24/2024   TSH  1.886 10/02/2023   INR 1.1 (H) 07/10/2023   HGBA1C 5.7 (H) 05/08/2022    DG BONE DENSITY (DXA) Result Date: 03/10/2024 EXAM: DUAL X-RAY ABSORPTIOMETRY (DXA) FOR BONE MINERAL DENSITY 03/06/2024 2:39 pm CLINICAL DATA:  67 year old Female Postmenopausal. Screening for osteoporosis History of fragility fracture. TECHNIQUE: An axial (e.g., hips, spine) and/or appendicular (e.g., radius) exam was performed, as appropriate, using GE Secretary/administrator at [employer] [facility]. Images are obtained for bone mineral density measurement and are not obtained for diagnostic purposes. [id number] Exclusions: L3 due to degenerative changes COMPARISON:  None. FINDINGS: Scan quality: Good. LUMBAR SPINE (L1-L2, L4): BMD (in g/cm2): 1.121 T-score: -0.4 Z-score: 1.2 LEFT FEMORAL NECK: BMD (in g/cm2): 0.673 T-score: -2.6 Z-score: -1.1 LEFT TOTAL HIP: BMD (in g/cm2): 0.841 T-score: -1.3 Z-score: 0.0 RIGHT FEMORAL  NECK: BMD (in g/cm2): 0.679 T-score: -2.6 Z-score: -1.0 RIGHT TOTAL HIP: BMD (in g/cm2): 0.798 T-score: -1.7 Z-score: -0.3 FRAX 10-YEAR PROBABILITY OF FRACTURE: FRAX not reported as the lowest BMD is not in the osteopenia range. IMPRESSION: Osteoporosis based on BMD. Fracture risk is increased. Increased risk is based on low BMD, and history of fragility fracture. RECOMMENDATIONS: 1. All patients should optimize calcium  and vitamin D intake. 2. Consider FDA-approved medical therapies in postmenopausal women and men aged 64 years and older, based on the following: - A hip or vertebral (clinical or morphometric) fracture - T-score less than or equal to -2.5 and secondary causes have been excluded. - Low bone mass (T-score between -1.0 and -2.5) and a 10-year probability of a hip fracture greater than or equal to 3% or a 10-year probability of a major osteoporosis-related fracture greater than or equal to 20% based on the [place] -adapted WHO algorithm. - Clinician judgment and/or patient preferences may indicate treatment for people with 10-year fracture probabilities above or below these levels 3. Patients with diagnosis of osteoporosis or at high risk for fracture should have regular bone mineral density tests. For patients eligible for Medicare, routine testing is allowed once every 2 years. The testing frequency can be increased to one year for patients who have rapidly progressing disease, those who are receiving or discontinuing medical therapy to restore bone mass, or have additional risk factors. Electronically Signed   By: Rinda Cheers M.D.   On: 03/10/2024 15:52    Assessment & Plan:  There are no diagnoses linked to this encounter.   Follow-up: No follow-ups on file.  Sandra Crouch, MD

## 2024-04-03 ENCOUNTER — Ambulatory Visit: Payer: Self-pay | Admitting: Gastroenterology

## 2024-04-03 LAB — SURGICAL PATHOLOGY

## 2024-04-06 ENCOUNTER — Telehealth: Payer: Self-pay

## 2024-04-06 NOTE — Telephone Encounter (Signed)
 4 month recall has been entered

## 2024-04-06 NOTE — Telephone Encounter (Signed)
 Debbie Hatfield, please set up clinic visit follow-up with APP or myself in 3 to 5 months.

## 2024-04-07 ENCOUNTER — Ambulatory Visit: Attending: Cardiovascular Disease

## 2024-04-07 DIAGNOSIS — R0683 Snoring: Secondary | ICD-10-CM

## 2024-04-07 DIAGNOSIS — R400 Somnolence: Secondary | ICD-10-CM

## 2024-04-07 DIAGNOSIS — I10 Essential (primary) hypertension: Secondary | ICD-10-CM

## 2024-04-07 NOTE — Procedures (Signed)
   SLEEP STUDY REPORT Patient Information Study Date: 04/02/2024 Patient Name: Debbie Hatfield Patient ID: [id number] Birth Date: [birth year] Age: 68 Gender: Female BMI: 27.6 (W=150 lb, H=5' 2'') Referring Physician: Annabella Scarce, MD  TEST DESCRIPTION: Home sleep apnea testing was completed using the WatchPat, a Type 1 device, utilizing  peripheral arterial tonometry (PAT), chest movement, actigraphy, pulse oximetry, pulse rate, body position and snore.  AHI was calculated with apnea and hypopnea using valid sleep time as the denominator. RDI includes apneas,  hypopneas, and RERAs. The data acquired and the scoring of sleep and all associated events were performed in  accordance with the recommended standards and specifications as outlined in the AASM Manual for the Scoring of  Sleep and Associated Events 2.2.0 (2015).   FINDINGS: 1. No evidence of Obstructive Sleep Apnea with AHI 3/hr.  2. No Central Sleep Apnea. 3. Oxygen desaturations as low as 88%. 4. Severe snoring was present. O2 sats were < 88% for 0 minutes. 5. Total sleep time was 8 hrs and 41 min. 6. 18.5 % of total sleep time was spent in REM sleep.  7. Normal sleep onset latency at 23 min.  8. Prolonged REM sleep onset latency at 306 min.  9. Total awakenings were 7.   DIAGNOSIS:  Normal study with no significant sleep disordered breathing.  RECOMMENDATIONS: 1. Normal study with no significant sleep disordered breathing. 2. Healthy sleep recommendations include: adequate nightly sleep (normal 7-9 hrs/night), avoidance of caffeine after  noon and alcohol near bedtime, and maintaining a sleep environment that is cool, dark and quiet. 3. Weight loss for overweight patients is recommended.  4. Snoring recommendations include: weight loss where appropriate, side sleeping, and avoidance of alcohol before  bed. 5. Operation of motor vehicle or dangerous equipment must be avoided when feeling drowsy, excessively sleepy, or   mentally fatigued.  6. An ENT consultation which may be useful for specific causes of and possible treatment of bothersome snoring .  7. Weight loss may be of benefit in reducing the severity of snoring.   Signature: Wilbert Bihari, MD; [facility]; Diplomat, American Board of Sleep  Medicine Electronically Signed: 04/07/2024 9:04:59 AM

## 2024-04-13 ENCOUNTER — Ambulatory Visit

## 2024-04-13 ENCOUNTER — Ambulatory Visit: Payer: Self-pay | Admitting: Cardiology

## 2024-04-13 DIAGNOSIS — R55 Syncope and collapse: Secondary | ICD-10-CM | POA: Diagnosis not present

## 2024-04-13 LAB — CUP PACEART REMOTE DEVICE CHECK
Date Time Interrogation Session: 20250629232050
Implantable Pulse Generator Implant Date: 20220728

## 2024-04-15 NOTE — Progress Notes (Signed)
 Office Visit Note  Patient: Debbie Hatfield             Date of Birth: November 08, 1955           MRN: [id number]             PCP: Joshua Debby CROME, MD Referring: Joshua Debby CROME, MD Visit Date: 04/29/2024 Occupation: @GUAROCC @  Subjective:  fatigue  History of Present Illness: Debbie Hatfield is a 68 y.o. female with history of systemic lupus, osteoarthritis and chronic kidney disease.  She returns today after her last visit in February 2025.  She denies any history of joint inflammation or joint swelling.  There is no history of oral ulcers, nasal ulcers, malar rash, photosensitivity, Raynaud's, inflammatory arthritis or lymphadenopathy.  She has been off immunosuppressive agents for a long time.  She had intolerance to Imuran  in the past.  She states she was discharged yesterday after hospitalization for depression for 8 days.  She states she was switched from Effexor  to Cymbalta .  She states that she has noted improvement in the joint pain on Cymbalta .  She states she has been taking Cymbalta  30 mg in the morning and 60 mg at bedtime.  She has not been sleeping at night.  Which is making her tired.  She remains on Plavix  for the history of stroke.  She had bilateral trochanteric bursa injection at [facility] for trochanteric bursitis.    Activities of Daily Living:  Patient reports morning stiffness for 15 minutes.   Patient Denies nocturnal pain.  Difficulty dressing/grooming: Denies Difficulty climbing stairs: Denies Difficulty getting out of chair: Denies Difficulty using hands for taps, buttons, cutlery, and/or writing: Reports  Review of Systems  Constitutional:  Negative for fatigue.  HENT:  Negative for mouth sores and mouth dryness.   Eyes:  Negative for dryness.  Respiratory:  Negative for shortness of breath.   Cardiovascular:  Negative for chest pain and palpitations.  Gastrointestinal:  Negative for blood in stool, constipation and diarrhea.  Endocrine: Negative for increased  urination.  Genitourinary:  Negative for involuntary urination.  Musculoskeletal:  Positive for morning stiffness. Negative for joint pain, gait problem, joint pain, joint swelling, myalgias, muscle weakness, muscle tenderness and myalgias.  Skin:  Negative for color change, rash, hair loss and sensitivity to sunlight.  Allergic/Immunologic: Negative for susceptible to infections.  Neurological:  Negative for dizziness and headaches.  Hematological:  Negative for swollen glands.  Psychiatric/Behavioral:  Positive for depressed mood and sleep disturbance. The patient is nervous/anxious.     PMFS History:  Patient Active Problem List   Diagnosis Date Noted   Major depressive disorder with current active episode 04/21/2024   Age-related osteoporosis without current pathological fracture 03/16/2024   Deficiency anemia 10/29/2023   Depression with anxiety 10/03/2023   Sinus bradycardia 10/03/2023   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   Hyperlipidemia LDL goal <100 03/26/2022   Recurrent major depressive disorder, in partial remission (HCC) 11/15/2021   GAD (generalized anxiety disorder) 11/15/2021   Stage 3a chronic kidney disease (HCC) 11/15/2021   Trigeminal neuralgia    Lupus    Irritable bowel disease    GERD (gastroesophageal reflux disease)    Hypertension    Gastroparesis    Degeneration of lumbar intervertebral disc 01/26/2020   Peptic ulcer disease 12/14/2015    Past Medical History:  Diagnosis Date   AKI (acute kidney injury) (HCC) 10/03/2023   Cancer (HCC) 03/30/2024   sqamous cell ca lip and back   Closed  fracture of right distal radius    Degeneration of lumbar intervertebral disc 01/26/2020   Depression with anxiety 10/03/2023   GAD (generalized anxiety disorder) 11/15/2021   Gastroparesis    GERD (gastroesophageal reflux disease)    History of CVA (cerebrovascular accident) 10/03/2023   Hyperlipidemia LDL goal <100 03/26/2022   The ASCVD Risk score (Arnett DK, et  al., 2019) failed to calculate for the following reasons:    The valid HDL cholesterol range is 20 to 100 mg/dL     Hypertension    Hypotension 10/03/2023   Irritable bowel disease    Lupus    Major depression    Morton's neuroma of left foot 08/30/2023   Near syncope 10/03/2023   Need for shingles vaccine 11/15/2021   Need for vaccination 11/15/2021   Nonrheumatic mitral valve regurgitation 05/10/2022   Osteopenia 12/14/2015   Peptic ulcer disease 12/14/2015   Recurrent major depressive disorder, in partial remission (HCC) 11/15/2021   Sinus bradycardia 10/03/2023   Stage 3a chronic kidney disease (HCC) 11/15/2021   Estimated Creatinine Clearance: 49.7 mL/min (by C-G formula based on SCr of 1.01 mg/dL).      Estimated Creatinine Clearance: 45.9 mL/min (A) (by C-G formula based on SCr of 1.07 mg/dL (H)).      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   Stroke [facility]    Trigeminal neuralgia    Visit for screening mammogram 11/15/2021    Family History  Problem Relation Age of Onset   Heart block Mother        Pacemaker   Hypertension Mother    Depression Mother    Gout Mother    Macular degeneration Mother    Aneurysm Mother    Alcoholism Father    Heart failure Maternal Grandmother    Hypertension Maternal Grandmother    Congestive Heart Failure Maternal Grandmother    Diabetes Maternal Grandmother    Congestive Heart Failure Maternal Grandfather    Colon cancer Maternal Grandfather    Stroke Paternal Grandfather    Hypertension Paternal Grandfather    Esophageal cancer Neg Hx    Stomach cancer Neg Hx    Rectal cancer Neg Hx    Past Surgical History:  Procedure Laterality Date   BUBBLE STUDY  07/05/2022   Procedure: BUBBLE STUDY;  Surgeon: Loni Soyla LABOR, MD;  Location: [facility] ENDOSCOPY;  Service: Cardiology;;   CARPAL TUNNEL RELEASE Left 05/15/2023   CHOLECYSTECTOMY     COLONOSCOPY     LOOP RECORDER IMPLANT  2021   ORIF WRIST FRACTURE Right 09/22/2020   Procedure: OPEN  REDUCTION INTERNAL FIXATION (ORIF) WRIST FRACTURE;  Surgeon: Carolee Lynwood JINNY DOUGLAS, MD;  Location: [facility];  Service: Orthopedics;  Laterality: Right;    TEE WITHOUT CARDIOVERSION N/A 07/05/2022   Procedure: TRANSESOPHAGEAL ECHOCARDIOGRAM (TEE);  Surgeon: Loni Soyla LABOR, MD;  Location: [facility] ENDOSCOPY;  Service: Cardiology;  Laterality: N/A;   UPPER GASTROINTESTINAL ENDOSCOPY     Social History   Social History Narrative   06/13/22 living with her mother   2 [employer] per [employer] History  Administered Date(s) Administered   Influenza,inj,Quad PF,6-35 Mos 06/17/2019   Influenza-Unspecified 09/02/2020, 07/27/2021, 07/15/2022   Moderna SARS-COV2 Booster Vaccination 09/02/2020   Moderna Sars-Covid-2 Vaccination 10/23/2019, 11/20/2019   PNEUMOCOCCAL CONJUGATE-20 11/15/2021   Tdap 03/28/2014, 12/29/2020     Objective: Vital Signs: BP (!) 147/64 (BP Location: Left Arm, Patient Position: Sitting, Cuff Size: Normal)   Pulse 81   Resp 15  Ht 5' 2 (1.575 m)   Wt 148 lb 9.6 oz (67.4 kg)   BMI 27.18 kg/m    Physical Exam Vitals and nursing note reviewed.  Constitutional:      Appearance: She is well-developed.  HENT:     Head: Normocephalic and atraumatic.  Eyes:     Conjunctiva/sclera: Conjunctivae normal.  Cardiovascular:     Rate and Rhythm: Normal rate and regular rhythm.     Heart sounds: Normal heart sounds.  Pulmonary:     Effort: Pulmonary effort is normal.     Breath sounds: Normal breath sounds.  Abdominal:     General: Bowel sounds are normal.     Palpations: Abdomen is soft.  Musculoskeletal:     Cervical back: Normal range of motion.  Lymphadenopathy:     Cervical: No cervical adenopathy.  Skin:    General: Skin is warm and dry.     Capillary Refill: Capillary refill takes less than 2 seconds.  Neurological:     Mental Status: She is alert and oriented to person, place, and time.  Psychiatric:        Behavior: Behavior  normal.      Musculoskeletal Exam: Patient has good range of motion of the cervical spine.  There was no tenderness over thoracic or lumbar spine.  Shoulders, elbows, wrist, MCPs PIPs and DIPs Juengel range of motion.  She had bilateral PIP and DIP thickening with no synovitis.  Hip joints and knee joints have good range of motion without any warmth swelling or effusion.  There was no tenderness over her ankles or MTPs.  CDAI Exam: CDAI Score: -- Patient Global: --; Provider Global: -- Swollen: --; Tender: -- Joint Exam 04/29/2024   No joint exam has been documented for this visit   There is currently no information documented on the homunculus. Go to the Rheumatology activity and complete the homunculus joint exam.  Investigation: No additional findings.  Imaging: CUP PACEART REMOTE DEVICE CHECK Result Date: 04/13/2024 ILR summary report received. Battery status OK. Normal device function. No new symptom, tachy, brady, or pause episodes. No new AF episodes. Monthly summary reports and ROV/PRN. MC, CVRS   Recent Labs: Lab Results  Component Value Date   WBC 12.0 (H) 04/20/2024   HGB 12.2 04/20/2024   PLT 407 (H) 04/20/2024   NA 140 04/20/2024   K 4.5 04/20/2024   CL 110 04/20/2024   CO2 23 04/20/2024   GLUCOSE 144 (H) 04/20/2024   BUN 24 (H) 04/20/2024   CREATININE 1.29 (H) 04/20/2024   BILITOT 0.4 03/17/2024   ALKPHOS 127 (H) 03/17/2024   AST 25 03/17/2024   ALT 17 03/17/2024   PROT 7.7 03/17/2024   ALBUMIN 5.0 (H) 03/17/2024   CALCIUM  9.4 04/20/2024   GFRAA >90 07/07/2012   QFTBGOLDPLUS NEGATIVE 06/22/2022    Speciality Comments: Ocular toxicity from Plaquenil  use MTX 6 tablets p.o. weekly caused muscle pain Imuran -myalgias  Procedures:  No procedures performed Allergies: Plaquenil  [hydroxychloroquine ], Penicillins, Sulfa antibiotics, and Sulfamethoxazole-trimethoprim   Assessment / Plan:     Visit Diagnoses: Other organ or system involvement in systemic  lupus erythematosus (HCC) - dxd at age 21.H/o +ANA, fatigue and myalgias.  PLQ discontinued in 2023 due to ocular toxicity: She has not experienced a flare of autoimmune disease being off medications.  She denies any history of oral ulcers, nasal ulcers, malar rash, photosensitivity, Raynaud's, lymphadenopathy or inflammatory arthritis.  She states she gets mild Raynaud's symptoms during the colder months.  Labs  in December 2024 were all negative.  We decided to repeat labs at the follow-up visit.  I advised her to contact us  if she develops any new symptoms.  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 Federico.  Repeat test negative in December 2024.  Patient is on Plavix .  Primary osteoarthritis of both hands-she has osteoarthritis in bilateral hands with CMC PIP and DIP thickening.  No synovitis was noted on the examination today.  Joint protection muscle strengthening was discussed.  Trochanteric bursitis, right hip-patient states she was having increased pain in the trochanteric bursa.  She had bilateral trochanteric bursa injection at the end of June at [facility].  She had no tenderness on the examination today.  Chronic pain of both knees-she denies discomfort today.  Pain in both feet-she had no tenderness across MCPs or PIPs.  Degeneration of intervertebral disc of lumbar region without discogenic back pain or lower extremity pain-she has chronic neck pain and discomfort in her lower back.  History of osteopenia-she is on calcium  and vitamin D.  Need for regular exercise was discussed.  Cerebrovascular accident (CVA) due to thrombosis of right middle cerebral artery [facility]) -she is on Plavix  by hematology.  Primary hypertension-blood pressure was elevated today at 171/80.  Repeat blood pressure was 147/64.  She was advised to monitor blood pressure closely and follow-up  with her PCP.  Nonrheumatic mitral valve regurgitation  Hyperlipidemia LDL goal <100  Stage 3a chronic kidney disease (HCC)-GFR is low and stable.  Gastroparesis  History of gastroesophageal reflux (GERD)  Peptic ulcer disease  History of IBS  Trigeminal neuralgia  Anemia, chronic disease  Recurrent major depressive disorder, in partial remission (HCC)-patient had recent worsening of her depression.  She states she was hospitalized for 8 days and was discharged yesterday.  She states she was switched to Cymbalta .  She states she has been tolerating Cymbalta  but it is causing insomnia at night.  Her mood is better since she has been discharged from the hospital.  She has noticed improvement in her pain symptoms on Cymbalta .  GAD (generalized anxiety disorder)  Orders: Orders Placed This Encounter  Procedures   Protein / creatinine ratio, urine   CBC with Differential/Platelet   Comprehensive metabolic panel with GFR   Anti-DNA antibody, double-stranded   C3 and C4   Sedimentation rate   ANA   No orders of the defined types were placed in this encounter.    Follow-Up Instructions: Return in about 6 months (around 10/30/2024) for Systemic lupus, Osteoarthritis.   Maya Nash, MD  Note - This record has been created using Animal nutritionist.  Chart creation errors have been sought, but may not always  have been located. Such creation errors do not reflect on  the standard of medical care.

## 2024-04-20 ENCOUNTER — Ambulatory Visit (INDEPENDENT_AMBULATORY_CARE_PROVIDER_SITE_OTHER)
Admission: EM | Admit: 2024-04-20 | Discharge: 2024-04-20 | Disposition: A | Discharge status: Other Acute Inpatient Hospital | Source: Home / Self Care

## 2024-04-20 ENCOUNTER — Telehealth (HOSPITAL_COMMUNITY): Payer: Self-pay

## 2024-04-20 ENCOUNTER — Emergency Department (HOSPITAL_COMMUNITY)
Admission: EM | Admit: 2024-04-20 | Discharge: 2024-04-21 | Attending: Emergency Medicine | Admitting: Emergency Medicine

## 2024-04-20 ENCOUNTER — Ambulatory Visit: Payer: Medicare Other

## 2024-04-20 ENCOUNTER — Telehealth (HOSPITAL_COMMUNITY): Payer: Self-pay | Admitting: *Deleted

## 2024-04-20 ENCOUNTER — Telehealth (HOSPITAL_COMMUNITY): Payer: Self-pay | Admitting: Family

## 2024-04-20 ENCOUNTER — Other Ambulatory Visit: Payer: Self-pay

## 2024-04-20 ENCOUNTER — Encounter (HOSPITAL_COMMUNITY): Payer: Self-pay

## 2024-04-20 DIAGNOSIS — Z66 Do not resuscitate: Secondary | ICD-10-CM | POA: Diagnosis not present

## 2024-04-20 DIAGNOSIS — Z8673 Personal history of transient ischemic attack (TIA), and cerebral infarction without residual deficits: Secondary | ICD-10-CM | POA: Diagnosis not present

## 2024-04-20 DIAGNOSIS — F419 Anxiety disorder, unspecified: Secondary | ICD-10-CM | POA: Diagnosis not present

## 2024-04-20 DIAGNOSIS — I10 Essential (primary) hypertension: Secondary | ICD-10-CM | POA: Diagnosis present

## 2024-04-20 DIAGNOSIS — R45851 Suicidal ideations: Secondary | ICD-10-CM | POA: Diagnosis present

## 2024-04-20 DIAGNOSIS — D72829 Elevated white blood cell count, unspecified: Secondary | ICD-10-CM | POA: Insufficient documentation

## 2024-04-20 DIAGNOSIS — Z79899 Other long term (current) drug therapy: Secondary | ICD-10-CM | POA: Insufficient documentation

## 2024-04-20 DIAGNOSIS — K219 Gastro-esophageal reflux disease without esophagitis: Secondary | ICD-10-CM | POA: Diagnosis not present

## 2024-04-20 DIAGNOSIS — Z7902 Long term (current) use of antithrombotics/antiplatelets: Secondary | ICD-10-CM | POA: Diagnosis not present

## 2024-04-20 DIAGNOSIS — G4700 Insomnia, unspecified: Secondary | ICD-10-CM

## 2024-04-20 DIAGNOSIS — R9431 Abnormal electrocardiogram [ECG] [EKG]: Secondary | ICD-10-CM | POA: Insufficient documentation

## 2024-04-20 DIAGNOSIS — F332 Major depressive disorder, recurrent severe without psychotic features: Secondary | ICD-10-CM | POA: Insufficient documentation

## 2024-04-20 LAB — POCT URINE DRUG SCREEN - MANUAL ENTRY (I-SCREEN)
POC Amphetamine UR: NOT DETECTED
POC Buprenorphine (BUP): NOT DETECTED
POC Cocaine UR: NOT DETECTED
POC Marijuana UR: NOT DETECTED
POC Methadone UR: NOT DETECTED
POC Methamphetamine UR: NOT DETECTED
POC Morphine: NOT DETECTED
POC Oxazepam (BZO): NOT DETECTED
POC Oxycodone UR: NOT DETECTED
POC Secobarbital (BAR): NOT DETECTED

## 2024-04-20 LAB — CBC
HCT: 39 % (ref 36.0–46.0)
Hemoglobin: 12.2 g/dL (ref 12.0–15.0)
MCH: 28.7 pg (ref 26.0–34.0)
MCHC: 31.3 g/dL (ref 30.0–36.0)
MCV: 91.8 fL (ref 80.0–100.0)
Platelets: 407 K/uL — ABNORMAL HIGH (ref 150–400)
RBC: 4.25 MIL/uL (ref 3.87–5.11)
RDW: 13.5 % (ref 11.5–15.5)
WBC: 12 K/uL — ABNORMAL HIGH (ref 4.0–10.5)
nRBC: 0 % (ref 0.0–0.2)

## 2024-04-20 LAB — BASIC METABOLIC PANEL WITH GFR
Anion gap: 7 (ref 5–15)
BUN: 24 mg/dL — ABNORMAL HIGH (ref 8–23)
CO2: 23 mmol/L (ref 22–32)
Calcium: 9.4 mg/dL (ref 8.9–10.3)
Chloride: 110 mmol/L (ref 98–111)
Creatinine, Ser: 1.29 mg/dL — ABNORMAL HIGH (ref 0.44–1.00)
GFR, Estimated: 45 mL/min — ABNORMAL LOW (ref >60)
Glucose, Bld: 144 mg/dL — ABNORMAL HIGH (ref 70–99)
Potassium: 4.5 mmol/L (ref 3.5–5.1)
Sodium: 140 mmol/L (ref 135–145)

## 2024-04-20 MED ORDER — OLANZAPINE 5 MG PO TBDP: 5.0000 mg | ORAL_TABLET | Freq: Three times a day (TID) | ORAL | Status: DC | PRN

## 2024-04-20 MED ORDER — ALUM & MAG HYDROXIDE-SIMETH 200-200-20 MG/5ML PO SUSP: 30.0000 mL | ORAL | Status: DC | PRN

## 2024-04-20 MED ORDER — OLANZAPINE 10 MG IM SOLR: 5.0000 mg | Freq: Three times a day (TID) | INTRAMUSCULAR | Status: DC | PRN

## 2024-04-20 MED ORDER — ACETAMINOPHEN 325 MG PO TABS: 650.0000 mg | ORAL_TABLET | Freq: Four times a day (QID) | ORAL | Status: DC | PRN
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
## 2024-04-20 NOTE — ED Provider Triage Note (Signed)
 Emergency Medicine Provider Triage Evaluation Note  Debbie Hatfield , a 68 y.o. female  was evaluated in triage.  Pt complains of no complaints today. Sent by [facility] via safe transport for medical clearance after elevated bp. Takes norvasc , spironolactone  and temisartan daily as prescribed. No chest pain, shob, or lightheadedness today.  Review of Systems  Positive: Blood pressure, medical clearance Negative:   Physical Exam  BP (!) 155/71 (BP Location: Right Arm)   Pulse 76   Temp 97.9 F (36.6 C)   Resp 17   SpO2 100%  Gen:   Awake, no distress   Resp:  Normal effort  MSK:   Moves extremities without difficulty  Other:    Medical Decision Making  Medically screening exam initiated at 9:19 PM.  Appropriate orders placed.  Debbie Hatfield was informed that the remainder of the evaluation will be completed by another provider, this initial triage assessment does not replace that evaluation, and the importance of remaining in the ED until their evaluation is complete.  Workup initiated in triage  Screenign labs and EKG ordered, will likely be stable for Debbie Hatfield transfer once cleared   Debbie Sherlean DEL, PA-C 04/20/24 2121

## 2024-04-20 NOTE — Telephone Encounter (Signed)
 Received a message from Regan to reschedule patients appointment to a sooner date due to issues with her medication. I called the patient and left a voicemail.

## 2024-04-20 NOTE — Discharge Instructions (Addendum)
 Return to [facility] upon medical clearance.

## 2024-04-20 NOTE — Telephone Encounter (Signed)
 Writer spoke with pt who called stating that she needs to be in the hospital and wanted to know what to do. Pt does endorse SI with plan to overdose on her BP meds. Her mother is there with her now at pt's home. I reviewed [facility] info with her including specific directions and she verbalizes understanding. Pt verbally contracts for safety. She states her mother does not drive so she will be driving herself and has arranged for her sister to pick up car. Writer advised pt that if she changed her mind please call us  back, or 911, and we can arrange transport to [facility]. Pt agrees and verbalizes understanding.

## 2024-04-20 NOTE — Telephone Encounter (Signed)
 Patient is calling to let you know she is having increased anxiety out the ying yang and is more depressed and withdrawn. She does not follow up up until Sep. Please review and advise, thank you

## 2024-04-20 NOTE — ED Notes (Addendum)
 Error

## 2024-04-20 NOTE — ED Triage Notes (Signed)
 Pt arrives from [facility] for medical clearance. Per pt, she needs medical clearance due to her BP being elevated when checking in at [facility]. Pt denies CP or SOB. Pt has hx of HTN and takes meds. Pt denies missed doses of BP meds.

## 2024-04-20 NOTE — ED Notes (Signed)
 Sister Ellouise Moats [phone] would like an update asap
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
## 2024-04-21 ENCOUNTER — Ambulatory Visit (INDEPENDENT_AMBULATORY_CARE_PROVIDER_SITE_OTHER)
Admission: EM | Admit: 2024-04-21 | Discharge: 2024-04-21 | Disposition: A | Discharge status: Other Acute Inpatient Hospital | Source: Intra-hospital

## 2024-04-21 ENCOUNTER — Inpatient Hospital Stay
Admission: AD | Admit: 2024-04-21 | Discharge: 2024-04-28 | DRG: 881 | Disposition: A | Discharge status: Home / Self Care | Source: Intra-hospital | Attending: Psychiatry | Admitting: Psychiatry

## 2024-04-21 ENCOUNTER — Other Ambulatory Visit: Payer: Self-pay

## 2024-04-21 ENCOUNTER — Ambulatory Visit

## 2024-04-21 ENCOUNTER — Encounter: Payer: Self-pay | Admitting: Psychiatry

## 2024-04-21 DIAGNOSIS — I129 Hypertensive chronic kidney disease with stage 1 through stage 4 chronic kidney disease, or unspecified chronic kidney disease: Secondary | ICD-10-CM | POA: Diagnosis present

## 2024-04-21 DIAGNOSIS — Z85828 Personal history of other malignant neoplasm of skin: Secondary | ICD-10-CM

## 2024-04-21 DIAGNOSIS — Z91410 Personal history of adult physical and sexual abuse: Secondary | ICD-10-CM

## 2024-04-21 DIAGNOSIS — R45851 Suicidal ideations: Secondary | ICD-10-CM | POA: Diagnosis present

## 2024-04-21 DIAGNOSIS — Z8711 Personal history of peptic ulcer disease: Secondary | ICD-10-CM

## 2024-04-21 DIAGNOSIS — Z9049 Acquired absence of other specified parts of digestive tract: Secondary | ICD-10-CM | POA: Diagnosis not present

## 2024-04-21 DIAGNOSIS — Z8673 Personal history of transient ischemic attack (TIA), and cerebral infarction without residual deficits: Secondary | ICD-10-CM

## 2024-04-21 DIAGNOSIS — F329 Major depressive disorder, single episode, unspecified: Secondary | ICD-10-CM | POA: Diagnosis present

## 2024-04-21 DIAGNOSIS — I10 Essential (primary) hypertension: Secondary | ICD-10-CM | POA: Diagnosis not present

## 2024-04-21 DIAGNOSIS — Z62810 Personal history of physical and sexual abuse in childhood: Secondary | ICD-10-CM

## 2024-04-21 DIAGNOSIS — Z9152 Personal history of nonsuicidal self-harm: Secondary | ICD-10-CM

## 2024-04-21 DIAGNOSIS — Z818 Family history of other mental and behavioral disorders: Secondary | ICD-10-CM

## 2024-04-21 DIAGNOSIS — F332 Major depressive disorder, recurrent severe without psychotic features: Secondary | ICD-10-CM | POA: Insufficient documentation

## 2024-04-21 DIAGNOSIS — F4310 Post-traumatic stress disorder, unspecified: Secondary | ICD-10-CM | POA: Diagnosis present

## 2024-04-21 DIAGNOSIS — F410 Panic disorder [episodic paroxysmal anxiety] without agoraphobia: Secondary | ICD-10-CM | POA: Diagnosis present

## 2024-04-21 DIAGNOSIS — Z888 Allergy status to other drugs, medicaments and biological substances status: Secondary | ICD-10-CM

## 2024-04-21 DIAGNOSIS — Z634 Disappearance and death of family member: Secondary | ICD-10-CM

## 2024-04-21 DIAGNOSIS — N1831 Chronic kidney disease, stage 3a: Secondary | ICD-10-CM | POA: Diagnosis present

## 2024-04-21 DIAGNOSIS — M8580 Other specified disorders of bone density and structure, unspecified site: Secondary | ICD-10-CM | POA: Diagnosis present

## 2024-04-21 DIAGNOSIS — Z1152 Encounter for screening for COVID-19: Secondary | ICD-10-CM | POA: Diagnosis not present

## 2024-04-21 DIAGNOSIS — Z8249 Family history of ischemic heart disease and other diseases of the circulatory system: Secondary | ICD-10-CM | POA: Diagnosis not present

## 2024-04-21 DIAGNOSIS — Z7902 Long term (current) use of antithrombotics/antiplatelets: Secondary | ICD-10-CM

## 2024-04-21 DIAGNOSIS — Z880 Allergy status to penicillin: Secondary | ICD-10-CM

## 2024-04-21 DIAGNOSIS — Z882 Allergy status to sulfonamides status: Secondary | ICD-10-CM

## 2024-04-21 DIAGNOSIS — I340 Nonrheumatic mitral (valve) insufficiency: Secondary | ICD-10-CM | POA: Diagnosis present

## 2024-04-21 DIAGNOSIS — E785 Hyperlipidemia, unspecified: Secondary | ICD-10-CM | POA: Diagnosis present

## 2024-04-21 DIAGNOSIS — Z833 Family history of diabetes mellitus: Secondary | ICD-10-CM | POA: Diagnosis not present

## 2024-04-21 DIAGNOSIS — Z823 Family history of stroke: Secondary | ICD-10-CM

## 2024-04-21 DIAGNOSIS — Z79899 Other long term (current) drug therapy: Secondary | ICD-10-CM

## 2024-04-21 DIAGNOSIS — K219 Gastro-esophageal reflux disease without esophagitis: Secondary | ICD-10-CM | POA: Diagnosis present

## 2024-04-21 LAB — SARS CORONAVIRUS 2 BY RT PCR: SARS Coronavirus 2 by RT PCR: NEGATIVE

## 2024-04-21 MED ORDER — CLOPIDOGREL BISULFATE 75 MG PO TABS: 75.0000 mg | ORAL_TABLET | Freq: Every day | ORAL | Status: DC

## 2024-04-21 MED ORDER — DULOXETINE HCL 30 MG PO CPEP
30.0000 mg | ORAL_CAPSULE | Freq: Every day | ORAL | Status: DC
  Administered 2024-04-22 – 2024-04-23 (×2): 30 mg via ORAL
  Filled 2024-04-21 (×2): qty 1

## 2024-04-21 MED ORDER — ACETAMINOPHEN 325 MG PO TABS: 650.0000 mg | ORAL_TABLET | Freq: Four times a day (QID) | ORAL | Status: DC | PRN

## 2024-04-21 MED ORDER — ROSUVASTATIN CALCIUM 5 MG PO TABS: 5.0000 mg | ORAL_TABLET | Freq: Every day | ORAL | Status: DC

## 2024-04-21 MED ORDER — ACYCLOVIR 200 MG PO CAPS: 400.0000 mg | ORAL_CAPSULE | Freq: Every day | ORAL | Status: DC

## 2024-04-21 MED ORDER — ALUM & MAG HYDROXIDE-SIMETH 200-200-20 MG/5ML PO SUSP: 30.0000 mL | ORAL | Status: DC | PRN

## 2024-04-21 MED ORDER — VENLAFAXINE HCL 37.5 MG PO TABS
37.5000 mg | ORAL_TABLET | Freq: Every day | ORAL | Status: DC
  Administered 2024-04-21: 37.5 mg via ORAL
  Filled 2024-04-21: qty 1

## 2024-04-21 MED ORDER — SPIRONOLACTONE 12.5 MG HALF TABLET: 12.5000 mg | ORAL_TABLET | Freq: Every day | ORAL | Status: DC

## 2024-04-21 MED ORDER — TIZANIDINE HCL 2 MG PO TABS
4.0000 mg | ORAL_TABLET | Freq: Every day | ORAL | Status: DC
  Administered 2024-04-21: 4 mg via ORAL
  Filled 2024-04-21: qty 2

## 2024-04-21 MED ORDER — PANTOPRAZOLE SODIUM 40 MG PO TBEC
40.0000 mg | DELAYED_RELEASE_TABLET | Freq: Two times a day (BID) | ORAL | Status: AC
  Administered 2024-04-21 – 2024-04-27 (×14): 40 mg via ORAL
  Filled 2024-04-21 (×14): qty 1

## 2024-04-21 MED ORDER — TRAZODONE HCL 50 MG PO TABS: 50.0000 mg | ORAL_TABLET | Freq: Every evening | ORAL | Status: DC | PRN

## 2024-04-21 MED ORDER — IRBESARTAN 150 MG PO TABS: 150.0000 mg | ORAL_TABLET | Freq: Every day | ORAL | Status: DC

## 2024-04-21 MED ORDER — GABAPENTIN 300 MG PO CAPS
600.0000 mg | ORAL_CAPSULE | Freq: Two times a day (BID) | ORAL | Status: AC
  Administered 2024-04-21 – 2024-04-27 (×14): 600 mg via ORAL
  Filled 2024-04-21 (×14): qty 2

## 2024-04-21 MED ORDER — SPIRONOLACTONE 12.5 MG HALF TABLET
12.5000 mg | ORAL_TABLET | Freq: Two times a day (BID) | ORAL | Status: AC
  Administered 2024-04-21 – 2024-04-27 (×14): 12.5 mg via ORAL
  Filled 2024-04-21 (×16): qty 1

## 2024-04-21 MED ORDER — ACETAMINOPHEN 325 MG PO TABS: 650.0000 mg | ORAL_TABLET | Freq: Four times a day (QID) | ORAL | Status: AC | PRN

## 2024-04-21 MED ORDER — AMLODIPINE BESYLATE 5 MG PO TABS
2.5000 mg | ORAL_TABLET | Freq: Every day | ORAL | Status: AC
  Administered 2024-04-21 – 2024-04-27 (×7): 2.5 mg via ORAL
  Filled 2024-04-21 (×7): qty 1

## 2024-04-21 MED ORDER — OLANZAPINE 10 MG IM SOLR: 5.0000 mg | Freq: Three times a day (TID) | INTRAMUSCULAR | Status: DC | PRN

## 2024-04-21 MED ORDER — AMLODIPINE BESYLATE 2.5 MG PO TABS
2.5000 mg | ORAL_TABLET | Freq: Every day | ORAL | Status: DC
  Administered 2024-04-21: 2.5 mg via ORAL
  Filled 2024-04-21: qty 1

## 2024-04-21 MED ORDER — MAGNESIUM HYDROXIDE 400 MG/5ML PO SUSP: 30.0000 mL | Freq: Every day | ORAL | Status: DC | PRN

## 2024-04-21 MED ORDER — DOXEPIN HCL 25 MG PO CAPS
50.0000 mg | ORAL_CAPSULE | Freq: Every day | ORAL | Status: DC
  Administered 2024-04-21: 50 mg via ORAL
  Filled 2024-04-21: qty 2

## 2024-04-21 MED ORDER — HYDROXYZINE HCL 25 MG PO TABS: 25.0000 mg | ORAL_TABLET | Freq: Three times a day (TID) | ORAL | Status: DC | PRN

## 2024-04-21 MED ORDER — ALUM & MAG HYDROXIDE-SIMETH 200-200-20 MG/5ML PO SUSP
30.0000 mL | ORAL | Status: AC | PRN
  Administered 2024-04-25 – 2024-04-27 (×4): 30 mL via ORAL
  Filled 2024-04-21 (×4): qty 30

## 2024-04-21 MED ORDER — GABAPENTIN 300 MG PO CAPS
300.0000 mg | ORAL_CAPSULE | Freq: Three times a day (TID) | ORAL | Status: DC
  Administered 2024-04-21: 300 mg via ORAL
  Filled 2024-04-21: qty 1

## 2024-04-21 MED ORDER — DOXEPIN HCL 50 MG PO CAPS
50.0000 mg | ORAL_CAPSULE | Freq: Every day | ORAL | Status: DC
  Administered 2024-04-21 – 2024-04-24 (×4): 50 mg via ORAL
  Filled 2024-04-21 (×4): qty 1

## 2024-04-21 MED ORDER — AMLODIPINE BESYLATE 5 MG PO TABS
5.0000 mg | ORAL_TABLET | Freq: Every day | ORAL | Status: AC
  Administered 2024-04-21 – 2024-04-27 (×7): 5 mg via ORAL
  Filled 2024-04-21 (×7): qty 1

## 2024-04-21 MED ORDER — OLANZAPINE 5 MG PO TBDP: 5.0000 mg | ORAL_TABLET | Freq: Three times a day (TID) | ORAL | Status: DC | PRN

## 2024-04-21 NOTE — Plan of Care (Signed)
  Problem: Self-Concept: Goal: Ability to identify factors that promote anxiety will improve Outcome: Progressing Goal: Level of anxiety will decrease Outcome: Progressing Goal: Ability to modify response to factors that promote anxiety will improve Outcome: Progressing   Problem: Education: Goal: Ability to state activities that reduce stress will improve Outcome: Progressing

## 2024-04-21 NOTE — Group Note (Signed)
 Date:  04/21/2024 Time:  11:07 AM  Group Topic/Focus:  Outside Rec/Music Therapy  The purpose of this group is too allow patients to go out and get fresh air while doing some outside activities. Also listening to soothing music     Participation Level:  Active  Participation Quality:  Appropriate  Affect:  Appropriate  Cognitive:  Appropriate  Insight: Appropriate  Engagement in Group:  Engaged  Modes of Intervention:  Activity  Additional Comments:   Debbie Hatfield 04/21/2024, 11:07 AM

## 2024-04-21 NOTE — ED Notes (Signed)
 Safe transport called and is on the way to pick up patient

## 2024-04-21 NOTE — ED Notes (Signed)
 Report given to Cathlyn RN at [facility] , safe transport notified by Diplomatic Services operational officer .

## 2024-04-21 NOTE — ED Provider Notes (Signed)
 [place] EMERGENCY DEPARTMENT AT [facility] Provider Note   CSN: [id number] Arrival date & time: 04/20/24  2046     Patient presents with: Medical Clearance   Debbie Hatfield is a 68 y.o. female.  Patient currently being seen by behavioral health urgent care due to major depressive disorder with sent to the emergency room for medical clearance.  The patient elevated blood pressures upon arrival at the behavioral urgent care center.  At the time of arrival at the emergency department her blood pressure was 155/71.  She is asymptomatic and denies chest pain, urinary issues, headache, shortness of breath, abdominal pain.   HPI     Prior to Admission medications   Medication Sig Start Date End Date Taking? Authorizing Provider  ACETAMINOPHEN  PO Take 1,300 mg by mouth in the morning and at bedtime.    [provider]  acyclovir  (ZOVIRAX ) 400 MG tablet Take 1 tablet (400 mg total) by mouth daily. 01/06/24   Joshua Debby CROME, MD  amLODipine  (NORVASC ) 2.5 MG tablet Take 1 tablet (2.5 mg total) by mouth at bedtime. CONTINUE 5 MG TABLETS IN MORNING Patient taking differently: Take 2.5 mg by mouth 2 (two) times daily. 02/13/24 05/13/24  Raford Riggs, MD  baclofen  (LIORESAL ) 10 MG tablet Take 10 mg by mouth as needed. Patient not taking: Reported on 04/01/2024 11/25/23   [provider]  Cholecalciferol 125 MCG (5000 UT) capsule Take 5,000 Units by mouth daily. 02/12/24   [provider]  clonazePAM  (KLONOPIN ) 0.5 MG tablet Take 2 tablets (1 mg total) by mouth at bedtime as needed for anxiety. Patient not taking: Reported on 04/01/2024 03/11/24   Vear Charlie LABOR, MD  clopidogrel  (PLAVIX ) 75 MG tablet One po qd 08/07/23   Sater, Charlie LABOR, MD  doxepin  (SINEQUAN ) 50 MG capsule Take 50 mg by mouth daily.    [provider]  doxepin  (SINEQUAN ) 50 MG capsule Take 1 capsule (50 mg total) by mouth at bedtime. 04/01/24 04/01/25  Ezzard Staci SAILOR, NP  gabapentin   (NEURONTIN ) 300 MG capsule Take 600 mg by mouth 2 (two) times daily.    [provider]  pantoprazole  (PROTONIX ) 40 MG tablet Take 1 tablet (40 mg total) by mouth 2 (two) times daily before a meal. 04/02/24 07/01/24  Mansouraty, Aloha Raddle., MD  pravastatin  (PRAVACHOL ) 20 MG tablet Take 1 tablet (20 mg total) by mouth 3 (three) times a week. 02/28/24 05/28/24  Vannie Reche RAMAN, NP  spironolactone  (ALDACTONE ) 25 MG tablet Take 0.5 tablets (12.5 mg total) by mouth daily. 03/20/24   Vannie Reche RAMAN, NP  telmisartan  (MICARDIS ) 40 MG tablet Take 1 tablet (40 mg total) by mouth daily. 01/27/24   Tobb, Kardie, DO  tizanidine  (ZANAFLEX ) 2 MG capsule Take 4 mg by mouth at bedtime. 07/03/22   [provider]  traMADol  (ULTRAM ) 50 MG tablet Take 50 mg by mouth. 07/16/23   [provider]  venlafaxine  XR (EFFEXOR -XR) 37.5 MG 24 hr capsule Take 1 capsule (37.5 mg total) by mouth daily with breakfast. 04/01/24   Ezzard Staci SAILOR, NP    Allergies: Plaquenil  [hydroxychloroquine ], Penicillins, Sulfa antibiotics, and Sulfamethoxazole-trimethoprim    Review of Systems  Updated Vital Signs BP (!) 155/71 (BP Location: Right Arm)   Pulse 76   Temp 97.9 F (36.6 C)   Resp 17   Wt 67.1 kg   SpO2 100%   BMI 27.07 kg/m   Physical Exam Vitals and nursing note reviewed.  HENT:  Head: Normocephalic and atraumatic.  Eyes:     Conjunctiva/sclera: Conjunctivae normal.  Cardiovascular:     Rate and Rhythm: Normal rate.  Pulmonary:     Effort: Pulmonary effort is normal. No respiratory distress.  Musculoskeletal:        General: No signs of injury.     Cervical back: Normal range of motion.  Skin:    General: Skin is dry.  Neurological:     Mental Status: She is alert.  Psychiatric:        Speech: Speech normal.        Behavior: Behavior normal.     (all labs ordered are listed, but only abnormal results are displayed) Labs Reviewed  CBC - Abnormal; Notable for the following  components:      Result Value   WBC 12.0 (*)    Platelets 407 (*)    All other components within normal limits  BASIC METABOLIC PANEL WITH GFR - Abnormal; Notable for the following components:   Glucose, Bld 144 (*)    BUN 24 (*)    Creatinine, Ser 1.29 (*)    GFR, Estimated 45 (*)    All other components within normal limits    EKG: None  Radiology: No results found.   Procedures   Medications Ordered in the ED - No data to display                                  Medical Decision Making  Patient sent to the emergency room for medical clearance due to elevated blood pressure readings.  At this time patient has moderately elevated blood pressure with a reading of 155/71.  She has taken her home medications.  She denies any urinary complaints.  She denies headache.  She denies chest pain.  I ordered and interpreted labs.  Patient does have a mild leukocytosis with a white count of 12,000.  She has a creatinine of 1.29 which appears consistent with her overall baseline.  I ordered and reviewed an EKG which showed sinus rhythm.  At this time patient appears medically cleared for return to the behavioral urgent care center.  No sign of endorgan damage.  Patient may continue to take her home blood pressure medications.  Question if patient may have had transient hypertension due to stress earlier in the evening.    Final diagnoses:  Hypertension, unspecified type    ED Discharge Orders     None          Logan Ubaldo KATHEE DEVONNA 04/21/24 FONTAINE Trine Raynell Norman, MD 04/21/24 [phone]
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
## 2024-04-21 NOTE — Group Note (Signed)
 Date:  04/21/2024 Time:  9:06 PM  Group Topic/Focus:  Wrap-Up Group:   The focus of this group is to help patients review their daily goal of treatment and discuss progress on daily workbooks.    Participation Level:  Active  Participation Quality:  Appropriate  Affect:  Appropriate  Cognitive:  Appropriate  Insight: Appropriate  Engagement in Group:  Engaged  Modes of Intervention:  Discussion  Additional Comments:    Debbie Hatfield 04/21/2024, 9:06 PM
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
## 2024-04-21 NOTE — Group Note (Signed)
 [facility] LCSW Group Therapy Note   Group Date: 04/21/2024 Start Time: 1300 End Time: 1400   Type of Therapy and Topic: Group Therapy: Avoiding Self-Sabotaging and Enabling Behaviors  Participation Level: Active  Mood:  Description of Group:  In this group, patients will learn how to identify obstacles, self-sabotaging and enabling behaviors, as well as: what are they, why do we do them and what needs these behaviors meet. Discuss unhealthy relationships and how to have positive healthy boundaries with those that sabotage and enable. Explore aspects of self-sabotage and enabling in yourself and how to limit these self-destructive behaviors in everyday life.   Therapeutic Goals: 1. Patient will identify one obstacle that relates to self-sabotage and enabling behaviors 2. Patient will identify one personal self-sabotaging or enabling behavior they did prior to admission 3. Patient will state a plan to change the above identified behavior 4. Patient will demonstrate ability to communicate their needs through discussion and/or role play.    Summary of Patient Progress:   Pt appropriate throughout group and engaged with peers in meaningful discussion about group topic   Therapeutic Modalities:  Cognitive Behavioral Therapy Person-Centered Therapy Motivational Interviewing    Lum JONETTA Croft, LCSWA

## 2024-04-21 NOTE — [facility] Suicide Risk Assessment (Signed)
 [facility] Admission Suicide Risk Assessment   Nursing information obtained from:    Demographic factors:    Current Mental Status:    Loss Factors:    Historical Factors:    Risk Reduction Factors:     Total Time spent with patient: 30 minutes Principal Problem: Major depressive disorder with current active episode Diagnosis:  Principal Problem:   Major depressive disorder with current active episode  Subjective Data: Debbie Hatfield is a 68 year old female with psychiatric history of MDD and GAD, and medical history of hypertension, Lupus and stroke. Patient initially presented to the [facility] for suicidal ideations with plans to overdose on her pills or drive off [facility].Patient is admitted to [facility] unit with Q15 min safety monitoring. Multidisciplinary team approach is offered. Medication management; group/milieu therapy is offered.   Continued Clinical Symptoms:    The Alcohol Use Disorders Identification Test, Guidelines for Use in Primary Care, Second Edition.  World Science writer [facility]). Score between 0-7:  no or low risk or alcohol related problems. Score between 8-15:  moderate risk of alcohol related problems. Score between 16-19:  high risk of alcohol related problems. Score 20 or above:  warrants further diagnostic evaluation for alcohol dependence and treatment.   CLINICAL FACTORS:   Depression:   Hopelessness   Musculoskeletal: Strength & Muscle Tone: within normal limits Gait & Station: normal Patient leans: N/A  Psychiatric Specialty Exam:  Presentation  General Appearance:  Appropriate for Environment; Casual  Eye Contact: Fair  Speech: Normal Rate  Speech Volume: Normal  Handedness: Right   Mood and Affect  Mood: Anxious; Depressed; Hopeless  Affect: Depressed; Flat   Thought Process  Thought Processes: Coherent  Descriptions of Associations:Intact  Orientation:Full (Time, Place and Person)  Thought  Content:Logical  History of Schizophrenia/Schizoaffective disorder:No  Duration of Psychotic Symptoms:No data recorded Hallucinations:Hallucinations: None  Ideas of Reference:None  Suicidal Thoughts:Suicidal Thoughts: Yes, Passive SI Active Intent and/or Plan: With Plan SI Passive Intent and/or Plan: Without Intent; With Plan  Homicidal Thoughts:Homicidal Thoughts: No   Sensorium  Memory: Immediate Fair; Recent Fair; Remote Fair  Judgment: Impaired  Insight: Shallow   Executive Functions  Concentration: Fair  Attention Span: Fair  Recall: Fair  Fund of Knowledge: Fair  Language: Good   Psychomotor Activity  Psychomotor Activity: Psychomotor Activity: Normal   Assets  Assets: Housing; Physical Health   Sleep  Sleep: Sleep: Fair    Physical Exam: Physical Exam ROS Blood pressure 122/62, pulse (!) 55, temperature 97.6 F (36.4 C), resp. rate 16, height 5' 2 (1.575 m), weight 67.6 kg, SpO2 100%. Body mass index is 27.25 kg/m.   COGNITIVE FEATURES THAT CONTRIBUTE TO RISK:  None    SUICIDE RISK:   Moderate:  Frequent suicidal ideation with limited intensity, and duration, some specificity in terms of plans, no associated intent, good self-control, limited dysphoria/symptomatology, some risk factors present, and identifiable protective factors, including available and accessible social support.  PLAN OF CARE: Patient is admitted to [facility] psych unit with Q15 min safety monitoring. Multidisciplinary team approach is offered. Medication management; group/milieu therapy is offered.   I certify that inpatient services furnished can reasonably be expected to improve the patient's condition.   Donicia Druck, MD 04/21/2024, 4:30 PM

## 2024-04-21 NOTE — ED Notes (Signed)
 Safe transport notified of need to transfer.

## 2024-04-21 NOTE — Discharge Instructions (Addendum)
 Continue to take your home blood pressure medications.  Return to the emergency department if you develop any life-threatening symptoms.

## 2024-04-21 NOTE — Group Note (Signed)
 Recreation Therapy Group Note   Group Topic:Stress Management  Group Date: 04/21/2024 Start Time: 1100 End Time: 1135 Facilitators: Celestia Jeoffrey BRAVO, LRT, CTRS Location: Dayroom  Group Description: Meditation. LRT educated on the benefits of meditation and relaxation techniques, and how it can apply to everyday life post-discharge. LRT and pt's followed along to an audio script of a "guided meditation" video. LRT facilitated post-activity processing to gain feedback on session.   Goal Area(s) Addressed:  Patient will practice using relaxation technique. Patient will identify a new coping skill.  Patient will follow multistep directions to reduce anxiety and stress.   Affect/Mood: Appropriate   Participation Level: Active and Engaged   Participation Quality: Independent   Behavior: Appropriate, Calm, and Cooperative   Speech/Thought Process: Coherent   Insight: Good   Judgement: Good   Modes of Intervention: Education and Exploration   Patient Response to Interventions:  Attentive and Receptive   Education Outcome:  Acknowledges education   Clinical Observations/Individualized Feedback: Debbie Hatfield was active in their participation of session activities and group discussion. Pt shared that this was a helpful exercise. Pt completed all prompts appropriately.    Plan: Continue to engage patient in RT group sessions 2-3x/week.   [address] LRT, CTRS 04/21/2024 2:11 PM

## 2024-04-21 NOTE — Group Note (Signed)
 Recreation Therapy Group Note   Group Topic:Coping Skills  Group Date: 04/21/2024 Start Time: 1510 End Time: 1600 Facilitators: Celestia Jeoffrey BRAVO, LRT, CTRS Location: Dayroom  Group Description: Mind Map.  Patient was provided a blank template of a diagram with 32 blank boxes in a tiered system, branching from the center (similar to a bubble chart). LRT directed patients to label the middle of the diagram Coping Skills. LRT and patients then came up with 8 different coping skills as examples. Pt were directed to record their coping skills in the 2nd tier boxes closest to the center.  Patients would then share their coping skills with the group as LRT wrote them out. LRT gave a handout of 99 different coping skills at the end of group.   Goal Area(s) Addressed: Patients will be able to define "coping skills". Patient will identify new coping skills.  Patient will increase communication.   Affect/Mood: Appropriate   Participation Level: Active and Engaged   Participation Quality: Independent   Behavior: Appropriate, Calm, and Cooperative   Speech/Thought Process: Coherent   Insight: Good   Judgement: Good   Modes of Intervention: Education, Worksheet, and Writing   Patient Response to Interventions:  Attentive, Engaged, Interested , and Receptive   Education Outcome:  Acknowledges education   Clinical Observations/Individualized Feedback: Anetha was active in their participation of session activities and group discussion. Pt identified listening to music, going outside, petting animals, or grounding myself as coping skills. Pt interacted well with LRT and peers duration of session.    Plan: Continue to engage patient in RT group sessions 2-3x/week.   [address] LRT, CTRS 04/21/2024 5:02 PM

## 2024-04-21 NOTE — ED Notes (Signed)
 [facility] contacted for need to transport labs

## 2024-04-22 DIAGNOSIS — F332 Major depressive disorder, recurrent severe without psychotic features: Secondary | ICD-10-CM

## 2024-04-22 NOTE — Plan of Care (Signed)
  Problem: Education: Goal: Ability to state activities that reduce stress will improve Outcome: Progressing   Problem: Coping: Goal: Ability to identify and develop effective coping behavior will improve Outcome: Progressing   Problem: Self-Concept: Goal: Ability to identify factors that promote anxiety will improve Outcome: Progressing Goal: Level of anxiety will decrease Outcome: Progressing Goal: Ability to modify response to factors that promote anxiety will improve Outcome: Progressing   Problem: Education: Goal: Utilization of techniques to improve thought processes will improve Outcome: Progressing Goal: Knowledge of the prescribed therapeutic regimen will improve Outcome: Progressing   Problem: Activity: Goal: Interest or engagement in leisure activities will improve Outcome: Progressing Goal: Imbalance in normal sleep/wake cycle will improve Outcome: Progressing   Problem: Coping: Goal: Coping ability will improve Outcome: Progressing Goal: Will verbalize feelings Outcome: Progressing   Problem: Health Behavior/Discharge Planning: Goal: Ability to make decisions will improve Outcome: Progressing Goal: Compliance with therapeutic regimen will improve Outcome: Progressing   Problem: Role Relationship: Goal: Will demonstrate positive changes in social behaviors and relationships Outcome: Progressing   Problem: Safety: Goal: Ability to disclose and discuss suicidal ideas will improve Outcome: Progressing Goal: Ability to identify and utilize support systems that promote safety will improve Outcome: Progressing   Problem: Self-Concept: Goal: Will verbalize positive feelings about self Outcome: Progressing Goal: Level of anxiety will decrease Outcome: Progressing   Problem: Education: Goal: Ability to make informed decisions regarding treatment will improve Outcome: Progressing   Problem: Coping: Goal: Coping ability will improve Outcome:  Progressing   Problem: Health Behavior/Discharge Planning: Goal: Identification of resources available to assist in meeting health care needs will improve Outcome: Progressing   Problem: Medication: Goal: Compliance with prescribed medication regimen will improve Outcome: Progressing   Problem: Self-Concept: Goal: Ability to disclose and discuss suicidal ideas will improve Outcome: Progressing Goal: Will verbalize positive feelings about self Outcome: Progressing Note:

## 2024-04-22 NOTE — Progress Notes (Signed)
   04/22/24 2200  Psych Admission Type (Psych Patients Only)  Admission Status Voluntary  Psychosocial Assessment  Patient Complaints None  Eye Contact Brief  Facial Expression Anxious  Affect Anxious  Speech Soft  Interaction Minimal  Motor Activity Slow  Appearance/Hygiene Unremarkable  Behavior Characteristics Cooperative;Calm  Mood Pleasant  Thought Process  Coherency WDL  Content WDL  Delusions None reported or observed  Perception WDL  Hallucination None reported or observed  Judgment Poor  Confusion None  Danger to Self  Current suicidal ideation? Denies  Agreement Not to Harm Self Yes  Description of Agreement verbal  Danger to Others  Danger to Others None reported or observed

## 2024-04-22 NOTE — Progress Notes (Signed)
   04/21/24 2130  Psych Admission Type (Psych Patients Only)  Admission Status Voluntary  Psychosocial Assessment  Patient Complaints None  Eye Contact Brief  Facial Expression Flat  Affect Appropriate to circumstance  Speech Logical/coherent  Interaction Minimal  Motor Activity Slow  Appearance/Hygiene In scrubs  Behavior Characteristics Cooperative;Appropriate to situation;Calm  Mood Depressed  Thought Process  Coherency WDL  Content WDL  Delusions None reported or observed  Perception WDL  Hallucination None reported or observed  Judgment Limited  Confusion None  Danger to Self  Current suicidal ideation? Denies  Self-Injurious Behavior No self-injurious ideation or behavior indicators observed or expressed   Agreement Not to Harm Self Yes  Description of Agreement verbal  Danger to Others  Danger to Others None reported or observed

## 2024-04-22 NOTE — Plan of Care (Signed)
  Problem: Self-Concept: Goal: Ability to identify factors that promote anxiety will improve Outcome: Progressing Goal: Level of anxiety will decrease Outcome: Progressing Goal: Ability to modify response to factors that promote anxiety will improve Outcome: Progressing   
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
## 2024-04-22 NOTE — Group Note (Signed)
 Date:  04/22/2024 Time:  10:24 PM  Group Topic/Focus:  Identifying Needs:   The focus of this group is to help patients identify their personal needs that have been historically problematic and identify healthy behaviors to address their needs.    Participation Level:  Active  Participation Quality:  Appropriate  Affect:  Appropriate  Cognitive:  Appropriate  Insight: Appropriate  Engagement in Group:  Engaged  Modes of Intervention:  Confrontation and Education  Additional Comments:    Debbie Hatfield 04/22/2024, 10:24 PM

## 2024-04-22 NOTE — Group Note (Signed)
 Physical/Occupational Therapy Group Note  Group Topic: Transfer Training   Group Date: 04/22/2024 Start Time: 1300 End Time: 1330 Facilitators: Kaelee Pfeffer, Alm Hamilton, PT   Group Description: Group educated on sequence and techniques to maximize safety with functional transfers.  Additionally, integrated education on impact of seating surfaces, use of assistive device and management of orthostasis with movement transitions.  Patients actively engaged with functional transfers (sit/stand) from various seating surfaces, with and without assist devices, working to integrate and retain education provided during session.  Allowed time for questions and further discussion on mobility concerns/needs.   Therapeutic Goal(s):  Identify and demonstrate safe technique for sit/stand transfers from various seating surfaces. Identify and demonstrate safe use of assistive devices with basic transfers and simple mobility. Identify and demonstrate ability to recognize signs/symptoms of orthostasis and appropriate compensatory/safety techniques.  Individual Participation: Pt actively participated with the discussion and physical activity components of the session.  Pt was steady with standing activities without the need of an AD  Participation Level: Active and Engaged   Participation Quality: Minimal Cues   Behavior: Alert and Appropriate   Speech/Thought Process: Coherent, Focused, and Relevant   Affect/Mood: Appropriate   Insight: Good   Judgement: Good   Individualization: Per above  Modes of Intervention: Activity, Discussion, and Education  Patient Response to Interventions:  Attentive, Engaged, Interested , and Receptive   Plan: Continue to engage patient in PT/OT groups 1 - 2x/week.  CHARM Hamilton Bertin PT, DPT 04/22/24, 4:31 PM

## 2024-04-22 NOTE — Group Note (Signed)
 Date:  04/22/2024 Time:  11:26 AM  Group Topic/Focus:  Emotional Education:   The focus of this group is to discuss what feelings/emotions are, and how they are experienced.    Participation Level:  Active  Participation Quality:  Appropriate  Affect:  Appropriate  Cognitive:  Appropriate  Insight: Appropriate  Engagement in Group:  Engaged  Modes of Intervention:  Discussion   Debbie Hatfield 04/22/2024, 11:26 AM

## 2024-04-22 NOTE — BH IP Treatment Plan (Signed)
 Interdisciplinary Treatment and Diagnostic Plan Update  04/22/2024 Time of Session: 10:33 AM  Debbie Hatfield MRN: [id number]  Principal Diagnosis: Major depressive disorder with current active episode  Secondary Diagnoses: Principal Problem:   Major depressive disorder with current active episode   Current Medications:  Current Facility-Administered Medications  Medication Dose Route Frequency Provider Last Rate Last Admin   acetaminophen  (TYLENOL ) tablet 650 mg  650 mg Oral Q6H PRN Jadapalle, Sree, MD       alum & mag hydroxide-simeth (MAALOX/MYLANTA) 200-200-20 MG/5ML suspension 30 mL  30 mL Oral Q4H PRN Jadapalle, Sree, MD       amLODipine  (NORVASC ) tablet 2.5 mg  2.5 mg Oral QHS Jadapalle, Sree, MD   2.5 mg at 04/21/24 2142   amLODipine  (NORVASC ) tablet 5 mg  5 mg Oral Daily Jadapalle, Sree, MD   5 mg at 04/22/24 9056   doxepin  (SINEQUAN ) capsule 50 mg  50 mg Oral QHS Jadapalle, Sree, MD   50 mg at 04/21/24 2142   DULoxetine  (CYMBALTA ) DR capsule 30 mg  30 mg Oral Daily Jadapalle, Sree, MD   30 mg at 04/22/24 9056   gabapentin  (NEURONTIN ) capsule 600 mg  600 mg Oral BID Jadapalle, Sree, MD   600 mg at 04/22/24 9056   pantoprazole  (PROTONIX ) EC tablet 40 mg  40 mg Oral BID Jadapalle, Sree, MD   40 mg at 04/22/24 9056   spironolactone  (ALDACTONE ) tablet 12.5 mg  12.5 mg Oral BID Jadapalle, Sree, MD   12.5 mg at 04/22/24 9056   PTA Medications: Facility-Administered Medications Prior to Admission  Medication Dose Route Frequency Provider Last Rate Last Admin   [START ON 10/02/2024] denosumab  (PROLIA ) injection 60 mg  60 mg Subcutaneous Once Joshua Debby CROME, MD       Medications Prior to Admission  Medication Sig Dispense Refill Last Dose/Taking   ACETAMINOPHEN  PO Take 1,300 mg by mouth in the morning and at bedtime.      acyclovir  (ZOVIRAX ) 400 MG tablet Take 1 tablet (400 mg total) by mouth daily. 90 tablet 1    amLODipine  (NORVASC ) 2.5 MG tablet Take 1 tablet (2.5 mg total) by  mouth at bedtime. CONTINUE 5 MG TABLETS IN MORNING (Patient taking differently: Take 2.5 mg by mouth 2 (two) times daily.) 90 tablet 3    baclofen  (LIORESAL ) 10 MG tablet Take 10 mg by mouth as needed. (Patient not taking: Reported on 04/01/2024)      Cholecalciferol 125 MCG (5000 UT) capsule Take 5,000 Units by mouth daily.      clonazePAM  (KLONOPIN ) 0.5 MG tablet Take 2 tablets (1 mg total) by mouth at bedtime as needed for anxiety. (Patient not taking: Reported on 04/01/2024) 30 tablet 5    clopidogrel  (PLAVIX ) 75 MG tablet One po qd 90 tablet 4    doxepin  (SINEQUAN ) 50 MG capsule Take 50 mg by mouth daily.      doxepin  (SINEQUAN ) 50 MG capsule Take 1 capsule (50 mg total) by mouth at bedtime. 30 capsule 2    gabapentin  (NEURONTIN ) 300 MG capsule Take 600 mg by mouth 2 (two) times daily.      pantoprazole  (PROTONIX ) 40 MG tablet Take 1 tablet (40 mg total) by mouth 2 (two) times daily before a meal. 180 tablet 3    pravastatin  (PRAVACHOL ) 20 MG tablet Take 1 tablet (20 mg total) by mouth 3 (three) times a week. 36 tablet 0    spironolactone  (ALDACTONE ) 25 MG tablet Take 0.5 tablets (12.5 mg total) by  mouth daily. 45 tablet 3    telmisartan  (MICARDIS ) 40 MG tablet Take 1 tablet (40 mg total) by mouth daily. 30 tablet 3    tizanidine  (ZANAFLEX ) 2 MG capsule Take 4 mg by mouth at bedtime.      traMADol  (ULTRAM ) 50 MG tablet Take 50 mg by mouth.      venlafaxine  XR (EFFEXOR -XR) 37.5 MG 24 hr capsule Take 1 capsule (37.5 mg total) by mouth daily with breakfast. 60 capsule 0     Patient Stressors:    Patient Strengths:    Treatment Modalities: Medication Management, Group therapy, Case management,  1 to 1 session with clinician, Psychoeducation, Recreational therapy.   Physician Treatment Plan for Primary Diagnosis: Major depressive disorder with current active episode Long Term Goal(s): Improvement in symptoms so as ready for discharge   Short Term Goals: Ability to identify changes in  lifestyle to reduce recurrence of condition will improve Ability to verbalize feelings will improve Ability to disclose and discuss suicidal ideas Ability to demonstrate self-control will improve Ability to identify and develop effective coping behaviors will improve  Medication Management: Evaluate patient's response, side effects, and tolerance of medication regimen.  Therapeutic Interventions: 1 to 1 sessions, Unit Group sessions and Medication administration.  Evaluation of Outcomes: Not Progressing  Physician Treatment Plan for Secondary Diagnosis: Principal Problem:   Major depressive disorder with current active episode  Long Term Goal(s): Improvement in symptoms so as ready for discharge   Short Term Goals: Ability to identify changes in lifestyle to reduce recurrence of condition will improve Ability to verbalize feelings will improve Ability to disclose and discuss suicidal ideas Ability to demonstrate self-control will improve Ability to identify and develop effective coping behaviors will improve     Medication Management: Evaluate patient's response, side effects, and tolerance of medication regimen.  Therapeutic Interventions: 1 to 1 sessions, Unit Group sessions and Medication administration.  Evaluation of Outcomes: Not Progressing   RN Treatment Plan for Primary Diagnosis: Major depressive disorder with current active episode Long Term Goal(s): Knowledge of disease and therapeutic regimen to maintain health will improve  Short Term Goals: Ability to remain free from injury will improve, Ability to verbalize frustration and anger appropriately will improve, Ability to demonstrate self-control, Ability to participate in decision making will improve, Ability to verbalize feelings will improve, Ability to disclose and discuss suicidal ideas, Ability to identify and develop effective coping behaviors will improve, and Compliance with prescribed medications will  improve  Medication Management: RN will administer medications as ordered by provider, will assess and evaluate patient's response and provide education to patient for prescribed medication. RN will report any adverse and/or side effects to prescribing provider.  Therapeutic Interventions: 1 on 1 counseling sessions, Psychoeducation, Medication administration, Evaluate responses to treatment, Monitor vital signs and CBGs as ordered, Perform/monitor CIWA, COWS, AIMS and Fall Risk screenings as ordered, Perform wound care treatments as ordered.  Evaluation of Outcomes: Not Progressing   LCSW Treatment Plan for Primary Diagnosis: Major depressive disorder with current active episode Long Term Goal(s): Safe transition to appropriate next level of care at discharge, Engage patient in therapeutic group addressing interpersonal concerns.  Short Term Goals: Engage patient in aftercare planning with referrals and resources, Increase social support, Increase ability to appropriately verbalize feelings, Increase emotional regulation, Facilitate acceptance of mental health diagnosis and concerns, Facilitate patient progression through stages of change regarding substance use diagnoses and concerns, Identify triggers associated with mental health/substance abuse issues, and Increase skills for wellness  and recovery  Therapeutic Interventions: Assess for all discharge needs, 1 to 1 time with Child psychotherapist, Explore available resources and support systems, Assess for adequacy in community support network, Educate family and significant other(s) on suicide prevention, Complete Psychosocial Assessment, Interpersonal group therapy.  Evaluation of Outcomes: Not Progressing   Progress in Treatment: Attending groups: Yes. and No. Participating in groups: Yes. and No. Taking medication as prescribed: Yes. Toleration medication: Yes. Family/Significant other contact made: No, will contact:  CSW will contact if given  permission  Patient understands diagnosis: Yes. Discussing patient identified problems/goals with staff: Yes. Medical problems stabilized or resolved: Yes. Denies suicidal/homicidal ideation: Yes. Issues/concerns per patient self-inventory: No. Other: None   New problem(s) identified: No, Describe:  None identified   New Short Term/Long Term Goal(s):  elimination of symptoms of psychosis, medication management for mood stabilization; elimination of SI thoughts; development of comprehensive mental wellness/sobriety plan.   Patient Goals:  Mainly to get rid of that thought process, that feeling of helplessness to be gone   Discharge Plan or Barriers: CSW will assist with appropriate discharge planning   Reason for Continuation of Hospitalization: Depression Medication stabilization  Estimated Length of Stay: 1 to 7 days   Last 3 [place] Suicide Severity Risk Score: Flowsheet Row ED from 04/21/2024 in [facility] Most recent reading at 04/21/2024  4:47 AM ED from 04/20/2024 in [facility] at [facility] Most recent reading at 04/20/2024  9:21 PM ED from 04/20/2024 in [facility] Most recent reading at 04/20/2024  8:31 PM  C-SSRS RISK CATEGORY No Risk No Risk No Risk    Last PHQ 2/9 Scores:    11/26/2023    1:39 PM 10/29/2023    1:55 PM 08/28/2023   11:08 AM  Depression screen PHQ 2/9  Decreased Interest 3 0 2  Down, Depressed, Hopeless 2 0 3  PHQ - 2 Score 5 0 5  Altered sleeping 3  3  Tired, decreased energy 3  3  Change in appetite 3  0  Feeling bad or failure about yourself  1  0  Trouble concentrating 2  1  Moving slowly or fidgety/restless 2  0  Suicidal thoughts 0  0  PHQ-9 Score 19  12  Difficult doing work/chores Extremely dIfficult  Somewhat difficult    Scribe for Treatment Team: Lum JONETTA Croft, LCSWA 04/22/2024 1:45 PM

## 2024-04-22 NOTE — Progress Notes (Signed)
   04/22/24 1200  Psych Admission Type (Psych Patients Only)  Admission Status Voluntary  Psychosocial Assessment  Eye Contact Brief  Facial Expression Anxious  Affect Anxious;Depressed  Speech Soft  Interaction Minimal  Motor Activity Slow  Appearance/Hygiene Unremarkable  Behavior Characteristics Cooperative;Calm  Mood Pleasant  Thought Process  Coherency WDL  Content WDL  Delusions None reported or observed  Perception WDL  Hallucination None reported or observed  Judgment Poor  Confusion None  Danger to Self  Current suicidal ideation? Denies  Self-Injurious Behavior No self-injurious ideation or behavior indicators observed or expressed   Agreement Not to Harm Self Yes  Description of Agreement verbal  Danger to Others  Danger to Others None reported or observed

## 2024-04-23 ENCOUNTER — Encounter: Payer: Self-pay | Admitting: Psychiatry

## 2024-04-23 DIAGNOSIS — F332 Major depressive disorder, recurrent severe without psychotic features: Secondary | ICD-10-CM | POA: Diagnosis not present

## 2024-04-23 MED ORDER — CLOPIDOGREL BISULFATE 75 MG PO TABS
75.0000 mg | ORAL_TABLET | Freq: Every day | ORAL | Status: DC
  Administered 2024-04-23 – 2024-04-28 (×6): 75 mg via ORAL
  Filled 2024-04-23 (×6): qty 1

## 2024-04-23 MED ORDER — HYDROXYZINE HCL 25 MG PO TABS
25.0000 mg | ORAL_TABLET | Freq: Four times a day (QID) | ORAL | Status: DC | PRN
  Administered 2024-04-24 – 2024-04-27 (×6): 25 mg via ORAL
  Filled 2024-04-23 (×6): qty 1

## 2024-04-23 MED ORDER — HYDROXYZINE HCL 50 MG PO TABS
50.0000 mg | ORAL_TABLET | Freq: Four times a day (QID) | ORAL | Status: DC | PRN
  Administered 2024-04-23 – 2024-04-27 (×4): 50 mg via ORAL
  Filled 2024-04-23 (×4): qty 1

## 2024-04-23 MED ORDER — DULOXETINE HCL 30 MG PO CPEP
30.0000 mg | ORAL_CAPSULE | Freq: Two times a day (BID) | ORAL | Status: DC
  Administered 2024-04-23 – 2024-04-26 (×6): 30 mg via ORAL
  Filled 2024-04-23 (×6): qty 1

## 2024-04-23 NOTE — Plan of Care (Signed)
 Patient alert and oriented x4. Denies SI, HI, AVH and pain. Scheduled medications per MAR. Support and encouragement provided.  Routine safety checks conducted every 15 minutes.  Patient informed to notify staff with problems or concerns. No adverse drug reactions noted. Patient verbally contracts for safety at this time. Patient interacts well with others on the unit.  Patient remains safe at this time.  Problem: Education: Goal: Ability to state activities that reduce stress will improve Outcome: Progressing   Problem: Coping: Goal: Ability to identify and develop effective coping behavior will improve Outcome: Progressing   Problem: Education: Goal: Utilization of techniques to improve thought processes will improve Outcome: Progressing Goal: Knowledge of the prescribed therapeutic regimen will improve Outcome: Progressing   Problem: Health Behavior/Discharge Planning: Goal: Ability to make decisions will improve Outcome: Progressing Goal: Compliance with therapeutic regimen will improve Outcome: Progressing

## 2024-04-23 NOTE — Group Note (Signed)
 Recreation Therapy Group Note   Group Topic:Emotion Expression  Group Date: 04/23/2024 Start Time: 1500 End Time: 1550 Facilitators: Celestia Jeoffrey BRAVO, LRT, CTRS Location: Dayroom  Group Description: Painting a [employer] operational officer. Patients and LRT discuss what it means to be "at peace", what it feels like physically and mentally. Pts are given a canvas and watercolor paint to use and encouraged to draw their idea of a peaceful place. Pts and LRT discuss how they use this in their daily life post discharge. Pts are encouraged to take their canvas home with them as a reminder to find their peaceful place whenever they are feeling depressed, anxious, etc.    Goal Area(s) Addressed:  Patient will identify what it means to experience a "peaceful" emotion. Patient will identify a new coping skill.  Patient will express their emotions through art. Patients will increase communication by talking with LRT and peers while in group.   Affect/Mood: Appropriate   Participation Level: Active and Engaged   Participation Quality: Independent   Behavior: Appropriate, Calm, and Cooperative   Speech/Thought Process: Coherent   Insight: Good   Judgement: Good   Modes of Intervention: Art   Patient Response to Interventions:  Attentive, Engaged, Interested , and Receptive   Education Outcome:  Acknowledges education   Clinical Observations/Individualized Feedback: Marsa was active in their participation of session activities and group discussion. Pt identified a field with a dog and cat. Maybe some flowers, too as her peaceful place. Pt interacted well with LRT and peers duration of session.    Plan: Continue to engage patient in RT group sessions 2-3x/week.   Jeoffrey BRAVO Celestia, LRT, CTRS 04/23/2024 4:58 PM

## 2024-04-23 NOTE — Group Note (Signed)
 Recreation Therapy Group Note   Group Topic:General Recreation  Group Date: 04/23/2024 Start Time: 1100 End Time: 1135 Facilitators: Celestia Jeoffrey BRAVO, LRT, CTRS Location: Courtyard  Group Description: Outdoor Recreation. Patients had the option to play corn hole, ring toss, bowling or listening to music while outside in the courtyard getting fresh air and sunlight. Patients helped water and prune the raised garden beds. LRT and patients discussed things that they enjoy doing in their free time outside of the hospital. LRT encouraged patients to drink water after being active and getting their heart rate up.   Goal Area(s) Addressed: Patient will identify leisure interests.  Patient will practice healthy decision making. Patient will engage in recreation activity.   Affect/Mood: Appropriate   Participation Level: Active   Participation Quality: Independent   Behavior: Appropriate   Speech/Thought Process: Coherent   Insight: Good   Judgement: Good   Modes of Intervention: Activity   Patient Response to Interventions:  Receptive   Education Outcome:  Acknowledges education   Clinical Observations/Individualized Feedback: Debbie Hatfield was active in their participation of session activities and group discussion. Pt interacted well with LRT and peers duration of session.    Plan: Continue to engage patient in RT group sessions 2-3x/week.   Jeoffrey BRAVO Celestia, LRT, CTRS 04/23/2024 2:09 PM

## 2024-04-23 NOTE — Group Note (Signed)
 Date:  04/23/2024 Time:  8:33 PM  Group Topic/Focus:  Goals Group:   The focus of this group is to help patients establish daily goals to achieve during treatment and discuss how the patient can incorporate goal setting into their daily lives to aide in recovery.    Participation Level:  Active  Participation Quality:  Appropriate  Affect:  Appropriate  Cognitive:  Appropriate  Insight: Appropriate and Good  Engagement in Group:  Engaged  Modes of Intervention:  Discussion  Additional Comments:    Debbie Hatfield 04/23/2024, 8:33 PM

## 2024-04-23 NOTE — Plan of Care (Signed)
  Problem: Education: Goal: Ability to state activities that reduce stress will improve Outcome: Progressing   Problem: Coping: Goal: Ability to identify and develop effective coping behavior will improve Outcome: Progressing   Problem: Self-Concept: Goal: Ability to identify factors that promote anxiety will improve Outcome: Progressing Goal: Level of anxiety will decrease Outcome: Progressing Goal: Ability to modify response to factors that promote anxiety will improve Outcome: Progressing   Problem: Education: Goal: Utilization of techniques to improve thought processes will improve Outcome: Progressing Goal: Knowledge of the prescribed therapeutic regimen will improve Outcome: Progressing   Problem: Activity: Goal: Interest or engagement in leisure activities will improve Outcome: Progressing Goal: Imbalance in normal sleep/wake cycle will improve Outcome: Progressing   Problem: Coping: Goal: Coping ability will improve Outcome: Progressing Goal: Will verbalize feelings Outcome: Progressing   Problem: Health Behavior/Discharge Planning: Goal: Ability to make decisions will improve Outcome: Progressing Goal: Compliance with therapeutic regimen will improve Outcome: Progressing   Problem: Role Relationship: Goal: Will demonstrate positive changes in social behaviors and relationships Outcome: Progressing   Problem: Safety: Goal: Ability to disclose and discuss suicidal ideas will improve Outcome: Progressing Goal: Ability to identify and utilize support systems that promote safety will improve Outcome: Progressing   Problem: Self-Concept: Goal: Will verbalize positive feelings about self Outcome: Progressing Goal: Level of anxiety will decrease Outcome: Progressing   Problem: Education: Goal: Ability to make informed decisions regarding treatment will improve Outcome: Progressing   Problem: Coping: Goal: Coping ability will improve Outcome:  Progressing   Problem: Health Behavior/Discharge Planning: Goal: Identification of resources available to assist in meeting health care needs will improve Outcome: Progressing   Problem: Medication: Goal: Compliance with prescribed medication regimen will improve Outcome: Progressing   Problem: Self-Concept: Goal: Ability to disclose and discuss suicidal ideas will improve Outcome: Progressing Goal: Will verbalize positive feelings about self Outcome: Progressing Note: Patient is initiating therapy. Patient will work on increased adherence and work toward meeting this goal by discharge.
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
## 2024-04-23 NOTE — Group Note (Signed)
 Date:  04/23/2024 Time:  10:39 AM  Group Topic/Focus:  Movement Therapy    Participation Level:  Active  Participation Quality:  Appropriate  Affect:  Appropriate  Cognitive:  Appropriate  Insight: Appropriate  Engagement in Group:  Engaged  Modes of Intervention:  Activity  Additional Comments:  none  Norleen SHAUNNA Bias 04/23/2024, 10:39 AM

## 2024-04-23 NOTE — Group Note (Signed)
 [facility] LCSW Group Therapy Note   Group Date: 04/23/2024 Start Time: 1300 End Time: 1345   Type of Therapy/Topic:  Group Therapy:  Emotion Regulation  Participation Level:  Active   Mood:  Description of Group:    The purpose of this group is to assist patients in learning to regulate negative emotions and experience positive emotions. Patients will be guided to discuss ways in which they have been vulnerable to their negative emotions. These vulnerabilities will be juxtaposed with experiences of positive emotions or situations, and patients challenged to use positive emotions to combat negative ones. Special emphasis will be placed on coping with negative emotions in conflict situations, and patients will process healthy conflict resolution skills.  Therapeutic Goals: Patient will identify two positive emotions or experiences to reflect on in order to balance out negative emotions:  Patient will label two or more emotions that they find the most difficult to experience:  Patient will be able to demonstrate positive conflict resolution skills through discussion or role plays:   Summary of Patient Progress:   Pt was active an appropriate throughout group. Pt provided thoughtful insights to both facilitators and peers.     Therapeutic Modalities:   Cognitive Behavioral Therapy Feelings Identification Dialectical Behavioral Therapy   Lum JONETTA Croft, LCSWA

## 2024-04-24 DIAGNOSIS — F332 Major depressive disorder, recurrent severe without psychotic features: Secondary | ICD-10-CM | POA: Diagnosis not present

## 2024-04-24 MED ORDER — DOXEPIN HCL 50 MG PO CAPS
75.0000 mg | ORAL_CAPSULE | Freq: Every day | ORAL | Status: AC
  Administered 2024-04-25 – 2024-04-27 (×3): 75 mg via ORAL
  Filled 2024-04-24 (×3): qty 1

## 2024-04-24 MED ORDER — LOPERAMIDE HCL 2 MG PO CAPS
4.0000 mg | ORAL_CAPSULE | ORAL | Status: DC | PRN
  Administered 2024-04-24: 4 mg via ORAL
  Filled 2024-04-24: qty 2

## 2024-04-24 NOTE — Progress Notes (Signed)
   04/24/24 0500  Psych Admission Type (Psych Patients Only)  Admission Status Voluntary  Psychosocial Assessment  Patient Complaints Anxiety;Irritability  Eye Contact Fair  Facial Expression Flat  Affect Flat  Speech Soft  Interaction Minimal  Motor Activity Slow  Appearance/Hygiene Unremarkable  Behavior Characteristics Cooperative;Calm  Mood Pleasant  Aggressive Behavior  Effect No apparent injury  Thought Process  Coherency WDL  Content WDL  Delusions None reported or observed  Perception WDL  Hallucination None reported or observed  Judgment Poor  Confusion None  Danger to Self  Current suicidal ideation? Denies  Agreement Not to Harm Self Yes  Description of Agreement Verbal  Danger to Others  Danger to Others None reported or observed   Pt alert and able to make needs known.  Present in milieu, denies SI/HI/AVH at this time.  Received all medications as scheduled to include PRN Atarax  for increased anxiety with effective results.  Q69min safety checks remain in place.

## 2024-04-24 NOTE — Plan of Care (Incomplete)
  Problem: Education: Goal: Ability to state activities that reduce stress will improve Outcome: Progressing   Problem: Self-Concept: Goal: Ability to identify factors that promote anxiety will improve Outcome: Progressing Goal: Level of anxiety will decrease Outcome: Progressing   

## 2024-04-24 NOTE — Progress Notes (Signed)
   04/24/24 2200  Psych Admission Type (Psych Patients Only)  Admission Status Voluntary  Psychosocial Assessment  Patient Complaints None  Eye Contact Fair  Facial Expression Flat  Affect Flat  Speech Soft  Interaction Minimal  Motor Activity Slow  Appearance/Hygiene Unremarkable  Behavior Characteristics Cooperative  Mood Pleasant  Thought Process  Coherency WDL  Content WDL  Delusions None reported or observed  Perception WDL  Hallucination None reported or observed  Judgment Limited  Confusion None  Danger to Self  Current suicidal ideation? Denies  Agreement Not to Harm Self Yes  Description of Agreement verbal  Danger to Others  Danger to Others None reported or observed

## 2024-04-24 NOTE — Group Note (Signed)
 Date:  04/24/2024 Time:  4:16 PM  Group Topic/Focus:  Bingo Dayroom Activity   This group allows patients to come together and play bingo and also get to know one another while winning small prizes and rewards when they won bingo     Participation Level:  Active  Participation Quality:  Appropriate  Affect:  Appropriate  Cognitive:  Appropriate  Insight: Appropriate  Engagement in Group:  Engaged  Modes of Intervention:  Activity  Additional Comments:    Beatris ONEIDA Hasten 04/24/2024, 4:16 PM
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
## 2024-04-24 NOTE — Progress Notes (Signed)
 Patient is a voluntary admission to [facility] for [facility]D with anxiety.  Today patient c/o diarrhea x 4 and was medicated with imodium .  Denies SI, HI, AVH, and depression.  Rates her anxiety after prn vistaril  as 4/10.  Interacts well with peers and staff and participated in afternoon group but not this am group d/t stomach issues.  Will continue to monitor.

## 2024-04-24 NOTE — Group Note (Signed)
 Physical/Occupational Therapy Group Note  Group Topic: Pain Management and Coping   Group Date: 04/24/2024 Start Time: 1300 End Time: 1345 Facilitators: Clive Warren CROME, OT   Group Description:  Group discussed impact of chronic/acute pain on safety and independence with functional tasks and impact on mental health.  Identified and discussed any previously learned or implemented strategies used.  Discussed and reviewed cognitive behavioral pain coping strategies to address/improve overall management of pain. Discussed relaxation, distraction techniques, cognitive restructuring, activity pacing/energy conservation, environment/home safety modifications, and role of sleep and sleep hygiene. Allowed time for questions and further discussion.      Therapeutic Goal(s):   -  Identify and discuss previously utilized pain coping strategies and implications of pain on function/well-being -  Identify and discuss implementing new cognitive behavioral pain coping strategies into daily routines -  Demonstrate understanding and performance of learned cognitive behavioral pain coping strategies.   Individual Participation: PT did not attend.  Participation Level: Did not attend   Participation Quality:   Behavior:   Speech/Thought Process:   Affect/Mood:   Insight:   Judgement:   Modes of Intervention:   Patient Response to Interventions:    Plan: Continue to engage patient in PT/OT groups 1 - 2x/week.  Denyla Cortese R., MPH, MS, OTR/L ascom [phone] 04/24/24, 3:49 PM

## 2024-04-24 NOTE — Plan of Care (Signed)
  Problem: Education: Goal: Ability to state activities that reduce stress will improve Outcome: Progressing   Problem: Self-Concept: Goal: Ability to identify factors that promote anxiety will improve Outcome: Progressing Goal: Level of anxiety will decrease Outcome: Progressing   

## 2024-04-24 NOTE — Group Note (Signed)
 Date:  04/24/2024 Time:  11:05 AM  Group Topic/Focus:  Fresh air Therapy with music and conversation.    Participation Level:  Active  Participation Quality:  Appropriate  Affect:  Appropriate  Cognitive:  Appropriate  Insight: Appropriate  Engagement in Group:  Engaged  Modes of Intervention:  Socialization and music  Additional Comments:  none  Norleen SHAUNNA Bias 04/24/2024, 11:05 AM

## 2024-04-25 NOTE — Group Note (Signed)
 Date:  04/25/2024 Time:  10:49 AM  Group Topic/Focus:  Fresh air Therapy wirh music and conversation    Participation Level:  Active  Participation Quality:  Appropriate  Affect:  Appropriate  Cognitive:  Appropriate  Insight: Appropriate  Engagement in Group:  Engaged  Modes of Intervention:  Socialization and music  Additional Comments:  none  Norleen SHAUNNA Bias 04/25/2024, 10:49 AM
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
## 2024-04-25 NOTE — [facility] Suicide Risk Assessment (Signed)
 [facility] INPATIENT:  Family/Significant Other Suicide Prevention Education  Suicide Prevention Education:  Education Completed; Ellouise Moats sister [phone],  (name of family member/significant other) has been identified by the patient as the family member/significant other with whom the patient will be residing, and identified as the person(s) who will aid the patient in the event of a mental health crisis (suicidal ideations/suicide attempt).  With written consent from the patient, the family member/significant other has been provided the following suicide prevention education, prior to the and/or following the discharge of the patient.  The suicide prevention education provided includes the following: Suicide risk factors Suicide prevention and interventions National Suicide Hotline telephone number [facility] assessment telephone number [facility] Emergency Assistance 911 [facility] and/or Residential Mobile Crisis Unit telephone number  Request made of family/significant other to: Remove weapons (e.g., guns, rifles, knives), all items previously/currently identified as safety concern.   Remove drugs/medications (over-the-counter, prescriptions, illicit drugs), all items previously/currently identified as a safety concern.  The family member/significant other verbalizes understanding of the suicide prevention education information provided.  The family member/significant other agrees to remove the items of safety concern listed above.  Debbie Hatfield 04/25/2024, 12:19 PM

## 2024-04-25 NOTE — Group Note (Signed)
 Date:  04/25/2024 Time:  8:37 PM  Group Topic/Focus:  Self Care:   The focus of this group is to help patients understand the importance of self-care in order to improve or restore emotional, physical, spiritual, interpersonal, and financial health.    Participation Level:  Active  Participation Quality:  Appropriate  Affect:  Appropriate  Cognitive:  Appropriate  Insight: Appropriate  Engagement in Group:  Engaged  Modes of Intervention:  Education  Additional Comments:    Laymon ONEIDA Finder 04/25/2024, 8:37 PM

## 2024-04-25 NOTE — Progress Notes (Addendum)
   04/25/24 0955  Psych Admission Type (Psych Patients Only)  Admission Status Voluntary  Psychosocial Assessment  Patient Complaints None  Eye Contact Fair  Facial Expression Flat  Affect Flat  Speech Logical/coherent  Interaction Minimal (often stays in her room reading)  Motor Activity Slow  Appearance/Hygiene Unremarkable  Behavior Characteristics Cooperative  Mood Pleasant  Aggressive Behavior  Effect No apparent injury  Thought Process  Coherency WDL  Content WDL  Delusions None reported or observed  Perception WDL  Hallucination None reported or observed  Judgment Limited  Confusion None  Danger to Self  Current suicidal ideation? Denies  Description of Suicide Plan no plan currently  Self-Injurious Behavior No self-injurious ideation or behavior indicators observed or expressed   Agreement Not to Harm Self Yes  Description of Agreement verbal  Danger to Others  Danger to Others None reported or observed

## 2024-04-25 NOTE — Plan of Care (Signed)
  Problem: Education: Goal: Ability to state activities that reduce stress will improve Outcome: Progressing   Problem: Coping: Goal: Ability to identify and develop effective coping behavior will improve Outcome: Progressing   Problem: Self-Concept: Goal: Ability to identify factors that promote anxiety will improve Outcome: Progressing Goal: Level of anxiety will decrease Outcome: Progressing Goal: Ability to modify response to factors that promote anxiety will improve Outcome: Progressing   Problem: Education: Goal: Utilization of techniques to improve thought processes will improve Outcome: Progressing Goal: Knowledge of the prescribed therapeutic regimen will improve Outcome: Progressing   Problem: Activity: Goal: Interest or engagement in leisure activities will improve Outcome: Progressing Goal: Imbalance in normal sleep/wake cycle will improve Outcome: Progressing   Problem: Coping: Goal: Coping ability will improve Outcome: Progressing Goal: Will verbalize feelings Outcome: Progressing   Problem: Health Behavior/Discharge Planning: Goal: Ability to make decisions will improve Outcome: Progressing Goal: Compliance with therapeutic regimen will improve Outcome: Progressing

## 2024-04-25 NOTE — Plan of Care (Signed)
  Problem: Education: Goal: Ability to state activities that reduce stress will improve Outcome: Progressing   Problem: Coping: Goal: Ability to identify and develop effective coping behavior will improve Outcome: Progressing   Problem: Self-Concept: Goal: Ability to identify factors that promote anxiety will improve Outcome: Progressing Goal: Level of anxiety will decrease Outcome: Progressing Goal: Ability to modify response to factors that promote anxiety will improve Outcome: Progressing   Problem: Education: Goal: Utilization of techniques to improve thought processes will improve Outcome: Progressing Goal: Knowledge of the prescribed therapeutic regimen will improve Outcome: Progressing   Problem: Activity: Goal: Interest or engagement in leisure activities will improve Outcome: Progressing Goal: Imbalance in normal sleep/wake cycle will improve Outcome: Progressing   Problem: Coping: Goal: Coping ability will improve Outcome: Progressing Goal: Will verbalize feelings Outcome: Progressing   Problem: Health Behavior/Discharge Planning: Goal: Ability to make decisions will improve Outcome: Progressing Goal: Compliance with therapeutic regimen will improve Outcome: Progressing   Problem: Role Relationship: Goal: Will demonstrate positive changes in social behaviors and relationships Outcome: Progressing   Problem: Safety: Goal: Ability to disclose and discuss suicidal ideas will improve Outcome: Progressing Goal: Ability to identify and utilize support systems that promote safety will improve Outcome: Progressing   Problem: Self-Concept: Goal: Will verbalize positive feelings about self Outcome: Progressing Goal: Level of anxiety will decrease Outcome: Progressing   Problem: Education: Goal: Ability to make informed decisions regarding treatment will improve Outcome: Progressing   Problem: Coping: Goal: Coping ability will improve Outcome:  Progressing   Problem: Health Behavior/Discharge Planning: Goal: Identification of resources available to assist in meeting health care needs will improve Outcome: Progressing   Problem: Medication: Goal: Compliance with prescribed medication regimen will improve Outcome: Progressing   Problem: Self-Concept: Goal: Ability to disclose and discuss suicidal ideas will improve Outcome: Progressing Goal: Will verbalize positive feelings about self Outcome: Progressing Note: Patient is on track. Patient will maintain adherence and work on increased adherence

## 2024-04-25 NOTE — Progress Notes (Signed)
   04/25/24 2200  Psych Admission Type (Psych Patients Only)  Admission Status Voluntary  Psychosocial Assessment  Patient Complaints None  Eye Contact Fair  Facial Expression Flat  Affect Flat  Speech Logical/coherent  Interaction Minimal  Motor Activity Slow  Appearance/Hygiene Unremarkable  Behavior Characteristics Cooperative  Mood Pleasant  Aggressive Behavior  Effect No apparent injury  Thought Process  Coherency WDL  Content WDL  Delusions None reported or observed  Perception WDL  Hallucination None reported or observed  Judgment Limited  Confusion None  Danger to Self  Current suicidal ideation? Denies  Agreement Not to Harm Self Yes  Description of Agreement verbal  Danger to Others  Danger to Others None reported or observed

## 2024-04-25 NOTE — Plan of Care (Signed)
  Problem: Education: Goal: Ability to state activities that reduce stress will improve Outcome: Progressing   Problem: Coping: Goal: Ability to identify and develop effective coping behavior will improve Outcome: Progressing   Problem: Self-Concept: Goal: Ability to identify factors that promote anxiety will improve Outcome: Progressing Goal: Level of anxiety will decrease Outcome: Progressing Goal: Ability to modify response to factors that promote anxiety will improve Outcome: Progressing   Problem: Education: Goal: Utilization of techniques to improve thought processes will improve Outcome: Progressing Goal: Knowledge of the prescribed therapeutic regimen will improve Outcome: Progressing   Problem: Activity: Goal: Interest or engagement in leisure activities will improve Outcome: Progressing Goal: Imbalance in normal sleep/wake cycle will improve Outcome: Progressing   Problem: Coping: Goal: Coping ability will improve Outcome: Progressing Goal: Will verbalize feelings Outcome: Progressing   Problem: Health Behavior/Discharge Planning: Goal: Ability to make decisions will improve Outcome: Progressing Goal: Compliance with therapeutic regimen will improve Outcome: Progressing   Problem: Role Relationship: Goal: Will demonstrate positive changes in social behaviors and relationships Outcome: Progressing   Problem: Safety: Goal: Ability to disclose and discuss suicidal ideas will improve Outcome: Progressing Goal: Ability to identify and utilize support systems that promote safety will improve Outcome: Progressing   Problem: Self-Concept: Goal: Will verbalize positive feelings about self Outcome: Progressing Goal: Level of anxiety will decrease Outcome: Progressing   Problem: Education: Goal: Ability to make informed decisions regarding treatment will improve Outcome: Progressing   Problem: Coping: Goal: Coping ability will improve Outcome:  Progressing   Problem: Health Behavior/Discharge Planning: Goal: Identification of resources available to assist in meeting health care needs will improve Outcome: Progressing   Problem: Medication: Goal: Compliance with prescribed medication regimen will improve Outcome: Progressing   Problem: Self-Concept: Goal: Ability to disclose and discuss suicidal ideas will improve Outcome: Progressing Goal: Will verbalize positive feelings about self Outcome: Progressing Note: Patient is on track. Patient will be monitored by provider to determine if a change in treatment plan is warranted

## 2024-04-26 MED ORDER — DULOXETINE HCL 30 MG PO CPEP
30.0000 mg | ORAL_CAPSULE | Freq: Every day | ORAL | Status: DC
  Administered 2024-04-27 – 2024-04-28 (×2): 30 mg via ORAL
  Filled 2024-04-26 (×2): qty 1

## 2024-04-26 MED ORDER — DULOXETINE HCL 60 MG PO CPEP
60.0000 mg | ORAL_CAPSULE | Freq: Every day | ORAL | Status: DC
  Administered 2024-04-26 – 2024-04-27 (×2): 60 mg via ORAL
  Filled 2024-04-26 (×2): qty 1

## 2024-04-26 NOTE — Group Note (Signed)
 LCSW Group Therapy Note  Group Date: 04/26/2024 Start Time: 1300 End Time: 1400   Type of Therapy and Topic:  Group Therapy - Healthy vs Unhealthy Coping Skills  Participation Level:  Active   Description of Group The focus of this group was to determine what unhealthy coping techniques typically are used by group members and what healthy coping techniques would be helpful in coping with various problems. Patients were guided in becoming aware of the differences between healthy and unhealthy coping techniques. Patients were asked to identify 2-3 healthy coping skills they would like to learn to use more effectively.  Therapeutic Goals Patients learned that coping is what human beings do all day long to deal with various situations in their lives Patients defined and discussed healthy vs unhealthy coping techniques Patients identified their preferred coping techniques and identified whether these were healthy or unhealthy Patients determined 2-3 healthy coping skills they would like to become more familiar with and use more often. Patients provided support and ideas to each other   Summary of Patient Progress:  Patient proved open to input from peers and feedback from CSW. Patient demonstrated positive insight into the subject matter, was respectful of peers, and participated throughout the entire session.   Therapeutic Modalities Cognitive Behavioral Therapy Motivational Interviewing  Aldo CHRISTELLA Niece, [place] 04/26/2024  2:10 PM

## 2024-04-26 NOTE — Group Note (Signed)
 Date:  04/26/2024 Time:  11:51 AM  Group Topic/Focus:  Self Esteem Action Plan:   The focus of this group is to help patients create a plan to continue to build self-esteem after discharge.    Participation Level:  Active  Participation Quality:  Appropriate and Redirectable  Affect:  Appropriate  Cognitive:  Alert  Insight: Appropriate  Engagement in Group:  Engaged  Modes of Intervention:  Discussion  Additional Comments:  N/A  Harlene LITTIE Gavel 04/26/2024, 11:51 AM

## 2024-04-26 NOTE — Group Note (Signed)
 Date:  04/26/2024 Time:  10:10 PM  Group Topic/Focus:  Wrap-Up Group:   The focus of this group is to help patients review their daily goal of treatment and discuss progress on daily workbooks.    Participation Level:  Did Not Attend  Participation Quality:     Affect:     Cognitive:     Insight: None  Engagement in Group:  None  Modes of Intervention:     Additional Comments:    Tommas CHRISTELLA Bunker 04/26/2024, 10:10 PM

## 2024-04-26 NOTE — Plan of Care (Signed)
°  Problem: Education: Goal: Ability to state activities that reduce stress will improve Outcome: Progressing   Problem: Coping: Goal: Ability to identify and develop effective coping behavior will improve Outcome: Progressing   Problem: Self-Concept: Goal: Ability to identify factors that promote anxiety will improve Outcome: Progressing Goal: Level of anxiety will decrease Outcome: Progressing Goal: Ability to modify response to factors that promote anxiety will improve Outcome: Progressing   Problem: Education: Goal: Utilization of techniques to improve thought processes will improve Outcome: Progressing Goal: Knowledge of the prescribed therapeutic regimen will improve Outcome: Progressing
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
## 2024-04-27 DIAGNOSIS — F332 Major depressive disorder, recurrent severe without psychotic features: Secondary | ICD-10-CM | POA: Diagnosis not present

## 2024-04-27 NOTE — Plan of Care (Signed)
  Problem: Activity: Goal: Interest or engagement in leisure activities will improve Outcome: Progressing   Problem: Coping: Goal: Coping ability will improve Outcome: Progressing   Problem: Health Behavior/Discharge Planning: Goal: Compliance with therapeutic regimen will improve Outcome: Progressing   Problem: Self-Concept: Goal: Level of anxiety will decrease Outcome: Progressing

## 2024-04-27 NOTE — Group Note (Signed)
 Date:  04/27/2024 Time:  11:35 AM  Group Topic/Focus:  Personal Choices and Values:   The focus of this group is to help patients assess and explore the importance of values in their lives, how their values affect their decisions, how they express their values and what opposes their expression.    Participation Level:  Active  Participation Quality:  Appropriate  Affect:  Appropriate  Cognitive:  Appropriate  Insight: Good  Engagement in Group:  Engaged    Additional Comments:N/A  Harlene LITTIE Gavel 04/27/2024, 11:35 AM

## 2024-04-27 NOTE — Group Note (Signed)
 Date:  04/27/2024 Time:  10:05 PM  Group Topic/Focus:  Wrap-Up Group:   The focus of this group is to help patients review their daily goal of treatment and discuss progress on daily workbooks.    Participation Level:  Active  Participation Quality:  Appropriate  Affect:  Appropriate  Cognitive:  Alert  Insight: Appropriate  Engagement in Group:  Engaged  Modes of Intervention:  Discussion  Additional Comments:    Debbie Hatfield 04/27/2024, 10:05 PM

## 2024-04-27 NOTE — Progress Notes (Signed)
 Patient is pleasant and cooperative.  Denies SI/HI and AVH.  Denies anxiety and depression. Denies pain.  Reports she slept well.    Compliant with scheduled medications.  15 min checks in place for safety.  Patient is present in the milieu.  Appropriate interaction with peers and staff.   Patient requested medication for anxiety this evening. PRN medication given as ordered.

## 2024-04-27 NOTE — BH IP Treatment Plan (Signed)
 Interdisciplinary Treatment and Diagnostic Plan Update  04/27/2024 Time of Session: 2:30PM Debbie Hatfield MRN: [id number]  Principal Diagnosis: Major depressive disorder with current active episode  Secondary Diagnoses: Principal Problem:   Major depressive disorder with current active episode   Current Medications:  Current Facility-Administered Medications  Medication Dose Route Frequency Provider Last Rate Last Admin   acetaminophen  (TYLENOL ) tablet 650 mg  650 mg Oral Q6H PRN Jadapalle, Sree, MD       alum & mag hydroxide-simeth (MAALOX/MYLANTA) 200-200-20 MG/5ML suspension 30 mL  30 mL Oral Q4H PRN Jadapalle, Sree, MD   30 mL at 04/26/24 2212   amLODipine  (NORVASC ) tablet 2.5 mg  2.5 mg Oral QHS Jadapalle, Sree, MD   2.5 mg at 04/26/24 2206   clopidogrel  (PLAVIX ) tablet 75 mg  75 mg Oral Daily Jadapalle, Sree, MD   75 mg at 04/27/24 1039   doxepin  (SINEQUAN ) capsule 75 mg  75 mg Oral QHS Jadapalle, Sree, MD   75 mg at 04/26/24 2237   DULoxetine  (CYMBALTA ) DR capsule 30 mg  30 mg Oral Daily Hampton, Tracie B, NP   30 mg at 04/27/24 1038   DULoxetine  (CYMBALTA ) DR capsule 60 mg  60 mg Oral QHS Hampton, Tracie B, NP   60 mg at 04/26/24 2236   gabapentin  (NEURONTIN ) capsule 600 mg  600 mg Oral BID Jadapalle, Sree, MD   600 mg at 04/27/24 1038   hydrOXYzine  (ATARAX ) tablet 25 mg  25 mg Oral Q6H PRN Jadapalle, Sree, MD   25 mg at 04/27/24 1603   hydrOXYzine  (ATARAX ) tablet 50 mg  50 mg Oral Q6H PRN Jadapalle, Sree, MD   50 mg at 04/25/24 2253   loperamide  (IMODIUM ) capsule 4 mg  4 mg Oral PRN Jadapalle, Sree, MD   4 mg at 04/24/24 1438   pantoprazole  (PROTONIX ) EC tablet 40 mg  40 mg Oral BID Jadapalle, Sree, MD   40 mg at 04/27/24 1038   spironolactone  (ALDACTONE ) tablet 12.5 mg  12.5 mg Oral BID Jadapalle, Sree, MD   12.5 mg at 04/27/24 1039   PTA Medications: Facility-Administered Medications Prior to Admission  Medication Dose Route Frequency Provider Last Rate Last Admin   [START  ON 10/02/2024] denosumab  (PROLIA ) injection 60 mg  60 mg Subcutaneous Once Joshua Debby CROME, MD       Medications Prior to Admission  Medication Sig Dispense Refill Last Dose/Taking   ACETAMINOPHEN  PO Take 1,300 mg by mouth in the morning and at bedtime.      acyclovir  (ZOVIRAX ) 400 MG tablet Take 1 tablet (400 mg total) by mouth daily. 90 tablet 1    amLODipine  (NORVASC ) 2.5 MG tablet Take 1 tablet (2.5 mg total) by mouth at bedtime. CONTINUE 5 MG TABLETS IN MORNING (Patient taking differently: Take 2.5 mg by mouth 2 (two) times daily.) 90 tablet 3    baclofen  (LIORESAL ) 10 MG tablet Take 10 mg by mouth as needed. (Patient not taking: Reported on 04/01/2024)      Cholecalciferol 125 MCG (5000 UT) capsule Take 5,000 Units by mouth daily.      clonazePAM  (KLONOPIN ) 0.5 MG tablet Take 2 tablets (1 mg total) by mouth at bedtime as needed for anxiety. (Patient not taking: Reported on 04/01/2024) 30 tablet 5    clopidogrel  (PLAVIX ) 75 MG tablet One po qd 90 tablet 4    doxepin  (SINEQUAN ) 50 MG capsule Take 50 mg by mouth daily.      doxepin  (SINEQUAN ) 50 MG capsule Take 1 capsule (  50 mg total) by mouth at bedtime. 30 capsule 2    gabapentin  (NEURONTIN ) 300 MG capsule Take 600 mg by mouth 2 (two) times daily.      pantoprazole  (PROTONIX ) 40 MG tablet Take 1 tablet (40 mg total) by mouth 2 (two) times daily before a meal. 180 tablet 3    pravastatin  (PRAVACHOL ) 20 MG tablet Take 1 tablet (20 mg total) by mouth 3 (three) times a week. 36 tablet 0    spironolactone  (ALDACTONE ) 25 MG tablet Take 0.5 tablets (12.5 mg total) by mouth daily. 45 tablet 3    telmisartan  (MICARDIS ) 40 MG tablet Take 1 tablet (40 mg total) by mouth daily. 30 tablet 3    tizanidine  (ZANAFLEX ) 2 MG capsule Take 4 mg by mouth at bedtime.      traMADol  (ULTRAM ) 50 MG tablet Take 50 mg by mouth.      venlafaxine  XR (EFFEXOR -XR) 37.5 MG 24 hr capsule Take 1 capsule (37.5 mg total) by mouth daily with breakfast. 60 capsule 0     Patient  Stressors:    Patient Strengths:    Treatment Modalities: Medication Management, Group therapy, Case management,  1 to 1 session with clinician, Psychoeducation, Recreational therapy.   Physician Treatment Plan for Primary Diagnosis: Major depressive disorder with current active episode Long Term Goal(s): Improvement in symptoms so as ready for discharge   Short Term Goals: Ability to identify changes in lifestyle to reduce recurrence of condition will improve Ability to verbalize feelings will improve Ability to disclose and discuss suicidal ideas Ability to demonstrate self-control will improve Ability to identify and develop effective coping behaviors will improve  Medication Management: Evaluate patient's response, side effects, and tolerance of medication regimen.  Therapeutic Interventions: 1 to 1 sessions, Unit Group sessions and Medication administration.  Evaluation of Outcomes: Progressing  Physician Treatment Plan for Secondary Diagnosis: Principal Problem:   Major depressive disorder with current active episode  Long Term Goal(s): Improvement in symptoms so as ready for discharge   Short Term Goals: Ability to identify changes in lifestyle to reduce recurrence of condition will improve Ability to verbalize feelings will improve Ability to disclose and discuss suicidal ideas Ability to demonstrate self-control will improve Ability to identify and develop effective coping behaviors will improve     Medication Management: Evaluate patient's response, side effects, and tolerance of medication regimen.  Therapeutic Interventions: 1 to 1 sessions, Unit Group sessions and Medication administration.  Evaluation of Outcomes: Progressing   RN Treatment Plan for Primary Diagnosis: Major depressive disorder with current active episode Long Term Goal(s): Knowledge of disease and therapeutic regimen to maintain health will improve  Short Term Goals: Ability to demonstrate  self-control, Ability to participate in decision making will improve, Ability to verbalize feelings will improve, Ability to disclose and discuss suicidal ideas, Ability to identify and develop effective coping behaviors will improve, and Compliance with prescribed medications will improve  Medication Management: RN will administer medications as ordered by provider, will assess and evaluate patient's response and provide education to patient for prescribed medication. RN will report any adverse and/or side effects to prescribing provider.  Therapeutic Interventions: 1 on 1 counseling sessions, Psychoeducation, Medication administration, Evaluate responses to treatment, Monitor vital signs and CBGs as ordered, Perform/monitor CIWA, COWS, AIMS and Fall Risk screenings as ordered, Perform wound care treatments as ordered.  Evaluation of Outcomes: Progressing   LCSW Treatment Plan for Primary Diagnosis: Major depressive disorder with current active episode Long Term Goal(s): Safe transition to  appropriate next level of care at discharge, Engage patient in therapeutic group addressing interpersonal concerns.  Short Term Goals: Engage patient in aftercare planning with referrals and resources, Increase social support, Increase ability to appropriately verbalize feelings, Increase emotional regulation, Facilitate acceptance of mental health diagnosis and concerns, and Increase skills for wellness and recovery  Therapeutic Interventions: Assess for all discharge needs, 1 to 1 time with Social worker, Explore available resources and support systems, Assess for adequacy in community support network, Educate family and significant other(s) on suicide prevention, Complete Psychosocial Assessment, Interpersonal group therapy.  Evaluation of Outcomes: Progressing   Progress in Treatment: Attending groups: Yes. Participating in groups: Yes. Taking medication as prescribed: Yes. Toleration medication:  Yes. Family/Significant other contact made: Yes, individual(s) contacted:  SPE completed with the patient's sister.  Patient understands diagnosis: Yes. Discussing patient identified problems/goals with staff: Yes. Medical problems stabilized or resolved: Yes. Denies suicidal/homicidal ideation: Yes. Issues/concerns per patient self-inventory: No. Other: none  New problem(s) identified: No, Describe:  None identified  Update 04/27/2024:  No changes at this time.    New Short Term/Long Term Goal(s):  elimination of symptoms of psychosis, medication management for mood stabilization; elimination of SI thoughts; development of comprehensive mental wellness/sobriety plan. Update 04/27/2024:  No changes at this time.    Patient Goals:  Mainly to get rid of that thought process, that feeling of helplessness to be gone Update 04/27/2024:  No changes at this time.    Discharge Plan or Barriers: CSW will assist with appropriate discharge planning. Update 04/27/2024:  No changes at this time.    Reason for Continuation of Hospitalization: Depression Medication stabilization   Estimated Length of Stay: 1 to 7 days  Update 04/27/2024: TBD.     Last 3 [place] Suicide Severity Risk Score: Flowsheet Row Admission (Current) from 04/21/2024 in [facility] BE[facility]IORAL [facility]IC[facility] Most recent reading at 04/23/2024  9:29 AM ED from 04/21/2024 in [facility] Most recent reading at 04/21/2024  4:47 AM ED from 04/20/2024 in [facility] Emergency Department at [facility] Most recent reading at 04/20/2024  9:21 PM  C-SSRS RISK CATEGORY No Risk No Risk No Risk    Last PHQ 2/9 Scores:    11/26/2023    1:39 PM 10/29/2023    1:55 PM 08/28/2023   11:08 AM  Depression screen PHQ 2/9  Decreased Interest 3 0 2  Down, Depressed, Hopeless 2 0 3  PHQ - 2 Score 5 0 5  Altered sleeping 3  3  Tired, decreased energy 3  3  Change in appetite 3  0  Feeling bad or failure about yourself  1  0   Trouble concentrating 2  1  Moving slowly or fidgety/restless 2  0  Suicidal thoughts 0  0  PHQ-9 Score 19  12  Difficult doing work/chores Extremely dIfficult  Somewhat difficult    Scribe for Treatment Team: Sherryle JINNY Margo, KEN 04/27/2024 4:22 PM
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
## 2024-04-27 NOTE — Progress Notes (Signed)
   04/26/24 2230  Psych Admission Type (Psych Patients Only)  Admission Status Voluntary  Psychosocial Assessment  Patient Complaints None  Eye Contact Fair  Facial Expression Flat  Affect Flat  Speech Logical/coherent  Interaction Minimal  Motor Activity Slow  Appearance/Hygiene Unremarkable  Behavior Characteristics Cooperative  Mood Pleasant  Aggressive Behavior  Effect No apparent injury  Thought Process  Coherency WDL  Content WDL  Delusions None reported or observed  Perception WDL  Hallucination None reported or observed  Judgment Limited  Confusion None  Danger to Self  Current suicidal ideation? Denies  Agreement Not to Harm Self Yes  Description of Agreement Verbal  Danger to Others  Danger to Others None reported or observed

## 2024-04-27 NOTE — Group Note (Signed)
 Recreation Therapy Group Note   Group Topic:Communication  Group Date: 04/27/2024 Start Time: 1400 End Time: 1455 Facilitators: Celestia Jeoffrey BRAVO, LRT, CTRS Location: Dayroom  Group Description: Life Boat. Patients were given the scenario that they are on a boat that is about to become shipwrecked, leaving them stranded [place]. They are asked to make a list of 10 different items that they want to take with them when they are stranded on the [place]. Patients are asked to rank their items from most important to least important, #1 being the most important and #10 being the least. Patients will work individually for the first round to come up with 10 items and then pair up with a peer(s) to condense their list and come up with one list of 10 items between the two of them. Patients or LRT will read aloud the 10 different items to the group after each round. LRT facilitated post-activity processing to discuss how this activity can be used in daily life post discharge.   Goal Area(s) Addressed:  Patient will identify priorities, wants and needs. Patient will communicate with LRT and peers. Patient will work Engineering geologist as a Administrator, Civil Service. Patient will work on Product manager   Affect/Mood: Appropriate   Participation Level: Active and Engaged   Participation Quality: Independent   Behavior: Appropriate, Calm, and Cooperative   Speech/Thought Process: Coherent   Insight: Good   Judgement: Good   Modes of Intervention: Group work, Guided Discussion, and Problem-solving   Patient Response to Interventions:  Attentive, Engaged, Interested , and Receptive   Education Outcome:  Acknowledges education   Clinical Observations/Individualized Feedback: Debbie Hatfield was active in their participation of session activities and group discussion. Pt identified dog, tooth brush, life jacket, radio as things she would bring with her.    Plan: Continue to engage patient in RT group sessions  2-3x/week.   Jeoffrey BRAVO Celestia, LRT, CTRS 04/27/2024 4:56 PM

## 2024-04-27 NOTE — Progress Notes (Signed)
   04/27/24 2200  Psych Admission Type (Psych Patients Only)  Admission Status Voluntary  Psychosocial Assessment  Patient Complaints None  Eye Contact Fair  Facial Expression Flat  Affect Flat  Speech Logical/coherent  Interaction Assertive  Motor Activity Slow  Appearance/Hygiene Unremarkable  Behavior Characteristics Cooperative;Appropriate to situation  Mood Pleasant  Thought Process  Coherency WDL  Content WDL  Delusions None reported or observed  Perception WDL  Hallucination None reported or observed  Judgment Limited  Confusion None  Danger to Self  Current suicidal ideation? Denies  Agreement Not to Harm Self Yes  Description of Agreement verbal

## 2024-04-28 DIAGNOSIS — F332 Major depressive disorder, recurrent severe without psychotic features: Secondary | ICD-10-CM | POA: Diagnosis not present

## 2024-04-28 MED ORDER — CLOPIDOGREL BISULFATE 75 MG PO TABS: 75.0000 mg | ORAL_TABLET | Freq: Every day | ORAL | 0 refills | Status: DC

## 2024-04-28 MED ORDER — DULOXETINE HCL 60 MG PO CPEP: 60.0000 mg | ORAL_CAPSULE | Freq: Every day | ORAL | 0 refills | Status: DC

## 2024-04-28 MED ORDER — DULOXETINE HCL 30 MG PO CPEP: 30.0000 mg | ORAL_CAPSULE | Freq: Every day | ORAL | 0 refills | Status: DC

## 2024-04-28 NOTE — Care Management Important Message (Signed)
 Important Message  Patient Details  Name: Debbie Hatfield MRN: [id number] Date of Birth: 25-Jan-1956   Important Message Given:  Yes - Medicare IM     Lum JONETTA Croft, LCSWA 04/28/2024, 11:01 AM

## 2024-04-28 NOTE — Progress Notes (Signed)
 Discharge Note:  Patient denies SI/HI/AVH at this time. Discharge instructions, AVS, prescriptions, and transition record reviewed with patient. Patient agrees to comply with medication management, follow-up visit, and outpatient therapy. No belongings kept in the hospital.  Patient questions and concerns addressed and answered. Patient ambulatory off unit. Patient discharged home with a friend, [place].    Patient completed Suicide Safety Plan.  Copy placed with discharge paperwork.

## 2024-04-28 NOTE — Plan of Care (Signed)
  Problem: Education: Goal: Ability to state activities that reduce stress will improve Outcome: Progressing   Problem: Coping: Goal: Ability to identify and develop effective coping behavior will improve Outcome: Progressing   Problem: Self-Concept: Goal: Ability to identify factors that promote anxiety will improve Outcome: Progressing Goal: Level of anxiety will decrease Outcome: Progressing Goal: Ability to modify response to factors that promote anxiety will improve Outcome: Progressing   Problem: Education: Goal: Utilization of techniques to improve thought processes will improve Outcome: Progressing Goal: Knowledge of the prescribed therapeutic regimen will improve Outcome: Progressing   Problem: Activity: Goal: Interest or engagement in leisure activities will improve Outcome: Progressing Goal: Imbalance in normal sleep/wake cycle will improve Outcome: Progressing   Problem: Coping: Goal: Coping ability will improve Outcome: Progressing Goal: Will verbalize feelings Outcome: Progressing   Problem: Health Behavior/Discharge Planning: Goal: Ability to make decisions will improve Outcome: Progressing Goal: Compliance with therapeutic regimen will improve Outcome: Progressing   Problem: Role Relationship: Goal: Will demonstrate positive changes in social behaviors and relationships Outcome: Progressing   Problem: Safety: Goal: Ability to disclose and discuss suicidal ideas will improve Outcome: Progressing Goal: Ability to identify and utilize support systems that promote safety will improve Outcome: Progressing   Problem: Self-Concept: Goal: Will verbalize positive feelings about self Outcome: Progressing Goal: Level of anxiety will decrease Outcome: Progressing   

## 2024-04-28 NOTE — Group Note (Signed)
 Date:  04/28/2024 Time:  11:22 AM  Group Topic/Focus:  Outside Rec/Music Therapy The purpose of this group is for patients to go outside and get fresh air while participating in outside activities while  listening to soothing music.     Participation Level:  Active  Participation Quality:  Appropriate  Affect:  Appropriate  Cognitive:  Appropriate  Insight: Appropriate  Engagement in Group:  Engaged  Modes of Intervention:  Activity  Additional Comments:    Debbie Hatfield 04/28/2024, 11:22 AM

## 2024-04-28 NOTE — Progress Notes (Signed)
 Patient is pleasant and cooperative.  Denies SI/HI and AVH.  Denies anxiety and depression.  Denies pain.  Reports she slept well.   Compliant with scheduled medications.  15 min checks in place for safety.  Patient is present in the milieu.  Appropriate interaction with peers and staff.

## 2024-04-28 NOTE — BHH Suicide Risk Assessment (Signed)
 [facility] Discharge Suicide Risk Assessment   Principal Problem: Major depressive disorder with current active episode Discharge Diagnoses: Principal Problem:   Major depressive disorder with current active episode   Total Time spent with patient: 30 minutes  Musculoskeletal: Strength & Muscle Tone: within normal limits Gait & Station: normal Patient leans: N/A  Psychiatric Specialty Exam  Presentation  General Appearance:  Appropriate for Environment; Casual  Eye Contact: Fair  Speech: Clear and Coherent  Speech Volume: Normal  Handedness: Right   Mood and Affect  Mood: Euthymic  Duration of Depression Symptoms: Greater than two weeks  Affect: Appropriate   Thought Process  Thought Processes: Coherent  Descriptions of Associations:Intact  Orientation:Full (Time, Place and Person)  Thought Content:Logical  History of Schizophrenia/Schizoaffective disorder:No  Duration of Psychotic Symptoms:No data recorded Hallucinations:Hallucinations: None  Ideas of Reference:None  Suicidal Thoughts:Suicidal Thoughts: No  Homicidal Thoughts:Homicidal Thoughts: No   Sensorium  Memory: Immediate Fair; Recent Fair; Remote Fair  Judgment: Fair  Insight: Fair   Art therapist  Concentration: Fair  Attention Span: Fair  Recall: [employer] of Knowledge: Fair  Language: Fair   Psychomotor Activity  Psychomotor Activity: Psychomotor Activity: Normal   Assets  Assets: Communication Skills; Desire for Improvement; Resilience   Sleep  Sleep: Sleep: Fair  Estimated Sleeping Duration (Last 24 Hours): 5.50-7.75 hours  Physical Exam: Physical Exam ROS Blood pressure 135/67, pulse 68, temperature (!) 97.3 F (36.3 C), resp. rate 18, height 5' 2 (1.575 m), weight 67.6 kg, SpO2 100%. Body mass index is 27.25 kg/m.  Mental Status Per Nursing Assessment::   On Admission:  NA (Denies SI/HI/AAVH)  Demographic Factors:  Low  socioeconomic status  Loss Factors: Decrease in vocational status  Historical Factors: NA  Risk Reduction Factors:   Living with another person, especially a relative, Positive social support, Positive therapeutic relationship, and Positive coping skills or problem solving skills  Continued Clinical Symptoms:  Depression:   Insomnia  Cognitive Features That Contribute To Risk:  None    Suicide Risk:  Minimal: No identifiable suicidal ideation.  Patients presenting with no risk factors but with morbid ruminations; may be classified as minimal risk based on the severity of the depressive symptoms   Follow-up Information     [facility] [place] [facility] up on 05/04/2024.   Why: Your appointment is scheduled for 05/04/24 at 4:30 PM with your provider Staci Ricker. Contact information: [address] [place] [address] [phone]         [facility] up on 06/04/2024.   Why: Your appointment is scheduled with Joane on 06/04/24 at 3:00 PM. I asked about you being switched but none of the other therapists are accepting new patients at this time. Contact information: [address] [place] [address] [phone]                 Plan Of Care/Follow-up recommendations:  Activity:  As tolerated  Allyn Foil, MD 04/28/2024, 11:16 AM

## 2024-04-28 NOTE — Progress Notes (Signed)
 The order for 15 minutes safety checks expired at 1000.  RN was able to re-order the checks to begin again at 1100.     Staff rounded on patient at 1000, 1015, 1030, and 1045, but was unable to document in Epic.  Patient remained safe on the unit.  Present in the dayroom and walking in the halls.   Nurse manager made aware.

## 2024-04-28 NOTE — Discharge Summary (Signed)
 Physician Discharge Summary Note  Patient:  Debbie Hatfield is an 68 y.o., female MRN:  [id number] DOB:  Dec 12, 1955 Patient phone:  [phone] (home)  Patient address:   [address] [place] [facility] [address],   Time spent more than 35 minutes Date of Admission:  04/21/2024 Date of Discharge: 04/28/2024  Reason for Admission:  Debbie Hatfield is a 68 year old female with psychiatric history of MDD and GAD, and medical history of hypertension, Lupus and stroke. Patient initially presented to the [facility] for suicidal ideations with plans to overdose on her pills or drive off [facility].Patient is admitted to [facility] unit with Q15 min safety monitoring. Multidisciplinary team approach is offered. Medication management; group/milieu therapy is offered.   Principal Problem: Major depressive disorder with current active episode Discharge Diagnoses: Principal Problem:   Major depressive disorder with current active episode   Past Psychiatric History: See H&P  Family Psychiatric  History: See H&P Social History:  Social History   Substance and Sexual Activity  Alcohol Use Yes   Comment: social     Social History   Substance and Sexual Activity  Drug Use Never    Social History   Socioeconomic History   Marital status: Widowed    Spouse name: Not on file   Number of children: 0   Years of education: Not on file   Highest education level: Bachelor's degree (e.g., BA, AB, BS)  Occupational History    Comment: RN ortho/neuro at [employer]  Tobacco Use   Smoking status: Never    Passive exposure: Never   Smokeless tobacco: Never  Vaping Use   Vaping status: Never Used  Substance and Sexual Activity   Alcohol use: Yes    Comment: social   Drug use: Never   Sexual activity: Not Currently    Birth control/protection: Post-menopausal  Other Topics Concern   Not on file  Social History Narrative   06/13/22 living with her mother   2 [place] Dew per Week   Social Drivers  of Health   Financial Resource Strain: Low Risk  (03/30/2024)   Overall Financial Resource Strain (CARDIA)    Difficulty of Paying Living Expenses: Not very hard  Food Insecurity: Patient Declined (04/21/2024)   Hunger Vital Sign    Worried About Running Out of Food in the Last Year: Patient declined    Ran Out of Food in the Last Year: Patient declined  Transportation Needs: Patient Declined (04/21/2024)   PRAPARE - Transportation    Lack of Transportation (Medical): Patient declined    Lack of Transportation (Non-Medical): Patient declined  Physical Activity: Sufficiently Active (03/30/2024)   Exercise Vital Sign    Days of Exercise per Week: 5 days    Minutes of Exercise per Session: 60 min  Stress: Stress Concern Present (03/30/2024)   Debbie Hatfield of Occupational Health - Occupational Stress Questionnaire    Feeling of Stress: To some extent  Social Connections: Moderately Integrated (04/21/2024)   Social Connection and Isolation Panel    Frequency of Communication with Friends and Family: More than three times a week    Frequency of Social Gatherings with Friends and Family: More than three times a week    Attends Religious Services: 1 to 4 times per year    Active Member of [employer] or Organizations: No    Attends Banker Meetings: 1 to 4 times per year    Marital Status: Widowed  Recent Concern: Social Connections - Socially Isolated (03/30/2024)  Social Connection and Isolation Panel    Frequency of Communication with Friends and Family: More than three times a week    Frequency of Social Gatherings with Friends and Family: Twice a week    Attends Religious Services: Patient declined    Database administrator or Organizations: No    Attends Engineer, structural: Not on file    Marital Status: Widowed   Past Medical History:  Past Medical History:  Diagnosis Date   AKI (acute kidney injury) (HCC) 10/03/2023   Cancer (HCC) 03/30/2024   sqamous cell ca  lip and back   Closed fracture of right distal radius    Degeneration of lumbar intervertebral disc 01/26/2020   Depression with anxiety 10/03/2023   GAD (generalized anxiety disorder) 11/15/2021   Gastroparesis    GERD (gastroesophageal reflux disease)    History of CVA (cerebrovascular accident) 10/03/2023   Hyperlipidemia LDL goal <100 03/26/2022   The ASCVD Risk score (Arnett DK, et al., 2019) failed to calculate for the following reasons:    The valid HDL cholesterol range is 20 to 100 mg/dL     Hypertension    Hypotension 10/03/2023   Irritable bowel disease    Lupus    Major depression    Morton's neuroma of left foot 08/30/2023   Near syncope 10/03/2023   Need for shingles vaccine 11/15/2021   Need for vaccination 11/15/2021   Nonrheumatic mitral valve regurgitation 05/10/2022   Osteopenia 12/14/2015   Peptic ulcer disease 12/14/2015   Recurrent major depressive disorder, in partial remission (HCC) 11/15/2021   Sinus bradycardia 10/03/2023   Stage 3a chronic kidney disease (HCC) 11/15/2021   Estimated Creatinine Clearance: 49.7 mL/min (by C-G formula based on SCr of 1.01 mg/dL).      Estimated Creatinine Clearance: 45.9 mL/min (A) (by C-G formula based on SCr of 1.07 mg/dL (H)).      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   Stroke [facility])    Trigeminal neuralgia    Visit for screening mammogram 11/15/2021    Past Surgical History:  Procedure Laterality Date   BUBBLE STUDY  07/05/2022   Procedure: BUBBLE STUDY;  Surgeon: Loni Soyla LABOR, MD;  Location: [facility] ENDOSCOPY;  Service: Cardiology;;   CARPAL TUNNEL RELEASE Left 05/15/2023   CHOLECYSTECTOMY     COLONOSCOPY     LOOP RECORDER IMPLANT  2021   ORIF WRIST FRACTURE Right 09/22/2020   Procedure: OPEN REDUCTION INTERNAL FIXATION (ORIF) WRIST FRACTURE;  Surgeon: Carolee Lynwood JINNY DOUGLAS, MD;  Location: [facility];  Service: Orthopedics;  Laterality: Right;    TEE WITHOUT CARDIOVERSION N/A 07/05/2022    Procedure: TRANSESOPHAGEAL ECHOCARDIOGRAM (TEE);  Surgeon: Loni Soyla LABOR, MD;  Location: [facility] ENDOSCOPY;  Service: Cardiology;  Laterality: N/A;   UPPER GASTROINTESTINAL ENDOSCOPY     Family History:  Family History  Problem Relation Age of Onset   Heart block Mother        Pacemaker   Hypertension Mother    Depression Mother    Gout Mother    Macular degeneration Mother    Aneurysm Mother    Alcoholism Father    Heart failure Maternal Grandmother    Hypertension Maternal Grandmother    Congestive Heart Failure Maternal Grandmother    Diabetes Maternal Grandmother    Congestive Heart Failure Maternal Grandfather    Colon cancer Maternal Grandfather    Stroke Paternal Grandfather    Hypertension Paternal Grandfather    Esophageal cancer Neg Hx  Stomach cancer Neg Hx    Rectal cancer Neg Hx     Hospital Course:  Ammy Lienhard is a 68 year old female with psychiatric history of MDD and GAD, and medical history of hypertension, Lupus and stroke. Patient initially presented to the [facility] for suicidal ideations with plans to overdose on her pills or drive off [facility].Patient is admitted to [facility] unit with Q15 min safety monitoring. Multidisciplinary team approach is offered. Medication management; group/milieu therapy is offered.  Detailed risk assessment is complete based on clinical exam and individual risk factors and acute suicide risk is low and acute violence risk is low.   On admission patient wanted to stay away from benzos so Klonopin  as needed was not continued.  Her home medication of duloxetine  was continued and titrated up to 30 mg every morning and 60 mg nightly.  Patient tolerated medications with no reported side effects.  Patient maintain safe behaviors on the inpatient unit participated in groups.  On the day of discharge she consistently denied SI/HI/plan and hallucinations.  She remained future oriented and is willing to participate in outpatient mental  health services   Currently, all modifiable risk of harm to self/harm to others have been addressed and patient is no longer appropriate for the acute inpatient setting and is able to continue treatment for mental health needs in the community with the supports as indicated below.  Patient is educated and verbalized understanding of discharge plan of care including medications, follow-up appointments, mental health resources and further crisis services in the community.  Patient is instructed to call 911 or present to the nearest emergency room should she experience any decompensation in mood, disturbance of bowel or return of suicidal/homicidal ideations.  Patient verbalizes understanding of this education and agrees to this plan of care  Physical Findings: AIMS:  , ,  ,  ,    CIWA:    COWS:        Psychiatric Specialty Exam:  Presentation  General Appearance:  Appropriate for Environment; Casual  Eye Contact: Fair  Speech: Clear and Coherent  Speech Volume: Normal    Mood and Affect  Mood: Euthymic  Affect: Appropriate   Thought Process  Thought Processes: Coherent  Descriptions of Associations:Intact  Orientation:Full (Time, Place and Person)  Thought Content:Logical  Hallucinations:Hallucinations: None  Ideas of Reference:None  Suicidal Thoughts:Suicidal Thoughts: No  Homicidal Thoughts:Homicidal Thoughts: No   Sensorium  Memory: Immediate Fair; Recent Fair; Remote Fair  Judgment: Fair  Insight: Fair   Art therapist  Concentration: Fair  Attention Span: Fair  Recall: [employer] of Knowledge: Fair  Language: Fair   Psychomotor Activity  Psychomotor Activity: Psychomotor Activity: Normal  Musculoskeletal: Strength & Muscle Tone: within normal limits Gait & Station: normal Assets  Assets: Manufacturing systems engineer; Desire for Improvement; Resilience   Sleep  Sleep: Sleep: Fair    Physical Exam: Physical  Exam Vitals and nursing note reviewed.    ROS Blood pressure 135/67, pulse 68, temperature (!) 97.3 F (36.3 C), resp. rate 18, height 5' 2 (1.575 m), weight 67.6 kg, SpO2 100%. Body mass index is 27.25 kg/m.   Social History   Tobacco Use  Smoking Status Never   Passive exposure: Never  Smokeless Tobacco Never   Tobacco Cessation:  N/A, patient does not currently use tobacco products   Blood Alcohol level:  Lab Results  Component Value Date   ETH <10 05/07/2022    Metabolic Disorder Labs:  Lab Results  Component Value Date   HGBA1C 5.7 (H) 05/08/2022   MPG 116.89 05/08/2022   No results found for: PROLACTIN Lab Results  Component Value Date   CHOL 172 03/17/2024   TRIG 59 03/17/2024   HDL 96 03/17/2024   CHOLHDL 1.8 03/17/2024   VLDL 24 05/08/2022   LDLCALC 64 03/17/2024   LDLCALC 97 02/18/2024    See Psychiatric Specialty Exam and Suicide Risk Assessment completed by Attending Physician prior to discharge.  Discharge destination:  Home  Is patient on multiple antipsychotic therapies at discharge:  No   Has Patient had three or more failed trials of antipsychotic monotherapy by history:  No  Recommended Plan for Multiple Antipsychotic Therapies: NA  Discharge Instructions     Diet - low sodium heart healthy   Complete by: As directed    Increase activity slowly   Complete by: As directed       Allergies as of 04/28/2024       Reactions   Plaquenil  [hydroxychloroquine ] Other (See Comments)   Retinal damage   Penicillins Rash   Sulfa Antibiotics Nausea And Vomiting   Sulfamethoxazole-trimethoprim Nausea And Vomiting        Medication List     STOP taking these medications    ACETAMINOPHEN  PO   baclofen  10 MG tablet Commonly known as: LIORESAL    clonazePAM  0.5 MG tablet Commonly known as: KLONOPIN    Ultram  50 MG tablet Generic drug: traMADol    venlafaxine  XR 37.5 MG 24 hr capsule Commonly known as: EFFEXOR -XR       TAKE  these medications      Indication  acyclovir  400 MG tablet Commonly known as: ZOVIRAX  Take 1 tablet (400 mg total) by mouth daily.    amLODipine  2.5 MG tablet Commonly known as: NORVASC  Take 1 tablet (2.5 mg total) by mouth at bedtime. CONTINUE 5 MG TABLETS IN MORNING What changed:  when to take this additional instructions    Cholecalciferol 125 MCG (5000 UT) capsule Take 5,000 Units by mouth daily.    clopidogrel  75 MG tablet Commonly known as: PLAVIX  Take 1 tablet (75 mg total) by mouth daily. Start taking on: April 29, 2024 What changed:  how much to take how to take this when to take this additional instructions    doxepin  50 MG capsule Commonly known as: SINEQUAN  Take 1 capsule (50 mg total) by mouth at bedtime. What changed: Another medication with the same name was removed. Continue taking this medication, and follow the directions you see here.    DULoxetine  60 MG capsule Commonly known as: CYMBALTA  Take 1 capsule (60 mg total) by mouth at bedtime.    DULoxetine  30 MG capsule Commonly known as: CYMBALTA  Take 1 capsule (30 mg total) by mouth daily. Start taking on: April 29, 2024    gabapentin  300 MG capsule Commonly known as: NEURONTIN  Take 600 mg by mouth 2 (two) times daily.    pantoprazole  40 MG tablet Commonly known as: PROTONIX  Take 1 tablet (40 mg total) by mouth 2 (two) times daily before a meal.    pravastatin  20 MG tablet Commonly known as: PRAVACHOL  Take 1 tablet (20 mg total) by mouth 3 (three) times a week.    spironolactone  25 MG tablet Commonly known as: ALDACTONE  Take 0.5 tablets (12.5 mg total) by mouth daily.    telmisartan  40 MG tablet Commonly known as: MICARDIS  Take 1 tablet (40 mg total) by mouth daily.    tizanidine  2 MG capsule Commonly known as: ZANAFLEX  Take  4 mg by mouth at bedtime.         Follow-up Information     [facility] [place] [facility] Follow up on 05/04/2024.   Why: Your  appointment is scheduled for 05/04/24 at 4:30 PM with your provider Staci Ricker. Contact information: [address] [place] [address] [phone]         [facility] up on 06/04/2024.   Why: Your appointment is scheduled with Joane on 06/04/24 at 3:00 PM. I asked about you being switched but none of the other therapists are accepting new patients at this time. Contact information: [address] [place] [address] [phone]                 Follow-up recommendations:  Activity:  As tolerated    Signed: Melvinia Ashby, MD 04/28/2024, 11:17 AM

## 2024-04-28 NOTE — Plan of Care (Signed)
  Problem: Coping: Goal: Ability to identify and develop effective coping behavior will improve Outcome: Progressing   Problem: Self-Concept: Goal: Ability to identify factors that promote anxiety will improve Outcome: Progressing Goal: Level of anxiety will decrease Outcome: Progressing   Problem: Activity: Goal: Interest or engagement in leisure activities will improve Outcome: Progressing

## 2024-04-28 NOTE — Progress Notes (Signed)
  [facility] Adult Case Management Discharge Plan :  Will you be returning to the same living situation after discharge:  Yes,  pt will return home  At discharge, do you have transportation home?: Yes,  pt's sister Ellouise will pick her up  Do you have the ability to pay for your medications: Yes,  MEDICARE / MEDICARE PART A AND B  Release of information consent forms completed and in the chart;  Patient's signature needed at discharge.  Patient to Follow up at:  Follow-up Information     [facility] up on 05/04/2024.   Why: Your appointment is scheduled for 05/04/24 at 4:30 PM with your provider Staci Ricker. Contact information: [address] [place] [address] [phone]         [facility] Follow up on 06/04/2024.   Why: Your appointment is scheduled with Joane on 06/04/24 at 3:00 PM. I asked about you being switched but none of the other therapists are accepting new patients at this time. Contact information: [address] [place] [address] [phone]                 Next level of care provider has access to [facility] Link:yes  Safety Planning and Suicide Prevention discussed: Yes,  Ellouise Moats sister [phone]     Has patient been referred to the Quitline?: Patient does not use tobacco/nicotine products  Patient has been referred for addiction treatment: No known substance use disorder.  [address] LCSWA 04/28/2024, 11:00 AM

## 2024-04-29 ENCOUNTER — Ambulatory Visit: Payer: Medicare Other | Attending: Rheumatology | Admitting: Rheumatology

## 2024-04-29 ENCOUNTER — Encounter: Payer: Self-pay | Admitting: Rheumatology

## 2024-04-29 ENCOUNTER — Telehealth: Payer: Self-pay | Admitting: *Deleted

## 2024-04-29 VITALS — BP 147/64 | HR 81 | Resp 15 | Ht 62.0 in | Wt 148.6 lb

## 2024-04-29 DIAGNOSIS — I10 Essential (primary) hypertension: Secondary | ICD-10-CM | POA: Diagnosis present

## 2024-04-29 DIAGNOSIS — M51369 Other intervertebral disc degeneration, lumbar region without mention of lumbar back pain or lower extremity pain: Secondary | ICD-10-CM | POA: Diagnosis present

## 2024-04-29 DIAGNOSIS — G8929 Other chronic pain: Secondary | ICD-10-CM | POA: Diagnosis present

## 2024-04-29 DIAGNOSIS — F411 Generalized anxiety disorder: Secondary | ICD-10-CM | POA: Diagnosis present

## 2024-04-29 DIAGNOSIS — R760 Raised antibody titer: Secondary | ICD-10-CM | POA: Insufficient documentation

## 2024-04-29 DIAGNOSIS — M7061 Trochanteric bursitis, right hip: Secondary | ICD-10-CM | POA: Insufficient documentation

## 2024-04-29 DIAGNOSIS — F3341 Major depressive disorder, recurrent, in partial remission: Secondary | ICD-10-CM | POA: Insufficient documentation

## 2024-04-29 DIAGNOSIS — M25562 Pain in left knee: Secondary | ICD-10-CM | POA: Insufficient documentation

## 2024-04-29 DIAGNOSIS — K3184 Gastroparesis: Secondary | ICD-10-CM | POA: Insufficient documentation

## 2024-04-29 DIAGNOSIS — I340 Nonrheumatic mitral (valve) insufficiency: Secondary | ICD-10-CM | POA: Diagnosis present

## 2024-04-29 DIAGNOSIS — Z8719 Personal history of other diseases of the digestive system: Secondary | ICD-10-CM | POA: Insufficient documentation

## 2024-04-29 DIAGNOSIS — M19042 Primary osteoarthritis, left hand: Secondary | ICD-10-CM | POA: Diagnosis present

## 2024-04-29 DIAGNOSIS — K279 Peptic ulcer, site unspecified, unspecified as acute or chronic, without hemorrhage or perforation: Secondary | ICD-10-CM | POA: Diagnosis present

## 2024-04-29 DIAGNOSIS — M79671 Pain in right foot: Secondary | ICD-10-CM | POA: Insufficient documentation

## 2024-04-29 DIAGNOSIS — Z8739 Personal history of other diseases of the musculoskeletal system and connective tissue: Secondary | ICD-10-CM | POA: Insufficient documentation

## 2024-04-29 DIAGNOSIS — H35389 Toxic maculopathy, unspecified eye: Secondary | ICD-10-CM | POA: Insufficient documentation

## 2024-04-29 DIAGNOSIS — N1831 Chronic kidney disease, stage 3a: Secondary | ICD-10-CM | POA: Diagnosis present

## 2024-04-29 DIAGNOSIS — M19041 Primary osteoarthritis, right hand: Secondary | ICD-10-CM | POA: Diagnosis present

## 2024-04-29 DIAGNOSIS — T372X5A Adverse effect of antimalarials and drugs acting on other blood protozoa, initial encounter: Secondary | ICD-10-CM | POA: Diagnosis present

## 2024-04-29 DIAGNOSIS — M79672 Pain in left foot: Secondary | ICD-10-CM | POA: Insufficient documentation

## 2024-04-29 DIAGNOSIS — I63311 Cerebral infarction due to thrombosis of right middle cerebral artery: Secondary | ICD-10-CM | POA: Diagnosis present

## 2024-04-29 DIAGNOSIS — D638 Anemia in other chronic diseases classified elsewhere: Secondary | ICD-10-CM | POA: Insufficient documentation

## 2024-04-29 DIAGNOSIS — M25561 Pain in right knee: Secondary | ICD-10-CM | POA: Insufficient documentation

## 2024-04-29 DIAGNOSIS — E785 Hyperlipidemia, unspecified: Secondary | ICD-10-CM | POA: Insufficient documentation

## 2024-04-29 DIAGNOSIS — G500 Trigeminal neuralgia: Secondary | ICD-10-CM | POA: Diagnosis present

## 2024-04-29 DIAGNOSIS — M3219 Other organ or system involvement in systemic lupus erythematosus: Secondary | ICD-10-CM | POA: Diagnosis present

## 2024-04-29 NOTE — Transitions of Care (Post Inpatient/ED Visit) (Signed)
   04/29/2024  Name: Debbie Hatfield MRN: [id number] DOB: Mar 19, 1956  Today's TOC FU Call Status: Today's TOC FU Call Status:: Unsuccessful Call (1st Attempt) Unsuccessful Call (1st Attempt) Date: 04/29/24  Attempted to reach the patient regarding the most recent Inpatient/ED visit.  Follow Up Plan: Additional outreach attempts will be made to reach the patient to complete the Transitions of Care (Post Inpatient/ED visit) call.   Cathlean Headland BSN RN [facility] Health Care Management Coordinator [email or web address] Direct Dial: [phone]  Fax: [phone] Website: [email or web address]

## 2024-04-29 NOTE — Transitions of Care (Post Inpatient/ED Visit) (Signed)
 04/29/2024  Name: Debbie Hatfield MRN: [id number] DOB: January 13, 1956  Today's TOC FU Call Status: Today's TOC FU Call Status:: Successful TOC FU Call Completed TOC FU Call Complete Date: 04/29/24 Patient's Name and Date of Birth confirmed.  Transition Care Management Follow-up Telephone Call Discharge Facility: [facility]) [facility]) Type of Discharge: Inpatient Admission Primary Inpatient Discharge Diagnosis:: Major depressive disorder with current active episode How have you been since you were released from the hospital?: Better Any questions or concerns?: Yes Patient Questions/Concerns:: They did not order my medication for anxiety Patient Questions/Concerns Addressed: Other: (Patient has put in a call to PCP/ RN discussed with patient to call the outpatient behavorial health)  Items Reviewed: Did you receive and understand the discharge instructions provided?: Yes Medications obtained,verified, and reconciled?: Yes (Medications Reviewed) Any new allergies since your discharge?: No Dietary orders reviewed?: No Do you have support at home?: Yes People in Home [RPT]: alone Name of Support/Comfort Primary Source: Ellouise sister  Medications Reviewed Today: Medications Reviewed Today     Reviewed by Kennieth Cathlean DEL, RN (Case Manager) on 04/29/24 at 1143  Med List Status: <None>   Medication Order Taking? Sig Documenting Provider Last Dose Status Informant  acyclovir  (ZOVIRAX ) 400 MG tablet [id number] Yes Take 1 tablet (400 mg total) by mouth daily. Joshua Debby CROME, MD  Active   amLODipine  (NORVASC ) 2.5 MG tablet [id number] Yes Take 1 tablet (2.5 mg total) by mouth at bedtime. CONTINUE 5 MG TABLETS IN MORNING  Patient taking differently: Take 2.5 mg by mouth 2 (two) times daily.   Raford Riggs, MD  Active   Cholecalciferol 125 MCG (5000 UT) capsule [id number] Yes Take 5,000 Units by mouth daily. [provider]  Active   clopidogrel   (PLAVIX ) 75 MG tablet [id number] Yes Take 1 tablet (75 mg total) by mouth daily. Jadapalle, Sree, MD  Active   denosumab  (PROLIA ) injection 60 mg [id number]   Joshua Debby CROME, MD  Active   doxepin  (SINEQUAN ) 50 MG capsule [id number] Yes Take 1 capsule (50 mg total) by mouth at bedtime. Ezzard Staci SAILOR, NP  Active   DULoxetine  (CYMBALTA ) 30 MG capsule [id number] Yes Take 1 capsule (30 mg total) by mouth daily. Jadapalle, Sree, MD  Active   DULoxetine  (CYMBALTA ) 60 MG capsule [id number] Yes Take 1 capsule (60 mg total) by mouth at bedtime. Jadapalle, Sree, MD  Active   gabapentin  (NEURONTIN ) 300 MG capsule [id number] Yes Take 600 mg by mouth 2 (two) times daily. [provider]  Active Self           Med Note ROLENE EVERN PULLING A   Wed Oct 02, 2023  9:19 PM)    pantoprazole  (PROTONIX ) 40 MG tablet [id number] Yes Take 1 tablet (40 mg total) by mouth 2 (two) times daily before a meal. Mansouraty, Aloha Raddle., MD  Active   pravastatin  (PRAVACHOL ) 20 MG tablet [id number] Yes Take 1 tablet (20 mg total) by mouth 3 (three) times a week. Vannie Reche RAMAN, NP  Active   spironolactone  (ALDACTONE ) 25 MG tablet [id number] Yes Take 0.5 tablets (12.5 mg total) by mouth daily. Vannie Reche RAMAN, NP  Active   telmisartan  (MICARDIS ) 40 MG tablet [id number] Yes Take 1 tablet (40 mg total) by mouth daily. Tobb, Kardie, DO  Active   tizanidine  (ZANAFLEX ) 2 MG capsule [id number] Yes Take 4 mg by mouth at bedtime. [provider]  Active Self           Med  Note JACKOLYN, WISCONSIN R   Tue Mar 17, 2024  7:58 AM)              Home Care and Equipment/Supplies: Were Home Health Services Ordered?: NA Any new equipment or medical supplies ordered?: NA  Functional Questionnaire: Do you need assistance with bathing/showering or dressing?: No Do you need assistance with meal preparation?: No Do you need assistance with eating?: No Do you have difficulty maintaining continence: No Do you need assistance  with getting out of bed/getting out of a chair/moving?: No Do you have difficulty managing or taking your medications?: No  Follow up appointments reviewed: PCP Follow-up appointment confirmed?: Yes Date of PCP follow-up appointment?: 05/05/24 Follow-up Provider: Dr Debby Molt Specialist [facility] Follow-up appointment confirmed?: Yes Date of Specialist follow-up appointment?: 04/29/24 Follow-Up Specialty Provider:: Juli Deveshwar/ [id number] [facility] Do you need transportation to your follow-up appointment?: No Do you understand care options if your condition(s) worsen?: Yes-patient verbalized understanding  SDOH Interventions Today    Flowsheet Row Most Recent Value  SDOH Interventions   Food Insecurity Interventions Intervention Not Indicated  Housing Interventions Intervention Not Indicated  Transportation Interventions Intervention Not Indicated, Patient Resources (Friends/Family)  Utilities Interventions Intervention Not Indicated  Depression Interventions/Treatment  Medication, Counseling, Currently on Treatment    Goals Addressed             This Visit's Progress    [facility] Transitions of Care (TOC) Care Plan       Problems:  Recent Hospitalization for treatment of Depression: anxiety, disturbed sleep, and suicidal thoughts with specific plan and History of Suicidal Ideation Medication access barrier Per patient her anxiety medication was not reordered when discharged. Patient will follow up with PCP and  Outpatient Behavioral Health  Goal:  Over the next 30 days, the patient will not experience hospital readmission  Interventions:  Transitions of Care: Doctor Visits  - discussed the importance of doctor visits  Patient Self Care Activities:  Attend all scheduled provider appointments Call pharmacy for medication refills 3-7 days in advance of running out of medications Call provider office for new concerns or questions   Notify RN Care Manager of TOC call rescheduling needs Participate in Transition of Care Program/Attend TOC scheduled calls Perform all self care activities independently  Take medications as prescribed    Plan:  An initial telephone outreach has been scheduled for: [id number] Next PCP appointment scheduled for: [id number] Telephone follow up appointment with care management team member scheduled for:  [id number] Laine Tousey 1:00 The patient will call [facility]  as advised to follow up outpatient. Patient has the number for Outpatient Behavorial health and Crisis Hotline       Cathlean Headland BSN RN [facility] Health Care Management Coordinator [email or web address] Direct Dial: [phone]  Fax: [phone] Website: [email or web address]

## 2024-04-29 NOTE — Progress Notes (Signed)
 Carelink Summary Report / Loop Recorder

## 2024-05-04 ENCOUNTER — Ambulatory Visit (HOSPITAL_BASED_OUTPATIENT_CLINIC_OR_DEPARTMENT_OTHER): Admitting: Family

## 2024-05-04 ENCOUNTER — Other Ambulatory Visit: Payer: Self-pay

## 2024-05-04 ENCOUNTER — Encounter (HOSPITAL_COMMUNITY): Payer: Self-pay | Admitting: Family

## 2024-05-04 VITALS — BP 134/72 | HR 96 | Ht 62.0 in | Wt 150.0 lb

## 2024-05-04 DIAGNOSIS — F332 Major depressive disorder, recurrent severe without psychotic features: Secondary | ICD-10-CM | POA: Diagnosis not present

## 2024-05-04 MED ORDER — HYDROXYZINE PAMOATE 25 MG PO CAPS: 25.0000 mg | ORAL_CAPSULE | Freq: Three times a day (TID) | ORAL | 0 refills | Status: DC | PRN

## 2024-05-04 MED ORDER — DULOXETINE HCL 30 MG PO CPEP: 30.0000 mg | ORAL_CAPSULE | Freq: Every day | ORAL | 2 refills | Status: DC

## 2024-05-04 MED ORDER — DULOXETINE HCL 60 MG PO CPEP: 60.0000 mg | ORAL_CAPSULE | Freq: Every day | ORAL | 2 refills | Status: DC

## 2024-05-04 NOTE — Progress Notes (Unsigned)
 BH MD/PA/NP OP Progress Note  05/05/2024 1:11 PM Debbie Hatfield  MRN:  [id number]  Chief Complaint:  Medication management   HPI:  Debbie Hatfield 68 year old Caucasian female presents for medication management follow-up appointment.  She carries a diagnosis related to generalized anxiety disorder and major depressive disorder.  States she was recently hospitalized due to experiencing increased suicidal ideations.  States her stressors was attributed to family dynamics.  States she was hospitalized for roughly 1 week and states she is feeling better overall.  States her her mood and her mental health is in a better place.  Was discontinued from Effexor  and initiated on Cymbalta .  States she is tolerating Cymbalta  90 mg daily.  States she has recently discontinued pain medication that she was taking Tylenol  frequently for pain however states she no longer needs OTC medications.  States she has been resting okay  at night.  States some anxiety still lingering.  Discussed initiating hydroxyzine  25 to 50 mg nightly as needed she was receptive to plan.  Will continue to monitor symptoms.  Support encouragement and reassurance was provided.  Attempted to touch base with patient regarding individual therapy consideration.  Will await response.  Patient remained calm throughout assessment answer all questions appropriate.  Presents with a bright and pleasant affect.  She is alert and oriented x 3.  Mood is congruent she presents calm cooperative     Visit Diagnosis:    ICD-10-CM   1. Severe episode of recurrent major depressive disorder, without psychotic features (HCC)  F33.2       Past Psychiatric History:   Past Medical History:  Past Medical History:  Diagnosis Date   AKI (acute kidney injury) (HCC) 10/03/2023   Cancer (HCC) 03/30/2024   sqamous cell ca lip and back   Closed fracture of right distal radius    Degeneration of lumbar intervertebral disc 01/26/2020   Depression with anxiety  10/03/2023   GAD (generalized anxiety disorder) 11/15/2021   Gastroparesis    GERD (gastroesophageal reflux disease)    History of CVA (cerebrovascular accident) 10/03/2023   Hyperlipidemia LDL goal <100 03/26/2022   The ASCVD Risk score (Arnett DK, et al., 2019) failed to calculate for the following reasons:    The valid HDL cholesterol range is 20 to 100 mg/dL     Hypertension    Hypotension 10/03/2023   Irritable bowel disease    Lupus    Major depression    Morton's neuroma of left foot 08/30/2023   Near syncope 10/03/2023   Need for shingles vaccine 11/15/2021   Need for vaccination 11/15/2021   Nonrheumatic mitral valve regurgitation 05/10/2022   Osteopenia 12/14/2015   Peptic ulcer disease 12/14/2015   Recurrent major depressive disorder, in partial remission (HCC) 11/15/2021   Sinus bradycardia 10/03/2023   Stage 3a chronic kidney disease (HCC) 11/15/2021   Estimated Creatinine Clearance: 49.7 mL/min (by C-G formula based on SCr of 1.01 mg/dL).      Estimated Creatinine Clearance: 45.9 mL/min (A) (by C-G formula based on SCr of 1.07 mg/dL (H)).      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   Stroke [facility])    Trigeminal neuralgia    Visit for screening mammogram 11/15/2021    Past Surgical History:  Procedure Laterality Date   BUBBLE STUDY  07/05/2022   Procedure: BUBBLE STUDY;  Surgeon: Loni Soyla LABOR, MD;  Location: [facility] ENDOSCOPY;  Service: Cardiology;;   CARPAL TUNNEL RELEASE Left 05/15/2023   CHOLECYSTECTOMY     COLONOSCOPY  LOOP RECORDER IMPLANT  2021   ORIF WRIST FRACTURE Right 09/22/2020   Procedure: OPEN REDUCTION INTERNAL FIXATION (ORIF) WRIST FRACTURE;  Surgeon: Carolee Lynwood JINNY DOUGLAS, MD;  Location: [facility];  Service: Orthopedics;  Laterality: Right;    TEE WITHOUT CARDIOVERSION N/A 07/05/2022   Procedure: TRANSESOPHAGEAL ECHOCARDIOGRAM (TEE);  Surgeon: Loni Soyla LABOR, MD;  Location: [facility] ENDOSCOPY;  Service: Cardiology;  Laterality:  N/A;   UPPER GASTROINTESTINAL ENDOSCOPY      Family Psychiatric History:   Family History:  Family History  Problem Relation Age of Onset   Heart block Mother        Pacemaker   Hypertension Mother    Depression Mother    Gout Mother    Macular degeneration Mother    Aneurysm Mother    Alcoholism Father    Heart failure Maternal Grandmother    Hypertension Maternal Grandmother    Congestive Heart Failure Maternal Grandmother    Diabetes Maternal Grandmother    Congestive Heart Failure Maternal Grandfather    Colon cancer Maternal Grandfather    Stroke Paternal Grandfather    Hypertension Paternal Grandfather    Esophageal cancer Neg Hx    Stomach cancer Neg Hx    Rectal cancer Neg Hx     Social History:  Social History   Socioeconomic History   Marital status: Widowed    Spouse name: Not on file   Number of children: 0   Years of education: Not on file   Highest education level: Bachelor's degree (e.g., BA, AB, BS)  Occupational History    Comment: RN ortho/neuro at [employer]  Tobacco Use   Smoking status: Never    Passive exposure: Past   Smokeless tobacco: Never  Vaping Use   Vaping status: Never Used  Substance and Sexual Activity   Alcohol use: Yes    Comment: social   Drug use: Never   Sexual activity: Not Currently    Birth control/protection: Post-menopausal  Other Topics Concern   Not on file  Social History Narrative   06/13/22 living with her mother   2 [place] Dew per Week   Social Drivers of Health   Financial Resource Strain: Low Risk  (05/05/2024)   Overall Financial Resource Strain (CARDIA)    Difficulty of Paying Living Expenses: Not very hard  Food Insecurity: No Food Insecurity (05/05/2024)   Hunger Vital Sign    Worried About Running Out of Food in the Last Year: Never true    Ran Out of Food in the Last Year: Never true  Transportation Needs: No Transportation Needs (05/05/2024)   PRAPARE - Administrator, Civil Service  (Medical): No    Lack of Transportation (Non-Medical): No  Physical Activity: Sufficiently Active (05/05/2024)   Exercise Vital Sign    Days of Exercise per Week: 7 days    Minutes of Exercise per Session: 60 min  Stress: Stress Concern Present (05/05/2024)   [employer] of Occupational Health - Occupational Stress Questionnaire    Feeling of Stress: To some extent  Social Connections: Socially Isolated (05/05/2024)   Social Connection and Isolation Panel    Frequency of Communication with Friends and Family: More than three times a week    Frequency of Social Gatherings with Friends and Family: Twice a week    Attends Religious Services: Never    Database administrator or Organizations: No    Attends Banker Meetings: Never    Marital Status: Widowed  Allergies:  Allergies  Allergen Reactions   Plaquenil  [Hydroxychloroquine ] Other (See Comments)    Retinal damage   Penicillins Rash   Sulfa Antibiotics Nausea And Vomiting   Sulfamethoxazole-Trimethoprim Nausea And Vomiting    Metabolic Disorder Labs: Lab Results  Component Value Date   HGBA1C 5.7 (H) 05/08/2022   MPG 116.89 05/08/2022   No results found for: PROLACTIN Lab Results  Component Value Date   CHOL 172 03/17/2024   TRIG 59 03/17/2024   HDL 96 03/17/2024   CHOLHDL 1.8 03/17/2024   VLDL 24 05/08/2022   LDLCALC 64 03/17/2024   LDLCALC 97 02/18/2024   Lab Results  Component Value Date   TSH 1.886 10/02/2023   TSH 2.11 01/21/2023    Therapeutic Level Labs: No results found for: LITHIUM No results found for: VALPROATE Lab Results  Component Value Date   CBMZ <2.0 (L) 07/29/2023    Current Medications: Current Outpatient Medications  Medication Sig Dispense Refill   acyclovir  (ZOVIRAX ) 400 MG tablet Take 1 tablet (400 mg total) by mouth daily. 90 tablet 1   amLODipine  (NORVASC ) 2.5 MG tablet Take 1 tablet (2.5 mg total) by mouth at bedtime. CONTINUE 5 MG TABLETS IN MORNING 90  tablet 3   Cholecalciferol 125 MCG (5000 UT) capsule Take 5,000 Units by mouth daily.     clopidogrel  (PLAVIX ) 75 MG tablet Take 1 tablet (75 mg total) by mouth daily. 30 tablet 0   doxepin  (SINEQUAN ) 50 MG capsule Take 1 capsule (50 mg total) by mouth at bedtime. 30 capsule 2   gabapentin  (NEURONTIN ) 300 MG capsule Take 600 mg by mouth 2 (two) times daily.     hydrOXYzine  (VISTARIL ) 25 MG capsule Take 1 capsule (25 mg total) by mouth 3 (three) times daily as needed for anxiety. 30 capsule 0   pantoprazole  (PROTONIX ) 40 MG tablet Take 1 tablet (40 mg total) by mouth 2 (two) times daily before a meal. 180 tablet 3   pravastatin  (PRAVACHOL ) 20 MG tablet Take 1 tablet (20 mg total) by mouth 3 (three) times a week. 36 tablet 0   spironolactone  (ALDACTONE ) 25 MG tablet Take 0.5 tablets (12.5 mg total) by mouth daily. 45 tablet 3   telmisartan  (MICARDIS ) 40 MG tablet Take 1 tablet (40 mg total) by mouth daily. 30 tablet 3   tizanidine  (ZANAFLEX ) 2 MG capsule Take 4 mg by mouth at bedtime.     DULoxetine  (CYMBALTA ) 30 MG capsule Take 1 capsule (30 mg total) by mouth daily. 30 capsule 2   DULoxetine  (CYMBALTA ) 60 MG capsule Take 1 capsule (60 mg total) by mouth at bedtime. 30 capsule 2   Zoster Vaccine Adjuvanted (SHINGRIX ) injection Inject 0.5 mLs into the muscle once for 1 dose. 0.5 mL 1   Current Facility-Administered Medications  Medication Dose Route Frequency Provider Last Rate Last Admin   [START ON 10/02/2024] denosumab  (PROLIA ) injection 60 mg  60 mg Subcutaneous Once Joshua Debby CROME, MD         Musculoskeletal: Strength & Muscle Tone: within normal limits Gait & Station: normal Patient leans: N/A  Psychiatric Specialty Exam: Review of Systems  Blood pressure 134/72, pulse 96, height 5' 2 (1.575 m), weight 150 lb (68 kg).Body mass index is 27.44 kg/m.  General Appearance: Casual  Eye Contact:  Good  Speech:  Clear and Coherent  Volume:  Normal  Mood:  Anxious and Depressed   Affect:  Congruent  Thought Process:  Coherent  Orientation:  Full (Time, Place, and Person)  Thought Content: Logical   Suicidal Thoughts:  No  Homicidal Thoughts:  No  Memory:  Immediate;   Good Recent;   Good  Judgement:  Good  Insight:  Good  Psychomotor Activity:  Normal  Concentration:  Concentration: Good  Recall:  Good  Fund of Knowledge: Good  Language: Good  Akathisia:  No  Handed:  Right  AIMS (if indicated): not done  Assets:  Communication Skills Desire for Improvement  ADL's:  Intact  Cognition: WNL  Sleep:  Fair improving    Screenings: AUDIT    Flowsheet Row Admission (Discharged) from 04/21/2024 in [facility] BEHAVIORAL MEDICINE Office Visit from 04/02/2024 in [facility]  Alcohol Use Disorder Identification Test Final Score (AUDIT) 3 3    GAD-7    Flowsheet Row Counselor from 11/26/2023 in [facility] at [facility] Visit from 11/15/2021 in [facility]  Total GAD-7 Score 17 2   PHQ2-9    Flowsheet Row Telephone from 04/29/2024 in [place] [facility]PARTMENT Counselor from 11/26/2023 in [facility] at [facility] Visit from 10/29/2023 in [facility] [facility] [place] Office Visit from 08/28/2023 [facility] Clinical Support from 10/24/2022 in [facility]  PHQ-2 Total Score 5 5 0 5 0  PHQ-9 Total Score 17 19 -- 12 8   Flowsheet Row Admission (Discharged) from 04/21/2024 in [facility]IC[facility] Most recent reading at 04/23/2024  9:29 AM ED from 04/21/2024 in [facility] Most recent reading at 04/21/2024  4:47 AM ED from 04/20/2024 in [facility] Emergency Department at [facility] Most recent reading at 04/20/2024  9:21 PM  C-SSRS RISK CATEGORY No Risk No Risk No Risk      Assessment and Plan:  Andilynn Delavega 68 year old Caucasian female presents for medication management follow-up appointment.  She was recently hospitalized due to worsening depression and suicidal ideations.  Discontinued from Effexor  and initiated on Cymbalta  which she reports she has been tolerating well.  Denying any medication side effects at this point.  Does report lingering anxiety symptoms.  Initiated hydroxyzine  25 to 50 mg p.o. nightly as needed she was receptive to this plan.  Will follow-up 3 months for medication adherence/tolerability.  Continue Cymbalta  90 mg daily  Collaboration of Care: Collaboration of Care: Medication Management AEB continue Cymbalta  and hydroxyzine  as needed, consideration for individual therapy  Patient/Guardian was advised Release of Information must be obtained prior to any record release in order to collaborate their care with an outside provider. Patient/Guardian was advised if they have not already done so to contact the registration department to sign all necessary forms in order for us  to release information regarding their care.   Consent: Patient/Guardian gives verbal consent for treatment and assignment of benefits for services provided during this visit. Patient/Guardian expressed understanding and agreed to proceed.    Staci LOISE Kerns, NP 05/05/2024, 1:11 PM

## 2024-05-05 ENCOUNTER — Ambulatory Visit (INDEPENDENT_AMBULATORY_CARE_PROVIDER_SITE_OTHER): Admitting: Internal Medicine

## 2024-05-05 ENCOUNTER — Ambulatory Visit (INDEPENDENT_AMBULATORY_CARE_PROVIDER_SITE_OTHER)

## 2024-05-05 ENCOUNTER — Encounter: Payer: Self-pay | Admitting: Internal Medicine

## 2024-05-05 VITALS — BP 124/60 | HR 60 | Temp 98.2°F | Ht 62.0 in | Wt 149.4 lb

## 2024-05-05 VITALS — BP 124/60 | HR 60 | Ht 62.0 in | Wt 149.0 lb

## 2024-05-05 DIAGNOSIS — I10 Essential (primary) hypertension: Secondary | ICD-10-CM | POA: Diagnosis not present

## 2024-05-05 DIAGNOSIS — Z0000 Encounter for general adult medical examination without abnormal findings: Secondary | ICD-10-CM | POA: Diagnosis not present

## 2024-05-05 DIAGNOSIS — Z8739 Personal history of other diseases of the musculoskeletal system and connective tissue: Secondary | ICD-10-CM | POA: Insufficient documentation

## 2024-05-05 DIAGNOSIS — Z1231 Encounter for screening mammogram for malignant neoplasm of breast: Secondary | ICD-10-CM | POA: Insufficient documentation

## 2024-05-05 DIAGNOSIS — N1831 Chronic kidney disease, stage 3a: Secondary | ICD-10-CM

## 2024-05-05 DIAGNOSIS — Z23 Encounter for immunization: Secondary | ICD-10-CM | POA: Insufficient documentation

## 2024-05-05 MED ORDER — SHINGRIX 50 MCG/0.5ML IM SUSR: 0.5000 mL | Freq: Once | INTRAMUSCULAR | 1 refills | Status: AC

## 2024-05-05 NOTE — Progress Notes (Signed)
 Subjective:  Patient ID: Debbie Hatfield, female    DOB: 09/03/1956  Age: 68 y.o. MRN: [id number]  CC: Hypertension   HPI AYZA RIPOLL presents for f/up ----  Discussed the use of AI scribe software for clinical note transcription with the patient, who gave verbal consent to proceed.  History of Present Illness SHELITA STEPTOE is a 68 year old female who presents for medication management and follow-up.  She recently spent eight days in [facility] for a medication transition, discontinuing Effexor  and starting Cymbalta  at a dose of 90 mg in the morning. She also takes doxepin  at night and reports improvement in her condition since the transition.  Her sleep issues are being addressed with the addition of Vistaril  (Atarax ) 25 mg, though she is awaiting insurance approval for this medication. She is tolerating the current medication regimen well.  She has experienced a weight gain of three to four pounds. No chest pain, shortness of breath, dizziness, or lightheadedness. No gastrointestinal symptoms such as abdominal pain, nausea, vomiting, diarrhea, or constipation are present, and there are no symptoms related to her stage three kidney disease.  She is currently taking Prolia  for osteoporosis and inquires about the continued use of vitamin D. Her last mammogram was performed last year, and she does not believe she is due for another one yet.  She recalls a creatinine level of 1.39, which she attributes to dehydration.    Outpatient Medications Prior to Visit  Medication Sig Dispense Refill   acyclovir  (ZOVIRAX ) 400 MG tablet Take 1 tablet (400 mg total) by mouth daily. 90 tablet 1   amLODipine  (NORVASC ) 2.5 MG tablet Take 1 tablet (2.5 mg total) by mouth at bedtime. CONTINUE 5 MG TABLETS IN MORNING 90 tablet 3   Cholecalciferol 125 MCG (5000 UT) capsule Take 5,000 Units by mouth daily.     clopidogrel  (PLAVIX ) 75 MG tablet Take 1 tablet (75 mg total) by mouth daily. 30 tablet  0   doxepin  (SINEQUAN ) 50 MG capsule Take 1 capsule (50 mg total) by mouth at bedtime. 30 capsule 2   DULoxetine  (CYMBALTA ) 30 MG capsule Take 1 capsule (30 mg total) by mouth daily. 30 capsule 2   DULoxetine  (CYMBALTA ) 60 MG capsule Take 1 capsule (60 mg total) by mouth at bedtime. 30 capsule 2   gabapentin  (NEURONTIN ) 300 MG capsule Take 600 mg by mouth 2 (two) times daily.     hydrOXYzine  (VISTARIL ) 25 MG capsule Take 1 capsule (25 mg total) by mouth 3 (three) times daily as needed for anxiety. 30 capsule 0   pantoprazole  (PROTONIX ) 40 MG tablet Take 1 tablet (40 mg total) by mouth 2 (two) times daily before a meal. 180 tablet 3   pravastatin  (PRAVACHOL ) 20 MG tablet Take 1 tablet (20 mg total) by mouth 3 (three) times a week. 36 tablet 0   spironolactone  (ALDACTONE ) 25 MG tablet Take 0.5 tablets (12.5 mg total) by mouth daily. 45 tablet 3   telmisartan  (MICARDIS ) 40 MG tablet Take 1 tablet (40 mg total) by mouth daily. 30 tablet 3   tizanidine  (ZANAFLEX ) 2 MG capsule Take 4 mg by mouth at bedtime.     Facility-Administered Medications Prior to Visit  Medication Dose Route Frequency Provider Last Rate Last Admin   [START ON 10/02/2024] denosumab  (PROLIA ) injection 60 mg  60 mg Subcutaneous Once Joshua Debby CROME, MD        ROS Review of Systems  Objective:  BP 124/60 (BP Location: Left Arm,  Patient Position: Sitting, Cuff Size: Normal)   Pulse 60   Temp 98.2 F (36.8 C) (Oral)   Ht 5' 2 (1.575 m)   Wt 149 lb 6.4 oz (67.8 kg)   SpO2 99%   BMI 27.33 kg/m   BP Readings from Last 3 Encounters:  05/05/24 124/60  05/05/24 124/60  04/29/24 (!) 147/64    Wt Readings from Last 3 Encounters:  05/05/24 149 lb (67.6 kg)  05/05/24 149 lb 6.4 oz (67.8 kg)  04/29/24 148 lb 9.6 oz (67.4 kg)    Physical Exam Cardiovascular:     Rate and Rhythm: Normal rate and regular rhythm.     Heart sounds: Murmur heard.     Systolic murmur is present with a grade of 2/6.     No friction rub. No  gallop.  Musculoskeletal:     Right lower leg: No edema.     Left lower leg: No edema.     Lab Results  Component Value Date   WBC 12.0 (H) 04/20/2024   HGB 12.2 04/20/2024   HCT 39.0 04/20/2024   PLT 407 (H) 04/20/2024   GLUCOSE 144 (H) 04/20/2024   CHOL 172 03/17/2024   TRIG 59 03/17/2024   HDL 96 03/17/2024   LDLCALC 64 03/17/2024   ALT 17 03/17/2024   AST 25 03/17/2024   NA 140 04/20/2024   K 4.5 04/20/2024   CL 110 04/20/2024   CREATININE 1.29 (H) 04/20/2024   BUN 24 (H) 04/20/2024   CO2 23 04/20/2024   TSH 1.886 10/02/2023   INR 1.1 (H) 07/10/2023   HGBA1C 5.7 (H) 05/08/2022    No results found.  Assessment & Plan:  Screening mammogram for breast cancer -     Digital Screening Mammogram, Left and Right; Future  Need for prophylactic vaccination and inoculation against varicella -     Shingrix ; Inject 0.5 mLs into the muscle once for 1 dose.  Dispense: 0.5 mL; Refill: 1  Primary hypertension  Stage 3a chronic kidney disease (HCC)     Follow-up: Return in about 4 months (around 09/05/2024).  Debby Molt, MD

## 2024-05-05 NOTE — Patient Instructions (Signed)
 Chronic Kidney Disease in Adults: What to Know Chronic kidney disease (CKD) is when lasting damage happens to the kidneys slowly over time. The kidneys are two organs that do many important things in the body. These include: Taking waste and extra fluid out of the blood to make pee (urine). Making hormones. Keeping the right amount of fluids and chemicals in the body. A small amount of kidney damage may not cause problems. You must take steps to help keep the kidney damage from getting worse. A lot of damage may cause kidney failure. Kidney failure means the kidneys can no longer work right. What are the causes? Diabetes. High blood pressure. Diseases that affect the heart and blood vessels. Other kidney diseases. Diseases that affect the body's defense system (immune system). A problem with the flow of pee. This may be caused by: Kidney stones. Cancer. An enlarged prostate, in males. A kidney infection or urinary tract infection (UTI) that keeps coming back. What increases the risk? Getting older. The chances of having CKD increase with age. A family history of kidney disease or kidney failure. Having a disease caused by genes. Taking medicines that can harm the kidneys. Being near or having contact with harmful substances. Being very overweight. Using tobacco now or in the past. What are the signs or symptoms? Common symptoms of CKD include: Feeling very tired and having less energy. Swelling of the face, legs, ankles, or feet. Throwing up or feeling like you may throw up. Not wanting to eat as much as normal. Being confused or not able to focus. Twitches and cramps in the leg muscles or other muscles. Dry, itchy skin. Other symptoms may include: Shortness of breath. Trouble sleeping. Making less pee, or making more pee, especially at night. A taste of metal in your mouth. You may also become anemic. Anemia means there's not enough red blood cells in your blood. You may get  symptoms slowly. You may not notice them until the kidney damage gets very bad. How is this diagnosed? CKD may be diagnosed based on: Tests on your blood or pee. Imaging tests, like an ultrasound or a CT scan. A kidney biopsy. For this test, a sample of kidney tissue is removed to be looked at under a microscope. These tests will help to find out how serious the CKD is. How is this treated? Often, there's no cure for CKD. Treatment can help with symptoms and help keep the disease from getting worse. Treatment may include: Treating other problems that are causing your CKD or making it worse. Diet changes. You may need to: Avoid alcohol. Avoid foods that are high in salt, potassium, phosphorous, and protein. Taking medicines for symptoms and to help control other conditions. Dialysis. This treatment gets harmful waste out of your body. It may be needed if you have kidney failure. Follow these instructions at home: Medicines Take your medicines only as told. The amount of some medicines you take may need to be changed. Do not take any new medicines, vitamins, or supplements unless your health care provider says it's okay. These may make kidney damage worse. Lifestyle Do not smoke, vape, or use nicotine or tobacco. If you drink alcohol: Limit how much you have to: 0-1 drink a day if you're female. 0-2 drinks a day if you're female. Know how much alcohol is in your drink. In the [place] one drink is one 12 oz bottle of beer (355 mL), one 5 oz glass of wine (148 mL), or one 1 oz  glass of hard liquor (44 mL). Stay at a healthy weight. If you need help, ask your provider. General instructions  Eat and drink as told. Track your blood pressure at home. Tell your provider about any changes. If you have diabetes, track your blood sugar as told. Exercise at least 30 minutes a day, 5 days a week. Keep your shots (vaccinations) up to date. Keep all follow-up visits. Your provider may need to change  your treatments over time. Where to find support [employer] Kid[employer]: [place].com.[employer] Kidney School: kidneyschool.org American Association of Kidney Patients: https://www.miller-montoya.com/ Where to find more information National Kidney Foundation: kidney.org Centers for Disease Control and Prevention. To learn more: Go to DiningCalendar.de. Click "Search". Type "chronic kidney disease" in the search box. Contact a health care provider if: You have new symptoms. You get symptoms of end-stage kidney disease. These include: Headaches. Numbness in your hands or feet. Leg cramps. Easy bruising. Get help right away if: You have a fever. You make less pee than usual. You have pain or bleeding when you pee or poop. You have chest pain. You have shortness of breath. These symptoms may be an emergency. Call 911 right away. Do not wait to see if the symptoms will go away. Do not drive yourself to the hospital. This information is not intended to replace advice given to you by your health care provider. Make sure you discuss any questions you have with your health care provider. Document Revised: 08/13/2023 Document Reviewed: 04/05/2023 Elsevier Patient Education  2024 [employer].

## 2024-05-05 NOTE — Patient Instructions (Signed)
 Ms. Debbie Hatfield , Thank you for taking time out of your busy schedule to complete your Annual Wellness Visit with me. I enjoyed our conversation and look forward to speaking with you again next year. I, as well as your care team,  appreciate your ongoing commitment to your health goals. Please review the following plan we discussed and let me know if I can assist you in the future. Your Game plan/ To Do List    Referrals: If you haven't heard from the office you've been referred to, please reach out to them at the phone provided.  Mammogram ordered by PCP today Follow up Visits: Next Medicare AWV with our clinical staff: 05/13/2025   Have you seen your provider in the last 6 months (3 months if uncontrolled diabetes)? No Next Office Visit with your provider: 09/16/2024  Clinician Recommendations:  Aim for 30 minutes of exercise or brisk walking, 6-8 glasses of water, and 5 servings of fruits and vegetables each day. Educated and advised on getting the Shingles vaccines in 2025.      This is a list of the screening recommended for you and due dates:  Health Maintenance  Topic Date Due   Zoster (Shingles) Vaccine (1 of 2) Never done   COVID-19 Vaccine (3 - Moderna risk series) 09/30/2020   Mammogram  12/27/2023   Flu Shot  05/15/2024   Medicare Annual Wellness Visit  05/05/2025   Colon Cancer Screening  07/08/2030   DTaP/Tdap/Td vaccine (3 - Td or Tdap) 12/30/2030   Pneumococcal Vaccine for age over 71  Completed   DEXA scan (bone density measurement)  Completed   Hepatitis C Screening  Completed   Hepatitis B Vaccine  Aged Out   HPV Vaccine  Aged Out   Meningitis B Vaccine  Aged Out    Advanced directives: (Declined) Advance directive discussed with you today. Even though you declined this today, please call our office should you change your mind, and we can give you the proper paperwork for you to fill out. Advance Care Planning is important because it:  [x]  Makes sure you receive the  medical care that is consistent with your values, goals, and preferences  [x]  It provides guidance to your family and loved ones and reduces their decisional burden about whether or not they are making the right decisions based on your wishes.  Follow the link provided in your after visit summary or read over the paperwork we have mailed to you to help you started getting your Advance Directives in place. If you need assistance in completing these, please reach out to us  so that we can help you!

## 2024-05-05 NOTE — Progress Notes (Signed)
 Subjective:   Debbie Hatfield is a 68 y.o. who presents for a Medicare Wellness preventive visit.  As a reminder, Annual Wellness Visits don't include a physical exam, and some assessments may be limited, especially if this visit is performed virtually. We may recommend an in-person follow-up visit with your provider if needed.  Visit Complete: In person  Persons Participating in Visit: Patient.  AWV Questionnaire: Yes: Patient Medicare AWV questionnaire was completed by the patient on 05/04/2024; I have confirmed that all information answered by patient is correct and no changes since this date.  Cardiac Risk Factors include: hypertension;advanced age (>26men, >48 women)     Objective:    Today's Vitals   05/05/24 1113  BP: 124/60  Pulse: 60  SpO2: 99%  Weight: 149 lb (67.6 kg)  Height: 5' 2 (1.575 m)   Body mass index is 27.25 kg/m.     05/05/2024   11:13 AM 04/23/2024    6:47 PM 04/21/2024    9:16 AM 04/20/2024    9:21 PM 10/03/2023   12:52 PM 10/03/2023    9:41 AM 10/03/2023    7:51 AM  Advanced Directives  Does Patient Have a Medical Advance Directive? No   No Yes Yes Yes  Type of Agricultural consultant;Living will Healthcare Power of [place];Living will Healthcare Power of [place];Living will  Does patient want to make changes to medical advance directive?     No - Patient declined Yes (Inpatient - patient defers changing a medical advance directive and declines information at this time)   Copy of Healthcare Power of Attorney in Chart?     No - copy requested No - copy requested   Would patient like information on creating a medical advance directive? No - Patient declined   No - Patient declined        Information is confidential and restricted. Go to Review Flowsheets to unlock data.    Current Medications (verified) Outpatient Encounter Medications as of 05/05/2024  Medication Sig   acyclovir  (ZOVIRAX ) 400 MG tablet Take 1 tablet  (400 mg total) by mouth daily.   amLODipine  (NORVASC ) 2.5 MG tablet Take 1 tablet (2.5 mg total) by mouth at bedtime. CONTINUE 5 MG TABLETS IN MORNING   Cholecalciferol 125 MCG (5000 UT) capsule Take 5,000 Units by mouth daily.   clopidogrel  (PLAVIX ) 75 MG tablet Take 1 tablet (75 mg total) by mouth daily.   doxepin  (SINEQUAN ) 50 MG capsule Take 1 capsule (50 mg total) by mouth at bedtime.   DULoxetine  (CYMBALTA ) 30 MG capsule Take 1 capsule (30 mg total) by mouth daily.   DULoxetine  (CYMBALTA ) 60 MG capsule Take 1 capsule (60 mg total) by mouth at bedtime.   gabapentin  (NEURONTIN ) 300 MG capsule Take 600 mg by mouth 2 (two) times daily.   hydrOXYzine  (VISTARIL ) 25 MG capsule Take 1 capsule (25 mg total) by mouth 3 (three) times daily as needed for anxiety.   pantoprazole  (PROTONIX ) 40 MG tablet Take 1 tablet (40 mg total) by mouth 2 (two) times daily before a meal.   pravastatin  (PRAVACHOL ) 20 MG tablet Take 1 tablet (20 mg total) by mouth 3 (three) times a week.   spironolactone  (ALDACTONE ) 25 MG tablet Take 0.5 tablets (12.5 mg total) by mouth daily.   telmisartan  (MICARDIS ) 40 MG tablet Take 1 tablet (40 mg total) by mouth daily.   tizanidine  (ZANAFLEX ) 2 MG capsule Take 4 mg by mouth at bedtime.  Zoster Vaccine Adjuvanted (SHINGRIX ) injection Inject 0.5 mLs into the muscle once for 1 dose.   Facility-Administered Encounter Medications as of 05/05/2024  Medication   [START ON 10/02/2024] denosumab  (PROLIA ) injection 60 mg    Allergies (verified) Plaquenil  [hydroxychloroquine ], Penicillins, Sulfa antibiotics, and Sulfamethoxazole-trimethoprim   History: Past Medical History:  Diagnosis Date   AKI (acute kidney injury) (HCC) 10/03/2023   Cancer (HCC) 03/30/2024   sqamous cell ca lip and back   Closed fracture of right distal radius    Degeneration of lumbar intervertebral disc 01/26/2020   Depression with anxiety 10/03/2023   GAD (generalized anxiety disorder) 11/15/2021    Gastroparesis    GERD (gastroesophageal reflux disease)    History of CVA (cerebrovascular accident) 10/03/2023   Hyperlipidemia LDL goal <100 03/26/2022   The ASCVD Risk score (Arnett DK, et al., 2019) failed to calculate for the following reasons:    The valid HDL cholesterol range is 20 to 100 mg/dL     Hypertension    Hypotension 10/03/2023   Irritable bowel disease    Lupus    Major depression    Morton's neuroma of left foot 08/30/2023   Near syncope 10/03/2023   Need for shingles vaccine 11/15/2021   Need for vaccination 11/15/2021   Nonrheumatic mitral valve regurgitation 05/10/2022   Osteopenia 12/14/2015   Peptic ulcer disease 12/14/2015   Recurrent major depressive disorder, in partial remission (HCC) 11/15/2021   Sinus bradycardia 10/03/2023   Stage 3a chronic kidney disease (HCC) 11/15/2021   Estimated Creatinine Clearance: 49.7 mL/min (by C-G formula based on SCr of 1.01 mg/dL).      Estimated Creatinine Clearance: 45.9 mL/min (A) (by C-G formula based on SCr of 1.07 mg/dL (H)).      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   Stroke [facility])    Trigeminal neuralgia    Visit for screening mammogram 11/15/2021   Past Surgical History:  Procedure Laterality Date   BUBBLE STUDY  07/05/2022   Procedure: BUBBLE STUDY;  Surgeon: Loni Soyla LABOR, MD;  Location: [facility] ENDOSCOPY;  Service: Cardiology;;   CARPAL TUNNEL RELEASE Left 05/15/2023   CHOLECYSTECTOMY     COLONOSCOPY     LOOP RECORDER IMPLANT  2021   ORIF WRIST FRACTURE Right 09/22/2020   Procedure: OPEN REDUCTION INTERNAL FIXATION (ORIF) WRIST FRACTURE;  Surgeon: Carolee Lynwood JINNY DOUGLAS, MD;  Location: [facility];  Service: Orthopedics;  Laterality: Right;    TEE WITHOUT CARDIOVERSION N/A 07/05/2022   Procedure: TRANSESOPHAGEAL ECHOCARDIOGRAM (TEE);  Surgeon: Loni Soyla LABOR, MD;  Location: [facility] ENDOSCOPY;  Service: Cardiology;  Laterality: N/A;   UPPER GASTROINTESTINAL ENDOSCOPY     Family History   Problem Relation Age of Onset   Heart block Mother        Pacemaker   Hypertension Mother    Depression Mother    Gout Mother    Macular degeneration Mother    Aneurysm Mother    Alcoholism Father    Heart failure Maternal Grandmother    Hypertension Maternal Grandmother    Congestive Heart Failure Maternal Grandmother    Diabetes Maternal Grandmother    Congestive Heart Failure Maternal Grandfather    Colon cancer Maternal Grandfather    Stroke Paternal Grandfather    Hypertension Paternal Grandfather    Esophageal cancer Neg Hx    Stomach cancer Neg Hx    Rectal cancer Neg Hx    Social History   Socioeconomic History   Marital status: Widowed    Spouse name:  Not on file   Number of children: 0   Years of education: Not on file   Highest education level: Bachelor's degree (e.g., BA, AB, BS)  Occupational History    Comment: RN ortho/neuro at [employer]  Tobacco Use   Smoking status: Never    Passive exposure: Past   Smokeless tobacco: Never  Vaping Use   Vaping status: Never Used  Substance and Sexual Activity   Alcohol use: Yes    Comment: social   Drug use: Never   Sexual activity: Not Currently    Birth control/protection: Post-menopausal  Other Topics Concern   Not on file  Social History Narrative   06/13/22 living with her mother   2 [place] Dew per Week   Social Drivers of Health   Financial Resource Strain: Low Risk  (05/05/2024)   Overall Financial Resource Strain (CARDIA)    Difficulty of Paying Living Expenses: Not very hard  Food Insecurity: No Food Insecurity (05/05/2024)   Hunger Vital Sign    Worried About Running Out of Food in the Last Year: Never true    Ran Out of Food in the Last Year: Never true  Transportation Needs: No Transportation Needs (05/05/2024)   PRAPARE - Administrator, Civil Service (Medical): No    Lack of Transportation (Non-Medical): No  Physical Activity: Sufficiently Active (05/05/2024)   Exercise Vital Sign     Days of Exercise per Week: 7 days    Minutes of Exercise per Session: 60 min  Stress: Stress Concern Present (05/05/2024)   [employer] of Occupational Health - Occupational Stress Questionnaire    Feeling of Stress: To some extent  Social Connections: Socially Isolated (05/05/2024)   Social Connection and Isolation Panel    Frequency of Communication with Friends and Family: More than three times a week    Frequency of Social Gatherings with Friends and Family: Twice a week    Attends Religious Services: Never    Database administrator or Organizations: No    Attends Banker Meetings: Never    Marital Status: Widowed    Tobacco Counseling Counseling given: No    Clinical Intake:  Pre-visit preparation completed: Yes  Pain : No/denies pain     BMI - recorded: 27.25 Nutritional Status: BMI 25 -29 Overweight Nutritional Risks: None Diabetes: No  Lab Results  Component Value Date   HGBA1C 5.7 (H) 05/08/2022     How often do you need to have someone help you when you read instructions, pamphlets, or other written materials from your doctor or pharmacy?: 1 - Never  Interpreter Needed?: No  Information entered by :: Debbie Hatfield Saba, CMA   Activities of Daily Living     05/05/2024   11:01 AM 05/04/2024    3:32 PM  In your present state of health, do you have any difficulty performing the following activities:  Hearing? 0 0  Vision? 1 1  Difficulty concentrating or making decisions? 1 1  Walking or climbing stairs? 1 1  Dressing or bathing? 0 0  Doing errands, shopping? 0 0  Preparing Food and eating ? N N  Using the Toilet? N N  In the past six months, have you accidently leaked urine? N N  Do you have problems with loss of bowel control? N N  Managing your Medications? N N  Managing your Finances? N N  Housekeeping or managing your Housekeeping? N N    Patient Care Team: Joshua Debby CROME, MD as  PCP - General (Internal Medicine) Raford Riggs, MD as PCP - Cardiology (Cardiology) Merceda Lela SAUNDERS, [facility] (Pharmacist) Tousey, Laine M, RN as [facility] Robinson Idol, MD as Consulting Physician (Ophthalmology)  I have updated your Care Teams any recent Medical Services you may have received from other providers in the past year.     Assessment:   This is a routine wellness examination for Debbie Hatfield.  Hearing/Vision screen Hearing Screening - Comments:: Denies hearing difficulties   Vision Screening - Comments:: Wears rx glasses - up to date with routine eye exams with Dr Robinson   Goals Addressed               This Visit's Progress     Patient Stated (pt-stated)        Patient stated that since checking out of the Behavioral Health due to feeling suicidal, she's wanting to get back on track with life and continue counseling       Depression Screen     05/05/2024   11:02 AM 04/29/2024   11:46 AM 11/26/2023    1:39 PM 10/29/2023    1:55 PM 08/28/2023   11:08 AM 10/24/2022    3:10 PM 10/22/2022   10:18 AM  PHQ 2/9 Scores  PHQ - 2 Score 4 5  0  0 0  PHQ- 9 Score 16 17    8 8      Information is confidential and restricted. Go to Review Flowsheets to unlock data.    Fall Risk     05/05/2024   11:01 AM 05/04/2024    3:32 PM 10/29/2023    1:55 PM 10/24/2022    3:09 PM 05/10/2022    2:05 PM  Fall Risk   Falls in the past year? 0 0 0 0 1  Number falls in past yr: 0 0 0 0 1  Injury with Fall? 0 0 0 0 0  Risk for fall due to : No Fall Risks  No Fall Risks Impaired mobility;Impaired balance/gait   Follow up Falls evaluation completed;Falls prevention discussed  Falls evaluation completed Falls evaluation completed;Education provided;Falls prevention discussed       Data saved with a previous flowsheet row definition    MEDICARE RISK AT HOME:  Medicare Risk at Home Any stairs in or around the home?: No If so, are there any without handrails?: No Home free of loose throw rugs in walkways, pet beds,  electrical cords, etc?: Yes Adequate lighting in your home to reduce risk of falls?: Yes Life alert?: Yes Use of a cane, walker or w/c?: No Grab bars in the bathroom?: Yes Shower chair or bench in shower?: No Elevated toilet seat or a handicapped toilet?: Yes  TIMED UP AND GO:  Was the test performed?  No  Cognitive Function: 6CIT completed      08/07/2023   11:17 AM  Montreal Cognitive Assessment   Visuospatial/ Executive (0/5) 4  Naming (0/3) 3  Attention: Read list of digits (0/2) 2  Attention: Read list of letters (0/1) 1  Attention: Serial 7 subtraction starting at 100 (0/3) 1  Language: Repeat phrase (0/2) 1  Language : Fluency (0/1) 1  Abstraction (0/2) 2  Delayed Recall (0/5) 3  Orientation (0/6) 6  Total 24  Adjusted Score (based on education) 24      05/05/2024   11:16 AM 10/24/2022    3:11 PM  6CIT Screen  What Year? 0 points 0 points  What month? 0 points 0 points  What time? 0 points 0 points  Count back from 20 0 points 0 points  Months in reverse 0 points 0 points  Repeat phrase 0 points 0 points  Total Score 0 points 0 points    Immunizations Immunization History  Administered Date(s) Administered   Influenza,inj,Quad PF,6-35 Mos 06/17/2019   Influenza-Unspecified 09/02/2020, 07/27/2021, 07/15/2022   Moderna SARS-COV2 Booster Vaccination 09/02/2020   Moderna Sars-Covid-2 Vaccination 10/23/2019, 11/20/2019   PNEUMOCOCCAL CONJUGATE-20 11/15/2021   Tdap 03/28/2014, 12/29/2020    Screening Tests Health Maintenance  Topic Date Due   Zoster Vaccines- Shingrix  (1 of 2) Never done   COVID-19 Vaccine (3 - Moderna risk series) 09/30/2020   MAMMOGRAM  12/27/2023   INFLUENZA VACCINE  05/15/2024   Medicare Annual Wellness (AWV)  05/05/2025   Colonoscopy  07/08/2030   DTaP/Tdap/Td (3 - Td or Tdap) 12/30/2030   Pneumococcal Vaccine: 50+ Years  Completed   DEXA SCAN  Completed   Hepatitis C Screening  Completed   Hepatitis B Vaccines  Aged Out   HPV  VACCINES  Aged Out   Meningococcal B Vaccine  Aged Out    Health Maintenance  Health Maintenance Due  Topic Date Due   Zoster Vaccines- Shingrix  (1 of 2) Never done   COVID-19 Vaccine (3 - Moderna risk series) 09/30/2020   MAMMOGRAM  12/27/2023   Health Maintenance Items Addressed: Mammogram ordered,    Patient will continue to get Counseling and Tx post Suicidal thoughts and discharge from the [facility] Dept w/Psychiatrist.  Additional Screening:  Vision Screening: Recommended annual ophthalmology exams for early detection of glaucoma and other disorders of the eye. Would you like a referral to an eye doctor? No  Patient has seen Dr Robinson in early 2025 for an eye exam.  Dental Screening: Recommended annual dental exams for proper oral hygiene  Community Resource Referral / Chronic Care Management: CRR required this visit?  No   CCM required this visit?  No   Plan:    I have personally reviewed and noted the following in the patient's chart:   Medical and social history Use of alcohol, tobacco or illicit drugs  Current medications and supplements including opioid prescriptions. Patient is not currently taking opioid prescriptions. Functional ability and status Nutritional status Physical activity Advanced directives List of other physicians Hospitalizations, surgeries, and ER visits in previous 12 months Vitals Screenings to include cognitive, depression, and falls Referrals and appointments  In addition, I have reviewed and discussed with patient certain preventive protocols, quality metrics, and best practice recommendations. A written personalized care plan for preventive services as well as general preventive health recommendations were provided to patient.   Debbie Hatfield CHRISTELLA Saba, CMA   05/05/2024   After Visit Summary: (In Person-Declined) Patient declined AVS at this time.  Notes: Patient will continue to get Counseling and Tx post Suicidal thoughts  and discharge from the [facility] Dept w/Psychiatrist.  Scheduled a f/u w/PCP for 09/16/2024.

## 2024-05-06 ENCOUNTER — Telehealth: Payer: Self-pay | Admitting: *Deleted

## 2024-05-06 ENCOUNTER — Encounter: Payer: Self-pay | Admitting: *Deleted

## 2024-05-07 ENCOUNTER — Other Ambulatory Visit: Payer: Self-pay | Admitting: *Deleted

## 2024-05-07 NOTE — Patient Instructions (Signed)
 Visit Information  Thank you for taking time to visit with me today. Please don't hesitate to contact me if I can be of assistance to you before our next scheduled telephone appointment.  Our next appointment is by telephone on Friday 05/15/24 at 10:00 am  Please call the care guide team at [phone] if you need to cancel or reschedule your appointment.   Following are the goals we discussed today:  Patient Self Care Activities:  Attend all scheduled provider appointments Call pharmacy for medication refills 3-7 days in advance of running out of medications Call provider office for new concerns or questions  Notify RN Care Manager of TOC call rescheduling needs Participate in Transition of Care Program/Attend TOC scheduled calls Perform all self care activities independently  Take medications as prescribed   Continue your visits with your Behavioral Health Counselor and Psychiatrist If you begin to have thoughts of hurting or harming yourself, please call the resources listed below immediately- do not wait If you believe your condition is getting worse- contact your care providers (doctors) promptly- reaching out to your doctor early when you have concerns can prevent you from having to go to the hospital  If you are experiencing a Mental Health or Behavioral Health Crisis or need someone to talk to, please  call the Suicide and Crisis Lifeline: 988 call the [place]  National Suicide Prevention Lifeline: (618)072-1134 or TTY: (814) 752-0443 TTY 2813144534) to talk to a trained counselor call 1-800-273-TALK (toll free, 24 hour hotline) go to [facility] [address] [place] [phone]) call the [facility] Crisis Line: (754)423-7983 call 911   Patient verbalizes understanding of instructions and care plan provided today and agrees to view in MyChart. Active MyChart status and patient understanding of how to access instructions and care  plan via MyChart confirmed with patient.     Nicholson Starace Mckinney Humna Moorehouse, RN, BSN, Media planner  Transitions of Care  [facility] [phone]: direct office

## 2024-05-07 NOTE — Transitions of Care (Post Inpatient/ED Visit) (Signed)
 Transition of Care week 2/ day # 7  Visit Note  05/07/2024  Name: Debbie Hatfield MRN: [id number]          DOB: 01/03/1956  Situation: Patient enrolled in [facility] 30-day program. Visit completed with patient by telephone.   HIPAA identifiers x 2 verified  Background:  Recent hospital admission from July 7-16, 2025 for major depressive disorder with suicidal ideation (2) unplanned hospital admissions x last 12 months; (1) x last 6 months  Initial Transition Care Management Follow-up Telephone Call    Past Medical History:  Diagnosis Date   AKI (acute kidney injury) (HCC) 10/03/2023   Cancer (HCC) 03/30/2024   sqamous cell ca lip and back   Closed fracture of right distal radius    Degeneration of lumbar intervertebral disc 01/26/2020   Depression with anxiety 10/03/2023   GAD (generalized anxiety disorder) 11/15/2021   Gastroparesis    GERD (gastroesophageal reflux disease)    History of CVA (cerebrovascular accident) 10/03/2023   Hyperlipidemia LDL goal <100 03/26/2022   The ASCVD Risk score (Arnett DK, et al., 2019) failed to calculate for the following reasons:    The valid HDL cholesterol range is 20 to 100 mg/dL     Hypertension    Hypotension 10/03/2023   Irritable bowel disease    Lupus    Major depression    Morton's neuroma of left foot 08/30/2023   Near syncope 10/03/2023   Need for shingles vaccine 11/15/2021   Need for vaccination 11/15/2021   Nonrheumatic mitral valve regurgitation 05/10/2022   Osteopenia 12/14/2015   Peptic ulcer disease 12/14/2015   Recurrent major depressive disorder, in partial remission (HCC) 11/15/2021   Sinus bradycardia 10/03/2023   Stage 3a chronic kidney disease (HCC) 11/15/2021   Estimated Creatinine Clearance: 49.7 mL/min (by C-G formula based on SCr of 1.01 mg/dL).      Estimated Creatinine Clearance: 45.9 mL/min (A) (by C-G formula based on SCr of 1.07 mg/dL (H)).      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   Stroke [facility])     Trigeminal neuralgia    Visit for screening mammogram 11/15/2021   Assessment:  I am doing great, feeling much better about everything; went to see the psychiatrist, the counselor, and my PCP and all is well.  No changes to my medications; having no thoughts of hurting or harming myself.  I am retired Engineer, civil (consulting).  I am independent and think everything is under control.  I have another appointment with the counselor next week.  My blood pressures at home are great; not having any concerns at all   Denies clinical concerns and sounds to be in no distress throughout [facility] 30-day program outreach call today  Patient Reported Symptoms: Cognitive Cognitive Status: Alert and oriented to person, place, and time, Normal speech and language skills, Insightful and able to interpret abstract concepts Cognitive/Intellectual Conditions Management [RPT]: None reported or documented in medical history or problem list      Neurological Neurological Review of Symptoms: No symptoms reported (History CVA: in 2023) Neurological Management Strategies: Coping strategies, Medication therapy, Routine screening  HEENT HEENT Symptoms Reported: No symptoms reported      Cardiovascular Cardiovascular Symptoms Reported: No symptoms reported Does patient have uncontrolled Hypertension?: No Cardiovascular Management Strategies: Routine screening, Medication therapy, Coping strategies  Respiratory Respiratory Symptoms Reported: No symptoms reported Other Respiratory Symptoms: Denies shortness of breath/ cough; sounds to be in no respiratory distress throughout [facility] call    Endocrine Endocrine Symptoms Reported: No symptoms  reported Is patient diabetic?: No    Gastrointestinal Gastrointestinal Symptoms Reported: No symptoms reported Additional Gastrointestinal Details: Reports good appetite      Genitourinary Genitourinary Symptoms Reported: No symptoms reported    Integumentary Integumentary Symptoms Reported: No  symptoms reported    Musculoskeletal Musculoskelatal Symptoms Reviewed: No symptoms reported        Psychosocial Psychosocial Symptoms Reported: No symptoms reported Additional Psychological Details: History of depression with SI: confirmed no thoughts of hurting/ harming self at present time; confirmed attended and reviewed post-hospital discharge psychiatric provider appointment on 05/04/24 and counselor appointment earlier this week; confirmed she is established with good reported rapport with counselor and psychiatric providers; confirmed patient has resources and is able to verbalize action plan should thoughts of hurting or harming herself occur; Encouraged her ongoing engagement and participation with psychiatric and counselor providers Behavioral Management Strategies: Adequate rest, Counseling, Community resources, Coping strategies, Complementary therapy(ies), Medication therapy, Support system Major Change/Loss/Stressor/Fears (CP): Denies Techniques to Cope with Loss/Stress/Change: Counseling, Diversional activities, Medication Quality of Family Relationships: supportive   Vitals:   05/07/24 0943  BP: (!) 140/74    Medications Reviewed Today     Reviewed by Tynell Winchell M, RN (Registered Nurse) on 05/07/24 at [phone]  Med List Status: <None>   Medication Order Taking? Sig Documenting Provider Last Dose Status Informant  acyclovir  (ZOVIRAX ) 400 MG tablet [id number]  Take 1 tablet (400 mg total) by mouth daily. Joshua Debby CROME, MD  Active   amLODipine  (NORVASC ) 2.5 MG tablet [id number]  Take 1 tablet (2.5 mg total) by mouth at bedtime. CONTINUE 5 MG TABLETS IN ERMALINDA Scarce, [place], MD  Active   Cholecalciferol 125 MCG (5000 UT) capsule [id number]  Take 5,000 Units by mouth daily. [provider]  Active   clopidogrel  (PLAVIX ) 75 MG tablet [id number]  Take 1 tablet (75 mg total) by mouth daily. Jadapalle, Sree, MD  Active   denosumab  (PROLIA ) injection 60 mg [id number]    Joshua Debby CROME, MD  Active   doxepin  (SINEQUAN ) 50 MG capsule [id number]  Take 1 capsule (50 mg total) by mouth at bedtime. Ezzard Staci SAILOR, NP  Active   DULoxetine  (CYMBALTA ) 30 MG capsule [id number]  Take 1 capsule (30 mg total) by mouth daily. Ezzard Staci SAILOR, NP  Active   DULoxetine  (CYMBALTA ) 60 MG capsule [id number]  Take 1 capsule (60 mg total) by mouth at bedtime. Ezzard Staci SAILOR, NP  Active   gabapentin  (NEURONTIN ) 300 MG capsule [id number]  Take 600 mg by mouth 2 (two) times daily. [provider]  Active Self           Med Note ROLENE EVERN PULLING A   Wed Oct 02, 2023  9:19 PM)    hydrOXYzine  (VISTARIL ) 25 MG capsule [id number]  Take 1 capsule (25 mg total) by mouth 3 (three) times daily as needed for anxiety. Ezzard Staci SAILOR, NP  Active   pantoprazole  (PROTONIX ) 40 MG tablet [id number]  Take 1 tablet (40 mg total) by mouth 2 (two) times daily before a meal. Mansouraty, Aloha Raddle., MD  Active   pravastatin  (PRAVACHOL ) 20 MG tablet [id number]  Take 1 tablet (20 mg total) by mouth 3 (three) times a week. Vannie Reche RAMAN, NP  Active   spironolactone  (ALDACTONE ) 25 MG tablet [id number]  Take 0.5 tablets (12.5 mg total) by mouth daily. Vannie Reche RAMAN, NP  Active   telmisartan  (MICARDIS ) 40 MG tablet [id number]  Take 1 tablet (40 mg total) by mouth daily. Tobb,  Kardie, DO  Active   tizanidine  (ZANAFLEX ) 2 MG capsule [id number]  Take 4 mg by mouth at bedtime. [provider]  Active Self           Med Note JACKOLYN, [place] R   Tue Mar 17, 2024  7:58 AM)             Recommendation:   Specialty provider follow-up- as scheduled with established counselor provider next week Continue Current Plan of Care  Follow Up Plan:   Telephone follow-up in 1 week- as scheduled 05/15/24  Pls call/ message for questions,  Beatris Blinda Lawrence, RN, BSN, CCRN Alumnus RN Care Manager  Transitions of Care  [facility] - [facility] [phone]: direct  office

## 2024-05-12 ENCOUNTER — Telehealth (HOSPITAL_COMMUNITY): Payer: Self-pay

## 2024-05-12 ENCOUNTER — Encounter: Payer: Self-pay | Admitting: Internal Medicine

## 2024-05-12 ENCOUNTER — Ambulatory Visit: Admitting: Cardiology

## 2024-05-12 ENCOUNTER — Other Ambulatory Visit (HOSPITAL_COMMUNITY): Payer: Self-pay | Admitting: Family

## 2024-05-12 MED ORDER — DOXEPIN HCL 75 MG PO CAPS: 75.0000 mg | ORAL_CAPSULE | Freq: Every day | ORAL | 2 refills | Status: DC

## 2024-05-12 NOTE — Progress Notes (Signed)
 Spoke to patient regarding medication adjustment with Doxepin  50 mg, patient to start doxepin  75 mg nightly, and continue to monitor symptoms. Debbie Hatfield stated  I am worn out, reported interrupted sleep for the past 2 nights.

## 2024-05-12 NOTE — Telephone Encounter (Signed)
 Patient is calling to report she is taking the Doxepin  and the Hydroxyzine  and she is still not sleeping at night. She does not follow up with you until October. Please review and advise, thank you

## 2024-05-14 ENCOUNTER — Ambulatory Visit (INDEPENDENT_AMBULATORY_CARE_PROVIDER_SITE_OTHER)

## 2024-05-14 DIAGNOSIS — R55 Syncope and collapse: Secondary | ICD-10-CM | POA: Diagnosis not present

## 2024-05-14 LAB — CUP PACEART REMOTE DEVICE CHECK
Date Time Interrogation Session: 20250730231831
Implantable Pulse Generator Implant Date: 20220728

## 2024-05-15 ENCOUNTER — Encounter: Payer: Self-pay | Admitting: *Deleted

## 2024-05-15 ENCOUNTER — Telehealth: Payer: Self-pay | Admitting: *Deleted

## 2024-05-15 ENCOUNTER — Ambulatory Visit: Payer: Self-pay | Admitting: Cardiology

## 2024-05-18 ENCOUNTER — Encounter: Payer: Self-pay | Admitting: *Deleted

## 2024-05-19 ENCOUNTER — Other Ambulatory Visit: Admitting: *Deleted

## 2024-05-19 NOTE — Patient Instructions (Addendum)
 Visit Information  Thank you for taking time to visit with me today. Please don't hesitate to contact me if I can be of assistance to you before our next scheduled telephone appointment.  Our next appointment is by telephone on Friday 05/29/24 at 09:45 am  Please call the care guide team at [phone] if you need to cancel or reschedule your appointment.   Following are the goals we discussed today:  Attend all scheduled provider appointments Call pharmacy for medication refills 3-7 days in advance of running out of medications Call provider office for new concerns or questions  Notify RN Care Manager of TOC call rescheduling needs Participate in Transition of Care Program/Attend TOC scheduled calls Perform all self care activities independently  Take medications as prescribed   Continue your visits with your Behavioral Health Counselor and Psychiatrist If you begin to have thoughts of hurting or harming yourself, please call the resources listed below immediately- do not wait If you believe your condition is getting worse- contact your care providers (doctors) promptly- reaching out to your doctor early when you have concerns can prevent you from having to go to the hospital   If you are experiencing a Mental Health or Behavioral Health Crisis or need someone to talk to, please  call the Suicide and Crisis Lifeline: 988 call the [place]  National Suicide Prevention Lifeline: (475)547-8981 or TTY: (220)320-2927 TTY 209-088-5130) to talk to a trained counselor call 1-800-273-TALK (toll free, 24 hour hotline) go to [facility] [address] [place] [phone]) call the [facility] Crisis Line: 519-490-2725 call 911   Patient verbalizes understanding of instructions and care plan provided today and agrees to view in MyChart. Active MyChart status and patient understanding of how to access instructions and care plan via MyChart confirmed with  patient.     Debbie Hatfield Debbie Cicio, RN, BSN, Media planner  Transitions of Care  [facility] - [facility] [phone]: direct office

## 2024-05-19 NOTE — Transitions of Care (Post Inpatient/ED Visit) (Signed)
 Transition of Care week 4/ day # 20  Visit Note  05/19/2024  Name: Debbie Hatfield MRN: [id number]          DOB: 06/12/1956  Situation: Patient enrolled in [facility] 30-day program. Visit completed with patient by telephone.   HIPAA identifiers x 2 verified  Background:  Recent hospital admission from July 7-16, 2025 for major depressive disorder with suicidal ideation (2) unplanned hospital admissions x last 12 months; (1) x last 6 months  Initial Transition Care Management Follow-up Telephone Call    Past Medical History:  Diagnosis Date   AKI (acute kidney injury) (HCC) 10/03/2023   Cancer (HCC) 03/30/2024   sqamous cell ca lip and back   Closed fracture of right distal radius    Degeneration of lumbar intervertebral disc 01/26/2020   Depression with anxiety 10/03/2023   GAD (generalized anxiety disorder) 11/15/2021   Gastroparesis    GERD (gastroesophageal reflux disease)    History of CVA (cerebrovascular accident) 10/03/2023   Hyperlipidemia LDL goal <100 03/26/2022   The ASCVD Risk score (Arnett DK, et al., 2019) failed to calculate for the following reasons:    The valid HDL cholesterol range is 20 to 100 mg/dL     Hypertension    Hypotension 10/03/2023   Irritable bowel disease    Lupus    Major depression    Morton's neuroma of left foot 08/30/2023   Near syncope 10/03/2023   Need for shingles vaccine 11/15/2021   Need for vaccination 11/15/2021   Nonrheumatic mitral valve regurgitation 05/10/2022   Osteopenia 12/14/2015   Peptic ulcer disease 12/14/2015   Recurrent major depressive disorder, in partial remission (HCC) 11/15/2021   Sinus bradycardia 10/03/2023   Stage 3a chronic kidney disease (HCC) 11/15/2021   Estimated Creatinine Clearance: 49.7 mL/min (by C-G formula based on SCr of 1.01 mg/dL).      Estimated Creatinine Clearance: 45.9 mL/min (A) (by C-G formula based on SCr of 1.07 mg/dL (H)).      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   Stroke [facility])     Trigeminal neuralgia    Visit for screening mammogram 11/15/2021   Assessment:  I am still doing great, feeling so much better about everything; better than I have been in a long time; I don't even feel depressed at all.  I went to see the psychiatrist last week and I have been staying busy and doing arts and crafts and reading; going out to dinner tonight.  I will see the counselor soon; not sure exactly when that appointment is, but I will attend.    Denies clinical concerns and sounds to be in no distress throughout [facility] 30-day program outreach call today  Patient Reported Symptoms: Cognitive Cognitive Status: Normal speech and language skills, Alert and oriented to person, place, and time, Insightful and able to interpret abstract concepts      Neurological Neurological Review of Symptoms: No symptoms reported    HEENT HEENT Symptoms Reported: No symptoms reported      Cardiovascular Cardiovascular Symptoms Reported: No symptoms reported Does patient have uncontrolled Hypertension?: No Cardiovascular Management Strategies: Medication therapy, Routine screening, Coping strategies  Respiratory Respiratory Symptoms Reported: No symptoms reported Other Respiratory Symptoms: Denies shortness of breath; sounds to be in no respiratory distress throughout [facility] call    Endocrine Endocrine Symptoms Reported: No symptoms reported Is patient diabetic?: No    Gastrointestinal Gastrointestinal Symptoms Reported: No symptoms reported Additional Gastrointestinal Details: Reports ongoing good appetite; confirms having normal and regular BM's  Genitourinary Genitourinary Symptoms Reported: No symptoms reported    Integumentary Integumentary Symptoms Reported: No symptoms reported    Musculoskeletal Musculoskelatal Symptoms Reviewed: No symptoms reported Additional Musculoskeletal Details: confirmed not currently requiring/ using assistive devices for ambulation        Psychosocial  Psychosocial Symptoms Reported: No symptoms reported Additional Psychological Details: Confirmed no SI; reviewed recent in-person office visit with psychiatry provider last week; verbalizes very good understanding of post-visit instructions; confirmed remains active with counselor Darleene; confirmed has all necessary resources for mental health crisis Behavioral Management Strategies: Activity, Adequate rest, Coping strategies, Counseling, Medication therapy, Support system Major Change/Loss/Stressor/Fears (CP): Denies Techniques to Cope with Loss/Stress/Change: Diversional activities, Counseling, Medication Quality of Family Relationships: supportive, helpful Do you feel physically threatened by others?: No   There were no vitals filed for this visit.  Medications Reviewed Today     Reviewed by Thomas Rhude M, RN (Registered Nurse) on 05/19/24 at [phone]  Med List Status: <None>   Medication Order Taking? Sig Documenting Provider Last Dose Status Informant  acyclovir  (ZOVIRAX ) 400 MG tablet [id number]  Take 1 tablet (400 mg total) by mouth daily. Joshua Debby CROME, MD  Active   amLODipine  (NORVASC ) 2.5 MG tablet [id number]  Take 1 tablet (2.5 mg total) by mouth at bedtime. CONTINUE 5 MG TABLETS IN ERMALINDA Scarce, [place], MD  Expired 05/13/24 2359   Cholecalciferol 125 MCG (5000 UT) capsule [id number]  Take 5,000 Units by mouth daily. [provider]  Active   clopidogrel  (PLAVIX ) 75 MG tablet [id number]  Take 1 tablet (75 mg total) by mouth daily. Jadapalle, Sree, MD  Active   denosumab  (PROLIA ) injection 60 mg [id number]   Joshua Debby CROME, MD  Active   doxepin  (SINEQUAN ) 75 MG capsule [id number]  Take 1 capsule (75 mg total) by mouth at bedtime. Ezzard Staci SAILOR, NP  Active   DULoxetine  (CYMBALTA ) 30 MG capsule [id number]  Take 1 capsule (30 mg total) by mouth daily. Ezzard Staci SAILOR, NP  Active   DULoxetine  (CYMBALTA ) 60 MG capsule [id number]  Take 1 capsule (60 mg total) by mouth at bedtime.  Ezzard Staci SAILOR, NP  Active   gabapentin  (NEURONTIN ) 300 MG capsule [id number]  Take 600 mg by mouth 2 (two) times daily. [provider]  Active Self           Med Note ROLENE EVERN PULLING A   Wed Oct 02, 2023  9:19 PM)    hydrOXYzine  (VISTARIL ) 25 MG capsule [id number]  Take 1 capsule (25 mg total) by mouth 3 (three) times daily as needed for anxiety. Ezzard Staci SAILOR, NP  Active   pantoprazole  (PROTONIX ) 40 MG tablet [id number]  Take 1 tablet (40 mg total) by mouth 2 (two) times daily before a meal. Mansouraty, Aloha Raddle., MD  Active   pravastatin  (PRAVACHOL ) 20 MG tablet [id number]  Take 1 tablet (20 mg total) by mouth 3 (three) times a week. Vannie Reche RAMAN, NP  Active   spironolactone  (ALDACTONE ) 25 MG tablet [id number]  Take 0.5 tablets (12.5 mg total) by mouth daily. Vannie Reche RAMAN, NP  Active   telmisartan  (MICARDIS ) 40 MG tablet [id number]  Take 1 tablet (40 mg total) by mouth daily. Tobb, Kardie, DO  Active   tizanidine  (ZANAFLEX ) 2 MG capsule [id number]  Take 4 mg by mouth at bedtime. [provider]  Active Self           Med Note JACKOLYN APOLINAR SAUNDERS   Tue Mar 17, 2024  7:58  AM)             Recommendation:   Specialty provider follow-up: with psychiatry and behavioral health counselor- as scheduled Continue Current Plan of Care  Follow Up Plan:   Telephone follow-up in 1 week- as scheduled  Pls call/ message for questions,  Rosemae Mcquown Mckinney Siarah Deleo, RN, BSN, CCRN Alumnus RN Care Manager  Transitions of Care  [facility] - [facility] [phone]: direct office

## 2024-05-22 ENCOUNTER — Ambulatory Visit: Payer: Medicare Other

## 2024-05-28 ENCOUNTER — Ambulatory Visit (INDEPENDENT_AMBULATORY_CARE_PROVIDER_SITE_OTHER): Admitting: Dermatology

## 2024-05-28 ENCOUNTER — Encounter: Payer: Self-pay | Admitting: Dermatology

## 2024-05-28 VITALS — BP 134/75 | HR 96

## 2024-05-28 DIAGNOSIS — I781 Nevus, non-neoplastic: Secondary | ICD-10-CM | POA: Diagnosis not present

## 2024-05-28 DIAGNOSIS — L570 Actinic keratosis: Secondary | ICD-10-CM | POA: Diagnosis not present

## 2024-05-28 NOTE — Progress Notes (Signed)
 Follow-Up Visit   Subjective  Debbie Hatfield is a 68 y.o. female who presents for the following: recheck spot on nose.  Patient was seen on 03/30/2024 at which time a spider angioma was noted on the tip of her nose. A photo was taken at that visit. Patient is here today to recheck the area.   The following portions of the chart were reviewed this encounter and updated as appropriate: medications, allergies, medical history  Review of Systems:  No other skin or systemic complaints except as noted in HPI or Assessment and Plan.  Objective  Well appearing patient in no apparent distress; mood and affect are within normal limits.  A focused examination was performed of the following areas: Nose and lips  Relevant exam findings are noted in the Assessment and Plan.  Mid [place] Lip Erythematous thin papules/macules with gritty scale.   Assessment & Plan   Spider angioma tip of nose Exam: red papule(s) Discussed benign nature. Recommend observation. Call for changes.  No changes noted at today's visit. Will continue monitor; photo taken today  ACTINIC KERATOSIS Exam: Erythematous thin papules/macules with gritty scale  Actinic keratoses are precancerous spots that appear secondary to cumulative UV radiation exposure/sun exposure over time. They are chronic with expected duration over 1 year. A portion of actinic keratoses will progress to squamous cell carcinoma of the skin. It is not possible to reliably predict which spots will progress to skin cancer and so treatment is recommended to prevent development of skin cancer.  Recommend daily broad spectrum sunscreen SPF 30+ to sun-exposed areas, reapply every 2 hours as needed.  Recommend staying in the shade or wearing long sleeves, sun glasses (UVA+UVB protection) and wide brim hats (4-inch brim around the entire circumference of the hat). Call for new or changing lesions.  Treatment Plan:  Prior to procedure, discussed  risks of blister formation, small wound, skin dyspigmentation, or rare scar following cryotherapy. Recommend Vaseline ointment to treated areas while healing.  Destruction Procedure Note Destruction method: cryotherapy   Informed consent: discussed and consent obtained   Lesion destroyed using liquid nitrogen: Yes   Outcome: patient tolerated procedure well with no complications   Post-procedure details: wound care instructions given   Locations: mid lower [place] lip # of Lesions Treated: 1   Discussed performing cryotherapy, a biopsy or starting efudex for the lesion. The patient has an upcoming vacation to the beach and a surgery scheduled for the first week of September. Due to her schedule we will perform cryotherapy today. We will recheck the area in 2 months. If the lesion is still present, either a biopsy will be performed or she will start a course of Efudex.  AK (ACTINIC KERATOSIS) Mid Lower [place] Lip  Destruction of lesion - Mid Lower [place] Lip Complexity: simple   Destruction method: cryotherapy   Informed consent: discussed and consent obtained   Timeout:  patient name, date of birth, surgical site, and procedure verified Lesion destroyed using liquid nitrogen: Yes   Region frozen until ice ball extended beyond lesion: Yes   Outcome: patient tolerated procedure well with no complications   Post-procedure details: wound care instructions given    SPIDER ANGIOMA    Return in about 2 months (around 07/28/2024) for AK follow up on the lip.  Debbie Hatfield Rollene Gobble, RN, am acting as scribe for RUFUS CHRISTELLA HOLY, MD .   Documentation: I have reviewed the above documentation for accuracy and completeness, and I agree with the  above.  RUFUS CHRISTELLA HOLY, MD

## 2024-05-28 NOTE — Patient Instructions (Signed)

## 2024-05-29 ENCOUNTER — Telehealth: Payer: Self-pay | Admitting: *Deleted

## 2024-05-29 ENCOUNTER — Encounter: Payer: Self-pay | Admitting: *Deleted

## 2024-06-01 ENCOUNTER — Encounter: Payer: Self-pay | Admitting: *Deleted

## 2024-06-01 ENCOUNTER — Ambulatory Visit
Admission: RE | Admit: 2024-06-01 | Discharge: 2024-06-01 | Disposition: A | Discharge status: Home / Self Care | Source: Ambulatory Visit | Attending: Internal Medicine | Admitting: Internal Medicine

## 2024-06-01 DIAGNOSIS — Z1231 Encounter for screening mammogram for malignant neoplasm of breast: Secondary | ICD-10-CM

## 2024-06-02 ENCOUNTER — Other Ambulatory Visit (HOSPITAL_BASED_OUTPATIENT_CLINIC_OR_DEPARTMENT_OTHER): Payer: Self-pay | Admitting: Family

## 2024-06-02 ENCOUNTER — Encounter: Payer: Self-pay | Admitting: *Deleted

## 2024-06-04 ENCOUNTER — Ambulatory Visit (INDEPENDENT_AMBULATORY_CARE_PROVIDER_SITE_OTHER): Admitting: Licensed Clinical Social Worker

## 2024-06-04 DIAGNOSIS — F332 Major depressive disorder, recurrent severe without psychotic features: Secondary | ICD-10-CM | POA: Diagnosis not present

## 2024-06-04 DIAGNOSIS — F411 Generalized anxiety disorder: Secondary | ICD-10-CM | POA: Diagnosis not present

## 2024-06-04 DIAGNOSIS — F4310 Post-traumatic stress disorder, unspecified: Secondary | ICD-10-CM | POA: Diagnosis not present

## 2024-06-04 NOTE — Progress Notes (Signed)
 THERAPIST PROGRESS NOTE   Session Time: 3:00pm - 4:00pm      Location: Patient: [facility]    Provider: [facility]      Participation Level: Active    Behavioral Response: Alert, casually dressed, euthymic mood/affect      Type of Therapy:  Individual Therapy   Treatment Goals addressed: Depression/anxiety management; Medication compliance; Anger management; Safety planning     Progress Towards Goals: Progressing      Interventions: CBT, anger management    Summary: Debbie Hatfield is a 68 year old widowed Caucasian female that presented today with diagnoses of Major Depressive Disorder, recurrent, severe; Generalized Anxiety Disorder; and PTSD.   Suicidal/Homicidal: None; without intent or plan.     Therapist Response:  Clinician met with Debbie Hatfield today for in person therapy session and assessed for safety, medication compliance, and sobriety.  Debbie Hatfield presented for today's appointment on time and was alert, oriented x5, with no evidence or self-report of active SI/HI or A/V hallucinations.  Debbie Hatfield denied any use of alcohol or illicit substances.  She reported compliance with medication.  Clinician inquired about Debbie Hatfield's current emotional today, as well as any significant changes in thoughts, feelings, or behavior since last check-in.  Debbie Hatfield reported scores of 1/10 for depression, 1/10 for anxiety, and 0/10 for anger/irritability.  Debbie Hatfield denied experiencing any recent panic attacks or outbursts.  Debbie Hatfield reported that a recent struggle was being admitted for inpatient behavioral treatment at [facility] in July after she began experiencing problems controlling her anger.  Debbie Hatfield reported that she had been growing increasingly angry with family and worried she would lash out eventually, so she was admitted for 8 days, and realized that she needed to return to therapy.  She stated "I don't know why, but I felt like I was on edge all the time".  Clinician utilized an Presenter, broadcasting with Debbie Hatfield in session to assist.  This handout explained how anger tends to be an emotion that is easily identifiable due to outward behavior such as frequent emotional outbursts, but can conceal other difficult emotions under the surface if compartmentalization is used.  It was also noted that triggering people, places, and things can influence this emotion, as well as potential outbursts.  A list of common emotions linked to anger was provided, and Debbie Hatfield was tasked with identifying which ones could be having a present influence, in addition to related triggers.  Clinician also encouraged Debbie Hatfield to consider self-care activities which could offer an appropriate stress outlet, and explored the subject with a list of various hobbies that could be helpful to include in routine.  Intervention was effective, as evidenced by Debbie Hatfield's active engagement in discussion on subject, and successful identification of underlying feelings that could have been fueling rising anger.  Debbie Hatfield reported that she has been masking emotions for a long time due to her upbringing with an angry, alcoholic father, and she has been struggling to conceal sadness, pain, frustration, loneliness, stress, and more from her support system.  Debbie Hatfield reported that her inpatient stay made her realize that the way family members like her mother treat her can be triggering, so she plans to set new boundaries, and ensure more time for self-care activities such as reading, taking walks, and doing puzzles.  Clinician will continue to monitor.             Plan: Follow up in 1 week.   Diagnosis: Major Depressive Disorder, recurrent, severe; Generalized Anxiety Disorder; and  PTSD.  Collaboration of Care:   None required at this time.                                                      Patient/Guardian was advised Release of Information must be obtained prior to any record release in order to collaborate their care with an outside provider.  Patient/Guardian was advised if they have not already done so to contact the registration department to sign all necessary forms in order for us  to release information regarding their care.    Consent: Patient/Guardian gives verbal consent for treatment and assignment of benefits for services provided during this visit. Patient/Guardian expressed understanding and agreed to proceed.   [name], LCSW, LCAS 06/04/24

## 2024-06-15 ENCOUNTER — Ambulatory Visit (INDEPENDENT_AMBULATORY_CARE_PROVIDER_SITE_OTHER)

## 2024-06-15 DIAGNOSIS — R55 Syncope and collapse: Secondary | ICD-10-CM

## 2024-06-17 LAB — CUP PACEART REMOTE DEVICE CHECK
Date Time Interrogation Session: 20250830230652
Implantable Pulse Generator Implant Date: 20220728

## 2024-06-19 ENCOUNTER — Ambulatory Visit: Payer: Self-pay | Admitting: Cardiology

## 2024-06-22 ENCOUNTER — Ambulatory Visit (HOSPITAL_COMMUNITY): Admitting: Family

## 2024-06-23 NOTE — Progress Notes (Signed)
 Remote Loop Recorder Transmission

## 2024-06-25 ENCOUNTER — Telehealth (HOSPITAL_COMMUNITY): Payer: Self-pay | Admitting: Licensed Clinical Social Worker

## 2024-06-25 ENCOUNTER — Ambulatory Visit (HOSPITAL_COMMUNITY): Payer: Self-pay | Admitting: Licensed Clinical Social Worker

## 2024-06-25 DIAGNOSIS — Z91199 Patient's noncompliance with other medical treatment and regimen due to unspecified reason: Secondary | ICD-10-CM

## 2024-06-25 NOTE — Telephone Encounter (Signed)
 Debbie Hatfield had an in-person therapy appointment scheduled today for 1pm.  Clinician outreached Debbie Hatfield by phone at 1:09pm when she had not arrived on time.  Debbie Hatfield did not answer this call, so clinician was forced to leave a voicemail reminder that included callback number.  Clinician informed front desk staff of no-show event when Debbie Hatfield did not outreach office or arrive on time by 1:15pm.    [name], LCSW, LCAS 06/25/24

## 2024-06-26 ENCOUNTER — Ambulatory Visit: Payer: Medicare Other

## 2024-06-26 ENCOUNTER — Other Ambulatory Visit: Payer: Self-pay | Admitting: Cardiology

## 2024-07-01 ENCOUNTER — Ambulatory Visit (INDEPENDENT_AMBULATORY_CARE_PROVIDER_SITE_OTHER): Admitting: Licensed Clinical Social Worker

## 2024-07-01 DIAGNOSIS — F4310 Post-traumatic stress disorder, unspecified: Secondary | ICD-10-CM

## 2024-07-01 DIAGNOSIS — F332 Major depressive disorder, recurrent severe without psychotic features: Secondary | ICD-10-CM | POA: Diagnosis not present

## 2024-07-01 DIAGNOSIS — F411 Generalized anxiety disorder: Secondary | ICD-10-CM

## 2024-07-01 NOTE — Progress Notes (Signed)
 Virtual Visit via Video Note   I connected with Debbie Hatfield on 07/01/24 at 2:00pm by video enabled telemedicine application and verified that I am speaking with the correct person using two identifiers.   I discussed the limitations, risks, security and privacy concerns of performing an evaluation and management service by video and the availability of in person appointments. I also discussed with the patient that there may be a patient responsible charge related to this service. The patient expressed understanding and agreed to proceed.   I discussed the assessment and treatment plan with the patient. The patient was provided an opportunity to ask questions and all were answered. The patient agreed with the plan and demonstrated an understanding of the instructions.   The patient was advised to call back or seek an in-person evaluation if the symptoms worsen or if the condition fails to improve as anticipated.   I provided 38 minutes of non-face-to-face time during this encounter.     [name], LCSW, LCAS ______________________________  THERAPIST PROGRESS NOTE   Session Time: 2:00pm - 2:38pm       Location: Patient: Patient home     Provider: Home office      Participation Level: Active    Behavioral Response: Alert, casually dressed, euthymic mood/affect     Type of Therapy:  Individual Therapy   Treatment Goals addressed: Depression/anxiety management; PCP and psychiatry followup; Medication compliance; Anger management; CCTP referral; Safety planning     Progress Towards Goals: Progressing      Interventions: CBT, treatment planning    Summary: Debbie Hatfield is a 68 year old widowed Caucasian female that presented today with diagnoses of Major Depressive Disorder, recurrent, severe; Generalized Anxiety Disorder; and PTSD.   Suicidal/Homicidal: None; without intent or plan.     Therapist Response:  Clinician met with Debbie today for virtual therapy appointment and  assessed for safety, medication compliance, and sobriety.  [name] presented for today's session on time and was alert, oriented x5, with no evidence or self-report of active SI/HI or A/V hallucinations.  Monisha denied any use of alcohol or illicit substances.  She reported compliance with medication.  Clinician inquired about Debbie Hatfield's emotional ratings today, as well as any significant changes in thoughts, feelings, or behavior since previous check-in.  Debbie Hatfield reported scores of 2/10 for depression, 1/10 for anxiety, and 0/10 for anger/irritability.  Debbie Hatfield denied experiencing any recent panic attacks or outbursts.  Debbie Hatfield reported that a recent success has been staying consistent with a self-care routine.  She reported that a struggle was having to travel to [place]  unexpectedly to care for her sick aunt.  Debbie Hatfield reported that following this event, things have been less stressful for her overall.  Clinician recommended revisiting treatment plan today to identify progress toward previous goals and any issues that need to be addressed.  Debbie Hatfield was agreeable to this, so clinician collaborated with her to make updates as follows with her verbal consent: Meet with clinician biweekly for therapy sessions to update on goal progress and address any needs that arise; Follow up with Debbie Kerns, NP x1 every 2 months regarding efficacy of medication and any dose modification necessary; Followup with PCP and other medical professionals at least x1 per month to address progression of physical health conditions; Take medications daily as prescribed to aid in symptom reduction and improvement of daily functioning; Maintain depression severity between 0-2/10 over the next 90 days by attending regular therapy sessions, and engaging in healthy self-care activities 2-3  hours per day to keep mind engaged such as painting and crafting; Maintain average daily anxiety level at 0-1/10 over next 90 days by practicing relaxation techniques  with proven efficacy 2-3x daily, in addition to challenging anxious thoughts that arise to negate negative impact on outlook; Exercise x4 times per week for roughly 1 hour each for benefit of both physical and mental well-being per PCP recommendations; Maintain healthier boundaries with all supports to avoid worsening anxiety and depression, as well as enforce assertive communication skills, with goal of limiting contact with toxic supports until more respectful communication patterns are established; Identify at least 5 substantial triggers which directly influence anger, and 5 anger management techniques which can be utilized to cope with these to avoid outbursts/conflict; Consider referral to CCTP within next 6 months if related trauma related symptoms are shown to significantly impact mood/wellbeing; Voluntarily seek admission to hospital for crisis intervention should SI/HI or A/V H appear and put safety of self or others at risk. Progress is evidenced by Debbie reporting that she has been consistent with PCP/provider appointments, and compliant with prescribed medication.  She reported that she has been trying to take walks everyday for exercise, and maintain healthier boundaries with her support network, which has led to reduction in conflict.  Debbie Hatfield reported that daily depression and anxiety severity have been significantly reduced from 7/10 and 8/10 respectively at start of treatment, and credits this to setting boundaries and ensuring more time for daily self-care.  She reported that she also remains compliant with self-care routine as a healthy outlet for family stress.  Debbie Hatfield reported that she would benefit from being more consistent with individual therapy, but does not feel trauma treatment is warranted at this time.  Debbie Hatfield stated I've gotta stay on track for me. Clinician will continue to monitor.             Plan: Follow up in 2 weeks.   Diagnosis: Major Depressive Disorder, recurrent,  severe; Generalized Anxiety Disorder; and PTSD.  Collaboration of Care:   None required at this time.                                                      Patient/Guardian was advised Release of Information must be obtained prior to any record release in order to collaborate their care with an outside provider. Patient/Guardian was advised if they have not already done so to contact the registration department to sign all necessary forms in order for us  to release information regarding their care.    Consent: Patient/Guardian gives verbal consent for treatment and assignment of benefits for services provided during this visit. Patient/Guardian expressed understanding and agreed to proceed.   [name], LCSW, LCAS 07/01/24

## 2024-07-03 ENCOUNTER — Encounter (HOSPITAL_BASED_OUTPATIENT_CLINIC_OR_DEPARTMENT_OTHER): Payer: Self-pay | Admitting: Cardiovascular Disease

## 2024-07-03 MED ORDER — TELMISARTAN 40 MG PO TABS: 40.0000 mg | ORAL_TABLET | Freq: Every day | ORAL | 3 refills | Status: AC

## 2024-07-09 ENCOUNTER — Ambulatory Visit: Admitting: Dermatology

## 2024-07-13 ENCOUNTER — Ambulatory Visit (INDEPENDENT_AMBULATORY_CARE_PROVIDER_SITE_OTHER): Admitting: Licensed Clinical Social Worker

## 2024-07-13 DIAGNOSIS — F411 Generalized anxiety disorder: Secondary | ICD-10-CM | POA: Diagnosis not present

## 2024-07-13 DIAGNOSIS — F4310 Post-traumatic stress disorder, unspecified: Secondary | ICD-10-CM | POA: Diagnosis not present

## 2024-07-13 DIAGNOSIS — F332 Major depressive disorder, recurrent severe without psychotic features: Secondary | ICD-10-CM | POA: Diagnosis not present

## 2024-07-13 NOTE — Progress Notes (Signed)
 Virtual Visit via Video Note   I connected with Debbie Hatfield on 07/13/24 at 1:00pm by video enabled telemedicine application and verified that I am speaking with the correct person using two identifiers.   I discussed the limitations, risks, security and privacy concerns of performing an evaluation and management service by video and the availability of in person appointments. I also discussed with the patient that there may be a patient responsible charge related to this service. The patient expressed understanding and agreed to proceed.   I discussed the assessment and treatment plan with the patient. The patient was provided an opportunity to ask questions and all were answered. The patient agreed with the plan and demonstrated an understanding of the instructions.   The patient was advised to call back or seek an in-person evaluation if the symptoms worsen or if the condition fails to improve as anticipated.   I provided 48 minutes of non-face-to-face time during this encounter.     [name], LCSW, LCAS ______________________________  THERAPIST PROGRESS NOTE   Session Time: 1:00pm - 1:48pm   Location: Patient: Patient home     Provider: Home office     Participation Level: Active    Behavioral Response: Alert, casually dressed, euthymic mood/affect     Type of Therapy:  Individual Therapy   Treatment Goals addressed: Depression/anxiety management; Medication compliance  Progress Towards Goals: Progressing      Interventions: CBT: mental grounding skills    Summary: Debbie Hatfield is a 68 year old widowed Caucasian female that presented today with diagnoses of Major Depressive Disorder, recurrent, severe; Generalized Anxiety Disorder; and PTSD.   Suicidal/Homicidal: None; without intent or plan.     Therapist Response:  Clinician met with Debbie today for virtual therapy session and assessed for safety, medication compliance, and sobriety.  Debbie Hatfield presented for today's  appointment on time and was alert, oriented x5, with no evidence or self-report of active SI/HI or A/V hallucinations.  Debbie Hatfield denied any use of alcohol or illicit substances.  She reported compliance with medication.  Clinician inquired about Debbie Hatfield's current emotional ratings, as well as any significant changes in thoughts, feelings, or behavior since last check-in.  Debbie Hatfield reported scores of 2/10 for depression, 1/10 for anxiety, and 0/10 for anger/irritability.  Debbie Hatfield denied experiencing any recent panic attacks or outbursts.  Debbie Hatfield reported that a recent success has been staying consistent with a self-care routine.  She reported that a struggle has been dealing with physical pain due to the rainy weather.  She reported that she has tried a number of techniques to distract herself, with mixed success.  She stated I'm trying my best to distract my mind from the pain.  Clinician suggested discussion on grounding techniques today with Debbie Hatfield.  Clinician explained how grounding techniques can be utilized to temporarily distract from distressing thoughts, or feelings, and covered category of mental grounding techniques with her today, including examples for practice such as describing one's environment in detail, playing a categories game, describing an everyday activity in great detail, using one's imagination to visualize a calming image, counting to 10, and more.  Clinician inquired about which one's Debbie Hatfield found appealing, and could see herself adding to developing coping skillset during treatment.  Intervention was effective, as evidenced by Debbie engaging in discussion on the subject and actively trying some of these techniques out in session, noting that they could be helpful if she practices them regularly.  Debbie Hatfield reported that several techniques could be useful, including  describing her environment in detail, playing a categories game involving listing dog breeds, describing an everyday activity like  cooking in great detail, imagining herself relaxing at the beach, reading an interesting story, and trying to find a reason to laugh each day so things don't feel too serious. Debbie Hatfield stated I think grounding could help a lot.  Clinician will continue to monitor.             Plan: Follow up in 2 weeks.   Diagnosis: Major Depressive Disorder, recurrent, severe; Generalized Anxiety Disorder; and PTSD.  Collaboration of Care:   None required at this time.                                                      Patient/Guardian was advised Release of Information must be obtained prior to any record release in order to collaborate their care with an outside provider. Patient/Guardian was advised if they have not already done so to contact the registration department to sign all necessary forms in order for us  to release information regarding their care.    Consent: Patient/Guardian gives verbal consent for treatment and assignment of benefits for services provided during this visit. Patient/Guardian expressed understanding and agreed to proceed.   [name], LCSW, LCAS 07/13/24

## 2024-07-14 NOTE — Progress Notes (Signed)
 Remote Loop Recorder Transmission

## 2024-07-15 NOTE — Progress Notes (Signed)
 Remote Loop Recorder Transmission

## 2024-07-16 ENCOUNTER — Ambulatory Visit

## 2024-07-16 ENCOUNTER — Ambulatory Visit: Payer: Self-pay | Admitting: Cardiology

## 2024-07-16 DIAGNOSIS — R55 Syncope and collapse: Secondary | ICD-10-CM | POA: Diagnosis not present

## 2024-07-16 LAB — CUP PACEART REMOTE DEVICE CHECK
Date Time Interrogation Session: 20251001230746
Implantable Pulse Generator Implant Date: 20220728

## 2024-07-19 ENCOUNTER — Other Ambulatory Visit: Payer: Self-pay | Admitting: Internal Medicine

## 2024-07-19 DIAGNOSIS — A6000 Herpesviral infection of urogenital system, unspecified: Secondary | ICD-10-CM

## 2024-07-20 NOTE — Progress Notes (Signed)
 Remote Loop Recorder Transmission

## 2024-07-28 ENCOUNTER — Encounter: Payer: Self-pay | Admitting: Gastroenterology

## 2024-07-31 ENCOUNTER — Ambulatory Visit: Payer: Medicare Other

## 2024-08-04 ENCOUNTER — Telehealth (HOSPITAL_BASED_OUTPATIENT_CLINIC_OR_DEPARTMENT_OTHER): Admitting: Family

## 2024-08-04 DIAGNOSIS — F332 Major depressive disorder, recurrent severe without psychotic features: Secondary | ICD-10-CM | POA: Diagnosis not present

## 2024-08-04 DIAGNOSIS — F411 Generalized anxiety disorder: Secondary | ICD-10-CM

## 2024-08-04 MED ORDER — DOXEPIN HCL 75 MG PO CAPS: 75.0000 mg | ORAL_CAPSULE | Freq: Every day | ORAL | 0 refills | Status: DC

## 2024-08-04 MED ORDER — DULOXETINE HCL 30 MG PO CPEP: 30.0000 mg | ORAL_CAPSULE | Freq: Every day | ORAL | 0 refills | Status: DC

## 2024-08-04 MED ORDER — DULOXETINE HCL 60 MG PO CPEP: 60.0000 mg | ORAL_CAPSULE | Freq: Every day | ORAL | 0 refills | Status: DC

## 2024-08-04 NOTE — Progress Notes (Signed)
 Virtual Visit via Video Note  I connected with CAPRIA CARTAYA on 08/04/24 at  1:00 PM EDT by a video enabled telemedicine application and verified that I am speaking with the correct person using two identifiers.  Location: Patient: Home Provider: Office   I discussed the limitations of evaluation and management by telemedicine and the availability of in person appointments. The patient expressed understanding and agreed to proceed.   I discussed the assessment and treatment plan with the patient. The patient was provided an opportunity to ask questions and all were answered. The patient agreed with the plan and demonstrated an understanding of the instructions.   The patient was advised to call back or seek an in-person evaluation if the symptoms worsen or if the condition fails to improve as anticipated.  I provided 20 minutes of non-face-to-face time during this encounter.   Staci LOISE Kerns, NP   [facility] MD/PA/NP OP Progress Note  08/04/2024 1:07 PM SANDARA TYREE  MRN:  [id number]  Chief Complaint: Medication management   HPI:   Lynsey Ange 68 year old female presents for medication management follow-up appointment.  She was seen and evaluated via virtual platform through care agility.  Carries a diagnosis related to major depressive disorder and generalized anxiety disorder.  Currently prescribed Cymbalta  90 mg daily and doxepin  (sinequan ) 75 mg nightly which she reports she has been taking and tolerating well.  She reports she has a follow-up appointment for MRI due to back pain.   Audelia states she is having a difficult time with ambulation so she has been taking it easy.  Overall she reports her mood is stabilized that she continues to see Joane Ricker for therapy services.  States she is working on Teacher, adult education.  No concerns related to suicidal or homicidal ideations.  Denies auditory visual hallucinations.  Reports a good appetite.  States she is rested well throughout  the night.  Will continue to monitor symptoms.  Patient to follow-up 3 months for medication management.  Support encouragement reassurance was provided.  Zebedee presents with a bright and pleasant affect.  She is alert and oriented x 4.  She reports she currently resides with her mom.  States her mother has been helping her with her ADLs but overall, she reported that her mood has improved.   I feel a little guilty that my mom is doing everything around the house.   Visit Diagnosis:    ICD-10-CM   1. Severe episode of recurrent major depressive disorder, without psychotic features (HCC)  F33.2     2. GAD (generalized anxiety disorder)  F41.1       Past Psychiatric History:   Past Medical History:  Past Medical History:  Diagnosis Date   AKI (acute kidney injury) 10/03/2023   Cancer (HCC) 03/30/2024   sqamous cell ca lip and back   Closed fracture of right distal radius    Degeneration of lumbar intervertebral disc 01/26/2020   Depression with anxiety 10/03/2023   GAD (generalized anxiety disorder) 11/15/2021   Gastroparesis    GERD (gastroesophageal reflux disease)    History of CVA (cerebrovascular accident) 10/03/2023   Hyperlipidemia LDL goal <100 03/26/2022   The ASCVD Risk score (Arnett DK, et al., 2019) failed to calculate for the following reasons:    The valid HDL cholesterol range is 20 to 100 mg/dL     Hypertension    Hypotension 10/03/2023   Irritable bowel disease    Lupus    Major depression  Morton's neuroma of left foot 08/30/2023   Near syncope 10/03/2023   Need for shingles vaccine 11/15/2021   Need for vaccination 11/15/2021   Nonrheumatic mitral valve regurgitation 05/10/2022   Osteopenia 12/14/2015   Peptic ulcer disease 12/14/2015   Recurrent major depressive disorder, in partial remission 11/15/2021   Sinus bradycardia 10/03/2023   Stage 3a chronic kidney disease (HCC) 11/15/2021   Estimated Creatinine Clearance: 49.7 mL/min (by C-G formula  based on SCr of 1.01 mg/dL).      Estimated Creatinine Clearance: 45.9 mL/min (A) (by C-G formula based on SCr of 1.07 mg/dL (H)).      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   Stroke [facility])    Trigeminal neuralgia    Visit for screening mammogram 11/15/2021    Past Surgical History:  Procedure Laterality Date   BUBBLE STUDY  07/05/2022   Procedure: BUBBLE STUDY;  Surgeon: Loni Soyla LABOR, MD;  Location: [place] [facility];  Service: Cardiology;;   CARPAL TUNNEL RELEASE Left 05/15/2023   CHOLECYSTECTOMY     COLONOSCOPY     LOOP RECORDER IMPLANT  2021   ORIF WRIST FRACTURE Right 09/22/2020   Procedure: OPEN REDUCTION INTERNAL FIXATION (ORIF) WRIST FRACTURE;  Surgeon: Carolee Lynwood JINNY DOUGLAS, MD;  Location: [facility];  Service: Orthopedics;  Laterality: Right;    TEE WITHOUT CARDIOVERSION N/A 07/05/2022   Procedure: TRANSESOPHAGEAL ECHOCARDIOGRAM (TEE);  Surgeon: Loni Soyla LABOR, MD;  Location: [facility];  Service: Cardiology;  Laterality: N/A;   UPPER GASTROINTESTINAL ENDOSCOPY      Family Psychiatric History:   Family History:  Family History  Problem Relation Age of Onset   Heart block Mother        Pacemaker   Hypertension Mother    Depression Mother    Gout Mother    Macular degeneration Mother    Aneurysm Mother    Alcoholism Father    Heart failure Maternal Grandmother    Hypertension Maternal Grandmother    Congestive Heart Failure Maternal Grandmother    Diabetes Maternal Grandmother    Congestive Heart Failure Maternal Grandfather    Colon cancer Maternal Grandfather    Stroke Paternal Grandfather    Hypertension Paternal Grandfather    Esophageal cancer Neg Hx    Stomach cancer Neg Hx    Rectal cancer Neg Hx     Social History:  Social History   Socioeconomic History   Marital status: Widowed    Spouse name: Not on file   Number of children: 0   Years of education: Not on file   Highest education level: Bachelor's degree (e.g., BA,  AB, BS)  Occupational History    Comment: RN ortho/neuro at [employer]  Tobacco Use   Smoking status: Never    Passive exposure: Past   Smokeless tobacco: Never  Vaping Use   Vaping status: Never Used  Substance and Sexual Activity   Alcohol use: Yes    Comment: social   Drug use: Never   Sexual activity: Not Currently    Birth control/protection: Post-menopausal  Other Topics Concern   Not on file  Social History Narrative   06/13/22 living with her mother   2 [place] Dew per Week   Social Drivers of Health   Financial Resource Strain: Low Risk  (05/05/2024)   Overall Financial Resource Strain (CARDIA)    Difficulty of Paying Living Expenses: Not very hard  Food Insecurity: No Food Insecurity (05/05/2024)   Hunger Vital Sign    Worried About Running Out  of Food in the Last Year: Never true    Ran Out of Food in the Last Year: Never true  Transportation Needs: No Transportation Needs (05/05/2024)   PRAPARE - Administrator, Civil Service (Medical): No    Lack of Transportation (Non-Medical): No  Physical Activity: Sufficiently Active (05/05/2024)   Exercise Vital Sign    Days of Exercise per Week: 7 days    Minutes of Exercise per Session: 60 min  Stress: Stress Concern Present (05/05/2024)   [employer] of Occupational Health - Occupational Stress Questionnaire    Feeling of Stress: To some extent  Social Connections: Socially Isolated (05/05/2024)   Social Connection and Isolation Panel    Frequency of Communication with Friends and Family: More than three times a week    Frequency of Social Gatherings with Friends and Family: Twice a week    Attends Religious Services: Never    Database administrator or Organizations: No    Attends Banker Meetings: Never    Marital Status: Widowed    Allergies:  Allergies  Allergen Reactions   Plaquenil  [Hydroxychloroquine ] Other (See Comments)    Retinal damage   Penicillins Rash   Sulfa Antibiotics  Nausea And Vomiting   Sulfamethoxazole-Trimethoprim Nausea And Vomiting    Metabolic Disorder Labs: Lab Results  Component Value Date   HGBA1C 5.7 (H) 05/08/2022   MPG 116.89 05/08/2022   No results found for: PROLACTIN Lab Results  Component Value Date   CHOL 172 03/17/2024   TRIG 59 03/17/2024   HDL 96 03/17/2024   CHOLHDL 1.8 03/17/2024   VLDL 24 05/08/2022   LDLCALC 64 03/17/2024   LDLCALC 97 02/18/2024   Lab Results  Component Value Date   TSH 1.886 10/02/2023   TSH 2.11 01/21/2023    Therapeutic Level Labs: No results found for: LITHIUM No results found for: VALPROATE Lab Results  Component Value Date   CBMZ <2.0 (L) 07/29/2023    Current Medications: Current Outpatient Medications  Medication Sig Dispense Refill   acyclovir  (ZOVIRAX ) 400 MG tablet TAKE 1 TABLET(400 MG) BY MOUTH DAILY 90 tablet 1   amLODipine  (NORVASC ) 2.5 MG tablet Take 1 tablet (2.5 mg total) by mouth at bedtime. CONTINUE 5 MG TABLETS IN MORNING 90 tablet 3   Cholecalciferol 125 MCG (5000 UT) capsule Take 5,000 Units by mouth daily.     clopidogrel  (PLAVIX ) 75 MG tablet Take 1 tablet (75 mg total) by mouth daily. 30 tablet 0   doxepin  (SINEQUAN ) 75 MG capsule Take 1 capsule (75 mg total) by mouth at bedtime. 90 capsule 0   DULoxetine  (CYMBALTA ) 30 MG capsule Take 1 capsule (30 mg total) by mouth daily. 90 capsule 0   DULoxetine  (CYMBALTA ) 60 MG capsule Take 1 capsule (60 mg total) by mouth at bedtime. 90 capsule 0   gabapentin  (NEURONTIN ) 300 MG capsule Take 600 mg by mouth 2 (two) times daily.     hydrOXYzine  (VISTARIL ) 25 MG capsule Take 1 capsule (25 mg total) by mouth 3 (three) times daily as needed for anxiety. 30 capsule 0   pantoprazole  (PROTONIX ) 40 MG tablet Take 1 tablet (40 mg total) by mouth 2 (two) times daily before a meal. 180 tablet 3   pravastatin  (PRAVACHOL ) 20 MG tablet TAKE 1 TABLET(20 MG) BY MOUTH 3 TIMES A WEEK 36 tablet 3   spironolactone  (ALDACTONE ) 25 MG tablet  Take 0.5 tablets (12.5 mg total) by mouth daily. 45 tablet 3   telmisartan  (MICARDIS )  40 MG tablet Take 1 tablet (40 mg total) by mouth daily. 30 tablet 3   telmisartan  (MICARDIS ) 40 MG tablet Take 1 tablet (40 mg total) by mouth daily. 90 tablet 3   tizanidine  (ZANAFLEX ) 2 MG capsule Take 4 mg by mouth at bedtime.     Current Facility-Administered Medications  Medication Dose Route Frequency Provider Last Rate Last Admin   [START ON 10/02/2024] denosumab  (PROLIA ) injection 60 mg  60 mg Subcutaneous Once Joshua Debby CROME, MD         Musculoskeletal: Virtual assessment  Psychiatric Specialty Exam: Review of Systems  Psychiatric/Behavioral:  Negative for decreased concentration and sleep disturbance. The patient is not nervous/anxious.     There were no vitals taken for this visit.There is no height or weight on file to calculate BMI.  General Appearance: Casual  Eye Contact:  Good  Speech:  Clear and Coherent  Volume:  Normal  Mood:  Euthymic  Affect:  Congruent  Thought Process:  Coherent  Orientation:  Full (Time, Place, and Person)  Thought Content: Logical   Suicidal Thoughts:  No  Homicidal Thoughts:  No  Memory:  Immediate;   Good Recent;   Good  Judgement:  Good  Insight:  Good  Psychomotor Activity:  Normal  Concentration:  Concentration: Good  Recall:  Good  Fund of Knowledge: Good  Language: Good  Akathisia:  No  Handed:  Right  AIMS (if indicated): not done  Assets:  Communication Skills Desire for Improvement  ADL's:  Intact  Cognition: WNL  Sleep:  Good   Screenings: AUDIT    Flowsheet Row Clinical Support from 05/05/2024 in [facility] [place] Admission (Discharged) from 04/21/2024 in [facility] BEHAVIORAL MEDICINE Office Visit from 04/02/2024 in [facility] at [facility]  Alcohol Use Disorder Identification Test Final Score (AUDIT) 1 3 3     GAD-7    Flowsheet Row Counselor from 11/26/2023 in [place]  [facility] at [facility] Visit from 11/15/2021 in [facility] at [facility]  Total GAD-7 Score 17 2   PHQ2-9    Flowsheet Row Patient Outreach Telephone from 05/19/2024 in [place] POPULATION HEALTH DEPARTMENT Clinical Support from 05/05/2024 in [facility] [place] Telephone from 04/29/2024 in [place] POPULATION HEALTH DEPARTMENT Counselor from 11/26/2023 in [facility] at [facility] Visit from 10/29/2023 in [facility] [place]  PHQ-2 Total Score 0 4 5 5  0  PHQ-9 Total Score -- 16 17 19  --   Flowsheet Row Clinical Support from 05/05/2024 in [facility] [place] Most recent reading at 05/05/2024 11:02 AM Admission (Discharged) from 04/21/2024 in [facility] BEHAVIORAL MEDICINE Most recent reading at 04/23/2024  9:29 AM ED from 04/21/2024 in [facility] Most recent reading at 04/21/2024  4:47 AM  C-SSRS RISK CATEGORY High Risk No Risk No Risk     Assessment and Plan:  Milayna Rotenberg is a 68 year old female presents for medication management follow-up appointment.  Carries a diagnosis related to major depressive disorder generalized anxiety disorder.  Recently hospitalized earlier part of this year states mood is stabilized since inpatient admission.  Currently taking Cymbalta  90 mg daily and doxepin  75 mg nightly she states taking and tolerating medications well.  No concerns for restlessness or akathisia.  Reports overall her mood is stabilized.  Reports plans to follow-up with the MRI due  to chronic back pain.  States she is usually willing roller walker for ambulation assistance due to chronic pain states she has been working on ADLs and grounding techniques with her therapist.  No other notable concerns at this visit.  Patient to follow-up 3 months for medication management follow-up  appointment.   Collaboration of Care: Collaboration of Care: Medication Management AEB continue Cymbalta  and doxepin  as directed  Patient/Guardian was advised Release of Information must be obtained prior to any record release in order to collaborate their care with an outside provider. Patient/Guardian was advised if they have not already done so to contact the registration department to sign all necessary forms in order for us  to release information regarding their care.   Consent: Patient/Guardian gives verbal consent for treatment and assignment of benefits for services provided during this visit. Patient/Guardian expressed understanding and agreed to proceed.    Staci LOISE Kerns, NP 08/04/2024, 1:07 PM

## 2024-08-07 ENCOUNTER — Telehealth (HOSPITAL_BASED_OUTPATIENT_CLINIC_OR_DEPARTMENT_OTHER): Payer: Self-pay

## 2024-08-07 ENCOUNTER — Telehealth: Payer: Self-pay

## 2024-08-07 NOTE — Telephone Encounter (Signed)
 Patient only has a Horticulturist, commercial.  No clearance should be needed on this for device clinic.   Let me know if any questions.  Thanks.

## 2024-08-07 NOTE — Telephone Encounter (Signed)
 Called patient back to to schedule a televisit for pre-op on 08/14/24 @ 10:40, Meds, Rec, and consent done.

## 2024-08-07 NOTE — Telephone Encounter (Signed)
   Pre-operative Risk Assessment    Patient Name: Debbie Hatfield  DOB: 1955/11/22 MRN: [id number]   Date of last office visit: 04/01/2024 with Annabella Scarce, MD Date of next office visit: N/A  Request for Surgical Clearance    Procedure:  Lumbar decompression   Date of Surgery:  Clearance TBD                                 Surgeon:  Dr. Reyes Billing Surgeon's Group or Practice Name:  [facility] Phone number:  [phone] Fax number:  [phone] or [phone] - Attention: Katheryn Dustman   Type of Clearance Requested:   - Medical  - Pharmacy:  Hold Clopidogrel  (Plavix ) - Not indicated   Type of Anesthesia:  General    Additional requests/questions:  Patient has a Medtronic ILR  Signed, Patrcia Iverson CROME   08/07/2024, 9:41 AM

## 2024-08-07 NOTE — Telephone Encounter (Signed)
 Called patient back to to schedule a televisit for pre-op on 08/14/24 @ 10:40, Meds, Rec, and consent done.       Patient Consent for Virtual Visit        Debbie Hatfield has provided verbal consent on 08/07/2024 for a virtual visit (video or telephone).   CONSENT FOR VIRTUAL VISIT FOR:  Debbie Hatfield  By participating in this virtual visit I agree to the following:  I hereby voluntarily request, consent and authorize [facility] and its employed or contracted physicians, physician assistants, nurse practitioners or other licensed health care professionals (the Practitioner), to provide me with telemedicine health care services (the "Services) as deemed necessary by the treating Practitioner. I acknowledge and consent to receive the Services by the Practitioner via telemedicine. I understand that the telemedicine visit will involve communicating with the Practitioner through live audiovisual communication technology and the disclosure of certain medical information by electronic transmission. I acknowledge that I have been given the opportunity to request an in-person assessment or other available alternative prior to the telemedicine visit and am voluntarily participating in the telemedicine visit.  I understand that I have the right to withhold or withdraw my consent to the use of telemedicine in the course of my care at any time, without affecting my right to future care or treatment, and that the Practitioner or I may terminate the telemedicine visit at any time. I understand that I have the right to inspect all information obtained and/or recorded in the course of the telemedicine visit and may receive copies of available information for a reasonable fee.  I understand that some of the potential risks of receiving the Services via telemedicine include:  Delay or interruption in medical evaluation due to technological equipment failure or disruption; Information transmitted may not  be sufficient (e.g. poor resolution of images) to allow for appropriate medical decision making by the Practitioner; and/or  In rare instances, security protocols could fail, causing a breach of personal health information.  Furthermore, I acknowledge that it is my responsibility to provide information about my medical history, conditions and care that is complete and accurate to the best of my ability. I acknowledge that Practitioner's advice, recommendations, and/or decision may be based on factors not within their control, such as incomplete or inaccurate data provided by me or distortions of diagnostic images or specimens that may result from electronic transmissions. I understand that the practice of medicine is not an exact science and that Practitioner makes no warranties or guarantees regarding treatment outcomes. I acknowledge that a copy of this consent can be made available to me via my patient portal [facility] MyChart), or I can request a printed copy by calling the office of  [facility].    I understand that my insurance will be billed for this visit.   I have read or had this consent read to me. I understand the contents of this consent, which adequately explains the benefits and risks of the Services being provided via telemedicine.  I have been provided ample opportunity to ask questions regarding this consent and the Services and have had my questions answered to my satisfaction. I give my informed consent for the services to be provided through the use of telemedicine in my medical care

## 2024-08-07 NOTE — Telephone Encounter (Signed)
   Name: Debbie Hatfield  DOB: 1956-08-26  MRN: [id number]  Primary Cardiologist: Annabella Scarce, MD   Preoperative team, please contact this patient and set up a phone call appointment for further preoperative risk assessment. Please obtain consent and complete medication review. Thank you for your help.  I confirm that guidance regarding antiplatelet and oral anticoagulation therapy has been completed and, if necessary, noted below.  Plavix  is not prescribed by Cardiology. Prescribed by Dr.Jadapalle. Surgeon will need to reach out to that provider for recommendations.  I also confirmed the patient resides in the state of [place] . As per [facility] Medical Board telemedicine laws, the patient must reside in the state in which the provider is licensed.   Lamarr Satterfield, NP 08/07/2024, 9:48 AM [facility]

## 2024-08-07 NOTE — Telephone Encounter (Signed)
 Left message to call back to schedule tele pre op appt.

## 2024-08-07 NOTE — Telephone Encounter (Signed)
 Patient called back and was informed, she did not need a clearance due that she only has a loop recorder. Patient understood.

## 2024-08-09 ENCOUNTER — Encounter: Payer: Self-pay | Admitting: Rheumatology

## 2024-08-09 DIAGNOSIS — M3219 Other organ or system involvement in systemic lupus erythematosus: Secondary | ICD-10-CM

## 2024-08-09 DIAGNOSIS — Z79899 Other long term (current) drug therapy: Secondary | ICD-10-CM

## 2024-08-10 ENCOUNTER — Other Ambulatory Visit: Payer: Self-pay | Admitting: Internal Medicine

## 2024-08-10 NOTE — Telephone Encounter (Signed)
 I am uncertain about the etiology of the sores in her mouth.  We can send a prescription for Magic mouthwash with hydrocortisone and lidocaine  combination.  She should also see her PCP to evaluate for the cause of oral ulcers.  We have not checked autoimmune labs in a while.  I would recommend for her to come in to get labs.  The labs should include CBC, CMP, urine protein creatinine ratio, ANA, double-stranded ENA, complements, sed rate.

## 2024-08-11 ENCOUNTER — Ambulatory Visit (INDEPENDENT_AMBULATORY_CARE_PROVIDER_SITE_OTHER): Admitting: Dermatology

## 2024-08-11 ENCOUNTER — Encounter: Payer: Self-pay | Admitting: Internal Medicine

## 2024-08-11 ENCOUNTER — Encounter: Payer: Self-pay | Admitting: Dermatology

## 2024-08-11 VITALS — BP 136/78

## 2024-08-11 DIAGNOSIS — L570 Actinic keratosis: Secondary | ICD-10-CM

## 2024-08-11 DIAGNOSIS — Z09 Encounter for follow-up examination after completed treatment for conditions other than malignant neoplasm: Secondary | ICD-10-CM | POA: Diagnosis not present

## 2024-08-11 DIAGNOSIS — L820 Inflamed seborrheic keratosis: Secondary | ICD-10-CM | POA: Diagnosis not present

## 2024-08-11 DIAGNOSIS — Z872 Personal history of diseases of the skin and subcutaneous tissue: Secondary | ICD-10-CM

## 2024-08-11 MED ORDER — NYSTATIN 100000 UNIT/ML MT SUSP: 5.0000 mL | Freq: Three times a day (TID) | OROMUCOSAL | 0 refills | Status: DC

## 2024-08-11 NOTE — Patient Instructions (Signed)

## 2024-08-11 NOTE — Progress Notes (Signed)
   Follow-Up Visit   Subjective  Debbie Hatfield is a 68 y.o. female who presents for the following: AK follow up.  AK on the lip has been frozen 2 times. Patient reports that it is smooth now.   Patient states that she has noticed a plaque on her left shoulder that is concerns her. Itchy and bleeding, not previously treated.  The following portions of the chart were reviewed this encounter and updated as appropriate: medications, allergies, medical history  Review of Systems:  No other skin or systemic complaints except as noted in HPI or Assessment and Plan.  Objective  Well appearing patient in no apparent distress; mood and affect are within normal limits.   A focused examination was performed of the following areas: Lip and left posterior shoulder.  Relevant exam findings are noted in the Assessment and Plan.  Left Upper Back Hemorrhaging crusted papule    Assessment & Plan   HISTORY OF PRECANCEROUS ACTINIC KERATOSIS- lip - site(s) of PreCancerous Actinic Keratosis clear today. - these may recur and new lesions may form requiring treatment to prevent transformation into skin cancer - observe for new or changing spots and contact Debbie Hatfield for appointment if occur - photoprotection with sun protective clothing; sunglasses and broad spectrum sunscreen with SPF of at least 30 + and frequent self skin exams recommended - yearly exams by a dermatologist recommended for persons with history of PreCancerous Actinic Keratoses   INFLAMED SEBORRHEIC KERATOSIS Left Upper Back Destruction of lesion - Left Upper Back Complexity: simple   Destruction method: cryotherapy   Informed consent: discussed and consent obtained   Timeout:  patient name, date of birth, surgical site, and procedure verified Lesion destroyed using liquid nitrogen: Yes   Region frozen until ice ball extended beyond lesion: Yes   Outcome: patient tolerated procedure well with no complications    Post-procedure details: wound care instructions given    ACTINIC KERATOSES    Return in 6 months (on 02/09/2025) for AK recheck Lip and L posterior shoulder.  Debbie Rollene Gobble, RN, am acting as scribe for Debbie CHRISTELLA HOLY, MD .   Documentation: I have reviewed the above documentation for accuracy and completeness, and I agree with the above.  Debbie CHRISTELLA HOLY, MD

## 2024-08-11 NOTE — Patient Instructions (Incomplete)

## 2024-08-11 NOTE — Progress Notes (Unsigned)
 No chief complaint on file.   HISTORY OF PRESENT ILLNESS:  08/11/24 ALL:  Debbie Hatfield is a 68 y.o. 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    She continues Plavix  and pravastatin  daily. BP is usually    Gabapentin ? Bacloden? Tizanidine ?  HISTORY (copied from Dr Duncan previous note)  She is reporting more pain in the right buttock.   She has been told by Dr. Bonner that she has piriformis syndrome.   He is going to do an injection soon.  She was also prescribed percocet but rarely takes   She is also reporting more difficulty with short term memory.  She still has impairments including word finding difficult, focus/processing (auditory > visual).   She scored 24/30 (borderline mild cognitive impairment) but had 5/5 recall at 20 minutes so likely more in low normal range.   She does take gabapentin , tizanidine  and baclofen  and meclizine   She has SLE/RA and sees Dr. Dolphus.   She has antiphospholipid antibodies and SLE (dx 30 years ago).  y   She was placed on Imuran  and had difficulty tolerating it.   She also had difficulty tolerating methotrexate.    Plaquenil  caused eye issues.   Arava was discussed at yesterday's visit with rheum  She is on aspirin  and plavix  DAPT for prophylaxis.   She also has a PFO and saw Dr. Wonda.  Closure has not been recommended.     She has phasic spasms of right leg/hip that started after the stroke.  These occur 3-4 times a day, worse at night..  Tizanifine had not helped and caused s.e. so she takes just at night.     She can only partially tolerated baclofen  10 mg bid but it does help the most   It makes her dizzy.   SABRASpasms can be painful.       She stopped tramadol .  She is also on gabapentin  600 tid and 4 mg tizanidine  .       She snores and has excessive daytime sleepiness.  She often dozes off watching TV and falls asleep easily during the day.  A home sleep study 07/17/2022 did not show any  significant OSA (AHI 4.6).         08/07/2023   11:17 AM  Montreal Cognitive Assessment   Visuospatial/ Executive (0/5) 4  Naming (0/3) 3  Attention: Read list of digits (0/2) 2  Attention: Read list of letters (0/1) 1  Attention: Serial 7 subtraction starting at 100 (0/3) 1  Language: Repeat phrase (0/2) 1  Language : Fluency (0/1) 1  Abstraction (0/2) 2  Delayed Recall (0/5) 3  Orientation (0/6) 6  Total 24  Adjusted Score (based on education) 24    At 20 minutes, she was able to recall all 5 of the words     History of stroke: She was working as an [employer].  She was driving to work 2/75/7979 3 AM when she noted her left face was drooping and she felt speech unable to speak. At first she had difficulty with word finding and slurring.  She stuttered a little.     She went to [employer] where she worked and another engineer, civil (consulting) also noted the facial droop and speech issues.  Her tongue felt thick and she felt numb on the left side.SABRA  She went to the ED at [facility].  She had a CT, MRi and tenecteplase  (TNK)  thrombolytic therapy quickly.   She saw Dr. Jerri.   After TNK therapy, facial weakness and speech started to improve 8 hours later.   She is seeing speech therapy and is near baseline now..     She noted some patchy visual changes around the time of the stroke x 2 week.       She notes mild cognitive issues since the stroke but not before.     Other neurologic events:  She had bells palsy many years ago and has left Trigeminal neuralgia x 30 years.  She has noted left leg weakness x 10 years that is progressing.  She can walk one hour but is slower than others.     The onset of the leg weakness was fairly sudden around 10 years ago.   Gait has slowly worsened.  She has 3 falls this year.  She has trouble getting up from the floor.     She has left anterolateral thigh numbness.   She has visual changes due to Plaquenil  toxicity (for SLE vs undifferentiated CTD).   Imaging: MRI of the brain  05/07/2022 showed an acute stroke in the right frontoparietal region.  She has multiple T2/FLAIR hypertense foci.  Many are nonspecific but some are periventricular and radially oriented to the ventricles.  CT angiogram showed no large vessel occlusion or significant stenosis.   CT angiogram of the head and neck 05/07/2022 showed no emergent large vessel occlusion.  A thyroid  nodule was noted.   MRI of the cervical and thoracic spine 06/24/2022 shows a normal spinal cord.  In the cervical spine there are mild degenerative changes at C5-C6 and C6-C7.  No spinal stenosis or nerve root compression.   MRI of the lumbar spine from 06/04/2022:   At L2-L3, there is minimal retrolisthesis and disc bulging.  There is moderate right facet hypertrophy.  No spinal stenosis.  Mild left foraminal narrowing but no nerve root compression.    At L3-L4, there is minimal anterolisthesis associated with severe facet hypertrophy and other degenerative change.  There is moderately severe left and mild right foraminal narrowing with potential for left L3 nerve root compression.    L4-L5: There is minimal anterolisthesis and right greater than left facet hypertrophy and other degenerative change causing moderately severe right foraminal narrowing with potential for right L4 nerve root compression.  No spinal stenosis.   REVIEW OF SYSTEMS: Out of a complete 14 system review of symptoms, the patient complains only of the following symptoms, and all other reviewed systems are negative.   ALLERGIES: Allergies  Allergen Reactions   Plaquenil  [Hydroxychloroquine ] Other (See Comments)    Retinal damage   Penicillins Rash   Sulfa Antibiotics Nausea And Vomiting   Sulfamethoxazole-Trimethoprim Nausea And Vomiting     HOME MEDICATIONS: Outpatient Medications Prior to Visit  Medication Sig Dispense Refill   acyclovir  (ZOVIRAX ) 400 MG tablet TAKE 1 TABLET(400 MG) BY MOUTH DAILY 90 tablet 1   amLODipine  (NORVASC ) 2.5 MG tablet  Take 1 tablet (2.5 mg total) by mouth at bedtime. CONTINUE 5 MG TABLETS IN MORNING 90 tablet 3   Cholecalciferol 125 MCG (5000 UT) capsule Take 5,000 Units by mouth daily.     clopidogrel  (PLAVIX ) 75 MG tablet Take 1 tablet (75 mg total) by mouth daily. 30 tablet 0   doxepin  (SINEQUAN ) 75 MG capsule Take 1 capsule (75 mg total) by mouth at bedtime. 90 capsule 0   DULoxetine  (CYMBALTA ) 30 MG capsule Take 1 capsule (30 mg total)  by mouth daily. 90 capsule 0   DULoxetine  (CYMBALTA ) 60 MG capsule Take 1 capsule (60 mg total) by mouth at bedtime. 90 capsule 0   gabapentin  (NEURONTIN ) 300 MG capsule Take 600 mg by mouth 2 (two) times daily.     hydrOXYzine  (VISTARIL ) 25 MG capsule Take 1 capsule (25 mg total) by mouth 3 (three) times daily as needed for anxiety. 30 capsule 0   magic mouthwash (nystatin, hydrocortisone, diphenhydrAMINE) suspension Swish and spit 5 mLs 3 (three) times daily. 240 mL 0   pantoprazole  (PROTONIX ) 40 MG tablet Take 1 tablet (40 mg total) by mouth 2 (two) times daily before a meal. 180 tablet 3   pravastatin  (PRAVACHOL ) 20 MG tablet TAKE 1 TABLET(20 MG) BY MOUTH 3 TIMES A WEEK 36 tablet 3   spironolactone  (ALDACTONE ) 25 MG tablet Take 0.5 tablets (12.5 mg total) by mouth daily. 45 tablet 3   telmisartan  (MICARDIS ) 40 MG tablet Take 1 tablet (40 mg total) by mouth daily. 30 tablet 3   telmisartan  (MICARDIS ) 40 MG tablet Take 1 tablet (40 mg total) by mouth daily. 90 tablet 3   tizanidine  (ZANAFLEX ) 2 MG capsule Take 4 mg by mouth at bedtime.     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   Closed fracture of right distal radius     Degeneration of lumbar intervertebral disc 01/26/2020   Depression with anxiety 10/03/2023   GAD (generalized anxiety disorder) 11/15/2021   Gastroparesis    GERD (gastroesophageal reflux disease)    History of CVA (cerebrovascular accident) 10/03/2023   Hyperlipidemia LDL goal <100 03/26/2022   The ASCVD Risk score (Arnett DK, et al., 2019) failed to calculate for the following reasons:    The valid HDL cholesterol range is 20 to 100 mg/dL     Hypertension    Hypotension 10/03/2023   Irritable bowel disease    Lupus    Major depression    Morton's neuroma of left foot 08/30/2023   Near syncope 10/03/2023   Need for shingles vaccine 11/15/2021   Need for vaccination 11/15/2021   Nonrheumatic mitral valve regurgitation 05/10/2022   Osteopenia 12/14/2015   Peptic ulcer disease 12/14/2015   Recurrent major depressive disorder, in partial remission 11/15/2021   Sinus bradycardia 10/03/2023   Stage 3a chronic kidney disease (HCC) 11/15/2021   Estimated Creatinine Clearance: 49.7 mL/min (by C-G formula based on SCr of 1.01 mg/dL).      Estimated Creatinine Clearance: 45.9 mL/min (A) (by C-G formula based on SCr of 1.07 mg/dL (H)).      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   Stroke [facility])    Trigeminal neuralgia    Visit for screening mammogram 11/15/2021     PAST SURGICAL HISTORY: Past Surgical History:  Procedure Laterality Date   BUBBLE STUDY  07/05/2022   Procedure: BUBBLE STUDY;  Surgeon: Loni Soyla LABOR, MD;  Location[facility] ENDOSCOPY;  Service: Cardiology;;   CARPAL TUNNEL RELEASE Left 05/15/2023   CHOLECYSTECTOMY     COLONOSCOPY     LOOP RECORDER IMPLANT  2021   ORIF WRIST FRACTURE Right 09/22/2020   Procedure: OPEN REDUCTION INTERNAL FIXATION (  ORIF) WRIST FRACTURE;  Surgeon: Carolee Lynwood JINNY DOUGLAS, MD;  Location: [facility];  Service: Orthopedics;  Laterality: Right;    TEE WITHOUT CARDIOVERSION N/A 07/05/2022   Procedure: TRANSESOPHAGEAL ECHOCARDIOGRAM  (TEE);  Surgeon: Loni Soyla LABOR, MD;  Location[facility] ENDOSCOPY;  Service: Cardiology;  Laterality: N/A;   UPPER GASTROINTESTINAL ENDOSCOPY       FAMILY HISTORY: Family History  Problem Relation Age of Onset   Heart block Mother        Pacemaker   Hypertension Mother    Depression Mother    Gout Mother    Macular degeneration Mother    Aneurysm Mother    Alcoholism Father    Heart failure Maternal Grandmother    Hypertension Maternal Grandmother    Congestive Heart Failure Maternal Grandmother    Diabetes Maternal Grandmother    Congestive Heart Failure Maternal Grandfather    Colon cancer Maternal Grandfather    Stroke Paternal Grandfather    Hypertension Paternal Grandfather    Esophageal cancer Neg Hx    Stomach cancer Neg Hx    Rectal cancer Neg Hx      SOCIAL HISTORY: Social History   Socioeconomic History   Marital status: Widowed    Spouse name: Not on file   Number of children: 0   Years of education: Not on file   Highest education level: Bachelor's degree (e.g., BA, AB, BS)  Occupational History    Comment: RN ortho/neuro at [employer]  Tobacco Use   Smoking status: Never    Passive exposure: Past   Smokeless tobacco: Never  Vaping Use   Vaping status: Never Used  Substance and Sexual Activity   Alcohol use: Yes    Comment: social   Drug use: Never   Sexual activity: Not Currently    Birth control/protection: Post-menopausal  Other Topics Concern   Not on file  Social History Narrative   06/13/22 living with her mother   2 [place] Dew per Week   Social Drivers of Health   Financial Resource Strain: Low Risk  (05/05/2024)   Overall Financial Resource Strain (CARDIA)    Difficulty of Paying Living Expenses: Not very hard  Food Insecurity: No Food Insecurity (05/05/2024)   Hunger Vital Sign    Worried About Running Out of Food in the Last Year: Never true    Ran Out of Food in the Last Year: Never true  Transportation Needs: No Transportation Needs  (05/05/2024)   PRAPARE - Administrator, Civil Service (Medical): No    Lack of Transportation (Non-Medical): No  Physical Activity: Sufficiently Active (05/05/2024)   Exercise Vital Sign    Days of Exercise per Week: 7 days    Minutes of Exercise per Session: 60 min  Stress: Stress Concern Present (05/05/2024)   [employer] of Occupational Health - Occupational Stress Questionnaire    Feeling of Stress: To some extent  Social Connections: Socially Isolated (05/05/2024)   Social Connection and Isolation Panel    Frequency of Communication with Friends and Family: More than three times a week    Frequency of Social Gatherings with Friends and Family: Twice a week    Attends Religious Services: Never    Database Administrator or Organizations: No    Attends Banker Meetings: Never    Marital Status: Widowed  Intimate Partner Violence: Not At Risk (05/19/2024)   Humiliation, Afraid, Rape, and Kick questionnaire    Fear of Current or Ex-Partner:  No    Emotionally Abused: No    Physically Abused: No    Sexually Abused: No     PHYSICAL EXAM  There were no vitals filed for this visit. There is no height or weight on file to calculate BMI.  Generalized: Well developed, in no acute distress  Cardiology: normal rate and rhythm, no murmur auscultated  Respiratory: clear to auscultation bilaterally    Neurological examination  Mentation: Alert oriented to time, place, history taking. Follows all commands speech and language fluent Cranial nerve II-XII: Pupils were equal round reactive to light. Extraocular movements were full, visual field were full on confrontational test. Facial sensation and strength were normal. Uvula tongue midline. Head turning and shoulder shrug  were normal and symmetric. Motor: The motor testing reveals 5 over 5 strength of all 4 extremities. Good symmetric motor tone is noted throughout.  Sensory: Sensory testing is intact to soft touch  on all 4 extremities. No evidence of extinction is noted.  Coordination: Cerebellar testing reveals good finger-nose-finger and heel-to-shin bilaterally.  Gait and station: Gait is normal. Tandem gait is normal. Romberg is negative. No drift is seen.  Reflexes: Deep tendon reflexes are symmetric and normal bilaterally.    DIAGNOSTIC DATA (LABS, IMAGING, TESTING) - I reviewed patient records, labs, notes, testing and imaging myself where available.  Lab Results  Component Value Date   WBC 12.0 (H) 04/20/2024   HGB 12.2 04/20/2024   HCT 39.0 04/20/2024   MCV 91.8 04/20/2024   PLT 407 (H) 04/20/2024      Component Value Date/Time   NA 140 04/20/2024 2132   NA 141 03/24/2024 1035   K 4.5 04/20/2024 2132   CL 110 04/20/2024 2132   CO2 23 04/20/2024 2132   GLUCOSE 144 (H) 04/20/2024 2132   BUN 24 (H) 04/20/2024 2132   BUN 21 03/24/2024 1035   CREATININE 1.29 (H) 04/20/2024 2132   CREATININE 1.30 (H) 09/23/2023 1019   CALCIUM  9.4 04/20/2024 2132   PROT 7.7 03/17/2024 0928   ALBUMIN 5.0 (H) 03/17/2024 0928   AST 25 03/17/2024 0928   AST 24 11/01/2022 1333   ALT 17 03/17/2024 0928   ALT 18 11/01/2022 1333   ALKPHOS 127 (H) 03/17/2024 0928   BILITOT 0.4 03/17/2024 0928   BILITOT 0.4 11/01/2022 1333   GFRNONAA 45 (L) 04/20/2024 2132   GFRNONAA >60 11/01/2022 1333   GFRAA >90 07/07/2012 0610   Lab Results  Component Value Date   CHOL 172 03/17/2024   HDL 96 03/17/2024   LDLCALC 64 03/17/2024   TRIG 59 03/17/2024   CHOLHDL 1.8 03/17/2024   Lab Results  Component Value Date   HGBA1C 5.7 (H) 05/08/2022   Lab Results  Component Value Date   VITAMINB12 1,264 (H) 10/29/2023   Lab Results  Component Value Date   TSH 1.886 10/02/2023        No data to display              08/07/2023   11:17 AM  Montreal Cognitive Assessment   Visuospatial/ Executive (0/5) 4  Naming (0/3) 3  Attention: Read list of digits (0/2) 2  Attention: Read list of letters (0/1) 1   Attention: Serial 7 subtraction starting at 100 (0/3) 1  Language: Repeat phrase (0/2) 1  Language : Fluency (0/1) 1  Abstraction (0/2) 2  Delayed Recall (0/5) 3  Orientation (0/6) 6  Total 24  Adjusted Score (based on education) 24     ASSESSMENT AND  PLAN  68 y.o. year old female  has a past medical history of AKI (acute kidney injury) (10/03/2023), Cancer (HCC) (03/30/2024), Closed fracture of right distal radius, Degeneration of lumbar intervertebral disc (01/26/2020), Depression with anxiety (10/03/2023), GAD (generalized anxiety disorder) (11/15/2021), Gastroparesis, GERD (gastroesophageal reflux disease), History of CVA (cerebrovascular accident) (10/03/2023), Hyperlipidemia LDL goal <100 (03/26/2022), Hypertension, Hypotension (10/03/2023), Irritable bowel disease, Lupus, Major depression, Morton's neuroma of left foot (08/30/2023), Near syncope (10/03/2023), Need for shingles vaccine (11/15/2021), Need for vaccination (11/15/2021), Nonrheumatic mitral valve regurgitation (05/10/2022), Osteopenia (12/14/2015), Peptic ulcer disease (12/14/2015), Recurrent major depressive disorder, in partial remission (11/15/2021), Sinus bradycardia (10/03/2023), Stage 3a chronic kidney disease (HCC) (11/15/2021), Stroke (cerebrum) (HCC) (05/07/2022), Stroke [facility]), Trigeminal neuralgia, and Visit for screening mammogram (11/15/2021). here with    No diagnosis found.  [name] ***.  Healthy lifestyle habits encouraged. *** will follow up with PCP as directed. *** will return to see me in ***, sooner if needed. *** verbalizes understanding and agreement with this plan.   No orders of the defined types were placed in this encounter.    No orders of the defined types were placed in this encounter.    Greig Forbes, MSN, FNP-C 08/11/2024, 4:17 PM  [facility] [address] [place], [place] [address] [phone]

## 2024-08-13 ENCOUNTER — Ambulatory Visit: Payer: Medicare Other | Admitting: Family Medicine

## 2024-08-13 ENCOUNTER — Encounter: Payer: Self-pay | Admitting: Family Medicine

## 2024-08-13 VITALS — BP 130/72 | HR 72 | Ht 62.0 in | Wt 159.2 lb

## 2024-08-13 DIAGNOSIS — M62838 Other muscle spasm: Secondary | ICD-10-CM

## 2024-08-13 DIAGNOSIS — I63311 Cerebral infarction due to thrombosis of right middle cerebral artery: Secondary | ICD-10-CM

## 2024-08-13 DIAGNOSIS — D6861 Antiphospholipid syndrome: Secondary | ICD-10-CM | POA: Diagnosis not present

## 2024-08-13 DIAGNOSIS — R260 Ataxic gait: Secondary | ICD-10-CM | POA: Diagnosis not present

## 2024-08-13 DIAGNOSIS — M5441 Lumbago with sciatica, right side: Secondary | ICD-10-CM

## 2024-08-13 DIAGNOSIS — M25551 Pain in right hip: Secondary | ICD-10-CM

## 2024-08-13 DIAGNOSIS — G8929 Other chronic pain: Secondary | ICD-10-CM

## 2024-08-14 ENCOUNTER — Encounter: Payer: Self-pay | Admitting: Nurse Practitioner

## 2024-08-14 ENCOUNTER — Encounter: Payer: Self-pay | Admitting: Family Medicine

## 2024-08-14 ENCOUNTER — Ambulatory Visit: Attending: Cardiovascular Disease | Admitting: Nurse Practitioner

## 2024-08-14 DIAGNOSIS — Z01810 Encounter for preprocedural cardiovascular examination: Secondary | ICD-10-CM | POA: Diagnosis not present

## 2024-08-14 NOTE — Progress Notes (Signed)
 Virtual Visit via Telephone Note   Because of Debbie Hatfield co-morbid illnesses, she is at least at moderate risk for complications without adequate follow up.  This format is felt to be most appropriate for this patient at this time.  Due to technical limitations with video connection (technology), today's appointment will be conducted as an audio only telehealth visit, and Debbie Hatfield verbally agreed to proceed in this manner.   All issues noted in this document were discussed and addressed.  No physical exam could be performed with this format.  Evaluation Performed:  Preoperative cardiovascular risk assessment _____________   Date:  08/14/2024   Patient ID:  Debbie Hatfield, DOB 1955-12-17, MRN [id number] Patient Location:  Home Provider location:   Office  Primary Care Provider:  Joshua Debby CROME, MD Primary Cardiologist:  Annabella Scarce, MD  Chief Complaint / Patient Profile   68 y.o. y/o female with a h/o resistant hypertension, CVA (stroke of middle cerebral artery 09/2023), hyperlipidemia, nonobstructive CAD, CKD, bradycardia who is pending lumbar decompression with Dr. Duwayne on date TBD and presents today for telephonic preoperative cardiovascular risk assessment.  History of Present Illness    Debbie Hatfield is a 68 y.o. female who presents via audio/video conferencing for a telehealth visit today.  Pt was last seen in cardiology clinic on 04/01/24 by Dr. Scarce.  At that time Debbie Hatfield was doing well.  The patient is now pending procedure as outlined above. Since her last visit, she denies chest pain, shortness of breath, lower extremity edema, fatigue, palpitations, melena, hematuria, hemoptysis, diaphoresis, weakness, presyncope, syncope, orthopnea, and PND. She has been limited by back pain for approximately 3 months and noninvasive therapies have not been effective. Prior to severe back pain, she was walking her dog 1.5 to 2 miles daily and doing house and yard  work regularly. She is currently able to achieve > 4 METS Activity without concerning cardiac symptoms.    Past Medical History    Past Medical History:  Diagnosis Date   AKI (acute kidney injury) 10/03/2023   Cancer (HCC) 03/30/2024   sqamous cell ca lip and back   Closed fracture of right distal radius    Degeneration of lumbar intervertebral disc 01/26/2020   Depression with anxiety 10/03/2023   GAD (generalized anxiety disorder) 11/15/2021   Gastroparesis    GERD (gastroesophageal reflux disease)    History of CVA (cerebrovascular accident) 10/03/2023   Hyperlipidemia LDL goal <100 03/26/2022   The ASCVD Risk score (Arnett DK, et al., 2019) failed to calculate for the following reasons:    The valid HDL cholesterol range is 20 to 100 mg/dL     Hypertension    Hypotension 10/03/2023   Irritable bowel disease    Lupus    Major depression    Morton's neuroma of left foot 08/30/2023   Near syncope 10/03/2023   Need for shingles vaccine 11/15/2021   Need for vaccination 11/15/2021   Nonrheumatic mitral valve regurgitation 05/10/2022   Osteopenia 12/14/2015   Peptic ulcer disease 12/14/2015   Recurrent major depressive disorder, in partial remission 11/15/2021   Sinus bradycardia 10/03/2023   Stage 3a chronic kidney disease (HCC) 11/15/2021   Estimated Creatinine Clearance: 49.7 mL/min (by C-G formula based on SCr of 1.01 mg/dL).      Estimated Creatinine Clearance: 45.9 mL/min (A) (by C-G formula based on SCr of 1.07 mg/dL (H)).      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   Stroke (HCC)  Trigeminal neuralgia    Visit for screening mammogram 11/15/2021   Past Surgical History:  Procedure Laterality Date   BUBBLE STUDY  07/05/2022   Procedure: BUBBLE STUDY;  Surgeon: Loni Soyla LABOR, MD;  Location: [facility] ENDOSCOPY;  Service: Cardiology;;   CARPAL TUNNEL RELEASE Left 05/15/2023   CHOLECYSTECTOMY     COLONOSCOPY     LOOP RECORDER IMPLANT  2021   ORIF WRIST FRACTURE Right  09/22/2020   Procedure: OPEN REDUCTION INTERNAL FIXATION (ORIF) WRIST FRACTURE;  Surgeon: Carolee Lynwood JINNY DOUGLAS, MD;  Location: [facility];  Service: Orthopedics;  Laterality: Right;    TEE WITHOUT CARDIOVERSION N/A 07/05/2022   Procedure: TRANSESOPHAGEAL ECHOCARDIOGRAM (TEE);  Surgeon: Loni Soyla LABOR, MD;  Location: [facility] ENDOSCOPY;  Service: Cardiology;  Laterality: N/A;   UPPER GASTROINTESTINAL ENDOSCOPY      Allergies  Allergies  Allergen Reactions   Plaquenil  [Hydroxychloroquine ] Other (See Comments)    Retinal damage   Penicillins Rash   Sulfa Antibiotics Nausea And Vomiting   Sulfamethoxazole-Trimethoprim Nausea And Vomiting    Home Medications    Prior to Admission medications   Medication Sig Start Date End Date Taking? Authorizing Provider  acyclovir  (ZOVIRAX ) 400 MG tablet TAKE 1 TABLET(400 MG) BY MOUTH DAILY 07/21/24   Joshua Debby CROME, MD  Cholecalciferol 125 MCG (5000 UT) capsule Take 5,000 Units by mouth daily. 02/12/24   [provider]  clopidogrel  (PLAVIX ) 75 MG tablet Take 1 tablet (75 mg total) by mouth daily. 04/29/24   Jadapalle, Sree, MD  doxepin  (SINEQUAN ) 75 MG capsule Take 1 capsule (75 mg total) by mouth at bedtime. 08/04/24 08/04/25  Ezzard Staci SAILOR, NP  DULoxetine  (CYMBALTA ) 30 MG capsule Take 1 capsule (30 mg total) by mouth daily. 08/04/24   Ezzard Staci SAILOR, NP  DULoxetine  (CYMBALTA ) 60 MG capsule Take 1 capsule (60 mg total) by mouth at bedtime. 08/04/24   Ezzard Staci SAILOR, NP  gabapentin  (NEURONTIN ) 300 MG capsule Take 600 mg by mouth 2 (two) times daily.    [provider]  ketorolac (TORADOL) 10 MG tablet Take 20 mg by mouth every 6 (six) hours as needed.    [provider]  magic mouthwash (nystatin, hydrocortisone, diphenhydrAMINE) suspension Swish and spit 5 mLs 3 (three) times daily. 08/11/24   Dolphus Reiter, MD  oxyCODONE -acetaminophen  (PERCOCET) 10-325 MG tablet Take 1 tablet by mouth every 6  (six) hours as needed for pain.    [provider]  pantoprazole  (PROTONIX ) 40 MG tablet Take 1 tablet (40 mg total) by mouth 2 (two) times daily before a meal. 04/02/24 08/13/24  Mansouraty, Aloha Raddle., MD  spironolactone  (ALDACTONE ) 25 MG tablet Take 0.5 tablets (12.5 mg total) by mouth daily. 03/20/24   Vannie Reche RAMAN, NP  telmisartan  (MICARDIS ) 40 MG tablet Take 1 tablet (40 mg total) by mouth daily. 01/27/24   Tobb, Kardie, DO  telmisartan  (MICARDIS ) 40 MG tablet Take 1 tablet (40 mg total) by mouth daily. 07/03/24   Raford Riggs, MD  tizanidine  (ZANAFLEX ) 2 MG capsule Take 4 mg by mouth at bedtime. 07/03/22   [provider]    Physical Exam    Vital Signs:  Debbie Hatfield does not have vital signs available for review today.  Given telephonic nature of communication, physical exam is limited. AAOx3. NAD. Normal affect.  Speech and respirations are unlabored.  Accessory Clinical Findings    None  Assessment & Plan    1.  Preoperative Cardiovascular Risk Assessment: According to  the Revised Cardiac Risk Index (RCRI), her Perioperative Risk of Major Cardiac Event is (%): 6.6. Her Functional Capacity in METs is: 4.95 according to the Duke Activity Status Index (DASI). Risk is elevated given history of stroke and high risk surgery, but she is doing well from a cardiac perspective. Therefore, based on ACC/AHA guidelines, the patient would be at acceptable risk for the planned procedure without further cardiovascular testing.   The patient was advised that if she develops new symptoms prior to surgery to contact our office to arrange for a follow-up visit, and she verbalized understanding.  Plavix  is not prescribed by Cardiology. Prescribed by Dr.Jadapalle. Surgeon will need to reach out to that provider for recommendations.   A copy of this note will be routed to requesting surgeon.  Time:   Today, I have spent 10 minutes with the patient with telehealth technology  discussing medical history, symptoms, and management plan.     Debbie EMERSON Bane, NP-C  08/14/2024, 10:47 AM [address], [address] [place], [place] [address] Office [phone] Fax [phone]

## 2024-08-17 ENCOUNTER — Telehealth: Payer: Self-pay

## 2024-08-17 ENCOUNTER — Ambulatory Visit

## 2024-08-17 DIAGNOSIS — R55 Syncope and collapse: Secondary | ICD-10-CM | POA: Diagnosis not present

## 2024-08-17 LAB — CUP PACEART REMOTE DEVICE CHECK
Date Time Interrogation Session: 20251102231308
Implantable Pulse Generator Implant Date: 20220728

## 2024-08-18 ENCOUNTER — Ambulatory Visit: Payer: Self-pay | Admitting: Cardiology

## 2024-08-18 ENCOUNTER — Encounter: Payer: Self-pay | Admitting: Specialist

## 2024-08-19 ENCOUNTER — Other Ambulatory Visit (HOSPITAL_COMMUNITY): Payer: Self-pay | Admitting: Specialist

## 2024-08-19 DIAGNOSIS — M25551 Pain in right hip: Secondary | ICD-10-CM

## 2024-08-19 NOTE — Progress Notes (Signed)
 Remote Loop Recorder Transmission

## 2024-08-20 ENCOUNTER — Other Ambulatory Visit (HOSPITAL_COMMUNITY): Payer: Self-pay | Admitting: Specialist

## 2024-08-20 DIAGNOSIS — M25551 Pain in right hip: Secondary | ICD-10-CM

## 2024-08-21 ENCOUNTER — Ambulatory Visit (HOSPITAL_COMMUNITY)
Admission: RE | Admit: 2024-08-21 | Discharge: 2024-08-21 | Disposition: A | Discharge status: Home / Self Care | Source: Ambulatory Visit | Attending: Specialist | Admitting: Specialist

## 2024-08-21 DIAGNOSIS — M25551 Pain in right hip: Secondary | ICD-10-CM | POA: Insufficient documentation

## 2024-08-24 ENCOUNTER — Other Ambulatory Visit: Payer: Self-pay | Admitting: Internal Medicine

## 2024-08-26 ENCOUNTER — Other Ambulatory Visit (HOSPITAL_COMMUNITY): Payer: Self-pay

## 2024-08-26 MED ORDER — DULOXETINE HCL 30 MG PO CPEP: 30.0000 mg | ORAL_CAPSULE | Freq: Every day | ORAL | 0 refills | Status: DC

## 2024-08-27 ENCOUNTER — Ambulatory Visit: Payer: Self-pay | Admitting: Orthopedic Surgery

## 2024-08-27 DIAGNOSIS — M48062 Spinal stenosis, lumbar region with neurogenic claudication: Secondary | ICD-10-CM

## 2024-08-28 NOTE — Pre-Procedure Instructions (Signed)
 Surgical Instructions   Your procedure is scheduled on September 03, 2024. Report to [facility] Main Entrance A at 5:30 A.M., then check in with the Admitting office. Any questions or running late day of surgery: call [phone]  Questions prior to your surgery date: call [phone], Monday-Friday, 8am-4pm. If you experience any cold or flu symptoms such as cough, fever, chills, shortness of breath, etc. between now and your scheduled surgery, please notify us  at the above number.     Remember:  Do not eat after midnight the night before your surgery  You may drink clear liquids until 4:30 AM the morning of your surgery.   Clear liquids allowed are: Water, Non-Citrus Juices (without pulp), Carbonated Beverages, Clear Tea (no milk, honey, etc.), Black Coffee Only (NO MILK, CREAM OR POWDERED CREAMER of any kind), and Gatorade.  Patient Instructions  The night before surgery:  No food after midnight. ONLY clear liquids after midnight  The day of surgery (if you do NOT have diabetes):  Drink ONE (1) Pre-Surgery Clear Ensure by 4:30 AM the morning of surgery. Drink in one sitting. Do not sip.  This drink was given to you during your hospital  pre-op appointment visit.  Nothing else to drink after completing the  Pre-Surgery Clear Ensure.          If you have questions, please contact your surgeon's office.    Take these medicines the morning of surgery with A SIP OF WATER: acyclovir  (ZOVIRAX )  amLODipine  (NORVASC )  DULoxetine  (CYMBALTA )  gabapentin  (NEURONTIN )  pantoprazole  (PROTONIX )    May take these medicines IF NEEDED: acetaminophen  (TYLENOL )  amLODipine  (NORVASC )  HYDROcodone -acetaminophen  (NORCO)    STOP taking your clopidogrel  (PLAVIX ) five days prior to surgery. Your last dose will be November 14th.   One week prior to surgery, STOP taking any Aspirin  (unless otherwise instructed by your surgeon) Aleve, Naproxen, Ibuprofen, Motrin, Advil, Goody's, BC's, all  herbal medications, fish oil, and non-prescription vitamins.                     Do NOT Smoke (Tobacco/Vaping) for 24 hours prior to your procedure.  If you use a CPAP at night, you may bring your mask/headgear for your overnight stay.   You will be asked to remove any contacts, glasses, piercing's, hearing aid's, dentures/partials prior to surgery. Please bring cases for these items if needed.    Patients discharged the day of surgery will not be allowed to drive home, and someone needs to stay with them for 24 hours.  SURGICAL WAITING ROOM VISITATION Patients may have no more than 2 support people in the waiting area - these visitors may rotate.   Pre-op nurse will coordinate an appropriate time for 1 ADULT support person, who may not rotate, to accompany patient in pre-op.  Children under the age of 27 must have an adult with them who is not the patient and must remain in the main waiting area with an adult.  If the patient needs to stay at the hospital during part of their recovery, the visitor guidelines for inpatient rooms apply.  Please refer to the [facility] website for the visitor guidelines for any additional information.   If you received a COVID test during your pre-op visit  it is requested that you wear a mask when out in public, stay away from anyone that may not be feeling well and notify your surgeon if you develop symptoms. If you have been in contact with anyone  that has tested positive in the last 10 days please notify you surgeon.      Pre-operative 4 CHG Bathing Instructions   You can play a key role in reducing the risk of infection after surgery. Your skin needs to be as free of germs as possible. You can reduce the number of germs on your skin by washing with CHG (chlorhexidine  gluconate) soap before surgery. CHG is an antiseptic soap that kills germs and continues to kill germs even after washing.   DO NOT use if you have an allergy to chlorhexidine /CHG or  antibacterial soaps. If your skin becomes reddened or irritated, stop using the CHG and notify one of our RNs at [phone].   Please shower with the CHG soap starting 4 days before surgery using the following schedule:     Please keep in mind the following:  DO NOT shave, including legs and underarms, starting the day of your first shower.   You may shave your face at any point before/day of surgery.  Place clean sheets on your bed the day you start using CHG soap. Use a clean washcloth (not used since being washed) for each shower. DO NOT sleep with pets once you start using the CHG.   CHG Shower Instructions:  Wash your face and private area with normal soap. If you choose to wash your hair, wash first with your normal shampoo.  After you use shampoo/soap, rinse your hair and body thoroughly to remove shampoo/soap residue.  Turn the water OFF and apply  bottle of CHG soap to a CLEAN washcloth.  Apply CHG soap ONLY FROM YOUR NECK DOWN TO YOUR TOES (washing for 3-5 minutes)  DO NOT use CHG soap on face, private areas, open wounds, or sores.  Pay special attention to the area where your surgery is being performed.  If you are having back surgery, having someone wash your back for you may be helpful. Wait 2 minutes after CHG soap is applied, then you may rinse off the CHG soap.  Pat dry with a clean towel  Put on clean clothes/pajamas   If you choose to wear lotion, please use ONLY the CHG-compatible lotions that are listed below.  Additional instructions for the day of surgery:  If you choose, you may shower the morning of surgery with an antibacterial soap.  DO NOT APPLY any lotions, deodorants, cologne, or perfumes.   Do not bring valuables to the hospital. [facility] is not responsible for any belongings/valuables. Do not wear nail polish, gel polish, artificial nails, or any other type of covering on natural nails (fingers and toes) Do not wear jewelry or makeup Put on  clean/comfortable clothes.  Please brush your teeth.  Ask your nurse before applying any prescription medications to the skin.     CHG Compatible Lotions   Aveeno Moisturizing lotion  Cetaphil Moisturizing Cream  Cetaphil Moisturizing Lotion  Clairol Herbal Essence Moisturizing Lotion, Dry Skin  Clairol Herbal Essence Moisturizing Lotion, Extra Dry Skin  Clairol Herbal Essence Moisturizing Lotion, Normal Skin  Curel Age Defying Therapeutic Moisturizing Lotion with Alpha Hydroxy  Curel Extreme Care Body Lotion  Curel Soothing Hands Moisturizing Hand Lotion  Curel Therapeutic Moisturizing Cream, Fragrance-Free  Curel Therapeutic Moisturizing Lotion, Fragrance-Free  Curel Therapeutic Moisturizing Lotion, Original Formula  Eucerin Daily Replenishing Lotion  Eucerin Dry Skin Therapy Plus Alpha Hydroxy Crme  Eucerin Dry Skin Therapy Plus Alpha Hydroxy Lotion  Eucerin Original Crme  Eucerin Original Lotion  Eucerin Plus Crme Eucerin Plus  Lotion  Eucerin TriLipid Replenishing Lotion  Keri Anti-Bacterial Hand Lotion  Keri Deep Conditioning Original Lotion Dry Skin Formula Softly Scented  Keri Deep Conditioning Original Lotion, Fragrance Free Sensitive Skin Formula  Keri Lotion Fast Absorbing Fragrance Free Sensitive Skin Formula  Keri Lotion Fast Absorbing Softly Scented Dry Skin Formula  Keri Original Lotion  Keri Skin Renewal Lotion Keri Silky Smooth Lotion  Keri Silky Smooth Sensitive Skin Lotion  Nivea Body Creamy Conditioning Oil  Nivea Body Extra Enriched Lotion  Nivea Body Original Lotion  Nivea Body Sheer Moisturizing Lotion Nivea Crme  Nivea Skin Firming Lotion  NutraDerm 30 Skin Lotion  NutraDerm Skin Lotion  NutraDerm Therapeutic Skin Cream  NutraDerm Therapeutic Skin Lotion  ProShield Protective Hand Cream  Provon moisturizing lotion  Please read over the following fact sheets that you were given.

## 2024-08-31 ENCOUNTER — Encounter (HOSPITAL_COMMUNITY): Payer: Self-pay

## 2024-08-31 ENCOUNTER — Other Ambulatory Visit: Payer: Self-pay

## 2024-08-31 ENCOUNTER — Encounter: Payer: Self-pay | Admitting: Internal Medicine

## 2024-08-31 ENCOUNTER — Ambulatory Visit (HOSPITAL_COMMUNITY)
Admission: RE | Admit: 2024-08-31 | Discharge: 2024-08-31 | Disposition: A | Discharge status: Home / Self Care | Source: Ambulatory Visit | Attending: Orthopedic Surgery | Admitting: Orthopedic Surgery

## 2024-08-31 ENCOUNTER — Encounter (HOSPITAL_COMMUNITY)
Admission: RE | Admit: 2024-08-31 | Discharge: 2024-08-31 | Disposition: A | Discharge status: Home / Self Care | Source: Ambulatory Visit | Attending: Specialist | Admitting: Specialist

## 2024-08-31 VITALS — BP 156/66 | HR 72 | Temp 98.5°F | Resp 17 | Ht 62.0 in | Wt 153.2 lb

## 2024-08-31 DIAGNOSIS — I2510 Atherosclerotic heart disease of native coronary artery without angina pectoris: Secondary | ICD-10-CM | POA: Diagnosis present

## 2024-08-31 DIAGNOSIS — M48062 Spinal stenosis, lumbar region with neurogenic claudication: Secondary | ICD-10-CM

## 2024-08-31 DIAGNOSIS — Z01818 Encounter for other preprocedural examination: Secondary | ICD-10-CM | POA: Insufficient documentation

## 2024-08-31 HISTORY — DX: Nausea with vomiting, unspecified: R11.2

## 2024-08-31 HISTORY — DX: Personal history of other diseases of the digestive system: Z87.19

## 2024-08-31 HISTORY — DX: Other complications of anesthesia, initial encounter: T88.59XA

## 2024-08-31 HISTORY — DX: Nonrheumatic mitral (valve) prolapse: I34.1

## 2024-08-31 HISTORY — DX: Unspecified osteoarthritis, unspecified site: M19.90

## 2024-08-31 LAB — CBC
HCT: 34.9 % — ABNORMAL LOW (ref 36.0–46.0)
Hemoglobin: 10.9 g/dL — ABNORMAL LOW (ref 12.0–15.0)
MCH: 29.1 pg (ref 26.0–34.0)
MCHC: 31.2 g/dL (ref 30.0–36.0)
MCV: 93.1 fL (ref 80.0–100.0)
Platelets: 408 K/uL — ABNORMAL HIGH (ref 150–400)
RBC: 3.75 MIL/uL — ABNORMAL LOW (ref 3.87–5.11)
RDW: 16.4 % — ABNORMAL HIGH (ref 11.5–15.5)
WBC: 7 K/uL (ref 4.0–10.5)
nRBC: 0 % (ref 0.0–0.2)

## 2024-08-31 LAB — BASIC METABOLIC PANEL WITH GFR
Anion gap: 9 (ref 5–15)
BUN: 27 mg/dL — ABNORMAL HIGH (ref 8–23)
CO2: 23 mmol/L (ref 22–32)
Calcium: 9.5 mg/dL (ref 8.9–10.3)
Chloride: 106 mmol/L (ref 98–111)
Creatinine, Ser: 1.54 mg/dL — ABNORMAL HIGH (ref 0.44–1.00)
GFR, Estimated: 37 mL/min — ABNORMAL LOW (ref >60)
Glucose, Bld: 89 mg/dL (ref 70–99)
Potassium: 6 mmol/L — ABNORMAL HIGH (ref 3.5–5.1)
Sodium: 138 mmol/L (ref 135–145)

## 2024-08-31 LAB — SURGICAL PCR SCREEN
MRSA, PCR: NEGATIVE
Staphylococcus aureus: NEGATIVE

## 2024-08-31 NOTE — Progress Notes (Signed)
 PCP - Dr. Debby Molt Cardiologist - Dr. Annabella Scarce - last office visit 04/01/2024 Neurologist - Dr. Charlie Nancy - last office visit 08/13/2024 with NP  PPM/ICD - Denies - Pt does have a ILR for a.fib monitoring Device Orders - n/a Rep Notified - n/a  Chest x-ray - 10/02/2023 EKG - 04/20/2024 Stress Test - 10/03/2023 ECHO - 06/12/2022 Cardiac Cath - Denies  Sleep Study - Denies CPAP - n/a  No DM  Last dose of GLP1 agonist- n/a GLP1 instructions: n/a  Blood Thinner Instructions: Pt has held Plavix  since 11/8 (misunderstanding with surgery date and was told to just stay off medication until surgery) Aspirin  Instructions: n/a  ERAS Protcol - Clear liquids until 0430 morning of surgery PRE-SURGERY Ensure or G2- Ensure given to pt with instructions  COVID TEST- n/a   Anesthesia review: Yes. Neurology and Cardiac clearance. CKD r/t Lupus, GERD with Hiatal Hernia.   Patient denies shortness of breath, fever, cough and chest pain at PAT appointment. Pt denies any respiratory illness/infection in the last two months.   All instructions explained to the patient, with a verbal understanding of the material. Patient agrees to go over the instructions while at home for a better understanding. Patient also instructed to self quarantine after being tested for COVID-19. The opportunity to ask questions was provided.

## 2024-08-31 NOTE — Progress Notes (Signed)
 Potassium result of 6.0 discussed with Lynwood Hope, PA-C. VM left with Joen Sic, OR scheduler for Dr. Duwayne with result and callback number.

## 2024-09-01 ENCOUNTER — Ambulatory Visit: Payer: Self-pay | Admitting: Orthopedic Surgery

## 2024-09-01 ENCOUNTER — Encounter: Payer: Self-pay | Admitting: Internal Medicine

## 2024-09-01 NOTE — H&P (View-Only) (Signed)
 Debbie Hatfield is an 68 y.o. female.   Chief Complaint: back and hip pain HPI: Reason for Visit: Diagnositc Results (lumbar MRI) Context: years Location (Lower Extremity): lower back pain ; leg pain bilateral, , Severity: pain level 4/10 Aggravating Factors: standing for ; walking for Alleviating Factors: rest/sitting down/lying down; heat; ice Associated Symptoms: numbness/tingling Medications: Norco 10/325; ketorolac; gabapentin ; tizanidine  or baclofen  Patient is walking with a walker and antalgic gait. Pain radiates down into her thigh and leg and down her leg to the outside of her calf and into her foot. Also having groin pain  Past Medical History:  Diagnosis Date   AKI (acute kidney injury) 10/03/2023   Arthritis    Closed fracture of right distal radius    Complication of anesthesia    Degeneration of lumbar intervertebral disc 01/26/2020   Depression with anxiety 10/03/2023   GAD (generalized anxiety disorder) 11/15/2021   Gastroparesis    GERD (gastroesophageal reflux disease)    History of CVA (cerebrovascular accident) 10/03/2023   History of hiatal hernia    Hyperlipidemia LDL goal <100 03/26/2022   The ASCVD Risk score (Arnett DK, et al., 2019) failed to calculate for the following reasons:    The valid HDL cholesterol range is 20 to 100 mg/dL     Hypertension    Hypotension 10/03/2023   Irritable bowel disease    Lupus    Major depression    Mitral valve prolapse    Morton's neuroma of left foot 08/30/2023   Near syncope 10/03/2023   Need for shingles vaccine 11/15/2021   Need for vaccination 11/15/2021   Nonrheumatic mitral valve regurgitation 05/10/2022   Osteopenia 12/14/2015   Peptic ulcer disease 12/14/2015   PONV (postoperative nausea and vomiting)    Recurrent major depressive disorder, in partial remission 11/15/2021   Sinus bradycardia 10/03/2023   Stage 3a chronic kidney disease (HCC) 11/15/2021   Estimated Creatinine Clearance: 49.7 mL/min (by C-G  formula based on SCr of 1.01 mg/dL).      Estimated Creatinine Clearance: 45.9 mL/min (A) (by C-G formula based on SCr of 1.07 mg/dL (H)).      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   Stroke [facility])    Trigeminal neuralgia    Visit for screening mammogram 11/15/2021    Past Surgical History:  Procedure Laterality Date   BUBBLE STUDY  07/05/2022   Procedure: BUBBLE STUDY;  Surgeon: Loni Soyla LABOR, MD;  Location: [facility] ENDOSCOPY;  Service: Cardiology;;   CARPAL TUNNEL RELEASE Left 05/15/2023   CHOLECYSTECTOMY     COLONOSCOPY     LOOP RECORDER IMPLANT  2021   ORIF WRIST FRACTURE Right 09/22/2020   Procedure: OPEN REDUCTION INTERNAL FIXATION (ORIF) WRIST FRACTURE;  Surgeon: Carolee Lynwood JINNY DOUGLAS, MD;  Location: [facility];  Service: Orthopedics;  Laterality: Right;    TEE WITHOUT CARDIOVERSION N/A 07/05/2022   Procedure: TRANSESOPHAGEAL ECHOCARDIOGRAM (TEE);  Surgeon: Loni Soyla LABOR, MD;  Location: [facility] ENDOSCOPY;  Service: Cardiology;  Laterality: N/A;   UPPER GASTROINTESTINAL ENDOSCOPY      Family History  Problem Relation Age of Onset   Heart block Mother        Pacemaker   Hypertension Mother    Depression Mother    Gout Mother    Macular degeneration Mother    Aneurysm Mother    Alcoholism Father    Heart failure Maternal Grandmother    Hypertension Maternal Grandmother    Congestive Heart Failure Maternal Grandmother    Diabetes  Maternal Grandmother    Congestive Heart Failure Maternal Grandfather    Colon cancer Maternal Grandfather    Stroke Paternal Grandfather    Hypertension Paternal Grandfather    Esophageal cancer Neg Hx    Stomach cancer Neg Hx    Rectal cancer Neg Hx    Social History:  reports that she has never smoked. She has been exposed to tobacco smoke. She has never used smokeless tobacco. She reports current alcohol use. She reports that she does not use drugs.  Allergies:  Allergies  Allergen Reactions   Plaquenil   [Hydroxychloroquine ] Other (See Comments)    Retinal damage   Penicillins Rash   Sulfa Antibiotics Nausea And Vomiting   Sulfamethoxazole-Trimethoprim Nausea And Vomiting    (Not in a hospital admission)   Results for orders placed or performed during the hospital encounter of 08/31/24 (from the past 48 hours)  Basic metabolic panel per protocol     Status: Abnormal   Collection Time: 08/31/24 11:17 AM  Result Value Ref Range   Sodium 138 135 - 145 mmol/L   Potassium 6.0 (H) 3.5 - 5.1 mmol/L   Chloride 106 98 - 111 mmol/L   CO2 23 22 - 32 mmol/L   Glucose, Bld 89 70 - 99 mg/dL    Comment: Glucose reference range applies only to samples taken after fasting for at least 8 hours.   BUN 27 (H) 8 - 23 mg/dL   Creatinine, Ser 8.45 (H) 0.44 - 1.00 mg/dL   Calcium  9.5 8.9 - 10.3 mg/dL   GFR, Estimated 37 (L) >60 mL/min    Comment: (NOTE) Calculated using the CKD-EPI Creatinine Equation (2021)    Anion gap 9 5 - 15    Comment: Performed at [facility], [address] [place], [place] [address]  CBC per protocol     Status: Abnormal   Collection Time: 08/31/24 11:17 AM  Result Value Ref Range   WBC 7.0 4.0 - 10.5 K/uL   RBC 3.75 (L) 3.87 - 5.11 MIL/uL   Hemoglobin 10.9 (L) 12.0 - 15.0 g/dL   HCT 65.0 (L) 63.9 - 53.9 %   MCV 93.1 80.0 - 100.0 fL   MCH 29.1 26.0 - 34.0 pg   MCHC 31.2 30.0 - 36.0 g/dL   RDW 83.5 (H) 88.4 - 84.4 %   Platelets 408 (H) 150 - 400 K/uL   nRBC 0.0 0.0 - 0.2 %    Comment: Performed at [facility], [address] [place], [place] [address]  Surgical pcr screen     Status: None   Collection Time: 08/31/24 12:57 PM   Specimen: Nasal Mucosa; Nasal Swab  Result Value Ref Range   MRSA, PCR NEGATIVE NEGATIVE   Staphylococcus aureus NEGATIVE NEGATIVE    Comment: (NOTE) The Xpert SA Assay (FDA approved for NASAL specimens in patients 61 years of age and older), is one component of a comprehensive surveillance program. It is not intended to  diagnose infection nor to guide or monitor treatment. Performed at [facility], [address], [place], [place] [address]    No results found.  Review of Systems  Constitutional: Negative.   HENT: Negative.    Eyes: Negative.   Respiratory: Negative.    Cardiovascular: Negative.   Gastrointestinal: Negative.   Endocrine: Negative.   Genitourinary: Negative.   Musculoskeletal:  Positive for back pain.  Skin: Negative.   Neurological:  Positive for weakness and numbness.  Psychiatric/Behavioral: Negative.  There were no vitals taken for this visit. Physical Exam Constitutional:      Appearance: Normal appearance.  HENT:     Head: Normocephalic and atraumatic.     Right Ear: External ear normal.     Left Ear: External ear normal.     Nose: Nose normal.     Mouth/Throat:     Pharynx: Oropharynx is clear.  Eyes:     Conjunctiva/sclera: Conjunctivae normal.  Cardiovascular:     Rate and Rhythm: Normal rate and regular rhythm.     Pulses: Normal pulses.  Pulmonary:     Effort: Pulmonary effort is normal.  Abdominal:     General: Bowel sounds are normal.  Musculoskeletal:     Cervical back: Normal range of motion.     Comments: Patient is buttock and radiating thigh pain. L4 nerve root distribution. Also L5 nerve root distribution neurologically unchanged.  She does have some pain with internal/external rotation of her hip within the groin especially with flexion  Skin:    General: Skin is warm and dry.  Neurological:     Mental Status: She is alert.    X-rays demonstrate scoliosis to space narrowing at L4-5. Moderate osteoarthritis of her hip  MRI demonstrates severe facet arthrosis with deflection of the L5 nerve root. Neuroforaminal stenosis. L4 on the right. With impingement by the superior articulating process.  CT scan of the hip demonstrates moderate osteoarthritis of the hip though anteriorly it is bone-on-bone and anterior laterally where there is  significant subchondral acetabular cyst. This was an outside MRI independently reviewed by myself  Assessment/Plan Impression:  1. Right L4-L5 radiculopathy secondary to lateral recess and foraminal stenosis due to severe facet arthrosis L4-5 right refractory 2. 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 She has failed conservative treatment. Injections no longer work. I do feel her severe facet arthrosis on the right is contributing to L5 and L4 radiculopathy with foraminal stenosis. And her underlying scoliosis.  We discussed living with her symptoms versus lateral recess decompression and foraminotomies. She would like to proceed with a I had an extensive discussion with the patient concerning the pathology relevant anatomy and treatment options. At this point exhausting conservative treatment and in the presence of a neurologic deficit we discussed microlumbar decompression. I discussed the risks and benefits including bleeding, infection, DVT, PE, anesthetic complications, worsening in their symptoms, improvement in their symptoms, C SF leakage, epidural fibrosis, need for future surgeries such as revision discectomy and lumbar fusion. I also indicated that this is an operation to basically decompress the nerve roots to allow recovery as opposed to fixing a herniated disc if it is encountered and that the incidence of recurrent chest disc herniation can approach 15%. Also that nerve root recovery is variable and may not recover completely. Any ligament or bone that is contributing to compressing the nerves will be removed as well.  I discussed the operative course including overnight in the hospital. Immediate ambulation. Follow-up in 2 weeks for suture removal. 6 weeks until healing of the herniation and surgical incision followed  by 6 weeks of reconditioning and strengthening of the core musculature. Also discussed the need to employ the concepts of disc pressure management and core motion following the surgery to minimize the risk of recurrent disc herniation. We will obtain preoperative clearance i if  necessary and proceed accordingly.  I also feel that this patient will likely need perhaps a hip procedure. When seen last by Dr. Daralyn PA they felt her back was the main pain generator. She does seem to be symptomatic today with that. Obtain an MRI of her hip prior to the surgical procedure  No history of DVT or MRSA. She is on Plavix  will need preoperative clearance.  I would also like an appoint with Dr. Hiram as I feel that she will require further attention to her hip.  Plan right hemilaminotomy foraminotomy L4-5  Darice CHRISTELLA Randy, PA-C for Dr Duwayne 09/01/2024, 9:06 AM

## 2024-09-01 NOTE — H&P (Signed)
 Debbie Hatfield is an 68 y.o. female.   Chief Complaint: back and hip pain HPI: Reason for Visit: Diagnositc Results (lumbar MRI) Context: years Location (Lower Extremity): lower back pain ; leg pain bilateral, , Severity: pain level 4/10 Aggravating Factors: standing for ; walking for Alleviating Factors: rest/sitting down/lying down; heat; ice Associated Symptoms: numbness/tingling Medications: Norco 10/325; ketorolac; gabapentin ; tizanidine  or baclofen  Patient is walking with a walker and antalgic gait. Pain radiates down into her thigh and leg and down her leg to the outside of her calf and into her foot. Also having groin pain  Past Medical History:  Diagnosis Date   AKI (acute kidney injury) 10/03/2023   Arthritis    Closed fracture of right distal radius    Complication of anesthesia    Degeneration of lumbar intervertebral disc 01/26/2020   Depression with anxiety 10/03/2023   GAD (generalized anxiety disorder) 11/15/2021   Gastroparesis    GERD (gastroesophageal reflux disease)    History of CVA (cerebrovascular accident) 10/03/2023   History of hiatal hernia    Hyperlipidemia LDL goal <100 03/26/2022   The ASCVD Risk score (Arnett DK, et al., 2019) failed to calculate for the following reasons:    The valid HDL cholesterol range is 20 to 100 mg/dL     Hypertension    Hypotension 10/03/2023   Irritable bowel disease    Lupus    Major depression    Mitral valve prolapse    Morton's neuroma of left foot 08/30/2023   Near syncope 10/03/2023   Need for shingles vaccine 11/15/2021   Need for vaccination 11/15/2021   Nonrheumatic mitral valve regurgitation 05/10/2022   Osteopenia 12/14/2015   Peptic ulcer disease 12/14/2015   PONV (postoperative nausea and vomiting)    Recurrent major depressive disorder, in partial remission 11/15/2021   Sinus bradycardia 10/03/2023   Stage 3a chronic kidney disease (HCC) 11/15/2021   Estimated Creatinine Clearance: 49.7 mL/min (by C-G  formula based on SCr of 1.01 mg/dL).      Estimated Creatinine Clearance: 45.9 mL/min (A) (by C-G formula based on SCr of 1.07 mg/dL (H)).      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   Stroke [facility])    Trigeminal neuralgia    Visit for screening mammogram 11/15/2021    Past Surgical History:  Procedure Laterality Date   BUBBLE STUDY  07/05/2022   Procedure: BUBBLE STUDY;  Surgeon: Loni Soyla LABOR, MD;  Location: [facility] ENDOSCOPY;  Service: Cardiology;;   CARPAL TUNNEL RELEASE Left 05/15/2023   CHOLECYSTECTOMY     COLONOSCOPY     LOOP RECORDER IMPLANT  2021   ORIF WRIST FRACTURE Right 09/22/2020   Procedure: OPEN REDUCTION INTERNAL FIXATION (ORIF) WRIST FRACTURE;  Surgeon: Carolee Lynwood JINNY DOUGLAS, MD;  Location: [facility];  Service: Orthopedics;  Laterality: Right;    TEE WITHOUT CARDIOVERSION N/A 07/05/2022   Procedure: TRANSESOPHAGEAL ECHOCARDIOGRAM (TEE);  Surgeon: Loni Soyla LABOR, MD;  Location: [facility] ENDOSCOPY;  Service: Cardiology;  Laterality: N/A;   UPPER GASTROINTESTINAL ENDOSCOPY      Family History  Problem Relation Age of Onset   Heart block Mother        Pacemaker   Hypertension Mother    Depression Mother    Gout Mother    Macular degeneration Mother    Aneurysm Mother    Alcoholism Father    Heart failure Maternal Grandmother    Hypertension Maternal Grandmother    Congestive Heart Failure Maternal Grandmother    Diabetes  Maternal Grandmother    Congestive Heart Failure Maternal Grandfather    Colon cancer Maternal Grandfather    Stroke Paternal Grandfather    Hypertension Paternal Grandfather    Esophageal cancer Neg Hx    Stomach cancer Neg Hx    Rectal cancer Neg Hx    Social History:  reports that she has never smoked. She has been exposed to tobacco smoke. She has never used smokeless tobacco. She reports current alcohol use. She reports that she does not use drugs.  Allergies:  Allergies  Allergen Reactions   Plaquenil   [Hydroxychloroquine ] Other (See Comments)    Retinal damage   Penicillins Rash   Sulfa Antibiotics Nausea And Vomiting   Sulfamethoxazole-Trimethoprim Nausea And Vomiting    (Not in a hospital admission)   Results for orders placed or performed during the hospital encounter of 08/31/24 (from the past 48 hours)  Basic metabolic panel per protocol     Status: Abnormal   Collection Time: 08/31/24 11:17 AM  Result Value Ref Range   Sodium 138 135 - 145 mmol/L   Potassium 6.0 (H) 3.5 - 5.1 mmol/L   Chloride 106 98 - 111 mmol/L   CO2 23 22 - 32 mmol/L   Glucose, Bld 89 70 - 99 mg/dL    Comment: Glucose reference range applies only to samples taken after fasting for at least 8 hours.   BUN 27 (H) 8 - 23 mg/dL   Creatinine, Ser 8.45 (H) 0.44 - 1.00 mg/dL   Calcium  9.5 8.9 - 10.3 mg/dL   GFR, Estimated 37 (L) >60 mL/min    Comment: (NOTE) Calculated using the CKD-EPI Creatinine Equation (2021)    Anion gap 9 5 - 15    Comment: Performed at [facility], [address] [place], [place] [address]  CBC per protocol     Status: Abnormal   Collection Time: 08/31/24 11:17 AM  Result Value Ref Range   WBC 7.0 4.0 - 10.5 K/uL   RBC 3.75 (L) 3.87 - 5.11 MIL/uL   Hemoglobin 10.9 (L) 12.0 - 15.0 g/dL   HCT 65.0 (L) 63.9 - 53.9 %   MCV 93.1 80.0 - 100.0 fL   MCH 29.1 26.0 - 34.0 pg   MCHC 31.2 30.0 - 36.0 g/dL   RDW 83.5 (H) 88.4 - 84.4 %   Platelets 408 (H) 150 - 400 K/uL   nRBC 0.0 0.0 - 0.2 %    Comment: Performed at [facility], [address] [place], [place] [address]  Surgical pcr screen     Status: None   Collection Time: 08/31/24 12:57 PM   Specimen: Nasal Mucosa; Nasal Swab  Result Value Ref Range   MRSA, PCR NEGATIVE NEGATIVE   Staphylococcus aureus NEGATIVE NEGATIVE    Comment: (NOTE) The Xpert SA Assay (FDA approved for NASAL specimens in patients 61 years of age and older), is one component of a comprehensive surveillance program. It is not intended to  diagnose infection nor to guide or monitor treatment. Performed at [facility], [address], [place], [place] [address]    No results found.  Review of Systems  Constitutional: Negative.   HENT: Negative.    Eyes: Negative.   Respiratory: Negative.    Cardiovascular: Negative.   Gastrointestinal: Negative.   Endocrine: Negative.   Genitourinary: Negative.   Musculoskeletal:  Positive for back pain.  Skin: Negative.   Neurological:  Positive for weakness and numbness.  Psychiatric/Behavioral: Negative.  There were no vitals taken for this visit. Physical Exam Constitutional:      Appearance: Normal appearance.  HENT:     Head: Normocephalic and atraumatic.     Right Ear: External ear normal.     Left Ear: External ear normal.     Nose: Nose normal.     Mouth/Throat:     Pharynx: Oropharynx is clear.  Eyes:     Conjunctiva/sclera: Conjunctivae normal.  Cardiovascular:     Rate and Rhythm: Normal rate and regular rhythm.     Pulses: Normal pulses.  Pulmonary:     Effort: Pulmonary effort is normal.  Abdominal:     General: Bowel sounds are normal.  Musculoskeletal:     Cervical back: Normal range of motion.     Comments: Patient is buttock and radiating thigh pain. L4 nerve root distribution. Also L5 nerve root distribution neurologically unchanged.  She does have some pain with internal/external rotation of her hip within the groin especially with flexion  Skin:    General: Skin is warm and dry.  Neurological:     Mental Status: She is alert.    X-rays demonstrate scoliosis to space narrowing at L4-5. Moderate osteoarthritis of her hip  MRI demonstrates severe facet arthrosis with deflection of the L5 nerve root. Neuroforaminal stenosis. L4 on the right. With impingement by the superior articulating process.  CT scan of the hip demonstrates moderate osteoarthritis of the hip though anteriorly it is bone-on-bone and anterior laterally where there is  significant subchondral acetabular cyst. This was an outside MRI independently reviewed by myself  Assessment/Plan Impression:  1. Right L4-L5 radiculopathy secondary to lateral recess and foraminal stenosis due to severe facet arthrosis L4-5 right refractory 2. 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 She has failed conservative treatment. Injections no longer work. I do feel her severe facet arthrosis on the right is contributing to L5 and L4 radiculopathy with foraminal stenosis. And her underlying scoliosis.  We discussed living with her symptoms versus lateral recess decompression and foraminotomies. She would like to proceed with a I had an extensive discussion with the patient concerning the pathology relevant anatomy and treatment options. At this point exhausting conservative treatment and in the presence of a neurologic deficit we discussed microlumbar decompression. I discussed the risks and benefits including bleeding, infection, DVT, PE, anesthetic complications, worsening in their symptoms, improvement in their symptoms, C SF leakage, epidural fibrosis, need for future surgeries such as revision discectomy and lumbar fusion. I also indicated that this is an operation to basically decompress the nerve roots to allow recovery as opposed to fixing a herniated disc if it is encountered and that the incidence of recurrent chest disc herniation can approach 15%. Also that nerve root recovery is variable and may not recover completely. Any ligament or bone that is contributing to compressing the nerves will be removed as well.  I discussed the operative course including overnight in the hospital. Immediate ambulation. Follow-up in 2 weeks for suture removal. 6 weeks until healing of the herniation and surgical incision followed  by 6 weeks of reconditioning and strengthening of the core musculature. Also discussed the need to employ the concepts of disc pressure management and core motion following the surgery to minimize the risk of recurrent disc herniation. We will obtain preoperative clearance i if  necessary and proceed accordingly.  I also feel that this patient will likely need perhaps a hip procedure. When seen last by Dr. Daralyn PA they felt her back was the main pain generator. She does seem to be symptomatic today with that. Obtain an MRI of her hip prior to the surgical procedure  No history of DVT or MRSA. She is on Plavix  will need preoperative clearance.  I would also like an appoint with Dr. Hiram as I feel that she will require further attention to her hip.  Plan right hemilaminotomy foraminotomy L4-5  Darice CHRISTELLA Randy, PA-C for Dr Duwayne 09/01/2024, 9:06 AM

## 2024-09-02 ENCOUNTER — Other Ambulatory Visit (HOSPITAL_COMMUNITY): Payer: Self-pay

## 2024-09-02 ENCOUNTER — Telehealth: Payer: Self-pay

## 2024-09-02 ENCOUNTER — Ambulatory Visit: Payer: Self-pay | Admitting: Orthopedic Surgery

## 2024-09-02 DIAGNOSIS — E875 Hyperkalemia: Secondary | ICD-10-CM

## 2024-09-02 NOTE — Telephone Encounter (Signed)
 Pt ready for scheduling for PROLIA  on or after : 10/02/24  Option# 1: Buy/Bill (Office supplied medication)  Out-of-pocket cost due at time of clinic visit: $0  Number of injection/visits approved: ---  Primary: MEDICARE Prolia  co-insurance: 0% Admin fee co-insurance: 0%  Secondary: [employer] Prolia  co-insurance:  Admin fee co-insurance:   Medical Benefit Details: Date Benefits were checked: 09/02/24 Deductible: $257 Met of $257 Required/ Coinsurance: 0%/ Admin Fee: 0%  Prior Auth: N/A PA# Expiration Date:   # of doses approved: ----------------------------------------------------------------------- Option# 2- Med Obtained from pharmacy:  Pharmacy benefit: Copay [phone] (Paid to pharmacy) Admin Fee: 0% (Pay at clinic)  Prior Auth: N/A PA# Expiration Date:   # of doses approved:   If patient wants fill through the pharmacy benefit please send prescription to: [facility], and include estimated need by date in rx notes. Pharmacy will ship medication directly to the office.  Patient NOT eligible for Prolia  Copay Card. Copay Card can make patient's cost as little as $25. Link to apply: [email or web address]  ** This summary of benefits is an estimation of the patient's out-of-pocket cost. Exact cost may very based on individual plan coverage.

## 2024-09-02 NOTE — Telephone Encounter (Signed)
 Prolia  VOB initiated via [email or web address]  Next Prolia  inj DUE: 10/02/24

## 2024-09-02 NOTE — Progress Notes (Signed)
 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  She follows with cardiology for history of resistant hypertension, HLD, nonobstructive CAD, bradycardia, syncope s/p ILR placed 2022 with no significant findings to date.  Echo 09/2023 showed LVEF 60 to 65%, grade 1 DD, normal RV systolic function, no significant valvular abnormalities.  Coronary CTA 07/2023 showed mild nonobstructive CAD.  Prior TEE 06/2022 showed small PFO, closure was not recommended.  Seen by Rosaline Doing, NP on 08/14/2024 for preop evaluation.  Per note, Preoperative Cardiovascular Risk Assessment: According to the Revised Cardiac Risk Index (RCRI), her Perioperative Risk of Major Cardiac Event is (%): 6.6. Her Functional Capacity in METs is: 4.95 according to the Duke Activity Status Index (DASI). Risk is elevated given history of stroke and high risk surgery, but she is doing well from a cardiac perspective. Therefore, based on ACC/AHA guidelines, the patient would be at acceptable risk for the planned procedure without further cardiovascular testing. The patient was advised that if she develops new symptoms prior to surgery to contact our office to arrange for a follow-up visit, and she verbalized understanding. Plavix  is not prescribed by Cardiology. Prescribed by Dr.Jadapalle. Surgeon will need to reach out to that provider for recommendations.  Follows with neurology for history of CVA in setting of antiphospholipid syndrome.  She is maintained on DAPT with aspirin  and Plavix .  She has previously been advised she can hold Plavix  5 days for surgical procedures.  Patient reports her last dose of Plavix  was on 08/22/2024 due to misunderstanding with surgery date.  Preop labs significant for potassium 6.0. Pt follows with nephrologist Dr. Dennise largely for BP management. I called and spoke with the  patient. She said Dr. Dennise had already reached out and had her hold hydrochlorothiazide  and telmisartan . He also advised her to take two doses of Lokelama. I also reached out to Dr. Cristi office to relay pt's labs. BMP will be rechecked on day of surgery.  Otherwise, preop labs significant for elevated creatinine 1.54 consistent with history of CKD, mild anemia hemoglobin 10.9.  EKG 04/20/2024: Normal sinus rhythm.  Rate 73. Minimal voltage criteria for LVH, may be normal variant. Septal infarct , age undetermined  TTE 10/03/2023: 1. Left ventricular ejection fraction, by estimation, is 60 to 65%. The  left ventricle has normal function. The left ventricle has no regional  wall motion abnormalities. There is mild left ventricular hypertrophy.  Left ventricular diastolic parameters  are consistent with Grade I diastolic dysfunction (impaired relaxation).   2. Right ventricular systolic function is normal. The right ventricular  size is normal. Tricuspid regurgitation signal is inadequate for assessing  PA pressure.   3. Left atrial size was mild to moderately dilated.   4. No evidence of mitral valve regurgitation. Moderate mitral annular  calcification.   5. The aortic valve is tricuspid. Aortic valve regurgitation is not  visualized. Aortic valve sclerosis is present, with no evidence of aortic  valve stenosis.   6. The inferior vena cava is normal in size with greater than 50%  respiratory variability, suggesting right atrial pressure of 3 mmHg.   Coronary CTA 08/01/2023: IMPRESSION: 1. Coronary calcium  score of 63. This was 74th percentile for age, sex, and race matched control.   2. Normal coronary origin with right dominance.   3. CAD-RADS 2. Mild non-obstructive CAD (25-49%). Consider non-atherosclerotic causes of chest pain. Consider preventive therapy and risk factor  modification.   4. Coronary sinus dilation. Consider TEE or Cardiac MRI if clinically  indicated.    Lynwood Geofm RIGGERS [facility] Short Stay Center/Anesthesiology Phone [phone] 09/02/2024 10:16 AM

## 2024-09-02 NOTE — Telephone Encounter (Signed)
 Debbie Hatfield

## 2024-09-02 NOTE — Anesthesia Preprocedure Evaluation (Addendum)
 Anesthesia Evaluation  Patient identified by MRN, date of birth, ID band Patient awake    Reviewed: Allergy & Precautions, NPO status , Patient's Chart, lab work & pertinent test results  History of Anesthesia Complications (+) PONV and history of anesthetic complications  Airway Mallampati: II  TM Distance: >3 FB Neck ROM: Full    Dental no notable dental hx. (+) Teeth Intact, Dental Advisory Given   Pulmonary neg pulmonary ROS   Pulmonary exam normal breath sounds clear to auscultation       Cardiovascular hypertension, Pt. on medications (-) angina + CAD  (-) Past MI Normal cardiovascular exam Rhythm:Regular Rate:Normal  TTE 10/03/2023: 1. Left ventricular ejection fraction, by estimation, is 60 to 65%. The  left ventricle has normal function. The left ventricle has no regional  wall motion abnormalities. There is mild left ventricular hypertrophy.  Left ventricular diastolic parameters  are consistent with Grade I diastolic dysfunction (impaired relaxation).   2. Right ventricular systolic function is normal. The right ventricular  size is normal. Tricuspid regurgitation signal is inadequate for assessing  PA pressure.   3. Left atrial size was mild to moderately dilated.   4. No evidence of mitral valve regurgitation. Moderate mitral annular  calcification.   5. The aortic valve is tricuspid. Aortic valve regurgitation is not  visualized. Aortic valve sclerosis is present, with no evidence of aortic  valve stenosis.   6. The inferior vena cava is normal in size with greater than 50%  respiratory variability, suggesting right atrial pressure of 3 mmHg.       Neuro/Psych  PSYCHIATRIC DISORDERS Anxiety Depression     Neuromuscular disease CVA (CVA 04/2022)    GI/Hepatic ,GERD  Medicated and Controlled,,HX of gastroparesis   Endo/Other    Renal/GU Renal InsufficiencyRenal diseaseStage 3 Lab Results      Component                 Value               Date                          K                        6.0 (H)             08/31/2024                CO2                      23                  08/31/2024                BUN                      27 (H)              08/31/2024                CREATININE               1.54 (H)            08/31/2024                GFRNONAA                 37 (L)  08/31/2024                        Musculoskeletal  (+) Arthritis ,  SLE   Abdominal   Peds  Hematology  (+) Blood dyscrasia, anemia Lab Results      Component                Value               Date                      WBC                      7.0                 08/31/2024                HGB                      10.9 (L)            08/31/2024                HCT                      34.9 (L)            08/31/2024                MCV                      93.1                08/31/2024                PLT                      408 (H)             08/31/2024              Anesthesia Other Findings All: Plaquenil , PCN, Sulfa  Reproductive/Obstetrics                              Anesthesia Physical Anesthesia Plan  ASA: 3  Anesthesia Plan: General   Post-op Pain Management: Tylenol  PO (pre-op)*, Gabapentin  PO (pre-op)*, Ketamine IV* and Dilaudid IV   Induction: Intravenous  PONV Risk Score and Plan: 4 or greater and Treatment may vary due to age or medical condition, Midazolam , Dexamethasone , Ondansetron  and Scopolamine patch - Pre-op  Airway Management Planned: Oral ETT  Additional Equipment: None  Intra-op Plan:   Post-operative Plan: Extubation in OR  Informed Consent: I have reviewed the patients History and Physical, chart, labs and discussed the procedure including the risks, benefits and alternatives for the proposed anesthesia with the patient or authorized representative who has indicated his/her understanding and acceptance.     Dental advisory  given  Plan Discussed with: CRNA and Surgeon  Anesthesia Plan Comments:          Anesthesia Quick Evaluation

## 2024-09-03 ENCOUNTER — Encounter (HOSPITAL_COMMUNITY): Payer: Self-pay | Admitting: Specialist

## 2024-09-03 ENCOUNTER — Encounter (HOSPITAL_COMMUNITY): Admission: RE | Disposition: A | Payer: Self-pay | Source: Home / Self Care | Attending: Specialist

## 2024-09-03 ENCOUNTER — Ambulatory Visit (HOSPITAL_COMMUNITY)
Admission: RE | Admit: 2024-09-03 | Discharge: 2024-09-03 | Disposition: A | Discharge status: Home / Self Care | Attending: Specialist | Admitting: Specialist

## 2024-09-03 ENCOUNTER — Ambulatory Visit (HOSPITAL_COMMUNITY)

## 2024-09-03 ENCOUNTER — Other Ambulatory Visit: Payer: Self-pay

## 2024-09-03 ENCOUNTER — Ambulatory Visit (HOSPITAL_BASED_OUTPATIENT_CLINIC_OR_DEPARTMENT_OTHER): Payer: Self-pay | Admitting: Physician Assistant

## 2024-09-03 ENCOUNTER — Ambulatory Visit (HOSPITAL_COMMUNITY): Admitting: Licensed Clinical Social Worker

## 2024-09-03 ENCOUNTER — Ambulatory Visit (HOSPITAL_COMMUNITY): Payer: Self-pay | Admitting: Physician Assistant

## 2024-09-03 DIAGNOSIS — M329 Systemic lupus erythematosus, unspecified: Secondary | ICD-10-CM | POA: Insufficient documentation

## 2024-09-03 DIAGNOSIS — I129 Hypertensive chronic kidney disease with stage 1 through stage 4 chronic kidney disease, or unspecified chronic kidney disease: Secondary | ICD-10-CM | POA: Diagnosis not present

## 2024-09-03 DIAGNOSIS — M48061 Spinal stenosis, lumbar region without neurogenic claudication: Secondary | ICD-10-CM

## 2024-09-03 DIAGNOSIS — I2510 Atherosclerotic heart disease of native coronary artery without angina pectoris: Secondary | ICD-10-CM | POA: Diagnosis not present

## 2024-09-03 DIAGNOSIS — K219 Gastro-esophageal reflux disease without esophagitis: Secondary | ICD-10-CM | POA: Diagnosis not present

## 2024-09-03 DIAGNOSIS — Z8673 Personal history of transient ischemic attack (TIA), and cerebral infarction without residual deficits: Secondary | ICD-10-CM | POA: Insufficient documentation

## 2024-09-03 DIAGNOSIS — K3184 Gastroparesis: Secondary | ICD-10-CM | POA: Insufficient documentation

## 2024-09-03 DIAGNOSIS — N1831 Chronic kidney disease, stage 3a: Secondary | ICD-10-CM

## 2024-09-03 DIAGNOSIS — Z7902 Long term (current) use of antithrombotics/antiplatelets: Secondary | ICD-10-CM | POA: Insufficient documentation

## 2024-09-03 DIAGNOSIS — M1611 Unilateral primary osteoarthritis, right hip: Secondary | ICD-10-CM | POA: Insufficient documentation

## 2024-09-03 DIAGNOSIS — M5416 Radiculopathy, lumbar region: Secondary | ICD-10-CM | POA: Diagnosis not present

## 2024-09-03 DIAGNOSIS — I25119 Atherosclerotic heart disease of native coronary artery with unspecified angina pectoris: Secondary | ICD-10-CM | POA: Diagnosis not present

## 2024-09-03 DIAGNOSIS — Z79899 Other long term (current) drug therapy: Secondary | ICD-10-CM | POA: Insufficient documentation

## 2024-09-03 DIAGNOSIS — Z7722 Contact with and (suspected) exposure to environmental tobacco smoke (acute) (chronic): Secondary | ICD-10-CM | POA: Diagnosis not present

## 2024-09-03 DIAGNOSIS — M419 Scoliosis, unspecified: Secondary | ICD-10-CM | POA: Insufficient documentation

## 2024-09-03 HISTORY — PX: DECOMPRESSIVE LUMBAR LAMINECTOMY LEVEL 1: SHX5791

## 2024-09-03 LAB — BASIC METABOLIC PANEL WITH GFR
Anion gap: 13 (ref 5–15)
BUN: 31 mg/dL — ABNORMAL HIGH (ref 8–23)
CO2: 23 mmol/L (ref 22–32)
Calcium: 9.3 mg/dL (ref 8.9–10.3)
Chloride: 100 mmol/L (ref 98–111)
Creatinine, Ser: 1.83 mg/dL — ABNORMAL HIGH (ref 0.44–1.00)
GFR, Estimated: 30 mL/min — ABNORMAL LOW (ref >60)
Glucose, Bld: 76 mg/dL (ref 70–99)
Potassium: 4 mmol/L (ref 3.5–5.1)
Sodium: 136 mmol/L (ref 135–145)

## 2024-09-03 SURGERY — DECOMPRESSIVE LUMBAR LAMINECTOMY LEVEL 1
Anesthesia: General | Site: Back | Laterality: Right

## 2024-09-03 MED ORDER — OXYCODONE HCL 5 MG PO TABS: 5.0000 mg | ORAL_TABLET | ORAL | 0 refills | Status: DC | PRN

## 2024-09-03 MED ORDER — KETAMINE HCL 50 MG/5ML IJ SOSY
PREFILLED_SYRINGE | INTRAMUSCULAR | Status: DC | PRN
  Administered 2024-09-03: 30 mg via INTRAVENOUS

## 2024-09-03 MED ORDER — THROMBIN 20000 UNITS EX SOLR: CUTANEOUS | Status: DC | PRN

## 2024-09-03 MED ORDER — DOCUSATE SODIUM 100 MG PO CAPS
100.0000 mg | ORAL_CAPSULE | Freq: Two times a day (BID) | ORAL | Status: DC
  Administered 2024-09-03: 100 mg via ORAL
  Filled 2024-09-03: qty 1

## 2024-09-03 MED ORDER — ALUM & MAG HYDROXIDE-SIMETH 200-200-20 MG/5ML PO SUSP
30.0000 mL | Freq: Four times a day (QID) | ORAL | Status: DC | PRN
Start: 2024-09-03 — End: 2024-09-03

## 2024-09-03 MED ORDER — 0.9 % SODIUM CHLORIDE (POUR BTL) OPTIME
TOPICAL | Status: DC | PRN
  Administered 2024-09-03: 1000 mL

## 2024-09-03 MED ORDER — ACETAMINOPHEN 10 MG/ML IV SOLN: 1000.0000 mg | Freq: Once | INTRAVENOUS | Status: DC | PRN

## 2024-09-03 MED ORDER — ACETAMINOPHEN 325 MG PO TABS
650.0000 mg | ORAL_TABLET | ORAL | Status: DC | PRN
  Administered 2024-09-03: 650 mg via ORAL
  Filled 2024-09-03: qty 2

## 2024-09-03 MED ORDER — ONDANSETRON HCL 4 MG/2ML IJ SOLN
INTRAMUSCULAR | Status: AC
  Filled 2024-09-03: qty 2

## 2024-09-03 MED ORDER — METHOCARBAMOL 1000 MG/10ML IJ SOLN: 500.0000 mg | Freq: Four times a day (QID) | INTRAMUSCULAR | Status: DC | PRN

## 2024-09-03 MED ORDER — TIZANIDINE HCL 4 MG PO TABS: 4.0000 mg | ORAL_TABLET | Freq: Every day | ORAL | Status: DC

## 2024-09-03 MED ORDER — ORAL CARE MOUTH RINSE: 15.0000 mL | Freq: Once | OROMUCOSAL | Status: AC

## 2024-09-03 MED ORDER — MAGNESIUM CITRATE PO SOLN
1.0000 | Freq: Once | ORAL | Status: DC | PRN
Start: 2024-09-03 — End: 2024-09-03

## 2024-09-03 MED ORDER — ROCURONIUM BROMIDE 10 MG/ML (PF) SYRINGE
PREFILLED_SYRINGE | INTRAVENOUS | Status: DC | PRN
  Administered 2024-09-03: 10 mg via INTRAVENOUS
  Administered 2024-09-03: 50 mg via INTRAVENOUS

## 2024-09-03 MED ORDER — TRANEXAMIC ACID-NACL 1000-0.7 MG/100ML-% IV SOLN
1000.0000 mg | INTRAVENOUS | Status: AC
  Administered 2024-09-03: 1000 mg via INTRAVENOUS
  Filled 2024-09-03: qty 100

## 2024-09-03 MED ORDER — ACETAMINOPHEN 10 MG/ML IV SOLN
1000.0000 mg | INTRAVENOUS | Status: AC
  Administered 2024-09-03: 1000 mg via INTRAVENOUS
  Filled 2024-09-03: qty 100

## 2024-09-03 MED ORDER — ONDANSETRON HCL 4 MG/2ML IJ SOLN
4.0000 mg | Freq: Once | INTRAMUSCULAR | Status: AC | PRN
  Administered 2024-09-03: 4 mg via INTRAVENOUS

## 2024-09-03 MED ORDER — LIDOCAINE HCL (CARDIAC) PF 100 MG/5ML IV SOSY
PREFILLED_SYRINGE | INTRAVENOUS | Status: DC | PRN
  Administered 2024-09-03: 80 mg via INTRAVENOUS

## 2024-09-03 MED ORDER — PHENYLEPHRINE HCL-NACL 20-0.9 MG/250ML-% IV SOLN
INTRAVENOUS | Status: DC | PRN
  Administered 2024-09-03: 25 ug/min via INTRAVENOUS

## 2024-09-03 MED ORDER — ACETAMINOPHEN 500 MG PO TABS: 1000.0000 mg | ORAL_TABLET | Freq: Once | ORAL | Status: DC

## 2024-09-03 MED ORDER — BUPIVACAINE-EPINEPHRINE (PF) 0.5% -1:200000 IJ SOLN
INTRAMUSCULAR | Status: AC
  Filled 2024-09-03: qty 30

## 2024-09-03 MED ORDER — PHENYLEPHRINE 80 MCG/ML (10ML) SYRINGE FOR IV PUSH (FOR BLOOD PRESSURE SUPPORT)
PREFILLED_SYRINGE | INTRAVENOUS | Status: DC | PRN
  Administered 2024-09-03: 160 ug via INTRAVENOUS
  Administered 2024-09-03 (×3): 80 ug via INTRAVENOUS

## 2024-09-03 MED ORDER — KCL IN DEXTROSE-NACL 20-5-0.9 MEQ/L-%-% IV SOLN
INTRAVENOUS | Status: DC
  Filled 2024-09-03: qty 1000

## 2024-09-03 MED ORDER — MENTHOL 3 MG MT LOZG: 1.0000 | LOZENGE | OROMUCOSAL | Status: DC | PRN

## 2024-09-03 MED ORDER — AMISULPRIDE (ANTIEMETIC) 5 MG/2ML IV SOLN
10.0000 mg | Freq: Once | INTRAVENOUS | Status: AC | PRN
  Administered 2024-09-03: 10 mg via INTRAVENOUS

## 2024-09-03 MED ORDER — ONDANSETRON HCL 4 MG/2ML IJ SOLN
INTRAMUSCULAR | Status: DC | PRN
  Administered 2024-09-03: 4 mg via INTRAVENOUS

## 2024-09-03 MED ORDER — BISACODYL 5 MG PO TBEC: 5.0000 mg | DELAYED_RELEASE_TABLET | Freq: Every day | ORAL | Status: DC | PRN

## 2024-09-03 MED ORDER — DEXAMETHASONE SOD PHOSPHATE PF 10 MG/ML IJ SOLN
INTRAMUSCULAR | Status: DC | PRN
  Administered 2024-09-03: 8 mg via INTRAVENOUS

## 2024-09-03 MED ORDER — GABAPENTIN 300 MG PO CAPS
600.0000 mg | ORAL_CAPSULE | Freq: Three times a day (TID) | ORAL | Status: DC
  Administered 2024-09-03: 600 mg via ORAL
  Filled 2024-09-03: qty 2

## 2024-09-03 MED ORDER — CEFAZOLIN SODIUM-DEXTROSE 2-4 GM/100ML-% IV SOLN
2.0000 g | Freq: Three times a day (TID) | INTRAVENOUS | Status: DC
  Administered 2024-09-03: 2 g via INTRAVENOUS
  Filled 2024-09-03: qty 100

## 2024-09-03 MED ORDER — LACTATED RINGERS IV SOLN: INTRAVENOUS | Status: DC

## 2024-09-03 MED ORDER — OXYCODONE HCL 5 MG PO TABS: 5.0000 mg | ORAL_TABLET | Freq: Once | ORAL | Status: DC | PRN

## 2024-09-03 MED ORDER — SCOPOLAMINE 1 MG/3DAYS TD PT72
1.0000 | MEDICATED_PATCH | TRANSDERMAL | Status: DC
  Administered 2024-09-03: 1 mg via TRANSDERMAL
  Filled 2024-09-03: qty 1

## 2024-09-03 MED ORDER — MIDAZOLAM HCL 2 MG/2ML IJ SOLN
INTRAMUSCULAR | Status: AC
  Filled 2024-09-03: qty 2

## 2024-09-03 MED ORDER — ONDANSETRON HCL 4 MG/2ML IJ SOLN: 4.0000 mg | Freq: Four times a day (QID) | INTRAMUSCULAR | Status: DC | PRN

## 2024-09-03 MED ORDER — HYDROMORPHONE HCL 1 MG/ML IJ SOLN: 0.5000 mg | INTRAMUSCULAR | Status: DC | PRN

## 2024-09-03 MED ORDER — IRBESARTAN 150 MG PO TABS: 150.0000 mg | ORAL_TABLET | Freq: Every day | ORAL | Status: DC

## 2024-09-03 MED ORDER — DULOXETINE HCL 60 MG PO CPEP: 60.0000 mg | ORAL_CAPSULE | Freq: Every day | ORAL | Status: DC

## 2024-09-03 MED ORDER — AMLODIPINE BESYLATE 5 MG PO TABS: 5.0000 mg | ORAL_TABLET | Freq: Every day | ORAL | Status: DC

## 2024-09-03 MED ORDER — PHENOL 1.4 % MT LIQD: 1.0000 | OROMUCOSAL | Status: DC | PRN

## 2024-09-03 MED ORDER — ACETAMINOPHEN 650 MG RE SUPP: 650.0000 mg | RECTAL | Status: DC | PRN

## 2024-09-03 MED ORDER — ONDANSETRON HCL 4 MG PO TABS: 4.0000 mg | ORAL_TABLET | Freq: Four times a day (QID) | ORAL | Status: DC | PRN

## 2024-09-03 MED ORDER — HYDROMORPHONE HCL 1 MG/ML IJ SOLN: 0.2500 mg | INTRAMUSCULAR | Status: DC | PRN

## 2024-09-03 MED ORDER — RISAQUAD PO CAPS
1.0000 | ORAL_CAPSULE | Freq: Every day | ORAL | Status: DC
  Administered 2024-09-03: 1 via ORAL
  Filled 2024-09-03: qty 1

## 2024-09-03 MED ORDER — MIDAZOLAM HCL (PF) 2 MG/2ML IJ SOLN
INTRAMUSCULAR | Status: DC | PRN
Start: 2024-09-03 — End: 2024-09-03
  Administered 2024-09-03 (×2): 1 mg via INTRAVENOUS

## 2024-09-03 MED ORDER — POLYETHYLENE GLYCOL 3350 17 G PO PACK
17.0000 g | PACK | Freq: Every day | ORAL | Status: DC
  Administered 2024-09-03: 17 g via ORAL
  Filled 2024-09-03: qty 1

## 2024-09-03 MED ORDER — PANTOPRAZOLE SODIUM 40 MG PO TBEC: 40.0000 mg | DELAYED_RELEASE_TABLET | Freq: Two times a day (BID) | ORAL | Status: DC

## 2024-09-03 MED ORDER — VITAMIN D 25 MCG (1000 UNIT) PO TABS: 5000.0000 [IU] | ORAL_TABLET | Freq: Every day | ORAL | Status: DC

## 2024-09-03 MED ORDER — DOCUSATE SODIUM 100 MG PO CAPS: 100.0000 mg | ORAL_CAPSULE | Freq: Two times a day (BID) | ORAL | 2 refills | Status: AC

## 2024-09-03 MED ORDER — POLYETHYLENE GLYCOL 3350 17 G PO PACK: 17.0000 g | PACK | Freq: Every day | ORAL | 0 refills | Status: DC

## 2024-09-03 MED ORDER — AMLODIPINE BESYLATE 5 MG PO TABS: 2.5000 mg | ORAL_TABLET | Freq: Every evening | ORAL | Status: DC | PRN

## 2024-09-03 MED ORDER — FENTANYL CITRATE (PF) 250 MCG/5ML IJ SOLN
INTRAMUSCULAR | Status: AC
  Filled 2024-09-03: qty 5

## 2024-09-03 MED ORDER — BUPIVACAINE-EPINEPHRINE 0.5% -1:200000 IJ SOLN
INTRAMUSCULAR | Status: DC | PRN
  Administered 2024-09-03: 5 mL

## 2024-09-03 MED ORDER — DOXEPIN HCL 25 MG PO CAPS
75.0000 mg | ORAL_CAPSULE | Freq: Every day | ORAL | Status: DC
  Filled 2024-09-03: qty 1

## 2024-09-03 MED ORDER — CHLORHEXIDINE GLUCONATE 0.12 % MT SOLN
15.0000 mL | Freq: Once | OROMUCOSAL | Status: AC
  Administered 2024-09-03: 15 mL via OROMUCOSAL
  Filled 2024-09-03: qty 15

## 2024-09-03 MED ORDER — IRBESARTAN 150 MG PO TABS
150.0000 mg | ORAL_TABLET | Freq: Every day | ORAL | Status: DC
  Administered 2024-09-03: 150 mg via ORAL
  Filled 2024-09-03: qty 1

## 2024-09-03 MED ORDER — OXYCODONE HCL 5 MG PO TABS
5.0000 mg | ORAL_TABLET | ORAL | Status: DC | PRN
  Filled 2024-09-03: qty 1

## 2024-09-03 MED ORDER — KETAMINE HCL 50 MG/5ML IJ SOSY
PREFILLED_SYRINGE | INTRAMUSCULAR | Status: AC
  Filled 2024-09-03: qty 5

## 2024-09-03 MED ORDER — CLOPIDOGREL BISULFATE 75 MG PO TABS: 75.0000 mg | ORAL_TABLET | Freq: Every day | ORAL | 0 refills | Status: DC

## 2024-09-03 MED ORDER — THROMBIN 20000 UNITS EX SOLR
CUTANEOUS | Status: AC
  Filled 2024-09-03: qty 20000

## 2024-09-03 MED ORDER — OXYCODONE HCL 5 MG/5ML PO SOLN: 5.0000 mg | Freq: Once | ORAL | Status: DC | PRN

## 2024-09-03 MED ORDER — AMISULPRIDE (ANTIEMETIC) 5 MG/2ML IV SOLN
INTRAVENOUS | Status: AC
Start: 2024-09-03 — End: 2024-09-03
  Filled 2024-09-03: qty 4

## 2024-09-03 MED ORDER — SUGAMMADEX SODIUM 200 MG/2ML IV SOLN
INTRAVENOUS | Status: DC | PRN
  Administered 2024-09-03: 200 mg via INTRAVENOUS

## 2024-09-03 MED ORDER — CEFAZOLIN SODIUM-DEXTROSE 2-4 GM/100ML-% IV SOLN
2.0000 g | INTRAVENOUS | Status: AC
  Administered 2024-09-03: 2 g via INTRAVENOUS
  Filled 2024-09-03: qty 100

## 2024-09-03 MED ORDER — FENTANYL CITRATE (PF) 250 MCG/5ML IJ SOLN
INTRAMUSCULAR | Status: DC | PRN
  Administered 2024-09-03 (×2): 50 ug via INTRAVENOUS

## 2024-09-03 MED ORDER — PROPOFOL 10 MG/ML IV BOLUS
INTRAVENOUS | Status: DC | PRN
  Administered 2024-09-03: 150 mg via INTRAVENOUS

## 2024-09-03 MED ORDER — ACYCLOVIR 400 MG PO TABS: 400.0000 mg | ORAL_TABLET | Freq: Every day | ORAL | Status: DC

## 2024-09-03 MED ORDER — DULOXETINE HCL 30 MG PO CPEP
30.0000 mg | ORAL_CAPSULE | Freq: Every day | ORAL | Status: DC
Start: 2024-09-04 — End: 2024-09-03

## 2024-09-03 MED ORDER — OXYCODONE HCL 5 MG PO TABS: 10.0000 mg | ORAL_TABLET | ORAL | Status: DC | PRN

## 2024-09-03 MED ORDER — PROPOFOL 10 MG/ML IV BOLUS
INTRAVENOUS | Status: AC
  Filled 2024-09-03: qty 20

## 2024-09-03 MED ORDER — METHOCARBAMOL 500 MG PO TABS
500.0000 mg | ORAL_TABLET | Freq: Four times a day (QID) | ORAL | Status: DC | PRN
Start: 2024-09-03 — End: 2024-09-03
  Filled 2024-09-03: qty 1

## 2024-09-03 SURGICAL SUPPLY — 50 items
BAG COUNTER SPONGE SURGICOUNT (BAG) ×1 IMPLANT
BAG DECANTER FOR FLEXI CONT (MISCELLANEOUS) IMPLANT
BAND RUBBER #18 3X1/16 STRL (MISCELLANEOUS) ×2 IMPLANT
BUR EGG ELITE 5.0 (BURR) IMPLANT
BUR RND DIAMOND ELITE 4.0 (BURR) IMPLANT
CLEANER TIP ELECTROSURG 2X2 (MISCELLANEOUS) ×1 IMPLANT
CNTNR URN SCR LID CUP LEK RST (MISCELLANEOUS) ×1 IMPLANT
DRAPE LAPAROTOMY 100X72X124 (DRAPES) ×1 IMPLANT
DRAPE MICROSCOPE LEICA (MISCELLANEOUS) ×1 IMPLANT
DRAPE SHEET LG 3/4 BI-LAMINATE (DRAPES) ×1 IMPLANT
DRAPE SURG 17X11 SM STRL (DRAPES) ×1 IMPLANT
DRAPE UTILITY XL STRL (DRAPES) ×1 IMPLANT
DRSG AQUACEL AG ADV 3.5X 4 (GAUZE/BANDAGES/DRESSINGS) IMPLANT
DRSG AQUACEL AG ADV 3.5X 6 (GAUZE/BANDAGES/DRESSINGS) IMPLANT
DRSG TELFA 3X8 NADH STRL (GAUZE/BANDAGES/DRESSINGS) IMPLANT
DURAPREP 26ML APPLICATOR (WOUND CARE) ×1 IMPLANT
DURASEAL SPINE SEALANT 3ML (MISCELLANEOUS) IMPLANT
ELECTRODE BLDE 4.0 EZ CLN MEGD (MISCELLANEOUS) IMPLANT
ELECTRODE REM PT RTRN 9FT ADLT (ELECTROSURGICAL) ×1 IMPLANT
GLOVE BIOGEL PI IND STRL 7.5 (GLOVE) ×1 IMPLANT
GLOVE SURG SS PI 7.0 STRL IVOR (GLOVE) ×1 IMPLANT
GLOVE SURG SS PI 8.0 STRL IVOR (GLOVE) ×2 IMPLANT
GOWN STRL REUS W/ TWL LRG LVL3 (GOWN DISPOSABLE) ×1 IMPLANT
GOWN STRL REUS W/ TWL XL LVL3 (GOWN DISPOSABLE) ×1 IMPLANT
IV CATH 14GX2 1/4 (CATHETERS) ×1 IMPLANT
KIT BASIN OR (CUSTOM PROCEDURE TRAY) ×1 IMPLANT
NDL 22X1.5 STRL (OR ONLY) (MISCELLANEOUS) ×1 IMPLANT
NDL SPNL 18GX3.5 QUINCKE PK (NEEDLE) ×2 IMPLANT
NEEDLE 22X1.5 STRL (OR ONLY) (MISCELLANEOUS) ×1 IMPLANT
NEEDLE SPNL 18GX3.5 QUINCKE PK (NEEDLE) ×3 IMPLANT
PACK LAMINECTOMY NEURO (CUSTOM PROCEDURE TRAY) ×1 IMPLANT
PATTIES SURGICAL .75X.75 (GAUZE/BANDAGES/DRESSINGS) ×1 IMPLANT
SOLUTION PRONTOSAN WOUND 350ML (IRRIGATION / IRRIGATOR) IMPLANT
SPONGE SURGIFOAM ABS GEL 100 (HEMOSTASIS) ×1 IMPLANT
SPONGE T-LAP 4X18 ~~LOC~~+RFID (SPONGE) IMPLANT
STAPLER VISISTAT (STAPLE) IMPLANT
STRIP CLOSURE SKIN 1/2X4 (GAUZE/BANDAGES/DRESSINGS) ×1 IMPLANT
SUT NURALON 4 0 TR CR/8 (SUTURE) IMPLANT
SUT PROLENE 2 0 CT2 30 (SUTURE) IMPLANT
SUT PROLENE 3 0 PS 2 (SUTURE) IMPLANT
SUT VIC AB 1 CT1 27XBRD ANTBC (SUTURE) IMPLANT
SUT VIC AB 1-0 CT2 27 (SUTURE) IMPLANT
SUT VIC AB 2-0 CT1 TAPERPNT 27 (SUTURE) IMPLANT
SUT VIC AB 2-0 CT2 27 (SUTURE) IMPLANT
SYR 3ML LL SCALE MARK (SYRINGE) ×1 IMPLANT
TOWEL GREEN STERILE (TOWEL DISPOSABLE) ×1 IMPLANT
TOWEL GREEN STERILE FF (TOWEL DISPOSABLE) ×1 IMPLANT
TRAY FOLEY MTR SLVR 16FR STAT (SET/KITS/TRAYS/PACK) ×1 IMPLANT
WIPE CHG 2% 2PK PREOPERATIVE (MISCELLANEOUS) ×1 IMPLANT
YANKAUER SUCT BULB TIP NO VENT (SUCTIONS) ×1 IMPLANT

## 2024-09-03 NOTE — Brief Op Note (Signed)
 09/03/2024  7:17 AM  PATIENT:  Debbie Hatfield  68 y.o. female  PRE-OPERATIVE DIAGNOSIS:  spinal stenosis L4-5  POST-OPERATIVE DIAGNOSIS:  * No post-op diagnosis entered *  PROCEDURE:  Procedure(s) with comments: DECOMPRESSIVE LUMBAR LAMINECTOMY LEVEL 1 (Right) - right hemilaminectomy foraminotomy L4-5  SURGEON:  Surgeons and Role:    DEWAINE Duwayne Purchase, MD - Primary  PHYSICIAN ASSISTANT:   ASSISTANTS: Bissell   ANESTHESIA:   general  EBL:  25   BLOOD ADMINISTERED:none  DRAINS: none   LOCAL MEDICATIONS USED:  MARCAINE      SPECIMEN:  No Specimen  DISPOSITION OF SPECIMEN:  N/A  COUNTS:  YES  TOURNIQUET:  * No tourniquets in log *  DICTATION: .Other Dictation: Dictation Number [id number]....  PLAN OF CARE: Admit for overnight observation  PATIENT DISPOSITION:  PACU - hemodynamically stable.   Delay start of Pharmacological VTE agent (>24hrs) due to surgical blood loss or risk of bleeding: yes

## 2024-09-03 NOTE — Anesthesia Procedure Notes (Signed)
 Procedure Name: Intubation Date/Time: 09/03/2024 7:44 AM  Performed by: Cindie Donald CROME, CRNAPre-anesthesia Checklist: Patient identified, Emergency Drugs available, Suction available and Patient being monitored Patient Re-evaluated:Patient Re-evaluated prior to induction Oxygen Delivery Method: Circle System Utilized Preoxygenation: Pre-oxygenation with 100% oxygen Induction Type: IV induction Ventilation: Mask ventilation without difficulty Laryngoscope Size: Mac and 3 Grade View: Grade I Tube type: Oral Tube size: 7.0 mm Number of attempts: 1 Airway Equipment and Method: Stylet Placement Confirmation: ETT inserted through vocal cords under direct vision, positive ETCO2 and breath sounds checked- equal and bilateral Secured at: 21 cm Tube secured with: Tape Dental Injury: Teeth and Oropharynx as per pre-operative assessment

## 2024-09-03 NOTE — Interval H&P Note (Signed)
 History and Physical Interval Note:  09/03/2024 7:17 AM  [name]  has presented today for surgery, with the diagnosis of spinal stenosis L4-5.  The various methods of treatment have been discussed with the patient and family. After consideration of risks, benefits and other options for treatment, the patient has consented to  Procedure(s) with comments: DECOMPRESSIVE LUMBAR LAMINECTOMY LEVEL 1 (Right) - right hemilaminectomy foraminotomy L4-5 as a surgical intervention.  The patient's history has been reviewed, patient examined, no change in status, stable for surgery.  I have reviewed the patient's chart and labs.  Questions were answered to the patient's satisfaction.     Debbie Hatfield Billing

## 2024-09-03 NOTE — Evaluation (Signed)
 Occupational Therapy Evaluation and Discharge Patient Details Name: ANNAHI SHORT MRN: [id number] DOB: 1956-10-11 Today's Date: 09/03/2024   History of Present Illness   Pt is a 68 yo female s/p right hemilaminotomy with foraminotomies of L4 and L5, partial medial hemifacetectomy L4-L5, right, and lysis of extensive epidural venous plexus L4-L5 right this AM by Dr. Duwayne. PHMx: connective tissue disease, gastroparesis, HTN, left trigeminal neuralgia, right posterior frontal CVA.     Clinical Impressions This 68 yo female admitted and underwent above presents to acute OT with PLOF of being independent to Mod I with all basic ADLs and mobility pta from no AD or rollator standpoint. Currently she is overall at a S-Mod I level with ADLs and mobility without any steps at home to have to do. I educated her on walking being the best thing she could do at this time and she can follow up with surgeon at first visit to see if he wants her to start an PT. No skilled PT needs identified acutely and I have made PT aware via secure chat. Acute OT will sign off.     If plan is discharge home, recommend the following:   Assistance with cooking/housework;Assist for transportation     Functional Status Assessment   Patient has had a recent decline in their functional status and demonstrates the ability to make significant improvements in function in a reasonable and predictable amount of time. (without further need for OT and no need for skilled PT identified)     Equipment Recommendations   None recommended by OT      Precautions/Restrictions   Precautions Precautions: Back Precaution Booklet Issued: Yes (comment) Required Braces or Orthoses:  (no brace per orders) Restrictions Weight Bearing Restrictions Per Provider Order: No     Mobility Bed Mobility Overal bed mobility: Independent                  Transfers Overall transfer level: Independent                  General transfer comment: Had a limp pta due to pain in RLE and this limp continues with pt stating pain is better than it was and not having spasms. Pt ambulated the whole unit with S and no AD      Balance Overall balance assessment: Mild deficits observed, not formally tested                                         ADL either performed or assessed with clinical judgement   ADL Overall ADL's : Modified independent                                             Vision Patient Visual Report: No change from baseline              Pertinent Vitals/Pain Pain Assessment Pain Assessment: 0-10 Pain Score: 3  Pain Location: incisional Pain Descriptors / Indicators: Tender, Sore Pain Intervention(s): Limited activity within patient's tolerance, Monitored during session, Premedicated before session     Extremity/Trunk Assessment Upper Extremity Assessment Upper Extremity Assessment: Overall WFL for tasks assessed           Communication Communication Communication: No apparent difficulties   Cognition Arousal: Alert Behavior During  Therapy: WFL for tasks assessed/performed Cognition: No apparent impairments                               Following commands: Intact       Cueing   Cueing Techniques: Verbal cues              Home Living Family/patient expects to be discharged to:: Private residence Living Arrangements: Parent Available Help at Discharge: Family;Available PRN/intermittently Type of Home: House Home Access: Level entry     Home Layout: One level     Bathroom Shower/Tub: Producer, Television/film/video: Standard     Home Equipment: Agricultural Consultant (2 wheels);BSC/3in1;Shower seat - built in;Hand held Stage Manager (4 wheels)          Prior Functioning/Environment Prior Level of Function : Independent/Modified Independent;Driving             Mobility Comments: Was using a  rollator at times due to pain so bad at times ADLs Comments: Mod I    OT Problem List: Decreased range of motion;Decreased strength;Impaired balance (sitting and/or standing);Pain        OT Goals(Current goals can be found in the care plan section)   Acute Rehab OT Goals Patient Stated Goal: to go home today and follow up on hip pain with Dr. Hiram soon         AM-PAC OT 6 Clicks Daily Activity     Outcome Measure Help from another person eating meals?: None Help from another person taking care of personal grooming?: None Help from another person toileting, which includes using toliet, bedpan, or urinal?: None Help from another person bathing (including washing, rinsing, drying)?: None Help from another person to put on and taking off regular upper body clothing?: None Help from another person to put on and taking off regular lower body clothing?: None 6 Click Score: 24   End of Session Nurse Communication:  (no further OT needs, nor PT needs identified)  Activity Tolerance: Patient tolerated treatment well Patient left:  (sitting EOB)  OT Visit Diagnosis: Other abnormalities of gait and mobility (R26.89);Pain Pain - Right/Left: Right Pain - part of body: Leg (and incisional pain)                Time: 1336-1401 OT Time Calculation (min): 25 min Charges:  OT General Charges $OT Visit: 1 Visit OT Evaluation $OT Eval Moderate Complexity: 1 Mod OT Treatments $Self Care/Home Management : 8-22 mins  Donny BECKER OT Acute Rehabilitation Services Office [phone]    Rodgers Dorothyann Distel 09/03/2024, 2:14 PM

## 2024-09-03 NOTE — Discharge Instructions (Signed)
 Walk As Tolerated utilizing back precautions.  No bending, twisting, or lifting.  No driving for 2 weeks.   Aquacel dressing may remain in place until follow up. May shower with aquacel dressing in place. If the dressing peels off or becomes saturated, you may remove aquacel dressing and place gauze and tape dressing which should be kept clean and dry and changed daily. Do not remove steri-strips if they are present. See Dr. Duwayne in office in 10 to 14 days. Begin taking aspirin 81mg  per day starting 4 days after your surgery if not allergic to aspirin or on another blood thinner. Walk daily even outside. Use a cane or walker only if necessary. Avoid sitting on soft sofas.

## 2024-09-03 NOTE — Op Note (Signed)
 NAME: Debbie Hatfield, Debbie Hatfield MEDICAL RECORD NO: [id number] ACCOUNT NO: [id number] DATE OF BIRTH: [birth year] FACILITY: [facility] LOCATION: [facility] PHYSICIAN: Reyes KYM Billing, MD  Operative Report   DATE OF PROCEDURE: 09/03/2024  PREOPERATIVE DIAGNOSIS:  Spinal stenosis L4-L5 right.  POSTOPERATIVE DIAGNOSES: 1.  Spinal stenosis L4-L5 right. 2.  Extensive epidural venous plexus L4-L5 right.  PROCEDURE PERFORMED: 1.  Right hemilaminotomy with foraminotomies of L4 and L5. 2.  Partial medial hemifacetectomy L4-L5, right. 3.  Lysis of extensive epidural venous plexus L4-L5 right.  ANESTHESIA:  General.  ASSISTANT:  Arlyne Randy, PA.  HISTORY:  This is a 68 year old female with right lower extremity radicular pain L4 nerve root distribution due to spinal stenosis lateral recess at L4-L5 in the foramen of L4-L5 compressing the L4 root.  She also had significant osteoarthritis of the  right hip.  We discussed the decompression of the L4 and the L5 nerve roots and then subsequent treatment for her hip arthritis.  Risk and benefits discussed including bleeding, infection, damage to neurovascular structures, no change in symptoms or  worsening symptoms, DVT, PE, anesthetic complications, etc.  DESCRIPTION OF PROCEDURE:  The patient was in the supine position after induction of adequate general anesthesia and 2 g of Kefzol .  The patient was placed prone on a Wilson frame.  All bony prominences were well padded.  The lumbar region was prepped  and draped in the usual sterile fashion.  Two 18-gauge spinal needles were utilized to localize the L4-L5 interspace, confirmed with x-ray.  An incision was made from the spinous process of L4 to L5.  Subcutaneous tissue was dissected.  Electrocautery  was utilized to achieve hemostasis.  The dorsal lumbar fascia was divided in line with the skin incision.  Paraspinal muscles were elevated from the lamina of L4-L5.  A McCulloch retractor was placed.  The operating  microscope was draped and brought into  the surgical field.  A Penfield 4 was placed in the interlaminar space, confirming the space by x-ray.  A hypertrophic facet was noted on the right.  I used a curette to skeletonize the lamina of L4 and of L5.  I then used a high-speed burr to debulk  the inferior medial lamina of L4 and the medial inferior articulating process of L4.  Approximately 30% of the medial aspect of the facet was removed.  This then exposed the superior articulating process of L5, which was very hypertrophic.  I detached  the ligamentum flavum from the cephalad edge of L5 and the caudad edge of L4.  I used a Penfield 4 to enter the epidural space in the lateral recess at L4-L5.  Protecting the L5 root, I then decompressed the lateral recess to the medial border of the  pedicle and performed a foraminotomy of L5.  There was a venous plexus here tethering the L5 root.  This was isolated with a nerve hook and then cauterized and lysed.  I then used a 2 mm Kerrison cephalad to the point of detachment of the ligamentum  flavum at the undersurface of the lamina.  I then used a straight curette to then begin removing the superior medial aspect of the superior articulating process.  I used a curette and then with a Penfield protecting the neural elements, used a 2 mm  Kerrison then to remove this process to the superior and medial border of the pedicle, performing a foraminotomy of L4 after identifying the root.  There was hypertrophic ligamentum in the foramen as well, and  this was removed, again protecting the L4  root at all times with a [facility].  Over top of the L4 root, which was compressed by this superior articulating process and the hypertrophic ligamentum, there was a vascular leash as well.  I elevated the vascular leash with a nerve hook, then cauterized  it and lysed it, as this was tethering the L4 root.  Following this, a Woodson probe passed freely at the foramen of L4, above the  pedicle of L5, and below the pedicle of L5.  There was no disk herniation noted.  A confirmatory radiograph was obtained  with a Woodson in that foramen.  Thrombin -soaked Gelfoam was placed in the laminotomy defect after it was copiously irrigated.  I placed a neuropatty over that.  Bone wax was placed on the cancellous surface.  I removed the McCulloch retractor, irrigated  the paraspinal musculature, and used bipolar cautery for strict hemostasis.  I then removed the neuropatty and the Thrombin -soaked Gelfoam.  There was no evidence of CSF leakage or active bleeding.  I then closed the dorsal lumbar fascia with 1 Vicryl  interrupted figure-of-eight suture, followed by 2-0 Vicryl sutures, and the skin with a subcuticular Prolene.  A sterile dressing was applied.  The patient was placed supine on the hospital bed, extubated without difficulty, and transported to the  recovery room in satisfactory condition.  The patient tolerated the procedure well.  There were no complications.  Assistant, Arlyne Randy, PA was used throughout the case for patient positioning, gentle intermittent nerve retraction, suction, and closure.  BLOOD LOSS:  25 mL.   PUS D: 09/03/2024 9:27:00 am T: 09/03/2024 9:39:00 am  JOB: [id number]/ [id number]

## 2024-09-03 NOTE — Progress Notes (Signed)
 PT Cancellation Note and Discharge  Patient Details Name: Debbie Hatfield MRN: [id number] DOB: 12-Nov-1955   Cancelled Treatment:    Reason Eval/Treat Not Completed: PT screened, no needs identified, will sign off. Discussed pt case with OT who reports pt is currently mobilizing at an independent level and does not require a formal PT evaluation at this time. PT signing off. If needs change, please reconsult.     Leita JONETTA Sable 09/03/2024, 2:42 PM  Leita Sable, PT, DPT Acute Rehabilitation Services Secure Chat Preferred Office: [phone]

## 2024-09-03 NOTE — Progress Notes (Signed)
 Patient alert and oriented, voided, ambulate. Surgical site clean and dry no sign of infection. D/c instructions explain and given to the patient all questions answered. Pt. D/c home per order.

## 2024-09-03 NOTE — Anesthesia Postprocedure Evaluation (Signed)
 Anesthesia Post Note  Patient: Debbie Hatfield  Procedure(s) Performed: Hemilaminectomy, Foraminotomy Lumbar four-five-right (Right: Back)     Patient location during evaluation: PACU Anesthesia Type: General Level of consciousness: awake and alert Pain management: pain level controlled Vital Signs Assessment: post-procedure vital signs reviewed and stable Respiratory status: spontaneous breathing, nonlabored ventilation, respiratory function stable and patient connected to nasal cannula oxygen Cardiovascular status: blood pressure returned to baseline and stable Postop Assessment: no apparent nausea or vomiting Anesthetic complications: no   No notable events documented.  Last Vitals:  Vitals:   09/03/24 1100 09/03/24 1138  BP: (!) 135/58 (!) 150/61  Pulse: (!) 101 100  Resp: 14 16  Temp:  36.9 C  SpO2: 93% 99%    Last Pain:  Vitals:   09/03/24 1301  TempSrc:   PainSc: 3                  [name]

## 2024-09-03 NOTE — Transfer of Care (Signed)
 Immediate Anesthesia Transfer of Care Note  Patient: Debbie Hatfield  Procedure(s) Performed: Hemilaminectomy, Foraminotomy Lumbar four-five-right (Right: Back)  Patient Location: PACU  Anesthesia Type:General  Level of Consciousness: drowsy  Airway & Oxygen Therapy: Patient Spontanous Breathing and Patient connected to nasal cannula oxygen  Post-op Assessment: Report given to RN and Post -op Vital signs reviewed and stable  Post vital signs: Reviewed and stable  Last Vitals:  Vitals Value Taken Time  BP 137/63 09/03/24 09:35  Temp 36.6 C 09/03/24 09:35  Pulse 82 09/03/24 09:36  Resp 14 09/03/24 09:36  SpO2 100 % 09/03/24 09:36  Vitals shown include unfiled device data.  Last Pain:  Vitals:   09/03/24 0618  TempSrc: Oral  PainSc: 5          Complications: No notable events documented.

## 2024-09-04 ENCOUNTER — Ambulatory Visit: Payer: Medicare Other

## 2024-09-04 ENCOUNTER — Encounter (HOSPITAL_COMMUNITY): Payer: Self-pay | Admitting: Specialist

## 2024-09-16 ENCOUNTER — Ambulatory Visit (INDEPENDENT_AMBULATORY_CARE_PROVIDER_SITE_OTHER): Admitting: Licensed Clinical Social Worker

## 2024-09-16 ENCOUNTER — Ambulatory Visit: Admitting: Internal Medicine

## 2024-09-16 DIAGNOSIS — F411 Generalized anxiety disorder: Secondary | ICD-10-CM | POA: Diagnosis not present

## 2024-09-16 DIAGNOSIS — F332 Major depressive disorder, recurrent severe without psychotic features: Secondary | ICD-10-CM | POA: Diagnosis not present

## 2024-09-16 DIAGNOSIS — F4310 Post-traumatic stress disorder, unspecified: Secondary | ICD-10-CM | POA: Diagnosis not present

## 2024-09-16 NOTE — Progress Notes (Signed)
 Virtual Visit via Video Note   I connected with Debbie Hatfield on 09/16/24 at 2:00pm by video enabled telemedicine application and verified that I am speaking with the correct person using two identifiers.   I discussed the limitations, risks, security and privacy concerns of performing an evaluation and management service by video and the availability of in person appointments. I also discussed with the patient that there may be Hatfield patient responsible charge related to this service. The patient expressed understanding and agreed to proceed.   I discussed the assessment and treatment plan with the patient. The patient was provided an opportunity to ask questions and all were answered. The patient agreed with the plan and demonstrated an understanding of the instructions.   The patient was advised to call back or seek an in-person evaluation if the symptoms worsen or if the condition fails to improve as anticipated.   I provided 45 minutes of non-face-to-face time during this encounter.     [name], LCSW, LCAS ______________________________  THERAPIST PROGRESS NOTE   Session Time: 2:00pm - 2:45pm    Location: Patient: Patient home     Provider: Home Office   Participation Level: Active    Behavioral Response: Alert, casually dressed, euthymic mood/affect     Type of Therapy:  Individual Therapy   Treatment Goals addressed: Depression/anxiety management; Medication compliance  Progress Towards Goals: Progressing      Interventions: CBT: soothing grounding skills    Summary: Debbie Hatfield is Hatfield 68 year old widowed Caucasian female that presented today with diagnoses of Major Depressive Disorder, recurrent, severe; Generalized Anxiety Disorder; and PTSD.   Suicidal/Homicidal: None; without intent or plan.     Therapist Response:  Clinician met with Debbie today for virtual therapy appointment and assessed for safety, medication compliance, and sobriety.  Debbie Hatfield presented for  today's session on time and was alert, oriented x5, with no evidence or self-report of active SI/HI or Hatfield/V hallucinations.  Debbie Hatfield denied any use of alcohol or illicit substances.  She reported compliance with medication.  Clinician inquired about Debbie Hatfield's emotional ratings today, as well as any significant changes in thoughts, feelings, or behavior since previous check-in.  Debbie Hatfield reported scores of 2/10 for depression, 0/10 for anxiety, and 0/10 for anger/irritability.  Debbie Hatfield denied experiencing any recent panic attacks or outbursts.  Debbie Hatfield reported that Hatfield recent success has been managing boundaries with her family more effectively, including on Thanksgiving when she wasn't feeling well and took Hatfield timeout to relax.  She reported that she had Hatfield surgery in her back Hatfield few weeks ago to alleviate pain in her sciatic nerve, and was temporarily prescribed pain medication during the healing process, but is now managing discomfort with Tylenol .  Debbie Hatfield reported that she wished to continue discussion on grounding techniques today.  Clinician revisited discussion on grounding techniques today with Debbie Hatfield based upon her request.  Clinician reminded Debbie Hatfield that grounding techniques can be utilized to temporarily distract from distressing thoughts, or feelings, and covered category of self-soothing grounding techniques with her today, including examples for practice such as saying kind or coping statements, thinking of favorite things, picturing people she cares about, recalling Hatfield positive poem, song, or quotation, planning Hatfield safe treat, or something to look forward to soon such as visiting the park.  Clinician inquired about which one's Debbie Hatfield found appealing, and could see herself adding to developing coping skillset during treatment.  Intervention was effective, as evidenced by Debbie engaging in discussion on the subject  and actively trying some of these techniques out in session, including reciting coping statements to  herself such as "You can do it" or "You're going to feel better"; thinking of her favorite things; looking at pictures of the children in her family she cares about; singing Hatfield song that is uplifting to her spirits such as Christmas music; treating herself to Hatfield nice meal at [employer]; or making plans for the following week that motivate her such as taking walks once she completes physical therapy. She stated I'll definitely need to practice these before Christmas.  Clinician will continue to monitor.             Plan: Follow up in 2 weeks.   Diagnosis: Major Depressive Disorder, recurrent, severe; Generalized Anxiety Disorder; and PTSD.  Collaboration of Care:   None required at this time.                                                      Patient/Guardian was advised Release of Information must be obtained prior to any record release in order to collaborate their care with an outside provider. Patient/Guardian was advised if they have not already done so to contact the registration department to sign all necessary forms in order for us  to release information regarding their care.    Consent: Patient/Guardian gives verbal consent for treatment and assignment of benefits for services provided during this visit. Patient/Guardian expressed understanding and agreed to proceed.   [name], LCSW, LCAS 09/16/24

## 2024-09-17 ENCOUNTER — Ambulatory Visit

## 2024-09-18 ENCOUNTER — Ambulatory Visit: Attending: Internal Medicine

## 2024-09-18 DIAGNOSIS — R001 Bradycardia, unspecified: Secondary | ICD-10-CM

## 2024-09-18 LAB — CUP PACEART REMOTE DEVICE CHECK
Date Time Interrogation Session: 20251204232146
Implantable Pulse Generator Implant Date: 20220728

## 2024-09-22 NOTE — Progress Notes (Signed)
 Remote Loop Recorder Transmission

## 2024-09-23 ENCOUNTER — Ambulatory Visit: Payer: Self-pay | Admitting: Cardiology

## 2024-09-30 ENCOUNTER — Ambulatory Visit (INDEPENDENT_AMBULATORY_CARE_PROVIDER_SITE_OTHER): Admitting: Licensed Clinical Social Worker

## 2024-09-30 DIAGNOSIS — F4310 Post-traumatic stress disorder, unspecified: Secondary | ICD-10-CM | POA: Diagnosis not present

## 2024-09-30 DIAGNOSIS — F332 Major depressive disorder, recurrent severe without psychotic features: Secondary | ICD-10-CM

## 2024-09-30 DIAGNOSIS — F411 Generalized anxiety disorder: Secondary | ICD-10-CM

## 2024-09-30 NOTE — Progress Notes (Signed)
 Virtual Visit via Video Note   I connected with Debbie Hatfield on 09/30/24 at 2:06pm by video enabled telemedicine application and verified that I am speaking with the correct person using two identifiers.   I discussed the limitations, risks, security and privacy concerns of performing an evaluation and management service by video and the availability of in person appointments. I also discussed with the patient that there may be a patient responsible charge related to this service. The patient expressed understanding and agreed to proceed.   I discussed the assessment and treatment plan with the patient. The patient was provided an opportunity to ask questions and all were answered. The patient agreed with the plan and demonstrated an understanding of the instructions.   The patient was advised to call back or seek an in-person evaluation if the symptoms worsen or if the condition fails to improve as anticipated.   I provided 49 minutes of non-face-to-face time during this encounter.     [name], LCSW, LCAS ______________________________  THERAPIST PROGRESS NOTE   Session Time: 2:06pm - 2:55pm   Location: Patient: Patient home     Provider: Home Office   Participation Level: Active    Behavioral Response: Alert, casually dressed, anxious mood/affect     Type of Therapy:  Individual Therapy   Treatment Goals addressed: Depression/anxiety management; Medication compliance  Progress Towards Goals: Progressing      Interventions: CBT, psychoeducation on stages of grief    Summary: Debbie Hatfield is a 68 year old widowed Caucasian female that presented today with diagnoses of Major Depressive Disorder, recurrent, severe; Generalized Anxiety Disorder; and PTSD.   Suicidal/Homicidal: None; without intent or plan.     Therapist Response:  Clinician met with Debbie today for virtual therapy session and assessed for safety, medication compliance, and sobriety.  Breanne presented  for today's appointment 6 minutes late due to connectivity issues.  She was alert, oriented x5, with no evidence or self-report of active SI/HI or A/V hallucinations.  Taneasha denied any use of alcohol or illicit substances.  She reported compliance with medication.  Clinician inquired about Jhordan's current emotional ratings, as well as any significant changes in thoughts, feelings, or behavior since last check-in.  Delorice reported scores of 4/10 for depression, 6/10 for anxiety, and 0/10 for anger/irritability.  Hiral denied experiencing any recent panic attacks or outbursts.  Meegan reported that a recent struggle has been sleeping poorly and having outbursts and panic attacks, which related to an aunt that is passing away in hospice, and drama that is occurring with connected family members upset at the situation.  Clinician expressed sympathy for Latriece's struggle, and provided her with psychoeducation on the 5 stages of grief, including denial, anger, bargaining, depression, and acceptance.  Clinician discussed how each stage can affect an individual, and provided strategies on how to faciliate healthy grieving as she processes this impending loss. Strategies provided to [facility] included taking time to allow healthy emotional expression (I.e. allowing oneself to cry when appropriate), engaging in healthy self-care activities for distraction, talking to people who can relate to the loss for support, and considering ways to make the most of remaining time with this family member.  Interventions were effective, as evidenced by Debbie actively engaging in discussion on subject, reporting that she and her family appear to be in different stages of 'pre-grieving' this loss at the moment.  Nekeisha reported that her sister is in denial due to having a historically hard time dealing with death,  and attempting to bargain to change the outcome of the situation.  Faustine reported that she has been dealing with anger and  depression recently, which explains her outbursts, sadness, and lack of sleep.  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  Keirah stated I'm going to try to give myself some grace and take it easy.  Clinician will continue to monitor.             Plan: Follow up in 2 weeks.   Diagnosis: Major Depressive Disorder, recurrent, severe; Generalized Anxiety Disorder; and PTSD.  Collaboration of Care:   None required at this time.                                                      Patient/Guardian was advised Release of Information must be obtained prior to any record release in order to collaborate their care with an outside provider. Patient/Guardian was advised if they have not already done so to contact the registration department to sign all necessary forms in order for us  to release information regarding their care.    Consent: Patient/Guardian gives verbal consent for treatment and assignment of benefits for services provided during this visit. Patient/Guardian expressed understanding and agreed to proceed.   [name], [place], LCAS 09/30/24

## 2024-10-05 ENCOUNTER — Telehealth (HOSPITAL_BASED_OUTPATIENT_CLINIC_OR_DEPARTMENT_OTHER): Payer: Self-pay | Admitting: *Deleted

## 2024-10-05 ENCOUNTER — Telehealth: Payer: Self-pay | Admitting: Internal Medicine

## 2024-10-05 ENCOUNTER — Other Ambulatory Visit: Payer: Self-pay | Admitting: *Deleted

## 2024-10-05 NOTE — Telephone Encounter (Signed)
"  ° °  Pre-operative Risk Assessment    Patient Name: Debbie Hatfield  DOB: 1956/03/04 MRN: [id number]   Date of last office visit: 04/01/24  Dr Raford Date of next office visit: None   Request for Surgical Clearance    Procedure:  Rt Total Hip Arthroplasty  Date of Surgery:  Clearance 12/16/24                               Surgeon:  Dr. Dempsey Moan Surgeon's Group or Practice Name:  [facility] Phone number:  [phone] Kerri Maze Fax number:  [phone]   Type of Clearance Requested:   - Medical  - Pharmacy:  Hold Clopidogrel  (Plavix )     Type of Anesthesia:  Choice   Additional requests/questions:    Bonney Niels Jest   10/05/2024, 1:58 PM   "

## 2024-10-05 NOTE — Telephone Encounter (Signed)
" ° °  Name: Debbie Hatfield  DOB: 1956-07-14  MRN: [id number]  Primary Cardiologist: Annabella Scarce, MD   Preoperative team, please contact this patient and set up a phone call appointment for further preoperative risk assessment. Please obtain consent and complete medication review. Thank you for your help.  I confirm that guidance regarding antiplatelet and oral anticoagulation therapy has been completed and, if necessary, noted below.  Plavix  is prescribed by a noncardiology provider therefore recommendations for holding deferred to prescribing provider.   I also confirmed the patient resides in the state of [place] . As per [facility] Medical Board telemedicine laws, the patient must reside in the state in which the provider is licensed.  Miriam Shams, FNP-C  10/05/2024, 2:17 PM [facility] [address] Office [phone] Fax [phone]    "

## 2024-10-05 NOTE — Telephone Encounter (Signed)
"  °  Patient Consent for Virtual Visit        Debbie Hatfield has provided verbal consent on 10/05/2024 for a virtual visit (video or telephone).   CONSENT FOR VIRTUAL VISIT FOR:  Debbie Hatfield  By participating in this virtual visit I agree to the following:  I hereby voluntarily request, consent and authorize [facility] and its employed or contracted physicians, physician assistants, nurse practitioners or other licensed health care professionals (the Practitioner), to provide me with telemedicine health care services (the Services) as deemed necessary by the treating Practitioner. I acknowledge and consent to receive the Services by the Practitioner via telemedicine. I understand that the telemedicine visit will involve communicating with the Practitioner through live audiovisual communication technology and the disclosure of certain medical information by electronic transmission. I acknowledge that I have been given the opportunity to request an in-person assessment or other available alternative prior to the telemedicine visit and am voluntarily participating in the telemedicine visit.  I understand that I have the right to withhold or withdraw my consent to the use of telemedicine in the course of my care at any time, without affecting my right to future care or treatment, and that the Practitioner or I may terminate the telemedicine visit at any time. I understand that I have the right to inspect all information obtained and/or recorded in the course of the telemedicine visit and may receive copies of available information for a reasonable fee.  I understand that some of the potential risks of receiving the Services via telemedicine include:  Delay or interruption in medical evaluation due to technological equipment failure or disruption; Information transmitted may not be sufficient (e.g. poor resolution of images) to allow for appropriate medical decision making by the  Practitioner; and/or  In rare instances, security protocols could fail, causing a breach of personal health information.  Furthermore, I acknowledge that it is my responsibility to provide information about my medical history, conditions and care that is complete and accurate to the best of my ability. I acknowledge that Practitioner's advice, recommendations, and/or decision may be based on factors not within their control, such as incomplete or inaccurate data provided by me or distortions of diagnostic images or specimens that may result from electronic transmissions. I understand that the practice of medicine is not an exact science and that Practitioner makes no warranties or guarantees regarding treatment outcomes. I acknowledge that a copy of this consent can be made available to me via my patient portal [facility] MyChart), or I can request a printed copy by calling the office of [facility].    I understand that my insurance will be billed for this visit.   I have read or had this consent read to me. I understand the contents of this consent, which adequately explains the benefits and risks of the Services being provided via telemedicine.  I have been provided ample opportunity to ask questions regarding this consent and the Services and have had my questions answered to my satisfaction. I give my informed consent for the services to be provided through the use of telemedicine in my medical care    "

## 2024-10-05 NOTE — Telephone Encounter (Signed)
 [facility] faxed document Surgical Clearance, to be filled out by provider. Patient requested to send it back via Fax within 7-days. Document is located in providers tray at front office.Please advise at Mobile [phone] (mobile)

## 2024-10-05 NOTE — Telephone Encounter (Signed)
 Preop tele appt scheduled, med rec and consent done.

## 2024-10-09 ENCOUNTER — Ambulatory Visit

## 2024-10-09 ENCOUNTER — Ambulatory Visit: Payer: Medicare Other

## 2024-10-09 ENCOUNTER — Other Ambulatory Visit

## 2024-10-09 DIAGNOSIS — M810 Age-related osteoporosis without current pathological fracture: Secondary | ICD-10-CM

## 2024-10-09 MED ORDER — DENOSUMAB 60 MG/ML ~~LOC~~ SOSY: 60.0000 mg | PREFILLED_SYRINGE | SUBCUTANEOUS | Status: AC

## 2024-10-09 NOTE — Addendum Note (Signed)
 Addended by: EZZARD EDSEL HERO on: 10/09/2024 10:04 AM   Modules accepted: Orders

## 2024-10-09 NOTE — Progress Notes (Signed)
 After obtaining consent, and per orders of Dr. Joshua, injection of Prolia  given by Edsel CHRISTELLA Kerns. Patient tolerated procedure well.

## 2024-10-12 ENCOUNTER — Ambulatory Visit (HOSPITAL_COMMUNITY): Admitting: Licensed Clinical Social Worker

## 2024-10-12 ENCOUNTER — Encounter (HOSPITAL_COMMUNITY): Payer: Self-pay

## 2024-10-12 NOTE — Telephone Encounter (Signed)
 Form has been completed and faxed back.

## 2024-10-13 ENCOUNTER — Other Ambulatory Visit: Payer: Self-pay | Admitting: *Deleted

## 2024-10-13 DIAGNOSIS — M3219 Other organ or system involvement in systemic lupus erythematosus: Secondary | ICD-10-CM

## 2024-10-13 DIAGNOSIS — Z79899 Other long term (current) drug therapy: Secondary | ICD-10-CM

## 2024-10-16 LAB — COMPREHENSIVE METABOLIC PANEL WITH GFR
AG Ratio: 1.8 (calc) (ref 1.0–2.5)
ALT: 10 U/L (ref 6–29)
AST: 18 U/L (ref 10–35)
Albumin: 4.2 g/dL (ref 3.6–5.1)
Alkaline phosphatase (APISO): 61 U/L (ref 37–153)
BUN/Creatinine Ratio: 16 (calc) (ref 6–22)
BUN: 25 mg/dL (ref 7–25)
CO2: 27 mmol/L (ref 20–32)
Calcium: 9.6 mg/dL (ref 8.6–10.4)
Chloride: 105 mmol/L (ref 98–110)
Creat: 1.59 mg/dL — ABNORMAL HIGH (ref 0.50–1.05)
Globulin: 2.4 g/dL (ref 1.9–3.7)
Glucose, Bld: 110 mg/dL — ABNORMAL HIGH (ref 65–99)
Potassium: 4.8 mmol/L (ref 3.5–5.3)
Sodium: 142 mmol/L (ref 135–146)
Total Bilirubin: 0.6 mg/dL (ref 0.2–1.2)
Total Protein: 6.6 g/dL (ref 6.1–8.1)
eGFR: 35 mL/min/1.73m2 — ABNORMAL LOW

## 2024-10-16 LAB — CBC WITH DIFFERENTIAL/PLATELET
Absolute Lymphocytes: 2025 {cells}/uL (ref 850–3900)
Absolute Monocytes: 572 {cells}/uL (ref 200–950)
Basophils Absolute: 32 {cells}/uL (ref 0–200)
Basophils Relative: 0.6 %
Eosinophils Absolute: 212 {cells}/uL (ref 15–500)
Eosinophils Relative: 4 %
HCT: 33.8 % — ABNORMAL LOW (ref 35.9–46.0)
Hemoglobin: 10.5 g/dL — ABNORMAL LOW (ref 11.7–15.5)
MCH: 29.2 pg (ref 27.0–33.0)
MCHC: 31.1 g/dL — ABNORMAL LOW (ref 31.6–35.4)
MCV: 93.9 fL (ref 81.4–101.7)
MPV: 10.4 fL (ref 7.5–12.5)
Monocytes Relative: 10.8 %
Neutro Abs: 2459 {cells}/uL (ref 1500–7800)
Neutrophils Relative %: 46.4 %
Platelets: 394 Thousand/uL (ref 140–400)
RBC: 3.6 Million/uL — ABNORMAL LOW (ref 3.80–5.10)
RDW: 13.2 % (ref 11.0–15.0)
Total Lymphocyte: 38.2 %
WBC: 5.3 Thousand/uL (ref 3.8–10.8)

## 2024-10-16 LAB — PROTEIN / CREATININE RATIO, URINE
Creatinine, Urine: 114 mg/dL (ref 20–275)
Protein/Creat Ratio: 61 mg/g{creat} (ref 24–184)
Protein/Creatinine Ratio: 0.061 mg/mg{creat} (ref 0.024–0.184)
Total Protein, Urine: 7 mg/dL (ref 5–24)

## 2024-10-16 LAB — ANTI-DNA ANTIBODY, DOUBLE-STRANDED: ds DNA Ab: <1 [IU]/mL

## 2024-10-16 LAB — SEDIMENTATION RATE: Sed Rate: 34 mm/h — ABNORMAL HIGH (ref 0–30)

## 2024-10-16 LAB — ANA: Anti Nuclear Antibody (ANA): NEGATIVE

## 2024-10-16 LAB — C3 AND C4
C3 Complement: 141 mg/dL (ref 83–193)
C4 Complement: 22 mg/dL (ref 15–57)

## 2024-10-18 ENCOUNTER — Ambulatory Visit: Payer: Self-pay | Admitting: Rheumatology

## 2024-10-18 NOTE — Progress Notes (Signed)
 Sed rate is elevated, GFR remains low and stable, hemoglobin is low and stable, ANA negative, double-stranded DNA negative, complements normal.  Urine protein creatinine ratio normal.  Labs do not indicate a lupus flare.

## 2024-10-19 ENCOUNTER — Encounter: Payer: Self-pay | Admitting: Rheumatology

## 2024-10-19 ENCOUNTER — Ambulatory Visit: Attending: Internal Medicine

## 2024-10-19 DIAGNOSIS — R001 Bradycardia, unspecified: Secondary | ICD-10-CM

## 2024-10-19 NOTE — Telephone Encounter (Signed)
 We can send a prescription for Magic mouthwash.

## 2024-10-20 ENCOUNTER — Ambulatory Visit: Payer: Self-pay | Admitting: Cardiology

## 2024-10-20 LAB — CUP PACEART REMOTE DEVICE CHECK
Date Time Interrogation Session: 20260104230811
Implantable Pulse Generator Implant Date: 20220728

## 2024-10-20 NOTE — Progress Notes (Signed)
 "  Office Visit Note  Patient: Debbie Hatfield             Date of Birth: [birth year]           MRN: [id number]             PCP: Joshua Debby CROME, MD Referring: Joshua Debby CROME, MD Visit Date: 11/03/2024 Occupation: Data Unavailable  Subjective:  Right hip pain   History of Present Illness: Debbie Hatfield is a 69 y.o. female with history of systemic lupus, osteoarthritis and chronic kidney disease.  She returns today after her last visit in July 2025.  She states she underwent laminectomy for lumbar spine in November 2025 by Dr. Baird.  She is gradually recovering from it.  She has been also experiencing increased pain and discomfort in her right hip.  She was evaluated by Dr.Alusio who recommended total hip replacement.  Hip replacement surgery is scheduled on December 16, 2023.  She continues to have morning stiffness, nocturnal pain, discomfort during routine activities.  She states that she developed oral ulcers last month which resolved.  There is no history of sicca symptoms, Raynaud's phenomenon, malar rash or lymphadenopathy.  She gives history of arthralgia and photosensitivity.  She has been taking Cymbalta  90 mg in the morning and doxepin  at bedtime.    Activities of Daily Living:  Patient reports morning stiffness for 1 hour.   Patient Reports nocturnal pain.  Difficulty dressing/grooming: Reports Difficulty climbing stairs: Reports Difficulty getting out of chair: Reports Difficulty using hands for taps, buttons, cutlery, and/or writing: Denies  Review of Systems  Constitutional:  Positive for fatigue.  HENT:  Positive for mouth sores. Negative for mouth dryness.   Eyes:  Negative for dryness.  Respiratory:  Negative for shortness of breath.   Cardiovascular:  Negative for chest pain and palpitations.  Gastrointestinal:  Negative for blood in stool, constipation and diarrhea.  Endocrine: Negative for increased urination.  Genitourinary:  Negative for involuntary urination.   Musculoskeletal:  Positive for joint pain, gait problem, joint pain, myalgias, morning stiffness, muscle tenderness and myalgias. Negative for joint swelling and muscle weakness.  Skin:  Positive for sensitivity to sunlight. Negative for color change, rash and hair loss.  Allergic/Immunologic: Negative for susceptible to infections.  Neurological:  Negative for dizziness and headaches.  Hematological:  Negative for swollen glands.  Psychiatric/Behavioral:  Positive for depressed mood and sleep disturbance. The patient is nervous/anxious.     PMFS History:  Patient Active Problem List   Diagnosis Date Noted   Spinal stenosis at L4-L5 level 09/03/2024   Screening mammogram for breast cancer 05/05/2024   Need for prophylactic vaccination and inoculation against varicella 05/05/2024   Major depressive disorder with current active episode 04/21/2024   Age-related osteoporosis without current pathological fracture 03/16/2024   Sinus bradycardia 10/03/2023   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   Hyperlipidemia LDL goal <100 03/26/2022   Recurrent major depressive disorder, in partial remission 11/15/2021   GAD (generalized anxiety disorder) 11/15/2021   Stage 3a chronic kidney disease (HCC) 11/15/2021   Trigeminal neuralgia    Lupus    Irritable bowel disease    GERD (gastroesophageal reflux disease)    Hypertension    Gastroparesis    Degeneration of lumbar intervertebral disc 01/26/2020   Peptic ulcer disease 12/14/2015    Past Medical History:  Diagnosis Date   AKI (acute kidney injury) 10/03/2023   Arthritis    Closed fracture of right distal radius  Complication of anesthesia    Degeneration of lumbar intervertebral disc 01/26/2020   Depression with anxiety 10/03/2023   GAD (generalized anxiety disorder) 11/15/2021   Gastroparesis    GERD (gastroesophageal reflux disease)    History of CVA (cerebrovascular accident) 10/03/2023   History of hiatal hernia    Hyperlipidemia  LDL goal <100 03/26/2022   The ASCVD Risk score (Arnett DK, et al., 2019) failed to calculate for the following reasons:    The valid HDL cholesterol range is 20 to 100 mg/dL     Hypertension    Hypotension 10/03/2023   Irritable bowel disease    Lupus    Major depression    Mitral valve prolapse    Morton's neuroma of left foot 08/30/2023   Near syncope 10/03/2023   Need for shingles vaccine 11/15/2021   Need for vaccination 11/15/2021   Nonrheumatic mitral valve regurgitation 05/10/2022   Osteopenia 12/14/2015   Peptic ulcer disease 12/14/2015   PONV (postoperative nausea and vomiting)    Recurrent major depressive disorder, in partial remission 11/15/2021   Sinus bradycardia 10/03/2023   Stage 3a chronic kidney disease (HCC) 11/15/2021   Estimated Creatinine Clearance: 49.7 mL/min (by C-G formula based on SCr of 1.01 mg/dL).      Estimated Creatinine Clearance: 45.9 mL/min (A) (by C-G formula based on SCr of 1.07 mg/dL (H)).      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   Stroke [facility])    Trigeminal neuralgia    Visit for screening mammogram 11/15/2021    Family History  Problem Relation Age of Onset   Heart block Mother        Pacemaker   Hypertension Mother    Depression Mother    Gout Mother    Macular degeneration Mother    Aneurysm Mother    Alcoholism Father    Heart failure Maternal Grandmother    Hypertension Maternal Grandmother    Congestive Heart Failure Maternal Grandmother    Diabetes Maternal Grandmother    Congestive Heart Failure Maternal Grandfather    Colon cancer Maternal Grandfather    Stroke Paternal Grandfather    Hypertension Paternal Grandfather    Esophageal cancer Neg Hx    Stomach cancer Neg Hx    Rectal cancer Neg Hx    Past Surgical History:  Procedure Laterality Date   BUBBLE STUDY  07/05/2022   Procedure: BUBBLE STUDY;  Surgeon: Loni Soyla LABOR, MD;  Location: [facility] ENDOSCOPY;  Service: Cardiology;;   CARPAL TUNNEL RELEASE Left 05/15/2023    CHOLECYSTECTOMY     COLONOSCOPY     DECOMPRESSIVE LUMBAR LAMINECTOMY LEVEL 1 Right 09/03/2024   Procedure: Hemilaminectomy, Foraminotomy Lumbar four-five-right;  Surgeon: Duwayne Purchase, MD;  Location: [facility] OR;  Service: Orthopedics;  Laterality: Right;   LOOP RECORDER IMPLANT  2021   ORIF WRIST FRACTURE Right 09/22/2020   Procedure: OPEN REDUCTION INTERNAL FIXATION (ORIF) WRIST FRACTURE;  Surgeon: Carolee Lynwood JINNY DOUGLAS, MD;  Location: [facility];  Service: Orthopedics;  Laterality: Right;    TEE WITHOUT CARDIOVERSION N/A 07/05/2022   Procedure: TRANSESOPHAGEAL ECHOCARDIOGRAM (TEE);  Surgeon: Loni Soyla LABOR, MD;  Location: [facility] ENDOSCOPY;  Service: Cardiology;  Laterality: N/A;   UPPER GASTROINTESTINAL ENDOSCOPY     Social History[1] Social History   Social History Narrative   06/13/22 living with her mother   2 [place] [employer] per [employer] History  Administered Date(s) Administered   Influenza,inj,Quad PF,6-35 Mos 06/17/2019   Influenza-Unspecified 09/02/2020, 07/27/2021, 07/15/2022  Moderna SARS-COV2 Booster Vaccination 09/02/2020   Moderna Sars-Covid-2 Vaccination 10/23/2019, 11/20/2019   PNEUMOCOCCAL CONJUGATE-20 11/15/2021   Tdap 03/28/2014, 12/29/2020     Objective: Vital Signs: BP (!) 165/81   Pulse 79   Temp 97.8 F (36.6 C)   Resp 14   Ht 5' 2 (1.575 m)   Wt 152 lb 6.4 oz (69.1 kg)   BMI 27.87 kg/m    Physical Exam Vitals and nursing note reviewed.  Constitutional:      Appearance: She is well-developed.  HENT:     Head: Normocephalic and atraumatic.  Eyes:     Conjunctiva/sclera: Conjunctivae normal.  Cardiovascular:     Rate and Rhythm: Normal rate and regular rhythm.     Heart sounds: Normal heart sounds.  Pulmonary:     Effort: Pulmonary effort is normal.     Breath sounds: Normal breath sounds.  Abdominal:     General: Bowel sounds are normal.     Palpations: Abdomen is soft.  Musculoskeletal:     Cervical  back: Normal range of motion.  Lymphadenopathy:     Cervical: No cervical adenopathy.  Skin:    General: Skin is warm and dry.     Capillary Refill: Capillary refill takes less than 2 seconds.  Neurological:     Mental Status: She is alert and oriented to person, place, and time.  Psychiatric:        Behavior: Behavior normal.      Musculoskeletal Exam: Cervical was in good range of motion.  She had limited range of motion of the lumbar spine.  There was no SI joint tenderness.  Shoulder joints, elbow joints, wrist joints, MCPs, PIPs and DIPs were in good range of motion with no synovitis.  She had limited painful range of motion of her right hip joint.  Left hip joint, knee joints were in good range of motion without any warmth swelling or effusion.  There was no tenderness over ankles or MTPs.   CDAI Exam: CDAI Score: -- Patient Global: --; Provider Global: -- Swollen: --; Tender: -- Joint Exam 11/03/2024   No joint exam has been documented for this visit   There is currently no information documented on the homunculus. Go to the Rheumatology activity and complete the homunculus joint exam.  Investigation: No additional findings.  Imaging: CUP PACEART REMOTE DEVICE CHECK Result Date: 10/20/2024 ILR summary report received. Battery status OK. Normal device function. No new symptom, tachy, brady, or pause episodes. No new AF episodes. Monthly summary reports and ROV/PRN LA, CVRS   Recent Labs: Lab Results  Component Value Date   WBC 5.3 10/13/2024   HGB 10.5 (L) 10/13/2024   PLT 394 10/13/2024   NA 142 10/13/2024   K 4.8 10/13/2024   CL 105 10/13/2024   CO2 27 10/13/2024   GLUCOSE 110 (H) 10/13/2024   BUN 25 10/13/2024   CREATININE 1.59 (H) 10/13/2024   BILITOT 0.6 10/13/2024   ALKPHOS 127 (H) 03/17/2024   AST 18 10/13/2024   ALT 10 10/13/2024   PROT 6.6 10/13/2024   ALBUMIN 5.0 (H) 03/17/2024   CALCIUM  9.6 10/13/2024   GFRAA >90 07/07/2012   QFTBGOLDPLUS NEGATIVE  06/22/2022    Speciality Comments: Ocular toxicity from Plaquenil  use MTX 6 tablets p.o. weekly caused muscle pain Imuran -myalgias  Procedures:  No procedures performed Allergies: Plaquenil  [hydroxychloroquine ], Penicillins, Sulfa antibiotics, and Sulfamethoxazole-trimethoprim   Assessment / Plan:     Visit Diagnoses: Other organ or system involvement in systemic lupus erythematosus (  HCC) - dxd at age 50.H/o +ANA, fatigue and myalgias.  PLQ discontinued in 2023 due to ocular toxicity: Her disease has been in remission without any flares.  Patient developed recent episode of oral ulcers.  Labs obtained did not show active disease.  No synovitis was noted on the examination.  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 Federico.  Repeat test negative in December 2024.  Patient is on Plavix .  Primary osteoarthritis of both hands- PIP and DIP thickening noted.  She has some discomfort in the [facility] region.  Joint protection was discussed.  Trochanteric bursitis, right hip-she continues to have pain in the right trochanteric region.  Osteoarthritis of right hip-patient was recently diagnosed with osteoarthritis of her right hip joint.  She was evaluated by Dr.Alusio and will be undergoing her right total hip replacement on December 15, 2024.  Chronic pain of both knees-she continues to have some discomfort.  No warmth swelling or effusion was noted.  Pain in both feet-proper fitting shoes were advised.  Degeneration of intervertebral disc of lumbar region without discogenic back pain or lower extremity pain-she underwent laminectomy in November 2025 with relief and radiculopathy.  Osteoporosis-03/06/24 RIGHT FEMORAL NECK:BMD (in g/cm2): 0.679,T-score: -2.6. She has been on Prolia  since 2025 by her PCP.  Calcium  rich diet, vitamin D  and exercise was emphasized.  Cerebrovascular  accident (CVA) due to thrombosis of right middle cerebral artery (HCC) - she is on Plavix  by hematology.  Primary hypertension-blood pressure was elevated at 165/81.  She was advised to monitor blood pressure closely and follow-up with her PCP.  Other medical problems are listed as follows:  Hyperlipidemia LDL goal <100  Nonrheumatic mitral valve regurgitation  Stage 3a chronic kidney disease (HCC)  Peptic ulcer disease  History of gastroesophageal reflux (GERD)  Gastroparesis  History of IBS  Trigeminal neuralgia  GAD (generalized anxiety disorder)  Recurrent major depressive disorder, in partial remission  Anemia, chronic disease  Orders: No orders of the defined types were placed in this encounter.  No orders of the defined types were placed in this encounter.    Follow-Up Instructions: Return in about 6 months (around 05/03/2025) for Osteoarthritis, Systemic lupus, Osteoporosis.   Maya Nash, MD  Note - This record has been created using Animal nutritionist.  Chart creation errors have been sought, but may not always  have been located. Such creation errors do not reflect on  the standard of medical care.     [1]  Social History Tobacco Use   Smoking status: Never    Passive exposure: Past   Smokeless tobacco: Never  Vaping Use   Vaping status: Never Used  Substance Use Topics   Alcohol use: Yes    Comment: social   Drug use: Never   "

## 2024-10-22 ENCOUNTER — Encounter: Payer: Self-pay | Admitting: Internal Medicine

## 2024-10-23 NOTE — Progress Notes (Signed)
 Remote Loop Recorder Transmission

## 2024-10-28 ENCOUNTER — Other Ambulatory Visit: Payer: Self-pay | Admitting: Neurology

## 2024-10-28 ENCOUNTER — Encounter: Payer: Self-pay | Admitting: Family Medicine

## 2024-10-28 ENCOUNTER — Telehealth: Payer: Self-pay

## 2024-10-28 MED ORDER — CLOPIDOGREL BISULFATE 75 MG PO TABS: 75.0000 mg | ORAL_TABLET | Freq: Every day | ORAL | 0 refills | Status: AC

## 2024-10-28 NOTE — Telephone Encounter (Signed)
 Debbie Hatfield

## 2024-10-29 NOTE — Telephone Encounter (Signed)
 Rx was sent on 10/28/24

## 2024-11-02 ENCOUNTER — Telehealth (HOSPITAL_COMMUNITY): Admitting: Family

## 2024-11-02 DIAGNOSIS — F411 Generalized anxiety disorder: Secondary | ICD-10-CM | POA: Diagnosis not present

## 2024-11-02 DIAGNOSIS — F332 Major depressive disorder, recurrent severe without psychotic features: Secondary | ICD-10-CM

## 2024-11-02 MED ORDER — DULOXETINE HCL 30 MG PO CPEP: 30.0000 mg | ORAL_CAPSULE | Freq: Every day | ORAL | 1 refills | Status: AC

## 2024-11-02 MED ORDER — DULOXETINE HCL 60 MG PO CPEP: 60.0000 mg | ORAL_CAPSULE | Freq: Every day | ORAL | 1 refills | Status: AC

## 2024-11-02 MED ORDER — DOXEPIN HCL 75 MG PO CAPS: 75.0000 mg | ORAL_CAPSULE | Freq: Every day | ORAL | 1 refills | Status: AC

## 2024-11-02 NOTE — Progress Notes (Signed)
 Virtual Visit via Video Note  I connected with JOCEE KISSICK on 11/02/24 at  1:00 PM EST by a video enabled telemedicine application and verified that I am speaking with the correct person using two identifiers.  Location: Patient: Home Provider: Office   I discussed the limitations of evaluation and management by telemedicine and the availability of in person appointments. The patient expressed understanding and agreed to proceed.     I discussed the assessment and treatment plan with the patient. The patient was provided an opportunity to ask questions and all were answered. The patient agreed with the plan and demonstrated an understanding of the instructions.   The patient was advised to call back or seek an in-person evaluation if the symptoms worsen or if the condition fails to improve as anticipated.  I provided 20 minutes of non-face-to-face time during this encounter.   Staci LOISE Kerns, NP   BH MD/PA/NP OP Progress Note  11/02/2024 1:19 PM NAIJA TROOST  MRN:  [id number]  Chief Complaint: Verneita stated  I have been in a good space overall.  HPI: Maanvi Lecompte 69 year old female presents for medication management follow-up appointment.  Currently she is prescribed doxepin ,  Cymbalta  and Lexapro which she reports she has been taking and tolerating well.  Reports she feels like this is a good medication combination.   Reports she is continue to be followed by therapy services states learning and utilizing grounding techniques.  Reports the recent passing of her overall which she reports was under hospice care.  Reports her past and help me to be, patient with my mother who is 41 years old.  Reported she has been more attentive towards her mother with a lot more compassion.  She reports upcoming family trip to [place]  minus her younger sister.   I did be good to be around everyone.  Anticipates hip surgery this upcoming April to which she is looking forward to.  States she  is currently utilizing a scooter and a rollator for ambulation assistance.  No concerns related to depression or depressive symptoms.  Reports a good appetite.  States she is resting well throughout the night.  Medication refills was made available.  Support and encouragement reassurance was provided.  Visit Diagnosis:    ICD-10-CM   1. Severe episode of recurrent major depressive disorder, without psychotic features (HCC)  F33.2     2. GAD (generalized anxiety disorder)  F41.1       Past Psychiatric History: H/O: Major depressive disorder, generalized anxiety disorder, posttraumatic stress disorder, sleep disturbance.  Previous inpatient admissions.  Past Medical History:  Past Medical History:  Diagnosis Date   AKI (acute kidney injury) 10/03/2023   Arthritis    Closed fracture of right distal radius    Complication of anesthesia    Degeneration of lumbar intervertebral disc 01/26/2020   Depression with anxiety 10/03/2023   GAD (generalized anxiety disorder) 11/15/2021   Gastroparesis    GERD (gastroesophageal reflux disease)    History of CVA (cerebrovascular accident) 10/03/2023   History of hiatal hernia    Hyperlipidemia LDL goal <100 03/26/2022   The ASCVD Risk score (Arnett DK, et al., 2019) failed to calculate for the following reasons:    The valid HDL cholesterol range is 20 to 100 mg/dL     Hypertension    Hypotension 10/03/2023   Irritable bowel disease    Lupus    Major depression    Mitral valve prolapse    Morton's neuroma of  left foot 08/30/2023   Near syncope 10/03/2023   Need for shingles vaccine 11/15/2021   Need for vaccination 11/15/2021   Nonrheumatic mitral valve regurgitation 05/10/2022   Osteopenia 12/14/2015   Peptic ulcer disease 12/14/2015   PONV (postoperative nausea and vomiting)    Recurrent major depressive disorder, in partial remission 11/15/2021   Sinus bradycardia 10/03/2023   Stage 3a chronic kidney disease (HCC) 11/15/2021    Estimated Creatinine Clearance: 49.7 mL/min (by C-G formula based on SCr of 1.01 mg/dL).      Estimated Creatinine Clearance: 45.9 mL/min (A) (by C-G formula based on SCr of 1.07 mg/dL (H)).      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   Stroke [facility])    Trigeminal neuralgia    Visit for screening mammogram 11/15/2021    Past Surgical History:  Procedure Laterality Date   BUBBLE STUDY  07/05/2022   Procedure: BUBBLE STUDY;  Surgeon: Loni Soyla LABOR, MD;  Location: [facility] ENDOSCOPY;  Service: Cardiology;;   CARPAL TUNNEL RELEASE Left 05/15/2023   CHOLECYSTECTOMY     COLONOSCOPY     DECOMPRESSIVE LUMBAR LAMINECTOMY LEVEL 1 Right 09/03/2024   Procedure: Hemilaminectomy, Foraminotomy Lumbar four-five-right;  Surgeon: Duwayne Purchase, MD;  Location: [facility] OR;  Service: Orthopedics;  Laterality: Right;   LOOP RECORDER IMPLANT  2021   ORIF WRIST FRACTURE Right 09/22/2020   Procedure: OPEN REDUCTION INTERNAL FIXATION (ORIF) WRIST FRACTURE;  Surgeon: Carolee Lynwood JINNY DOUGLAS, MD;  Location: [facility];  Service: Orthopedics;  Laterality: Right;    TEE WITHOUT CARDIOVERSION N/A 07/05/2022   Procedure: TRANSESOPHAGEAL ECHOCARDIOGRAM (TEE);  Surgeon: Loni Soyla LABOR, MD;  Location: [facility] ENDOSCOPY;  Service: Cardiology;  Laterality: N/A;   UPPER GASTROINTESTINAL ENDOSCOPY      Family Psychiatric History:   Family History:  Family History  Problem Relation Age of Onset   Heart block Mother        Pacemaker   Hypertension Mother    Depression Mother    Gout Mother    Macular degeneration Mother    Aneurysm Mother    Alcoholism Father    Heart failure Maternal Grandmother    Hypertension Maternal Grandmother    Congestive Heart Failure Maternal Grandmother    Diabetes Maternal Grandmother    Congestive Heart Failure Maternal Grandfather    Colon cancer Maternal Grandfather    Stroke Paternal Grandfather    Hypertension Paternal Grandfather    Esophageal cancer Neg Hx    Stomach  cancer Neg Hx    Rectal cancer Neg Hx     Social History:  Social History   Socioeconomic History   Marital status: Widowed    Spouse name: Not on file   Number of children: 0   Years of education: Not on file   Highest education level: Bachelor's degree (e.g., BA, AB, BS)  Occupational History    Comment: RN ortho/neuro at [employer]  Tobacco Use   Smoking status: Never    Passive exposure: Past   Smokeless tobacco: Never  Vaping Use   Vaping status: Never Used  Substance and Sexual Activity   Alcohol use: Yes    Comment: social   Drug use: Never   Sexual activity: Not Currently    Birth control/protection: Post-menopausal  Other Topics Concern   Not on file  Social History Narrative   06/13/22 living with her mother   2 Mountain Dew per Week   Social Drivers of Health   Tobacco Use: Low Risk (09/03/2024)   Patient  History    Smoking Tobacco Use: Never    Smokeless Tobacco Use: Never    Passive Exposure: Past  Financial Resource Strain: Low Risk (05/05/2024)   Overall Financial Resource Strain (CARDIA)    Difficulty of Paying Living Expenses: Not very hard  Food Insecurity: No Food Insecurity (05/05/2024)   Epic    Worried About Programme Researcher, Broadcasting/film/video in the Last Year: Never true    Ran Out of Food in the Last Year: Never true  Transportation Needs: No Transportation Needs (05/05/2024)   Epic    Lack of Transportation (Medical): No    Lack of Transportation (Non-Medical): No  Physical Activity: Sufficiently Active (05/05/2024)   Exercise Vital Sign    Days of Exercise per Week: 7 days    Minutes of Exercise per Session: 60 min  Stress: Stress Concern Present (05/05/2024)   [employer] of Occupational Health - Occupational Stress Questionnaire    Feeling of Stress: To some extent  Social Connections: Socially Isolated (05/05/2024)   Social Connection and Isolation Panel    Frequency of Communication with Friends and Family: More than three times a week    Frequency  of Social Gatherings with Friends and Family: Twice a week    Attends Religious Services: Never    Database Administrator or Organizations: No    Attends Banker Meetings: Never    Marital Status: Widowed  Depression (PHQ2-9): Low Risk (05/19/2024)   Depression (PHQ2-9)    PHQ-2 Score: 0  Recent Concern: Depression (PHQ2-9) - High Risk (05/05/2024)   Depression (PHQ2-9)    PHQ-2 Score: 16  Alcohol Screen: Low Risk (05/05/2024)   Alcohol Screen    Last Alcohol Screening Score (AUDIT): 1  Housing: Low Risk (05/05/2024)   Epic    Unable to Pay for Housing in the Last Year: No    Number of Times Moved in the Last Year: 0    Homeless in the Last Year: No  Utilities: Not At Risk (05/05/2024)   Epic    Threatened with loss of utilities: No  Health Literacy: Adequate Health Literacy (05/05/2024)   B1300 Health Literacy    Frequency of need for help with medical instructions: Never    Allergies: Allergies[1]  Metabolic Disorder Labs: Lab Results  Component Value Date   HGBA1C 5.7 (H) 05/08/2022   MPG 116.89 05/08/2022   No results found for: PROLACTIN Lab Results  Component Value Date   CHOL 172 03/17/2024   TRIG 59 03/17/2024   HDL 96 03/17/2024   CHOLHDL 1.8 03/17/2024   VLDL 24 05/08/2022   LDLCALC 64 03/17/2024   LDLCALC 97 02/18/2024   Lab Results  Component Value Date   TSH 1.886 10/02/2023   TSH 2.11 01/21/2023    Therapeutic Level Labs: No results found for: LITHIUM No results found for: VALPROATE Lab Results  Component Value Date   CBMZ <2.0 (L) 07/29/2023    Current Medications: Current Outpatient Medications  Medication Sig Dispense Refill   acetaminophen  (TYLENOL ) 650 MG CR tablet Take 650 mg by mouth every 8 (eight) hours as needed for pain.     acyclovir  (ZOVIRAX ) 400 MG tablet TAKE 1 TABLET(400 MG) BY MOUTH DAILY 90 tablet 1   amLODipine  (NORVASC ) 2.5 MG tablet Take 2.5 mg by mouth at bedtime as needed (diastolic pressure above 90).      amLODipine  (NORVASC ) 5 MG tablet Take 5 mg by mouth daily.     Cholecalciferol 125 MCG (5000 UT) capsule  Take 5,000 Units by mouth daily.     clopidogrel  (PLAVIX ) 75 MG tablet Take 1 tablet (75 mg total) by mouth daily. 30 tablet 0   denosumab  (PROLIA ) 60 MG/ML SOSY injection Inject 60 mg into the skin every 6 (six) months.     docusate sodium  (COLACE) 100 MG capsule Take 1 capsule (100 mg total) by mouth 2 (two) times daily. 60 capsule 2   doxepin  (SINEQUAN ) 75 MG capsule Take 1 capsule (75 mg total) by mouth at bedtime. 90 capsule 1   DULoxetine  (CYMBALTA ) 30 MG capsule Take 1 capsule (30 mg total) by mouth daily. 90 capsule 1   DULoxetine  (CYMBALTA ) 60 MG capsule Take 1 capsule (60 mg total) by mouth at bedtime. 90 capsule 1   gabapentin  (NEURONTIN ) 300 MG capsule Take 600 mg by mouth 3 (three) times daily.     magic mouthwash (nystatin , hydrocortisone, diphenhydrAMINE) suspension Swish and spit 5 mLs 3 (three) times daily. 240 mL 0   oxyCODONE  (OXY IR/ROXICODONE ) 5 MG immediate release tablet Take 1 tablet (5 mg total) by mouth every 4 (four) hours as needed for severe pain (pain score 7-10). 40 tablet 0   pantoprazole  (PROTONIX ) 40 MG tablet Take 1 tablet (40 mg total) by mouth 2 (two) times daily before a meal. 180 tablet 3   polyethylene glycol (MIRALAX  / GLYCOLAX ) 17 g packet Take 17 g by mouth daily. 14 each 0   sodium zirconium cyclosilicate (LOKELMA) 10 g PACK packet Take 10 g by mouth 3 (three) times a week.     telmisartan  (MICARDIS ) 40 MG tablet Take 1 tablet (40 mg total) by mouth daily. 30 tablet 3   telmisartan  (MICARDIS ) 40 MG tablet Take 1 tablet (40 mg total) by mouth daily. 90 tablet 3   tizanidine  (ZANAFLEX ) 2 MG capsule Take 4 mg by mouth at bedtime.     traMADol  (ULTRAM ) 50 MG tablet Take 50 mg by mouth every 4 (four) hours as needed.     Current Facility-Administered Medications  Medication Dose Route Frequency Provider Last Rate Last Admin   denosumab  (PROLIA )  injection 60 mg  60 mg Subcutaneous Q6 months Joshua Debby CROME, MD         Musculoskeletal: Virtual assessment  Psychiatric Specialty Exam: Review of Systems  There were no vitals taken for this visit.There is no height or weight on file to calculate BMI.  General Appearance: Casual  Eye Contact:  Good  Speech:  Clear and Coherent  Volume:  Normal  Mood:  Euthymic  Affect:  Congruent  Thought Process:  Coherent  Orientation:  Full (Time, Place, and Person)  Thought Content: Logical   Suicidal Thoughts:  No  Homicidal Thoughts:  No  Memory:  Immediate;   Good Recent;   Good  Judgement:  Good  Insight:  Good  Psychomotor Activity:  Normal  Concentration:  Concentration: Good  Recall:  Good  Fund of Knowledge: Good  Language: Good  Akathisia:  No  Handed:  Right  AIMS (if indicated): not done  Assets:  Communication Skills Desire for Improvement  ADL's:  Intact  Cognition: WNL  Sleep:  Good   Screenings: AUDIT    Flowsheet Row Clinical Support from 05/05/2024 in [facility] [place] Admission (Discharged) from 04/21/2024 in [facility]AL [facility]IC[facility] Office Visit from 04/02/2024 in [facility]  Alcohol Use Disorder Identification Test Final Score (AUDIT) 1 3 3     GAD-7  Advertising Copywriter from 11/26/2023 in [facility] Outpatient Behavior[facility] at [facility] Visit from 11/15/2021 in [facility]  Total GAD-7 Score 17 2   PHQ2-9    Flowsheet Row Patient Outreach Telephone from 05/19/2024 in [place] [facility] DEPARTMENT Clinical Support from 05/05/2024 in [facility] [place] Telephone from 04/29/2024 in  POPULATION HEALTH DEPARTMENT Counselor from 11/26/2023 in [facility] Outpatient Behavior[facility] Health at [facility] Visit from 10/29/2023 in [facility] at [facility]  PHQ-2 Total Score 0 4  5 5  0  PHQ-9 Total Score -- 16 17 19  --   Flowsheet Row Admission (Discharged) from 09/03/2024 in [facility] Clinical Support from 05/05/2024 in [facility] Admission (Discharged) from 04/21/2024 in [facility] BEHAVIORAL MEDICINE  C-SSRS RISK CATEGORY No Risk High Risk No Risk     Assessment and Plan: Adalind Weitz 69 year old female presents for medication management follow-up appointment.  [place] is a diagnoses related to depression and anxiety.  Reports overall her mood is stabilized she has been under a lot of better spirits over the past few months.  Reports taking medications as directed.  No concerns related to suicidal or homicidal ideations.  Requesting medication refill with doxepin .  States she continues to take Cymbalta  as directed.  Plans to follow-up with biweekly therapy services, Joane Ricker.  Reports a good appetite.  States she is resting well throughout the night.  Patient to follow-up 4 months for medication adherence/tolerability.  90-day supply made available x 1 refill.  Support encouragement reassurance was provided.  Collaboration of Care: Collaboration of Care: Referral or follow-up with counselor/therapist AEB Joane Ricker  Patient/Guardian was advised Release of Information must be obtained prior to any record release in order to collaborate their care with an outside provider. Patient/Guardian was advised if they have not already done so to contact the registration department to sign all necessary forms in order for us  to release information regarding their care.   Consent: Patient/Guardian gives verbal consent for treatment and assignment of benefits for services provided during this visit. Patient/Guardian expressed understanding and agreed to proceed.    Staci LOISE Kerns, NP 11/02/2024, 1:19 PM     [1]  Allergies Allergen Reactions   Plaquenil  [Hydroxychloroquine ] Other (See Comments)     Retinal damage   Penicillins Rash   Sulfa Antibiotics Nausea And Vomiting   Sulfamethoxazole-Trimethoprim Nausea And Vomiting

## 2024-11-03 ENCOUNTER — Ambulatory Visit: Attending: Rheumatology | Admitting: Rheumatology

## 2024-11-03 ENCOUNTER — Encounter: Payer: Self-pay | Admitting: Rheumatology

## 2024-11-03 VITALS — BP 165/81 | HR 79 | Temp 97.8°F | Resp 14 | Ht 62.0 in | Wt 152.4 lb

## 2024-11-03 DIAGNOSIS — I63311 Cerebral infarction due to thrombosis of right middle cerebral artery: Secondary | ICD-10-CM | POA: Insufficient documentation

## 2024-11-03 DIAGNOSIS — M3219 Other organ or system involvement in systemic lupus erythematosus: Secondary | ICD-10-CM | POA: Insufficient documentation

## 2024-11-03 DIAGNOSIS — M51369 Other intervertebral disc degeneration, lumbar region without mention of lumbar back pain or lower extremity pain: Secondary | ICD-10-CM | POA: Diagnosis not present

## 2024-11-03 DIAGNOSIS — M810 Age-related osteoporosis without current pathological fracture: Secondary | ICD-10-CM | POA: Insufficient documentation

## 2024-11-03 DIAGNOSIS — K279 Peptic ulcer, site unspecified, unspecified as acute or chronic, without hemorrhage or perforation: Secondary | ICD-10-CM | POA: Diagnosis present

## 2024-11-03 DIAGNOSIS — K3184 Gastroparesis: Secondary | ICD-10-CM | POA: Diagnosis present

## 2024-11-03 DIAGNOSIS — M79672 Pain in left foot: Secondary | ICD-10-CM | POA: Insufficient documentation

## 2024-11-03 DIAGNOSIS — M19041 Primary osteoarthritis, right hand: Secondary | ICD-10-CM | POA: Diagnosis not present

## 2024-11-03 DIAGNOSIS — D638 Anemia in other chronic diseases classified elsewhere: Secondary | ICD-10-CM | POA: Diagnosis present

## 2024-11-03 DIAGNOSIS — G8929 Other chronic pain: Secondary | ICD-10-CM | POA: Diagnosis present

## 2024-11-03 DIAGNOSIS — M1611 Unilateral primary osteoarthritis, right hip: Secondary | ICD-10-CM | POA: Insufficient documentation

## 2024-11-03 DIAGNOSIS — M19042 Primary osteoarthritis, left hand: Secondary | ICD-10-CM | POA: Insufficient documentation

## 2024-11-03 DIAGNOSIS — I340 Nonrheumatic mitral (valve) insufficiency: Secondary | ICD-10-CM | POA: Diagnosis present

## 2024-11-03 DIAGNOSIS — E785 Hyperlipidemia, unspecified: Secondary | ICD-10-CM | POA: Insufficient documentation

## 2024-11-03 DIAGNOSIS — N1831 Chronic kidney disease, stage 3a: Secondary | ICD-10-CM | POA: Diagnosis present

## 2024-11-03 DIAGNOSIS — R760 Raised antibody titer: Secondary | ICD-10-CM | POA: Diagnosis not present

## 2024-11-03 DIAGNOSIS — Z8719 Personal history of other diseases of the digestive system: Secondary | ICD-10-CM | POA: Insufficient documentation

## 2024-11-03 DIAGNOSIS — F3341 Major depressive disorder, recurrent, in partial remission: Secondary | ICD-10-CM | POA: Diagnosis present

## 2024-11-03 DIAGNOSIS — T372X5A Adverse effect of antimalarials and drugs acting on other blood protozoa, initial encounter: Secondary | ICD-10-CM | POA: Insufficient documentation

## 2024-11-03 DIAGNOSIS — I10 Essential (primary) hypertension: Secondary | ICD-10-CM | POA: Diagnosis not present

## 2024-11-03 DIAGNOSIS — M25561 Pain in right knee: Secondary | ICD-10-CM | POA: Diagnosis not present

## 2024-11-03 DIAGNOSIS — F411 Generalized anxiety disorder: Secondary | ICD-10-CM | POA: Insufficient documentation

## 2024-11-03 DIAGNOSIS — M25562 Pain in left knee: Secondary | ICD-10-CM | POA: Insufficient documentation

## 2024-11-03 DIAGNOSIS — H35389 Toxic maculopathy, unspecified eye: Secondary | ICD-10-CM | POA: Diagnosis not present

## 2024-11-03 DIAGNOSIS — M7061 Trochanteric bursitis, right hip: Secondary | ICD-10-CM | POA: Diagnosis not present

## 2024-11-03 DIAGNOSIS — G500 Trigeminal neuralgia: Secondary | ICD-10-CM | POA: Insufficient documentation

## 2024-11-03 DIAGNOSIS — M79671 Pain in right foot: Secondary | ICD-10-CM | POA: Diagnosis not present

## 2024-11-03 DIAGNOSIS — Z8739 Personal history of other diseases of the musculoskeletal system and connective tissue: Secondary | ICD-10-CM

## 2024-11-16 ENCOUNTER — Encounter (HOSPITAL_COMMUNITY): Payer: Self-pay

## 2024-11-16 ENCOUNTER — Emergency Department (HOSPITAL_COMMUNITY)

## 2024-11-16 ENCOUNTER — Observation Stay (HOSPITAL_COMMUNITY)
Admission: EM | Admit: 2024-11-16 | Disposition: A | Source: Home / Self Care | Attending: Internal Medicine | Admitting: Internal Medicine

## 2024-11-16 ENCOUNTER — Other Ambulatory Visit: Payer: Self-pay

## 2024-11-16 DIAGNOSIS — M5416 Radiculopathy, lumbar region: Secondary | ICD-10-CM | POA: Diagnosis present

## 2024-11-16 DIAGNOSIS — M5459 Other low back pain: Principal | ICD-10-CM

## 2024-11-16 DIAGNOSIS — Z840 Family history of diseases of the skin and subcutaneous tissue: Secondary | ICD-10-CM

## 2024-11-16 DIAGNOSIS — Z8673 Personal history of transient ischemic attack (TIA), and cerebral infarction without residual deficits: Secondary | ICD-10-CM

## 2024-11-16 DIAGNOSIS — M810 Age-related osteoporosis without current pathological fracture: Secondary | ICD-10-CM

## 2024-11-16 DIAGNOSIS — M1611 Unilateral primary osteoarthritis, right hip: Secondary | ICD-10-CM | POA: Diagnosis not present

## 2024-11-16 DIAGNOSIS — Z9889 Other specified postprocedural states: Secondary | ICD-10-CM

## 2024-11-16 DIAGNOSIS — Z96641 Presence of right artificial hip joint: Secondary | ICD-10-CM

## 2024-11-16 DIAGNOSIS — Z8739 Personal history of other diseases of the musculoskeletal system and connective tissue: Secondary | ICD-10-CM

## 2024-11-16 DIAGNOSIS — N1831 Chronic kidney disease, stage 3a: Secondary | ICD-10-CM | POA: Diagnosis not present

## 2024-11-16 DIAGNOSIS — M5126 Other intervertebral disc displacement, lumbar region: Secondary | ICD-10-CM | POA: Diagnosis not present

## 2024-11-16 DIAGNOSIS — Z8679 Personal history of other diseases of the circulatory system: Secondary | ICD-10-CM

## 2024-11-16 DIAGNOSIS — Z8659 Personal history of other mental and behavioral disorders: Secondary | ICD-10-CM

## 2024-11-16 DIAGNOSIS — I10 Essential (primary) hypertension: Secondary | ICD-10-CM | POA: Diagnosis present

## 2024-11-16 DIAGNOSIS — Z981 Arthrodesis status: Secondary | ICD-10-CM

## 2024-11-16 DIAGNOSIS — K589 Irritable bowel syndrome without diarrhea: Secondary | ICD-10-CM | POA: Diagnosis present

## 2024-11-16 DIAGNOSIS — K3184 Gastroparesis: Secondary | ICD-10-CM | POA: Diagnosis present

## 2024-11-16 DIAGNOSIS — M48061 Spinal stenosis, lumbar region without neurogenic claudication: Secondary | ICD-10-CM | POA: Diagnosis present

## 2024-11-16 DIAGNOSIS — M329 Systemic lupus erythematosus, unspecified: Secondary | ICD-10-CM | POA: Insufficient documentation

## 2024-11-16 LAB — CBC
HCT: 37 % (ref 36.0–46.0)
Hemoglobin: 11.9 g/dL — ABNORMAL LOW (ref 12.0–15.0)
MCH: 29.7 pg (ref 26.0–34.0)
MCHC: 32.2 g/dL (ref 30.0–36.0)
MCV: 92.3 fL (ref 80.0–100.0)
Platelets: 477 10*3/uL — ABNORMAL HIGH (ref 150–400)
RBC: 4.01 MIL/uL (ref 3.87–5.11)
RDW: 13.4 % (ref 11.5–15.5)
WBC: 7.8 10*3/uL (ref 4.0–10.5)
nRBC: 0 % (ref 0.0–0.2)

## 2024-11-16 LAB — BASIC METABOLIC PANEL WITH GFR
Anion gap: 11 (ref 5–15)
BUN: 25 mg/dL — ABNORMAL HIGH (ref 8–23)
CO2: 20 mmol/L — ABNORMAL LOW (ref 22–32)
Calcium: 8.9 mg/dL (ref 8.9–10.3)
Chloride: 109 mmol/L (ref 98–111)
Creatinine, Ser: 1.27 mg/dL — ABNORMAL HIGH (ref 0.44–1.00)
GFR, Estimated: 46 mL/min — ABNORMAL LOW
Glucose, Bld: 182 mg/dL — ABNORMAL HIGH (ref 70–99)
Potassium: 4.8 mmol/L (ref 3.5–5.1)
Sodium: 140 mmol/L (ref 135–145)

## 2024-11-16 LAB — HIV ANTIBODY (ROUTINE TESTING W REFLEX): HIV Screen 4th Generation wRfx: NONREACTIVE

## 2024-11-16 MED ORDER — ENOXAPARIN SODIUM 40 MG/0.4ML IJ SOSY: 40.0000 mg | PREFILLED_SYRINGE | INTRAMUSCULAR | Status: DC

## 2024-11-16 MED ORDER — METHOCARBAMOL 1000 MG/10ML IJ SOLN
500.0000 mg | Freq: Four times a day (QID) | INTRAMUSCULAR | Status: DC | PRN
  Administered 2024-11-17 (×2): 500 mg via INTRAVENOUS
  Filled 2024-11-16 (×2): qty 10

## 2024-11-16 MED ORDER — OXYCODONE HCL 5 MG PO TABS
5.0000 mg | ORAL_TABLET | Freq: Four times a day (QID) | ORAL | Status: AC | PRN
  Administered 2024-11-17 (×2): 5 mg via ORAL
  Filled 2024-11-16 (×2): qty 1

## 2024-11-16 MED ORDER — KETOROLAC TROMETHAMINE 15 MG/ML IJ SOLN
15.0000 mg | Freq: Once | INTRAMUSCULAR | Status: AC
  Administered 2024-11-16: 15 mg via INTRAVENOUS
  Filled 2024-11-16: qty 1

## 2024-11-16 MED ORDER — ACETAMINOPHEN 325 MG PO TABS
650.0000 mg | ORAL_TABLET | Freq: Four times a day (QID) | ORAL | Status: AC | PRN
  Administered 2024-11-18 – 2024-11-20 (×5): 650 mg via ORAL
  Filled 2024-11-16 (×5): qty 2

## 2024-11-16 MED ORDER — AMLODIPINE BESYLATE 5 MG PO TABS
5.0000 mg | ORAL_TABLET | Freq: Every day | ORAL | Status: AC
  Administered 2024-11-17 – 2024-11-19 (×3): 5 mg via ORAL
  Filled 2024-11-16 (×3): qty 1

## 2024-11-16 MED ORDER — SENNOSIDES-DOCUSATE SODIUM 8.6-50 MG PO TABS
1.0000 | ORAL_TABLET | Freq: Two times a day (BID) | ORAL | Status: AC
  Administered 2024-11-16 – 2024-11-20 (×8): 1 via ORAL
  Filled 2024-11-16 (×7): qty 1

## 2024-11-16 MED ORDER — CLOPIDOGREL BISULFATE 75 MG PO TABS
75.0000 mg | ORAL_TABLET | Freq: Every day | ORAL | Status: DC
  Administered 2024-11-17: 75 mg via ORAL
  Filled 2024-11-16: qty 1

## 2024-11-16 MED ORDER — METHYLPREDNISOLONE SODIUM SUCC 125 MG IJ SOLR
60.0000 mg | Freq: Once | INTRAMUSCULAR | Status: AC
  Administered 2024-11-16: 60 mg via INTRAVENOUS
  Filled 2024-11-16: qty 2

## 2024-11-16 MED ORDER — PREDNISONE 20 MG PO TABS: 20.0000 mg | ORAL_TABLET | Freq: Every day | ORAL | Status: DC

## 2024-11-16 MED ORDER — SODIUM CHLORIDE 0.9% FLUSH
3.0000 mL | Freq: Two times a day (BID) | INTRAVENOUS | Status: AC
  Administered 2024-11-16 – 2024-11-20 (×7): 3 mL via INTRAVENOUS

## 2024-11-16 MED ORDER — HYDROMORPHONE HCL 1 MG/ML IJ SOLN
0.5000 mg | INTRAMUSCULAR | Status: AC | PRN
  Administered 2024-11-17 – 2024-11-18 (×3): 1 mg via INTRAVENOUS
  Administered 2024-11-18: 0.5 mg via INTRAVENOUS
  Administered 2024-11-19 – 2024-11-20 (×5): 1 mg via INTRAVENOUS
  Filled 2024-11-16 (×9): qty 1

## 2024-11-16 MED ORDER — SODIUM CHLORIDE 0.9% FLUSH
3.0000 mL | INTRAVENOUS | Status: AC | PRN
  Administered 2024-11-16: 3 mL via INTRAVENOUS

## 2024-11-16 MED ORDER — PREDNISONE 5 MG PO TABS: 5.0000 mg | ORAL_TABLET | Freq: Every day | ORAL | Status: DC

## 2024-11-16 MED ORDER — FENTANYL CITRATE (PF) 50 MCG/ML IJ SOSY: 50.0000 ug | PREFILLED_SYRINGE | Freq: Once | INTRAMUSCULAR | Status: DC

## 2024-11-16 MED ORDER — DULOXETINE HCL 30 MG PO CPEP
30.0000 mg | ORAL_CAPSULE | Freq: Every day | ORAL | Status: DC
  Administered 2024-11-17 – 2024-11-18 (×2): 30 mg via ORAL
  Filled 2024-11-16 (×2): qty 1

## 2024-11-16 MED ORDER — ONDANSETRON HCL 4 MG/2ML IJ SOLN: 4.0000 mg | Freq: Four times a day (QID) | INTRAMUSCULAR | Status: AC | PRN

## 2024-11-16 MED ORDER — METHYLPREDNISOLONE SODIUM SUCC 125 MG IJ SOLR: 125.0000 mg | Freq: Once | INTRAMUSCULAR | Status: DC

## 2024-11-16 MED ORDER — PREDNISONE 20 MG PO TABS
30.0000 mg | ORAL_TABLET | Freq: Every day | ORAL | Status: AC
  Administered 2024-11-19: 30 mg via ORAL
  Filled 2024-11-16 (×2): qty 1

## 2024-11-16 MED ORDER — ACETAMINOPHEN 325 MG PO TABS
650.0000 mg | ORAL_TABLET | Freq: Once | ORAL | Status: AC
  Administered 2024-11-16: 650 mg via ORAL
  Filled 2024-11-16: qty 2

## 2024-11-16 MED ORDER — SODIUM CHLORIDE 0.9% FLUSH
3.0000 mL | Freq: Two times a day (BID) | INTRAVENOUS | Status: AC
  Administered 2024-11-16 – 2024-11-20 (×9): 3 mL via INTRAVENOUS

## 2024-11-16 MED ORDER — PREDNISONE 20 MG PO TABS
50.0000 mg | ORAL_TABLET | Freq: Every day | ORAL | Status: AC
  Administered 2024-11-17: 50 mg via ORAL
  Filled 2024-11-16: qty 1

## 2024-11-16 MED ORDER — FENTANYL CITRATE (PF) 50 MCG/ML IJ SOSY
50.0000 ug | PREFILLED_SYRINGE | Freq: Once | INTRAMUSCULAR | Status: AC
  Administered 2024-11-16: 50 ug via INTRAVENOUS
  Filled 2024-11-16: qty 1

## 2024-11-16 MED ORDER — IRBESARTAN 75 MG PO TABS
75.0000 mg | ORAL_TABLET | Freq: Every day | ORAL | Status: DC
  Administered 2024-11-16 – 2024-11-19 (×4): 75 mg via ORAL
  Filled 2024-11-16 (×5): qty 1

## 2024-11-16 MED ORDER — PANTOPRAZOLE SODIUM 40 MG PO TBEC
40.0000 mg | DELAYED_RELEASE_TABLET | Freq: Two times a day (BID) | ORAL | Status: AC
  Administered 2024-11-17 – 2024-11-19 (×6): 40 mg via ORAL
  Filled 2024-11-16 (×6): qty 1

## 2024-11-16 MED ORDER — SODIUM BICARBONATE 650 MG PO TABS
650.0000 mg | ORAL_TABLET | Freq: Two times a day (BID) | ORAL | Status: AC
  Administered 2024-11-16 – 2024-11-17 (×2): 650 mg via ORAL
  Filled 2024-11-16 (×2): qty 1

## 2024-11-16 MED ORDER — MORPHINE SULFATE (PF) 4 MG/ML IV SOLN
4.0000 mg | Freq: Once | INTRAVENOUS | Status: AC
  Administered 2024-11-16: 4 mg via INTRAVENOUS
  Filled 2024-11-16: qty 1

## 2024-11-16 MED ORDER — DULOXETINE HCL 60 MG PO CPEP
60.0000 mg | ORAL_CAPSULE | Freq: Every day | ORAL | Status: DC
  Administered 2024-11-16 – 2024-11-17 (×2): 60 mg via ORAL
  Filled 2024-11-16: qty 1
  Filled 2024-11-16: qty 2

## 2024-11-16 MED ORDER — GABAPENTIN 300 MG PO CAPS
600.0000 mg | ORAL_CAPSULE | Freq: Two times a day (BID) | ORAL | Status: AC
  Administered 2024-11-16 – 2024-11-20 (×8): 600 mg via ORAL
  Administered 2024-11-20: 300 mg via ORAL
  Filled 2024-11-16 (×4): qty 2
  Filled 2024-11-16: qty 6
  Filled 2024-11-16 (×4): qty 2

## 2024-11-16 MED ORDER — ONDANSETRON HCL 4 MG PO TABS
4.0000 mg | ORAL_TABLET | Freq: Four times a day (QID) | ORAL | Status: AC | PRN
  Administered 2024-11-17: 4 mg via ORAL
  Filled 2024-11-16: qty 1

## 2024-11-16 MED ORDER — SODIUM CHLORIDE 0.9 % IV SOLN: 250.0000 mL | INTRAVENOUS | Status: AC | PRN

## 2024-11-16 MED ORDER — PREDNISONE 20 MG PO TABS
40.0000 mg | ORAL_TABLET | Freq: Every day | ORAL | Status: AC
  Administered 2024-11-18: 40 mg via ORAL
  Filled 2024-11-16: qty 2

## 2024-11-16 MED ORDER — ACETAMINOPHEN 650 MG RE SUPP: 650.0000 mg | Freq: Four times a day (QID) | RECTAL | Status: AC | PRN

## 2024-11-16 MED ORDER — GADOBUTROL 1 MMOL/ML IV SOLN
7.0000 mL | Freq: Once | INTRAVENOUS | Status: AC | PRN
  Administered 2024-11-16: 7 mL via INTRAVENOUS

## 2024-11-16 MED ORDER — DOXEPIN HCL 25 MG PO CAPS
75.0000 mg | ORAL_CAPSULE | Freq: Every day | ORAL | Status: AC
  Administered 2024-11-16 – 2024-11-20 (×5): 75 mg via ORAL
  Filled 2024-11-16: qty 3
  Filled 2024-11-16: qty 1
  Filled 2024-11-16 (×4): qty 3

## 2024-11-16 MED ORDER — PREDNISONE 20 MG PO TABS: 10.0000 mg | ORAL_TABLET | Freq: Every day | ORAL | Status: DC

## 2024-11-16 MED ORDER — METHOCARBAMOL 500 MG PO TABS
500.0000 mg | ORAL_TABLET | Freq: Once | ORAL | Status: AC
  Administered 2024-11-16: 500 mg via ORAL
  Filled 2024-11-16: qty 1

## 2024-11-16 NOTE — ED Provider Notes (Addendum)
 " [place] EMERGENCY DEPARTMENT AT [facility] Provider Note   CSN: [id number] Arrival date & time: 11/16/24  1430     Patient presents with: Back Pain   Debbie Hatfield is a 69 y.o. female.   Patient is a 69 year old female with significant past medical history for L4-L5 right-sided spinal stenosis with right hemilaminectomy with foraminotomies of L4 and L5, partial medial hemifacetectomy L4-L5 right, and lysis of extensive epidural venous plexus L4-L5 right on 09/03/2024 with Dr. Duwayne presenting for acute right sided low back pain.  Patient states she was shoveling snow 2 days ago that resulted in  excruciating right sided low back pain.  Patient midst of radiation into the right buttocks and down the right lateral thigh.  Pain is described as spasms.  She denies any sensation or motor loss.  Denies any extremity discoloration.  Has any bowel or urinary incontinence.  Denies any fevers.  The history is provided by the patient. No language interpreter was used.  Back Pain      Prior to Admission medications  Medication Sig Start Date End Date Taking? Authorizing Provider  acetaminophen  (TYLENOL ) 650 MG CR tablet Take 650 mg by mouth every 8 (eight) hours as needed for pain.    [provider]  acyclovir  (ZOVIRAX ) 400 MG tablet TAKE 1 TABLET(400 MG) BY MOUTH DAILY 07/21/24   Joshua Debby CROME, MD  amLODipine  (NORVASC ) 2.5 MG tablet Take 2.5 mg by mouth at bedtime as needed (diastolic pressure above 90). Patient not taking: Reported on 11/03/2024    [provider]  amLODipine  (NORVASC ) 5 MG tablet Take 5 mg by mouth daily.    [provider]  Cholecalciferol 125 MCG (5000 UT) capsule Take 5,000 Units by mouth daily. 02/12/24   [provider]  clopidogrel  (PLAVIX ) 75 MG tablet Take 1 tablet (75 mg total) by mouth daily. 10/28/24   Sater, Charlie LABOR, MD  denosumab  (PROLIA ) 60 MG/ML SOSY injection Inject 60 mg into the skin every 6 (six) months.     [provider]  docusate sodium  (COLACE) 100 MG capsule Take 1 capsule (100 mg total) by mouth 2 (two) times daily. Patient not taking: Reported on 11/03/2024 09/03/24 09/03/25  Bissell, Jaclyn M, PA-C  doxepin  (SINEQUAN ) 75 MG capsule Take 1 capsule (75 mg total) by mouth at bedtime. 11/02/24 11/02/25  Ezzard Staci SAILOR, NP  DULoxetine  (CYMBALTA ) 30 MG capsule Take 1 capsule (30 mg total) by mouth daily. 11/02/24   Ezzard Staci SAILOR, NP  DULoxetine  (CYMBALTA ) 60 MG capsule Take 1 capsule (60 mg total) by mouth at bedtime. 11/02/24   Ezzard Staci SAILOR, NP  gabapentin  (NEURONTIN ) 300 MG capsule Take 600 mg by mouth 3 (three) times daily.    [provider]  magic mouthwash (nystatin , hydrocortisone, diphenhydrAMINE) suspension Swish and spit 5 mLs 3 (three) times daily. Patient not taking: Reported on 11/03/2024 08/11/24   Dolphus Reiter, MD  oxyCODONE  (OXY IR/ROXICODONE ) 5 MG immediate release tablet Take 1 tablet (5 mg total) by mouth every 4 (four) hours as needed for severe pain (pain score 7-10). Patient not taking: Reported on 11/03/2024 09/03/24   Bissell, Jaclyn M, PA-C  pantoprazole  (PROTONIX ) 40 MG tablet Take 1 tablet (40 mg total) by mouth 2 (two) times daily before a meal. 04/02/24 11/03/24  Mansouraty, Aloha Raddle., MD  polyethylene glycol (MIRALAX  / GLYCOLAX ) 17 g packet Take 17 g by mouth daily. Patient not taking: Reported on 11/03/2024 09/03/24   Bissell, Jaclyn M,  PA-C  ROSUVASTATIN  CALCIUM  PO Take by mouth.    [provider]  sodium zirconium cyclosilicate  (LOKELMA ) 10 g PACK packet Take 10 g by mouth 3 (three) times a week.    [provider]  telmisartan  (MICARDIS ) 40 MG tablet Take 1 tablet (40 mg total) by mouth daily. 01/27/24   Tobb, Kardie, DO  telmisartan  (MICARDIS ) 40 MG tablet Take 1 tablet (40 mg total) by mouth daily. 07/03/24   Raford Riggs, MD  tizanidine  (ZANAFLEX ) 2 MG capsule Take 4 mg by mouth at bedtime. 07/03/22   [provider]  traMADol  (ULTRAM ) 50 MG tablet Take 50 mg by mouth every 4 (four) hours as needed. 09/28/24   [provider]    Allergies: Plaquenil  [hydroxychloroquine ], Penicillins, Sulfa antibiotics, and Sulfamethoxazole-trimethoprim    Review of Systems  Musculoskeletal:  Positive for back pain.    Updated Vital Signs BP (!) 172/81 (BP Location: Left Arm)   Pulse 94   Temp 98.7 F (37.1 C) (Temporal)   Resp 14   Ht 5' 2 (1.575 m)   Wt 69.1 kg   SpO2 100%   BMI 27.88 kg/m   Physical Exam  (all labs ordered are listed, but only abnormal results are displayed) Labs Reviewed  CBC  BASIC METABOLIC PANEL WITH GFR    EKG: None  Radiology: MR Lumbar Spine W Wo Contrast Result Date: 11/16/2024 EXAM: MRI LUMBAR SPINE 11/16/2024 05:36:39 PM TECHNIQUE: Multiplanar multisequence MRI of the lumbar spine was performed with and without the administration of intravenous contrast. COMPARISON: Lumbar spine radiographs 08/31/2024 and 09/03/2024. CLINICAL HISTORY: Right hip and leg pain. History of lumbar surgery. FINDINGS: BONES AND ALIGNMENT: 5 lumbar type vertebrae. Mild lumbar dextroscoliosis. Trace anterolisthesis of L3 on L4. No fracture, suspicious marrow lesion, or evidence of discitis. Periarticular edema associated with right L4-L5 facet arthrosis. SPINAL CORD: The conus medullaris terminates at L1 and is normal in signal. SOFT TISSUES: Postoperative changes in the posterior lumbar soft tissues. 1 cm fluid collection in the operative bed on the right at L4-L5, likely postoperative seroma. DISC LEVELS: Disc desiccation and mild to moderate disc space narrowing from L2-L3 through L4-L5. T12-L1: Negative. L1-L2: Normal disc. Mild facet hypertrophy without stenosis. L2-L3: Disc bulging, a left subarticular to foraminal disc protrusion, and mild to moderate facet hypertrophy result in mild to moderate left lateral recess stenosis and mild to moderate left neural foraminal stenosis  without spinal stenosis. L3-L4: Anterolisthesis with diffuse bulging of uncovered disc and severe facet hypertrophy result in moderate right and mild left lateral recess stenosis and mild right and moderate left neural foraminal stenosis without spinal stenosis. L4-L5: Previous right laminectomy and partial facetectomy. Circumferential disc bulging eccentric to the right, a moderately large right foraminal and extraforaminal disc protrusion, and residual moderate right and mild left facet hypertrophy result in moderate to severe right neural foraminal stenosis without spinal stenosis. L5-S1: Negative. IMPRESSION: 1. Right foraminal and extraforaminal disc protrusion at L4-L5 with moderate to severe right neural foraminal stenosis. 2. Moderate right and mild left lateral recess stenosis and mild right and moderate left neural foraminal stenosis at L3-L4. 3. Mild to moderate left lateral recess and left neural foraminal stenosis at L2-3. Electronically signed by: Dasie Hamburg MD 11/16/2024 06:03 PM EST RP Workstation: [id number]     .Critical Care  Performed by: Elnor Bernarda SQUIBB, DO Authorized by: Elnor Bernarda SQUIBB, DO   Critical care provider statement:    Critical care time (minutes):  30   Critical  care was time spent personally by me on the following activities:  Development of treatment plan with patient or surrogate, discussions with consultants, evaluation of patient's response to treatment, examination of patient, ordering and review of laboratory studies, ordering and review of radiographic studies, ordering and performing treatments and interventions, pulse oximetry, re-evaluation of patient's condition and review of old charts   Care discussed with: admitting provider     Care discussed with comment:  Orthospine    Medications Ordered in the ED  methocarbamol  (ROBAXIN ) tablet 500 mg (500 mg Oral Given 11/16/24 1550)  fentaNYL  (SUBLIMAZE ) injection 50 mcg (50 mcg Intravenous Given 11/16/24 1558)   methylPREDNISolone  sodium succinate (SOLU-MEDROL ) 125 mg/2 mL injection 60 mg (60 mg Intravenous Given 11/16/24 1553)  gadobutrol  (GADAVIST ) 1 MMOL/ML injection 7 mL (7 mLs Intravenous Contrast Given 11/16/24 1729)  morphine  (PF) 4 MG/ML injection 4 mg (4 mg Intravenous Given 11/16/24 1928)  acetaminophen  (TYLENOL ) tablet 650 mg (650 mg Oral Given 11/16/24 1928)  ketorolac  (TORADOL ) 15 MG/ML injection 15 mg (15 mg Intravenous Given 11/16/24 1929)                                    Medical Decision Making Amount and/or Complexity of Data Reviewed Radiology: ordered.  Risk OTC drugs. Prescription drug management. Decision regarding hospitalization.   69 year old female with significant past medical history for L4-L5 right-sided spinal stenosis with right hemilaminectomy with foraminotomies of L4 and L5, partial medial hemifacetectomy L4-L5 right, and lysis of extensive epidural venous plexus L4-L5 right on 09/03/2024 with Dr. Duwayne presenting for acute right sided low back pain.  Patient is alert and oriented x 3, hypertensive at 169/69.  Afebrile, stable vital signs.  Physical exam and straits right sided paraspinal lumbar spasm.  Lower extremities neurovascularly intact.  Minimal midline lumbar spinal tenderness.  Differential diagnosis includes but is not limited to: Favors lumbar paraspinal muscle spasm with sciatica Lumbar disc protrusion with sciatica Acute on chronic spinal stenosis  Less likely: Cauda equina  Robaxin , fentanyl , steroids given.  On reevaluation patient has some improvement of pain that lasted approximately 2 hours.  She is currently writhing around in pain.  Morphine  and Tylenol  ordered.  I spoke with Dr. Melita who is a part of [facility] with Dr. Dodson office who agrees that this is nonsurgical.  Recommends prednisone  taper upon discharge pain control.   Improvement in pain.  Patient recommended for revision for intractable low back pain likely secondary to  lumbar paraspinal muscle spasm, disc protrusions, and sciatica.  Patient agreeable to plan.  I spoke admitting team who agrees to accept patient.     Final diagnoses:  Intractable low back pain  Protrusion of lumbar intervertebral disc  Foraminal stenosis of lumbar region  S/P lumbar laminectomy    ED Discharge Orders     None          Elnor Bernarda SQUIBB, DO 11/16/24 1933    Elnor Bernarda SQUIBB, DO 11/16/24 1933  "

## 2024-11-16 NOTE — ED Notes (Signed)
 Pt pulled into Trauma B and placed on Bed pan.  Voided approx. 100 mL.

## 2024-11-16 NOTE — ED Triage Notes (Signed)
 Patient BIB [facility] from home for back pain, patient had surgery on her back in Oct and shoveled snow on Saturday causing excruciating back pain. Back pain starts in her R hip and radiates down her R leg but she states it is not like sciatica.  BP 178/82 HR 74 RR 18 99% RA

## 2024-11-17 ENCOUNTER — Observation Stay (HOSPITAL_COMMUNITY)

## 2024-11-17 LAB — CBC
HCT: 35.5 % — ABNORMAL LOW (ref 36.0–46.0)
Hemoglobin: 11.3 g/dL — ABNORMAL LOW (ref 12.0–15.0)
MCH: 29.8 pg (ref 26.0–34.0)
MCHC: 31.8 g/dL (ref 30.0–36.0)
MCV: 93.7 fL (ref 80.0–100.0)
Platelets: 455 10*3/uL — ABNORMAL HIGH (ref 150–400)
RBC: 3.79 MIL/uL — ABNORMAL LOW (ref 3.87–5.11)
RDW: 13.4 % (ref 11.5–15.5)
WBC: 5.5 10*3/uL (ref 4.0–10.5)
nRBC: 0 % (ref 0.0–0.2)

## 2024-11-17 LAB — BASIC METABOLIC PANEL WITH GFR
Anion gap: 10 (ref 5–15)
BUN: 28 mg/dL — ABNORMAL HIGH (ref 8–23)
CO2: 22 mmol/L (ref 22–32)
Calcium: 8.8 mg/dL — ABNORMAL LOW (ref 8.9–10.3)
Chloride: 108 mmol/L (ref 98–111)
Creatinine, Ser: 1.2 mg/dL — ABNORMAL HIGH (ref 0.44–1.00)
GFR, Estimated: 49 mL/min — ABNORMAL LOW
Glucose, Bld: 167 mg/dL — ABNORMAL HIGH (ref 70–99)
Potassium: 5 mmol/L (ref 3.5–5.1)
Sodium: 140 mmol/L (ref 135–145)

## 2024-11-17 MED ORDER — OXYCODONE HCL 5 MG PO TABS
10.0000 mg | ORAL_TABLET | ORAL | Status: AC | PRN
  Administered 2024-11-17 – 2024-11-19 (×9): 10 mg via ORAL
  Filled 2024-11-17 (×9): qty 2

## 2024-11-17 MED ORDER — METHOCARBAMOL 500 MG PO TABS
500.0000 mg | ORAL_TABLET | Freq: Four times a day (QID) | ORAL | Status: AC | PRN
  Administered 2024-11-17 – 2024-11-20 (×6): 500 mg via ORAL
  Filled 2024-11-17 (×6): qty 1

## 2024-11-17 NOTE — Evaluation (Signed)
 Physical Therapy Evaluation Patient Details Name: Debbie Hatfield MRN: [id number] DOB: [birth year] Today's Date: 11/17/2024  History of Present Illness  Debbie Hatfield is a 69 yo female who presented with excruciating back pain. MRI demonstrates right foraminal and extraforaminal disc protrusion at L4-L5 with moderate to severe right neural foraminal stenosis. Pt with a past medical hisotry of 08/2024 back sx, undifferentiated connective tissue disease, HTN, trigeminal neuralgia (left), and IBS  Clinical Impression  Pt presents with admitting diagnosis above. Pt today very limited by 10/10 back and buttock pain. Pt able to sit EOB with Min A however due to spasms pt was constantly readjusting attempting to find a comfortable position and pt was unsuccessful requesting lay back down. Unable to stand today. At baseline pt is very active and cares for her 58 year old mother who is also very active. Based on today, patient will benefit from continued inpatient follow up therapy, <3 hours/day. PT will continue to follow.         If plan is discharge home, recommend the following: A lot of help with walking and/or transfers;A lot of help with bathing/dressing/bathroom;Assist for transportation;Help with stairs or ramp for entrance   Can travel by private vehicle   No    Equipment Recommendations None recommended by PT (Has needed DME)  Recommendations for Other Services       Functional Status Assessment Patient has had a recent decline in their functional status and demonstrates the ability to make significant improvements in function in a reasonable and predictable amount of time.     Precautions / Restrictions Precautions Precautions: Fall Restrictions Weight Bearing Restrictions Per Provider Order: No      Mobility  Bed Mobility Overal bed mobility: Needs Assistance Bed Mobility: Rolling, Supine to Sit, Sit to Supine Rolling: Min assist   Supine to sit: Min assist Sit to supine: Mod  assist   General bed mobility comments: Log roll to EOB with increased time due to pain. Min A overall. Mod A to return to bed with assistance for BLE.    Transfers   Equipment used:  (+back of recliner chair)               General transfer comment: Unable to stand due to pain.    Ambulation/Gait               General Gait Details: Unable due to pain  Stairs            Wheelchair Mobility     Tilt Bed    Modified Rankin (Stroke Patients Only)       Balance Overall balance assessment: Needs assistance Sitting-balance support: Feet supported, Bilateral upper extremity supported Sitting balance-Leahy Scale: Poor                                       Pertinent Vitals/Pain Pain Assessment Pain Assessment: 0-10 Pain Score: 10-Worst pain ever Pain Location: spasm in R side of back and buttock Pain Descriptors / Indicators: Sharp, Shooting, Spasm Pain Intervention(s): Limited activity within patient's tolerance, Monitored during session, Premedicated before session, Repositioned    Home Living Family/patient expects to be discharged to:: Private residence Living Arrangements: Parent (mom is 42, healthy/strong indep) Available Help at Discharge: Family Type of Home: House Home Access: Level entry       Home Layout: One level Home Equipment: Agricultural Consultant (2 wheels);BSC/3in1;Shower seat -  built in;Hand held Stage Manager (4 wheels);Electric scooter      Prior Function Prior Level of Function : Independent/Modified Independent;Driving             Mobility Comments: indep no AD prior to onset of back pain. Pt was unable to get OOB since onset of back pain. ADLs Comments: indep prior to new back pain. for teh last 2 days pt was rolling to pee in a cup and crawled 1x to have a BM on the floor.SABRASABRApt was then stuck on the floor and had to call the FD     Extremity/Trunk Assessment   Upper Extremity Assessment Upper  Extremity Assessment: Defer to OT evaluation    Lower Extremity Assessment Lower Extremity Assessment: RLE deficits/detail RLE Deficits / Details: Severe R buttock spasm with pain traveling down to knee.    Cervical / Trunk Assessment Cervical / Trunk Assessment: Other exceptions Cervical / Trunk Exceptions: 08/2024 back sx; new 10/10 back pain  Communication   Communication Communication: No apparent difficulties    Cognition Arousal: Alert Behavior During Therapy: WFL for tasks assessed/performed   PT - Cognitive impairments: No apparent impairments                         Following commands: Intact       Cueing Cueing Techniques: Verbal cues     General Comments General comments (skin integrity, edema, etc.): VSS. Very limited by pain.    Exercises     Assessment/Plan    PT Assessment Patient needs continued PT services  PT Problem List Decreased strength;Decreased range of motion;Decreased activity tolerance;Decreased balance;Decreased mobility;Decreased coordination;Decreased knowledge of use of DME;Decreased safety awareness;Decreased knowledge of precautions;Pain       PT Treatment Interventions DME instruction;Gait training;Stair training;Functional mobility training;Therapeutic activities;Therapeutic exercise;Balance training;Neuromuscular re-education;Patient/family education;Wheelchair mobility training    PT Goals (Current goals can be found in the Care Plan section)  Acute Rehab PT Goals Patient Stated Goal: to go home PT Goal Formulation: With patient Time For Goal Achievement: 12/01/24 Potential to Achieve Goals: Fair    Frequency Min 3X/week     Co-evaluation               AM-PAC PT 6 Clicks Mobility  Outcome Measure Help needed turning from your back to your side while in a flat bed without using bedrails?: A Little Help needed moving from lying on your back to sitting on the side of a flat bed without using bedrails?: A  Little Help needed moving to and from a bed to a chair (including a wheelchair)?: Total Help needed standing up from a chair using your arms (e.g., wheelchair or bedside chair)?: A Lot Help needed to walk in hospital room?: Total Help needed climbing 3-5 steps with a railing? : Total 6 Click Score: 11    End of Session Equipment Utilized During Treatment: Gait belt Activity Tolerance: Patient limited by pain Patient left: in bed;with call bell/phone within reach Nurse Communication: Mobility status PT Visit Diagnosis: Other abnormalities of gait and mobility (R26.89)    Time: 8795-8774 PT Time Calculation (min) (ACUTE ONLY): 21 min   Charges:   PT Evaluation $PT Eval Moderate Complexity: 1 Mod   PT General Charges $$ ACUTE PT VISIT: 1 Visit         Aristotle Lieb B, PT, DPT Acute Rehab Services [phone]   Jerzy Crotteau 11/17/2024, 1:15 PM

## 2024-11-17 NOTE — NC FL2 (Addendum)
 " [place]  MEDICAID FL2 LEVEL OF CARE FORM     IDENTIFICATION  Patient Name: Debbie Hatfield Birthdate: 1956/08/04 Sex: female Admission Date (Current Location): 11/16/2024  [facility] and [place] Number:  Producer, Television/film/video and Address:  [facility], [address] [place], [place] [address]      Provider Number: [id number]  Attending Physician Name and Address:  Mosie Ford, MD  Relative Name and Phone Number:  Andree Shepley/sister: [phone]    Current Level of Care: Hospital Recommended Level of Care: Skilled Nursing Facility Prior Approval Number:    Date Approved/Denied:   PASRR Number: [id number]  Discharge Plan: SNF    Current Diagnoses: Patient Active Problem List   Diagnosis Date Noted   Protrusion of lumbar intervertebral disc 11/16/2024   History of depression 11/16/2024   History of sinus bradycardia 11/16/2024   History of systemic lupus erythematosus (SLE) (HCC) 11/16/2024   Osteoarthritis of right hip 11/16/2024   Spinal stenosis at L4-L5 level 09/03/2024   History of musculoskeletal disease 05/05/2024   Screening mammogram for breast cancer 05/05/2024   Need for prophylactic vaccination and inoculation against varicella 05/05/2024   Major depressive disorder with current active episode 04/21/2024   Age-related osteoporosis without current pathological fracture 03/16/2024   History of CVA (cerebrovascular accident) 10/03/2023   Sinus bradycardia 10/03/2023   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   Hyperlipidemia LDL goal <100 03/26/2022   Recurrent major depressive disorder, in partial remission 11/15/2021   GAD (generalized anxiety disorder) 11/15/2021   CKD stage 3a, GFR 45-59 ml/min (HCC) 11/15/2021   Trigeminal neuralgia    Lupus    Irritable bowel disease    GERD (gastroesophageal reflux disease)    Hypertension    Gastroparesis    Degeneration of lumbar intervertebral disc 01/26/2020   Peptic ulcer disease  12/14/2015    Orientation RESPIRATION BLADDER Height & Weight     Self, Time, Situation, Place  Normal Continent Weight: 69.1 kg Height:  5' 2 (157.5 cm)  BEHAVIORAL SYMPTOMS/MOOD NEUROLOGICAL BOWEL NUTRITION STATUS      Continent Diet (See discharge summary)  AMBULATORY STATUS COMMUNICATION OF NEEDS Skin   Extensive Assist Verbally Normal                       Personal Care Assistance Level of Assistance  Bathing, Feeding, Dressing Bathing Assistance: Limited assistance Feeding assistance: Independent Dressing Assistance: Maximum assistance     Functional Limitations Info  Sight, Hearing, Speech Sight Info: Adequate Hearing Info: Adequate Speech Info: Adequate    SPECIAL CARE FACTORS FREQUENCY  OT (By licensed OT), PT (By licensed PT)     PT Frequency: 5 x per week OT Frequency: 5 x per week            Contractures Contractures Info: Not present    Additional Factors Info  Code Status, Allergies Code Status Info: Full Code Allergies Info: Plaquenil , penicillins, sulfa antibiotics, sulfamethoxazole-trimethoprim           Current Medications (11/17/2024):  This is the current hospital active medication list Current Facility-Administered Medications  Medication Dose Route Frequency Provider Last Rate Last Admin   0.9 %  sodium chloride  infusion  250 mL Intravenous PRN Sundil, Subrina, MD       acetaminophen  (TYLENOL ) tablet 650 mg  650 mg Oral Q6H PRN Sundil, Subrina, MD       Or   acetaminophen  (TYLENOL ) suppository 650 mg  650 mg Rectal Q6H  PRN Sundil, Subrina, MD       amLODipine  (NORVASC ) tablet 5 mg  5 mg Oral Daily Sundil, Subrina, MD   5 mg at 11/17/24 1013   clopidogrel  (PLAVIX ) tablet 75 mg  75 mg Oral Daily Sundil, Subrina, MD   75 mg at 11/17/24 1013   doxepin  (SINEQUAN ) capsule 75 mg  75 mg Oral QHS Sundil, Subrina, MD   75 mg at 11/16/24 2306   DULoxetine  (CYMBALTA ) DR capsule 30 mg  30 mg Oral QPC breakfast Sundil, Subrina, MD   30 mg at  11/17/24 1013   DULoxetine  (CYMBALTA ) DR capsule 60 mg  60 mg Oral QHS Sundil, Subrina, MD   60 mg at 11/16/24 2123   enoxaparin  (LOVENOX ) injection 40 mg  40 mg Subcutaneous Q24H Sundil, Subrina, MD       gabapentin  (NEURONTIN ) capsule 600 mg  600 mg Oral BID Sundil, Subrina, MD   600 mg at 11/17/24 1012   HYDROmorphone  (DILAUDID ) injection 0.5-1 mg  0.5-1 mg Intravenous Q4H PRN Sundil, Subrina, MD       irbesartan  (AVAPRO ) tablet 75 mg  75 mg Oral Daily Sundil, Subrina, MD   75 mg at 11/17/24 1102   methocarbamol  (ROBAXIN ) tablet 500 mg  500 mg Oral Q6H PRN Al-Sultani, Anmar, MD       ondansetron  (ZOFRAN ) tablet 4 mg  4 mg Oral Q6H PRN Sundil, Subrina, MD   4 mg at 11/17/24 0234   Or   ondansetron  (ZOFRAN ) injection 4 mg  4 mg Intravenous Q6H PRN Sundil, Subrina, MD       oxyCODONE  (Oxy IR/ROXICODONE ) immediate release tablet 10 mg  10 mg Oral Q4H PRN Al-Sultani, Anmar, MD   10 mg at 11/17/24 1323   oxyCODONE  (Oxy IR/ROXICODONE ) immediate release tablet 5 mg  5 mg Oral Q6H PRN Sundil, Subrina, MD   5 mg at 11/17/24 1012   pantoprazole  (PROTONIX ) EC tablet 40 mg  40 mg Oral BID AC Sundil, Subrina, MD   40 mg at 11/17/24 1013   [START ON 11/18/2024] predniSONE  (DELTASONE ) tablet 40 mg  40 mg Oral Q breakfast Sundil, Subrina, MD       Followed by   [START ON 11/19/2024] predniSONE  (DELTASONE ) tablet 30 mg  30 mg Oral Q breakfast Sundil, Subrina, MD       Followed by   [START ON 11/20/2024] predniSONE  (DELTASONE ) tablet 20 mg  20 mg Oral Q breakfast Sundil, Subrina, MD       Followed by   NOREEN ON 11/21/2024] predniSONE  (DELTASONE ) tablet 10 mg  10 mg Oral Q breakfast Sundil, Subrina, MD       Followed by   NOREEN ON 11/22/2024] predniSONE  (DELTASONE ) tablet 5 mg  5 mg Oral Q breakfast Sundil, Subrina, MD       senna-docusate (Senokot-S) tablet 1 tablet  1 tablet Oral BID Sundil, Subrina, MD   1 tablet at 11/17/24 1012   sodium chloride  flush (NS) 0.9 % injection 3 mL  3 mL Intravenous Q12H Sundil,  Subrina, MD   3 mL at 11/17/24 1016   sodium chloride  flush (NS) 0.9 % injection 3 mL  3 mL Intravenous Q12H Sundil, Subrina, MD   3 mL at 11/17/24 1016   sodium chloride  flush (NS) 0.9 % injection 3 mL  3 mL Intravenous PRN Sundil, Subrina, MD   3 mL at 11/16/24 2138     Discharge Medications: Please see discharge summary for a list of discharge medications.  Relevant Imaging Results:  Relevant Lab Results:   Additional Information Pt's SSN:[id number]  Rosaline JONELLE Joe, RN     "

## 2024-11-17 NOTE — ED Notes (Signed)
 Floor notiied

## 2024-11-18 ENCOUNTER — Ambulatory Visit (HOSPITAL_COMMUNITY): Payer: Self-pay | Admitting: Physician Assistant

## 2024-11-18 LAB — CBC
HCT: 33.9 % — ABNORMAL LOW (ref 36.0–46.0)
Hemoglobin: 10.7 g/dL — ABNORMAL LOW (ref 12.0–15.0)
MCH: 29.6 pg (ref 26.0–34.0)
MCHC: 31.6 g/dL (ref 30.0–36.0)
MCV: 93.9 fL (ref 80.0–100.0)
Platelets: 441 10*3/uL — ABNORMAL HIGH (ref 150–400)
RBC: 3.61 MIL/uL — ABNORMAL LOW (ref 3.87–5.11)
RDW: 13.6 % (ref 11.5–15.5)
WBC: 12.9 10*3/uL — ABNORMAL HIGH (ref 4.0–10.5)
nRBC: 0 % (ref 0.0–0.2)

## 2024-11-18 LAB — BASIC METABOLIC PANEL WITH GFR
Anion gap: 9 (ref 5–15)
BUN: 26 mg/dL — ABNORMAL HIGH (ref 8–23)
CO2: 24 mmol/L (ref 22–32)
Calcium: 8.8 mg/dL — ABNORMAL LOW (ref 8.9–10.3)
Chloride: 107 mmol/L (ref 98–111)
Creatinine, Ser: 1.08 mg/dL — ABNORMAL HIGH (ref 0.44–1.00)
GFR, Estimated: 56 mL/min — ABNORMAL LOW
Glucose, Bld: 150 mg/dL — ABNORMAL HIGH (ref 70–99)
Potassium: 4.9 mmol/L (ref 3.5–5.1)
Sodium: 141 mmol/L (ref 135–145)

## 2024-11-18 MED ORDER — DULOXETINE HCL 60 MG PO CPEP
90.0000 mg | ORAL_CAPSULE | Freq: Every day | ORAL | Status: AC
  Administered 2024-11-19 – 2024-11-20 (×2): 90 mg via ORAL
  Filled 2024-11-18 (×2): qty 1

## 2024-11-18 MED ORDER — POLYETHYLENE GLYCOL 3350 17 G PO PACK
17.0000 g | PACK | Freq: Every day | ORAL | Status: AC
  Administered 2024-11-18 – 2024-11-19 (×2): 17 g via ORAL
  Filled 2024-11-18: qty 1

## 2024-11-18 MED ORDER — POLYETHYLENE GLYCOL 3350 17 G PO PACK
17.0000 g | PACK | Freq: Every day | ORAL | Status: AC | PRN
  Administered 2024-11-18: 17 g via ORAL
  Filled 2024-11-18 (×2): qty 1

## 2024-11-18 MED ORDER — ALUM HYDROXIDE-MAG CARBONATE 95-358 MG/15ML PO SUSP
30.0000 mL | ORAL | Status: AC | PRN
  Administered 2024-11-18 – 2024-11-19 (×4): 30 mL via ORAL
  Filled 2024-11-18 (×6): qty 30

## 2024-11-18 MED ORDER — DULOXETINE HCL 60 MG PO CPEP: 60.0000 mg | ORAL_CAPSULE | Freq: Every day | ORAL | Status: AC

## 2024-11-18 NOTE — Progress Notes (Signed)
 " PROGRESS NOTE    Debbie Hatfield  FMW:[id number] DOB: 08-Dec-1955 DOA: 11/16/2024 PCP: Joshua Debby CROME, MD   Brief Narrative: 69 year old with past medical history significant for systemic lupus, hypertension, osteoarthritis, CKD stage IIIa, IBS, MDD, GAD who presents to the ED 09/16/19/2026 due to back pain.  Patient reports excruciating back pain that started 2 days prior after she was shoveling snow at home.  She had undergone right L4-L5 microdissecting approximately 3 months ago with complete resolution of her sciatic symptoms.  MRI lumbar spine showed right foraminal and extraforaminal disc protrusion at L4-L5 with moderate to severe right neuroforaminal stenosis.  Moderate right and minimal left lateral recess stenosis and mild right and moderate left neural foraminal stenosis L3-L4.  Mild to moderate left lateral recess and left neural foraminal stenosis L2-L3. Initial recommendation from orthopedic was prednisone  taper and follow-up as an outpatient but patient has uncontrolled pain and inability to ambulate and require rehab.  Ortho reconsulted and plan is for surgery on Friday   Assessment & Plan:   Principal Problem:   Protrusion of lumbar intervertebral disc Active Problems:   Irritable bowel disease   Hypertension   Gastroparesis   CKD stage 3a, GFR 45-59 ml/min (HCC)   History of CVA (cerebrovascular accident)   History of musculoskeletal disease   Spinal stenosis at L4-L5 level   History of depression   History of sinus bradycardia   History of systemic lupus erythematosus (SLE) (HCC)   Osteoarthritis of right hip   1-Lumbar intervertebral disc protrusion with L4 radiculopathy Patient received IV Dilaudid  last night and today, pain is not controlled on oxycodone .  Patient with inability to ambulate due to pain and weakness , plan is to proceed with Sx on Friday.  MRI lumbar spine:showed right foraminal and extraforaminal disc protrusion L4-L5 with moderate to severe right  neural foraminal stenosis.  On prednisone , no significant improvement.  CKD 3 A Continue to monitor renal function  History of CVA Holding Plavix  for surgical procedure on Friday  Hypertension Continue amlodipine  and Avapro   Depression Continue Cymbalta  and Seroquel   SLE Needs follow-up with rheumatology as an outpatient   Estimated body mass index is 27.88 kg/m as calculated from the following:   Height as of this encounter: 5' 2 (1.575 m).   Weight as of this encounter: 69.1 kg.   DVT prophylaxis:  Code Status: Plan to continue with full code, patient does not wish to be on life support for a long.  Of time Family Communication: Discussed with patient Disposition Plan:  Status is: Observation The patient remains OBS appropriate and will d/c before 2 midnights.    Consultants:  Ortho  Procedures:  Back surgery  Antimicrobials:    Subjective: Having a lot of difficulty with back pain, radiating to her leg, inability to tolerate ambulation  Objective: Vitals:   11/17/24 2022 11/17/24 2318 11/18/24 0407 11/18/24 0851  BP: (!) 153/78 (!) 164/85 (!) 155/83 137/64  Pulse:  (!) 101 97 95  Resp: 20 16  17   Temp: 98 F (36.7 C) 97.8 F (36.6 C) 97.6 F (36.4 C) 97.9 F (36.6 C)  TempSrc:  Oral  Oral  SpO2: 100% 100% 100% 100%  Weight:      Height:        Intake/Output Summary (Last 24 hours) at 11/18/2024 0910 Last data filed at 11/17/2024 1013 Gross per 24 hour  Intake --  Output 250 ml  Net -250 ml   [employer]  11/16/24 1436  Weight: 69.1 kg    Examination:  General exam: Appears calm and comfortable  Respiratory system: Clear to auscultation. Respiratory effort normal. Cardiovascular system: S1 & S2 heard, RRR. No JVD, murmurs, rubs, gallops or clicks. No pedal edema. Gastrointestinal system: Abdomen is nondistended, soft and nontender. No organomegaly or masses felt. Normal bowel sounds heard. Central nervous system: Alert and oriented.  No focal neurological deficits. Extremities: right LE weakness   Data Reviewed: I have personally reviewed following labs and imaging studies  CBC: Recent Labs  Lab 11/16/24 2010 11/17/24 0426 11/18/24 0352  WBC 7.8 5.5 12.9*  HGB 11.9* 11.3* 10.7*  HCT 37.0 35.5* 33.9*  MCV 92.3 93.7 93.9  PLT 477* 455* 441*   Basic Metabolic Panel: Recent Labs  Lab 11/16/24 2010 11/17/24 0426 11/18/24 0352  NA 140 140 141  K 4.8 5.0 4.9  CL 109 108 107  CO2 20* 22 24  GLUCOSE 182* 167* 150*  BUN 25* 28* 26*  CREATININE 1.27* 1.20* 1.08*  CALCIUM  8.9 8.8* 8.8*   GFR: Estimated Creatinine Clearance: 45.4 mL/min (A) (by C-G formula based on SCr of 1.08 mg/dL (H)). Liver Function Tests: No results for input(s): AST, ALT, ALKPHOS, BILITOT, PROT, ALBUMIN  in the last 168 hours. No results for input(s): LIPASE, AMYLASE in the last 168 hours. No results for input(s): AMMONIA in the last 168 hours. Coagulation Profile: No results for input(s): INR, PROTIME in the last 168 hours. Cardiac Enzymes: No results for input(s): CKTOTAL, CKMB, CKMBINDEX, TROPONINI in the last 168 hours. BNP (last 3 results) No results for input(s): PROBNP in the last 8760 hours. HbA1C: No results for input(s): HGBA1C in the last 72 hours. CBG: No results for input(s): GLUCAP in the last 168 hours. Lipid Profile: No results for input(s): CHOL, HDL, LDLCALC, TRIG, CHOLHDL, LDLDIRECT in the last 72 hours. Thyroid  Function Tests: No results for input(s): TSH, T4TOTAL, FREET4, T3FREE, THYROIDAB in the last 72 hours. Anemia Panel: No results for input(s): VITAMINB12, FOLATE, FERRITIN, TIBC, IRON, RETICCTPCT in the last 72 hours. Sepsis Labs: No results for input(s): PROCALCITON, LATICACIDVEN in the last 168 hours.  No results found for this or any previous visit (from the past 240 hours).       Radiology Studies: DG Lumbar Spine 2-3  Views Result Date: 11/17/2024 CLINICAL DATA:  Status post lumbar surgery. EXAM: LUMBAR SPINE - 2-3 VIEW COMPARISON:  Lumbar MRI yesterday FINDINGS: Five non-rib-bearing lumbar vertebra. Broad-based dextroscoliotic curvature. Trace anterolisthesis of L3 on L4. Vertebral body heights are normal, no evidence of fracture or focal bone abnormality. Multilevel disc space narrowing and spurring, greatest at L3-L4. Multilevel facet hypertrophy. The right L4-L5 laminectomy on MRI is not well delineated by radiograph. IMPRESSION: 1. Multilevel degenerative disc disease and facet hypertrophy in the lumbar spine. 2. Trace anterolisthesis of L3 on L4. Broad-based dextroscoliotic curvature. Electronically Signed   By: Andrea Gasman M.D.   On: 11/17/2024 21:22   MR Lumbar Spine W Wo Contrast Result Date: 11/16/2024 EXAM: MRI LUMBAR SPINE 11/16/2024 05:36:39 PM TECHNIQUE: Multiplanar multisequence MRI of the lumbar spine was performed with and without the administration of intravenous contrast. COMPARISON: Lumbar spine radiographs 08/31/2024 and 09/03/2024. CLINICAL HISTORY: Right hip and leg pain. History of lumbar surgery. FINDINGS: BONES AND ALIGNMENT: 5 lumbar type vertebrae. Mild lumbar dextroscoliosis. Trace anterolisthesis of L3 on L4. No fracture, suspicious marrow lesion, or evidence of discitis. Periarticular edema associated with right L4-L5 facet arthrosis. SPINAL CORD: The conus medullaris terminates at L1  and is normal in signal. SOFT TISSUES: Postoperative changes in the posterior lumbar soft tissues. 1 cm fluid collection in the operative bed on the right at L4-L5, likely postoperative seroma. DISC LEVELS: Disc desiccation and mild to moderate disc space narrowing from L2-L3 through L4-L5. T12-L1: Negative. L1-L2: Normal disc. Mild facet hypertrophy without stenosis. L2-L3: Disc bulging, a left subarticular to foraminal disc protrusion, and mild to moderate facet hypertrophy result in mild to moderate left  lateral recess stenosis and mild to moderate left neural foraminal stenosis without spinal stenosis. L3-L4: Anterolisthesis with diffuse bulging of uncovered disc and severe facet hypertrophy result in moderate right and mild left lateral recess stenosis and mild right and moderate left neural foraminal stenosis without spinal stenosis. L4-L5: Previous right laminectomy and partial facetectomy. Circumferential disc bulging eccentric to the right, a moderately large right foraminal and extraforaminal disc protrusion, and residual moderate right and mild left facet hypertrophy result in moderate to severe right neural foraminal stenosis without spinal stenosis. L5-S1: Negative. IMPRESSION: 1. Right foraminal and extraforaminal disc protrusion at L4-L5 with moderate to severe right neural foraminal stenosis. 2. Moderate right and mild left lateral recess stenosis and mild right and moderate left neural foraminal stenosis at L3-L4. 3. Mild to moderate left lateral recess and left neural foraminal stenosis at L2-3. Electronically signed by: Dasie Hamburg MD 11/16/2024 06:03 PM EST RP Workstation: [id number]        Scheduled Meds:  amLODipine   5 mg Oral Daily   doxepin   75 mg Oral QHS   DULoxetine   30 mg Oral QPC breakfast   DULoxetine   60 mg Oral QHS   enoxaparin  (LOVENOX ) injection  40 mg Subcutaneous Q24H   gabapentin   600 mg Oral BID   irbesartan   75 mg Oral Daily   pantoprazole   40 mg Oral BID AC   [START ON 11/19/2024] predniSONE   30 mg Oral Q breakfast   senna-docusate  1 tablet Oral BID   sodium chloride  flush  3 mL Intravenous Q12H   sodium chloride  flush  3 mL Intravenous Q12H   Continuous Infusions:   LOS: 0 days    Time spent: 35 Minutes    Lurlean Kernen A Deneene Tarver, MD [facility]   If 7PM-7AM, please contact night-coverage [email or web address]  11/18/2024, 9:10 AM   "

## 2024-11-18 NOTE — Progress Notes (Signed)
" ° ° °  Subjective:     Patient reports pain as severe.  Patient is still having severe right leg pain  Positive void Negative bowel movement Positive flatus Negative chest pain or shortness of breath  Objective: Vital signs in last 24 hours: Temp:  [97.6 F (36.4 C)-98 F (36.7 C)] 97.6 F (36.4 C) (02/04 0407) Pulse Rate:  [95-104] 97 (02/04 0407) Resp:  [15-20] 16 (02/03 2318) BP: (130-164)/(70-89) 155/83 (02/04 0407) SpO2:  [99 %-100 %] 100 % (02/04 0407)  Intake/Output from previous day: 02/03 0701 - 02/04 0700 In: -  Out: 250 [Urine:250]  Labs: Recent Labs    11/17/24 0426 11/18/24 0352  WBC 5.5 12.9*  RBC 3.79* 3.61*  HCT 35.5* 33.9*  PLT 455* 441*   Recent Labs    11/17/24 0426 11/18/24 0352  NA 140 141  K 5.0 4.9  CL 108 107  CO2 22 24  BUN 28* 26*  CREATININE 1.20* 1.08*  GLUCOSE 167* 150*  CALCIUM  8.8* 8.8*   No results for input(s): LABPT, INR in the last 72 hours.  Physical Exam: ABD soft Intact pulses distally Compartment soft Lumbar spine: Well-healed surgical scar from recent lumbar discectomy in the lower lumbar region.  Significant back pain radiating into the right lower extremity.  No direct SI joint pain.  No hip, knee, ankle pain with isolated joint range of motion. Neurological exam: Left lower extremity: 5/5 motor strength throughout the lower extremity.  Negative nerve root tension signs.  Negative Babinski test.  No clonus.  1+ deep tendon reflexes at the knee absent at the Achilles. Right lower extremity: 3/5 right hip/quad strength.  4+/5 EHL/tibialis anterior, 5/5 gastrocnemius strength.  Positive femoral stretch test.  Negative straight leg raise test.  Negative Babinski test.  No clonus.  1+ deep tendon reflexes at the knee absent at the Achilles   Body mass index is 27.88 kg/m.   Assessment/Plan: Debbie Hatfield is a very pleasant 68 year old woman who had a prior right L4-L5 microdiscectomy  3 months ago with Dr. Duwayne and now  unfortunately after shoveling snow has a recurrent foraminal./extra foraminal disc herniation at L4-L5 on the right side. Plan is to proceed with TLIF L4-L5 Friday afternoon. Patient will stop Lovenox  for Dvt prevention 24 hours prior to surgery. Patient has stopped plavix .Patient expressed some heartburn/ indigestion and stated that gaviscon works bets for her at home therefore, we did start her on that this morning. If she does not have a bowel movement today we may provide mag citrate for her constipation.   Patient stable  xrays n/a Continue mobilization  Continue care    [employer] PA-C for Dr. Donaciano Sprang, MD [facility] [phone]  "

## 2024-11-18 NOTE — Progress Notes (Signed)
 SDOH reviewed and resources added to AVS.

## 2024-11-18 NOTE — Progress Notes (Signed)
 Physical Therapy Treatment Patient Details Name: Debbie Hatfield MRN: [id number] DOB: 17-Apr-1956 Today's Date: 11/18/2024   History of Present Illness Debbie Hatfield is a 69 yo female who presented with excruciating back pain. MRI demonstrates right foraminal and extraforaminal disc protrusion at L4-L5 with moderate to severe right neural foraminal stenosis. Pt with a past medical hisotry of 08/2024 back sx, undifferentiated connective tissue disease, HTN, trigeminal neuralgia (left), and IBS    PT Comments  Pt received in supine and agreeable to session. Pt reports slightly improved pain today and is able to progress mobility. Pt reports increased pain in sitting and demonstrates posterior lean EOB to offload. Pt requests to attempt transfer to [facility] and is able to pivot with CGA, but unable to tolerate sitting. Pt able to urinate while hovering over seat using heavy BUE support on armrests and requires assist for pericare. Pt able to perform short gait distance in room with BUE reliance to offload RLE due to little WB tolerance. Pt continues to benefit from PT services to progress toward functional mobility goals.    If plan is discharge home, recommend the following: A lot of help with walking and/or transfers;A lot of help with bathing/dressing/bathroom;Assist for transportation;Help with stairs or ramp for entrance   Can travel by private vehicle     No  Equipment Recommendations  None recommended by PT (Has needed DME)    Recommendations for Other Services       Precautions / Restrictions Precautions Precautions: Fall Restrictions Weight Bearing Restrictions Per Provider Order: No     Mobility  Bed Mobility Overal bed mobility: Needs Assistance Bed Mobility: Supine to Sit, Sit to Supine     Supine to sit: Contact guard, Used rails Sit to supine: Min assist   General bed mobility comments: increased time and use of LLE to manage RLE. Assist for RLE elevation back to EOB     Transfers Overall transfer level: Needs assistance Equipment used: Rolling walker (2 wheels) Transfers: Sit to/from Stand, Bed to chair/wheelchair/BSC Sit to Stand: Contact guard assist   Step pivot transfers: Contact guard assist       General transfer comment: From EOB with CGA for safety. Pivot to [facility] with CGA    Ambulation/Gait Ambulation/Gait assistance: Contact guard assist Gait Distance (Feet): 20 Feet Assistive device: Rolling walker (2 wheels) Gait Pattern/deviations: Step-to pattern, Decreased weight shift to right, Decreased stance time - right, Decreased stride length       General Gait Details: Pt with very little RLE WB on toes and heavy BUE reliance to offload. Good stability with RW support and CGA for safety   Stairs             Wheelchair Mobility     Tilt Bed    Modified Rankin (Stroke Patients Only)       Balance Overall balance assessment: Needs assistance Sitting-balance support: Feet supported, Bilateral upper extremity supported Sitting balance-Leahy Scale: Poor Sitting balance - Comments: Posterior lean with reliance on BUE support due to pain Postural control: Posterior lean Standing balance support: Bilateral upper extremity supported, During functional activity, Reliant on assistive device for balance Standing balance-Leahy Scale: Poor Standing balance comment: reliant on RW to offload RLE                            Communication Communication Communication: No apparent difficulties  Cognition Arousal: Alert Behavior During Therapy: WFL for tasks assessed/performed   PT -  Cognitive impairments: No apparent impairments                         Following commands: Intact      Cueing Cueing Techniques: Verbal cues  Exercises      General Comments        Pertinent Vitals/Pain Pain Assessment Pain Assessment: Faces Faces Pain Scale: Hurts whole lot Pain Location: R buttock and RLE Pain  Descriptors / Indicators: Sharp, Shooting, Spasm Pain Intervention(s): Limited activity within patient's tolerance, Monitored during session, Repositioned     PT Goals (current goals can now be found in the care plan section) Acute Rehab PT Goals Patient Stated Goal: to go home PT Goal Formulation: With patient Time For Goal Achievement: 12/01/24 Progress towards PT goals: Progressing toward goals    Frequency    Min 3X/week       AM-PAC PT 6 Clicks Mobility   Outcome Measure  Help needed turning from your back to your side while in a flat bed without using bedrails?: A Little Help needed moving from lying on your back to sitting on the side of a flat bed without using bedrails?: A Little Help needed moving to and from a bed to a chair (including a wheelchair)?: A Little Help needed standing up from a chair using your arms (e.g., wheelchair or bedside chair)?: A Little Help needed to walk in hospital room?: A Little Help needed climbing 3-5 steps with a railing? : Total 6 Click Score: 16    End of Session Equipment Utilized During Treatment: Gait belt Activity Tolerance: Patient limited by pain Patient left: in bed;with call bell/phone within reach;with family/visitor present Nurse Communication: Mobility status PT Visit Diagnosis: Other abnormalities of gait and mobility (R26.89)     Time: 8551-8486 PT Time Calculation (min) (ACUTE ONLY): 25 min  Charges:    $Gait Training: 8-22 mins $Therapeutic Activity: 8-22 mins PT General Charges $$ ACUTE PT VISIT: 1 Visit                    Darryle George, PTA Acute Rehabilitation Services Secure Chat Preferred  [phone]    Darryle George 11/18/2024, 3:20 PM

## 2024-11-18 NOTE — TOC Progression Note (Signed)
 Transition of Care [facility]) - Progression Note    Patient Details  Name: AKEYLAH HENDEL MRN: [id number] Date of Birth: [birth year]  [facility] CM/SW Contact  Sherline Clack, [place] Phone Number: 11/18/2024, 4:38 PM  Clinical Narrative:     CSW reviewed bed offers with patient at bedside. Patient would like to accept [facility] for SNF. CSW will reach out to [facility] for bed availability and will reach out to [facility] waiver admin to see if patient is a part of the program.  Expected Discharge Plan: Skilled Nursing Facility Barriers to Discharge: Insurance Authorization, SNF Pending Bed Offer                    Expected Discharge Plan and Services         Expected Discharge Date: 11/17/24                                     Social Drivers of Health (SDOH) Interventions SDOH Screenings   Food Insecurity: No Food Insecurity (11/17/2024)  Housing: Low Risk (11/17/2024)  Transportation Needs: No Transportation Needs (11/17/2024)  Utilities: Not At Risk (11/17/2024)  Alcohol Screen: Low Risk (05/05/2024)  Depression (PHQ2-9): Low Risk (05/19/2024)  Recent Concern: Depression (PHQ2-9) - High Risk (05/05/2024)  Financial Resource Strain: Low Risk (05/05/2024)  Physical Activity: Sufficiently Active (05/05/2024)  Social Connections: Socially Isolated (11/17/2024)  Stress: Stress Concern Present (05/05/2024)  Tobacco Use: Low Risk (11/16/2024)  Health Literacy: Adequate Health Literacy (05/05/2024)    Readmission Risk Interventions     No data to display

## 2024-11-19 ENCOUNTER — Ambulatory Visit

## 2024-11-19 DIAGNOSIS — M5416 Radiculopathy, lumbar region: Secondary | ICD-10-CM | POA: Diagnosis present

## 2024-11-19 LAB — BASIC METABOLIC PANEL WITH GFR
Anion gap: 8 (ref 5–15)
BUN: 25 mg/dL — ABNORMAL HIGH (ref 8–23)
CO2: 27 mmol/L (ref 22–32)
Calcium: 8.4 mg/dL — ABNORMAL LOW (ref 8.9–10.3)
Chloride: 107 mmol/L (ref 98–111)
Creatinine, Ser: 1.11 mg/dL — ABNORMAL HIGH (ref 0.44–1.00)
GFR, Estimated: 54 mL/min — ABNORMAL LOW
Glucose, Bld: 95 mg/dL (ref 70–99)
Potassium: 4.7 mmol/L (ref 3.5–5.1)
Sodium: 142 mmol/L (ref 135–145)

## 2024-11-19 LAB — CUP PACEART REMOTE DEVICE CHECK
Date Time Interrogation Session: 20260204230540
Implantable Pulse Generator Implant Date: 20220728

## 2024-11-19 LAB — SURGICAL PCR SCREEN
MRSA, PCR: NEGATIVE
Staphylococcus aureus: NEGATIVE

## 2024-11-19 MED ORDER — FLEET ENEMA RE ENEM
1.0000 | ENEMA | Freq: Once | RECTAL | Status: DC
  Filled 2024-11-19: qty 1

## 2024-11-19 MED ORDER — CEFAZOLIN SODIUM-DEXTROSE 1-4 GM/50ML-% IV SOLN
1.0000 g | Freq: Once | INTRAVENOUS | Status: DC
  Filled 2024-11-19: qty 50

## 2024-11-19 MED ORDER — FLEET ENEMA RE ENEM
1.0000 | ENEMA | Freq: Once | RECTAL | Status: AC
  Administered 2024-11-19: 1 via RECTAL

## 2024-11-19 NOTE — Plan of Care (Signed)
  Problem: Education: Goal: Knowledge of General Education information will improve Description: Including pain rating scale, medication(s)/side effects and non-pharmacologic comfort measures Outcome: Progressing   Problem: Health Behavior/Discharge Planning: Goal: Ability to manage health-related needs will improve Outcome: Progressing   Problem: Clinical Measurements: Goal: Ability to maintain clinical measurements within normal limits will improve Outcome: Progressing   Problem: Activity: Goal: Risk for activity intolerance will decrease Outcome: Progressing   Problem: Coping: Goal: Level of anxiety will decrease Outcome: Progressing   Problem: Elimination: Goal: Will not experience complications related to bowel motility Outcome: Progressing   Problem: Pain Managment: Goal: General experience of comfort will improve and/or be controlled Outcome: Progressing

## 2024-11-19 NOTE — Progress Notes (Signed)
" ° ° °  Subjective: Patient reports pain as 7 on 0-10 scale.  Reports increased leg pain reports significant back pain   Positive void Negative bowel movement Positive flatus Negative chest pain or shortness of breath  Objective: Vital signs in last 24 hours: Temp:  [97.5 F (36.4 C)-98.1 F (36.7 C)] 97.5 F (36.4 C) (02/04 2013) Pulse Rate:  [94-95] 94 (02/04 1612) Resp:  [17-20] 18 (02/04 2013) BP: (137-149)/(64-69) 149/69 (02/04 2013) SpO2:  [98 %-100 %] 98 % (02/04 2013)  Intake/Output from previous day: 02/04 0701 - 02/05 0700 In: 3 [I.V.:3] Out: -   Labs: Recent Labs    11/17/24 0426 11/18/24 0352  WBC 5.5 12.9*  RBC 3.79* 3.61*  HCT 35.5* 33.9*  PLT 455* 441*   Recent Labs    11/18/24 0352 11/19/24 0740  NA 141 142  K 4.9 4.7  CL 107 107  CO2 24 27  BUN 26* 25*  CREATININE 1.08* 1.11*  GLUCOSE 150* 95  CALCIUM  8.8* 8.4*   No results for input(s): LABPT, INR in the last 72 hours.  Physical Exam: Intact pulses distally Compartment soft Positive right femoral stretch test. Persistent weakness of the right quad and tibialis anterior (4/5) Body mass index is 27.88 kg/m.   Assessment/Plan: Patient stable  Unfortunately she continues to have severe radiculopathy and weakness related to the recurrent L4-5 disc herniation.  The disc herniation is producing severe foraminal and lateral recess stenosis which is affecting both the exiting L4 and L5 nerve root.  She has trace weakness in these dermatomes as well as a positive nerve root tension sign. At this point since she is going to have surgical intervention I am changing her from an observation to inpatient status. We will change her DVT prophylaxis from Lovenox  to teds and SCDs. Patient reports constipation and so we will give her a fleets enema today. Plan on surgical intervention tomorrow afternoon. Surgical plan: TLIF L4-5. I have again gone over the risks benefits and alternatives to surgery  and she is expressed an understanding of the surgery as well as the goals.  Donaciano Sprang, MD [facility] [phone]  "

## 2024-11-19 NOTE — Progress Notes (Signed)
 " PROGRESS NOTE    Debbie Hatfield  FMW:[id number] DOB: 31-Oct-1955 DOA: 11/16/2024 PCP: Joshua Debby CROME, MD   Brief Narrative: 69 year old with past medical history significant for systemic lupus, hypertension, osteoarthritis, CKD stage IIIa, IBS, MDD, GAD who presents to the ED 09/16/19/2026 due to back pain.  Patient reports excruciating back pain that started 2 days prior after she was shoveling snow at home.  She had undergone right L4-L5 microdissecting approximately 3 months ago with complete resolution of her sciatic symptoms.  MRI lumbar spine showed right foraminal and extraforaminal disc protrusion at L4-L5 with moderate to severe right neuroforaminal stenosis.  Moderate right and minimal left lateral recess stenosis and mild right and moderate left neural foraminal stenosis L3-L4.  Mild to moderate left lateral recess and left neural foraminal stenosis L2-L3. Initial recommendation from orthopedic was prednisone  taper and follow-up as an outpatient but patient has uncontrolled pain and inability to ambulate and require rehab.  Ortho reconsulted and plan is for surgery on Friday   Assessment & Plan:   Principal Problem:   Protrusion of lumbar intervertebral disc Active Problems:   Irritable bowel disease   Hypertension   Gastroparesis   CKD stage 3a, GFR 45-59 ml/min (HCC)   History of CVA (cerebrovascular accident)   History of musculoskeletal disease   Spinal stenosis at L4-L5 level   History of depression   History of sinus bradycardia   History of systemic lupus erythematosus (SLE) (HCC)   Osteoarthritis of right hip   Lumbar radiculopathy, acute   1-Lumbar intervertebral disc protrusion with L4 radiculopathy Patient with inability to ambulate due to pain and weakness , plan is to proceed with Sx on Friday.  MRI lumbar spine:showed right foraminal and extraforaminal disc protrusion L4-L5 with moderate to severe right neural foraminal stenosis.  On prednisone , no significant  improvement. She has been requiring IV Dilaudid   CKD 3 A Continue to monitor renal function  History of CVA Holding Plavix  for surgical procedure on Friday  Hypertension Continue amlodipine  and Avapro   Depression Continue Cymbalta  and Seroquel   SLE Needs follow-up with rheumatology as an outpatient   Estimated body mass index is 27.88 kg/m as calculated from the following:   Height as of this encounter: 5' 2 (1.575 m).   Weight as of this encounter: 69.1 kg.   DVT prophylaxis:  Code Status: Plan to continue with full code, patient does not wish to be on life support for a long.  Of time Family Communication: Discussed with patient Disposition Plan:  Status is: Observation The patient remains OBS appropriate and will d/c before 2 midnights.    Consultants:  Ortho  Procedures:  Back surgery  Antimicrobials:    Subjective: She has not had a bowel movement, she still having severe pain  Objective: Vitals:   11/18/24 1612 11/18/24 2013 11/19/24 0835 11/19/24 1201  BP: (!) 140/68 (!) 149/69 (!) 158/73 (!) 127/56  Pulse: 94  87 81  Resp: 20 18 20 20   Temp: 98.1 F (36.7 C) (!) 97.5 F (36.4 C) 98 F (36.7 C) 97.9 F (36.6 C)  TempSrc:      SpO2: 98% 98% 100% 97%  Weight:      Height:        Intake/Output Summary (Last 24 hours) at 11/19/2024 1300 Last data filed at 11/18/2024 2249 Gross per 24 hour  Intake 3 ml  Output --  Net 3 ml   Filed Weights   11/16/24 1436  Weight: 69.1 kg  Examination:  General exam: No acute distress Respiratory system; CTA Cardiovascular system: S 1, S 2 RRR Gastrointestinal system: BS present, soft, nt Central nervous system: Alert Extremities: right LE weakness   Data Reviewed: I have personally reviewed following labs and imaging studies  CBC: Recent Labs  Lab 11/16/24 2010 11/17/24 0426 11/18/24 0352  WBC 7.8 5.5 12.9*  HGB 11.9* 11.3* 10.7*  HCT 37.0 35.5* 33.9*  MCV 92.3 93.7 93.9  PLT 477*  455* 441*   Basic Metabolic Panel: Recent Labs  Lab 11/16/24 2010 11/17/24 0426 11/18/24 0352 11/19/24 0740  NA 140 140 141 142  K 4.8 5.0 4.9 4.7  CL 109 108 107 107  CO2 20* 22 24 27   GLUCOSE 182* 167* 150* 95  BUN 25* 28* 26* 25*  CREATININE 1.27* 1.20* 1.08* 1.11*  CALCIUM  8.9 8.8* 8.8* 8.4*   GFR: Estimated Creatinine Clearance: 44.2 mL/min (A) (by C-G formula based on SCr of 1.11 mg/dL (H)). Liver Function Tests: No results for input(s): AST, ALT, ALKPHOS, BILITOT, PROT, ALBUMIN  in the last 168 hours. No results for input(s): LIPASE, AMYLASE in the last 168 hours. No results for input(s): AMMONIA in the last 168 hours. Coagulation Profile: No results for input(s): INR, PROTIME in the last 168 hours. Cardiac Enzymes: No results for input(s): CKTOTAL, CKMB, CKMBINDEX, TROPONINI in the last 168 hours. BNP (last 3 results) No results for input(s): PROBNP in the last 8760 hours. HbA1C: No results for input(s): HGBA1C in the last 72 hours. CBG: No results for input(s): GLUCAP in the last 168 hours. Lipid Profile: No results for input(s): CHOL, HDL, LDLCALC, TRIG, CHOLHDL, LDLDIRECT in the last 72 hours. Thyroid  Function Tests: No results for input(s): TSH, T4TOTAL, FREET4, T3FREE, THYROIDAB in the last 72 hours. Anemia Panel: No results for input(s): VITAMINB12, FOLATE, FERRITIN, TIBC, IRON, RETICCTPCT in the last 72 hours. Sepsis Labs: No results for input(s): PROCALCITON, LATICACIDVEN in the last 168 hours.  No results found for this or any previous visit (from the past 240 hours).       Radiology Studies: CUP PACEART REMOTE DEVICE CHECK Result Date: 11/19/2024 ILR summary report received. Battery status OK. Normal device function. No new symptom, tachy, brady, or pause episodes. AT/AF episode per trends below detection. Monthly summary reports and ROV/PRN.  Presenting rhythm ST ~125  bpm LA CVRS  DG Lumbar Spine 2-3 Views Result Date: 11/17/2024 CLINICAL DATA:  Status post lumbar surgery. EXAM: LUMBAR SPINE - 2-3 VIEW COMPARISON:  Lumbar MRI yesterday FINDINGS: Five non-rib-bearing lumbar vertebra. Broad-based dextroscoliotic curvature. Trace anterolisthesis of L3 on L4. Vertebral body heights are normal, no evidence of fracture or focal bone abnormality. Multilevel disc space narrowing and spurring, greatest at L3-L4. Multilevel facet hypertrophy. The right L4-L5 laminectomy on MRI is not well delineated by radiograph. IMPRESSION: 1. Multilevel degenerative disc disease and facet hypertrophy in the lumbar spine. 2. Trace anterolisthesis of L3 on L4. Broad-based dextroscoliotic curvature. Electronically Signed   By: Andrea Gasman M.D.   On: 11/17/2024 21:22        Scheduled Meds:  amLODipine   5 mg Oral Daily   doxepin   75 mg Oral QHS   DULoxetine   60 mg Oral QHS   DULoxetine   90 mg Oral Daily   gabapentin   600 mg Oral BID   irbesartan   75 mg Oral Daily   pantoprazole   40 mg Oral BID AC   polyethylene glycol  17 g Oral Daily   senna-docusate  1 tablet Oral BID  sodium chloride  flush  3 mL Intravenous Q12H   sodium chloride  flush  3 mL Intravenous Q12H   Continuous Infusions:  [START ON 11/20/2024]  ceFAZolin  (ANCEF ) IV       LOS: 0 days    Time spent: 35 Minutes    Jacob Cicero A Damyen Knoll, MD [facility]   If 7PM-7AM, please contact night-coverage [email or web address]  11/19/2024, 1:00 PM   "

## 2024-11-19 NOTE — Progress Notes (Signed)
 Occupational Therapy Treatment Patient Details Name: Debbie Hatfield MRN: [id number] DOB: [birth year] Today's Date: 11/19/2024   History of present illness Debbie Hatfield is a 69 yo female who presented with excruciating back pain. MRI demonstrates right foraminal and extraforaminal disc protrusion at L4-L5 with moderate to severe right neural foraminal stenosis. Pt with a past medical hisotry of 08/2024 back sx, undifferentiated connective tissue disease, HTN, trigeminal neuralgia (left), and IBS   OT comments  OT session focused on training in techniques for increased safety and independence with ADLs and functional mobility during/in preparation for ADLs. Pt currently demonstrating ability to complete ADLs largely Independent to Mod assist, bed mobility with Supervision, and functional transfers/mobility with RW with Contact guard to Min assist. Pt VSS on RA. Pt limited by pain, but highly motivated and participated well throughout session. Pt is making progress toward OT goals. Acute OT to continue to follow. Post acute discharge, pt will benefit from intensive inpatient skilled rehab services < 3 hours per day to maximize rehab potential.       If plan is discharge home, recommend the following:  A little help with walking and/or transfers;A lot of help with bathing/dressing/bathroom;Assistance with cooking/housework;Assist for transportation;Help with stairs or ramp for entrance   Equipment Recommendations  Other (comment) (TBD based on pt progress)    Recommendations for Other Services      Precautions / Restrictions Precautions Precautions: Fall Recall of Precautions/Restrictions: Intact Restrictions Weight Bearing Restrictions Per Provider Order: No       Mobility Bed Mobility Overal bed mobility: Needs Assistance Bed Mobility: Supine to Sit     Supine to sit: Supervision, HOB elevated, Used rails     General bed mobility comments: increased time and use of L LE to manage R  LE    Transfers Overall transfer level: Needs assistance Equipment used: Rolling walker (2 wheels), None Transfers: Sit to/from Stand, Bed to chair/wheelchair/BSC Sit to Stand: Contact guard assist Stand pivot transfers: Contact guard assist (without an AD from bed to/from [facility])   Step pivot transfers: Contact guard assist (with RW)           Balance Overall balance assessment: Needs assistance Sitting-balance support: Single extremity supported, Bilateral upper extremity supported, No upper extremity supported, Feet supported Sitting balance-Leahy Scale: Fair Sitting balance - Comments: Mild posterior lean and tendency to support self with B UE to offload L buttocks/LE due to pain, but with pt demonstrating ability to maintain static and dynamic sitting balance during ADLs with Supervision   Standing balance support: Single extremity supported, Bilateral upper extremity supported, During functional activity, Reliant on assistive device for balance Standing balance-Leahy Scale: Poor Standing balance comment: reliant on unilateral UE support standing at sink for grooming tasks; requiring B UE support for transfers/ambulation                           ADL either performed or assessed with clinical judgement   ADL Overall ADL's : Needs assistance/impaired     Grooming: Contact guard assist;Minimal assistance;Brushing hair;Standing;Cueing for compensatory techniques (applying deodorant) Grooming Details (indicate cue type and reason): CGA for brusing hair and Min assist for applying deodorant         Upper Body Dressing : Set up;Sitting   Lower Body Dressing: Moderate assistance;Contact guard assist;Cueing for compensatory techniques;Sit to/from stand;Sitting/lateral leans Lower Body Dressing Details (indicate cue type and reason): Mod assist in sitting for socks and threading feet in pants;  CGA to bring pants over hips in standing Toilet Transfer: Contact guard  assist;Stand-pivot;BSC/3in1 (cues for hand placement/technique)   Toileting- Clothing Manipulation and Hygiene: Set up;Sitting/lateral lean;Contact guard assist;Sit to/from stand Toileting - Clothing Manipulation Details (indicate cue type and reason): Set up for toileting hygiene in sitting; CGA for clothing management in standing     Functional mobility during ADLs: Minimal assistance;Rolling walker (2 wheels) (with pt self-limiting WB through R LE and largely completing mobility with TTWB on R LE; cues for technique; ambulated from bed to door of room and back) General ADL Comments: Pt limited by pain, but highly motivated and participating well in session    Extremity/Trunk Assessment Upper Extremity Assessment Upper Extremity Assessment: Overall WFL for tasks assessed   Lower Extremity Assessment Lower Extremity Assessment: Defer to PT evaluation        Vision   Vision Assessment?: No apparent visual deficits   Perception     Praxis     Communication Communication Communication: No apparent difficulties   Cognition Arousal: Alert Behavior During Therapy: WFL for tasks assessed/performed Cognition: No apparent impairments             OT - Cognition Comments: AAOx4 and pleasant throughout session; retired [employer], great knowledge of medical complexity                 Following commands: Intact        Cueing   Cueing Techniques: Verbal cues  Exercises      Shoulder Instructions       General Comments VSS on RA; RN arriving at end of session to provide enema    Pertinent Vitals/ Pain       Pain Assessment Pain Assessment: 0-10 Pain Score: 8  Pain Location: R buttock and RLE Pain Descriptors / Indicators: Sharp, Shooting, Spasm Pain Intervention(s): Limited activity within patient's tolerance, Monitored during session, Premedicated before session, Repositioned  Home Living                                          Prior  Functioning/Environment              Frequency  Min 2X/week        Progress Toward Goals  OT Goals(current goals can now be found in the care plan section)  Progress towards OT goals: Progressing toward goals  Acute Rehab OT Goals Patient Stated Goal: to have less pain and be independent again so that she can continue to care for her 75 yo mother  Plan      Co-evaluation                 AM-PAC OT 6 Clicks Daily Activity     Outcome Measure   Help from another person eating meals?: None Help from another person taking care of personal grooming?: A Little Help from another person toileting, which includes using toliet, bedpan, or urinal?: A Little Help from another person bathing (including washing, rinsing, drying)?: A Lot Help from another person to put on and taking off regular upper body clothing?: A Little Help from another person to put on and taking off regular lower body clothing?: A Lot 6 Click Score: 17    End of Session Equipment Utilized During Treatment: Gait belt;Rolling walker (2 wheels);Other (comment) (BSC)  OT Visit Diagnosis: Other abnormalities of gait and mobility (R26.89);Pain   Activity Tolerance Patient  tolerated treatment well;Patient limited by pain   Patient Left in bed;with call bell/phone within reach;with nursing/sitter in room (sitting EOB with RN present and assisting pt)   Nurse Communication Mobility status;Other (comment) (Pt feels she needs another enema. OT lowered BSC and RW for increased pt safety and comfort.)        Time: 8965-8950 OT Time Calculation (min): 15 min  Charges: OT General Charges $OT Visit: 1 Visit OT Treatments $Self Care/Home Management : 8-22 mins  Margarie Rockey HERO., OTR/L, MA Acute Rehab [phone]   Margarie FORBES Horns 11/19/2024, 11:10 AM

## 2024-11-19 NOTE — Progress Notes (Addendum)
" ° ° °  PROCEDURAL EXPEDITER PROGRESS NOTE  Patient Name: Debbie Hatfield  DOB:May 28, 1956 Date of Admission: 11/16/2024  Date of Assessment:11/19/24   -------------------------------------------------------------------------------------------------------------------   Brief clinical summary: patient going to OR tomorrow for TLIF L4-L5  Orders in place:   Yes   Communication with surgical team if no orders: none  Labs, test, and orders reviewed: yes  Requires surgical clearance:   No  What type of clearance: n/a  Clearance received: n/a  Barriers noted: need PCR   Intervention provided by [facility] team: PCR order placed  Barrier resolved:   yes   -------------------------------------------------------------------------------------------------------------------  [facility] Patient Care Command Expediter, Rexene LITTIE Kirks Please contact us  directly via secure chat (search for [facility]) or by calling us  at [phone] [facility]).  "

## 2024-11-19 NOTE — Plan of Care (Signed)
  Problem: Clinical Measurements: Goal: Ability to maintain clinical measurements within normal limits will improve Outcome: Progressing Goal: Will remain free from infection Outcome: Progressing Goal: Diagnostic test results will improve Outcome: Progressing Goal: Respiratory complications will improve Outcome: Progressing Goal: Cardiovascular complication will be avoided Outcome: Progressing   Problem: Activity: Goal: Risk for activity intolerance will decrease Outcome: Progressing   Problem: Nutrition: Goal: Adequate nutrition will be maintained Outcome: Progressing   Problem: Elimination: Goal: Will not experience complications related to bowel motility Outcome: Progressing Goal: Will not experience complications related to urinary retention Outcome: Progressing

## 2024-11-20 ENCOUNTER — Inpatient Hospital Stay (HOSPITAL_COMMUNITY)

## 2024-11-20 ENCOUNTER — Encounter (HOSPITAL_COMMUNITY): Payer: Self-pay | Admitting: Anesthesiology

## 2024-11-20 ENCOUNTER — Other Ambulatory Visit: Payer: Self-pay

## 2024-11-20 ENCOUNTER — Encounter (HOSPITAL_COMMUNITY): Admission: EM | Disposition: A | Payer: Self-pay | Source: Home / Self Care | Attending: Internal Medicine

## 2024-11-20 ENCOUNTER — Encounter (HOSPITAL_COMMUNITY): Payer: Self-pay | Admitting: Orthopedic Surgery

## 2024-11-20 DIAGNOSIS — Z981 Arthrodesis status: Secondary | ICD-10-CM

## 2024-11-20 LAB — POCT I-STAT EG7
Acid-Base Excess: 1 mmol/L (ref 0.0–2.0)
Bicarbonate: 25.7 mmol/L (ref 20.0–28.0)
Calcium, Ion: 1.13 mmol/L — ABNORMAL LOW (ref 1.15–1.40)
HCT: 27 % — ABNORMAL LOW (ref 36.0–46.0)
Hemoglobin: 9.2 g/dL — ABNORMAL LOW (ref 12.0–15.0)
O2 Saturation: 98 %
Patient temperature: 37.1
Potassium: 4.3 mmol/L (ref 3.5–5.1)
Sodium: 141 mmol/L (ref 135–145)
TCO2: 27 mmol/L (ref 22–32)
pCO2, Ven: 40.5 mmHg — ABNORMAL LOW (ref 44–60)
pH, Ven: 7.411 (ref 7.25–7.43)
pO2, Ven: 109 mmHg — ABNORMAL HIGH (ref 32–45)

## 2024-11-20 LAB — TYPE AND SCREEN
ABO/RH(D): A POS
Antibody Screen: NEGATIVE

## 2024-11-20 LAB — ABO/RH: ABO/RH(D): A POS

## 2024-11-20 MED ORDER — MIDAZOLAM HCL 2 MG/2ML IJ SOLN
INTRAMUSCULAR | Status: AC
  Filled 2024-11-20: qty 2

## 2024-11-20 MED ORDER — METHYLPREDNISOLONE ACETATE 40 MG/ML IJ SUSP
INTRAMUSCULAR | Status: AC
  Filled 2024-11-20: qty 1

## 2024-11-20 MED ORDER — HYDROMORPHONE HCL 1 MG/ML IJ SOLN
INTRAMUSCULAR | Status: AC
  Filled 2024-11-20: qty 0.5

## 2024-11-20 MED ORDER — BUPIVACAINE-EPINEPHRINE 0.25% -1:200000 IJ SOLN
INTRAMUSCULAR | Status: DC | PRN
  Administered 2024-11-20: 10 mL

## 2024-11-20 MED ORDER — FENTANYL CITRATE (PF) 250 MCG/5ML IJ SOLN
INTRAMUSCULAR | Status: AC
  Filled 2024-11-20: qty 5

## 2024-11-20 MED ORDER — BUPIVACAINE-EPINEPHRINE (PF) 0.25% -1:200000 IJ SOLN
INTRAMUSCULAR | Status: AC
  Filled 2024-11-20: qty 30

## 2024-11-20 MED ORDER — OXYCODONE HCL 5 MG/5ML PO SOLN
ORAL | Status: AC
  Filled 2024-11-20: qty 5

## 2024-11-20 MED ORDER — PROPOFOL 10 MG/ML IV BOLUS
INTRAVENOUS | Status: DC | PRN
  Administered 2024-11-20: 50 mg via INTRAVENOUS
  Administered 2024-11-20: 30 mg via INTRAVENOUS
  Administered 2024-11-20: 150 mg via INTRAVENOUS
  Administered 2024-11-20: 30 mg via INTRAVENOUS
  Administered 2024-11-20 (×2): 50 mg via INTRAVENOUS

## 2024-11-20 MED ORDER — FENTANYL CITRATE (PF) 250 MCG/5ML IJ SOLN
INTRAMUSCULAR | Status: DC | PRN
  Administered 2024-11-20 (×3): 50 ug via INTRAVENOUS
  Administered 2024-11-20: 100 ug via INTRAVENOUS

## 2024-11-20 MED ORDER — 0.9 % SODIUM CHLORIDE (POUR BTL) OPTIME
TOPICAL | Status: DC | PRN
  Administered 2024-11-20 (×2): 1000 mL

## 2024-11-20 MED ORDER — SODIUM ZIRCONIUM CYCLOSILICATE 10 G PO PACK: 10.0000 g | PACK | ORAL | Status: DC

## 2024-11-20 MED ORDER — HYDROMORPHONE HCL 1 MG/ML IJ SOLN
0.2500 mg | INTRAMUSCULAR | Status: DC | PRN
  Administered 2024-11-20 (×2): 0.5 mg via INTRAVENOUS

## 2024-11-20 MED ORDER — SODIUM CHLORIDE 0.9 % IV SOLN: 250.0000 mL | INTRAVENOUS | Status: AC

## 2024-11-20 MED ORDER — ORAL CARE MOUTH RINSE: 15.0000 mL | Freq: Once | OROMUCOSAL | Status: AC

## 2024-11-20 MED ORDER — METHYLPREDNISOLONE ACETATE 40 MG/ML IJ SUSP
INTRAMUSCULAR | Status: DC | PRN
  Administered 2024-11-20: 40 mg

## 2024-11-20 MED ORDER — SODIUM CHLORIDE 0.9% FLUSH
3.0000 mL | Freq: Two times a day (BID) | INTRAVENOUS | Status: AC
  Administered 2024-11-20: 3 mL via INTRAVENOUS

## 2024-11-20 MED ORDER — OXYCODONE HCL 5 MG PO TABS: 5.0000 mg | ORAL_TABLET | Freq: Once | ORAL | Status: AC | PRN

## 2024-11-20 MED ORDER — CHLORHEXIDINE GLUCONATE 0.12 % MT SOLN
OROMUCOSAL | Status: AC
  Administered 2024-11-20: 15 mL via OROMUCOSAL
  Filled 2024-11-20: qty 15

## 2024-11-20 MED ORDER — IRBESARTAN 150 MG PO TABS: 150.0000 mg | ORAL_TABLET | Freq: Every day | ORAL | Status: AC

## 2024-11-20 MED ORDER — SODIUM CHLORIDE 0.9% FLUSH: 3.0000 mL | INTRAVENOUS | Status: AC | PRN

## 2024-11-20 MED ORDER — HYDROMORPHONE HCL 1 MG/ML IJ SOLN
INTRAMUSCULAR | Status: DC | PRN
  Administered 2024-11-20 (×2): .5 mg via INTRAVENOUS

## 2024-11-20 MED ORDER — CHLORHEXIDINE GLUCONATE 0.12 % MT SOLN: 15.0000 mL | Freq: Once | OROMUCOSAL | Status: AC

## 2024-11-20 MED ORDER — SUCCINYLCHOLINE CHLORIDE 200 MG/10ML IV SOSY
PREFILLED_SYRINGE | INTRAVENOUS | Status: DC | PRN
  Administered 2024-11-20: 100 mg via INTRAVENOUS

## 2024-11-20 MED ORDER — THROMBIN (RECOMBINANT) 20000 UNITS EX SOLR
CUTANEOUS | Status: AC
  Filled 2024-11-20: qty 20000

## 2024-11-20 MED ORDER — TRANEXAMIC ACID-NACL 1000-0.7 MG/100ML-% IV SOLN
INTRAVENOUS | Status: DC | PRN
  Administered 2024-11-20: 1000 mg via INTRAVENOUS

## 2024-11-20 MED ORDER — ALBUMIN HUMAN 5 % IV SOLN: INTRAVENOUS | Status: DC | PRN

## 2024-11-20 MED ORDER — AMISULPRIDE (ANTIEMETIC) 5 MG/2ML IV SOLN: 10.0000 mg | Freq: Once | INTRAVENOUS | Status: DC | PRN

## 2024-11-20 MED ORDER — CEFAZOLIN SODIUM-DEXTROSE 2-4 GM/100ML-% IV SOLN
2.0000 g | INTRAVENOUS | Status: AC
  Administered 2024-11-20: 2 g via INTRAVENOUS

## 2024-11-20 MED ORDER — LACTATED RINGERS IV SOLN: INTRAVENOUS | Status: DC | PRN

## 2024-11-20 MED ORDER — HYDROMORPHONE HCL 1 MG/ML IJ SOLN
INTRAMUSCULAR | Status: AC
  Filled 2024-11-20: qty 1

## 2024-11-20 MED ORDER — PHENYLEPHRINE 80 MCG/ML (10ML) SYRINGE FOR IV PUSH (FOR BLOOD PRESSURE SUPPORT)
PREFILLED_SYRINGE | INTRAVENOUS | Status: DC | PRN
  Administered 2024-11-20: 160 ug via INTRAVENOUS
  Administered 2024-11-20: 240 ug via INTRAVENOUS
  Administered 2024-11-20: 160 ug via INTRAVENOUS
  Administered 2024-11-20: 240 ug via INTRAVENOUS

## 2024-11-20 MED ORDER — SODIUM CHLORIDE 0.9 % IV SOLN
0.0125 ug/kg/min | INTRAVENOUS | Status: DC
  Filled 2024-11-20: qty 2000

## 2024-11-20 MED ORDER — MENTHOL 3 MG MT LOZG: 1.0000 | LOZENGE | OROMUCOSAL | Status: AC | PRN

## 2024-11-20 MED ORDER — DEXAMETHASONE SOD PHOSPHATE PF 10 MG/ML IJ SOLN
INTRAMUSCULAR | Status: DC | PRN
  Administered 2024-11-20: 10 mg via INTRAVENOUS

## 2024-11-20 MED ORDER — PHENYLEPHRINE HCL-NACL 20-0.9 MG/250ML-% IV SOLN
INTRAVENOUS | Status: DC | PRN
  Administered 2024-11-20: 30 ug/min via INTRAVENOUS

## 2024-11-20 MED ORDER — EPHEDRINE SULFATE-NACL 50-0.9 MG/10ML-% IV SOSY
PREFILLED_SYRINGE | INTRAVENOUS | Status: DC | PRN
  Administered 2024-11-20: 10 mg via INTRAVENOUS
  Administered 2024-11-20 (×2): 5 mg via INTRAVENOUS

## 2024-11-20 MED ORDER — MIDAZOLAM HCL (PF) 2 MG/2ML IJ SOLN
INTRAMUSCULAR | Status: DC | PRN
  Administered 2024-11-20: 2 mg via INTRAVENOUS

## 2024-11-20 MED ORDER — SODIUM CHLORIDE 0.9 % IV SOLN
0.1500 ug/kg/min | INTRAVENOUS | Status: AC
  Administered 2024-11-20: .1 ug/kg/min via INTRAVENOUS
  Administered 2024-11-20: .2 ug/kg/min via INTRAVENOUS
  Filled 2024-11-20: qty 2000

## 2024-11-20 MED ORDER — LACTATED RINGERS IV SOLN: INTRAVENOUS | Status: DC

## 2024-11-20 MED ORDER — PHENOL 1.4 % MT LIQD: 1.0000 | OROMUCOSAL | Status: AC | PRN

## 2024-11-20 MED ORDER — SURGIFLO WITH THROMBIN (HEMOSTATIC MATRIX KIT) OPTIME
TOPICAL | Status: DC | PRN
  Administered 2024-11-20: 1

## 2024-11-20 MED ORDER — CEFAZOLIN SODIUM-DEXTROSE 1-4 GM/50ML-% IV SOLN
1.0000 g | Freq: Three times a day (TID) | INTRAVENOUS | Status: AC
  Administered 2024-11-20: 1 g via INTRAVENOUS
  Filled 2024-11-20 (×2): qty 50

## 2024-11-20 MED ORDER — THROMBIN 20000 UNITS EX SOLR
CUTANEOUS | Status: DC | PRN
  Administered 2024-11-20: 20 mL

## 2024-11-20 MED ORDER — OXYCODONE HCL 5 MG/5ML PO SOLN
5.0000 mg | Freq: Once | ORAL | Status: AC | PRN
  Administered 2024-11-20: 5 mg via ORAL

## 2024-11-20 MED ORDER — LACTATED RINGERS IV SOLN: INTRAVENOUS | Status: AC

## 2024-11-20 MED ORDER — DOCUSATE SODIUM 100 MG PO CAPS: 100.0000 mg | ORAL_CAPSULE | ORAL | Status: AC

## 2024-11-20 MED ORDER — TRANEXAMIC ACID-NACL 1000-0.7 MG/100ML-% IV SOLN
INTRAVENOUS | Status: AC
  Filled 2024-11-20: qty 100

## 2024-11-20 MED ORDER — PROPOFOL 10 MG/ML IV BOLUS
INTRAVENOUS | Status: AC
  Filled 2024-11-20: qty 20

## 2024-11-20 MED ORDER — PROPOFOL 500 MG/50ML IV EMUL
INTRAVENOUS | Status: DC | PRN
  Administered 2024-11-20: 100 ug/kg/min via INTRAVENOUS
  Administered 2024-11-20: 75 ug/kg/min via INTRAVENOUS

## 2024-11-20 MED ORDER — VITAMIN D 25 MCG (1000 UNIT) PO TABS: 5000.0000 [IU] | ORAL_TABLET | Freq: Every day | ORAL | Status: AC

## 2024-11-20 NOTE — Anesthesia Postprocedure Evaluation (Signed)
"   Anesthesia Post Note  Patient: Debbie Hatfield  Procedure(s) Performed: TRANSFORAMINAL LUMBAR INTERBODY FUSION (TLIF) WITH PEDICLE SCREW FIXATION 1 LEVEL     Patient location during evaluation: PACU Anesthesia Type: General Level of consciousness: awake and alert Pain management: pain level controlled Vital Signs Assessment: post-procedure vital signs reviewed and stable Respiratory status: spontaneous breathing, nonlabored ventilation, respiratory function stable and patient connected to nasal cannula oxygen Cardiovascular status: blood pressure returned to baseline and stable Postop Assessment: no apparent nausea or vomiting Anesthetic complications: no   No notable events documented.  Last Vitals:  Vitals:   11/20/24 2030 11/20/24 2050  BP: (!) 150/134 (!) 135/53  Pulse: (!) 118 (!) 106  Resp: 20 19  Temp:  36.7 C  SpO2: 96% 99%    Last Pain:  Vitals:   11/20/24 2050  TempSrc: Oral  PainSc:                  [name],[name]      "

## 2024-11-20 NOTE — Progress Notes (Signed)
" ° ° °  PROCEDURAL EXPEDITER PROGRESS NOTE  Patient Name: Debbie Hatfield  DOB:[birth year] Date of Admission: 11/16/2024  Date of Assessment:11/20/24   -------------------------------------------------------------------------------------------------------------------   Brief clinical summary: Pt to OR today for transforaminal lumbar interbody fusion lumbar four to five  Intervention provided by [facility] team: Reached out to the bedside nurse to ensure the patient is ready for surgery. Bedside nurse stated she was going into Pt's room to get everything done and the pt had been NPO  Barrier resolved:  yes   -------------------------------------------------------------------------------------------------------------------  [facility] Patient Care Command Expediter, Chon Buhl, [place] Please contact us  directly via secure chat (search for [facility]) or by calling us  at [phone] [facility]).  "

## 2024-11-20 NOTE — Discharge Summary (Incomplete)
 "  Patient ID: Debbie Hatfield MRN: [id number] DOB/AGE: 1956/05/18 69 y.o.  Admit date: 11/16/2024 Discharge date: 11/20/2024  Admission Diagnoses:  Principal Problem:   Protrusion of lumbar intervertebral disc Active Problems:   Irritable bowel disease   Hypertension   Gastroparesis   CKD stage 3a, GFR 45-59 ml/min (HCC)   History of CVA (cerebrovascular accident)   History of musculoskeletal disease   Spinal stenosis at L4-L5 level   History of depression   History of sinus bradycardia   History of systemic lupus erythematosus (SLE) (HCC)   Osteoarthritis of right hip   Lumbar radiculopathy, acute   Discharge Diagnoses:  Principal Problem:   Protrusion of lumbar intervertebral disc Active Problems:   Irritable bowel disease   Hypertension   Gastroparesis   CKD stage 3a, GFR 45-59 ml/min (HCC)   History of CVA (cerebrovascular accident)   History of musculoskeletal disease   Spinal stenosis at L4-L5 level   History of depression   History of sinus bradycardia   History of systemic lupus erythematosus (SLE) (HCC)   Osteoarthritis of right hip   Lumbar radiculopathy, acute  status post Procedures: TRANSFORAMINAL LUMBAR INTERBODY FUSION (TLIF) WITH PEDICLE SCREW FIXATION 1 LEVEL  Past Medical History:  Diagnosis Date   AKI (acute kidney injury) 10/03/2023   Arthritis    Closed fracture of right distal radius    Complication of anesthesia    Degeneration of lumbar intervertebral disc 01/26/2020   Depression with anxiety 10/03/2023   GAD (generalized anxiety disorder) 11/15/2021   Gastroparesis    GERD (gastroesophageal reflux disease)    History of CVA (cerebrovascular accident) 10/03/2023   History of hiatal hernia    Hyperlipidemia LDL goal <100 03/26/2022   The ASCVD Risk score (Arnett DK, et al., 2019) failed to calculate for the following reasons:    The valid HDL cholesterol range is 20 to 100 mg/dL     Hypertension    Hypotension 10/03/2023   Irritable bowel  disease    Lupus    Major depression    Mitral valve prolapse    Morton's neuroma of left foot 08/30/2023   Near syncope 10/03/2023   Need for shingles vaccine 11/15/2021   Need for vaccination 11/15/2021   Nonrheumatic mitral valve regurgitation 05/10/2022   Osteopenia 12/14/2015   Peptic ulcer disease 12/14/2015   PONV (postoperative nausea and vomiting)    Recurrent major depressive disorder, in partial remission 11/15/2021   Sinus bradycardia 10/03/2023   Stage 3a chronic kidney disease (HCC) 11/15/2021   Estimated Creatinine Clearance: 49.7 mL/min (by C-G formula based on SCr of 1.01 mg/dL).      Estimated Creatinine Clearance: 45.9 mL/min (A) (by C-G formula based on SCr of 1.07 mg/dL (H)).      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   Stroke [facility])    Trigeminal neuralgia    Visit for screening mammogram 11/15/2021    Surgeries: Procedures: TRANSFORAMINAL LUMBAR INTERBODY FUSION (TLIF) WITH PEDICLE SCREW FIXATION 1 LEVEL on 11/16/2024 - 11/20/2024   Consultants: Treatment Team:  Burnetta Aures, MD Madelyne Owen LABOR, MD  Discharged Condition: Improved  Hospital Course: Debbie Hatfield is an 69 y.o. female who was admitted 11/16/2024 for operative treatment of Protrusion of lumbar intervertebral disc. Patient failed conservative treatments (please see the history and physical for the specifics) and had severe unremitting pain that affects sleep, daily activities and work/hobbies. After pre-op clearance, the patient was taken to the operating room on 11/16/2024 - 11/20/2024 and underwent  Procedures: TRANSFORAMINAL LUMBAR INTERBODY FUSION (TLIF) WITH PEDICLE SCREW FIXATION 1 LEVEL.    Patient was given perioperative antibiotics:  Anti-infectives (From admission, onward)    Start     Dose/Rate Route Frequency Ordered Stop   11/20/24 1200  ceFAZolin  (ANCEF ) IVPB 1 g/50 mL premix  Status:  Discontinued        1 g 100 mL/hr over 30 Minutes Intravenous  Once 11/19/24 0838 11/20/24 1150    11/20/24 1147  ceFAZolin  (ANCEF ) IVPB 2g/100 mL premix        2 g 200 mL/hr over 30 Minutes Intravenous 30 min pre-op 11/20/24 1147          Patient was given sequential compression devices and early ambulation to prevent DVT.   Patient benefited maximally from hospital stay and there were no complications. At the time of discharge, the patient was urinating/moving their bowels without difficulty, tolerating a regular diet, pain is controlled with oral pain medications and they have been cleared by PT/OT.   Recent vital signs: Patient Vitals for the past 24 hrs:  BP Temp Temp src Pulse Resp SpO2 Height Weight  11/20/24 1154 (!) 172/80 98 F (36.7 C) Oral 89 12 100 % 5' 2 (1.575 m) 68 kg  11/20/24 1108 (!) 142/72 98.1 F (36.7 C) -- 91 -- 100 % -- --  11/20/24 0729 (!) 147/71 97.6 F (36.4 C) -- 89 -- 98 % -- --  11/20/24 0436 (!) 162/86 (!) 97.5 F (36.4 C) -- 95 -- 97 % -- --  11/20/24 0006 137/70 97.9 F (36.6 C) -- 98 -- 96 % -- --  11/19/24 1959 (!) 148/75 98.2 F (36.8 C) -- 100 -- 97 % -- --  11/19/24 1621 (!) 140/65 98.2 F (36.8 C) -- (!) 101 20 97 % -- --     Recent laboratory studies:  Recent Labs    11/18/24 0352 11/19/24 0740  WBC 12.9*  --   HGB 10.7*  --   HCT 33.9*  --   PLT 441*  --   NA 141 142  K 4.9 4.7  CL 107 107  CO2 24 27  BUN 26* 25*  CREATININE 1.08* 1.11*  GLUCOSE 150* 95  CALCIUM  8.8* 8.4*     Discharge Medications:   Allergies as of 11/20/2024       Reactions   Plaquenil  [hydroxychloroquine ] Other (See Comments)   Retinal damage   Penicillins Rash   Sulfa Antibiotics Nausea And Vomiting   Sulfamethoxazole-trimethoprim Nausea And Vomiting     Med Rec must be completed prior to using this [facility]***       Diagnostic Studies: CUP PACEART REMOTE DEVICE CHECK Result Date: 11/19/2024 ILR summary report received. Battery status OK. Normal device function. No new symptom, tachy, brady, or pause episodes. AT/AF episode per trends  below detection. Monthly summary reports and ROV/PRN.  Presenting rhythm ST ~125 bpm LA CVRS  DG Lumbar Spine 2-3 Views Result Date: 11/17/2024 CLINICAL DATA:  Status post lumbar surgery. EXAM: LUMBAR SPINE - 2-3 VIEW COMPARISON:  Lumbar MRI yesterday FINDINGS: Five non-rib-bearing lumbar vertebra. Broad-based dextroscoliotic curvature. Trace anterolisthesis of L3 on L4. Vertebral body heights are normal, no evidence of fracture or focal bone abnormality. Multilevel disc space narrowing and spurring, greatest at L3-L4. Multilevel facet hypertrophy. The right L4-L5 laminectomy on MRI is not well delineated by radiograph. IMPRESSION: 1. Multilevel degenerative disc disease and facet hypertrophy in the lumbar spine. 2. Trace anterolisthesis of L3 on L4. Broad-based dextroscoliotic  curvature. Electronically Signed   By: Andrea Gasman M.D.   On: 11/17/2024 21:22   MR Lumbar Spine W Wo Contrast Result Date: 11/16/2024 EXAM: MRI LUMBAR SPINE 11/16/2024 05:36:39 PM TECHNIQUE: Multiplanar multisequence MRI of the lumbar spine was performed with and without the administration of intravenous contrast. COMPARISON: Lumbar spine radiographs 08/31/2024 and 09/03/2024. CLINICAL HISTORY: Right hip and leg pain. History of lumbar surgery. FINDINGS: BONES AND ALIGNMENT: 5 lumbar type vertebrae. Mild lumbar dextroscoliosis. Trace anterolisthesis of L3 on L4. No fracture, suspicious marrow lesion, or evidence of discitis. Periarticular edema associated with right L4-L5 facet arthrosis. SPINAL CORD: The conus medullaris terminates at L1 and is normal in signal. SOFT TISSUES: Postoperative changes in the posterior lumbar soft tissues. 1 cm fluid collection in the operative bed on the right at L4-L5, likely postoperative seroma. DISC LEVELS: Disc desiccation and mild to moderate disc space narrowing from L2-L3 through L4-L5. T12-L1: Negative. L1-L2: Normal disc. Mild facet hypertrophy without stenosis. L2-L3: Disc bulging, a  left subarticular to foraminal disc protrusion, and mild to moderate facet hypertrophy result in mild to moderate left lateral recess stenosis and mild to moderate left neural foraminal stenosis without spinal stenosis. L3-L4: Anterolisthesis with diffuse bulging of uncovered disc and severe facet hypertrophy result in moderate right and mild left lateral recess stenosis and mild right and moderate left neural foraminal stenosis without spinal stenosis. L4-L5: Previous right laminectomy and partial facetectomy. Circumferential disc bulging eccentric to the right, a moderately large right foraminal and extraforaminal disc protrusion, and residual moderate right and mild left facet hypertrophy result in moderate to severe right neural foraminal stenosis without spinal stenosis. L5-S1: Negative. IMPRESSION: 1. Right foraminal and extraforaminal disc protrusion at L4-L5 with moderate to severe right neural foraminal stenosis. 2. Moderate right and mild left lateral recess stenosis and mild right and moderate left neural foraminal stenosis at L3-L4. 3. Mild to moderate left lateral recess and left neural foraminal stenosis at L2-3. Electronically signed by: Dasie Hamburg MD 11/16/2024 06:03 PM EST RP Workstation: [id number]        Discharge Plan:  discharge to CIR vs. Home w/ HHPT  Disposition:  Debbie Hatfield is a very pleasant 69 year old female who is POD *** from TLIF L4-L5. Surgical intervention was successful and without complications. Her hospital course has been uncomplicated. She states that her pre-operative leg pain is much improved since surgery. She is ambulating on her own. she is tolerating oral intake well. She is compliant with the LSO brace. She is complaint with the incentive spirometer. She reports that her pain is well controled with oral pain medications. Dressing is c/d/I. Positive void, positive flatus, positive BM. Patient will follow up with us  in clinic in 2 weeks. Patient understands that she  cannot shower for the first 5 days post-op. All questions were welcomed and addressed.    Signed: ***Dr. Donaciano Sprang [facility] [phone] 11/20/2024, 12:58 PM      "

## 2024-11-20 NOTE — Plan of Care (Signed)

## 2024-11-20 NOTE — Progress Notes (Signed)
 Expand All Collapse All       Subjective: Patient reports pain as 7 on 0-10 scale.  Reports increased leg pain reports significant back pain   Positive void Negative bowel movement Positive flatus Negative chest pain or shortness of breath   Physical Exam: Intact pulses distally Compartment soft Positive right femoral stretch test. Persistent weakness of the right quad and tibialis anterior (4/5) Body mass index is 27.88 kg/m.     Assessment/Plan: Patient stable  Unfortunately she continues to have severe radiculopathy and weakness related to the recurrent L4-5 disc herniation.  The disc herniation is producing severe foraminal and lateral recess stenosis which is affecting both the exiting L4 and L5 nerve root.  She has trace weakness in these dermatomes as well as a positive nerve root tension sign. At this point since she is going to have surgical intervention I am changing her from an observation to inpatient status. We will change her DVT prophylaxis from Lovenox  to teds and SCDs. Patient reports constipation and so we will give her a fleets enema today. Plan on surgical intervention tomorrow afternoon. Surgical plan: TLIF L4-5. I have again gone over the risks benefits and alternatives to surgery and she is expressed an understanding of the surgery as well as the goals.  No change from yesterday

## 2024-11-20 NOTE — Op Note (Signed)
 OPERATIVE REPORT  DATE OF SURGERY: 11/20/2024  PATIENT NAME:  Debbie Hatfield MRN: [id number] DOB: Jul 20, 1956  PCP: Joshua Debby CROME, MD  PRE-OPERATIVE DIAGNOSIS: Recurrent right lumbar disc herniation L4-5 with foraminal and lateral recess stenosis producing compression of the exiting L4 and L5 nerve root.  POST-OPERATIVE DIAGNOSIS: Same  PROCEDURE:   Revision lumbar decompression. TLIF L4-5 with posterior lateral arthrodesis Amberg  SURGEON:  Donaciano Sprang, MD  PHYSICIAN ASSISTANT: Jeoffrey Sages, PA  ANESTHESIA:   General  EBL: 500 ml   Complications: During implantation of the right cortical pedicle screw of L5 there was a medial breach of the pedicle.  Traditional lateral transpedicular approach was then taken to salvage the screw.  Implants: NuVasive expandable intervertebral  (TLIF) cage: 7 x 9 x 24 mm.  Expanded to a final anterior height of 11 mm and posterior of 9 mm. NuVasive cortical pedicle screws: 5.5 x 40 mm length L4 bilaterally.  5.5 x 40 mm length right L5 and 6.5 x 35 mm left L5.  Neuromonitoring.  At the conclusion of the case SSEP and evoked motor potentials were normal.  During the case there was some periods of L5 and L4 irritation secondary to the decompression but this resolved.  L4 screws were directly stimulated and there was no activity at greater than 20 mA.  L5 screws were directly stimulated no activity at the 15 mA.  Graft: Graft from decompression mixed with DBX mix  BRIEF HISTORY: Debbie Hatfield is a 69 y.o. female who underwent a previous lumbar discectomy at L4-5 on the right side a few months ago.  The patient was shoveling snow last weekend and experienced acute onset of severe back and recurrent right leg pain.  Patient was brought to the emergency room and admitted because of pain and motor deficits.  Imaging showed a recurrent L4-5 disc herniation with a new fragment extending into the foramen.  This was causing severe lateral recess and foraminal  stenosis.  As result of the neurological deficit, recurrent disc herniation, and a severe patient elected to return to the OR for revision decompression and fusion.  Risks, benefits, alternatives were discussed with the patient and consent was obtained.  PROCEDURE DETAILS: Patient was brought into the operating room and was properly positioned on the operating room table.  After induction with general anesthesia the patient was endotracheally intubated.  A timeout was taken to confirm all important data: including patient, procedure, and the level. Teds, SCD's were applied.   Foley was placed by the nurse, and the neuromonitoring rep placed all appropriate needles for intraoperative SSEP and EMG monitoring  The patient was then turned prone onto the [facility] spine frame and all bony prominences were well-padded.  The back was then prepped and draped in a standard fashion.  The previous incision site was marked and infiltrated with quarter percent Marcaine  with epinephrine .  It was reincised and extended somewhat caudally and cranially to improve visualization.  Sharp dissection was carried out down to the deep fascia.  The deep fascia was sharply incised and starting on the left side I stripped the paraspinal muscles to expose the lamina of L5 and L4 as well as the L3-4 and L4-5 facet complexes.  Once we had the left side exposed I then exposed the right side.  I started cranially until I could visualize the remaining portion of the L4 spinous process and lamina.  Using a curette I was able to debulk the extensive scar tissue that had  formed.  This was facilitated by having expose the L5 and the L4 spinous process and lamina.  Once I knew my bony landmarks I was able to remove the scar tissue to expose the facet capsules of L3-4 and L4-5.  Once I had the posterior anatomy exposed I placed a Penfield 4 under the left L4 lamina and confirmed that I was at the appropriate level.  Using a high-speed bur and AP  fluoroscopy I identified the inferior medial border of the L4 pedicle.  I approach the cortex and using my pedicle awl advanced towards the superior lateral portion of the pedicle.  As I was in the mid to the lateral aspect of the pedicle on the AP view I switched to the lateral view to confirm that I was just at the posterior wall of the vertebral body.  Once I confirm my trajectory advanced into the vertebral body.  I removed the awl and then palpated the hole with a ball-tipped feeler to ensure its integrity.  I then tapped the hole and repalpated with a ball-tipped feeler.  I then placed the 5.5 x 40 mm screw.  It had excellent purchase.  Using this exact same technique I placed the screw on the contralateral L4 pedicle.  I then exposed the L5 pedicle on the left side and using the same technique I placed the left L5 pedicle.  It was difficult to visualize the L5 pedicle on the right and so I elected to move forward with the decompression and placed the pedicle screw after I completed the discectomy.     A left sided laminotomy was performed with a Kerrison rongeur.  I then was able to mobilize the ligamentum flavum and remove it with a Kerrison rongeur.  I now could see the thecal sac.  Working from the left side lumbar to the right I was able to create my plane and elevate the scar tissue off the thecal sac.  I removed the scar and the ligamentum flavum with Kerrison rongeurs and began working my way over into the right lateral recess.  Using a Penfield 4 I was able to gently dissect through the scar into the lateral recess.  I then performed a medial facetectomy and the L5 nerve root came into view.  I can now also see the L4 foramen.  Using the Kerrison rongeurs I began a medial foraminotomy of L4.  The L4 nerve root then came into view.  To confirm it was the L4 nerve root I stimulated just near and had a positive response.  I then gently dissected on the posterior surface of the nerve root removing the  bony pars and facet capsule.  This allowed some more mobility to the nerve root.  Once this was completed I swept underneath the nerve root and delivered a large disc herniation consistent with what was seen on the preoperative MRI.  I then placed a neuro patty to protect the nerve root and visualized the posterior annulus.  I then identified the L5 nerve root and began gently dissecting between the pedicle and the nerve root.  I was open able to perform a medial foraminotomy.  I could now visualize from the medial aspect of the L5 pedicle all the way to the inferior aspect of the L4 pedicle.  The L4 nerve root was very mobile and I could easily pass my nerve hook underneath the L4 nerve root and medially along the pedicle and underneath the annulus.  With the [facility] decompression  complete I then proceeded with the discectomy and placement of the intervertebral cage.  An annulotomy was performed with a 15 blade scalpel.  Then using a combination of sidecutting and reverse curettes as well as pituitary rongeurs I was able to remove the bulk of the disc material.  I continued to use curettes to work across the midline to the contralateral side.  Once I had removed the disc material I could then sweep underneath the L4 nerve root and deliver some additional fragments of disc material.  I then used my Epstein curettes to further decompress the L4 nerve root in the foramen.  I could now more easily mobilize the L5 nerve root.  I could sweep immediately and pass my nerve hook along the medial border of the pedicle into the L5 foramen.  I could also sweep underneath the nerve root and thecal sac confirming satisfactory decompression of the L5 nerve root.  With the discectomy completed I then rasped the endplates and placed my trial implants.  I elected to use the small expandable cage.  After copious irrigation nerve retractor was placed to protect the thecal sac and exiting L4 nerve root.  The expandable cage was then  placed and advanced into the disc space.  I then angled it so it would cross the midline and and be centered.  Once the cage was countersunk, I expanded the anterior and posterior margins to the had excellent contact with the endplates.  I then confirmed with AP lateral fluoroscopy that I had satisfactory positioning of the cage.  At this point I then backfilled the cage with the DBX mix and I placed autograft bone lateral to the cage in the disc space.  With the intervertebral cage position I then moved forward with my left posterior lateral arthrodesis and pedicle screw rod construct.  Using a Bovie I have removed the facet capsule at L4-5 and then used a high-speed bur to take down the facet capsule and facet joint as well as the L4 transverse process.  I then placed autograft bone into the posterior lateral recess.  The polyaxial heads were then attached to the screws and then I directly stimulated each of the screws to confirm there was no adverse EMG activity to suggest breach and neural irritation.  The appropriate size rod was then secured in the locking caps attached.  They were then tightened/final torqued according manufacture standards.  I then turned my attention to the right L5 pedicle.  Using direct anatomical landmarks I placed my bur on the inferior medial aspect of the pedicle and breach the cortex I then advanced the pedicle awl aiming towards the superior lateral aspect of the pedicle.  While placing it I noted that the medial wall of the pedicle was breached.  Although the nerve root was not compromised I elected to repeat position this pedicle screw using a more traditional lateral starting spot.  I then identified the L5 transverse process and the lateral border of the facet complex at L4-5.  I then advanced all through the pedicle and into the vertebral body.  I directly visualized the medial portion of the pedicle that was still intact and there is no evidence of a breach.  I removed the  pedicle awl and I directly palpated down the hole and it was intact.  I then placed my screw.  The screw had excellent purchase.  I then actively stimulated the 2 screws on the right side and there was no activity to suggest  breach and neural irritation.  At this point I copiously irrigated the wound with normal saline.  I then used bipolar cautery to obtain hemostasis.  After confirming hemostasis I placed a thrombin -soaked Gelfoam patty over the annulotomy site and then placed 40 mg of Depo-Medrol  over the nerve root to aid in postoperative analgesia.  I then placed a thrombin -soaked Gelfoam patty over the laminotomy defect.  Retractors were removed and final x-rays were done.  They demonstrate satisfactory positioning of the intervertebral cage and the posterior pedicle screw construct.  The deep fascia was then closed with interrupted #1 Vicryl sutures followed by interrupted 2-0 Vicryl sutures and finally a 3-0 Monocryl for the skin.  Steri-Strips and dry dressings were applied and the patient was ultimately extubated and transferred to the PACU without incident.  The end of the case all needle sponge counts were correct.  Donaciano Sprang, MD 11/20/2024 6:07 PM

## 2024-11-20 NOTE — Brief Op Note (Signed)
 06/18/2024   10:23 AM   PATIENT:  11/20/2024  6:30 PM  PATIENT:  Debbie Hatfield  70 y.o. female  PRE-OPERATIVE DIAGNOSIS:  recurrent disc herniation L4-L5 spinal stenosis of lumbar region with radiculopathy  POST-OPERATIVE DIAGNOSIS:  recurrent disc herniation L4-L5spinal stenosis of lumbar region with radiculopathy  PROCEDURE:  Procedures with comments: TRANSFORAMINAL LUMBAR INTERBODY FUSION (TLIF) WITH PEDICLE SCREW FIXATION 1 LEVEL (N/A) - transforaminal lumbar interbody fusion L4-L5 and posterior instrumentation  SURGEON:  Surgeons and Role:    DEWAINE Burnetta Aures, MD - Primary  PHYSICIAN ASSISTANT: Jeoffrey Sages, PA  ANESTHESIA:   general  EBL:  500 mL   BLOOD ADMINISTERED:none  DRAINS: none   LOCAL MEDICATIONS USED:  MARCAINE      SPECIMEN:  No Specimen  DISPOSITION OF SPECIMEN:  N/A  COUNTS:  YES  TOURNIQUET:  * No tourniquets in log *  DICTATION: .Dragon Dictation  PLAN OF CARE: Admit to inpatient   PATIENT DISPOSITION:  PACU - hemodynamically stable.

## 2024-11-20 NOTE — Transfer of Care (Signed)
 Immediate Anesthesia Transfer of Care Note  Patient: Debbie Hatfield  Procedure(s) Performed: TRANSFORAMINAL LUMBAR INTERBODY FUSION (TLIF) WITH PEDICLE SCREW FIXATION 1 LEVEL  Patient Location: PACU  Anesthesia Type:General  Level of Consciousness: drowsy and patient cooperative  Airway & Oxygen Therapy: Patient Spontanous Breathing and Patient connected to face mask oxygen  Post-op Assessment: Report given to RN and Post -op Vital signs reviewed and stable  Post vital signs: Reviewed and stable  Last Vitals:  Vitals Value Taken Time  BP 140/99 11/20/24 18:45  Temp    Pulse 110 11/20/24 18:46  Resp 16 11/20/24 18:46  SpO2 98 % 11/20/24 18:46  Vitals shown include unfiled device data.  Last Pain:  Vitals:   11/20/24 1154  TempSrc: Oral  PainSc: 4       Patients Stated Pain Goal: 4 (11/20/24 1154)  Complications: No notable events documented.

## 2024-11-20 NOTE — Anesthesia Preprocedure Evaluation (Addendum)
"                                    Anesthesia Evaluation  Patient identified by MRN, date of birth, ID band Patient awake    Reviewed: Allergy & Precautions, NPO status , Patient's Chart, lab work & pertinent test results, reviewed documented beta blocker date and time   History of Anesthesia Complications (+) PONV and history of anesthetic complications  Airway Mallampati: III  TM Distance: >3 FB     Dental no notable dental hx.    Pulmonary neg COPD   breath sounds clear to auscultation       Cardiovascular hypertension, (-) angina (-) CAD, (-) Past MI and (-) Cardiac Stents  Rhythm:Regular Rate:Normal     Neuro/Psych  PSYCHIATRIC DISORDERS Anxiety Depression     Neuromuscular disease CVA, No Residual Symptoms    GI/Hepatic hiatal hernia, PUD,GERD  ,,  Endo/Other    Renal/GU CRFRenal disease     Musculoskeletal  (+) Arthritis ,    Abdominal   Peds  Hematology  (+) Blood dyscrasia, anemia   Anesthesia Other Findings   Reproductive/Obstetrics                              Anesthesia Physical Anesthesia Plan  ASA: 2  Anesthesia Plan: General   Post-op Pain Management:    Induction: Intravenous  PONV Risk Score and Plan: 4 or greater and Propofol  infusion, Dexamethasone  and Ondansetron   Airway Management Planned: Oral ETT  Additional Equipment:   Intra-op Plan:   Post-operative Plan: Extubation in OR  Informed Consent: I have reviewed the patients History and Physical, chart, labs and discussed the procedure including the risks, benefits and alternatives for the proposed anesthesia with the patient or authorized representative who has indicated his/her understanding and acceptance.    Discussed DNR with patient.   Dental advisory given  Plan Discussed with: CRNA  Anesthesia Plan Comments: (No chest compressions. Ok with cardioversion, medicines, intubation, blood transfusion)         Anesthesia  Quick Evaluation  "

## 2024-11-20 NOTE — Progress Notes (Addendum)
 " PROGRESS NOTE    Debbie Hatfield  FMW:[id number] DOB: 23-Feb-1956 DOA: 11/16/2024 PCP: Joshua Debby CROME, MD   Brief Narrative: 69 year old with past medical history significant for systemic lupus, hypertension, osteoarthritis, CKD stage IIIa, IBS, MDD, GAD who presents to the ED 09/16/19/2026 due to back pain.  Patient reports excruciating back pain that started 2 days prior after she was shoveling snow at home.  She had undergone right L4-L5 microdissecting approximately 3 months ago with complete resolution of her sciatic symptoms.  MRI lumbar spine showed right foraminal and extraforaminal disc protrusion at L4-L5 with moderate to severe right neuroforaminal stenosis.  Moderate right and minimal left lateral recess stenosis and mild right and moderate left neural foraminal stenosis L3-L4.  Mild to moderate left lateral recess and left neural foraminal stenosis L2-L3. Initial recommendation from orthopedic was prednisone  taper and follow-up as an outpatient but patient has uncontrolled pain and inability to ambulate and require rehab.  Ortho reconsulted and plan is for surgery on Friday   Assessment & Plan:   Principal Problem:   Protrusion of lumbar intervertebral disc Active Problems:   Irritable bowel disease   Hypertension   Gastroparesis   CKD stage 3a, GFR 45-59 ml/min (HCC)   History of CVA (cerebrovascular accident)   History of musculoskeletal disease   Spinal stenosis at L4-L5 level   History of depression   History of sinus bradycardia   History of systemic lupus erythematosus (SLE) (HCC)   Osteoarthritis of right hip   Lumbar radiculopathy, acute   1-Lumbar intervertebral disc protrusion with L4 radiculopathy Patient with inability to ambulate due to pain and weakness , plan is to proceed with Sx on Friday.  MRI lumbar spine:showed right foraminal and extraforaminal disc protrusion L4-L5 with moderate to severe right neural foraminal stenosis.  On prednisone , no significant  improvement. She has been requiring IV Dilaudid  Unable to see patient she has been out of unit and in the OR>  No charge  CKD 3 A Continue to monitor renal function  History of CVA Holding Plavix  for surgical procedure on Friday  Hypertension Continue amlodipine  and Avapro   Depression Continue Cymbalta  and Seroquel   SLE Needs follow-up with rheumatology as an outpatient   Estimated body mass index is 27.88 kg/m as calculated from the following:   Height as of this encounter: 5' 2 (1.575 m).   Weight as of this encounter: 69.1 kg.   DVT prophylaxis:  Code Status: Plan to continue with full code, patient does not wish to be on life support for a long.  Of time Family Communication: Discussed with patient Disposition Plan:  Status is: Observation The patient remains OBS appropriate and will d/c before 2 midnights.    Consultants:  Ortho  Procedures:  Back surgery  Antimicrobials:    Subjective: Patient in the OR.   Objective: Vitals:   11/19/24 1959 11/20/24 0006 11/20/24 0436 11/20/24 0729  BP: (!) 148/75 137/70 (!) 162/86 (!) 147/71  Pulse: 100 98 95 89  Resp:      Temp: 98.2 F (36.8 C) 97.9 F (36.6 C) (!) 97.5 F (36.4 C) 97.6 F (36.4 C)  TempSrc:      SpO2: 97% 96% 97% 98%  Weight:      Height:        Intake/Output Summary (Last 24 hours) at 11/20/2024 0753 Last data filed at 11/19/2024 2100 Gross per 24 hour  Intake 3 ml  Output --  Net 3 ml   [employer]  11/16/24 1436  Weight: 69.1 kg    Examination:  NA   Data Reviewed: I have personally reviewed following labs and imaging studies  CBC: Recent Labs  Lab 11/16/24 2010 11/17/24 0426 11/18/24 0352  WBC 7.8 5.5 12.9*  HGB 11.9* 11.3* 10.7*  HCT 37.0 35.5* 33.9*  MCV 92.3 93.7 93.9  PLT 477* 455* 441*   Basic Metabolic Panel: Recent Labs  Lab 11/16/24 2010 11/17/24 0426 11/18/24 0352 11/19/24 0740  NA 140 140 141 142  K 4.8 5.0 4.9 4.7  CL 109 108 107 107   CO2 20* 22 24 27   GLUCOSE 182* 167* 150* 95  BUN 25* 28* 26* 25*  CREATININE 1.27* 1.20* 1.08* 1.11*  CALCIUM  8.9 8.8* 8.8* 8.4*   GFR: Estimated Creatinine Clearance: 44.2 mL/min (A) (by C-G formula based on SCr of 1.11 mg/dL (H)). Liver Function Tests: No results for input(s): AST, ALT, ALKPHOS, BILITOT, PROT, ALBUMIN  in the last 168 hours. No results for input(s): LIPASE, AMYLASE in the last 168 hours. No results for input(s): AMMONIA in the last 168 hours. Coagulation Profile: No results for input(s): INR, PROTIME in the last 168 hours. Cardiac Enzymes: No results for input(s): CKTOTAL, CKMB, CKMBINDEX, TROPONINI in the last 168 hours. BNP (last 3 results) No results for input(s): PROBNP in the last 8760 hours. HbA1C: No results for input(s): HGBA1C in the last 72 hours. CBG: No results for input(s): GLUCAP in the last 168 hours. Lipid Profile: No results for input(s): CHOL, HDL, LDLCALC, TRIG, CHOLHDL, LDLDIRECT in the last 72 hours. Thyroid  Function Tests: No results for input(s): TSH, T4TOTAL, FREET4, T3FREE, THYROIDAB in the last 72 hours. Anemia Panel: No results for input(s): VITAMINB12, FOLATE, FERRITIN, TIBC, IRON, RETICCTPCT in the last 72 hours. Sepsis Labs: No results for input(s): PROCALCITON, LATICACIDVEN in the last 168 hours.  Recent Results (from the past 240 hours)  Surgical pcr screen     Status: None   Collection Time: 11/19/24  4:20 PM   Specimen: Nasal Mucosa; Nasal Swab  Result Value Ref Range Status   MRSA, PCR NEGATIVE NEGATIVE Final   Staphylococcus aureus NEGATIVE NEGATIVE Final    Comment: (NOTE) The Xpert SA Assay (FDA approved for NASAL specimens in patients 36 years of age and older), is one component of a comprehensive surveillance program. It is not intended to diagnose infection nor to guide or monitor treatment. Performed at [facility], [address] [place], [place] [address]          Radiology Studies: CUP PACEART REMOTE DEVICE CHECK Result Date: 11/19/2024 ILR summary report received. Battery status OK. Normal device function. No new symptom, tachy, brady, or pause episodes. AT/AF episode per trends below detection. Monthly summary reports and ROV/PRN.  Presenting rhythm ST ~125 bpm LA CVRS       Scheduled Meds:  amLODipine   5 mg Oral Daily   doxepin   75 mg Oral QHS   DULoxetine   90 mg Oral Daily   gabapentin   600 mg Oral BID   irbesartan   75 mg Oral Daily   pantoprazole   40 mg Oral BID AC   polyethylene glycol  17 g Oral Daily   senna-docusate  1 tablet Oral BID   sodium chloride  flush  3 mL Intravenous Q12H   sodium chloride  flush  3 mL Intravenous Q12H   Continuous Infusions:   ceFAZolin  (ANCEF ) IV       LOS: 1 day    Time spent: 35 Minutes  Debbie DELENA Lore, MD [facility]   If 7PM-7AM, please contact night-coverage [email or web address]  11/20/2024, 7:53 AM   "

## 2024-11-20 NOTE — Anesthesia Procedure Notes (Signed)
 Procedure Name: Intubation Date/Time: 11/20/2024 1:35 PM  Performed by: Keneth Lynwood POUR, MDPre-anesthesia Checklist: Patient identified, Emergency Drugs available, Suction available and Patient being monitored Patient Re-evaluated:Patient Re-evaluated prior to induction Oxygen Delivery Method: Circle System Utilized Preoxygenation: Pre-oxygenation with 100% oxygen Induction Type: IV induction Ventilation: Mask ventilation without difficulty Laryngoscope Size: Mac and 3 Grade View: Grade I Tube type: Oral Tube size: 7.0 mm Number of attempts: 1 Airway Equipment and Method: Stylet and Oral airway Placement Confirmation: ETT inserted through vocal cords under direct vision, positive ETCO2 and breath sounds checked- equal and bilateral Secured at: 22 cm Tube secured with: Tape Dental Injury: Teeth and Oropharynx as per pre-operative assessment

## 2024-11-20 NOTE — Progress Notes (Signed)
" ° ° ° °  Subjective: Procedures (LRB): TRANSFORAMINAL LUMBAR INTERBODY FUSION (TLIF) WITH PEDICLE SCREW FIXATION 1 LEVEL (N/A)    Patient reports pain as 7 on 0-10 scale.  Reports increased leg pain reports significant back pain   Positive void Negative bowel movement Positive flatus Negative chest pain or shortness of breath  Objective: Vital signs in last 24 hours: Temp:  [97.5 F (36.4 C)-98.2 F (36.8 C)] 97.5 F (36.4 C) (02/06 0436) Pulse Rate:  [81-101] 95 (02/06 0436) Resp:  [20] 20 (02/05 1621) BP: (127-162)/(56-86) 162/86 (02/06 0436) SpO2:  [96 %-100 %] 97 % (02/06 0436)  Intake/Output from previous day: 02/05 0701 - 02/06 0700 In: 3 [I.V.:3] Out: -   Labs: Recent Labs    11/18/24 0352  WBC 12.9*  RBC 3.61*  HCT 33.9*  PLT 441*   Recent Labs    11/18/24 0352 11/19/24 0740  NA 141 142  K 4.9 4.7  CL 107 107  CO2 24 27  BUN 26* 25*  CREATININE 1.08* 1.11*  GLUCOSE 150* 95  CALCIUM  8.8* 8.4*   No results for input(s): LABPT, INR in the last 72 hours.  Physical Exam: Intact pulses distally Compartment soft Positive right femoral stretch test. Persistent weakness of the right quad and tibialis anterior (4/5) Body mass index is 27.88 kg/m.   Assessment/Plan: Patient stable  Debbie Hatfield is a very pleasant 69 year old woman who had a prior right L4-L5 microdiscectomy  3 months ago with Dr. Duwayne and now unfortunately after shoveling snow has a recurrent foraminal./extra foraminal disc herniation at L4-L5 on the right side. Plan is to proceed with TLIF L4-L5 this afternoon. I have again gone over the risks benefits and alternatives to surgery and she is expressed an understanding of the surgery as well as the goals.    Patient stable  xrays n/a Continue mobilization  Continue care   [employer] PA-C for Dr. Donaciano Sprang, MD [facility] [phone]  "

## 2024-11-26 ENCOUNTER — Ambulatory Visit

## 2024-12-16 ENCOUNTER — Ambulatory Visit (HOSPITAL_COMMUNITY): Admit: 2024-12-16 | Admitting: Orthopedic Surgery

## 2024-12-20 ENCOUNTER — Ambulatory Visit

## 2025-02-04 ENCOUNTER — Encounter (HOSPITAL_COMMUNITY): Payer: Self-pay | Admitting: Physician Assistant

## 2025-02-04 ENCOUNTER — Encounter (HOSPITAL_COMMUNITY): Payer: Self-pay

## 2025-02-04 ENCOUNTER — Other Ambulatory Visit (HOSPITAL_COMMUNITY)

## 2025-02-04 ENCOUNTER — Ambulatory Visit (HOSPITAL_COMMUNITY): Admit: 2025-02-04

## 2025-02-04 SURGERY — Surgical Case
Anesthesia: *Unknown

## 2025-02-04 SURGERY — MRI WITH ANESTHESIA
Anesthesia: General | Laterality: Right

## 2025-02-11 ENCOUNTER — Ambulatory Visit: Admitting: Dermatology

## 2025-03-01 ENCOUNTER — Telehealth (HOSPITAL_COMMUNITY): Admitting: Family

## 2025-05-03 ENCOUNTER — Ambulatory Visit: Admitting: Physician Assistant

## 2025-05-13 ENCOUNTER — Ambulatory Visit
